# Patient Record
Sex: Male | Born: 1935
Health system: Southern US, Community
[De-identification: ages and names within clinical notes are randomized; demographics above are authoritative.]

## PROBLEM LIST (undated history)

## (undated) DIAGNOSIS — E785 Hyperlipidemia, unspecified: Secondary | ICD-10-CM

## (undated) DIAGNOSIS — I2510 Atherosclerotic heart disease of native coronary artery without angina pectoris: Secondary | ICD-10-CM

## (undated) DIAGNOSIS — I255 Ischemic cardiomyopathy: Secondary | ICD-10-CM

## (undated) DIAGNOSIS — I350 Nonrheumatic aortic (valve) stenosis: Secondary | ICD-10-CM

## (undated) DIAGNOSIS — K219 Gastro-esophageal reflux disease without esophagitis: Secondary | ICD-10-CM

## (undated) DIAGNOSIS — I2699 Other pulmonary embolism without acute cor pulmonale: Secondary | ICD-10-CM

## (undated) DIAGNOSIS — R05 Cough: Secondary | ICD-10-CM

## (undated) DIAGNOSIS — K222 Esophageal obstruction: Secondary | ICD-10-CM

## (undated) DIAGNOSIS — M1990 Unspecified osteoarthritis, unspecified site: Secondary | ICD-10-CM

## (undated) DIAGNOSIS — K5792 Diverticulitis of intestine, part unspecified, without perforation or abscess without bleeding: Secondary | ICD-10-CM

## (undated) DIAGNOSIS — I219 Acute myocardial infarction, unspecified: Secondary | ICD-10-CM

## (undated) DIAGNOSIS — I4891 Unspecified atrial fibrillation: Secondary | ICD-10-CM

## (undated) DIAGNOSIS — C61 Malignant neoplasm of prostate: Secondary | ICD-10-CM

## (undated) DIAGNOSIS — I472 Ventricular tachycardia, unspecified: Secondary | ICD-10-CM

## (undated) DIAGNOSIS — R011 Cardiac murmur, unspecified: Secondary | ICD-10-CM

## (undated) DIAGNOSIS — I4821 Permanent atrial fibrillation: Secondary | ICD-10-CM

## (undated) DIAGNOSIS — G959 Disease of spinal cord, unspecified: Secondary | ICD-10-CM

## (undated) DIAGNOSIS — I509 Heart failure, unspecified: Secondary | ICD-10-CM

## (undated) DIAGNOSIS — R54 Age-related physical debility: Secondary | ICD-10-CM

## (undated) DIAGNOSIS — R059 Cough, unspecified: Secondary | ICD-10-CM

## (undated) DIAGNOSIS — M810 Age-related osteoporosis without current pathological fracture: Secondary | ICD-10-CM

## (undated) DIAGNOSIS — I639 Cerebral infarction, unspecified: Secondary | ICD-10-CM

## (undated) DIAGNOSIS — Z95810 Presence of automatic (implantable) cardiac defibrillator: Secondary | ICD-10-CM

## (undated) DIAGNOSIS — Z7901 Long term (current) use of anticoagulants: Secondary | ICD-10-CM

## (undated) DIAGNOSIS — I5022 Chronic systolic (congestive) heart failure: Secondary | ICD-10-CM

## (undated) DIAGNOSIS — T17908A Unspecified foreign body in respiratory tract, part unspecified causing other injury, initial encounter: Secondary | ICD-10-CM

## (undated) DIAGNOSIS — E871 Hypo-osmolality and hyponatremia: Secondary | ICD-10-CM

## (undated) HISTORY — PX: BACK SURGERY: SHX140

## (undated) HISTORY — DX: Unspecified osteoarthritis, unspecified site: M19.90

## (undated) HISTORY — DX: Nonrheumatic aortic (valve) stenosis: I35.0

## (undated) HISTORY — PX: OTHER SURGICAL HISTORY: SHX169

## (undated) HISTORY — DX: Other pulmonary embolism without acute cor pulmonale: I26.99

## (undated) HISTORY — DX: Gastro-esophageal reflux disease without esophagitis: K21.9

## (undated) HISTORY — DX: Ischemic cardiomyopathy: I25.5

## (undated) HISTORY — PX: AORTIC VALVE REPLACEMENT: SHX41

## (undated) HISTORY — DX: Acute myocardial infarction, unspecified: I21.9

## (undated) HISTORY — DX: Long term (current) use of anticoagulants: Z79.01

## (undated) HISTORY — PX: CHOLECYSTECTOMY: SHX55

## (undated) HISTORY — DX: Hyperlipidemia, unspecified: E78.5

## (undated) HISTORY — DX: Disease of spinal cord, unspecified: G95.9

## (undated) HISTORY — DX: Esophageal obstruction: K22.2

## (undated) HISTORY — DX: Unspecified atrial fibrillation: I48.91

## (undated) HISTORY — DX: Malignant neoplasm of prostate: C61

## (undated) HISTORY — DX: Ventricular tachycardia: I47.2

## (undated) HISTORY — DX: Diverticulitis of intestine, part unspecified, without perforation or abscess without bleeding: K57.92

## (undated) HISTORY — PX: SHOULDER SURGERY: SHX246

---
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
## 1977-03-13 HISTORY — PX: CORONARY ARTERY BYPASS GRAFT: SHX141

## 1989-03-13 HISTORY — PX: CORONARY ARTERY BYPASS GRAFT: SHX141

## 1997-09-29 ENCOUNTER — Other Ambulatory Visit: Admission: RE | Admit: 1997-09-29 | Discharge: 1997-09-29 | Payer: Self-pay | Admitting: Oncology

## 1997-11-05 ENCOUNTER — Ambulatory Visit (HOSPITAL_BASED_OUTPATIENT_CLINIC_OR_DEPARTMENT_OTHER): Admission: RE | Admit: 1997-11-05 | Discharge: 1997-11-05 | Payer: Self-pay | Admitting: Orthopedic Surgery

## 1998-03-25 ENCOUNTER — Ambulatory Visit (HOSPITAL_BASED_OUTPATIENT_CLINIC_OR_DEPARTMENT_OTHER): Admission: RE | Admit: 1998-03-25 | Discharge: 1998-03-25 | Payer: Self-pay | Admitting: Orthopedic Surgery

## 2001-04-12 ENCOUNTER — Ambulatory Visit (HOSPITAL_COMMUNITY): Admission: RE | Admit: 2001-04-12 | Discharge: 2001-04-12 | Payer: Self-pay | Admitting: Cardiology

## 2001-05-02 ENCOUNTER — Ambulatory Visit (HOSPITAL_COMMUNITY): Admission: RE | Admit: 2001-05-02 | Discharge: 2001-05-03 | Payer: Self-pay | Admitting: Internal Medicine

## 2001-05-03 ENCOUNTER — Encounter: Payer: Self-pay | Admitting: Internal Medicine

## 2003-07-06 ENCOUNTER — Inpatient Hospital Stay (HOSPITAL_COMMUNITY): Admission: RE | Admit: 2003-07-06 | Discharge: 2003-07-06 | Payer: Self-pay | Admitting: Internal Medicine

## 2004-02-19 ENCOUNTER — Encounter: Admission: RE | Admit: 2004-02-19 | Discharge: 2004-02-19 | Payer: Self-pay | Admitting: Sports Medicine

## 2004-03-17 ENCOUNTER — Ambulatory Visit: Payer: Self-pay | Admitting: Internal Medicine

## 2004-08-24 ENCOUNTER — Ambulatory Visit: Payer: Self-pay

## 2004-11-22 ENCOUNTER — Ambulatory Visit: Payer: Self-pay | Admitting: Internal Medicine

## 2004-11-29 ENCOUNTER — Ambulatory Visit (HOSPITAL_COMMUNITY): Admission: RE | Admit: 2004-11-29 | Discharge: 2004-11-29 | Payer: Self-pay | Admitting: Orthopedic Surgery

## 2005-02-07 ENCOUNTER — Ambulatory Visit: Payer: Self-pay | Admitting: Internal Medicine

## 2005-05-16 ENCOUNTER — Ambulatory Visit: Payer: Self-pay | Admitting: Internal Medicine

## 2005-08-17 ENCOUNTER — Ambulatory Visit: Payer: Self-pay

## 2005-11-22 ENCOUNTER — Ambulatory Visit: Payer: Self-pay | Admitting: Internal Medicine

## 2006-01-31 ENCOUNTER — Ambulatory Visit: Payer: Self-pay | Admitting: Internal Medicine

## 2006-02-16 ENCOUNTER — Encounter: Admission: RE | Admit: 2006-02-16 | Discharge: 2006-02-16 | Payer: Self-pay | Admitting: Otolaryngology

## 2006-07-10 ENCOUNTER — Encounter: Admission: RE | Admit: 2006-07-10 | Discharge: 2006-07-10 | Payer: Self-pay | Admitting: Cardiology

## 2006-09-07 ENCOUNTER — Ambulatory Visit: Payer: Self-pay

## 2006-09-10 ENCOUNTER — Ambulatory Visit (HOSPITAL_COMMUNITY): Admission: RE | Admit: 2006-09-10 | Discharge: 2006-09-10 | Payer: Self-pay | Admitting: Urology

## 2006-10-02 ENCOUNTER — Ambulatory Visit: Admission: RE | Admit: 2006-10-02 | Discharge: 2006-12-28 | Payer: Self-pay | Admitting: Radiation Oncology

## 2006-11-16 LAB — URINALYSIS, MICROSCOPIC - CHCC
Bilirubin (Urine): NEGATIVE
Ketones: NEGATIVE mg/dL

## 2006-11-17 LAB — URINE CULTURE

## 2006-12-28 ENCOUNTER — Ambulatory Visit: Admission: RE | Admit: 2006-12-28 | Discharge: 2007-02-10 | Payer: Self-pay | Admitting: Radiation Oncology

## 2007-01-30 ENCOUNTER — Ambulatory Visit: Payer: Self-pay | Admitting: Internal Medicine

## 2007-04-30 ENCOUNTER — Ambulatory Visit: Payer: Self-pay | Admitting: Internal Medicine

## 2007-07-30 ENCOUNTER — Ambulatory Visit: Payer: Self-pay | Admitting: Internal Medicine

## 2007-09-02 ENCOUNTER — Inpatient Hospital Stay (HOSPITAL_COMMUNITY): Admission: AD | Admit: 2007-09-02 | Discharge: 2007-09-05 | Payer: Self-pay | Admitting: Cardiology

## 2007-10-29 ENCOUNTER — Ambulatory Visit: Payer: Self-pay | Admitting: Internal Medicine

## 2007-11-03 ENCOUNTER — Inpatient Hospital Stay (HOSPITAL_COMMUNITY): Admission: EM | Admit: 2007-11-03 | Discharge: 2007-11-05 | Payer: Self-pay | Admitting: Emergency Medicine

## 2007-12-30 ENCOUNTER — Inpatient Hospital Stay (HOSPITAL_COMMUNITY): Admission: EM | Admit: 2007-12-30 | Discharge: 2008-01-02 | Payer: Self-pay | Admitting: Emergency Medicine

## 2007-12-30 ENCOUNTER — Ambulatory Visit: Payer: Self-pay | Admitting: *Deleted

## 2008-01-14 ENCOUNTER — Ambulatory Visit: Payer: Self-pay | Admitting: Internal Medicine

## 2008-03-13 HISTORY — PX: CARDIAC CATHETERIZATION: SHX172

## 2008-04-14 ENCOUNTER — Ambulatory Visit: Payer: Self-pay | Admitting: Internal Medicine

## 2008-05-22 ENCOUNTER — Encounter: Payer: Self-pay | Admitting: Internal Medicine

## 2008-07-14 ENCOUNTER — Ambulatory Visit: Payer: Self-pay | Admitting: Internal Medicine

## 2008-07-30 ENCOUNTER — Ambulatory Visit: Payer: Self-pay | Admitting: Oncology

## 2008-07-31 ENCOUNTER — Encounter: Payer: Self-pay | Admitting: Internal Medicine

## 2008-08-12 LAB — COMPREHENSIVE METABOLIC PANEL
BUN: 22 mg/dL (ref 6–23)
CO2: 25 mEq/L (ref 19–32)
Calcium: 8.8 mg/dL (ref 8.4–10.5)
Chloride: 100 mEq/L (ref 96–112)
Creatinine, Ser: 1.2 mg/dL (ref 0.40–1.50)
Glucose, Bld: 100 mg/dL — ABNORMAL HIGH (ref 70–99)

## 2008-08-12 LAB — CBC WITH DIFFERENTIAL/PLATELET
Basophils Absolute: 0 10*3/uL (ref 0.0–0.1)
HCT: 43.4 % (ref 38.4–49.9)
HGB: 14.7 g/dL (ref 13.0–17.1)
MCH: 32 pg (ref 27.2–33.4)
MONO#: 0.4 10*3/uL (ref 0.1–0.9)
NEUT%: 76.7 % — ABNORMAL HIGH (ref 39.0–75.0)
WBC: 6.4 10*3/uL (ref 4.0–10.3)
lymph#: 0.9 10*3/uL (ref 0.9–3.3)

## 2008-08-12 LAB — PSA: PSA: 0.62 ng/mL (ref 0.10–4.00)

## 2008-08-13 ENCOUNTER — Ambulatory Visit: Payer: Self-pay | Admitting: Surgery

## 2008-09-18 ENCOUNTER — Emergency Department (HOSPITAL_COMMUNITY): Admission: EM | Admit: 2008-09-18 | Discharge: 2008-09-18 | Payer: Self-pay | Admitting: Emergency Medicine

## 2008-10-13 ENCOUNTER — Ambulatory Visit: Payer: Self-pay | Admitting: Internal Medicine

## 2008-10-20 ENCOUNTER — Encounter: Payer: Self-pay | Admitting: Internal Medicine

## 2008-11-25 ENCOUNTER — Ambulatory Visit (HOSPITAL_COMMUNITY): Admission: RE | Admit: 2008-11-25 | Discharge: 2008-11-25 | Payer: Self-pay | Admitting: Cardiology

## 2008-11-30 ENCOUNTER — Ambulatory Visit (HOSPITAL_COMMUNITY): Admission: RE | Admit: 2008-11-30 | Discharge: 2008-11-30 | Payer: Self-pay | Admitting: Cardiology

## 2008-12-21 ENCOUNTER — Encounter: Admission: RE | Admit: 2008-12-21 | Discharge: 2008-12-21 | Payer: Self-pay | Admitting: Cardiology

## 2009-01-15 ENCOUNTER — Ambulatory Visit: Payer: Self-pay | Admitting: Internal Medicine

## 2009-01-15 DIAGNOSIS — I4891 Unspecified atrial fibrillation: Secondary | ICD-10-CM | POA: Insufficient documentation

## 2009-01-15 DIAGNOSIS — I472 Ventricular tachycardia, unspecified: Secondary | ICD-10-CM | POA: Insufficient documentation

## 2009-03-19 ENCOUNTER — Encounter: Admission: RE | Admit: 2009-03-19 | Discharge: 2009-03-19 | Payer: Self-pay | Admitting: Gastroenterology

## 2009-04-20 ENCOUNTER — Ambulatory Visit: Payer: Self-pay | Admitting: Internal Medicine

## 2009-05-07 ENCOUNTER — Encounter: Payer: Self-pay | Admitting: Internal Medicine

## 2009-06-21 ENCOUNTER — Encounter: Admission: RE | Admit: 2009-06-21 | Discharge: 2009-06-21 | Payer: Self-pay | Admitting: Cardiology

## 2009-07-20 ENCOUNTER — Ambulatory Visit: Payer: Self-pay | Admitting: Internal Medicine

## 2009-07-27 ENCOUNTER — Encounter: Payer: Self-pay | Admitting: Internal Medicine

## 2009-10-21 ENCOUNTER — Ambulatory Visit: Payer: Self-pay | Admitting: Internal Medicine

## 2009-11-01 ENCOUNTER — Encounter: Payer: Self-pay | Admitting: Internal Medicine

## 2009-11-03 ENCOUNTER — Ambulatory Visit: Payer: Self-pay | Admitting: Cardiology

## 2009-12-02 ENCOUNTER — Ambulatory Visit: Payer: Self-pay | Admitting: Cardiology

## 2009-12-17 ENCOUNTER — Ambulatory Visit: Payer: Self-pay | Admitting: Cardiology

## 2009-12-27 ENCOUNTER — Ambulatory Visit: Payer: Self-pay | Admitting: Cardiology

## 2009-12-27 ENCOUNTER — Encounter: Payer: Self-pay | Admitting: Cardiology

## 2009-12-27 ENCOUNTER — Ambulatory Visit: Payer: Self-pay

## 2009-12-27 ENCOUNTER — Encounter: Payer: Self-pay | Admitting: Cardiovascular Disease

## 2009-12-27 ENCOUNTER — Ambulatory Visit (HOSPITAL_COMMUNITY): Admission: RE | Admit: 2009-12-27 | Discharge: 2009-12-27 | Payer: Self-pay | Admitting: Cardiology

## 2010-01-11 ENCOUNTER — Ambulatory Visit: Payer: Self-pay | Admitting: Cardiothoracic Surgery

## 2010-01-14 ENCOUNTER — Ambulatory Visit: Payer: Self-pay | Admitting: Oncology

## 2010-01-14 ENCOUNTER — Encounter: Admission: RE | Admit: 2010-01-14 | Discharge: 2010-01-14 | Payer: Self-pay | Admitting: Gastroenterology

## 2010-01-17 LAB — CBC WITH DIFFERENTIAL/PLATELET
BASO%: 0.5 % (ref 0.0–2.0)
EOS%: 2.9 % (ref 0.0–7.0)
Eosinophils Absolute: 0.2 10*3/uL (ref 0.0–0.5)
HCT: 41.7 % (ref 38.4–49.9)
LYMPH%: 18.1 % (ref 14.0–49.0)
MCH: 31.2 pg (ref 27.2–33.4)
NEUT%: 70 % (ref 39.0–75.0)
RBC: 4.57 10*6/uL (ref 4.20–5.82)
lymph#: 1 10*3/uL (ref 0.9–3.3)

## 2010-01-17 LAB — MORPHOLOGY: PLT EST: ADEQUATE

## 2010-01-17 LAB — PROTHROMBIN TIME: INR: 2.39 — ABNORMAL HIGH (ref <1.50)

## 2010-01-18 LAB — BETA-2 GLYCOPROTEIN ANTIBODIES
Beta-2 Glyco I IgG: 13 G Units (ref <20)
Beta-2-Glycoprotein I IgA: 12 A Units (ref <20)

## 2010-01-18 LAB — LUPUS ANTICOAGULANT PANEL
DRVVT 1:1 Mix: 43.2 secs (ref 36.2–44.3)
DRVVT: 57.2 secs — ABNORMAL HIGH (ref 36.2–44.3)
PTT Lupus Anticoagulant: 50.8 secs — ABNORMAL HIGH (ref 30.0–45.6)

## 2010-01-18 LAB — CARDIOLIPIN ANTIBODIES, IGG, IGM, IGA: Anticardiolipin IgA: 7 APL U/mL (ref <22)

## 2010-01-19 LAB — CHROMOGENIC FACTOR X: CHROM XA: 22.2 % — ABNORMAL LOW (ref 86–146)

## 2010-02-01 ENCOUNTER — Ambulatory Visit: Payer: Self-pay | Admitting: Internal Medicine

## 2010-02-07 ENCOUNTER — Ambulatory Visit: Payer: Self-pay

## 2010-02-07 ENCOUNTER — Encounter: Payer: Self-pay | Admitting: Cardiology

## 2010-02-07 DIAGNOSIS — R0989 Other specified symptoms and signs involving the circulatory and respiratory systems: Secondary | ICD-10-CM

## 2010-02-08 ENCOUNTER — Telehealth (INDEPENDENT_AMBULATORY_CARE_PROVIDER_SITE_OTHER): Payer: Self-pay | Admitting: Radiology

## 2010-02-09 ENCOUNTER — Encounter: Payer: Self-pay | Admitting: Cardiology

## 2010-02-09 ENCOUNTER — Encounter (HOSPITAL_COMMUNITY)
Admission: RE | Admit: 2010-02-09 | Discharge: 2010-04-12 | Payer: Self-pay | Source: Home / Self Care | Attending: Cardiology | Admitting: Cardiology

## 2010-02-09 ENCOUNTER — Ambulatory Visit: Payer: Self-pay | Admitting: Cardiology

## 2010-02-09 ENCOUNTER — Ambulatory Visit: Payer: Self-pay

## 2010-02-09 LAB — HEPARIN INDUCED THROMBOCYTOPENIA PNL: UFH Low Dose,0.5: 0 % Release

## 2010-02-09 LAB — PROTHROMBIN GENE MUTATION

## 2010-02-10 ENCOUNTER — Ambulatory Visit (HOSPITAL_COMMUNITY)
Admission: RE | Admit: 2010-02-10 | Discharge: 2010-02-10 | Payer: Self-pay | Source: Home / Self Care | Admitting: Cardiology

## 2010-02-10 LAB — PULMONARY FUNCTION TEST

## 2010-02-14 ENCOUNTER — Ambulatory Visit: Payer: Self-pay | Admitting: Oncology

## 2010-02-17 ENCOUNTER — Inpatient Hospital Stay (HOSPITAL_COMMUNITY)
Admission: EM | Admit: 2010-02-17 | Discharge: 2010-02-19 | Payer: Self-pay | Source: Home / Self Care | Attending: Cardiovascular Disease | Admitting: Cardiovascular Disease

## 2010-02-21 ENCOUNTER — Ambulatory Visit: Payer: Self-pay | Admitting: Cardiology

## 2010-02-28 ENCOUNTER — Ambulatory Visit: Payer: Self-pay | Admitting: Cardiology

## 2010-03-10 ENCOUNTER — Ambulatory Visit: Payer: Self-pay | Admitting: Cardiology

## 2010-03-31 ENCOUNTER — Ambulatory Visit: Payer: Self-pay | Admitting: Cardiology

## 2010-04-03 ENCOUNTER — Encounter: Payer: Self-pay | Admitting: Gastroenterology

## 2010-04-03 ENCOUNTER — Encounter: Payer: Self-pay | Admitting: Cardiology

## 2010-04-12 NOTE — Letter (Signed)
Summary: Remote Device Check  [employer], Main Office  [address]   [place], [place] [address]   Phone: [phone]  Fax: [phone]     May 07, 2009 MRN: [id number]   [facility] [address] [place], [place]  [address]   Dear Mr. Mcquaid,   Your remote transmission was recieved and reviewed by your physician.  All diagnostics were within normal limits for you.  __X___Your next transmission is scheduled for:  Jul 20, 2009.  Please transmit at any time this day.  If you have a wireless device your transmission will be sent automatically.      Sincerely,  Proofreader

## 2010-04-12 NOTE — Letter (Signed)
Summary: Remote Device Check  [employer], Main Office  [address]   [place], [place] [address]   Phone: [phone]  Fax: [phone]     Jul 27, 2009 MRN: [id number]   [facility] [address] [place], [place]  [address]   Dear Mr. Mader,   Your remote transmission was recieved and reviewed by your physician.  All diagnostics were within normal limits for you.  __X___Your next transmission is scheduled for:   10-21-2009.  Please transmit at any time this day.  If you have a wireless device your transmission will be sent automatically.   Sincerely,  Vella Kohler

## 2010-04-12 NOTE — Progress Notes (Signed)
Summary: nuc pre-procedure  Phone Note Outgoing Call   Call placed by: Harlow Asa CNMT Call placed to: Patient Reason for Call: Confirm/change Appt Summary of Call: Reviewed information on Myoview Information Sheet (see scanned document for further details).  Spoke with patient.      Nuclear Med Background Indications for Stress Test: Evaluation for Ischemia, Graft Patency   History: CABG, Cardioversion, Defibrillator, Echo, Heart Catheterization, Myocardial Infarction  History Comments: ' ; '79CABG ; '03 defibrillator; '08 Echo severe LV & Aortis Dysf. EF=15%; '09 cardioversion; '10 Cath   Symptoms: DOE, Fatigue    Nuclear Pre-Procedure Cardiac Risk Factors: Family History - CAD, History of Smoking, Hypertension, Lipids Height (in): 76  Nuclear Med Study Referring MD:  Deborah Chalk

## 2010-04-12 NOTE — Cardiovascular Report (Signed)
Summary: Office Visit Remote   Office Visit Remote   Imported By: Roderic Ovens 05/07/2009 13:53:00  _____________________________________________________________________  External Attachment:    Type:   Image     Comment:   External Document

## 2010-04-12 NOTE — Miscellaneous (Signed)
Summary: Orders Update  Clinical Lists Changes  Problems: Added new problem of OTHER SYMPTOMS INVOLVING CARDIOVASCULAR SYSTEM (ICD-785.9) Orders: Added new Test order of Carotid Duplex (Carotid Duplex) - Signed 

## 2010-04-12 NOTE — Cardiovascular Report (Signed)
Summary: Office Visit Remote   Office Visit Remote   Imported By: Roderic Ovens 11/02/2009 15:48:11  _____________________________________________________________________  External Attachment:    Type:   Image     Comment:   External Document

## 2010-04-12 NOTE — Assessment & Plan Note (Signed)
Summary: Cardiology Nuclear Testing  Nuclear Med Background Indications for Stress Test: Evaluation for Ischemia, Graft Patency   History: CABG, Cardioversion, Defibrillator, Echo, Heart Catheterization, Myocardial Infarction  History Comments: ' ; '79CABG ; '03 defibrillator; '08 Echo severe LV & Aortis Dysf. EF=15%; '09 cardioversion; '10 Cath   Symptoms: DOE, Fatigue, Palpitations, SOB    Nuclear Pre-Procedure Cardiac Risk Factors: Family History - CAD, History of Smoking, Hypertension, Lipids Caffeine/Decaff Intake: None NPO After: 7:00 AM Lungs: clear IV 0.9% NS with Angio Cath: 22g     IV Site: R Hand IV Started by: Bonnita Levan, RN Chest Size (in) 44     Height (in): 76 Weight (lb): 209 BMI: 25.53  Nuclear Med Study 1 or 2 day study:  1 day     Stress Test Type:  Eugenie Birks Reading MD:  Marca Ancona, MD     Referring MD:  TENNANT Resting Radionuclide:  Technetium 16m Tetrofosmin     Resting Radionuclide Dose:  11 mCi  Stress Radionuclide:  Technetium 75m Tetrofosmin     Stress Radionuclide Dose:  33 mCi   Stress Protocol   Lexiscan: 0.4 mg   Stress Test Technologist:  Frederick Peers, EMT-P     Nuclear Technologist:  Doyne Keel, CNMT  Rest Procedure  Myocardial perfusion imaging was performed at rest 45 minutes following the intravenous administration of Technetium 58m Tetrofosmin.  Stress Procedure  The patient received IV Lexiscan 0.4 mg over 15-seconds.  Technetium 23m Tetrofosmin injected at 30-seconds.  There were no significant changes with infusion./occ PVCs.  Quantitative spect images were obtained after a 45 minute delay.  QPS Raw Data Images:  Normal; no motion artifact; normal heart/lung ratio. Stress Images:  Severe mid to apical anterior perfusion defect.  Large severe inferior perfusion defect.  Rest Images:  Severe mid to apical anterior perfusion defect.  Large severe inferior perfusion defect.  Subtraction (SDS):  Fixed severe mid to apical  anterior perfusion defect and fixed severe inferior perfusion defect.  Transient Ischemic Dilatation:  1.04  (Normal <1.22)  Lung/Heart Ratio:  0.39  (Normal <0.45)  Quantitative Gated Spect Images QGS cine images:  Nongated study   Overall Impression  Exercise Capacity: Lexiscan with no exercise. BP Response: Normal blood pressure response. Clinical Symptoms: Short of breath.  ECG Impression: Atrial fibrillation without significant change with infusion.  Overall Impression: Large, severe fixed inferior perfusion defect.  Severe mid to apical anterior perfusion defect.  Dilated LV.  Overall Impression Comments: Infarction with minimal evidence for ischemia.   Appended Document: Cardiology Nuclear Testing copy sent to dr.tennant

## 2010-04-12 NOTE — Assessment & Plan Note (Signed)
Summary: medtronic/saf   Visit Type:  ICD Medtronic  CC:  shortness of breath.  History of Present Illness: [name] is seen in followup for ischemic cardiomyopathy for which he underwent ICD implantation for primary prevention. Hehas  had appropriate therapy for ventricular tachycardia.  He is status post bypass surgery.  He also has a history of atrial fibrillation for which amiodarone was initiated. He was intolerant of this and this has been discontinued. He is doing better. 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 Take As Directed 2)  Synthroid 150 Mcg Tabs (Levothyroxine Sodium) .... Once Daily 3)  Coreg 3.125 Mg Tabs (Carvedilol) .... Two Times A Day 4)  Lipitor 10 Mg Tabs (Atorvastatin Calcium) .... Once Daily 5)  Prilosec 20 Mg Cpdr (Omeprazole) .... Once Daily 6)  Uroxatral 10 Mg Xr24h-Tab (Alfuzosin Hcl) .... Once Daily 7)  Centrum Silver  Tabs (Multiple Vitamins-Minerals) .... Once Daily 8)  Reglan 5 Mg Tabs (Metoclopramide Hcl) .Marland Kitchen.. 1 Tablet Three Times A Day 9)  Metamucil 0.52 Gm Caps (Psyllium) .... As Needed  Allergies (verified): 1)  ! * Heparin Antibodies 2)  ! Pcn  Past History:  Past Medical History: Last updated: 01/14/2009  Ischemic cardiomyopathy-previous coronary artery       bypass History of ICD implantation performed in 2005-Medtronic Marquis 7232 Chronic Coumadin anticoagulation Hyperlipidemia History of myopathy Past history of pulmonary emboli History of prostate cancer status post  radiation in 2008 GERD Hypothyroidism History of hypertension Previous history of a stroke Cholecystectomy Bilateral shoulder surgery History of esophageal dilatation Coagulopathy.  He is negative for factor V Leiden but has a history       of an antiphospholipid antibody Chronic constipation  Atrial fibrillation Severe aortic stenosis  Past Surgical History: Last updated: 01/14/2009 Pacemaker implant- Medtronic Marquis 7232  Social History: Last updated: 01/14/2009 Retired  Married  Tobacco Use - Former.  Alcohol Use - yes-occasional  Risk Factors: Smoking Status: quit (01/14/2009)  Vital Signs:  Patient profile:   77 year old male Height:      76 inches Weight:      219.25 pounds BMI:     26.78 Pulse rate:   83 / minute BP sitting:   110 / 68  (left arm) Cuff size:   regular  Vitals Entered By: Caralee Ates CMA (February 01, 2010 9:37 AM)  Physical Exam  General:  The patient was alert and oriented in no acute distress. HEENT Normal.  Neck veins were flat, carotids were brisk.  Lungs were clear.  Heart sounds were regular with2/6 murmurs or gallops.  Abdomen was soft with active bowel sounds. There is no clubbing cyanosis or edema. Skin Warm and dry     ICD Specifications Following MD:  [name], MD     ICD Vendor:  Medtronic     ICD Model Number:  7232     ICD Serial Number:  [id number] ICD DOI:  07/06/2003     ICD Implanting MD:  [name], MD  Lead 1:    Location: RV  DOI: 07/06/2003     Model #: 7035     Serial #: [id number]     Status: active  Indications::  VT   ICD Follow Up Battery Voltage:  2.99 V     Charge Time:  7.46 seconds     Underlying rhythm:  SR   ICD Device Measurements Right Ventricle:  Amplitude: 16.8 mV, Impedance: 664 ohms, Threshold: 1.0 V at 0.3 msec Shock Impedance: 50/62 ohms   Episodes MS Episodes:  0     Coumadin:  Yes Shock:  0     ATP:  2     Nonsustained:  290     Atrial Therapies:  0 Ventricular  Pacing:  0.5%  Brady Parameters Mode VVI     Lower Rate Limit:  40      Tachy Zones VF:  200     VT:  240     VT1:  150     Next Remote Date:  05/05/2010     Next Cardiology Appt Due:  01/12/2011 Tech Comments:  2 VT EPISODES THAT WERE SUCCESSFULLY PACE TERMINATED.  NORMAL DEVICE FUNCTION.  NO CHANGES MADE.  DEMONSTRATED TONES FOR PT BUT UNABLE TO HEAR THEM DUE TO HEARING PROBLEM.  CARELINK TRANSMISSION 05-05-10 AND ROV IN 12 MTHS W/SK. Vella Kohler  February 01, 2010 9:46 AM  Impression & Recommendations:  Problem # 1:  AORTIC VALVE STENOSIS (ICD-424.1) Assessment New as above may go to [facility] for further eval His updated medication list for this problem includes:    Coreg 3.125 Mg Tabs (Carvedilol) .Marland Kitchen..Marland Kitchen Two times a day  Problem # 2:  CARDIOMYOPATHY, ISCHEMIC S/P CABG 1991 PATENT EF 25% (ICD-414.8) with chf  Is he candidate for digoxin or aldactone His updated medication list for this problem includes:    Coumadin 5 Mg Tabs (Warfarin sodium) .Marland Kitchen... Take as directed    Coreg 3.125 Mg Tabs (Carvedilol) .Marland Kitchen..Marland Kitchen Two times a day  Problem # 3:  ATRIAL FIBRILLATION (ICD-427.31) now persistent.  Consider catheter ablation or MAZE if surgery is opton for valve His updated medication list for this problem includes:    Coumadin 5 Mg Tabs (Warfarin sodium) .Marland Kitchen... Take as directed    Coreg 3.125 Mg Tabs (Carvedilol) .Marland Kitchen..Marland Kitchen Two times a day  Problem # 4:  AUTOMATIC IMPLANTABLE CARDIAC DEFIBRILLATOR SITU (ICD-V45.02) Device parameters and data were reviewed and no changes were made

## 2010-04-12 NOTE — Cardiovascular Report (Signed)
Summary: Office Visit Remote   Office Visit Remote   Imported By: Roderic Ovens 07/28/2009 10:58:04  _____________________________________________________________________  External Attachment:    Type:   Image     Comment:   External Document

## 2010-04-12 NOTE — Letter (Signed)
Summary: Remote Device Check  [employer], Main Office  [address]   [place], [place] [address]   Phone: [phone]  Fax: [phone]     November 01, 2009 MRN: [id number]   [facility] [address] [place], [place]  [address]   Dear Mr. Daywalt,   Your remote transmission was recieved and reviewed by your physician.  All diagnostics were within normal limits for you.   __X____Your next office visit is scheduled for: November with Dr Graciela Husbands. Please call our office to schedule an appointment.    Sincerely,  Vella Kohler

## 2010-05-03 ENCOUNTER — Ambulatory Visit (INDEPENDENT_AMBULATORY_CARE_PROVIDER_SITE_OTHER): Payer: Medicare Other | Admitting: Cardiology

## 2010-05-03 DIAGNOSIS — I2510 Atherosclerotic heart disease of native coronary artery without angina pectoris: Secondary | ICD-10-CM

## 2010-05-03 DIAGNOSIS — I359 Nonrheumatic aortic valve disorder, unspecified: Secondary | ICD-10-CM

## 2010-05-05 ENCOUNTER — Encounter (INDEPENDENT_AMBULATORY_CARE_PROVIDER_SITE_OTHER): Payer: Medicare Other

## 2010-05-05 DIAGNOSIS — I428 Other cardiomyopathies: Secondary | ICD-10-CM

## 2010-05-06 ENCOUNTER — Encounter: Payer: Self-pay | Admitting: Internal Medicine

## 2010-05-12 DIAGNOSIS — I255 Ischemic cardiomyopathy: Secondary | ICD-10-CM

## 2010-05-12 HISTORY — DX: Ischemic cardiomyopathy: I25.5

## 2010-05-23 LAB — BASIC METABOLIC PANEL
BUN: 15 mg/dL (ref 6–23)
Calcium: 8.6 mg/dL (ref 8.4–10.5)
Calcium: 8.7 mg/dL (ref 8.4–10.5)
Creatinine, Ser: 1.15 mg/dL (ref 0.4–1.5)
GFR calc Af Amer: >60 mL/min (ref >60)
GFR calc non Af Amer: >60 mL/min (ref >60)
GFR calc non Af Amer: >60 mL/min (ref >60)
Glucose, Bld: 96 mg/dL (ref 70–99)
Potassium: 3.9 mEq/L (ref 3.5–5.1)
Sodium: 138 mEq/L (ref 135–145)

## 2010-05-23 LAB — URINALYSIS, ROUTINE W REFLEX MICROSCOPIC
Glucose, UA: NEGATIVE mg/dL
Ketones, ur: NEGATIVE mg/dL
Nitrite: NEGATIVE
Protein, ur: NEGATIVE mg/dL
Specific Gravity, Urine: 1.009 (ref 1.005–1.030)
Urobilinogen, UA: 0.2 mg/dL (ref 0.0–1.0)
pH: 6.5 (ref 5.0–8.0)

## 2010-05-23 LAB — CBC
MCH: 30.2 pg (ref 26.0–34.0)
MCV: 90.7 fL (ref 78.0–100.0)
Platelets: 173 10*3/uL (ref 150–400)
RDW: 13.5 % (ref 11.5–15.5)
WBC: 6.4 10*3/uL (ref 4.0–10.5)

## 2010-05-23 LAB — POCT CARDIAC MARKERS
CKMB, poc: 3.6 ng/mL (ref 1.0–8.0)
Myoglobin, poc: 159 ng/mL (ref 12–200)

## 2010-05-23 LAB — URINE CULTURE
Colony Count: NO GROWTH
Culture  Setup Time: 201112081848

## 2010-05-23 LAB — DIFFERENTIAL
Lymphocytes Relative: 15 % (ref 12–46)
Monocytes Absolute: 0.6 10*3/uL (ref 0.1–1.0)
Monocytes Relative: 9 % (ref 3–12)
Neutro Abs: 4.6 10*3/uL (ref 1.7–7.7)

## 2010-05-23 LAB — COMPREHENSIVE METABOLIC PANEL
Albumin: 3.8 g/dL (ref 3.5–5.2)
BUN: 10 mg/dL (ref 6–23)
Creatinine, Ser: 1.14 mg/dL (ref 0.4–1.5)
Potassium: 4.2 mEq/L (ref 3.5–5.1)
Total Protein: 6.4 g/dL (ref 6.0–8.3)

## 2010-05-23 LAB — CK TOTAL AND CKMB (NOT AT ARMC)
CK, MB: 2.3 ng/mL (ref 0.3–4.0)
CK, MB: 3.5 ng/mL (ref 0.3–4.0)
Total CK: 70 U/L (ref 7–232)
Total CK: 99 U/L (ref 7–232)

## 2010-05-23 LAB — BRAIN NATRIURETIC PEPTIDE: Pro B Natriuretic peptide (BNP): 379 pg/mL — ABNORMAL HIGH (ref 0.0–100.0)

## 2010-05-23 LAB — APTT: aPTT: 39 seconds — ABNORMAL HIGH (ref 24–37)

## 2010-05-23 LAB — PROTIME-INR
INR: 2.3 — ABNORMAL HIGH (ref 0.00–1.49)
Prothrombin Time: 24.2 seconds — ABNORMAL HIGH (ref 11.6–15.2)

## 2010-05-23 LAB — TROPONIN I
Troponin I: 0.01 ng/mL (ref 0.00–0.06)
Troponin I: 0.02 ng/mL (ref 0.00–0.06)

## 2010-05-30 ENCOUNTER — Encounter: Payer: Self-pay | Admitting: *Deleted

## 2010-05-30 ENCOUNTER — Ambulatory Visit (INDEPENDENT_AMBULATORY_CARE_PROVIDER_SITE_OTHER): Payer: Medicare Other | Admitting: Cardiology

## 2010-05-30 DIAGNOSIS — I2589 Other forms of chronic ischemic heart disease: Secondary | ICD-10-CM

## 2010-05-30 DIAGNOSIS — I4891 Unspecified atrial fibrillation: Secondary | ICD-10-CM

## 2010-05-31 ENCOUNTER — Telehealth: Payer: Self-pay | Admitting: *Deleted

## 2010-05-31 NOTE — Telephone Encounter (Signed)
RN received voicemail from patient that INR is 1.3.  Dr. Deborah Chalk notified and instructed RN to have pt continue same medications and recheck INR 06/01/10 and call with result.  Patient notified.

## 2010-06-02 ENCOUNTER — Ambulatory Visit (INDEPENDENT_AMBULATORY_CARE_PROVIDER_SITE_OTHER): Payer: Medicare Other | Admitting: *Deleted

## 2010-06-02 ENCOUNTER — Telehealth: Payer: Self-pay | Admitting: *Deleted

## 2010-06-02 ENCOUNTER — Other Ambulatory Visit: Payer: Self-pay | Admitting: Cardiology

## 2010-06-02 DIAGNOSIS — Z7901 Long term (current) use of anticoagulants: Secondary | ICD-10-CM

## 2010-06-02 LAB — PROTIME-INR
INR: 1.37 (ref <1.50)
Prothrombin Time: 16.7 seconds — ABNORMAL HIGH (ref 11.6–15.2)

## 2010-06-02 LAB — CBC WITH DIFFERENTIAL/PLATELET
MCH: 32.3 pg (ref 26.0–34.0)
MCHC: 35.1 g/dL (ref 30.0–36.0)
MCV: 92.1 fL (ref 78.0–100.0)
Platelets: 178 10*3/uL (ref 150–400)
RDW: 14 % (ref 11.5–15.5)
WBC: 5.6 10*3/uL (ref 4.0–10.5)

## 2010-06-02 NOTE — Telephone Encounter (Signed)
Pt called with INR result of 1.4 per [facility] Monitoring.  Pt is on Coumadin 5 mg normally, but took 7.5 mg yesterday per Dr. Ronnald Nian instructions.  Pt has been back on Coumadin since 05/27/10.  Also, pt is on Plavix 75 mg daily, Aspirin 81 mg daily, and Lovenox 100 mg SQ Q12 H following percutaneous aortic valve replacement.  Dr. Deborah Chalk notified and instructed RN to have pt take Coumadin 7.5 mg tonight and continue Lovenox.  Pt will call tomorrow with INR result.

## 2010-06-03 ENCOUNTER — Telehealth: Payer: Self-pay | Admitting: *Deleted

## 2010-06-03 NOTE — Telephone Encounter (Signed)
Per Dr Ronnald Nian phone call with Pt continue current dose, check inr daily. Coumadin 7.5 mg sat/sun stop lovenox with inr 2.0.

## 2010-06-06 ENCOUNTER — Telehealth: Payer: Self-pay | Admitting: *Deleted

## 2010-06-06 NOTE — Telephone Encounter (Signed)
Pt called, INR 2.1 per [facility] Monitoring.  Dr. Deborah Chalk notified and instructed RN to have pt take 5 mg of Coumadin tonight and stop Lovenox.  Pt notified of Dr. Ronnald Nian instructions and will call tomorrow with INR result.

## 2010-06-06 NOTE — Telephone Encounter (Signed)
Pt c/o right groin pain following recent percutaneous valve replacement surgery.  Pt feels very sore in right groin, especially when sitting. The soreness is better with walking.  No swelling or redness at right groin site.  Dr. Deborah Chalk notified and instructed RN to have pt apply ice to right goin.  Pt notified and will call back tomorrow with INR results and how groin is doing.

## 2010-06-07 ENCOUNTER — Other Ambulatory Visit: Payer: Self-pay | Admitting: Cardiology

## 2010-06-07 ENCOUNTER — Telehealth: Payer: Self-pay | Admitting: *Deleted

## 2010-06-07 ENCOUNTER — Encounter (INDEPENDENT_AMBULATORY_CARE_PROVIDER_SITE_OTHER): Payer: Medicare Other | Admitting: *Deleted

## 2010-06-07 DIAGNOSIS — M79609 Pain in unspecified limb: Secondary | ICD-10-CM

## 2010-06-07 DIAGNOSIS — R1030 Lower abdominal pain, unspecified: Secondary | ICD-10-CM

## 2010-06-07 NOTE — Telephone Encounter (Signed)
Pt called with INR of 2.0 per [facility] and is still c/o right groin pain.  No swelling or redness of right groin.  Norma Fredrickson NP notified and instructed RN to have pt stay on 5 mg of Coumadin daily and call back with INR result on Thursday.  Also, Lori instructed RN to set pt up for arterial doppler of right groin.  Pt notified of Lori's instructions and will have doppler today at 230pm.

## 2010-06-07 NOTE — Telephone Encounter (Signed)
Received a call from Harriett at [facility].   Right arterial doppler of groin is negative.  Pt notified by Harriett.

## 2010-06-09 NOTE — Letter (Signed)
Summary: Remote Device Check  [employer], Main Office  [address]   [place], [place] [address]   Phone: [phone]  Fax: [phone]     May 30, 2010 MRN: [id number]   [facility] [address] [place], [place]  [address]   Dear Mr. Crate,   Your remote transmission was recieved and reviewed by your physician.  All diagnostics were within normal limits for you.  __X___Your next transmission is scheduled for: 08-04-2010.  Please transmit at any time this day.  If you have a wireless device your transmission will be sent automatically.   Sincerely,  Vella Kohler

## 2010-06-09 NOTE — Cardiovascular Report (Signed)
Summary: Office Visit   Office Visit   Imported By: Roderic Ovens 06/01/2010 09:38:33  _____________________________________________________________________  External Attachment:    Type:   Image     Comment:   External Document

## 2010-06-16 ENCOUNTER — Encounter: Payer: Self-pay | Admitting: Cardiology

## 2010-06-16 ENCOUNTER — Ambulatory Visit (INDEPENDENT_AMBULATORY_CARE_PROVIDER_SITE_OTHER): Payer: Medicare Other | Admitting: Cardiology

## 2010-06-16 VITALS — BP 128/82 | HR 85 | Wt 219.0 lb

## 2010-06-16 DIAGNOSIS — Z952 Presence of prosthetic heart valve: Secondary | ICD-10-CM

## 2010-06-16 DIAGNOSIS — E039 Hypothyroidism, unspecified: Secondary | ICD-10-CM

## 2010-06-16 DIAGNOSIS — Z954 Presence of other heart-valve replacement: Secondary | ICD-10-CM

## 2010-06-16 LAB — CBC WITH DIFFERENTIAL/PLATELET
Basophils Relative: 0.5 % (ref 0.0–3.0)
Eosinophils Absolute: 0.3 10*3/uL (ref 0.0–0.7)
HCT: 37.3 % — ABNORMAL LOW (ref 39.0–52.0)
Lymphs Abs: 0.6 10*3/uL — ABNORMAL LOW (ref 0.7–4.0)
MCHC: 33.7 g/dL (ref 30.0–36.0)
MCV: 92.9 fl (ref 78.0–100.0)
Monocytes Absolute: 0.5 10*3/uL (ref 0.1–1.0)
Neutrophils Relative %: 80.5 % — ABNORMAL HIGH (ref 43.0–77.0)
Platelets: 205 10*3/uL (ref 150.0–400.0)

## 2010-06-16 LAB — COMPREHENSIVE METABOLIC PANEL
AST: 14 U/L (ref 0–37)
Albumin: 3.5 g/dL (ref 3.5–5.2)
Alkaline Phosphatase: 78 U/L (ref 39–117)
Potassium: 4.2 mEq/L (ref 3.5–5.1)
Sodium: 139 mEq/L (ref 135–145)
Total Bilirubin: 1 mg/dL (ref 0.3–1.2)
Total Protein: 6 g/dL (ref 6.0–8.3)

## 2010-06-16 LAB — TSH: TSH: 0.61 u[IU]/mL (ref 0.35–5.50)

## 2010-06-16 LAB — T4, FREE: Free T4: 1.23 ng/dL (ref 0.60–1.60)

## 2010-06-16 NOTE — Progress Notes (Signed)
Subjective:   [name] is seen today for a followup visit. He had a transcatheter aortic valve replacement with a 26 mm Edwards sapien valveon May 25, 2010 at [facility] clinic by Dr. Tula Nakayama. He actually had 2 valves placed for positioning and has done well since that time. He's been discharged initially on Lovenox Coumadin aspirin Plavix and now only on Coumadin, Plavix and aspirin. His INR on April 3 was 2.4. He comes in today because the symptoms persisted with mild bleeding of his left groin. He has been active and is feeling better since his percutaneous valve replacement.  He has less shortness of breath and overall is feeling better. He's not had any recurrent angina.  He has multiple problems including ischemic cardiomyopathy, atrial fibrillation, ICD, previous coronary artery bypass grafting on 2 occasions, as well as chronic warfarin anticoagulation. There has been some question as to his ability to take heparin but he tolerated Lovenox well during this hospitalization. Other problems have included diverticulitis, history prostate cancer, and osteoarthritis.  No current outpatient prescriptions on file.    Allergies  Allergen Reactions  . Penicillins     Patient Active Problem List  Diagnoses  . VENTRICULAR TACHYCARDIA  . ATRIAL FIBRILLATION  . OTHER SYMPTOMS INVOLVING CARDIOVASCULAR SYSTEM  . AUTOMATIC IMPLANTABLE CARDIAC DEFIBRILLATOR SITU    History  Smoking status  . Former Smoker  . Quit date: 06/16/1975  Smokeless tobacco  . Not on file    History  Alcohol Use: Not on file    No family history on file.  Review of Systems:   The patient complains of cold intolerance.  No weight gain or weight loss.  The patient denies headaches or blurry vision.  There is no cough or sputum production.  The patient denies dizziness.  There is no hematuria or hematochezia.  The patient denies any muscle aches or arthritis.  The patient denies any rash.  The patient denies frequent  falling or instability.  There is no history of depression or anxiety.  All other systems were reviewed and are negative.   Physical Exam:   The head is normocephalic and atraumatic.  Pupils are equally round and reactive to light.  Sclerae nonicteric.  Conjunctiva is clear.  Oropharynx is unremarkable.  There's adequate oral airway.  Neck is supple there are no masses.  Thyroid is not enlarged.  There is no lymphadenopathy.  Lungs are clear.  Chest is symmetric.  Heart shows a regular rate and rhythm.  S1 and S2 are normal.  There is no murmur click or gallop.  Abdomen is soft normal bowel sounds.  There is no organomegaly.  Genital and rectal deferred.  Extremities are without edema.  Peripheral pulses are adequate.  Neurologically intact.  Full range of motion.  The patient is not depressed.  Skin is warm and dry.there is no false aneurysm in the left groin. They're superficial bleeding on the Band-Aid.  Assessment / Plan:

## 2010-06-16 NOTE — Assessment & Plan Note (Signed)
His aortic valve seemingly is functioning normally he has minimal outflow murmur. I think he needs to remain on the current anticoagulation dosage as it he had to percutaneous valve placed with spacing and placement concerns regarding the first valve placed. He is able to monitor his INRs at home and has been 2.4. I'll have him minimize activity to use some ice on his groin. He scheduled his return to [facility] clinic on April 28. We'll get a 2-D echocardiogram then I like to review that. I'll see him back in followup after that visit.

## 2010-06-17 LAB — PROTIME-INR
INR: 1.7 — ABNORMAL HIGH (ref 0.00–1.49)
Prothrombin Time: 19.8 seconds — ABNORMAL HIGH (ref 11.6–15.2)

## 2010-06-17 LAB — POCT I-STAT 3, ART BLOOD GAS (G3+)
pCO2 arterial: 39 mmHg (ref 35.0–45.0)
pH, Arterial: 7.408 (ref 7.350–7.450)
pO2, Arterial: 120 mmHg — ABNORMAL HIGH (ref 80.0–100.0)

## 2010-06-17 LAB — CBC
Hemoglobin: 14.4 g/dL (ref 13.0–17.0)
MCV: 96.7 fL (ref 78.0–100.0)
RBC: 4.39 MIL/uL (ref 4.22–5.81)
WBC: 7 10*3/uL (ref 4.0–10.5)

## 2010-06-17 LAB — POCT I-STAT 3, VENOUS BLOOD GAS (G3P V)
TCO2: 26 mmol/L (ref 0–100)
pCO2, Ven: 44.9 mmHg — ABNORMAL LOW (ref 45.0–50.0)
pH, Ven: 7.352 — ABNORMAL HIGH (ref 7.250–7.300)
pO2, Ven: 40 mmHg (ref 30.0–45.0)

## 2010-06-17 LAB — BASIC METABOLIC PANEL
CO2: 27 mEq/L (ref 19–32)
Chloride: 101 mEq/L (ref 96–112)
Creatinine, Ser: 1.15 mg/dL (ref 0.4–1.5)
GFR calc Af Amer: >60 mL/min (ref >60)
Sodium: 134 mEq/L — ABNORMAL LOW (ref 135–145)

## 2010-06-19 LAB — COMPREHENSIVE METABOLIC PANEL
Albumin: 3.7 g/dL (ref 3.5–5.2)
Alkaline Phosphatase: 57 U/L (ref 39–117)
BUN: 14 mg/dL (ref 6–23)
CO2: 28 mEq/L (ref 19–32)
Chloride: 99 mEq/L (ref 96–112)
GFR calc non Af Amer: 55 mL/min — ABNORMAL LOW (ref >60)
Glucose, Bld: 89 mg/dL (ref 70–99)
Potassium: 4.1 mEq/L (ref 3.5–5.1)
Total Bilirubin: 0.7 mg/dL (ref 0.3–1.2)

## 2010-06-19 LAB — CBC
HCT: 42.9 % (ref 39.0–52.0)
Hemoglobin: 14.5 g/dL (ref 13.0–17.0)
RBC: 4.49 MIL/uL (ref 4.22–5.81)
WBC: 6.9 10*3/uL (ref 4.0–10.5)

## 2010-06-19 LAB — POCT CARDIAC MARKERS: Troponin i, poc: <0.05 ng/mL (ref 0.00–0.09)

## 2010-06-19 LAB — DIFFERENTIAL
Basophils Absolute: 0.1 10*3/uL (ref 0.0–0.1)
Basophils Relative: 1 % (ref 0–1)
Monocytes Absolute: 0.7 10*3/uL (ref 0.1–1.0)
Neutro Abs: 4.9 10*3/uL (ref 1.7–7.7)
Neutrophils Relative %: 71 % (ref 43–77)

## 2010-06-20 ENCOUNTER — Telehealth: Payer: Self-pay | Admitting: Cardiology

## 2010-06-20 ENCOUNTER — Other Ambulatory Visit: Payer: Self-pay | Admitting: *Deleted

## 2010-06-20 NOTE — Telephone Encounter (Signed)
Pt notified of lab results and to continue same medications.   

## 2010-06-20 NOTE — Telephone Encounter (Signed)
Pt wife called has question regarding medication that was recently called in.  Please call.

## 2010-06-20 NOTE — Telephone Encounter (Signed)
Pt called c/o cold for the last week with low grade fever, sore throat, and congestion.

## 2010-06-20 NOTE — Telephone Encounter (Signed)
Message copied by Barnetta Hammersmith on Mon Jun 20, 2010  1:57 PM ------      Message from: Roger Shelter      Created: Thu Jun 16, 2010  4:15 PM       OK

## 2010-06-20 NOTE — Telephone Encounter (Signed)
Pt's wife called, requesting an alternative to Ceclor due to pt's history of penicillin allergy.  Dr. Deborah Chalk notified and instructed RN to call in Z pack for pt.   RN called in Z pack times one to [facility].  Pt's wife notified.

## 2010-06-24 ENCOUNTER — Telehealth: Payer: Self-pay | Admitting: Cardiology

## 2010-06-24 NOTE — Telephone Encounter (Signed)
Left message for pt to apply light pressure to groin on and off over the weekend to see if groin will stop oozing.  Pt told to call on Monday with update.

## 2010-06-24 NOTE — Telephone Encounter (Signed)
Pt called, c/o continued bleeding from right groin from his procedure a month ago.  Pt INR today is 2.2 and pt finished his Z-Pack today.  Pt states that every time he changes the bandaid on groin the site starts bleeding again.  Pt has not walk in the last several days due to his cold.  Pt would like to know what to do? Pt is still on Aspirin 81 mg, Plavix 75 mg, and Coumadin 5 mg daily.

## 2010-06-24 NOTE — Telephone Encounter (Signed)
PATIENT HAS INR RESULT 2.2.  PLEASE CALL.  PATIENT IS STILL BLEEDING FROM GROIN AREA.

## 2010-06-24 NOTE — Telephone Encounter (Signed)
Would apply light pressure periodically over the course of this weekend. Would stay on the aspirin, plavix and coumadin. He needs to call with an update next week.

## 2010-06-27 ENCOUNTER — Telehealth: Payer: Self-pay | Admitting: Cardiology

## 2010-06-28 ENCOUNTER — Encounter: Payer: Self-pay | Admitting: *Deleted

## 2010-06-29 ENCOUNTER — Ambulatory Visit (INDEPENDENT_AMBULATORY_CARE_PROVIDER_SITE_OTHER): Payer: Medicare Other | Admitting: Cardiology

## 2010-06-29 ENCOUNTER — Encounter: Payer: Self-pay | Admitting: Cardiology

## 2010-06-29 VITALS — BP 130/80 | HR 70 | Ht 75.5 in | Wt 219.5 lb

## 2010-06-29 DIAGNOSIS — Z952 Presence of prosthetic heart valve: Secondary | ICD-10-CM

## 2010-06-29 DIAGNOSIS — K219 Gastro-esophageal reflux disease without esophagitis: Secondary | ICD-10-CM

## 2010-06-29 DIAGNOSIS — Z954 Presence of other heart-valve replacement: Secondary | ICD-10-CM

## 2010-06-29 DIAGNOSIS — R0602 Shortness of breath: Secondary | ICD-10-CM

## 2010-06-29 DIAGNOSIS — I4891 Unspecified atrial fibrillation: Secondary | ICD-10-CM

## 2010-06-29 DIAGNOSIS — K30 Functional dyspepsia: Secondary | ICD-10-CM

## 2010-06-29 DIAGNOSIS — K3189 Other diseases of stomach and duodenum: Secondary | ICD-10-CM

## 2010-06-29 MED ORDER — RANITIDINE HCL 300 MG PO TABS: 300.0000 mg | ORAL_TABLET | Freq: Every day | ORAL | Status: DC

## 2010-06-29 NOTE — Assessment & Plan Note (Signed)
Overall, his aortic valve appears to be functioning well. Valve closure is satisfactory with no significant new murmurs. His anticoagulation routine has been satisfactory. He will be seen in [place] next week will have followup ultrasound and evaluation

## 2010-06-29 NOTE — Assessment & Plan Note (Signed)
We'll add Zantac 300 mg per day. I'll have him use yogurt active culture.

## 2010-06-29 NOTE — Progress Notes (Signed)
Subjective:   Is seen today because of followup symptoms of shortness of breath as well as gaseous indigestion as well as flatus. He has stopped taking Prilosec because of interactions with Plavix. He's not having any bleeding from his groin site. However he is more short of breath. We do note that on exam his heart rate is somewhat faster with persistence of atrial fibrillation  Current Outpatient Prescriptions  Medication Sig Dispense Refill  . ranitidine (ZANTAC) 300 MG tablet Take 1 tablet (300 mg total) by mouth daily.  30 tablet  11    Allergies  Allergen Reactions  . Penicillins     Patient Active Problem List  Diagnoses  . VENTRICULAR TACHYCARDIA  . ATRIAL FIBRILLATION  . OTHER SYMPTOMS INVOLVING CARDIOVASCULAR SYSTEM  . AUTOMATIC IMPLANTABLE CARDIAC DEFIBRILLATOR SITU  . S/P aortic valve replacement    History  Smoking status  . Former Smoker  . Types: Cigarettes  . Quit date: 06/16/1975  Smokeless tobacco  . Never Used    History  Alcohol Use No    No family history on file.  Review of Systems:   The patient denies any heat or cold intolerance.  No weight gain or weight loss.  The patient denies headaches or blurry vision.  There is no cough or sputum production.  The patient denies dizziness.  There is no hematuria or hematochezia.  The patient denies any muscle aches or arthritis.  The patient denies any rash.  The patient denies frequent falling or instability.  There is no history of depression or anxiety.  All other systems were reviewed and are negative.   Physical Exam:   Vital signs reviewed.The head is normocephalic and atraumatic.  Pupils are equally round and reactive to light.  Sclerae nonicteric.  Conjunctiva is clear.  Oropharynx is unremarkable.  There's adequate oral airway.  Neck is supple there are no masses.  Thyroid is not enlarged.  There is no lymphadenopathy.  Lungs are clear.  Chest is symmetric.  Heart shows a irregular rate and rhythm.  S1  and S2 are normal.  There is a soft systolic outflow murmur.  Abdomen is soft normal bowel sounds.  There is no organomegaly.  Genital and rectal deferred.  Extremities are without edema.  Peripheral pulses are adequate.  Neurologically intact.  Full range of motion.  The patient is not depressed.  Skin is warm and dry. Assessment / Plan:

## 2010-06-29 NOTE — Assessment & Plan Note (Signed)
His rate is faster today. I'll check a Holter monitor, B. Met, CBC, and BNP. I like to try to increase his Coreg more. I will increase it to 3.125 mg t.i.d. If his BNP is satisfactory. I'll further increase Coreg as tolerated.

## 2010-06-30 ENCOUNTER — Encounter: Payer: Self-pay | Admitting: Cardiology

## 2010-06-30 LAB — BASIC METABOLIC PANEL
CO2: 30 mEq/L (ref 19–32)
Calcium: 8.9 mg/dL (ref 8.4–10.5)
GFR: 71.37 mL/min (ref >60.00)
Sodium: 139 mEq/L (ref 135–145)

## 2010-06-30 LAB — CBC WITH DIFFERENTIAL/PLATELET
Basophils Relative: 0.4 % (ref 0.0–3.0)
Eosinophils Relative: 4.1 % (ref 0.0–5.0)
Lymphocytes Relative: 19.5 % (ref 12.0–46.0)
MCV: 91.7 fl (ref 78.0–100.0)
Neutrophils Relative %: 68.9 % (ref 43.0–77.0)
RBC: 4.23 Mil/uL (ref 4.22–5.81)
WBC: 5.7 10*3/uL (ref 4.5–10.5)

## 2010-07-01 ENCOUNTER — Encounter: Payer: Self-pay | Admitting: Cardiology

## 2010-07-01 DIAGNOSIS — M1990 Unspecified osteoarthritis, unspecified site: Secondary | ICD-10-CM | POA: Insufficient documentation

## 2010-07-01 DIAGNOSIS — K222 Esophageal obstruction: Secondary | ICD-10-CM | POA: Insufficient documentation

## 2010-07-01 DIAGNOSIS — K219 Gastro-esophageal reflux disease without esophagitis: Secondary | ICD-10-CM | POA: Insufficient documentation

## 2010-07-01 DIAGNOSIS — I2699 Other pulmonary embolism without acute cor pulmonale: Secondary | ICD-10-CM | POA: Insufficient documentation

## 2010-07-01 DIAGNOSIS — K5792 Diverticulitis of intestine, part unspecified, without perforation or abscess without bleeding: Secondary | ICD-10-CM | POA: Insufficient documentation

## 2010-07-01 DIAGNOSIS — I255 Ischemic cardiomyopathy: Secondary | ICD-10-CM | POA: Insufficient documentation

## 2010-07-01 DIAGNOSIS — E785 Hyperlipidemia, unspecified: Secondary | ICD-10-CM | POA: Insufficient documentation

## 2010-07-01 DIAGNOSIS — C61 Malignant neoplasm of prostate: Secondary | ICD-10-CM | POA: Insufficient documentation

## 2010-07-01 DIAGNOSIS — Z7901 Long term (current) use of anticoagulants: Secondary | ICD-10-CM | POA: Insufficient documentation

## 2010-07-01 DIAGNOSIS — Z95810 Presence of automatic (implantable) cardiac defibrillator: Secondary | ICD-10-CM | POA: Insufficient documentation

## 2010-07-01 DIAGNOSIS — I350 Nonrheumatic aortic (valve) stenosis: Secondary | ICD-10-CM | POA: Insufficient documentation

## 2010-07-01 DIAGNOSIS — G959 Disease of spinal cord, unspecified: Secondary | ICD-10-CM | POA: Insufficient documentation

## 2010-07-04 ENCOUNTER — Telehealth: Payer: Self-pay | Admitting: *Deleted

## 2010-07-04 ENCOUNTER — Ambulatory Visit: Payer: Self-pay | Admitting: *Deleted

## 2010-07-04 NOTE — Telephone Encounter (Signed)
Pt notified of lab results and to continue same medications.   

## 2010-07-04 NOTE — Telephone Encounter (Signed)
Message copied by Barnetta Hammersmith on Mon Jul 04, 2010  8:30 AM ------      Message from: Roger Shelter      Created: Fri Jul 01, 2010  3:58 PM       Ok to report

## 2010-07-05 ENCOUNTER — Ambulatory Visit: Payer: Medicare Other | Admitting: Cardiology

## 2010-07-06 ENCOUNTER — Ambulatory Visit (INDEPENDENT_AMBULATORY_CARE_PROVIDER_SITE_OTHER): Payer: Medicare Other | Admitting: Cardiology

## 2010-07-06 ENCOUNTER — Encounter: Payer: Self-pay | Admitting: Cardiology

## 2010-07-06 DIAGNOSIS — Z954 Presence of other heart-valve replacement: Secondary | ICD-10-CM

## 2010-07-06 DIAGNOSIS — Z952 Presence of prosthetic heart valve: Secondary | ICD-10-CM

## 2010-07-06 NOTE — Assessment & Plan Note (Signed)
[name] has multiple issues including an ischemic cardiomyopathy and chronic atrial fibrillation. I'll increase his Coreg to 6.25 mg twice a day and check him again in approximately one to 2 weeks. I think it is reasonable for him to be on Lasix 20 mg every other day or every third day. When we see him back in followup, we will check a BNP and perhaps increase his Coreg even more. I don't want to slow his heart rate so much that he begins to pace from his ICD on a regular basis. We'll continue to monitor his INR weekly. I will see him back in the next few weeks and then will refer to Dr. Riley Kill for followup

## 2010-07-06 NOTE — Progress Notes (Signed)
Addended by: Barnetta Hammersmith on: 07/06/2010 01:56 PM   Modules accepted: Orders

## 2010-07-06 NOTE — Progress Notes (Signed)
Subjective:   [name] comes in today for a followup visit. He had percutaneous aortic valve replacement in [place] first week of February 2012. In general, he is improved and has done well since that time. He is continued on warfarin, Plavix, and aspirin. He shortness of breath is better but he is had more feeling of dyspnea over the last couple of weeks. I increased his carvedilol to 3.125 mg t.i.d. And got a Holter monitor which did confirm a known heart rate of 41 beats minute with some chronic elevation of his atrial fibrillation ventricular response but overall mean heart rate of 74 beats per minute. On a 6 minute walk test in [place], his heart rate increased approximately 115.2-D echocardiogram in [place] estimated his ejection fraction at 20%. He had mild perivalvular aortic regurgitation and at the time of his echocardiogram his aortic regurgitation was graded at 1+ although it is definite audible today. His BNP has fallen to 1587 from 2260.  Current Outpatient Prescriptions  Medication Sig Dispense Refill  . alfuzosin (UROXATRAL) 10 MG 24 hr tablet Take 10 mg by mouth daily.        Marland Kitchen aspirin 81 MG chewable tablet Chew 81 mg by mouth daily.        Marland Kitchen atorvastatin (LIPITOR) 10 MG tablet Take 10 mg by mouth daily.        . carvedilol (COREG) 3.125 MG tablet Take 3.125 mg by mouth 3 (three) times daily.        . clopidogrel (PLAVIX) 75 MG tablet Take 75 mg by mouth daily.        Marland Kitchen DOCUSATE SODIUM PO Take by mouth daily.        Marland Kitchen levothyroxine (SYNTHROID, LEVOTHROID) 150 MCG tablet Take 150 mcg by mouth daily.        . Multiple Vitamin (MULTIVITAMIN) tablet Take 1 tablet by mouth daily.        . psyllium (METAMUCIL) 58.6 % powder Take 1 packet by mouth as needed.        . ranitidine (ZANTAC) 300 MG tablet Take 1 tablet (300 mg total) by mouth daily.  30 tablet  11  . warfarin (COUMADIN) 5 MG tablet Take 5 mg by mouth daily. AS DIRECTED        . ciprofloxacin (CIPRO) 500 MG tablet Take 500 mg  by mouth daily.        Marland Kitchen enoxaparin (LOVENOX) 100 MG/ML SOLN Inject into the skin every 12 (twelve) hours.        Marland Kitchen omeprazole (PRILOSEC OTC) 20 MG tablet Take 20 mg by mouth as needed.          Allergies  Allergen Reactions  . Antihistamines, Diphenhydramine-Type   . Penicillins     Patient Active Problem List  Diagnoses  . VENTRICULAR TACHYCARDIA  . ATRIAL FIBRILLATION  . OTHER SYMPTOMS INVOLVING CARDIOVASCULAR SYSTEM  . AUTOMATIC IMPLANTABLE CARDIAC DEFIBRILLATOR SITU  . S/P aortic valve replacement  . Indigestion  . Ischemic cardiomyopathy  . Diverticulitis  . AF (atrial fibrillation)  . Chronic anticoagulation  . ICD (implantable cardiac defibrillator) in place  . Prostate cancer  . Osteoarthritis  . Aortic stenosis, severe  . MI (myocardial infarction)  . Cervical myelopathy  . Pulmonary embolism  . Hyperlipidemia  . GERD (gastroesophageal reflux disease)  . Esophageal stricture    History  Smoking status  . Former Smoker  . Types: Cigarettes  . Quit date: 06/16/1975  Smokeless tobacco  . Never Used    History  Alcohol Use No    Family History  Problem Relation Age of Onset  . Heart disease Mother 73  . Diabetes Mother 2  . Heart disease Father 4    Review of Systems:   The patient denies any heat or cold intolerance.  No weight gain or weight loss.  The patient denies headaches.  There is no cough or sputum production.  The patient denies dizziness.  There is no hematuria or hematochezia.  The patient denies any muscle aches or arthritis.  The patient denies any rash.  The patient denies frequent falling or instability.  There is no history of depression or anxiety.  All other systems were reviewed and are negative.   Physical Exam:   Weight is 217. Blood pressure 114/68, heart rate 65.The head is normocephalic and atraumatic.  Pupils are equally round and reactive to light.  Sclerae nonicteric.  Conjunctiva is clear.  Oropharynx is unremarkable.   There's adequate oral airway.  Neck is supple there are no masses.  Thyroid is not enlarged.  There is no lymphadenopathy.  Lungs are clear.  Chest is symmetric.  Heart shows a regular rate and rhythm.  S1 and S2 are normal.  There is grade 2 diastolic murmur over the sternum consistent with aortic insufficiency.  Abdomen is soft normal bowel sounds.  There is no organomegaly.  Genital and rectal deferred.  Extremities are without edema.  Peripheral pulses are adequate.  Neurologically intact.  Full range of motion.  The patient is not depressed.  Skin is warm and dry.  Assessment / Plan:

## 2010-07-11 ENCOUNTER — Encounter: Payer: Self-pay | Admitting: Cardiology

## 2010-07-13 ENCOUNTER — Other Ambulatory Visit (INDEPENDENT_AMBULATORY_CARE_PROVIDER_SITE_OTHER): Payer: Medicare Other | Admitting: *Deleted

## 2010-07-13 DIAGNOSIS — R0602 Shortness of breath: Secondary | ICD-10-CM

## 2010-07-13 LAB — BASIC METABOLIC PANEL
CO2: 29 mEq/L (ref 19–32)
Chloride: 102 mEq/L (ref 96–112)
Glucose, Bld: 92 mg/dL (ref 70–99)
Potassium: 4.3 mEq/L (ref 3.5–5.1)
Sodium: 137 mEq/L (ref 135–145)

## 2010-07-14 ENCOUNTER — Ambulatory Visit (INDEPENDENT_AMBULATORY_CARE_PROVIDER_SITE_OTHER): Payer: Medicare Other | Admitting: Cardiology

## 2010-07-14 ENCOUNTER — Encounter: Payer: Self-pay | Admitting: Cardiology

## 2010-07-14 ENCOUNTER — Telehealth: Payer: Self-pay | Admitting: *Deleted

## 2010-07-14 DIAGNOSIS — Z954 Presence of other heart-valve replacement: Secondary | ICD-10-CM

## 2010-07-14 DIAGNOSIS — Z952 Presence of prosthetic heart valve: Secondary | ICD-10-CM

## 2010-07-14 NOTE — Progress Notes (Signed)
Subjective:   [name] is seen today for a followup after his percutaneous aortic valve replacement. In general, he's been doing better. He is not short of breath and overall is tolerating the gradual increase in his carvedilol. I'm concerned about increasing carvedilol to the point that we would become pacer dependent and possibly get some worsening LV dysfunction because a paced rhythm and possible LV dyssynchrony.  Overall, he is continued to do well and improve.  Current Outpatient Prescriptions  Medication Sig Dispense Refill  . alfuzosin (UROXATRAL) 10 MG 24 hr tablet Take 10 mg by mouth daily.        Marland Kitchen aspirin 81 MG chewable tablet Chew 81 mg by mouth daily.        Marland Kitchen atorvastatin (LIPITOR) 10 MG tablet Take 10 mg by mouth daily.        . carvedilol (COREG) 3.125 MG tablet Take 6.25 mg by mouth 2 (two) times daily with a meal.       . ciprofloxacin (CIPRO) 500 MG tablet Take 500 mg by mouth daily.        . clopidogrel (PLAVIX) 75 MG tablet Take 75 mg by mouth daily.        Marland Kitchen DOCUSATE SODIUM PO Take by mouth daily.        Marland Kitchen enoxaparin (LOVENOX) 100 MG/ML SOLN Inject into the skin every 12 (twelve) hours.        . FUROSEMIDE PO Take 1 tablet by mouth. Take 1 tablet every three days       . levothyroxine (SYNTHROID, LEVOTHROID) 150 MCG tablet Take 150 mcg by mouth daily.        . Multiple Vitamin (MULTIVITAMIN) tablet Take 1 tablet by mouth daily.        Marland Kitchen omeprazole (PRILOSEC OTC) 20 MG tablet Take 20 mg by mouth as needed.        . psyllium (METAMUCIL) 58.6 % powder Take 1 packet by mouth as needed.        . ranitidine (ZANTAC) 300 MG tablet Take 1 tablet (300 mg total) by mouth daily.  30 tablet  11  . warfarin (COUMADIN) 5 MG tablet Take 5 mg by mouth daily. AS DIRECTED          Allergies  Allergen Reactions  . Antihistamines, Diphenhydramine-Type   . Penicillins     Patient Active Problem List  Diagnoses  . VENTRICULAR TACHYCARDIA  . ATRIAL FIBRILLATION  . OTHER SYMPTOMS  INVOLVING CARDIOVASCULAR SYSTEM  . AUTOMATIC IMPLANTABLE CARDIAC DEFIBRILLATOR SITU  . S/P aortic valve replacement  . Indigestion  . Ischemic cardiomyopathy  . Diverticulitis  . AF (atrial fibrillation)  . Chronic anticoagulation  . ICD (implantable cardiac defibrillator) in place  . Prostate cancer  . Osteoarthritis  . Aortic stenosis, severe  . MI (myocardial infarction)  . Cervical myelopathy  . Pulmonary embolism  . Hyperlipidemia  . GERD (gastroesophageal reflux disease)  . Esophageal stricture    History  Smoking status  . Former Smoker  . Types: Cigarettes  . Quit date: 06/15/1969  Smokeless tobacco  . Never Used    History  Alcohol Use No    Family History  Problem Relation Age of Onset  . Heart disease Mother 47  . Diabetes Mother 61  . Heart disease Father 60    Review of Systems:   The patient denies any heat or cold intolerance.  No weight gain or weight loss.  The patient denies headaches or blurry vision.  There is  no cough or sputum production.  The patient denies dizziness.  There is no hematuria or hematochezia.  The patient denies any muscle aches or arthritis.  The patient denies any rash.  The patient denies frequent falling or instability.  There is no history of depression or anxiety.  All other systems were reviewed and are negative.   Physical Exam:   Weight is 216. Blood pressure is 102/60. Heart rate is 76. He is a grade 2/4 murmur of aortic insufficiency.lungs are clear. There is no lower extremity edema.The head is normocephalic and atraumatic.  Pupils are equally round and reactive to light.  Sclerae nonicteric.  Conjunctiva is clear.  Oropharynx is unremarkable.  There's adequate oral airway.  Neck is supple there are no masses.  Thyroid is not enlarged.  There is no lymphadenopathy.  Lungs are clear.  Chest is symmetric.  Heart shows a regular rate and rhythm.  S1 and S2 are normal.  There is no murmur click or gallop.  Abdomen is soft  normal bowel sounds.  There is no organomegaly.  Genital and rectal deferred.  Extremities are without edema.  Peripheral pulses are adequate.  Neurologically intact.  Full range of motion.  The patient is not depressed.  Skin is warm and dry.  Assessment / Plan:

## 2010-07-14 NOTE — Telephone Encounter (Signed)
Lab work reported 

## 2010-07-15 NOTE — Assessment & Plan Note (Signed)
General, he continues to improve. I'll increase his Coreg to 12.5 mg b.i.d. I'll see him again in 3 weeks for a B. Met, BNP, and CBC. Thereafter I'll refer to Dr. Riley Kill for followup.

## 2010-07-18 ENCOUNTER — Ambulatory Visit: Payer: Self-pay | Admitting: *Deleted

## 2010-07-18 ENCOUNTER — Telehealth: Payer: Self-pay | Admitting: Cardiology

## 2010-07-18 ENCOUNTER — Encounter: Payer: Self-pay | Admitting: Cardiology

## 2010-07-18 NOTE — Telephone Encounter (Addendum)
Pt told to decrease Coumadin to 2.5 mg x one day, 5 mg the other six days a week,  and recheck INR in one week.  Pt instructed per last office visit to increase Coreg to 12.5 mg BID.  Pt told to take 4 Coreg 3.125 mg in AM and 4 in PM until he uses up his supply then get new script for the 12.5 mg tablets.  Pt verbalized to RN understanding of RN instructions.

## 2010-07-18 NOTE — Telephone Encounter (Signed)
[name] reports that INR today is 2.6, weight is 207#, BP 109/63 and HR 82.  Patient needs to know about COREG and doubling prescription and also wife [name]'s test?  Please call.

## 2010-07-18 NOTE — Patient Instructions (Signed)
Pt verbalized to RN understanding of Coumadin instructions.   

## 2010-07-25 ENCOUNTER — Encounter (HOSPITAL_COMMUNITY): Payer: Medicare Other

## 2010-07-25 ENCOUNTER — Ambulatory Visit (HOSPITAL_COMMUNITY): Payer: Medicare Other

## 2010-07-25 ENCOUNTER — Encounter: Payer: Self-pay | Admitting: Cardiology

## 2010-07-25 ENCOUNTER — Telehealth: Payer: Self-pay | Admitting: Cardiology

## 2010-07-25 ENCOUNTER — Ambulatory Visit: Payer: Self-pay | Admitting: *Deleted

## 2010-07-25 NOTE — Patient Instructions (Signed)
Pt verbalized to RN understanding of Coumadin instructions.   

## 2010-07-25 NOTE — Telephone Encounter (Signed)
Pt notified of Coumadin instructions.  See Anti-Coag Encounter.

## 2010-07-25 NOTE — Telephone Encounter (Signed)
HIS INR IS  2.7

## 2010-07-26 ENCOUNTER — Telehealth: Payer: Self-pay | Admitting: Cardiology

## 2010-07-26 NOTE — Assessment & Plan Note (Signed)
OFFICE VISIT   IRFAN, VEAL  DOB:  1932-11-26                                        January 11, 2010  CHART #:  [id number]   REASON FOR CONSULTATION:  Aortic stenosis, LV dysfunction.   HISTORY OF PRESENT ILLNESS:  The patient is a 77 year old male with a  complicated cardiac history but presents now with 4-5 years of  progressive aortic stenosis.  His most recent echocardiogram on December 27, 2009, shows moderately calcified aortic valve trileaflet with  calculated valve area of 0.84, mean gradient estimated at 28 mm, peak  gradient at 47 with a peak velocity of 342 cm a second across the aortic  valve.  His cardiac history begins 35 years ago when at age 52 he had  coronary artery bypass grafting x3 in [place], 2 weeks postop had a  myocardial infarction.  In January 1991 had coronary artery bypass  grafting x3 here in [facility] by Dr. Andrey Campanile.  At that time he had a  right internal mammary placed to the LAD, left internal mammary placed  to obtuse marginal and a vein graft to the posterior descending coronary  artery.  He was noted to have a murmur first noticed 4-5 years ago and  since has had been followed by Dr. Deborah Chalk with known decreased ejection  fraction and aortic stenosis by echocardiogram.  His last cardiac  catheterization was 1 year ago showing patent mammary vessels.   He denies syncope.  Denies lightheadedness.  Notes that he is able to  exercise on his exercise bike at about 2-1/2 miles an hour without  difficulty, but if he goes much faster he will get some vague right  chest discomfort and shortness of breath.  He has had nocturnal dyspnea.  Occasionally, he will have mild lower extremity edema.  He has had no  frank syncope.   PAST MEDICAL HISTORY:  Also includes history of atrial fibrillation, ICD  placed in February 2003 with no discharges, history of hypertension,  hypercholesterolemia, history of cholecystectomy, history  of  gastroesophageal reflux with esophageal stricture and need for  dilatation.  He notes that tomorrow he is to see the gastroenterologist  as these symptoms have somewhat returned.  He has had strokes x2 both  after cardiac catheterizations many years ago.  He has had 2 episodes of  pulmonary emboli, currently on long-term Coumadin.  He has a history of  HIT in the past, but I do not have the details of this diagnosis.   FAMILY AND SOCIAL HISTORY:  The patient is married one time, owner of  [employer], lives with his wife.  His mother had diabetes,  has a sister with atrial fibrillation.  He had been a smoker but quit in  1971.   CURRENT MEDICATIONS:  Prilosec, Coumadin, Coreg, Synthroid, Lipitor,  Centrum.   ALLERGIES:  Penicillin, antihistamines.  Also has a history of HIT.   REVIEW OF SYSTEMS:  CARDIAC:  Positive for exertional shortness of  breath, mild lower extremity edema.  GENERAL:  He denies fever, chills or night sweats or other  constitutional symptoms.  Does note that now he has increasing fatigue.  Denies blood in his stool.  Denies blood in his urine.  Has had  difficulty swallowing recently.  He has no abdominal complaints  currently.  Other review of systems are negative.   PHYSICAL EXAMINATION:  His blood pressure is 110/70, pulse is 73,  respiratory rate is 18, O2 saturations 98%.  His weight is 223 pounds.  The patient's neurologically intact.  Pupils equal, round, reactive to  light.  Neck is without carotid bruits.  Lungs are clear bilaterally.  Cardiac exam reveals irregular rate with a harsh murmur consistent with  aortic stenosis.  Abdominal exam is benign.  On the lower extremities,  he has had vein harvested from the left ankle to mid left thigh and from  the right ankle to just below the right knee.   Cardiac catheterization films from September 2010 are reviewed and the  patient's recent echo report.   IMPRESSION:  The patient with  complex cardiac history with depressed  left ventricular function very significant probably multifactorial both  from known aortic stenosis and also history of myocardial infarctions in  the past.  The patient is obviously at a high risk for redo surgery with  his history of heparin-induced thrombocytopenia, decreased left  ventricular function and redo status.  He appears to becoming more  symptomatic from his aortic stenosis.  At this point I have discussed  with him consideration of a percutaneous aortic valve replacement if  this is technically feasible and suitable.  I have recommended that we  make a referral to appropriate site for this discussion in the process  of reviewing all options.  I will discuss this with Dr. Deborah Chalk and then  we will contact the patient to make referral arrangements.   Sheliah Plane, MD  Electronically Signed   EG/MEDQ  D:  01/11/2010  T:  01/12/2010  Job:  [id number]   cc:   Colleen Can. Deborah Chalk, M.D.

## 2010-07-26 NOTE — Consult Note (Signed)
NAMEBOWYN, [name] NO.:  [id number]   MEDICAL RECORD NO.:  [id number]          PATIENT TYPE:  EMS   LOCATION:  [facility]                         FACILITY:  [facility]   PHYSICIAN:  Colleen Can. Deborah Chalk, M.D.DATE OF BIRTH:  03-14-1932   DATE OF CONSULTATION:  09/18/2008  DATE OF DISCHARGE:  09/18/2008                                 CONSULTATION   We were asked by the Emergency room physician to see [name] in the  emergency room and he presented with chest discomfort.  Over the last 1-  2 weeks, he has had progressive constipation and severe lower abdominal  pain.  He was started on Amitiza for significant constipation 3 days  ago.  He had a watery stool that resolved.  However, he had some vague  chest discomfort on the second day and then last night, he had tightness  that persisted.  He became very gaseous.  He had no relief with  nitroglycerin at home.  He had had ongoing belching and burping.   PAST MEDICAL HISTORY:  1. In brief, he has extensive coronary artery disease with left      ventricular dysfunction, and now severe aortic stenosis.  His last      redo surgery was in 1991.  2. He has had paroxysmal atrial fibrillation, on amiodarone, Coumadin      anticoagulation.  3. Prostate cancer.  4. Hypothyroidism.  5. Hypercholesterolemia.   ALLERGIES:  PENICILLIN.   MEDICATIONS:  Reviewed.  He is recently started on Amitiza.   FAMILY HISTORY:  Father died at 81.  Mother died at age 66.  Brother has  had heart disease.   SOCIAL HISTORY:  He is married.  No smoking x30 years.  He is a retired  [employer] fund[employer].   REVIEW OF SYSTEMS:  All other review of systems currently are negative.   PHYSICAL EXAMINATION:  VITAL SIGNS:  Blood pressure is 127/58 and heart  rate 40-48 (chronic).  SKIN:  Warm and dry.  Color is normal.  HEENT:  Negative. Normocephalic/atraumatic. PERRLA. Neck supple with no  adenopathy. No JVD.  LUNGS:  Clear.  HEART:  Regular rate and  rhythm.  ABDOMEN:  Increased bowel sounds.  Nontender.  EXTREMITIES:  Without edema.  MUSCULOSKELETAL:  Good range of motion. Strength is equal.  NEUROLOGIC:  He is intact with no focal deficits.   Hematocrit is 42 and white count is 6000.  Chemistries are all normal.  Cardiac markers are negative x1 set.  Chest x-ray is negative.   OVERALL IMPRESSION:  1. Chest pain, probably secondary to initiation of gastrointestinal      motility drug.  2. Severe aortic stenosis.  3. Known advanced ischemic heart disease.   PLAN:  We will stop Amitiza for now.  We used MiraLax and Dulcolax on  p.r.n. basis.  He has nitroglycerin p.r.n.  We will have him on clear  liquids initially and then gradually advancing his diet in hopes of  giving his GI tract a rest.  Currently, he is comfortable.  I think it  is reasonable to send him  home.  He does have significant aortic  stenosis and were certainly concerned about any chest pain that he may  have that may be a symptom of recurrence of angina and the fact that his  aortic stenosis is no longer asymptomatic.  Overall, I do not think that  is the current situation.      Colleen Can. Deborah Chalk, M.D.  Electronically Signed     SNT/MEDQ  D:  09/18/2008  T:  09/18/2008  Job:  [id number]

## 2010-07-26 NOTE — Assessment & Plan Note (Signed)
[facility]                         ELECTROPHYSIOLOGY OFFICE NOTE   NAME:[name], [name]                        MRN:          [id number]  DATE:01/14/2008                            DOB:          August 27, 1932    [name] is seen in followup for ventricular tachycardia with his  previously implanted [employer]tr[employer] ICD.  He is also having some atrial  fibrillation and this has prompted intercurrently the initiation of  amiodarone, which he is not tolerating well because of sleep disturbance  and constipation.  He has also recently been hospitalized for H1N1  pneumonia.   His medications are notable for the intercurrent addition of the  amiodarone at 200 mg a day.  He also takes Coreg 3.125 b.i.d., Coumadin,  and Synthroid.   On examination, his blood pressure was 134/70 with a pulse of 53.  The  weight was 228, which is stable.  His neck veins were 7.  His lungs were  clear.  His heart sounds were regular without murmurs.  The abdomen was  soft.  Extremities had no edema.   Interrogation of his Ronnald Nian ICD demonstrates an R-wave of 15.6 with  impedance of 640, threshold of 1 volt at 0.4.  Battery voltage was 3.07.   IMPRESSION:  1. Ischemic heart disease with prior myocardial infarction and      depressed left ventricular function as well as bypass surgery.  2. Previously implanted implantable cardioverter-defibrillator for      primary prevention with intercurrent therapy for ventricular      tachycardia.  3. Atrial fibrillation, now on amiodarone and Coumadin.  4. Significant side effects related to the amiodarone.   He will be discussing with Dr. Deborah Chalk the alternatives to amiodarone  given the symptoms that he is having with it.   We will see him again in 1 year's time and follow him remotely in the  interim.     Duke Salvia, MD, [facility]  Electronically Signed    SCK/MedQ  DD: 01/14/2008  DT: 01/15/2008  Job #: [id number]   cc:   Colleen Can.  Deborah Chalk, M.D.

## 2010-07-26 NOTE — Discharge Summary (Signed)
NAMERUDY, LUHMANN NO.:  [id number]   MEDICAL RECORD NO.:  [id number]          PATIENT TYPE:  INP   LOCATION:  2013                         FACILITY:  [facility]   PHYSICIAN:  Colleen Can. Deborah Chalk, M.D.DATE OF BIRTH:  [birth year]   DATE OF ADMISSION:  12/30/2007  DATE OF DISCHARGE:  01/02/2008                               DISCHARGE SUMMARY   DISCHARGE DIAGNOSES:  1. Flu viral-like syndrome with subsequent improvement.  2. Nonischemic heart disease.  3. Ischemic cardiomyopathy.  4. Functioning implantable cardioverter-defibrillator.  5. Atrial fibrillation holding sinus rhythm on low-dose amiodarone.  6. Aortic stenosis.  7. Hyperlipidemia.  8. Hypertension.  9. Hypothyroidism.  10.Benign prostatic hypertrophy.   HISTORY OF PRESENT ILLNESS:  The patient is a 77 year old white male who  has multiple medical problems.  He presented with fever, chills, cough,  and orthopnea in the setting of asthma and wheezing.  His wife has  recently been sick with a flu and then he became ill since the previous  Saturday.  He had had a Z-Pak and Tussionex called in for him earlier in  the day with minimal improvement.  He began to get more short of breath.  Breathing treatments were sent to his home as well, but before they  could be initiated, he continued to have worsening shortness of breath  and presented to the emergency room for further evaluation.   Please see the history and physical for further patient presentation and  profile.   LABORATORY DATA:  His INR was 1.9.  CBC showed a white count 9000,  hematocrit 38, platelets 140.  Chemistries were normal.  His chest x-ray  showed stable chest.   CK and troponin were negative.   HOSPITAL COURSE:  The patient was admitted.  He was continued on his  antibiotics as well as nebulizer therapy.  He had slow improvement over  the next several days, did become afebrile.  By the time of discharge,  he was up and ambulatory in  the room.  He has been maintained on droplet  isolation throughout this admission.  He has had no cardiac issues arise  and is felt to be a satisfactory candidate for discharge today on  January 02, 2008.   DISCHARGE CONDITION:  Stable.   DISCHARGE DIET:  Low-salt, heart-healthy.   His activity is to be increased slowly.   We will plan on seeing him back in approximately 2 weeks.  He is asked  to call to schedule that appointment.   DISCHARGE MEDICINES:  1. Amiodarone 200 mg a day.  2. Coreg 3.125 b.i.d.  3. Synthroid 150 mcg a day.  4. Lipitor 10 mg a day.  5. Prilosec if needed.  6. Multivitamin daily.  7. Coumadin, will continue 5 mg 4 days a week, 2.5 mg for 3 days.  8. Uroxatral 10 mg a day.   He will finish his Zithromax that he has at home.  He can use his  nebulizers that are at home with albuterol 3 times a day as needed.  Tylenol and Robitussin will be p.r.n.  He is to call our office if any problems arise in the interim.      Sharlee Blew, N.P.      Colleen Can. Deborah Chalk, M.D.  Electronically Signed    LC/MEDQ  D:  01/02/2008  T:  01/02/2008  Job:  [id number]

## 2010-07-26 NOTE — Telephone Encounter (Signed)
Patient wanted you to know that Dr.Gammon did not prescribe an antibiotic for him and that this affects his Coumadin level.  He wants to know what you want him to do

## 2010-07-26 NOTE — H&P (Signed)
NAMEMELODY, SAVIDGE NO.:  [id number]   MEDICAL RECORD NO.:  [id number]          PATIENT TYPE:  INP   LOCATION:  2013                         FACILITY:  [facility]   PHYSICIAN:  Unice Cobble, MD     DATE OF BIRTH:  [birth year]   DATE OF ADMISSION:  12/30/2007  DATE OF DISCHARGE:                              HISTORY & PHYSICAL   CARDIOLOGIST:  Dr. Deborah Chalk for [facility].   CHIEF COMPLAINT:  Cough.   HISTORY OF PRESENT ILLNESS:  This is a 77 year old white male with a  history of coronary artery disease status post CABG and ischemic  cardiomyopathy status post ICD who presents with fever and chills,  cough, and orthopneic asthma/wheezing.  His wife recently got over the  flu.  He has had symptoms since Saturday.  He has a nonproductive cough  and fevers and chills without myalgias.  He went to the doctor today who  prescribed him a Z-Pak, Robitussin-DM, and Tylenol with minimal  improvement.  He has no complaints of edema, PND, or orthopnea.  He just  has a wheezing when lying flat.  His symptoms improved with a nebulizer  treatment.   PAST MEDICAL HISTORY:  1. Coronary disease status post MI in 27 and 1978 with CABG in 1979      and 1991.  His grafts are LIMA to OM, RIMA to LAD, saphenous venous      graft to RCA.  2. Atrial fibrillation.  3. Ischemic cardiomyopathy with ejection fraction of 20%-25%.  4. Status post ICD.  5. Aortic stenosis.  6. Hyperlipidemia.  7. History of PE  8. History of prostate cancer status post radiation treatment in 2008.  9. GERD status post esophageal dilatation.  10.Hypothyroidism.  11.Hypertension.  12.History of CVA.  13.Status post cholecystectomy.  14.Status post shoulder surgery.   ALLERGIES:  PENICILLIN.   MEDICATIONS:  1. Uroxatral 10 mg daily.  2. Amiodarone 200 mg daily.  3. Coreg 3.125 mg b.i.d.  4. Coumadin 5 mg 4 days a week and 2.5 mg 3 days a week.  5. Lipitor 10 mg daily.  6. Prilosec  p.r.n.  7. Synthroid 150 mcg daily.  8. Multivitamin daily.  9. Z-Pak  10.Robitussin-DM  11.Tylenol.   SOCIAL HISTORY:  Retired.  Married.  Five children.  Quit tobacco 30  years ago.  Occasional alcohol.   FAMILY HISTORY:  Noncontributory.   REVIEW OF SYSTEMS:  A complete review of systems reviewed and found to  be otherwise negative except as stated in the HPI.   PHYSICAL EXAM:  Temperature is 99.4 with a blood pressure of 135/71.  Pulse is 73.  Respiratory rate 32.  O2 sats 94%-100% on room air.  GENERAL:  He is in no acute distress.  HEENT:  Shows MMM.  PERRLA, oropharynx is clear.  NECK:  Shows no jugular venous distention, bruits or thyromegaly.  No  lymphadenopathy present.  LUNGS:  Clear to auscultation bilaterally.  HEART:  Regular rate and rhythm with a 2 out of 6 systolic murmur best  heard in the left upper sternal  border radiating to his carotids.  His  S2 is clearly heard.  ABDOMEN:  Soft and nontender with nondistended.  Good bowel sounds.  No  evidence of hepatosplenomegaly.  EXTREMITIES:  Warm and well-perfused  without cyanosis, clubbing or edema.   LABORATORY EVALUATION:  His white count is 8.9 with a hemoglobin of  14.4.  Troponin is less than 0.05 with a CK-MB of 1.5 and his urinalysis  is negative.  His EKG shows normal sinus rhythm at 74 beats per minute.  He has a prior anterior infarct age indeterminate.  Otherwise  nonspecific ST and T-wave changes.   ASSESSMENT/PLAN:  This is a 77 year old white male with a history of  ischemic cardiomyopathy and coronary artery bypass grafting who presents  with flu symptoms.  There is no evidence of congestive heart failure  with the exception of orthopneic wheezing, which may be more related to  bronchiolar inflammation than fluid overload.  1. Flu:  Continue Robitussin-DM for cough, Tylenol for fever, and      complete Z-Pak (4 more days).  Supportive care.  No role for      Tamiflu in this is situation.  2.  Questionable congestive heart failure:  His symptoms are atypical      for congestive heart failure exacerbation and he has never had this      before even with his low ejection fraction.  I will check a BNP and      if it is elevated we will consider echocardiography.  3. Ischemic cardiomyopathy:  Continue home meds.  4. Gastrointestinal prophylaxis.      Unice Cobble, MD  Electronically Signed     ACJ/MEDQ  D:  12/30/2007  T:  12/31/2007  Job:  [id number]

## 2010-07-26 NOTE — Assessment & Plan Note (Signed)
[facility]                         ELECTROPHYSIOLOGY OFFICE NOTE   NAME:[name], Jeffrey ELVIN                        MRN:          [id number]  DATE:09/07/2006                            DOB:          [birth year]    [name] was seen today in the device clinic for followup of his  [employer] Marquee ICD implanted on July 06, 2003 for ventricular  tachycardia. Interrogation of his defibrillator demonstrates R waves of  14.9 millivolts with an RV impedance of 656 ohms and a threshold of 1  volt at 0.2 msec. The shock impedances were 45 and 55 ohms. His battery  voltage was 3.13 volts with a charge time of 6.91 seconds and he was in  normal sinus rhythm today. He did have one episode of ATP and 809  nonsustained episodes the majority of which occurred on May 10 of this  year. One of the nonsustained episodes appears to be ventricular in  origin, the rest appear to be supraventricular. Question atrial  fibrillation with rapid ventricular response. [name] is on Coumadin  therapy and does have a history of PAF. He is programmed for a VT1 zone  of 150 beats per minute, fast VT zone of 240 beats per minute and a VF  zone of 200 beats per minute. He has backup brady pacing VVI 40 and has  V pacing 0.2% of the time. He will return to the clinic in 1 year with  Dr. Graciela Husbands.      Gypsy Balsam, RN,BSN  Electronically Signed      Duke Salvia, MD, [facility]  Electronically Signed   AS/MedQ  DD: 09/07/2006  DT: 09/07/2006  Job #: [id number]

## 2010-07-26 NOTE — Discharge Summary (Signed)
[name], [name] NO.:  [id number]   MEDICAL RECORD NO.:  [id number]          PATIENT TYPE:  INP   LOCATION:  3702                         FACILITY:  [facility]   PHYSICIAN:  Colleen Can. Deborah Chalk, M.D.DATE OF BIRTH:  [birth year]   DATE OF ADMISSION:  11/03/2007  DATE OF DISCHARGE:  11/05/2007                               DISCHARGE SUMMARY   DISCHARGE DIAGNOSES:  1. Atypical chest pain.  2. Known history of ischemic heart disease with previous coronary      artery bypass grafting as well as redo bypass grafting.  He had a      remote myocardial infarction in 1974 as well as in 1978.  His      original surgery in 1979 was with Dr. __________ and then      subsequent redo surgery by Dr. Particia Lather in 1991.  Last      catheterization occurred in 2003 at that time he had severe left      ventricular dysfunction, totally occluded native circulation with      patent bypass graft.  3. Left ventricular dysfunction.  4. Functioning implantable cardioverter-defibrillator.  5. History of atrial fibrillation maintained in sinus rhythm on      amiodarone.  6. Chronic Coumadin therapy.  7. Hyperlipidemia.  8. History of myopathy.  9. History of pulmonary emboli.  10.Prostate cancer with previous radiation in 2008.  11.Gastroesophageal reflux disease.  12.Hypothyroidism.  13.Hypertension.  14.History of stroke.   HISTORY OF PRESENT ILLNESS:  [name] is a very pleasant 77 year old white  male who has multiple medical problems who presented to the emergency  room for evaluation of chest pain.  He noted that Saturday evening while  he was watching TV in bed he had the onset of discomfort.  It basically  resolved; however, the next day he awoke and the discomfort occurred  again, this time, he took nitroglycerin.  Unfortunately, his symptoms  continued to wax and wane and he subsequently sought evaluation in the  emergency room.  He describes this as being central in location  and  feels like it is down to the bone.  It seems different from his  previous chest pain syndrome.  Prior to this, he had been walking 2  miles a day without problems.  He denies being short of breath.  He has  had no radiation of the discomfort, nausea, vomiting, or diaphoresis.  He was subsequently seen by Dr. Peter Swaziland and was admitted for  further evaluation.   Please see the dictated history and physical for further patient  presentation and profile.   LABORATORY DATA:  His EKG showed sinus bradycardia with old inferior  myocardial infarction.  There was ST and T-wave depression consistent  with lateral ischemia.  His chest x-ray showed no active disease.  His  white count was 6500, hemoglobin was 15, hematocrit 45, and platelets  220.  Sodium 136, potassium 4.4, chloride 102, CO2 of 27, BUN 13,  creatinine was 1.2, glucose was 109.  All of his cardiac enzymes were  negative.  His INR was 2.3  and D-dimer was 0.22.   HOSPITAL COURSE:  The patient was admitted.  He ruled out negative for  myocardial infarction.  It was initially our plan to proceed on with  cardiac catheterization in light of the symptomatology.  His Coumadin  was placed on hold.  His medical record was reviewed.  He does have  known aortic stenosis by exam as well as by 2-D echocardiogram, this was  felt to be moderate in nature.  He had a mean gradient of 25 with an  aortic valve area of 1.3 just recently this month.  By the time he was  seen on rounds on Monday morning, he had had recurrence of the  discomfort, but it occurred with turning over in the bed, he clearly had  palpable chest wall tenderness and at this point in time this discomfort  is felt to be more atypical for his heart pain, and he will be monitored  closely as an outpatient, and for now we will hold off on plans for  cardiac catheterization.  The patient's Coumadin was subsequently  resumed, his activity was increased, and tolerated, and on  the afternoon  of 11/05/2007, he was felt to be a satisfactory candidate for discharge.   DISCHARGE CONDITION:  Stable.   DISCHARGE DIET:  Heart-healthy.   DISCHARGE MEDICINES:  1. Amiodarone 200 mg a day.  2. Prilosec p.r.n.  3. Coumadin x4, 2.5 x3.  4. Coreg 3.125 b.i.d.  5. Synthroid 150 mcg a day.  6. Lipitor 10 a day.  7. Multivitamin daily.   We will plan on seeing him back in the office in 1 week.  We will  arrange for a repeat outpatient stress Cardiolite study in the interim  as well, and he is certainly to call if any problems arise in the  interim.      Sharlee Blew, N.P.      Colleen Can. Deborah Chalk, M.D.  Electronically Signed    LC/MEDQ  D:  11/05/2007  T:  11/06/2007  Job:  [id number]

## 2010-07-26 NOTE — H&P (Signed)
NAMEEDU, ON NO.:  [id number]   MEDICAL RECORD NO.:  [id number]          PATIENT TYPE:   LOCATION:                                 FACILITY:   PHYSICIAN:  Colleen Can. Deborah Chalk, M.D.    DATE OF BIRTH:   DATE OF ADMISSION:  09/02/2007  DATE OF DISCHARGE:                              HISTORY & PHYSICAL   CHIEF COMPLAINT:  Fatigue in the setting of recurrent atrial  fibrillation.   HISTORY OF PRESENT ILLNESS:  Mr. Schinke is a 77 year old white male who  has multiple medical problems.  He now presents for admission for  loading with amiodarone due to recurrent atrial fibrillation.  He was  seen in the office for his routine visit on August 29, 2007.  At that  time, he had some complaints of fatigue and not really doing very well.  He really had no awareness that he had had recurrent atrial  fibrillation.  The duration is unknown.  He is now referred for attempts  at restoring sinus rhythm with amiodarone and possible cardioversion.   PAST MEDICAL HISTORY:  1. Known ischemic heart disease with previous coronary artery bypass      grafting as well as redo bypass grafting.  He had remote myocardial      infarction in 1974 and well as in 1978.  He had his original      surgery in 1979 with Dr. Nathanial Rancher and then had subsequent redo      surgery in 1991.  His last catheterization occurred in 2003.  At      that time, he had severe LV dysfunction, totally occluded right      coronary, LAD, and left circumflex.  The saphenous vein graft to      the right coronary was patent, left internal mammary to the OM was      patent, and the right internal mammary to the LAD was patent.  2. History of ICD implantation, last performed in April 2005.  3. Chronic Coumadin therapy.  4. Hyperlipidemia.  5. History of myopathy.  6. History of pulmonary emboli.  7. History of prostate cancer.  He underwent radiation in 2008.  8. GERD.  9. Hypothyroidism.  10.Hypertension.  11.Previous history of stroke.  12.Cholecystectomy.  13.Bilateral shoulder surgery.  14.History of esophageal dilatation.   ALLERGIES:  Penicillin.   CURRENT MEDICATIONS:  Multivitamin daily, Lipitor 10 a day, Synthroid  150 mcg a day, Coreg 3.125 b.i.d., Coumadin 5 mg every day except 7.5 x1  day a week, and Prilosec p.r.n.   FAMILY HISTORY:  Noncontributory.   SOCIAL HISTORY:  He is retired.  He is married.  He has 2 sons and 3  daughters.  He does have occasional alcohol use.  He stopped smoking  over 30 years ago.   REVIEW OF SYSTEMS:  As noted above and is otherwise unremarkable.   PHYSICAL EXAMINATION:  GENERAL:  He is pleasant.  He is in no acute  distress.  VITAL SIGNS:  His weight is 224 pounds, blood pressure is 120/84  sitting, 110/88 standing, heart  rate is 64 irregular, and respirations  80.  He is afebrile.  SKIN:  Warm and dry.  Color is unremarkable.  LUNGS:  Clear.  HEART:  Irregular rhythm.  He does have a grade 3/6 systolic outflow  murmur.  ABDOMEN:  Soft.  Positive bowel sounds.  EXTREMITIES:  Without edema.  NEUROLOGIC:  No gross focal deficits.   OVERALL IMPRESSION:  1. Atrial fibrillation of uncertain duration.  2. Ischemic cardiomyopathy.  3. Functioning implantable cardioverter-defibrillator.  4. History of moderate aortic stenosis per 2-D echocardiogram in March      2009.  5. Hyperlipidemia.  6. Chronic Coumadin therapy.  7. History of prostate cancer.   PLAN:  He will be admitted to telemetry.  We will start him on  amiodarone.  We will make plans for cardioversion on Wednesday.  The  further treatment plan to follow per Dr. Ronnald Nian discretion.      Sharlee Blew, N.P.      Colleen Can. Deborah Chalk, M.D.  Electronically Signed    LC/MEDQ  D:  08/30/2007  T:  08/31/2007  Job:  [id number]

## 2010-07-26 NOTE — Cardiovascular Report (Signed)
NAMENOEL, RODIER NO.:  [id number]   MEDICAL RECORD NO.:  [id number]          PATIENT TYPE:  INP   LOCATION:  2029                         FACILITY:  [facility]   PHYSICIAN:  Colleen Can. Deborah Chalk, M.D.DATE OF BIRTH:  18-Feb-1933   DATE OF PROCEDURE:  09/04/2007  DATE OF DISCHARGE:                            CARDIAC CATHETERIZATION   PROCEDURE:  Cardioversion.   ANESTHESIA:  Judie Petit, MD   125 mg pentothal IV.   Cardioversion using anterior and posterior pads, 120 watt seconds of  biphasic energy was delivered with conversion to the normal sinus  rhythm.  The patient tolerated the procedure well.      Colleen Can. Deborah Chalk, M.D.  Electronically Signed     SNT/MEDQ  D:  09/04/2007  T:  09/05/2007  Job:  [id number]

## 2010-07-26 NOTE — H&P (Signed)
NAME:  [name], [name] NO.:  [id number]   MEDICAL RECORD NO.:  [id number]          PATIENT TYPE:  INP   LOCATION:  3703                         FACILITY:  [facility]   PHYSICIAN:  Peter M. Swaziland, M.D.  DATE OF BIRTH:  07/08/1932   DATE OF ADMISSION:  11/03/2007  DATE OF DISCHARGE:                              HISTORY & PHYSICAL   HISTORY OF PRESENT ILLNESS:  Mr. Rodkey is a pleasant 77 year old white  male with history of coronary artery disease and atrial fibrillation who  presents for evaluation of chest pain.  The patient states at 7 p.m.  last night, he had chest pain and was relieved with sublingual  nitroglycerin.  This morning, he awoke with chest pain again and again  got relief with sublingual nitroglycerin but that his pain recurred.  He  took 2 additional nitroglycerin and each time got relief but then his  pain would come back again.  He has continued to have low-grade chest  pain at this time.  He describes the pain is central almost in the bone.  He feels a pressure sensation.  There is no radiation, shortness of  breath, nausea, vomiting, or diaphoresis.  It is similar to anginal  symptoms he has had in the past.  He denies any shortness of breath or  edema.  The patient does have a history of coronary artery disease with  prior myocardial infarction in the past.  He underwent original bypass  surgery in 1979 and had a redo bypass surgery in 1991.  Last cardiac  catheterization in 2003 showed his grafts were patent.  He does have a  history of severe left ventricular dysfunction.  He has had a prior ICD  implant.  He is also status post cardioversion in June of this year for  atrial fibrillation and is now on amiodarone.   PAST MEDICAL HISTORY:  1. Coronary artery disease status post myocardial infarction in 1974      and 1978.  He is status post coronary bypass surgery in 1979 and      1991.  This surgery included LIMA graft to the OM, RIMA graft to     the LAD, and a saphenous vein graft to the right coronary artery.  2. Ischemic cardiomyopathy with ejection fraction of 20-25%.  3. Status post ICD implant.  4. Aortic stenosis.  5. Hyperlipidemia.  6. History of pulmonary emboli.  7. History of prostate cancer status post radiation therapy in 2008.  8. Gastroesophageal reflux disease status post esophageal dilatation.  9. Hypertension.  10.Hypothyroidism.  11.History of CVA.  12.Status post cholecystectomy.  13.Status post shoulder surgery.   ALLERGIES:  He is allergic to PENICILLIN.   CURRENT MEDICATIONS:  1. Amiodarone 200 mg per day.  2. Prilosec p.r.n.  3. Coumadin 5 mg 4 days a week, 2-1/2 mg 3 days a week.  4. Coreg 3.125 mg b.i.d.  5. Synthroid 150 mcg per day.  6. Lipitor 10 mg per day.  7. Multivitamin daily   SOCIAL HISTORY:  The patient is retired.  He is married.  He  has 5  children.  He quit smoking 30 years ago.  He drinks occasional alcohol.   FAMILY HISTORY:  Noncontributory.   REVIEW OF SYSTEMS:  Otherwise unremarkable in detail.   PHYSICAL EXAMINATION:  GENERAL:  The patient is pleasant white male in  no apparent distress.  VITAL SIGNS:  Blood pressure is 110/80, pulse is 49, sats were 98% on  room air.  He is afebrile.  HEENT:  He is normocephalic and atraumatic.  Pupils equal, round, and  reactive to light and accommodation.  Sclerae clear.  Oropharynx is  clear.  NECK:  Without JVD, adenopathy, thyromegaly, or bruits.  LUNGS:  Clear.  CARDIAC:  Irregular rate and rhythm with a grade 3/6 systolic murmur  heard best in the aortic area radiating to the apex.  ABDOMEN:  Soft, nontender without masses or bruits.  EXTREMITIES:  Without edema or phlebitis.  His pedal pulses are  positive.  NEUROLOGIC:  Intact.   LABORATORY DATA:  ECG shows sinus bradycardia with an old inferior  myocardial infarction.  His ST-T wave depression is consistent with  lateral ischemia.  Chest x-ray shows no active  disease.  White count  6500, hemoglobin 15.2, hematocrit 45.7, and platelets 220,000.  Sodium  is 136, potassium 4.4, chloride 102, CO2 27, BUN 13, creatinine 1.23,  and glucose of 109.  CK is 113 with 3.3 MB, troponin 1.01.  Protime is  26.9 with an INR of 2.3.  D-dimer is 0.22.   IMPRESSION:  1. Acute chest pain consistent with unstable angina pectoris.  2. Aortic stenosis by exam.  3. Coronary artery disease status post redo coronary artery bypass      surgery.  4. Atrial fibrillation status post cardioversion now on amiodarone.  5. Status post implantable cardioverter-defibrillator implant.  6. Hyperlipidemia.  7. History of prostate cancer.   PLAN:  The patient will be admitted to telemetry.  We will treat with IV  nitroglycerin, we will obtain serial cardiac enzymes, we will  tentatively hold his Coumadin in anticipation of possible cardiac  catheterization.  The patient did have an echocardiogram 2-3 weeks ago  and we will review those results.           ______________________________  Peter M. Swaziland, M.D.     PMJ/MEDQ  D:  11/03/2007  T:  11/04/2007  Job:  [id number]   cc:   Colleen Can. Deborah Chalk, M.D.

## 2010-07-26 NOTE — Assessment & Plan Note (Signed)
[facility]                         ELECTROPHYSIOLOGY OFFICE NOTE   NAME:Corpuz, [name]                        MRN:          [id number]  DATE:01/30/2007                            DOB:          07/07/1932    [name] has had no intercurrent therapies.  He has had multiple  episodes detected by his ICD as nonsustained ventricular tachycardia but  there are no electrograms for confirmation.  These have occurred in two  clusters, one of which was the week-and-a-half or so surrounding the  beginning of June, and the other was the third week in September.  According to his counters, there are more than 10 episodes a day for  about a week or two.   On both of these days also we see that there are very rapid average  heart rates during the day and night and I suspect that this represents  some degree of false positives.   He has also intercurrently been undergoing therapy for prostate cancer  and it is thought that those time frames correlate with the  aforementioned diagnosis and treatment strategies.   On examination, his blood pressure is 118/59 with a pulse of 75.  His  lungs were clear, his heart sounds were regular, the extremities were  warm without edema.   Interrogation of his Medtronic ICD demonstrates an R wave of 14.4 with  impedance of 632, a threshold of 1 volt at 0.2, battery voltage is 3.10.  There are no intercurrent therapies as noted.   IMPRESSION:  1. Ischemic heart disease.  2. Status post implantable cardioverter-defibrillator for primary      prevention with intercurrent appropriate therapies.  3. Episodes of multiple arrhythmias likely related to sinus      tachycardia.   [name] is doing tolerably as he goes through his radiation therapy  for his prostate cancer.   We will see him again in 1 year's time and he will be followed remotely  in the interim.     Duke Salvia, MD, [facility]  Electronically Signed    SCK/MedQ  DD: 01/30/2007  DT: 01/31/2007  Job #: [id number]   cc:   Colleen Can. Deborah Chalk, M.D.

## 2010-07-26 NOTE — Procedures (Signed)
CAROTID DUPLEX EXAM   INDICATION:  Preop for aortic valve replacement and carotid bruit.   HISTORY:  Diabetes:  No.  Cardiac:  Yes.  Hypertension:  Yes.  Smoking:  Quit.  Previous Surgery:  No.  CV History:  No.  Amaurosis Fugax No, Paresthesias No, Hemiparesis No                                       RIGHT             LEFT  Brachial systolic pressure:         141               130  Brachial Doppler waveforms:         Biphasic          Biphasic  Vertebral direction of flow:        Antegrade         Antegrade  DUPLEX VELOCITIES (cm/sec)  CCA peak systolic                   72                62  ECA peak systolic                   54                56  ICA peak systolic                   70                54  ICA end diastolic                   20                10  PLAQUE MORPHOLOGY:                  Heterogeneous     Heterogeneous  PLAQUE AMOUNT:                      Mild              Mild  PLAQUE LOCATION:                    ICA               ICA    IMPRESSION:  1. 1-39% stenosis noted in bilateral ICAs.  2. Antegrade bilateral vertebral arteries.       ___________________________________________  V. Charlena Cross, MD   MG/MEDQ  D:  08/13/2008  T:  08/13/2008  Job:  [id number]

## 2010-07-26 NOTE — Telephone Encounter (Signed)
Wanted to let us know that Dr. Sherre Poot put him on Align not an antibiotic. Is having a lot of GI problems. Will recheck INR on 5/17 and let us know.

## 2010-07-26 NOTE — Discharge Summary (Signed)
NAMEIHAN, PAT NO.:  [id number]   MEDICAL RECORD NO.:  [id number]          PATIENT TYPE:  INP   LOCATION:  2029                         FACILITY:  [facility]   PHYSICIAN:  Colleen Can. Deborah Chalk, M.D.DATE OF BIRTH:  12/17/1932   DATE OF ADMISSION:  09/02/2007  DATE OF DISCHARGE:  09/05/2007                               DISCHARGE SUMMARY   DISCHARGE DIAGNOSES:  1. Fatigue in the setting of recurrent atrial fibrillation, now loaded      with amiodarone and status post successful cardioversion back to      sinus rhythm.  2. Known ischemic heart disease with previous bypass surgery as well      as redo bypass surgery.  His last catheterization occurred in 2003      which demonstrated severe left ventricular dysfunction, totally      occluded right coronary left anterior descending and left      circumflex with patent saphenous vein graft to the right coronary,      patent left internal mammary to the obtuse marginal and patent      right internal mammary to the left anterior descending.  3. Functioning implantable cardioverter-defibrillator.  4. Chronic Coumadin.  5. Hyperlipidemia.  6. History of pulmonary emboli.  7. History of prostate cancer.  8. Gastroesophageal reflux disease.  9. Hypothyroidism.  10.Hypertension.  11.Prior stroke.   HISTORY OF PRESENT ILLNESS:  Leonel is a very pleasant 77 year old white  male who has multiple medical problems.  He presented to the office for  his routine visit on August 29, 2007, and at that time was complaining of  significant fatigue and lethargy.  He was noted to be in atrial  fibrillation of uncertain duration.  He was subsequently admitted for a  loading with amiodarone as well as to undergo cardioversion.   Please see the history and physical for further patient presentation and  profile.   LABORATORY DATA:  PFTs showed mild obstructive airway disease.  He had a  slight reduction in diffusion capacity which  indicated a minimal loss of  functional alveolar capillary surface.  His FEV-1 was normal.   BNP was 159.  CBC was normal.  Chemistries were normal except for a  glucose of 139.  INRs have been satisfactory at 2.2-2.1.   Chest X-Ray showed cardiac enlargement.  There was no overt edema.  He  did have small right pleural effusion.   HOSPITAL COURSE:  The patient was admitted electively.  He was started  on amiodarone which he tolerated very well.  He did not convert on his  own, so he underwent cardioversion on September 04, 2007.  The patient  received 125 mg of Sodium Pentothal for Anesthesia.  A 121-second  biphasic shock was performed which resulted in conversion to sinus  rhythm.  Postprocedure, he has done very well.  He already feels  considerably stronger.  He has been stable in sinus rhythm and today on  September 05, 2007, he was felt to be a satisfactory candidate for discharge.   DISCHARGE CONDITION:  Stable.   WOUND CARE:  He is to use hydrocortisone cream to the burn areas as  needed.   DISCHARGE MEDICINES:  1. Coumadin 5 mg a day except 7.5 on Tuesdays.  2. Coreg 3.125 b.i.d.  3. Synthroid 150 mcg a day.  4. Lipitor 10 mg a day.  5. Multivitamin daily.  6. Prilosec if needed.  7. Amiodarone 200 mg b.i.d.   We will have him follow up in our office in 1-2 weeks.  We will also be  checking a pro-time this coming Monday in our office as well.  He is to  call for any problems in the interim.      Sharlee Blew, N.P.      Colleen Can. Deborah Chalk, M.D.  Electronically Signed    LC/MEDQ  D:  09/05/2007  T:  09/05/2007  Job:  [id number]

## 2010-07-27 ENCOUNTER — Ambulatory Visit (HOSPITAL_COMMUNITY): Payer: Medicare Other

## 2010-07-27 ENCOUNTER — Encounter (HOSPITAL_COMMUNITY): Payer: Medicare Other

## 2010-07-29 ENCOUNTER — Ambulatory Visit (HOSPITAL_COMMUNITY): Payer: Medicare Other

## 2010-07-29 ENCOUNTER — Encounter (HOSPITAL_COMMUNITY): Payer: Medicare Other

## 2010-07-29 NOTE — Cardiovascular Report (Signed)
[place]  Patient:    CAMP, GOPAL Visit Number: [id number] MRN: [id number]          Service Type: CAT Location: [facility] 2870 01 Attending Physician:  Eleanora Neighbor Dictated by:   Colleen Can. Deborah Chalk, M.D. Proc. Date: 04/12/01 Admit Date:  04/12/2001 Discharge Date: 04/12/2001                          Cardiac Catheterization  HISTORY: The patient had previous coronary artery bypass grafting and re-do surgery. He presents with recurrent angina. He has known left ventricular dysfunction.  PROCEDURE: Left heart catheterization with selective coronary angiography, left ventricular angiography, saphenous vein graft angiography, angiography of the right internal mammary graft, and angiography of the left internal mammary graft.  TYPE AND SITE OF ENTRY: Percutaneous right femoral artery with Perclose.  CATHETERS: A 6 French 4 curved Judkins right and left coronary catheters, 6 French pigtail ventriculographic catheter, left internal mammary graft catheter, attempts to selectively cannulate the right internal mammary artery including the right 4, right 5, right bypass graft catheter, left internal mammary graft catheter and a No Torque right catheter.  CONTRAST MATERIAL: Omnipaque.  COMMENTS: The patient tolerated the procedure well. Vancomycin was given because of the Perclose.  HEMODYNAMIC DATA: The aortic pressure was 124/74-94. LV was 152/14-25. The aortic pressure on pullback showed a 4 to 10 mm peak-to-peak gradient on pullback.  ANGIOGRAPHIC DATA: 1. Left main coronary artery is normal. 2. The left circumflex continues as two large obtuse marginals.  The second    obtuse marginal is totally occluded (it is bypassed distally).  There is a    proximal 70-80% narrowing in a small branch. The first obtuse marginal    is smaller in size. There is one branch of this vessel, which has severe    stenosis. However, the main portion of  the first obtuse marginal was    free of significant disease. 3. Left anterior descending is totally occluded after the first diagonal    vessel. The first diagonal vessel is free of significant disease. After    occlusion of the left anterior descending there are scantly collaterals    to originally large septal perforating branch. 4. The right coronary artery is totally occluded proximally. 5. Saphenous vein graft: The saphenous vein graft to the right coronary    artery is a large graft with a smooth body and nice insertion. In the    posterior descending distal to the insertion, there is a 90% stenosis and    there is distal disease present. In the right coronary artery, there is    retrograde fill beyond the acute margin and then there is a right    ventricular branch that fills in a retrograde fashion and does have    somewhat diffuse disease. The continuation branch of the right coronary    artery to the left ventricular wall is free of significant disease. 6. Right internal mammary graft to the LAD is widely patent. It has nice    insertion and fills the left anterior descending well. This is best    demonstrated in a flush injection. One cannot tell if there is distal    disease in the native left anterior descending from these flush injections,    but multiple attempts were made to selectively cannulate the right internal    mammary artery graft without success. 7. Left internal mammary graft to the obtuse marginal  is widely patent with    a nice insertion and good distal runoff. There is moderate distal disease    but no significant focal stenosis.  LEFT VENTRICULOGRAPHY: Left ventricular angiogram was performed in the RAO position.  Overall cardiac size and silhouette are somewhat enlarged. There was global hypokinesia with a global ejection fraction estimated to be approximately 20-25%. There is inferior basilar akinesis predominantly, hypokinesis of the distal inferior wall  apex and anterior wall. The anterobasilar portion of the heart similarly contracted well.  OVERALL IMPRESSION: 1. Severe left ventricular dysfunction with increased left ventricular    filling pressure but with no mitral regurgitation. 2. Totally occluded native right coronary artery, left anterior descending,    and left circumflex with patent saphenous vein graft to the right coronary    artery, patent left internal mammary artery to the obtuse marginal, patent    right internal mammary artery to the left anterior descending.  DISCUSSION: In light of these findings, it is felt that we could manage him medically. He will be encouraged to have an ICD placed. Dictated by:   Colleen Can Deborah Chalk, M.D. Attending Physician:  Eleanora Neighbor DD:  04/12/01 TD:  04/13/01 Job: [id number]

## 2010-07-29 NOTE — Op Note (Signed)
[place]  Patient:    OJAS, COONE Visit Number: [id number] MRN: [id number]          Service Type: CAT Location: [facility] 2870 01 Attending Physician:  Eleanora Neighbor Dictated by:   Nathen May, M.D., [facility] Admit Date:  04/12/2001 Discharge Date: 04/12/2001   CC:         Colleen Can. Deborah Chalk, M.D.  [facility]  EP Laboratory  Mr. Clarion Mooneyhan, [address],  [place], [place] [address]   Operative Report  PREOPERATIVE DIAGNOSIS:  Ventricular tachycardia, ischemic heart disease.  PREOPERATIVE DIAGNOSIS:  Ventricular tachycardia, ischemic heart disease.  PROCEDURE:  Single-chamber defibrillator implantation with intraoperative defibrillation threshold testing.  DESCRIPTION OF PROCEDURE:  Following obtaining informed consent, the patient was brought to the electrophysiology laboratory and placed on the fluoroscopic table in the supine position.  After routine prep and drape of the left upper chest, lidocaine was infiltrated in the prepectoral supraclavicular region. An incision was made and carried down to the layer of the prepectoral fascia using electrocautery.  A pocket was formed using electrocautery.  Hemostasis was obtained.  Thereafter, attention was turned to gaining access to the external thoracic left subclavian vein which was accomplished without difficulty and without _______.  There was no aspiration of air.  There was puncture of the artery.  A 0 silk suture was placed in a figure-of-eight fashion around the retained guide wire and allowed to hang loosely.  Subsequently, a 9-French tear-away introducer sheath was placed through which was passed a Medtronic Quattro passive fixation ventricular dual-coil defibrillator lead, Model [id number], Serial [id number].  Under fluoroscopic guidance, it was manipulated to the left ventricular apex where the bipolar R wave was 13.2 mV with a pacing  impedance of 1400 ohms with 18 threshold at 0.5 seconds and 0.6 volts, and a current of threshold of 0.5 mA.  There was no diaphragmatic pacing at 10 volts.  With these acceptable parameters recorded, the lead was secured to the prepectoral fascia and then attached to a Medtronic [facility], Serial #[id number].  Through this device, the bipolar R wave was 9 mechanical ventilation and impedance of 1360 ohms with pacing high-voltage impedance of 59 ohms in both coils, and the pacing threshold of 0.5 volts at 0.2 msec.  The pocket was then copiously irrigated with antibiotic and saline-containing solution.  Hemostasis was assured, and the lead and the pulse generator then placed in the pocket.  Defibrillation threshold testing was then undertaken.  Ventricular fibrillation was induced using a T wave shock.  After a total duration of 6 seconds, a 12 joule shock was delivered through a measured resistance of 44 ohms, terminating ventricular fibrillation and restoring sinus rhythm.  After a wait of 5-6 minutes, ventricular fibrillation was reintroduced using a T wave shock.  After a total duration of 5 seconds, a 12 joule shock was delivered through a measured resistance of 42 ohms, terminating ventricular fibrillation and restoring sinus rhythm.  With these acceptable parameters recorded, the system was implanted. Hemostasis was again assured.  The leads and pulse generator were then secured to the prepectoral fascia.  The wound was closed in three layers in the normal fashion.  The wound was washed, dried, and benzoin and Steri-Strip dressing was then applied.  The needle counts, sponge counts, and instrument counts were correct at the end of the procedure according to the staff.  The patient tolerated the procedure well  without apparent complication. Dictated by:   Nathen May, M.D., [facility] Attending Physician:  Eleanora Neighbor DD:  05/02/01 TD:  05/02/01 Job:  [id number]

## 2010-07-29 NOTE — Letter (Signed)
January 31, 2006    Colleen Can. Deborah Chalk, M.D.  [address]  [place]  [place] [address]   RE:  [name], [name]  MRN:  [id number]  /  DOB:  Mar 21, 1932   Dear Weyman Croon,   [name] came in today for followup of his ICD.  He is doing well without  any intercurrent therapies.  He has no complaints of chest pain or shortness  of breath.   On examination, his blood pressure is mildly elevated at 142/82.  His pulse  was 58.  Heart rate was regular.   Interrogation of his Medtronic [phone] ICD demonstrates an R wave of 15.5 with  impedence of 712, threshold with a 1 volt of 0.2 with a high voltage  impedence of 47 ohm.  There are no intercurrent therapies, as noted.  Battery voltage is 3.14.   IMPRESSION:  1. Ischemic cardiomyopathy.  2. Status post implantable cardioverter/defibrillator for primary      prevention with appropriate subsequent ventricular tachycardia therapy.  3. Relatively elevated blood pressure.  4. Previous intolerance to higher doses of Coreg.   [name], [name], is stable from an arrhythmia point of view.  With his  blood pressure going up, I wonder whether he might be a candidate for ACE or  ARB therapy.  He was not sure if he had  been on it before.  I suggested that he speak with you about it when he sees  you again in December.   Hope you have a good Thanksgiving.    Sincerely,      Duke Salvia, MD, [facility]  Electronically Signed    SCK/MedQ  DD: 01/31/2006  DT: 01/31/2006  Job #: [id number]

## 2010-07-29 NOTE — H&P (Signed)
[place]  Patient:    [name], [name] Visit Number: [id number] MRN: [id number]          Service Type: CAT Location: [facility] 2870 01 Attending Physician:  Eleanora Neighbor Dictated by:   Jennet Maduro Earl Gala, R.N., A.N.P. Admit Date:  04/12/2001 Discharge Date: 04/12/2001   CC:         Dr. Chinita Greenland, [address], [place], [place] [address]                         History and Physical  CHIEF COMPLAINT:  Recurrent chest pain.  HISTORY OF PRESENT ILLNESS:  [name] is a 77 year old white male who has multiple medical problems.  He has recently returned from [place] due to the onset of chest discomfort with exertion.  He has been seen and evaluated in our office on January 27 for evaluation.  Over the past two weeks, he has had a substernal chest discomfort which has improved.  It was exertional in nature.  He does have known coronary artery disease.  A 12 lead electrocardiogram was performed which shows evidence of previous inferior myocardial infarction with nonspecific ST and T-wave changes.  He is now referred for elective cardiac catheterization.  PAST MEDICAL HISTORY:  1. Atherosclerotic cardiovascular disease.  He has had previous coronary     artery bypass grafting in [place] in 1978 with a graft to the LAD,     sequential graft to the acute marginal and right coronary artery.  The     right graft occluded early on causing a diaphragmatic myocardial     infarction.  He had repeat catheterization and subsequent redo coronary     artery bypass grafting x 3 with right internal mammary to the LAD, left     internal mammary to the obtuse marginal and saphenous vein graft to the     PDA in January of 1991.  2. Previous atrial fibrillation.  3. Chronic Coumadin therapy.  4. Hypercoagulable syndrome of uncertain etiology.  5. Severe left ventricular dysfunction with ejection fraction of 26%.  6. History of aortic insufficiency.  7.  Recurrent arterial embolism with previous hospitalization.  8. Prior cholecystectomy associated with blurred vision with Coumadin     discontinuation.  9. Hypothyroidism. 10. Hypercholesterolemia. 11. Gastroesophageal reflux disease. 12. Previous bilateral shoulder surgery. 13. Osteoarthritis.  ALLERGIES:  PENICILLIN.  INTOLERANCES:  Amiodarone.  CURRENT MEDICATIONS: 1. Coumadin 7.5 mg x 3, 5 mg x 4. 2. Synthroid 150 mcg q.d. 3. Carvedilol 3.125 b.i.d. 4. Multivitamin daily. 5. Prilosec 20 mg p.r.n. 6. Lipitor 10 mg q.d. 7. Isosorbide dosage strength unknown daily.  FAMILY HISTORY:  Positive for coronary artery disease.  SOCIAL HISTORY:  Married.  There is no current alcohol or tobacco abuse.  REVIEW OF SYSTEMS:  Basically as noted above and otherwise, unremarkable.  PHYSICAL EXAMINATION:  GENERAL:  This is a pleasant, elderly white male in no acute distress.  VITAL SIGNS:  Blood pressure 130/70 sitting and 120/70 standing.  Heart rate is 60.  Respirations are 18 and he is afebrile.  SKIN:  The skin is warm and dry.  Color unremarkable.  NECK:  Supple.  LUNGS:  Basically clear.  CARDIAC:  Examination shows a regular rhythm, no murmur could be appreciated.  ABDOMEN:  Soft, positive bowel sounds and nontender.  EXTREMITIES:  Without edema.  NEUROLOGIC:  Intact, no gross focal deficits.  LABORATORY DATA:  Pending.  OVERALL IMPRESSION:  1. Recurrent chest pain in setting of extensive coronary artery disease    with previous history of bypass surgery as well as subsequent redo    coronary artery bypass graft in 1991. 2. Coagulopathy of uncertain etiology. 3. Chronic Coumadin therapy. 4. Previous atrial fibrillation. 5. Severe LV dysfunction.  PLAN:  Will proceed on with elective cardiac catheterization.  The patients Coumadin will be held two to three days prior to the procedure.  Further treatment plan to follow-up per Dr. Angelina Pih discretion. Dictated by:    Jennet Maduro Earl Gala, R.N., A.N.P. Attending Physician:  Eleanora Neighbor DD:  04/10/01 TD:  04/10/01 Job: [id number]

## 2010-07-29 NOTE — Discharge Summary (Signed)
[facility]  Patient:    [name], [name] Visit Number: [id number] MRN: [id number]          Service Type: CAT Location: [id number] Attending Physician:  Nathen May Dictated by:   Chinita Pester, C.R.N.P. Admit Date:  05/02/2001 Discharge Date: 05/03/2001   CC:         Colleen Can. Deborah Chalk, M.D.  Nathen May, M.D., [facility]  [facility], [place]   Discharge Summary  PRIMARY DIAGNOSIS:  Ischemic cardiomyopathy.  SECONDARY DIAGNOSES: 1. Hypothyroidism. 2. Atrial fibrillation.  HISTORY OF PRESENT ILLNESS:  This is a 77 year old gentleman, patient of Dr. Angelina Pih, with a history of ischemic heart disease, prior bypass with a re-do in 1991, at that point receiving a RIMA to his LAD and a LIMA to his OM and a vein graft to the PD.  He has had moderate aortic insufficiency with an ejection fraction of 25%.  He had had recent increase in exertional chest pain and underwent catheterization demonstrating patent grafts.  Also has a history of atrial fibrillation, being treated with clinical ______ and amiodarone. He is not on any of these medications at the present time and has not had any recurrences.  His past medical history is significant for blurry vision following discontinuation of Coumadin around the time of his cholecystectomy. Recently, however, with his catheterization, he was taken off Coumadin without difficulty.  States ALLERGIES to HEPARIN, PENICILLIN.  The patient was admitted for placement of an ICD device.  He was admitted on May 02, 2001.  He tolerated the procedure well and had no immediate postop complications.  Chest x-ray showed the leads to be in place, and lead interrogation R wave was 11.0 mV, 1016 ohms.  Ventricular threshold was 1 volt at .2 msec.  DISCHARGE MEDICATIONS: 1. Coated aspirin 5 mg q.d. except Tuesday, Thursday, Saturday 7.5 mg. 2. Coreg 3.125 mg b.i.d. 3. Synthroid 130 mcg q.d. 4.  Imdur 30 mg q.d. 5. Lipitor 10 mg q.d. 6. Multivitamin q.d. 7. Prilosec 20 mg p.r.n. q.d.  DISCHARGE INSTRUCTIONS: 1. He was instructed if his defibrillator fired one time and he was okay to    call the office.  One time and he was not okay, twice in a row, or three    times in one day to call 911. 2. He was instructed not to lift his left arm above his head for one week and    gradually raise as depicted on the discharge summary. 3. No heavy lifting or strenuous activity with his left arm for 4-6 weeks. 4. Low fat, low cholesterol, low salt diet. 5. He was not to get his wound wet for one week. 6. A follow-up appointment was scheduled with Dr. Graciela Husbands on May 14, 2001, at    11:30 a.m., and he was to have an ICD check within three months. Dictated by:   Chinita Pester, C.R.N.P. Attending Physician:  Nathen May DD:  05/03/01 TD:  05/04/01 Job: [id number]

## 2010-07-29 NOTE — Op Note (Signed)
[name], [name] NO.:  [id number]   MEDICAL RECORD NO.:  [id number]                   PATIENT TYPE:  OIB   LOCATION:  2899                                 FACILITY:  [facility]   PHYSICIAN:  Duke Salvia, M.D.               DATE OF BIRTH:  1932-03-31   DATE OF PROCEDURE:  07/06/2003  DATE OF DISCHARGE:                                 OPERATIVE REPORT   PREOPERATIVE DIAGNOSIS:  Previously implanted defibrillator with ventricular  tachycardia therapy, device on advisory recall.   POSTOPERATIVE DIAGNOSIS:  Previously implanted defibrillator with  ventricular tachycardia therapy, device on advisory recall.   PROCEDURE PERFORMED:  Explantation of previously implanted device with  implantation of a new device and intraoperative defibrillation threshold  testing.   DESCRIPTION OF PROCEDURE:  Following the obtaining of informed consent, the  patient was brought to the electrophysiology laboratory and placed on the  fluoroscopic table in supine position.  After routine prep and drape of the  left upper chest, lidocaine was infiltrated along the line of previous  incision and carried down to the layer of the defibrillator pocket using  sharp dissection and electrocautery.  The pocket was opened.  The device was  freed up and the device was explanted.   Evaluation of the previously implanted defibrillator lead, a Sprint Quattro  [id number], serial number [id number] demonstrated an R wave of  29.9 mV with a  pacing impedance of 831 ohms, a pacing threshold 0.7 V at 0.5 msec with  current at threshold 0.9 mA.  With these acceptable parameters recorded, the  lead was attached to a Medtronic Maxima  [id number] implantable cardioverter-  defibrillator serial number [id number].  Through the device a bipolar R-wave  of 17 mV with a pacing impedance of 744 ohms, pacing threshold of 1V at 0.2  msec with a high voltage impedance of 66 ohms, a proximal coil impedance of  53  ohms.   With these acceptable parameters recorded, defibrillation threshold testing  was undertaken.  Ventricular fibrillation was induced via the T-wave shock.  After a total duration of seven seconds, a 12.3 joule shock was delivered  through a measured resistance of 46 ohms terminating ventricular  fibrillation restoring sinus rhythm.  With these acceptable parameters  recorded, the system was implanted.  The pocket was copiously irrigated with  antibiotic containing saline solution, hemostasis was assured.  The leads  and the pulse generator were then placed in the pocket, secured to the  prepectoral fascia and the wound was then closed in three layers in normal  fashion.  The wound was washed, dried, and a benzoin and Steri-Strip  dressing was applied.  Sponge, needle and instrument counts were correct at  the end of the procedure according to the staff.  The patient tolerated the  procedure without apparent complication.  Duke Salvia, M.D.   SCK/MEDQ  D:  07/06/2003  T:  07/06/2003  Job:  [id number]   cc:   Colleen Can. Deborah Chalk, M.D.  Fax: [phone]

## 2010-08-01 ENCOUNTER — Encounter (HOSPITAL_COMMUNITY): Payer: Medicare Other | Attending: Cardiology

## 2010-08-01 ENCOUNTER — Ambulatory Visit (HOSPITAL_COMMUNITY): Payer: Medicare Other

## 2010-08-01 ENCOUNTER — Other Ambulatory Visit: Payer: Self-pay | Admitting: *Deleted

## 2010-08-01 ENCOUNTER — Ambulatory Visit (INDEPENDENT_AMBULATORY_CARE_PROVIDER_SITE_OTHER): Payer: Medicare Other | Admitting: Cardiology

## 2010-08-01 ENCOUNTER — Encounter: Payer: Self-pay | Admitting: Cardiology

## 2010-08-01 DIAGNOSIS — I4891 Unspecified atrial fibrillation: Secondary | ICD-10-CM

## 2010-08-01 DIAGNOSIS — Z95810 Presence of automatic (implantable) cardiac defibrillator: Secondary | ICD-10-CM | POA: Insufficient documentation

## 2010-08-01 DIAGNOSIS — I359 Nonrheumatic aortic valve disorder, unspecified: Secondary | ICD-10-CM | POA: Insufficient documentation

## 2010-08-01 DIAGNOSIS — I2589 Other forms of chronic ischemic heart disease: Secondary | ICD-10-CM | POA: Insufficient documentation

## 2010-08-01 DIAGNOSIS — R0602 Shortness of breath: Secondary | ICD-10-CM

## 2010-08-01 DIAGNOSIS — Z951 Presence of aortocoronary bypass graft: Secondary | ICD-10-CM | POA: Insufficient documentation

## 2010-08-01 DIAGNOSIS — I472 Ventricular tachycardia, unspecified: Secondary | ICD-10-CM | POA: Insufficient documentation

## 2010-08-01 DIAGNOSIS — Z5189 Encounter for other specified aftercare: Secondary | ICD-10-CM | POA: Insufficient documentation

## 2010-08-01 DIAGNOSIS — I4729 Other ventricular tachycardia: Secondary | ICD-10-CM | POA: Insufficient documentation

## 2010-08-02 ENCOUNTER — Telehealth: Payer: Self-pay | Admitting: Cardiology

## 2010-08-02 ENCOUNTER — Encounter: Payer: Self-pay | Admitting: Cardiology

## 2010-08-02 ENCOUNTER — Other Ambulatory Visit (INDEPENDENT_AMBULATORY_CARE_PROVIDER_SITE_OTHER): Payer: Medicare Other | Admitting: *Deleted

## 2010-08-02 ENCOUNTER — Ambulatory Visit: Payer: Medicare Other | Admitting: Cardiology

## 2010-08-02 ENCOUNTER — Ambulatory Visit (INDEPENDENT_AMBULATORY_CARE_PROVIDER_SITE_OTHER): Payer: Medicare Other | Admitting: Cardiology

## 2010-08-02 VITALS — BP 110/60 | HR 72 | Ht 75.5 in | Wt 219.1 lb

## 2010-08-02 DIAGNOSIS — R0602 Shortness of breath: Secondary | ICD-10-CM

## 2010-08-02 DIAGNOSIS — I359 Nonrheumatic aortic valve disorder, unspecified: Secondary | ICD-10-CM

## 2010-08-02 DIAGNOSIS — Z952 Presence of prosthetic heart valve: Secondary | ICD-10-CM

## 2010-08-02 DIAGNOSIS — Z79899 Other long term (current) drug therapy: Secondary | ICD-10-CM

## 2010-08-02 LAB — BASIC METABOLIC PANEL
BUN: 15 mg/dL (ref 6–23)
Chloride: 104 mEq/L (ref 96–112)
Creatinine, Ser: 1.2 mg/dL (ref 0.4–1.5)
Glucose, Bld: 78 mg/dL (ref 70–99)
Potassium: 4.3 mEq/L (ref 3.5–5.1)

## 2010-08-02 LAB — CBC WITH DIFFERENTIAL/PLATELET
Basophils Absolute: 0 10*3/uL (ref 0.0–0.1)
Eosinophils Absolute: 0.2 10*3/uL (ref 0.0–0.7)
HCT: 37.3 % — ABNORMAL LOW (ref 39.0–52.0)
Hemoglobin: 12.7 g/dL — ABNORMAL LOW (ref 13.0–17.0)
Lymphs Abs: 1 10*3/uL (ref 0.7–4.0)
MCHC: 34.1 g/dL (ref 30.0–36.0)
MCV: 90.6 fl (ref 78.0–100.0)
Monocytes Absolute: 0.6 10*3/uL (ref 0.1–1.0)
Monocytes Relative: 9.7 % (ref 3.0–12.0)
Neutro Abs: 3.9 10*3/uL (ref 1.4–7.7)
Platelets: 156 10*3/uL (ref 150.0–400.0)
RDW: 14.6 % (ref 11.5–14.6)

## 2010-08-02 NOTE — Telephone Encounter (Signed)
DR. Rosalyn Charters OFFICE REQUESTING RECORDS FAXED TO [phone] FORMER PATIENT OF DR. Deborah Chalk

## 2010-08-03 ENCOUNTER — Encounter: Payer: Self-pay | Admitting: Cardiology

## 2010-08-03 ENCOUNTER — Encounter (HOSPITAL_COMMUNITY): Payer: Medicare Other

## 2010-08-03 ENCOUNTER — Ambulatory Visit (HOSPITAL_COMMUNITY): Payer: Medicare Other

## 2010-08-03 NOTE — Assessment & Plan Note (Signed)
Continues on his anticoagulation regimen and we have increased carvedilol. For now, I'll allow him to continue to participate in cardiac rehabilitation. I like to get a 2-D echocardiogram here in [place] in the first week of June. I will have him see Dr. Riley Kill in approximately 3 weeks to begin followup with Dr. Riley Kill.

## 2010-08-03 NOTE — Progress Notes (Signed)
Subjective:   [name] is seen back in office today for followup after his percutaneous aortic valve placed in the [facility] clinic on May 25, 2010. Due to the high placement of the first valve, a second valve was placed. He has a resultant 1-2+ aortic insufficiency but is had dramatic symptomatic improvement. He's currently in cardiac rehabilitation.  We have been gradually trying to increase his carvedilol and overall, he's tolerating that well with some slowing of his ventricular response to his atrial fibrillation but not enough to create a paced rhythm from his ICD. In general, he has had nice slow steady gradual progression. He is on aspirin, Plavix, and warfarin and we've been able to maintain his INR very close to the 2.1 range which is our goal. He does do home warfarin management.  He is in chronic atrial fibrillation. He was on amiodarone but that was stopped in September 2010.  Current Outpatient Prescriptions  Medication Sig Dispense Refill  . alfuzosin (UROXATRAL) 10 MG 24 hr tablet Take 10 mg by mouth daily.        Marland Kitchen aspirin 81 MG chewable tablet Chew 81 mg by mouth daily.        Marland Kitchen atorvastatin (LIPITOR) 10 MG tablet Take 10 mg by mouth daily.        . carvedilol (COREG) 3.125 MG tablet Take 12.5 mg by mouth 2 (two) times daily with a meal.       . ciprofloxacin (CIPRO) 500 MG tablet Take 500 mg by mouth daily.        . clopidogrel (PLAVIX) 75 MG tablet Take 75 mg by mouth daily.        Marland Kitchen DOCUSATE SODIUM PO Take by mouth daily.        Marland Kitchen enoxaparin (LOVENOX) 100 MG/ML SOLN Inject into the skin every 12 (twelve) hours.        . FUROSEMIDE PO Take 1 tablet by mouth. Take 1 tablet every three days       . levothyroxine (SYNTHROID, LEVOTHROID) 150 MCG tablet Take 150 mcg by mouth daily.        . Multiple Vitamin (MULTIVITAMIN) tablet Take 1 tablet by mouth daily.        Marland Kitchen omeprazole (PRILOSEC OTC) 20 MG tablet Take 20 mg by mouth as needed.        . psyllium (METAMUCIL) 58.6 % powder  Take 1 packet by mouth as needed.        . ranitidine (ZANTAC) 300 MG tablet Take 1 tablet (300 mg total) by mouth daily.  30 tablet  11  . warfarin (COUMADIN) 5 MG tablet Take 5 mg by mouth daily. AS DIRECTED          Allergies  Allergen Reactions  . Antihistamines, Diphenhydramine-Type   . Penicillins     Patient Active Problem List  Diagnoses  . VENTRICULAR TACHYCARDIA  . ATRIAL FIBRILLATION  . OTHER SYMPTOMS INVOLVING CARDIOVASCULAR SYSTEM  . AUTOMATIC IMPLANTABLE CARDIAC DEFIBRILLATOR SITU  . S/P aortic valve replacement  . Indigestion  . Ischemic cardiomyopathy  . Diverticulitis  . AF (atrial fibrillation)  . Chronic anticoagulation  . ICD (implantable cardiac defibrillator) in place  . Prostate cancer  . Osteoarthritis  . Aortic stenosis, severe  . MI (myocardial infarction)  . Cervical myelopathy  . Pulmonary embolism  . Hyperlipidemia  . GERD (gastroesophageal reflux disease)  . Esophageal stricture    History  Smoking status  . Former Smoker  . Types: Cigarettes  . Quit date:  06/15/1969  Smokeless tobacco  . Never Used    History  Alcohol Use No    Family History  Problem Relation Age of Onset  . Heart disease Mother 58  . Diabetes Mother 83  . Heart disease Father 56    Review of Systems:   The patient denies any heat or cold intolerance.  No weight gain or weight loss.  The patient denies headaches or blurry vision.  There is no cough or sputum production.  The patient denies dizziness.  There is no hematuria or hematochezia.  The patient denies any muscle aches or arthritis.  The patient denies any rash.  The patient denies frequent falling or instability.  There is no history of depression or anxiety.  All other systems were reviewed and are negative.   Physical Exam:   Vital signs are reviewed.blood pressure is 110/70. Ventricular response to his atrial fibrillation 72 per minute. He has no S3. He does have a grade 2/6 murmur of aortic  insufficiency. He is a regular rate and rhythm. Lungs are clear. There is no lower extremity edema Assessment / Plan:

## 2010-08-04 ENCOUNTER — Other Ambulatory Visit: Payer: Self-pay

## 2010-08-04 ENCOUNTER — Ambulatory Visit (INDEPENDENT_AMBULATORY_CARE_PROVIDER_SITE_OTHER): Payer: Medicare Other | Admitting: *Deleted

## 2010-08-04 DIAGNOSIS — I428 Other cardiomyopathies: Secondary | ICD-10-CM

## 2010-08-05 ENCOUNTER — Ambulatory Visit (HOSPITAL_COMMUNITY): Payer: Medicare Other

## 2010-08-05 ENCOUNTER — Encounter (HOSPITAL_COMMUNITY): Payer: Medicare Other

## 2010-08-08 ENCOUNTER — Encounter: Payer: Self-pay | Admitting: Cardiology

## 2010-08-08 ENCOUNTER — Ambulatory Visit (HOSPITAL_COMMUNITY): Payer: Medicare Other

## 2010-08-08 ENCOUNTER — Encounter (HOSPITAL_COMMUNITY): Payer: Medicare Other

## 2010-08-09 ENCOUNTER — Other Ambulatory Visit (HOSPITAL_COMMUNITY): Payer: Medicare Other | Admitting: Radiology

## 2010-08-10 ENCOUNTER — Encounter (HOSPITAL_COMMUNITY): Payer: Medicare Other

## 2010-08-10 ENCOUNTER — Telehealth: Payer: Self-pay | Admitting: Cardiology

## 2010-08-10 ENCOUNTER — Telehealth: Payer: Self-pay | Admitting: *Deleted

## 2010-08-10 ENCOUNTER — Ambulatory Visit (HOSPITAL_COMMUNITY): Payer: Medicare Other

## 2010-08-10 NOTE — Telephone Encounter (Signed)
PT SAID HE WAS SPEAKING WITH AMY AND SHE WAS GOING TO MAKE AN APPT WITH DR Riley Kill. PT SAID HE THOUGHT HE ALREADY HAD ONE. PLEASE CALL BACK.

## 2010-08-10 NOTE — Telephone Encounter (Signed)
Labs reported 

## 2010-08-10 NOTE — Telephone Encounter (Signed)
L/M to call back regarding lab work 

## 2010-08-12 ENCOUNTER — Ambulatory Visit (HOSPITAL_COMMUNITY): Payer: Medicare Other

## 2010-08-12 ENCOUNTER — Encounter (HOSPITAL_COMMUNITY): Payer: Medicare Other | Attending: Cardiology

## 2010-08-12 ENCOUNTER — Other Ambulatory Visit (HOSPITAL_COMMUNITY): Payer: Self-pay | Admitting: Cardiology

## 2010-08-12 DIAGNOSIS — I359 Nonrheumatic aortic valve disorder, unspecified: Secondary | ICD-10-CM | POA: Insufficient documentation

## 2010-08-12 DIAGNOSIS — I472 Ventricular tachycardia, unspecified: Secondary | ICD-10-CM | POA: Insufficient documentation

## 2010-08-12 DIAGNOSIS — Z952 Presence of prosthetic heart valve: Secondary | ICD-10-CM

## 2010-08-12 DIAGNOSIS — Z951 Presence of aortocoronary bypass graft: Secondary | ICD-10-CM | POA: Insufficient documentation

## 2010-08-12 DIAGNOSIS — Z95810 Presence of automatic (implantable) cardiac defibrillator: Secondary | ICD-10-CM | POA: Insufficient documentation

## 2010-08-12 DIAGNOSIS — I4729 Other ventricular tachycardia: Secondary | ICD-10-CM | POA: Insufficient documentation

## 2010-08-12 DIAGNOSIS — Z5189 Encounter for other specified aftercare: Secondary | ICD-10-CM | POA: Insufficient documentation

## 2010-08-12 DIAGNOSIS — I2589 Other forms of chronic ischemic heart disease: Secondary | ICD-10-CM | POA: Insufficient documentation

## 2010-08-12 DIAGNOSIS — I4891 Unspecified atrial fibrillation: Secondary | ICD-10-CM | POA: Insufficient documentation

## 2010-08-15 ENCOUNTER — Encounter: Payer: Self-pay | Admitting: Cardiology

## 2010-08-15 ENCOUNTER — Ambulatory Visit (INDEPENDENT_AMBULATORY_CARE_PROVIDER_SITE_OTHER): Payer: Medicare Other | Admitting: *Deleted

## 2010-08-15 ENCOUNTER — Encounter (HOSPITAL_COMMUNITY): Payer: Medicare Other

## 2010-08-15 ENCOUNTER — Ambulatory Visit (HOSPITAL_COMMUNITY): Payer: Medicare Other | Attending: Cardiology

## 2010-08-15 ENCOUNTER — Ambulatory Visit (HOSPITAL_COMMUNITY): Payer: Medicare Other

## 2010-08-15 DIAGNOSIS — Z954 Presence of other heart-valve replacement: Secondary | ICD-10-CM | POA: Insufficient documentation

## 2010-08-15 DIAGNOSIS — I080 Rheumatic disorders of both mitral and aortic valves: Secondary | ICD-10-CM | POA: Insufficient documentation

## 2010-08-15 DIAGNOSIS — I4891 Unspecified atrial fibrillation: Secondary | ICD-10-CM | POA: Insufficient documentation

## 2010-08-15 DIAGNOSIS — I359 Nonrheumatic aortic valve disorder, unspecified: Secondary | ICD-10-CM

## 2010-08-15 DIAGNOSIS — I079 Rheumatic tricuspid valve disease, unspecified: Secondary | ICD-10-CM | POA: Insufficient documentation

## 2010-08-15 DIAGNOSIS — Z952 Presence of prosthetic heart valve: Secondary | ICD-10-CM

## 2010-08-15 DIAGNOSIS — I259 Chronic ischemic heart disease, unspecified: Secondary | ICD-10-CM

## 2010-08-15 LAB — POCT INR: INR: 2.1

## 2010-08-17 ENCOUNTER — Ambulatory Visit (HOSPITAL_COMMUNITY): Payer: Medicare Other

## 2010-08-17 ENCOUNTER — Encounter (HOSPITAL_COMMUNITY): Payer: Medicare Other

## 2010-08-17 NOTE — Progress Notes (Signed)
Patient called with lab results. Pt verbalized understanding. Jodette Juley Giovanetti RN  

## 2010-08-18 ENCOUNTER — Encounter: Payer: Self-pay | Admitting: *Deleted

## 2010-08-19 ENCOUNTER — Encounter (HOSPITAL_COMMUNITY): Payer: Medicare Other

## 2010-08-19 ENCOUNTER — Ambulatory Visit (HOSPITAL_COMMUNITY): Payer: Medicare Other

## 2010-08-22 ENCOUNTER — Telehealth: Payer: Self-pay | Admitting: Cardiology

## 2010-08-22 ENCOUNTER — Encounter (HOSPITAL_COMMUNITY): Payer: Medicare Other

## 2010-08-22 ENCOUNTER — Ambulatory Visit: Payer: Self-pay | Admitting: Cardiology

## 2010-08-22 ENCOUNTER — Ambulatory Visit (HOSPITAL_COMMUNITY): Payer: Medicare Other

## 2010-08-22 LAB — POCT INR: INR: 1.9

## 2010-08-22 NOTE — Telephone Encounter (Signed)
INR was 1.9 taken today

## 2010-08-22 NOTE — Telephone Encounter (Signed)
See anticoagulation encounter

## 2010-08-22 NOTE — Patient Instructions (Signed)
Pt instructed to decrease Coumadin dose accordingly. Pt verbalized to RN understanding of instructions.  Pt will check INR at home in one week.

## 2010-08-24 ENCOUNTER — Ambulatory Visit (HOSPITAL_COMMUNITY): Payer: Medicare Other

## 2010-08-24 ENCOUNTER — Encounter (HOSPITAL_COMMUNITY): Payer: Medicare Other

## 2010-08-26 ENCOUNTER — Encounter (HOSPITAL_COMMUNITY): Payer: Medicare Other

## 2010-08-26 ENCOUNTER — Ambulatory Visit (HOSPITAL_COMMUNITY): Payer: Medicare Other

## 2010-08-29 ENCOUNTER — Encounter (HOSPITAL_COMMUNITY): Payer: Medicare Other

## 2010-08-29 ENCOUNTER — Ambulatory Visit (HOSPITAL_COMMUNITY): Payer: Medicare Other

## 2010-08-29 ENCOUNTER — Ambulatory Visit: Payer: Self-pay | Admitting: Nurse Practitioner

## 2010-08-29 LAB — POCT INR: INR: 2.4

## 2010-08-29 NOTE — Patient Instructions (Signed)
Pt verbalized to RN understanding of Coumadin instructions.   

## 2010-08-31 ENCOUNTER — Encounter (HOSPITAL_COMMUNITY): Payer: Medicare Other

## 2010-08-31 ENCOUNTER — Ambulatory Visit (HOSPITAL_COMMUNITY): Payer: Medicare Other

## 2010-08-31 ENCOUNTER — Telehealth: Payer: Self-pay | Admitting: *Deleted

## 2010-08-31 NOTE — Telephone Encounter (Signed)
Discussed with Tresa Endo.  I told her at that time we did not start new patients on home monitoring.  I did not realize patient had been monitoring at home for several years.  I spoke with the patient.  He is aware he can continue self checking.  I will contact [employer] and have them send the INR results to our office.  Pt checks every Monday.  Will let Tresa Endo with Dr. Deborah Chalk know.

## 2010-08-31 NOTE — Telephone Encounter (Signed)
Form received and discussed with Weston Brass, PharmD in coumadin clinic.  Per Kennon Rounds we do not do home monitoring in our office. I called and spoke with Tresa Endo at [facility] and gave her this information and transferred her to Coumadin Clinic to discuss.

## 2010-08-31 NOTE — Telephone Encounter (Signed)
Received call from Tresa Endo, RN at [facility]. Pt is former pt of Dr Ronnald Nian and is scheduled to see Dr. Riley Kill tomorrow.  Pt checks INR at home every Monday via [employer] home monitoring. Tresa Endo is calling to let us know she will be faxing paperwork to Dr. Riley Kill to complete as he is now pt's attending cardiologist.  Will look for fax and also notify Coumadin clinic.

## 2010-09-01 ENCOUNTER — Encounter: Payer: Self-pay | Admitting: Cardiology

## 2010-09-01 ENCOUNTER — Ambulatory Visit (INDEPENDENT_AMBULATORY_CARE_PROVIDER_SITE_OTHER): Payer: Medicare Other | Admitting: Cardiology

## 2010-09-01 VITALS — BP 109/71 | HR 83 | Resp 18 | Ht 75.0 in | Wt 220.0 lb

## 2010-09-01 DIAGNOSIS — I359 Nonrheumatic aortic valve disorder, unspecified: Secondary | ICD-10-CM

## 2010-09-01 DIAGNOSIS — I2589 Other forms of chronic ischemic heart disease: Secondary | ICD-10-CM

## 2010-09-01 DIAGNOSIS — I472 Ventricular tachycardia: Secondary | ICD-10-CM

## 2010-09-01 DIAGNOSIS — I255 Ischemic cardiomyopathy: Secondary | ICD-10-CM

## 2010-09-01 DIAGNOSIS — Z952 Presence of prosthetic heart valve: Secondary | ICD-10-CM

## 2010-09-01 DIAGNOSIS — I2699 Other pulmonary embolism without acute cor pulmonale: Secondary | ICD-10-CM

## 2010-09-01 DIAGNOSIS — I4891 Unspecified atrial fibrillation: Secondary | ICD-10-CM

## 2010-09-01 NOTE — Patient Instructions (Signed)
Your physician recommends that you schedule a follow-up appointment in: 6 weeks with Dr. Stuckey. 

## 2010-09-02 ENCOUNTER — Encounter (HOSPITAL_COMMUNITY): Payer: Medicare Other

## 2010-09-02 ENCOUNTER — Ambulatory Visit (HOSPITAL_COMMUNITY): Payer: Medicare Other

## 2010-09-05 ENCOUNTER — Telehealth: Payer: Self-pay | Admitting: Cardiology

## 2010-09-05 ENCOUNTER — Encounter (HOSPITAL_COMMUNITY): Payer: Medicare Other

## 2010-09-05 ENCOUNTER — Ambulatory Visit (INDEPENDENT_AMBULATORY_CARE_PROVIDER_SITE_OTHER): Payer: Self-pay | Admitting: Cardiology

## 2010-09-05 ENCOUNTER — Ambulatory Visit (HOSPITAL_COMMUNITY): Payer: Medicare Other

## 2010-09-05 DIAGNOSIS — R0989 Other specified symptoms and signs involving the circulatory and respiratory systems: Secondary | ICD-10-CM

## 2010-09-05 LAB — POCT INR: INR: 2.2

## 2010-09-05 NOTE — Telephone Encounter (Signed)
This patient has been reassigned to Dr.Stuckey and said that his PT results are supposed to be called in to him.  He said that the results cannot be called in to [facility] because they only have [place]'s name as the ordering provider.  Patient would like to talk to you about this.

## 2010-09-05 NOTE — Telephone Encounter (Signed)
Needed to know if Dr Riley Kill is monitoring PT/INR's now.  Pt is scheduled to see Dr Riley Kill as Dr Deborah Chalk has retired.  Monitoring company is faxing a new order that needs to be signed and faxed back to them.  They will fax it to Kennon Rounds in the Coumadin Clinic

## 2010-09-05 NOTE — Telephone Encounter (Signed)
[name] with [employer] would like a call re this pt

## 2010-09-05 NOTE — Telephone Encounter (Signed)
Spoke with [employer] [facility] and they will be sending all future INR readings to Dr. Riley Kill.  Pt aware that Dr. Riley Kill will be managing all future INR readings.

## 2010-09-06 ENCOUNTER — Encounter: Payer: Self-pay | Admitting: Cardiology

## 2010-09-07 ENCOUNTER — Encounter: Payer: Self-pay | Admitting: Cardiology

## 2010-09-07 ENCOUNTER — Encounter (HOSPITAL_COMMUNITY): Payer: Medicare Other

## 2010-09-07 ENCOUNTER — Ambulatory Visit (HOSPITAL_COMMUNITY): Payer: Medicare Other

## 2010-09-09 ENCOUNTER — Ambulatory Visit (HOSPITAL_COMMUNITY): Payer: Medicare Other

## 2010-09-09 ENCOUNTER — Encounter (HOSPITAL_COMMUNITY): Payer: Medicare Other

## 2010-09-12 ENCOUNTER — Encounter (HOSPITAL_COMMUNITY): Payer: Medicare Other | Attending: Cardiology

## 2010-09-12 ENCOUNTER — Ambulatory Visit (HOSPITAL_COMMUNITY): Payer: Medicare Other

## 2010-09-12 DIAGNOSIS — I2589 Other forms of chronic ischemic heart disease: Secondary | ICD-10-CM | POA: Insufficient documentation

## 2010-09-12 DIAGNOSIS — I359 Nonrheumatic aortic valve disorder, unspecified: Secondary | ICD-10-CM | POA: Insufficient documentation

## 2010-09-12 DIAGNOSIS — Z95810 Presence of automatic (implantable) cardiac defibrillator: Secondary | ICD-10-CM | POA: Insufficient documentation

## 2010-09-12 DIAGNOSIS — Z951 Presence of aortocoronary bypass graft: Secondary | ICD-10-CM | POA: Insufficient documentation

## 2010-09-12 DIAGNOSIS — I4891 Unspecified atrial fibrillation: Secondary | ICD-10-CM | POA: Insufficient documentation

## 2010-09-12 DIAGNOSIS — I4729 Other ventricular tachycardia: Secondary | ICD-10-CM | POA: Insufficient documentation

## 2010-09-12 DIAGNOSIS — I472 Ventricular tachycardia, unspecified: Secondary | ICD-10-CM | POA: Insufficient documentation

## 2010-09-12 DIAGNOSIS — Z5189 Encounter for other specified aftercare: Secondary | ICD-10-CM | POA: Insufficient documentation

## 2010-09-12 LAB — POCT INR: INR: 2.1

## 2010-09-12 NOTE — Assessment & Plan Note (Signed)
See Deborah Chalk assessment note.  His records have been reviewed in detail.  We will try to make contact with Dr. Jettie Booze before he returns so there is clarity about his anticoagulation.  His echo EF has risen substantially, and he has essentially gone from Class IV to class II-III.  Had two Sapien Edwards placed due to positioning, with improvedment as noted.  I would continue his current regimen, and we will sit down at his next visit and again review with him all of his records for confirmation.  Total note time for review and documentation greater than 1 hour.

## 2010-09-12 NOTE — Progress Notes (Signed)
HPI:  [name] is seen in continuing follow up from Dr. Deborah Chalk.  He underwent TAVR at the [facility] and has had a pretty impressive response.  The wait time to get the procedure done was rather extensive.  Weyman Croon performed an echocardiogram to assess response in the past few weeks prior to his retirement, and listed is the result.  He has been under the care of Dr. Jettie Booze.  His exam in early May listed a 2/6 early diastolic decrescendo murmur, which is confirmed today.  He has persistent atrial fib and been on anticoagulation in addition to antiplatelet therapy.  We discussed trying to determine the length of these treatments in combination, but he is scheduled to go back up and see Dr. Jettie Booze on follow up at the Clinic.  Notably, he does home monitoring of his anticoagulation, and has been in contact with Dr. Abner Greenspan in our coumadin clinic so that may continue. During his procedure, he had two valves placed.  Mr. Nam has also had prior pulmonary emboli as well as CVA times two.  AICD have been placed by Dr. Graciela Husbands.    Study Conclusions  08/15/2010  - Left ventricle: Wall thickness was increased in a pattern of moderate LVH. Systolic function was moderately to severely reduced. The estimated ejection fraction was in the range of 30% to 35%. Akinesis of the mid-distal anteroseptal myocardium; consistent with infarction. - Aortic valve: Mildly to moderately calcified annulus. Mild regurgitation. - Pulmonary arteries: PA peak pressure: 34mm Hg (S).  12/27/2009  Study Conclusions  - Left ventricle: The cavity size was moderately dilated. Wall thickness was normal. The estimated ejection fraction was 15%. Diffuse hypokinesis. There is akinesis of the apical myocardium; consistent with infarction. Doppler parameters are consistent with abnormal left ventricular relaxation (grade 1 diastolic dysfunction). - Aortic valve: Moderately calcified annulus. Probably trileaflet. There was moderate  stenosis. Mild regurgitation. Valve area: 0.94cm^2(VTI). Valve area: 0.84cm^2 (Vmax). - Mitral valve: Mild regurgitation. - Left atrium: The atrium was mildly dilated.  Last cath September 2010 by Dr. Deborah Chalk:   HEMODYNAMIC DATA:  The right atrial pressure A-wave of 6, V-wave of 5,   mean of 4.  RV was 24/4.  Pulmonary artery pressure was 20/9, mean of   13.  Pulmonary capillary wedge pressure A-wave of 7, V-wave of 6, mean   of 4.  Cardiac output by thermodilution was 4.17 with a cardiac index of   1.87.  Fick cardiac output was 5.6 with a cardiac index of 2.5.  The   aortic pressure was 109/52, LV pressure was 134/6-10.  There was a 26-mm   peak-to-peak gradient across the aortic valve with a mean gradient of 20   mmHg.  The aortic saturation was 99%.  The pulmonary artery saturation   was 72%.      ANGIOGRAPHIC DATA:  The left ventricular angiogram was performed in the   RAO position.  The overall cardiac size was enlarged.  There was a   global hypokinesis with an ejection fraction of 15-20%.  There was no   significant mitral regurgitation.      CORONARY ARTERIES:   1. The left main coronary artery is essentially normal.   2. Left circumflex:  Left circumflex has 40-50% narrowing in the       midportion.  There is the first obtuse marginal vessel that is       small with mild disease.  The second obtuse marginal is totally  occluded.   3. Left anterior descending is totally occluded after the small first       diagonal vessel.  First diagonal vessel is relatively small with       mild-to-moderate disease and no significant focal obstruction.   4. Right coronary artery is totally occluded proximally.   5. The left internal mammary artery to the obtuse marginal is widely       patent with an excellent insertion and good distal runoff.   6. The saphenous vein graft to the posterior descending artery is       widely patent with a nice insertion and good distal runoff.       The right internal mammary artery was difficult to visualize.  We were   not able to subselect the right internal mammary artery in spite of   using multiple catheters.  The right internal mammary artery is   definitely patent and has a nice insertion into the distal left anterior   descending with satisfactory flow in a somewhat narrowed small vessel   distally.      OVERALL IMPRESSION:   1. Moderately severe aortic stenosis with a calculated aortic valve       area of 1.3 centimeter square based upon thermodilution cardiac       output of 4.17 with a mean gradient of 20 mmHg.   2. Severe left ventricular dysfunction with normal left ventricular       filling pressures.   3. Proximally occluded right coronary artery with satisfactory       saphenous vein graft distally; totally occluded left anterior       descending in the midportion with satisfactory right internal       mammary graft to the distal left anterior descending; distal       occlusion of the second obtuse marginal of the left circumflex with       satisfactory left internal mammary graft to the distal obtuse       Marginal.  Last perfusion data:  02/09/2010:   Overall Impression   Exercise Capacity: Lexiscan with no exercise. BP Response: Normal blood pressure response. Clinical Symptoms: Short of breath.  ECG Impression: Atrial fibrillation without significant change with infusion.  Overall Impression: Large, severe fixed inferior perfusion defect.  Severe mid to apical anterior perfusion defect.  Dilated LV.  Overall Impression Comments: Infarction with minimal evidence for ischemia.   Carotid dopplers were obtained in 2011 and did not demonstrate high grade carotid disease.  They cannot be transferred. Device is a single lead ICD Medtronic defib and is followed by Dr. Berton Mount in our EP clinic.            Current Outpatient Prescriptions  Medication Sig Dispense Refill  . alfuzosin (UROXATRAL) 10 MG  24 hr tablet Take 10 mg by mouth daily.        Marland Kitchen aspirin 81 MG chewable tablet Chew 81 mg by mouth daily.       Marland Kitchen atorvastatin (LIPITOR) 10 MG tablet Take 10 mg by mouth daily.        . carvedilol (COREG) 12.5 MG tablet Take 12.5 mg by mouth 2 (two) times daily with a meal.        . clopidogrel (PLAVIX) 75 MG tablet Take 75 mg by mouth daily.        Marland Kitchen DOCUSATE SODIUM PO Take by mouth daily.        . furosemide (LASIX) 20 MG tablet 20 mg.  1 tab every three days       . levothyroxine (SYNTHROID, LEVOTHROID) 150 MCG tablet Take 150 mcg by mouth daily.        . Multiple Vitamin (MULTIVITAMIN) tablet Take 1 tablet by mouth daily.        . Probiotic Product (ALIGN PO) Take by mouth. 1 tab daily       . psyllium (METAMUCIL) 58.6 % powder Take 1 packet by mouth as needed.        . ranitidine (ZANTAC) 300 MG tablet Take 1 tablet (300 mg total) by mouth daily.  30 tablet  11  . warfarin (COUMADIN) 5 MG tablet Take 5 mg by mouth daily. AS DIRECTED        . ciprofloxacin (CIPRO) 500 MG tablet Take 500 mg by mouth daily.        Marland Kitchen enoxaparin (LOVENOX) 100 MG/ML SOLN Inject into the skin every 12 (twelve) hours.        . FUROSEMIDE PO Take 1 tablet by mouth. Take 1 tablet every three days       . omeprazole (PRILOSEC OTC) 20 MG tablet Take 20 mg by mouth as needed.          Allergies  Allergen Reactions  . Antihistamines, Diphenhydramine-Type   . Penicillins     Past Medical History  Diagnosis Date  . Ischemic cardiomyopathy 05/2010    WITH PERCUTANEOUS AORTIC VALVE   . Diverticulitis     CURRENTLY CONTROLLED WITH NO EVIDENCE OF RECURRENT INFECTION  . AF (atrial fibrillation)   . Chronic anticoagulation   . ICD (implantable cardiac defibrillator) in place   . Prostate cancer   . Osteoarthritis     RIGHT KNEE  . Aortic stenosis, severe   . MI (myocardial infarction) 1974, 27  . Cervical myelopathy   . Pulmonary embolism   . Hyperlipidemia   . GERD (gastroesophageal reflux disease)   .  Esophageal stricture     WITH DILATATION    Past Surgical History  Procedure Date  . Insert / replace / remove pacemaker   . Icd   . Coronary artery bypass graft 1979  . Coronary artery bypass graft 1991    REDO SURGERY  . Cardiac catheterization 2010    SEVERE LV DYSFUNCTION WITH ESTIMATED EJECTION FRACTION OF 25%  . Cholecystectomy     Family History  Problem Relation Age of Onset  . Heart disease Mother 74  . Diabetes Mother 77  . Heart disease Father 70    History   Social History  . Marital Status: Married    Spouse Name: N/A    Number of Children: N/A  . Years of Education: N/A   Occupational History  . Not on file.   Social History Main Topics  . Smoking status: Former Smoker    Types: Cigarettes    Quit date: 06/15/1969  . Smokeless tobacco: Never Used  . Alcohol Use: No  . Drug Use: No  . Sexually Active: Not on file   Other Topics Concern  . Not on file   Social History Narrative  . No narrative on file    ROS: Please see the HPI.  All other systems reviewed and negative.  PHYSICAL EXAM:  BP 109/71  Pulse 83  Resp 18  Ht 6\' 3"  (1.905 m)  Wt 220 lb (99.791 kg)  BMI 27.50 kg/m2  General: Well developed, well nourished, in no acute distress. Head:  Normocephalic and atraumatic. Neck: no JVD Lungs:  Clear to auscultation and percussion. Heart: Normal S1 and S2.  No murmur, rubs or gallops.  Abdomen:  Normal bowel sounds; soft; non tender; no organomegaly Pulses: Pulses normal in all 4 extremities. Extremities: No clubbing or cyanosis. No edema. Neurologic: Alert and oriented x 3.  EKG:  ASSESSMENT AND PLAN:

## 2010-09-12 NOTE — Assessment & Plan Note (Signed)
Will review history.  Remains on anticoagulation life long for atrial fibrillation.  Will need to remain in the discussion should issues of anticoagulation rise in the future as he ages.

## 2010-09-12 NOTE — Assessment & Plan Note (Signed)
Rate is controlled.  On chronic warfarin with home monitoring.  He is knowledgeable about this.

## 2010-09-12 NOTE — Assessment & Plan Note (Signed)
He seems to be holding his own.  He was pretty dramatically improved after his implant and continues to make progress in rehab.  Would encourage continuing in this arena.

## 2010-09-12 NOTE — Assessment & Plan Note (Signed)
Medtronic single lead ICD per Dr. Graciela Husbands.

## 2010-09-13 ENCOUNTER — Ambulatory Visit (INDEPENDENT_AMBULATORY_CARE_PROVIDER_SITE_OTHER): Payer: Self-pay | Admitting: Internal Medicine

## 2010-09-13 ENCOUNTER — Ambulatory Visit: Payer: Self-pay | Admitting: *Deleted

## 2010-09-13 DIAGNOSIS — Z7901 Long term (current) use of anticoagulants: Secondary | ICD-10-CM | POA: Insufficient documentation

## 2010-09-13 DIAGNOSIS — R0989 Other specified symptoms and signs involving the circulatory and respiratory systems: Secondary | ICD-10-CM

## 2010-09-13 NOTE — Progress Notes (Signed)
Pt seeing Dr. Jearld Pies for all further INR monitoring

## 2010-09-14 ENCOUNTER — Ambulatory Visit (HOSPITAL_COMMUNITY): Payer: Medicare Other

## 2010-09-14 ENCOUNTER — Encounter (HOSPITAL_COMMUNITY): Payer: Medicare Other

## 2010-09-15 NOTE — Progress Notes (Signed)
ICD remote 

## 2010-09-16 ENCOUNTER — Ambulatory Visit (HOSPITAL_COMMUNITY): Payer: Medicare Other

## 2010-09-16 ENCOUNTER — Encounter (HOSPITAL_COMMUNITY): Payer: Medicare Other

## 2010-09-19 ENCOUNTER — Encounter (HOSPITAL_COMMUNITY): Payer: Medicare Other

## 2010-09-19 ENCOUNTER — Ambulatory Visit (HOSPITAL_COMMUNITY): Payer: Medicare Other

## 2010-09-21 ENCOUNTER — Other Ambulatory Visit: Payer: Self-pay | Admitting: *Deleted

## 2010-09-21 ENCOUNTER — Ambulatory Visit (HOSPITAL_COMMUNITY): Payer: Medicare Other

## 2010-09-21 ENCOUNTER — Encounter (HOSPITAL_COMMUNITY): Payer: Medicare Other

## 2010-09-21 MED ORDER — ATORVASTATIN CALCIUM 10 MG PO TABS: 10.0000 mg | ORAL_TABLET | Freq: Every day | ORAL | Status: DC

## 2010-09-23 ENCOUNTER — Encounter (HOSPITAL_COMMUNITY): Payer: Medicare Other

## 2010-09-23 ENCOUNTER — Ambulatory Visit (HOSPITAL_COMMUNITY): Payer: Medicare Other

## 2010-09-26 ENCOUNTER — Ambulatory Visit (HOSPITAL_COMMUNITY): Payer: Medicare Other

## 2010-09-26 ENCOUNTER — Encounter (HOSPITAL_COMMUNITY): Payer: Medicare Other

## 2010-09-27 ENCOUNTER — Ambulatory Visit (INDEPENDENT_AMBULATORY_CARE_PROVIDER_SITE_OTHER): Payer: Self-pay | Admitting: Cardiovascular Disease

## 2010-09-27 DIAGNOSIS — R0989 Other specified symptoms and signs involving the circulatory and respiratory systems: Secondary | ICD-10-CM

## 2010-09-28 ENCOUNTER — Encounter (HOSPITAL_COMMUNITY): Payer: Medicare Other

## 2010-09-28 ENCOUNTER — Ambulatory Visit (HOSPITAL_COMMUNITY): Payer: Medicare Other

## 2010-09-30 ENCOUNTER — Ambulatory Visit (HOSPITAL_COMMUNITY): Payer: Medicare Other

## 2010-09-30 ENCOUNTER — Encounter (HOSPITAL_COMMUNITY): Payer: Medicare Other

## 2010-10-03 ENCOUNTER — Encounter (HOSPITAL_COMMUNITY): Payer: Medicare Other

## 2010-10-03 ENCOUNTER — Ambulatory Visit (HOSPITAL_COMMUNITY): Payer: Medicare Other

## 2010-10-03 LAB — PROTIME-INR

## 2010-10-04 ENCOUNTER — Ambulatory Visit: Payer: Medicare Other | Admitting: Cardiology

## 2010-10-04 ENCOUNTER — Ambulatory Visit (INDEPENDENT_AMBULATORY_CARE_PROVIDER_SITE_OTHER): Payer: Self-pay | Admitting: Cardiology

## 2010-10-04 DIAGNOSIS — R0989 Other specified symptoms and signs involving the circulatory and respiratory systems: Secondary | ICD-10-CM

## 2010-10-05 ENCOUNTER — Ambulatory Visit (HOSPITAL_COMMUNITY): Payer: Medicare Other

## 2010-10-05 ENCOUNTER — Encounter (HOSPITAL_COMMUNITY): Payer: Medicare Other

## 2010-10-07 ENCOUNTER — Ambulatory Visit (HOSPITAL_COMMUNITY): Payer: Medicare Other

## 2010-10-07 ENCOUNTER — Encounter (HOSPITAL_COMMUNITY): Payer: Medicare Other

## 2010-10-10 ENCOUNTER — Encounter: Payer: Self-pay | Admitting: Cardiology

## 2010-10-10 ENCOUNTER — Encounter (HOSPITAL_COMMUNITY): Payer: Medicare Other

## 2010-10-10 ENCOUNTER — Ambulatory Visit (HOSPITAL_COMMUNITY): Payer: Medicare Other

## 2010-10-10 ENCOUNTER — Ambulatory Visit (INDEPENDENT_AMBULATORY_CARE_PROVIDER_SITE_OTHER): Payer: Medicare Other | Admitting: Cardiology

## 2010-10-10 DIAGNOSIS — I472 Ventricular tachycardia, unspecified: Secondary | ICD-10-CM

## 2010-10-10 DIAGNOSIS — I2699 Other pulmonary embolism without acute cor pulmonale: Secondary | ICD-10-CM

## 2010-10-10 DIAGNOSIS — I2589 Other forms of chronic ischemic heart disease: Secondary | ICD-10-CM

## 2010-10-10 DIAGNOSIS — I4891 Unspecified atrial fibrillation: Secondary | ICD-10-CM

## 2010-10-10 DIAGNOSIS — R0602 Shortness of breath: Secondary | ICD-10-CM

## 2010-10-10 DIAGNOSIS — I219 Acute myocardial infarction, unspecified: Secondary | ICD-10-CM

## 2010-10-10 DIAGNOSIS — I255 Ischemic cardiomyopathy: Secondary | ICD-10-CM

## 2010-10-10 DIAGNOSIS — Z952 Presence of prosthetic heart valve: Secondary | ICD-10-CM

## 2010-10-10 DIAGNOSIS — E785 Hyperlipidemia, unspecified: Secondary | ICD-10-CM

## 2010-10-10 LAB — BASIC METABOLIC PANEL
BUN: 15 mg/dL (ref 6–23)
Calcium: 9.2 mg/dL (ref 8.4–10.5)
Creatinine, Ser: 1.1 mg/dL (ref 0.4–1.5)
GFR: 69.08 mL/min (ref >60.00)

## 2010-10-10 LAB — BRAIN NATRIURETIC PEPTIDE: Pro B Natriuretic peptide (BNP): 294 pg/mL — ABNORMAL HIGH (ref 0.0–100.0)

## 2010-10-11 ENCOUNTER — Ambulatory Visit (INDEPENDENT_AMBULATORY_CARE_PROVIDER_SITE_OTHER): Payer: Self-pay | Admitting: Internal Medicine

## 2010-10-11 DIAGNOSIS — R0989 Other specified symptoms and signs involving the circulatory and respiratory systems: Secondary | ICD-10-CM

## 2010-10-11 NOTE — Telephone Encounter (Signed)
No note necessary for this encounter. °

## 2010-10-12 ENCOUNTER — Ambulatory Visit (HOSPITAL_COMMUNITY): Payer: Medicare Other

## 2010-10-12 ENCOUNTER — Encounter (HOSPITAL_COMMUNITY): Payer: Medicare Other | Attending: Cardiology

## 2010-10-12 DIAGNOSIS — I4891 Unspecified atrial fibrillation: Secondary | ICD-10-CM | POA: Insufficient documentation

## 2010-10-12 DIAGNOSIS — I472 Ventricular tachycardia, unspecified: Secondary | ICD-10-CM | POA: Insufficient documentation

## 2010-10-12 DIAGNOSIS — I4729 Other ventricular tachycardia: Secondary | ICD-10-CM | POA: Insufficient documentation

## 2010-10-12 DIAGNOSIS — Z5189 Encounter for other specified aftercare: Secondary | ICD-10-CM | POA: Insufficient documentation

## 2010-10-12 DIAGNOSIS — Z95810 Presence of automatic (implantable) cardiac defibrillator: Secondary | ICD-10-CM | POA: Insufficient documentation

## 2010-10-12 DIAGNOSIS — Z951 Presence of aortocoronary bypass graft: Secondary | ICD-10-CM | POA: Insufficient documentation

## 2010-10-12 DIAGNOSIS — I359 Nonrheumatic aortic valve disorder, unspecified: Secondary | ICD-10-CM | POA: Insufficient documentation

## 2010-10-12 DIAGNOSIS — I2589 Other forms of chronic ischemic heart disease: Secondary | ICD-10-CM | POA: Insufficient documentation

## 2010-10-14 ENCOUNTER — Ambulatory Visit (HOSPITAL_COMMUNITY): Payer: Medicare Other

## 2010-10-14 ENCOUNTER — Encounter (HOSPITAL_COMMUNITY): Payer: Medicare Other

## 2010-10-17 ENCOUNTER — Ambulatory Visit (INDEPENDENT_AMBULATORY_CARE_PROVIDER_SITE_OTHER): Payer: Self-pay | Admitting: Cardiology

## 2010-10-17 ENCOUNTER — Ambulatory Visit (HOSPITAL_COMMUNITY): Payer: Medicare Other

## 2010-10-17 ENCOUNTER — Encounter (HOSPITAL_COMMUNITY): Payer: Medicare Other

## 2010-10-17 DIAGNOSIS — R0989 Other specified symptoms and signs involving the circulatory and respiratory systems: Secondary | ICD-10-CM

## 2010-10-19 ENCOUNTER — Ambulatory Visit (HOSPITAL_COMMUNITY): Payer: Medicare Other

## 2010-10-19 ENCOUNTER — Encounter (HOSPITAL_COMMUNITY): Payer: Medicare Other

## 2010-10-21 ENCOUNTER — Encounter (HOSPITAL_COMMUNITY): Payer: Medicare Other

## 2010-10-21 ENCOUNTER — Ambulatory Visit (HOSPITAL_COMMUNITY): Payer: Medicare Other

## 2010-10-22 DIAGNOSIS — Z952 Presence of prosthetic heart valve: Secondary | ICD-10-CM | POA: Insufficient documentation

## 2010-10-22 NOTE — Assessment & Plan Note (Signed)
Rate is well controlled at this point in time.  Tolerates pretty well, much better since AVR.

## 2010-10-22 NOTE — Assessment & Plan Note (Signed)
Will get this next time he comes to the office.

## 2010-10-22 NOTE — Assessment & Plan Note (Addendum)
He is doing well.  Will have another echo done in [place].  Quite pleased with progress. We will pull back on plavix in September.  Until then, he knows not to take.

## 2010-10-22 NOTE — Assessment & Plan Note (Signed)
Home coumadin checks.  Monitors himself carefully.

## 2010-10-22 NOTE — Progress Notes (Signed)
HPI:  [name] is in for follow up.  He notices a rather dramatic improvement from the time of his percutaneous valve implant.  I spoke with the cardiologist in [place], at the [facility], and he reviewed his reports and said that he would need DAPT for a total of six months, recognizing that there is not a solid recommendation at this time.  I passed that on to [name], and he will be seen in [place] in a fairly short period of time.  His breath is better.  He is going to get into the maintenance program, and thinks all of this has been a life saver from the standpoint of quality of life.    Current Outpatient Prescriptions  Medication Sig Dispense Refill  . alfuzosin (UROXATRAL) 10 MG 24 hr tablet Take 10 mg by mouth daily.        Marland Kitchen aspirin 81 MG chewable tablet Chew 81 mg by mouth daily.       Marland Kitchen atorvastatin (LIPITOR) 10 MG tablet Take 1 tablet (10 mg total) by mouth daily.  30 tablet  5  . carvedilol (COREG) 12.5 MG tablet Take 12.5 mg by mouth 2 (two) times daily with a meal.        . clopidogrel (PLAVIX) 75 MG tablet Take 75 mg by mouth daily.        Marland Kitchen DOCUSATE SODIUM PO Take by mouth daily.        . FUROSEMIDE PO Take 1 tablet by mouth. Take 1 tablet every three days       . levothyroxine (SYNTHROID, LEVOTHROID) 150 MCG tablet Take 150 mcg by mouth daily.        . Multiple Vitamin (MULTIVITAMIN) tablet Take 1 tablet by mouth daily.        . psyllium (METAMUCIL) 58.6 % powder Take 1 packet by mouth as needed.        . ranitidine (ZANTAC) 300 MG tablet Take 1 tablet (300 mg total) by mouth daily.  30 tablet  11  . warfarin (COUMADIN) 5 MG tablet Take 5 mg by mouth daily. AS DIRECTED          Allergies  Allergen Reactions  . Antihistamines, Diphenhydramine-Type   . Penicillins     Past Medical History  Diagnosis Date  . Ischemic cardiomyopathy 05/2010    WITH PERCUTANEOUS AORTIC VALVE   . Diverticulitis     CURRENTLY CONTROLLED WITH NO EVIDENCE OF RECURRENT INFECTION    . AF (atrial fibrillation)   . Chronic anticoagulation   . ICD (implantable cardiac defibrillator) in place   . Prostate cancer   . Osteoarthritis     RIGHT KNEE  . Aortic stenosis, severe   . MI (myocardial infarction) 1974, 4  . Cervical myelopathy   . Pulmonary embolism   . Hyperlipidemia   . GERD (gastroesophageal reflux disease)   . Esophageal stricture     WITH DILATATION    Past Surgical History  Procedure Date  . Insert / replace / remove pacemaker   . Icd   . Coronary artery bypass graft 1979  . Coronary artery bypass graft 1991    REDO SURGERY  . Cardiac catheterization 2010    SEVERE LV DYSFUNCTION WITH ESTIMATED EJECTION FRACTION OF 25%  . Cholecystectomy     Family History  Problem Relation Age of Onset  . Heart disease Mother 30  . Diabetes Mother 71  . Heart disease Father 59    History   Social History  .  Marital Status: Married    Spouse Name: N/A    Number of Children: N/A  . Years of Education: N/A   Occupational History  . Not on file.   Social History Main Topics  . Smoking status: Former Smoker    Types: Cigarettes    Quit date: 06/15/1969  . Smokeless tobacco: Never Used  . Alcohol Use: No  . Drug Use: No  . Sexually Active: Not on file   Other Topics Concern  . Not on file   Social History Narrative  . No narrative on file    ROS: Please see the HPI.  All other systems reviewed and negative.  PHYSICAL EXAM:  BP 104/64  Pulse 75  Resp 18  Ht 6\' 3"  (1.905 m)  Wt 214 lb (97.07 kg)  BMI 26.75 kg/m2  General: Well developed, well nourished, in no acute distress. Head:  Normocephalic and atraumatic. Neck: no JVD Lungs: Clear to auscultation and percussion. Heart: Normal S1 and S2.  SEM, 1-2/6 with early diastolic blow of 2/6.  No change from last exam.   Abdomen:  Normal bowel sounds; soft; non tender; no organomegaly Pulses: Pulses normal in all 4 extremities. Extremities: No clubbing or cyanosis. No  edema. Neurologic: Alert and oriented x 3.  EKG:  Irregularly irregular rhythm.  FIndings consistent with atrial fibrillation.  Slight delay in R wave consistent with old anterior MI.  Minimal inferior Qs.   ASSESSMENT AND PLAN:

## 2010-10-24 ENCOUNTER — Ambulatory Visit (INDEPENDENT_AMBULATORY_CARE_PROVIDER_SITE_OTHER): Payer: Self-pay | Admitting: Internal Medicine

## 2010-10-24 ENCOUNTER — Ambulatory Visit (HOSPITAL_COMMUNITY): Payer: Medicare Other

## 2010-10-24 ENCOUNTER — Encounter (HOSPITAL_COMMUNITY): Payer: Medicare Other

## 2010-10-24 DIAGNOSIS — R0989 Other specified symptoms and signs involving the circulatory and respiratory systems: Secondary | ICD-10-CM

## 2010-10-26 ENCOUNTER — Ambulatory Visit (HOSPITAL_COMMUNITY): Payer: Medicare Other

## 2010-10-26 ENCOUNTER — Encounter (HOSPITAL_COMMUNITY): Payer: Medicare Other

## 2010-10-26 ENCOUNTER — Encounter: Payer: Self-pay | Admitting: Cardiology

## 2010-10-28 ENCOUNTER — Encounter (HOSPITAL_COMMUNITY): Payer: Medicare Other

## 2010-10-28 ENCOUNTER — Ambulatory Visit (HOSPITAL_COMMUNITY): Payer: Medicare Other

## 2010-10-31 ENCOUNTER — Ambulatory Visit (INDEPENDENT_AMBULATORY_CARE_PROVIDER_SITE_OTHER): Payer: Medicare Other | Admitting: Cardiovascular Disease

## 2010-10-31 ENCOUNTER — Encounter (HOSPITAL_COMMUNITY): Payer: Medicare Other

## 2010-11-02 ENCOUNTER — Encounter (HOSPITAL_COMMUNITY): Payer: Medicare Other

## 2010-11-03 ENCOUNTER — Ambulatory Visit (INDEPENDENT_AMBULATORY_CARE_PROVIDER_SITE_OTHER): Payer: Medicare Other | Admitting: *Deleted

## 2010-11-03 DIAGNOSIS — I428 Other cardiomyopathies: Secondary | ICD-10-CM

## 2010-11-04 ENCOUNTER — Encounter (HOSPITAL_COMMUNITY): Payer: Medicare Other

## 2010-11-07 ENCOUNTER — Ambulatory Visit (INDEPENDENT_AMBULATORY_CARE_PROVIDER_SITE_OTHER): Payer: Self-pay | Admitting: Cardiology

## 2010-11-07 ENCOUNTER — Other Ambulatory Visit: Payer: Self-pay | Admitting: Internal Medicine

## 2010-11-07 DIAGNOSIS — R0989 Other specified symptoms and signs involving the circulatory and respiratory systems: Secondary | ICD-10-CM

## 2010-11-09 ENCOUNTER — Encounter: Payer: Self-pay | Admitting: *Deleted

## 2010-11-15 ENCOUNTER — Encounter (HOSPITAL_COMMUNITY): Payer: Self-pay | Attending: Cardiology

## 2010-11-16 ENCOUNTER — Encounter (HOSPITAL_COMMUNITY): Payer: Self-pay

## 2010-11-17 ENCOUNTER — Encounter (HOSPITAL_COMMUNITY): Payer: Self-pay

## 2010-11-21 ENCOUNTER — Telehealth: Payer: Self-pay | Admitting: Cardiology

## 2010-11-21 ENCOUNTER — Ambulatory Visit (INDEPENDENT_AMBULATORY_CARE_PROVIDER_SITE_OTHER): Payer: Self-pay | Admitting: Cardiology

## 2010-11-21 DIAGNOSIS — R0989 Other specified symptoms and signs involving the circulatory and respiratory systems: Secondary | ICD-10-CM

## 2010-11-21 LAB — POCT INR: INR: 2.2

## 2010-11-21 NOTE — Telephone Encounter (Signed)
[name]  was seen in the [facility] referred by Dr Rudean Haskell.  The cardiologist there wants to increase the Coreg from 12.5 mg to  25 mg  twice a day. Pt. Would like to have Dr. Rosalyn Charters approval. [name] is aware that Dr. Riley Kill will be in the office  later this week.

## 2010-11-21 NOTE — Telephone Encounter (Signed)
Patient went to [facility],  MD there wants to increase the coreg to 25 mg. pls advise

## 2010-11-21 NOTE — Progress Notes (Signed)
ICD checked remotely.

## 2010-11-22 ENCOUNTER — Telehealth: Payer: Self-pay | Admitting: Nurse Practitioner

## 2010-11-22 ENCOUNTER — Encounter (HOSPITAL_COMMUNITY): Payer: Self-pay

## 2010-11-22 MED ORDER — GUAIFENESIN-CODEINE 100-10 MG/5ML PO SYRP: 5.0000 mL | ORAL_SOLUTION | Freq: Two times a day (BID) | ORAL | Status: DC | PRN

## 2010-11-22 MED ORDER — AZITHROMYCIN 250 MG PO TABS: ORAL_TABLET | ORAL | Status: AC

## 2010-11-22 NOTE — Telephone Encounter (Signed)
Pt has a cough and fever since last night.  Taking cough meds and Tylenol.  He would like some medicine called in for this.  He does not have a PCP.  Call his cell [phone].

## 2010-11-22 NOTE — Telephone Encounter (Signed)
Called stating he has had a cough non-productive all night and a slight fever. Has had recent heart surgery. Per Lawson Fiscal will send in Zpak; and Tussionex. Will send to [place].

## 2010-11-22 NOTE — Telephone Encounter (Signed)
Make sure he can take Zpak and if so it is ok to give a zpak as directed. Will need to get a protime on Thursday.

## 2010-11-23 ENCOUNTER — Encounter (HOSPITAL_COMMUNITY): Payer: Self-pay

## 2010-11-23 ENCOUNTER — Other Ambulatory Visit: Payer: Self-pay | Admitting: *Deleted

## 2010-11-23 MED ORDER — CARVEDILOL 12.5 MG PO TABS: 12.5000 mg | ORAL_TABLET | Freq: Two times a day (BID) | ORAL | Status: DC

## 2010-11-24 ENCOUNTER — Encounter (HOSPITAL_COMMUNITY): Payer: Self-pay

## 2010-11-25 ENCOUNTER — Ambulatory Visit (INDEPENDENT_AMBULATORY_CARE_PROVIDER_SITE_OTHER): Payer: Self-pay | Admitting: Cardiovascular Disease

## 2010-11-25 DIAGNOSIS — R0989 Other specified symptoms and signs involving the circulatory and respiratory systems: Secondary | ICD-10-CM

## 2010-11-25 LAB — POCT INR: INR: 2.2

## 2010-11-28 ENCOUNTER — Ambulatory Visit (INDEPENDENT_AMBULATORY_CARE_PROVIDER_SITE_OTHER): Payer: Medicare Other | Admitting: Cardiology

## 2010-11-28 ENCOUNTER — Encounter: Payer: Self-pay | Admitting: Cardiology

## 2010-11-28 ENCOUNTER — Ambulatory Visit (INDEPENDENT_AMBULATORY_CARE_PROVIDER_SITE_OTHER)
Admission: RE | Admit: 2010-11-28 | Discharge: 2010-11-28 | Disposition: A | Discharge status: Home / Self Care | Payer: Medicare Other | Source: Ambulatory Visit | Attending: Cardiology | Admitting: Cardiology

## 2010-11-28 VITALS — BP 124/70 | HR 80 | Ht 75.0 in | Wt 216.0 lb

## 2010-11-28 DIAGNOSIS — I359 Nonrheumatic aortic valve disorder, unspecified: Secondary | ICD-10-CM

## 2010-11-28 DIAGNOSIS — I2510 Atherosclerotic heart disease of native coronary artery without angina pectoris: Secondary | ICD-10-CM

## 2010-11-28 DIAGNOSIS — I4891 Unspecified atrial fibrillation: Secondary | ICD-10-CM

## 2010-11-28 DIAGNOSIS — M20009 Unspecified deformity of unspecified finger(s): Secondary | ICD-10-CM

## 2010-11-28 DIAGNOSIS — Z952 Presence of prosthetic heart valve: Secondary | ICD-10-CM

## 2010-11-28 DIAGNOSIS — R059 Cough, unspecified: Secondary | ICD-10-CM

## 2010-11-28 DIAGNOSIS — R05 Cough: Secondary | ICD-10-CM

## 2010-11-28 DIAGNOSIS — R053 Chronic cough: Secondary | ICD-10-CM

## 2010-11-28 MED ORDER — GUAIFENESIN-CODEINE 100-10 MG/5ML PO SYRP: 5.0000 mL | ORAL_SOLUTION | Freq: Two times a day (BID) | ORAL | Status: AC | PRN

## 2010-11-28 NOTE — Patient Instructions (Signed)
Chest x-ray ordered [facility] office across from [facility]--in the basement)  Your physician recommends that you schedule a follow-up appointment in: 2 MONTHS  Your physician recommends that you continue on your current medications as directed. Please refer to the Current Medication list given to you today.

## 2010-11-28 NOTE — Progress Notes (Addendum)
HPI:  [name] comes in for follow up.  Since I saw him, he has been to the [name], and told that he can stop his plavix.  He also had an echo which shows about the same EF. They increased his carvedilol.  He has had some chronic trouble with cough recently, and is producing sputum that is greenish.  He was started on azithromycin by Jacki Cones, Dr. Ronnald Nian former nurse practitioner, with whom she has a long relationship.  It has been a bit persistent.  He also has noted a growth on his fourth finger.    Current Outpatient Prescriptions  Medication Sig Dispense Refill  . alfuzosin (UROXATRAL) 10 MG 24 hr tablet Take 10 mg by mouth daily.        Marland Kitchen aspirin 81 MG chewable tablet Chew 81 mg by mouth daily.       Marland Kitchen atorvastatin (LIPITOR) 10 MG tablet Take 1 tablet (10 mg total) by mouth daily.  30 tablet  5  . carvedilol (COREG) 12.5 MG tablet Take 1 tablet (12.5 mg total) by mouth 2 (two) times daily.  60 tablet  6  . DOCUSATE SODIUM PO Take by mouth daily.        . FUROSEMIDE PO Take 20 mg by mouth. Take 1 tablet every three days      . guaiFENesin-codeine (ROBITUSSIN AC) 100-10 MG/5ML syrup Take 5 mLs by mouth 2 (two) times daily as needed for cough.  60 mL  0  . levothyroxine (SYNTHROID, LEVOTHROID) 150 MCG tablet Take 150 mcg by mouth daily.        . Multiple Vitamin (MULTIVITAMIN) tablet Take 1 tablet by mouth daily.        Marland Kitchen omeprazole (PRILOSEC OTC) 20 MG tablet Take 20 mg by mouth daily.        . psyllium (METAMUCIL) 58.6 % powder Take 1 packet by mouth as needed.        . warfarin (COUMADIN) 5 MG tablet Take 5 mg by mouth daily. AS DIRECTED        . azithromycin (ZITHROMAX) 250 MG tablet Take 2 tablets (500 mg) on  Day 1,  followed by 1 tablet (250 mg) once daily on Days 2 through 5.  6 each  0  . DISCONTD: carvedilol (COREG) 12.5 MG tablet Take 12.5 mg by mouth 2 (two) times daily with a meal.          Allergies  Allergen Reactions  . Antihistamines, Diphenhydramine-Type   .  Penicillins     Past Medical History  Diagnosis Date  . Ischemic cardiomyopathy 05/2010    WITH PERCUTANEOUS AORTIC VALVE   . Diverticulitis     CURRENTLY CONTROLLED WITH NO EVIDENCE OF RECURRENT INFECTION  . AF (atrial fibrillation)   . Chronic anticoagulation   . ICD (implantable cardiac defibrillator) in place   . Prostate cancer   . Osteoarthritis     RIGHT KNEE  . Aortic stenosis, severe   . MI (myocardial infarction) 1974, 60  . Cervical myelopathy   . Pulmonary embolism   . Hyperlipidemia   . GERD (gastroesophageal reflux disease)   . Esophageal stricture     WITH DILATATION    Past Surgical History  Procedure Date  . Insert / replace / remove pacemaker   . Icd   . Coronary artery bypass graft 1979  . Coronary artery bypass graft 1991    REDO SURGERY  . Cardiac catheterization 2010    SEVERE LV DYSFUNCTION WITH  ESTIMATED EJECTION FRACTION OF 25%  . Cholecystectomy     Family History  Problem Relation Age of Onset  . Heart disease Mother 26  . Diabetes Mother 95  . Heart disease Father 24    History   Social History  . Marital Status: Married    Spouse Name: N/A    Number of Children: N/A  . Years of Education: N/A   Occupational History  . Not on file.   Social History Main Topics  . Smoking status: Former Smoker    Types: Cigarettes    Quit date: 06/15/1969  . Smokeless tobacco: Never Used  . Alcohol Use: No  . Drug Use: No  . Sexually Active: Not on file   Other Topics Concern  . Not on file   Social History Narrative  . No narrative on file    ROS: Please see the HPI.  All other systems reviewed and negative.  PHYSICAL EXAM:  BP 124/70  Pulse 80  Ht 6\' 3"  (1.905 m)  Wt 216 lb (97.977 kg)  BMI 27.00 kg/m2  General: Well developed, well nourished, in no acute distress. Head:  Normocephalic and atraumatic. Neck: no JVD Lungs: Clear to auscultation and percussion. Heart: Normal S1 and S2.SEM  1.-2/6, early DSM, 1-2/6.     Abdomen:  Normal bowel sounds; soft; non tender; no organomegaly Pulses: Pulses normal in all 4 extremities. Extremities: No clubbing or cyanosis. No edema.  Small knot at base of fourth metacarpal, palmar side---not erythematous.  Less than 1 cm  Neurologic: Alert and oriented x 3.  EKG:  Atrial fibrillation.  Controlled ventricular response.  Nonspecific lateral T wave changes.    ASSESSMENT AND PLAN:

## 2010-11-29 ENCOUNTER — Encounter (HOSPITAL_COMMUNITY): Payer: Self-pay

## 2010-11-29 NOTE — Telephone Encounter (Signed)
Dr Riley Kill spoke with pt about echo results and would like to have the pt remain on Coreg 12.5mg  twice a day.

## 2010-11-30 ENCOUNTER — Encounter (HOSPITAL_COMMUNITY): Payer: Self-pay

## 2010-12-01 ENCOUNTER — Encounter (HOSPITAL_COMMUNITY): Payer: Self-pay

## 2010-12-02 ENCOUNTER — Telehealth: Payer: Self-pay | Admitting: Cardiology

## 2010-12-02 ENCOUNTER — Telehealth: Payer: Self-pay

## 2010-12-02 DIAGNOSIS — R05 Cough: Secondary | ICD-10-CM

## 2010-12-02 NOTE — Telephone Encounter (Signed)
Pt returning call from Lauren from yesterday. Please call back.

## 2010-12-02 NOTE — Telephone Encounter (Signed)
I called over to pulmonary earlier and requested and appt for this pt. At this time they do not have any appts for next week.  A message was taken for the triage nurse to contact me about appt.

## 2010-12-02 NOTE — Telephone Encounter (Signed)
Per Dr Riley Kill he would like the pt to see pulmonary for evaluation of productive cough (green sputum).  The pt was recently given a Zpak and has been using cough syrup as needed.

## 2010-12-02 NOTE — Telephone Encounter (Signed)
I spoke with the pt and he said that Dr Riley Kill left a message on his machine last night but his machine got messed up and he could not understand the message.  I will speak with Dr Riley Kill about this patient.

## 2010-12-05 ENCOUNTER — Encounter: Payer: Self-pay | Admitting: Pulmonary Disease

## 2010-12-05 ENCOUNTER — Ambulatory Visit (INDEPENDENT_AMBULATORY_CARE_PROVIDER_SITE_OTHER): Payer: Self-pay | Admitting: Cardiology

## 2010-12-05 ENCOUNTER — Ambulatory Visit (INDEPENDENT_AMBULATORY_CARE_PROVIDER_SITE_OTHER): Payer: Medicare Other | Admitting: Pulmonary Disease

## 2010-12-05 VITALS — BP 110/70 | HR 64 | Temp 97.4°F | Ht 75.0 in | Wt 218.6 lb

## 2010-12-05 DIAGNOSIS — R0989 Other specified symptoms and signs involving the circulatory and respiratory systems: Secondary | ICD-10-CM

## 2010-12-05 DIAGNOSIS — R05 Cough: Secondary | ICD-10-CM | POA: Insufficient documentation

## 2010-12-05 LAB — POCT INR: INR: 2.9

## 2010-12-05 NOTE — Telephone Encounter (Signed)
Spoke with [name] and she states Dr Riley Kill wants this pt seen asap for prod cough not improving with abx. Consult with KC at 11:15 today, advised her to have him arrive 15 min early.

## 2010-12-05 NOTE — Assessment & Plan Note (Signed)
The patient has a chronic cough that sounds more upper airway in origin than lower.  He did have what sounds like a URI which "muddies the water", but now is back to his dry hacking cough that he has had in the past.  He feels that it worsened with an increased dose of Coreg, and he does have mild obstructive disease on his PFTs last year manifested primarily as air trapping.  I could see this worsening shortness of breath, but not necessarily causing a cough.  His history is suggestive of postnasal drip and also the possibility of laryngopharyngeal reflux.  I would like to treat him for these 2 entities first, and I have also reviewed the behavioral therapies which can help with cyclical coughing.  If he continues to have issues, we will consider other options

## 2010-12-05 NOTE — Telephone Encounter (Signed)
Verlon Au from [facility] called and Dr Shelle Iron can see the pt today at 11:15.  I instructed the pt to arrive at 11:00.  The pt agreed with appointment.

## 2010-12-05 NOTE — Progress Notes (Signed)
  Subjective:    Patient ID: [name], male    DOB: 1932/12/09, 77 y.o.   MRN: [id number]  HPI The patient is a 77 year old male who I've been asked to see for chronic cough.  The patient states that he developed this approximately 6 months ago after a valve replacement at the [facility] clinic.  It is dry in nature, and rarely produced mucus.  He feels the cough has gotten worse since his coreg does has been increased, but is unsure if there is a connection.  He did develop a superimposed upper respiratory infection that went into his chest in September of this year, but improved with a Z-Pak.  He brought up white foamy mucus for a period of time, but has now gone back to his dry hacking cough as before.  He does feel a tickle in his throat that can result in his cough.  The patient states that he continues to have postnasal drip as well as rhinorrhea.  He also has reflux symptoms, and is not taking any proton pump inhibitor regularly.  He has had a recent chest x-ray which showed minimal scarring at the bases, and a slight elevation of his right hemidiaphragm.  Otherwise is unremarkable.  He has also had pulmonary function studies last year which we are trying to obtain.  He has an extensive history of tobacco abuse, but has not smoked since 1971.   Review of Systems  Constitutional: Negative for fever and unexpected weight change.  HENT: Positive for rhinorrhea and trouble swallowing. Negative for ear pain, nosebleeds, congestion, sore throat, sneezing, dental problem, postnasal drip and sinus pressure.   Eyes: Negative for redness and itching.  Respiratory: Positive for cough and shortness of breath. Negative for chest tightness and wheezing.   Cardiovascular: Positive for palpitations. Negative for leg swelling.  Gastrointestinal: Negative for nausea and vomiting.  Genitourinary: Negative for dysuria.  Musculoskeletal: Negative for joint swelling.  Skin: Negative for rash.  Neurological:  Negative for headaches.  Hematological: Does not bruise/bleed easily.  Psychiatric/Behavioral: Negative for dysphoric mood. The patient is not nervous/anxious.        Objective:   Physical Exam Constitutional:  Well developed, no acute distress  HENT:  Nares patent without discharge  Oropharynx without exudate, palate and uvula are normal  Eyes:  Perrla, eomi, no scleral icterus  Neck:  No JVD, no TMG  Cardiovascular:  Irregular rhythm, no rubs or gallops.  2/6 murmur        Intact distal pulses  Pulmonary :  Normal breath sounds, no stridor or respiratory distress   No rales, rhonchi, or wheezing  Abdominal:  Soft, nondistended, bowel sounds present.  No tenderness noted.   Musculoskeletal:  No lower extremity edema noted.  Lymph Nodes:  No cervical lymphadenopathy noted  Skin:  No cyanosis noted  Neurologic:  Alert, appropriate, moves all 4 extremities without obvious deficit.         Assessment & Plan:

## 2010-12-05 NOTE — Patient Instructions (Signed)
Will try chlorpheniramine 4mg  over the counter each night at bedtime for next 3 weeks. dexilant 60mg  one each am for next 3 weeks (for acid reflux) Hard candy in mouth during day to help prevent cough and tickle. No menthol or mint Avoid throat clearing during day, do not overuse voice Please call me in 3 weeks with your response to treatment.

## 2010-12-06 ENCOUNTER — Encounter (HOSPITAL_COMMUNITY): Payer: Self-pay

## 2010-12-06 NOTE — Telephone Encounter (Signed)
Pt scheduled to see Dr Timothy Lasso 03/28/11 at 3:00, arrive at 2:30.  They will mail paperwork to the pt and this must be completed prior to coming into the appointment or appt will be cancelled. I am still awaiting an appt for the pt's wife.

## 2010-12-07 ENCOUNTER — Encounter (HOSPITAL_COMMUNITY): Payer: Self-pay

## 2010-12-08 ENCOUNTER — Encounter (HOSPITAL_COMMUNITY): Payer: Self-pay

## 2010-12-08 LAB — CBC
HCT: 45.4
Platelets: 187
RDW: 13.9
WBC: 7

## 2010-12-08 LAB — PROTIME-INR
INR: 2.1 — ABNORMAL HIGH
INR: 2.2 — ABNORMAL HIGH
INR: 2.2 — ABNORMAL HIGH
INR: 2.2 — ABNORMAL HIGH
Prothrombin Time: 24.2 — ABNORMAL HIGH
Prothrombin Time: 25 — ABNORMAL HIGH
Prothrombin Time: 25 — ABNORMAL HIGH

## 2010-12-08 LAB — BASIC METABOLIC PANEL
BUN: 16
Chloride: 100
Creatinine, Ser: 1.24
GFR calc Af Amer: >60
GFR calc non Af Amer: 57 — ABNORMAL LOW
Potassium: 4.3

## 2010-12-08 LAB — COMPREHENSIVE METABOLIC PANEL
ALT: 13
AST: 20
Albumin: 3.8
Alkaline Phosphatase: 67
Chloride: 102
GFR calc Af Amer: >60
Potassium: 4.2
Sodium: 137
Total Bilirubin: 0.9
Total Protein: 6.2

## 2010-12-08 NOTE — Telephone Encounter (Signed)
I made the pt aware of his appt with Dr Timothy Lasso.  The pt's wife has previously seen Dr Evlyn Kanner and she cannot change physicians per office policy.  Dr Rinaldo Cloud office will call the pt's wife to explain policy and schedule an appointment if needed.

## 2010-12-09 ENCOUNTER — Encounter: Payer: Self-pay | Admitting: Cardiology

## 2010-12-12 LAB — BASIC METABOLIC PANEL
Calcium: 8 — ABNORMAL LOW
Chloride: 97
Creatinine, Ser: 1.29
GFR calc Af Amer: >60
Sodium: 127 — ABNORMAL LOW

## 2010-12-12 LAB — URINALYSIS, ROUTINE W REFLEX MICROSCOPIC
Glucose, UA: NEGATIVE
Ketones, ur: NEGATIVE
Nitrite: NEGATIVE
Protein, ur: NEGATIVE
Urobilinogen, UA: 0.2

## 2010-12-12 LAB — CBC
Hemoglobin: 12.3 — ABNORMAL LOW
Hemoglobin: 14.4
MCV: 93.4
RBC: 3.84 — ABNORMAL LOW
RBC: 4.16 — ABNORMAL LOW
RBC: 4.55
WBC: 8.9
WBC: 9.2

## 2010-12-12 LAB — COMPREHENSIVE METABOLIC PANEL
ALT: 23
AST: 26
Alkaline Phosphatase: 60
CO2: 25
Chloride: 102
GFR calc Af Amer: >60
GFR calc non Af Amer: 56 — ABNORMAL LOW
Glucose, Bld: 98
Sodium: 134 — ABNORMAL LOW
Total Bilirubin: 0.8

## 2010-12-12 LAB — B-NATRIURETIC PEPTIDE (CONVERTED LAB): Pro B Natriuretic peptide (BNP): 137 — ABNORMAL HIGH

## 2010-12-12 LAB — DIFFERENTIAL
Basophils Absolute: 0
Basophils Relative: 0
Eosinophils Absolute: 0.1
Eosinophils Relative: 2
Neutrophils Relative %: 88 — ABNORMAL HIGH

## 2010-12-12 LAB — PROTIME-INR
INR: 1.7 — ABNORMAL HIGH
INR: 1.9 — ABNORMAL HIGH
INR: 1.9 — ABNORMAL HIGH
Prothrombin Time: 23.3 — ABNORMAL HIGH

## 2010-12-12 LAB — POCT CARDIAC MARKERS: Troponin i, poc: <0.05

## 2010-12-13 ENCOUNTER — Encounter (HOSPITAL_COMMUNITY): Payer: Medicare Other | Attending: Cardiology

## 2010-12-13 DIAGNOSIS — Z5189 Encounter for other specified aftercare: Secondary | ICD-10-CM | POA: Insufficient documentation

## 2010-12-13 DIAGNOSIS — I4891 Unspecified atrial fibrillation: Secondary | ICD-10-CM | POA: Insufficient documentation

## 2010-12-13 DIAGNOSIS — I472 Ventricular tachycardia, unspecified: Secondary | ICD-10-CM | POA: Insufficient documentation

## 2010-12-13 DIAGNOSIS — I4729 Other ventricular tachycardia: Secondary | ICD-10-CM | POA: Insufficient documentation

## 2010-12-13 DIAGNOSIS — I2589 Other forms of chronic ischemic heart disease: Secondary | ICD-10-CM | POA: Insufficient documentation

## 2010-12-13 DIAGNOSIS — Z95810 Presence of automatic (implantable) cardiac defibrillator: Secondary | ICD-10-CM | POA: Insufficient documentation

## 2010-12-13 DIAGNOSIS — R0989 Other specified symptoms and signs involving the circulatory and respiratory systems: Secondary | ICD-10-CM | POA: Insufficient documentation

## 2010-12-13 DIAGNOSIS — I359 Nonrheumatic aortic valve disorder, unspecified: Secondary | ICD-10-CM | POA: Insufficient documentation

## 2010-12-13 DIAGNOSIS — Z951 Presence of aortocoronary bypass graft: Secondary | ICD-10-CM | POA: Insufficient documentation

## 2010-12-14 ENCOUNTER — Encounter (HOSPITAL_COMMUNITY): Payer: Medicare Other

## 2010-12-15 ENCOUNTER — Encounter (HOSPITAL_COMMUNITY): Payer: Medicare Other

## 2010-12-19 ENCOUNTER — Ambulatory Visit (INDEPENDENT_AMBULATORY_CARE_PROVIDER_SITE_OTHER): Payer: Self-pay | Admitting: Internal Medicine

## 2010-12-19 DIAGNOSIS — M20009 Unspecified deformity of unspecified finger(s): Secondary | ICD-10-CM | POA: Insufficient documentation

## 2010-12-19 DIAGNOSIS — R0989 Other specified symptoms and signs involving the circulatory and respiratory systems: Secondary | ICD-10-CM

## 2010-12-19 LAB — POCT INR: INR: 2.7

## 2010-12-19 NOTE — Assessment & Plan Note (Signed)
Recently seen in [place].  Is in cardiac rehab, and functional status certainly has improved since all of this was done.  Will continue to monitor.  Echo and recommendations from [facility] reviewed.

## 2010-12-19 NOTE — Assessment & Plan Note (Signed)
Rate is well controlled at present.  Continue current meds.  Remains on warfarin.  Now off of clopidogrel.

## 2010-12-19 NOTE — Assessment & Plan Note (Signed)
Not sure the basis of this.  ? Neuroma.  Has seen Dr. Teressa Senter in past.  Will arrange visit.

## 2010-12-19 NOTE — Assessment & Plan Note (Signed)
Slightly worse at present.  Productive of some sputum.  Will refer to pulmonary and get chest xray.  Not sure changing antibiotics as he is just a couple of days after completing azithromycin.  Could in part be related to his increase in carvedilol.  That is desirable, but it is not selective, and could be complicating matters.  Will reassess after pulmonary visit, and get CXR to help with decision making.

## 2010-12-20 ENCOUNTER — Encounter (HOSPITAL_COMMUNITY): Payer: Medicare Other

## 2010-12-21 ENCOUNTER — Ambulatory Visit: Payer: Medicare Other | Admitting: Cardiology

## 2010-12-21 ENCOUNTER — Encounter (HOSPITAL_COMMUNITY): Payer: Medicare Other

## 2010-12-22 ENCOUNTER — Encounter (HOSPITAL_COMMUNITY): Payer: Medicare Other

## 2010-12-27 ENCOUNTER — Encounter (HOSPITAL_COMMUNITY): Payer: Medicare Other

## 2010-12-28 ENCOUNTER — Encounter (HOSPITAL_COMMUNITY): Payer: Medicare Other

## 2010-12-29 ENCOUNTER — Encounter (HOSPITAL_COMMUNITY): Payer: Medicare Other

## 2011-01-02 ENCOUNTER — Ambulatory Visit (INDEPENDENT_AMBULATORY_CARE_PROVIDER_SITE_OTHER): Payer: Self-pay | Admitting: Cardiology

## 2011-01-02 DIAGNOSIS — Z7901 Long term (current) use of anticoagulants: Secondary | ICD-10-CM

## 2011-01-02 DIAGNOSIS — I4891 Unspecified atrial fibrillation: Secondary | ICD-10-CM

## 2011-01-02 DIAGNOSIS — R0989 Other specified symptoms and signs involving the circulatory and respiratory systems: Secondary | ICD-10-CM

## 2011-01-03 ENCOUNTER — Encounter (HOSPITAL_COMMUNITY): Payer: Medicare Other

## 2011-01-04 ENCOUNTER — Encounter (HOSPITAL_COMMUNITY): Payer: Medicare Other

## 2011-01-05 ENCOUNTER — Encounter (HOSPITAL_COMMUNITY): Payer: Medicare Other

## 2011-01-09 ENCOUNTER — Other Ambulatory Visit: Payer: Self-pay | Admitting: Cardiology

## 2011-01-10 ENCOUNTER — Other Ambulatory Visit: Payer: Self-pay | Admitting: Cardiology

## 2011-01-10 ENCOUNTER — Encounter (HOSPITAL_COMMUNITY): Payer: Medicare Other

## 2011-01-11 ENCOUNTER — Encounter (HOSPITAL_COMMUNITY): Payer: Medicare Other

## 2011-01-12 ENCOUNTER — Encounter (HOSPITAL_COMMUNITY): Payer: Self-pay | Attending: Cardiology

## 2011-01-12 DIAGNOSIS — I472 Ventricular tachycardia, unspecified: Secondary | ICD-10-CM | POA: Insufficient documentation

## 2011-01-12 DIAGNOSIS — I2589 Other forms of chronic ischemic heart disease: Secondary | ICD-10-CM | POA: Insufficient documentation

## 2011-01-12 DIAGNOSIS — Z95810 Presence of automatic (implantable) cardiac defibrillator: Secondary | ICD-10-CM | POA: Insufficient documentation

## 2011-01-12 DIAGNOSIS — I4891 Unspecified atrial fibrillation: Secondary | ICD-10-CM | POA: Insufficient documentation

## 2011-01-12 DIAGNOSIS — Z951 Presence of aortocoronary bypass graft: Secondary | ICD-10-CM | POA: Insufficient documentation

## 2011-01-12 DIAGNOSIS — Z5189 Encounter for other specified aftercare: Secondary | ICD-10-CM | POA: Insufficient documentation

## 2011-01-12 DIAGNOSIS — I359 Nonrheumatic aortic valve disorder, unspecified: Secondary | ICD-10-CM | POA: Insufficient documentation

## 2011-01-12 DIAGNOSIS — R0989 Other specified symptoms and signs involving the circulatory and respiratory systems: Secondary | ICD-10-CM | POA: Insufficient documentation

## 2011-01-12 DIAGNOSIS — I4729 Other ventricular tachycardia: Secondary | ICD-10-CM | POA: Insufficient documentation

## 2011-01-16 ENCOUNTER — Ambulatory Visit (INDEPENDENT_AMBULATORY_CARE_PROVIDER_SITE_OTHER): Payer: Self-pay | Admitting: Cardiology

## 2011-01-16 DIAGNOSIS — Z7901 Long term (current) use of anticoagulants: Secondary | ICD-10-CM

## 2011-01-16 DIAGNOSIS — I4891 Unspecified atrial fibrillation: Secondary | ICD-10-CM

## 2011-01-16 DIAGNOSIS — R0989 Other specified symptoms and signs involving the circulatory and respiratory systems: Secondary | ICD-10-CM

## 2011-01-16 LAB — POCT INR: INR: 3.9

## 2011-01-17 ENCOUNTER — Encounter (HOSPITAL_COMMUNITY): Payer: Self-pay

## 2011-01-17 NOTE — Progress Notes (Signed)
[name] reported at [phone] check in for Cardiac rehab exercise that his INR was 3.9 yesterday.  He did report this to the coumadin clinic yesterday. Exercise not permitted today due to INR levels. [name] will check tonight and report to [place] in the morning to determine whether he may attend exercise tomorrow.  Will continue to follow and allow exercise when in therapeutic range.

## 2011-01-18 ENCOUNTER — Encounter (HOSPITAL_COMMUNITY): Payer: Self-pay

## 2011-01-19 ENCOUNTER — Encounter (HOSPITAL_COMMUNITY): Payer: Self-pay

## 2011-01-20 ENCOUNTER — Ambulatory Visit (INDEPENDENT_AMBULATORY_CARE_PROVIDER_SITE_OTHER): Payer: Medicare Other | Admitting: *Deleted

## 2011-01-20 ENCOUNTER — Encounter: Payer: Self-pay | Admitting: Internal Medicine

## 2011-01-20 ENCOUNTER — Ambulatory Visit (INDEPENDENT_AMBULATORY_CARE_PROVIDER_SITE_OTHER): Payer: Medicare Other | Admitting: Internal Medicine

## 2011-01-20 DIAGNOSIS — I255 Ischemic cardiomyopathy: Secondary | ICD-10-CM

## 2011-01-20 DIAGNOSIS — Z95810 Presence of automatic (implantable) cardiac defibrillator: Secondary | ICD-10-CM

## 2011-01-20 DIAGNOSIS — I472 Ventricular tachycardia: Secondary | ICD-10-CM

## 2011-01-20 DIAGNOSIS — I428 Other cardiomyopathies: Secondary | ICD-10-CM

## 2011-01-20 DIAGNOSIS — I4891 Unspecified atrial fibrillation: Secondary | ICD-10-CM

## 2011-01-20 DIAGNOSIS — Z7901 Long term (current) use of anticoagulants: Secondary | ICD-10-CM

## 2011-01-20 DIAGNOSIS — I2589 Other forms of chronic ischemic heart disease: Secondary | ICD-10-CM

## 2011-01-20 LAB — POCT INR: INR: 1.5

## 2011-01-20 NOTE — Patient Instructions (Signed)
Remote monitoring is used to monitor your Pacemaker of ICD from home. This monitoring reduces the number of office visits required to check your device to one time per year. It allows [place] to keep an eye on the functioning of your device to ensure it is working properly. You are scheduled for a device check from home on 04/20/11. You may send your transmission at any time that day. If you have a wireless device, the transmission will be sent automatically. After your physician reviews your transmission, you will receive a postcard with your next transmission date.  Your physician wants you to follow-up in: 1 year with Dr. Graciela Husbands. You will receive a reminder letter in the mail two months in advance. If you don't receive a letter, please call our office to schedule the follow-up appointment.  Your physician recommends that you continue on your current medications as directed. Please refer to the Current Medication list given to you today.

## 2011-01-20 NOTE — Assessment & Plan Note (Signed)
The patient's device was interrogated.  The information was reviewed. No changes were made in the programming.    

## 2011-01-20 NOTE — Assessment & Plan Note (Signed)
As above.

## 2011-01-20 NOTE — Progress Notes (Signed)
  HPI  [name] is a 77 y.o. male seen in followup for ischemic cardiomyopathy for which he underwent ICD implantation for primary prevention. Hehas had appropriate therapy for ventricular tachycardia.  He is status post bypass surgery.   He recently underwent percutaneous aortic valve replacement. And has done marvelously  He also has a history of atrial fibrillation for which amiodarone was initiated. He was intolerant of this and this has been discontinued.      Past Medical History  Diagnosis Date  . Ischemic cardiomyopathy 05/2010    WITH PERCUTANEOUS AORTIC VALVE   . Diverticulitis     CURRENTLY CONTROLLED WITH NO EVIDENCE OF RECURRENT INFECTION  . AF (atrial fibrillation)   . Chronic anticoagulation   . ICD (implantable cardiac defibrillator) in place   . Prostate cancer   . Osteoarthritis     RIGHT KNEE  . Aortic stenosis, severe   . MI (myocardial infarction) 1974, 7  . Cervical myelopathy   . Pulmonary embolism   . Hyperlipidemia   . GERD (gastroesophageal reflux disease)   . Esophageal stricture     WITH DILATATION    Past Surgical History  Procedure Date  . Insert / replace / remove pacemaker   . Icd   . Coronary artery bypass graft 1979  . Coronary artery bypass graft 1991    REDO SURGERY  . Cardiac catheterization 2010    SEVERE LV DYSFUNCTION WITH ESTIMATED EJECTION FRACTION OF 25%  . Cholecystectomy     Current Outpatient Prescriptions  Medication Sig Dispense Refill  . alfuzosin (UROXATRAL) 10 MG 24 hr tablet Take 10 mg by mouth daily.        Marland Kitchen aspirin 81 MG chewable tablet Chew 81 mg by mouth daily.       Marland Kitchen atorvastatin (LIPITOR) 10 MG tablet Take 1 tablet (10 mg total) by mouth daily.  30 tablet  5  . carvedilol (COREG) 12.5 MG tablet Take 1 tablet (12.5 mg total) by mouth 2 (two) times daily.  60 tablet  6  . FUROSEMIDE PO Take 20 mg by mouth. Take 1 tablet every three days      . levothyroxine (SYNTHROID, LEVOTHROID) 150 MCG tablet Take  150 mcg by mouth daily.        . Multiple Vitamin (MULTIVITAMIN) tablet Take 1 tablet by mouth daily.        Marland Kitchen omeprazole (PRILOSEC OTC) 20 MG tablet Take 20 mg by mouth daily.        . psyllium (METAMUCIL) 58.6 % powder Take 1 packet by mouth as needed.        . warfarin (COUMADIN) 5 MG tablet Take 5 mg by mouth daily. AS DIRECTED          Allergies  Allergen Reactions  . Antihistamines, Diphenhydramine-Type   . Penicillins     Review of Systems negative except from HPI and PMH  Physical Exam Well developed and well nourished in no acute distress HENT normal E scleral and icterus clear Neck Supple JVP flat; carotids brisk and full Clear to ausculation Regular rate and rhythm, There is an early systolic murmur as well as a diastolic rumble Soft with active bowel sounds No clubbing cyanosis and edema Alert and oriented, grossly normal motor and sensory function Skin Warm and Dry     Assessment and  Plan

## 2011-01-20 NOTE — Assessment & Plan Note (Signed)
No recurrent atrial fibrillation; this however begs the issue of anticoagulation. In the absence of his TVAR I would recommend discontinuation of his aspirin. However, this will need to be a decision made at the [facility] clinic.

## 2011-01-20 NOTE — Assessment & Plan Note (Signed)
No recurrent ventricular tachycardia 

## 2011-01-20 NOTE — Assessment & Plan Note (Signed)
Stable on current medications 

## 2011-01-23 ENCOUNTER — Ambulatory Visit (INDEPENDENT_AMBULATORY_CARE_PROVIDER_SITE_OTHER): Payer: Self-pay | Admitting: Cardiology

## 2011-01-23 DIAGNOSIS — R0989 Other specified symptoms and signs involving the circulatory and respiratory systems: Secondary | ICD-10-CM

## 2011-01-23 DIAGNOSIS — I4891 Unspecified atrial fibrillation: Secondary | ICD-10-CM

## 2011-01-23 DIAGNOSIS — Z7901 Long term (current) use of anticoagulants: Secondary | ICD-10-CM

## 2011-01-24 ENCOUNTER — Encounter (HOSPITAL_COMMUNITY): Payer: Self-pay

## 2011-01-25 ENCOUNTER — Encounter (HOSPITAL_COMMUNITY): Payer: Self-pay

## 2011-01-26 ENCOUNTER — Encounter (HOSPITAL_COMMUNITY): Payer: Self-pay

## 2011-01-30 ENCOUNTER — Ambulatory Visit (INDEPENDENT_AMBULATORY_CARE_PROVIDER_SITE_OTHER): Payer: Self-pay | Admitting: Cardiology

## 2011-01-30 DIAGNOSIS — R0989 Other specified symptoms and signs involving the circulatory and respiratory systems: Secondary | ICD-10-CM

## 2011-01-30 DIAGNOSIS — I4891 Unspecified atrial fibrillation: Secondary | ICD-10-CM

## 2011-01-30 DIAGNOSIS — Z7901 Long term (current) use of anticoagulants: Secondary | ICD-10-CM

## 2011-01-31 ENCOUNTER — Encounter: Payer: Self-pay | Admitting: Nurse Practitioner

## 2011-01-31 ENCOUNTER — Telehealth: Payer: Self-pay | Admitting: Cardiology

## 2011-01-31 ENCOUNTER — Encounter (HOSPITAL_COMMUNITY): Payer: Self-pay | Admitting: Pharmacy Technician

## 2011-01-31 ENCOUNTER — Encounter (HOSPITAL_COMMUNITY): Payer: Self-pay

## 2011-01-31 ENCOUNTER — Ambulatory Visit (INDEPENDENT_AMBULATORY_CARE_PROVIDER_SITE_OTHER): Payer: Medicare Other | Admitting: Nurse Practitioner

## 2011-01-31 VITALS — BP 118/70 | HR 70 | Ht 75.0 in | Wt 216.4 lb

## 2011-01-31 DIAGNOSIS — R053 Chronic cough: Secondary | ICD-10-CM

## 2011-01-31 DIAGNOSIS — I4891 Unspecified atrial fibrillation: Secondary | ICD-10-CM

## 2011-01-31 DIAGNOSIS — I2589 Other forms of chronic ischemic heart disease: Secondary | ICD-10-CM

## 2011-01-31 DIAGNOSIS — Z7901 Long term (current) use of anticoagulants: Secondary | ICD-10-CM

## 2011-01-31 DIAGNOSIS — R05 Cough: Secondary | ICD-10-CM

## 2011-01-31 DIAGNOSIS — I255 Ischemic cardiomyopathy: Secondary | ICD-10-CM

## 2011-01-31 DIAGNOSIS — R059 Cough, unspecified: Secondary | ICD-10-CM

## 2011-01-31 MED ORDER — DIGOXIN 125 MCG PO TABS: 125.0000 ug | ORAL_TABLET | Freq: Every day | ORAL | Status: DC

## 2011-01-31 NOTE — Assessment & Plan Note (Signed)
He is back in atrial fib. Rate is controlled. He is subsequently seen with Dr. Graciela Husbands and Dr. Riley Kill. We will add Lanoxin 0.125 mg to his regimen. We have a tentative spot for cardioversion next week. I will see him back on Monday to reassess. He has been on his coumadin right along. He may ultimately require Tikosyn if continues to have recurrence. Patient is agreeable to this plan and will call if any problems develop in the interim.

## 2011-01-31 NOTE — Progress Notes (Signed)
[name] Date of Birth: 1932-06-01 Medical Record #[id number]  History of Present Illness: [name] is seen today for a work in visit. He is seen for Dr. Riley Kill and Dr. Graciela Husbands. He has not been feeling well for the last several days. He thinks he is back in atrial fib. He noted some heart racing earlier this am. He felt shaky and weak. He has had these symptoms for about a month. His device was checked earlier this month and was ok. No shocks. His bronchitis has resolved. He has seen pulmonary and started on some antihistamine. He has had issues with voiding problems in the past with antihistamines. No chest pain.   Current Outpatient Prescriptions on File Prior to Visit  Medication Sig Dispense Refill  . alfuzosin (UROXATRAL) 10 MG 24 hr tablet Take 10 mg by mouth daily.        Marland Kitchen aspirin 81 MG chewable tablet Chew 81 mg by mouth daily.       Marland Kitchen atorvastatin (LIPITOR) 10 MG tablet Take 1 tablet (10 mg total) by mouth daily.  30 tablet  5  . carvedilol (COREG) 12.5 MG tablet Take 1 tablet (12.5 mg total) by mouth 2 (two) times daily.  60 tablet  6  . FUROSEMIDE PO Take 20 mg by mouth. Take 1 tablet every three days      . levothyroxine (SYNTHROID, LEVOTHROID) 150 MCG tablet Take 150 mcg by mouth daily.        . Multiple Vitamin (MULTIVITAMIN) tablet Take 1 tablet by mouth daily.        Marland Kitchen omeprazole (PRILOSEC OTC) 20 MG tablet Take 20 mg by mouth as needed.       . psyllium (METAMUCIL) 58.6 % powder Take 1 packet by mouth as needed.        . warfarin (COUMADIN) 5 MG tablet Take 5 mg by mouth daily. AS DIRECTED          Allergies  Allergen Reactions  . Antihistamines, Diphenhydramine-Type   . Penicillins     Past Medical History  Diagnosis Date  . Ischemic cardiomyopathy 05/2010    Has EF of 25%  . Diverticulitis     CURRENTLY CONTROLLED WITH NO EVIDENCE OF RECURRENT INFECTION  . AF (atrial fibrillation)     Has not tolerated amiodarone in the past. Amiodarone was stopped in September  of 2010 due to side effects  . Chronic anticoagulation     on coumadin  . ICD (implantable cardiac defibrillator) in place   . Prostate cancer   . Osteoarthritis     RIGHT KNEE  . Aortic stenosis, severe     WITH PERCUTANEOUS AORTIC VALVE IN March 2012  . MI (myocardial infarction) 1974, 25  . Cervical myelopathy   . Pulmonary embolism   . Hyperlipidemia   . GERD (gastroesophageal reflux disease)   . Esophageal stricture     WITH DILATATION  . NSVT (nonsustained ventricular tachycardia)     Past Surgical History  Procedure Date  . Icd Feb 2003  . Coronary artery bypass graft 1979  . Coronary artery bypass graft 1991    REDO SURGERY  . Cardiac catheterization 2010    SEVERE LV DYSFUNCTION WITH ESTIMATED EJECTION FRACTION OF 25%  . Cholecystectomy   . Aortic valve replacement     Percutaneous [facility]R in March 2012 at the [facility]    History  Smoking status  . Former Smoker -- 4.0 packs/day for 15 years  . Types: Cigarettes  .  Quit date: 06/15/1969  Smokeless tobacco  . Never Used    History  Alcohol Use No    Family History  Problem Relation Age of Onset  . Heart disease Mother 15  . Diabetes Mother 7  . Heart disease Father 60    Review of Systems: The review of systems is per the HPI.  All other systems were reviewed and are negative.  Physical Exam: BP 118/70  Pulse 70  Ht 6\' 3"  (1.905 m)  Wt 216 lb 6.4 oz (98.158 kg)  BMI 27.05 kg/m2 Patient is very pleasant and in no acute distress. Skin is warm and dry. Color is normal.  HEENT is unremarkable. Normocephalic/atraumatic. PERRL. Sclera are nonicteric. Neck is supple. No masses. No JVD. Lungs are clear. Cardiac exam shows an irregular rhythm. He has a soft outflow murmur noted. Abdomen is soft. Extremities are without edema. Gait and ROM are intact. No gross neurologic deficits noted.  LABORATORY DATA: EKG with atrial fibrillation. Diffuse T wave changes. Tracing has been reviewed by both Dr.  Graciela Husbands and Dr. Riley Kill.   Assessment / Plan:

## 2011-01-31 NOTE — Assessment & Plan Note (Signed)
This has gotten better.

## 2011-01-31 NOTE — Assessment & Plan Note (Signed)
He has been on his coumadin and therapeutic. We will recheck on Monday.

## 2011-01-31 NOTE — Telephone Encounter (Signed)
New Msg: Pt calling c/o of real weak feeling and wanting to discuss it with nurse. Please return pt call to discuss further.

## 2011-01-31 NOTE — Telephone Encounter (Signed)
I spoke with the [name] and he woke up this morning around 4 AM and felt "very weak inside".  The [name] states that this is how he felt when he went into Atrial Fibrillation.  The [name]'s BP was 126/75 and pulse 75.  The [name] denies CP and SOB.  The [name] has had a few episodes of dizziness over the last week.  I have scheduled the [name] to see Norma Fredrickson NP today for further evaluation.  The [name]'s wife will bring him into the office. The [name] does have a scheduled follow-up appt with Dr Riley Kill on 02/06/11.

## 2011-01-31 NOTE — Patient Instructions (Addendum)
We will arrange for a possible cardioversion next week.  Stay on your current medicines. We are going to add Lanoxin 0.125 mg each day. Start this today.   We will need to check your labs and your coumadin level on Monday here at the office. I will see you on Monday as well to recheck your rhythm.   You are scheduled for a cardioversion on November 30th, Friday at 2:00pm with Dr. Myrtis Ser or associates. Please go to [facility] 2nd Floor Short Stay at 12 noon.  Do not have any food or drink after midnight on next Thursday.  You may take your medicines with a sip of water on the day of your procedure.  You will need someone to drive you home following your procedure.

## 2011-01-31 NOTE — Assessment & Plan Note (Signed)
Apparently his EF is unchanged. He does not appear decompensated today with his atrial fib. He is being titrated on Coreg. I have left him on his current dose for now.

## 2011-02-01 ENCOUNTER — Encounter (HOSPITAL_COMMUNITY): Payer: Self-pay | Admitting: Pharmacy Technician

## 2011-02-01 ENCOUNTER — Encounter (HOSPITAL_COMMUNITY): Admission: RE | Admit: 2011-02-01 | Payer: Self-pay | Source: Ambulatory Visit

## 2011-02-02 ENCOUNTER — Encounter (HOSPITAL_COMMUNITY): Payer: Self-pay

## 2011-02-03 ENCOUNTER — Encounter (HOSPITAL_COMMUNITY): Payer: Self-pay

## 2011-02-06 ENCOUNTER — Encounter: Payer: Self-pay | Admitting: Nurse Practitioner

## 2011-02-06 ENCOUNTER — Ambulatory Visit: Payer: Medicare Other | Admitting: Cardiology

## 2011-02-06 ENCOUNTER — Ambulatory Visit (INDEPENDENT_AMBULATORY_CARE_PROVIDER_SITE_OTHER): Payer: Medicare Other | Admitting: Nurse Practitioner

## 2011-02-06 ENCOUNTER — Ambulatory Visit (INDEPENDENT_AMBULATORY_CARE_PROVIDER_SITE_OTHER): Payer: Medicare Other | Admitting: *Deleted

## 2011-02-06 VITALS — BP 132/78 | HR 50 | Ht 75.0 in | Wt 221.0 lb

## 2011-02-06 DIAGNOSIS — Z7901 Long term (current) use of anticoagulants: Secondary | ICD-10-CM

## 2011-02-06 DIAGNOSIS — I4891 Unspecified atrial fibrillation: Secondary | ICD-10-CM

## 2011-02-06 DIAGNOSIS — I2589 Other forms of chronic ischemic heart disease: Secondary | ICD-10-CM

## 2011-02-06 DIAGNOSIS — I255 Ischemic cardiomyopathy: Secondary | ICD-10-CM

## 2011-02-06 LAB — CBC WITH DIFFERENTIAL/PLATELET
Basophils Absolute: 0 10*3/uL (ref 0.0–0.1)
Basophils Relative: 0.5 % (ref 0.0–3.0)
Eosinophils Absolute: 0.3 10*3/uL (ref 0.0–0.7)
Eosinophils Relative: 4.3 % (ref 0.0–5.0)
HCT: 42 % (ref 39.0–52.0)
Hemoglobin: 13.8 g/dL (ref 13.0–17.0)
Lymphocytes Relative: 16.1 % (ref 12.0–46.0)
Lymphs Abs: 1.1 10*3/uL (ref 0.7–4.0)
MCHC: 32.7 g/dL (ref 30.0–36.0)
MCV: 94.4 fl (ref 78.0–100.0)
Monocytes Absolute: 0.6 10*3/uL (ref 0.1–1.0)
Monocytes Relative: 9.1 % (ref 3.0–12.0)
Neutro Abs: 4.8 10*3/uL (ref 1.4–7.7)
Neutrophils Relative %: 70 % (ref 43.0–77.0)
Platelets: 155 10*3/uL (ref 150.0–400.0)
RBC: 4.45 Mil/uL (ref 4.22–5.81)
RDW: 15 % — ABNORMAL HIGH (ref 11.5–14.6)
WBC: 6.8 10*3/uL (ref 4.5–10.5)

## 2011-02-06 LAB — POCT INR: INR: 2.5

## 2011-02-06 LAB — BASIC METABOLIC PANEL
BUN: 13 mg/dL (ref 6–23)
CO2: 29 mEq/L (ref 19–32)
Calcium: 8.9 mg/dL (ref 8.4–10.5)
Chloride: 104 mEq/L (ref 96–112)
Creatinine, Ser: 1.2 mg/dL (ref 0.4–1.5)
GFR: 65.57 mL/min (ref >60.00)
Glucose, Bld: 89 mg/dL (ref 70–99)
Potassium: 5 mEq/L (ref 3.5–5.1)
Sodium: 140 mEq/L (ref 135–145)

## 2011-02-06 LAB — APTT: aPTT: 37.2 s — ABNORMAL HIGH (ref 21.7–28.8)

## 2011-02-06 NOTE — Assessment & Plan Note (Signed)
He has been on his coumadin right along. He is therapeutic today. Review of his coumadin flowsheet shows he had an INR of 1.5 on the 9th of November. I will discuss with Dr. Graciela Husbands on Tuesday if he wants to proceed on with cardioversion Friday.

## 2011-02-06 NOTE — Assessment & Plan Note (Addendum)
He remains in atrial fib. He does feel better clinically since Digoxin was added. The option of Tikosyn is available per prior discussion with Dr. Graciela Husbands. We have him tentatively scheduled for cardioversion on Friday. That procedure is reviewed with him and he is willing to proceed. Patient is agreeable to this plan and will call if any problems develop in the interim.   02/07/11 Dr. Graciela Husbands is out of the office due to an emergency. I have spoken with Dr. Riley Kill yesterday evening. He would like to postpone the cardioversion for another week. Jeffrey will check his coumadin at home next Monday, December the 3rd. His cardioversion is scheduled for Thursday December the 6th at 2:00 pm. Instructions are reviewed with [name] over the phone. He is agreeable to this plan.

## 2011-02-06 NOTE — Progress Notes (Signed)
Alba Destine. Date of Birth: 01-24-1933 Medical Record #[id number]  History of Present Illness: Elgin is seen back today for a follow up visit. He is seen for Dr. Riley Kill. He has multiple medical issues which include an ischemic cardiomyopathy, recent TAVI in [place] and recurrent atrial fib. He did not tolerate amiodarone in the past.  EF remains low at 25%. He has been on long term coumadin anticoagulation.  I saw him last week with complaints of feeling a "quivering" sensation with a fast heart beat. EKG showed recurrent atrial fib. Dr. Graciela Husbands saw and evaluated him as well. We started Digoxin at a low dose. He comes back in today for follow up. He has not converted back to sinus rhythm. But he is feeling better. No further "quivering" sensation. No chest pain. No ICD shocks. He is currently not attending cardiac rehab until we get this issue addressed.   Current Outpatient Prescriptions on File Prior to Visit  Medication Sig Dispense Refill  . alfuzosin (UROXATRAL) 10 MG 24 hr tablet Take 10 mg by mouth daily.        Marland Kitchen aspirin 81 MG chewable tablet Chew 81 mg by mouth daily.       Marland Kitchen atorvastatin (LIPITOR) 10 MG tablet Take 1 tablet (10 mg total) by mouth daily.  30 tablet  5  . carvedilol (COREG) 12.5 MG tablet Take 1 tablet (12.5 mg total) by mouth 2 (two) times daily.  60 tablet  6  . digoxin (LANOXIN) 0.125 MG tablet Take 1 tablet (125 mcg total) by mouth daily.  30 tablet  11  . furosemide (LASIX) 20 MG tablet Take 20 mg by mouth every 3 (three) days.        Marland Kitchen levothyroxine (SYNTHROID, LEVOTHROID) 150 MCG tablet Take 150 mcg by mouth daily.        . Multiple Vitamin (MULTIVITAMIN) tablet Take 1 tablet by mouth daily.        Marland Kitchen omeprazole (PRILOSEC OTC) 20 MG tablet Take 20 mg by mouth as needed. For acid reflux      . psyllium (METAMUCIL) 58.6 % powder Take 1 packet by mouth as needed. For constipation      . warfarin (COUMADIN) 5 MG tablet Take 2.5-5 mg by mouth daily. Monday only-  0.5 half tab (2.5mg ); All other days 1 tab (5mg )         Allergies  Allergen Reactions  . Antihistamines, Diphenhydramine-Type Other (See Comments)    unknown  . Penicillins Other (See Comments)    Couldn't breathe.    Past Medical History  Diagnosis Date  . Ischemic cardiomyopathy 05/2010    Has EF of 25%  . Diverticulitis     CURRENTLY CONTROLLED WITH NO EVIDENCE OF RECURRENT INFECTION  . AF (atrial fibrillation)     Has not tolerated amiodarone in the past. Amiodarone was stopped in September of 2010 due to side effects  . Chronic anticoagulation     on coumadin  . ICD (implantable cardiac defibrillator) in place   . Prostate cancer   . Osteoarthritis     RIGHT KNEE  . Aortic stenosis, severe     WITH PERCUTANEOUS AORTIC VALVE (TAVI) IN March 2012 at the [facility]  . MI (myocardial infarction) 1974, 44  . Cervical myelopathy   . Pulmonary embolism   . Hyperlipidemia   . GERD (gastroesophageal reflux disease)   . Esophageal stricture     WITH DILATATION  . NSVT (nonsustained ventricular tachycardia)  Past Surgical History  Procedure Date  . Icd Feb 2003  . Coronary artery bypass graft 1979  . Coronary artery bypass graft 1991    REDO SURGERY  . Cardiac catheterization 2010    SEVERE LV DYSFUNCTION WITH ESTIMATED EJECTION FRACTION OF 25%  . Cholecystectomy   . Aortic valve replacement     Percutaneous AVR in March 2012 at the [facility]    History  Smoking status  . Former Smoker -- 4.0 packs/day for 15 years  . Types: Cigarettes  . Quit date: 06/15/1969  Smokeless tobacco  . Never Used    History  Alcohol Use No    Family History  Problem Relation Age of Onset  . Heart disease Mother 23  . Diabetes Mother 38  . Heart disease Father 28    Review of Systems: The review of systems is positive for some upper arm soreness intermittently. No cardiac complaints. Weights have been stable at home. No swelling. No PND or orthopnea.  All  other systems were reviewed and are negative.  Physical Exam: BP 132/78  Pulse 50  Ht 6\' 3"  (1.905 m)  Wt 221 lb (100.245 kg)  BMI 27.62 kg/m2 Patient is very pleasant and in no acute distress. Skin is warm and dry. Color is normal.  HEENT is unremarkable. Normocephalic/atraumatic. PERRL. Sclera are nonicteric. Neck is supple. No masses. No JVD. Lungs are clear. Cardiac exam shows an irregular rhythm. Rate is controlled. He has an outflow murmur. Abdomen is soft. Extremities are without edema. Gait and ROM are intact. No gross neurologic deficits noted.  LABORATORY DATA: EKG today continues to show atrial fib. Rate is slower at 51.   Assessment / Plan:

## 2011-02-06 NOTE — Patient Instructions (Signed)
You are still out of rhythm. We are going to proceed on with the cardioversion this Friday.  You are scheduled for a cardioversion on Friday, November 30th at 2:00 pm with Dr. Myrtis Ser or associates. Please go to [facility] 2nd Floor Short Stay at 12 noon.  Do not have any food or drink after midnight on Thursday.  You may take your medicines with a sip of water on the day of your procedure.  You will need someone to drive you home following your procedure.

## 2011-02-06 NOTE — Assessment & Plan Note (Signed)
EF has remained low. He looks compensated today with his atrial fib. He will continue to weigh at home. I have left him on the 12.5 mg of Coreg. Heart rate is now down in the 50's. I am not sure if he has an atrial lead. I have left him on his current dose of Coreg for now. This can be readdressed in the future.

## 2011-02-07 ENCOUNTER — Other Ambulatory Visit: Payer: Self-pay | Admitting: Nurse Practitioner

## 2011-02-07 ENCOUNTER — Encounter (HOSPITAL_COMMUNITY): Payer: Self-pay

## 2011-02-07 DIAGNOSIS — I4891 Unspecified atrial fibrillation: Secondary | ICD-10-CM

## 2011-02-08 ENCOUNTER — Encounter (HOSPITAL_COMMUNITY): Payer: Self-pay

## 2011-02-09 ENCOUNTER — Encounter (HOSPITAL_COMMUNITY): Payer: Self-pay

## 2011-02-10 ENCOUNTER — Telehealth: Payer: Self-pay | Admitting: Nurse Practitioner

## 2011-02-10 NOTE — Telephone Encounter (Signed)
New problem Pt is having swelling when taking digoxin. Please call

## 2011-02-10 NOTE — Telephone Encounter (Signed)
I spoke with the pt and he has developed some swelling in his ankles over the last two days.  The pt normally does not have any problems with swelling.  The pt does weigh himself and he has gained 2 pounds over the last two days also. The pt does not have SOB.  Today the pt took his Furosemide 20mg  and he has been using the restroom frequently.  I made the pt aware that he should continue to watch his salt intake and elevate his feet when able.  The pt has an EF of 30-35% per June Echo. The pt was scheduled for a DCCV today but this was rescheduled to 02/16/11 due to low INR. I will make Dr Riley Kill aware of this information.

## 2011-02-10 NOTE — Telephone Encounter (Signed)
I spoke with Dr Riley Kill and he did not recommend any other changes at this time.

## 2011-02-13 ENCOUNTER — Ambulatory Visit (INDEPENDENT_AMBULATORY_CARE_PROVIDER_SITE_OTHER): Payer: Self-pay | Admitting: Internal Medicine

## 2011-02-13 DIAGNOSIS — I4891 Unspecified atrial fibrillation: Secondary | ICD-10-CM

## 2011-02-13 DIAGNOSIS — R0989 Other specified symptoms and signs involving the circulatory and respiratory systems: Secondary | ICD-10-CM

## 2011-02-13 DIAGNOSIS — Z7901 Long term (current) use of anticoagulants: Secondary | ICD-10-CM

## 2011-02-13 LAB — POCT INR: INR: 2.2

## 2011-02-14 ENCOUNTER — Encounter: Payer: Medicare Other | Admitting: *Deleted

## 2011-02-14 ENCOUNTER — Encounter (HOSPITAL_COMMUNITY): Payer: Medicare Other

## 2011-02-14 DIAGNOSIS — R0989 Other specified symptoms and signs involving the circulatory and respiratory systems: Secondary | ICD-10-CM | POA: Insufficient documentation

## 2011-02-14 DIAGNOSIS — I4891 Unspecified atrial fibrillation: Secondary | ICD-10-CM | POA: Insufficient documentation

## 2011-02-14 DIAGNOSIS — Z95810 Presence of automatic (implantable) cardiac defibrillator: Secondary | ICD-10-CM | POA: Insufficient documentation

## 2011-02-14 DIAGNOSIS — Z951 Presence of aortocoronary bypass graft: Secondary | ICD-10-CM | POA: Insufficient documentation

## 2011-02-14 DIAGNOSIS — Z5189 Encounter for other specified aftercare: Secondary | ICD-10-CM | POA: Insufficient documentation

## 2011-02-14 DIAGNOSIS — I4729 Other ventricular tachycardia: Secondary | ICD-10-CM | POA: Insufficient documentation

## 2011-02-14 DIAGNOSIS — I472 Ventricular tachycardia, unspecified: Secondary | ICD-10-CM | POA: Insufficient documentation

## 2011-02-14 DIAGNOSIS — I359 Nonrheumatic aortic valve disorder, unspecified: Secondary | ICD-10-CM | POA: Insufficient documentation

## 2011-02-14 DIAGNOSIS — I2589 Other forms of chronic ischemic heart disease: Secondary | ICD-10-CM | POA: Insufficient documentation

## 2011-02-15 ENCOUNTER — Encounter (HOSPITAL_COMMUNITY): Payer: Medicare Other

## 2011-02-16 ENCOUNTER — Ambulatory Visit (INDEPENDENT_AMBULATORY_CARE_PROVIDER_SITE_OTHER): Payer: Self-pay | Admitting: Cardiology

## 2011-02-16 ENCOUNTER — Encounter (HOSPITAL_COMMUNITY): Payer: Self-pay | Admitting: Anesthesiology

## 2011-02-16 ENCOUNTER — Ambulatory Visit (HOSPITAL_COMMUNITY)
Admission: RE | Admit: 2011-02-16 | Discharge: 2011-02-16 | Disposition: A | Discharge status: Home / Self Care | Payer: Medicare Other | Source: Ambulatory Visit | Attending: Cardiology | Admitting: Cardiology

## 2011-02-16 ENCOUNTER — Encounter (HOSPITAL_COMMUNITY): Payer: Medicare Other

## 2011-02-16 ENCOUNTER — Encounter (HOSPITAL_COMMUNITY)
Admission: RE | Disposition: A | Discharge status: Home / Self Care | Payer: Self-pay | Source: Ambulatory Visit | Attending: Cardiology

## 2011-02-16 ENCOUNTER — Other Ambulatory Visit: Payer: Self-pay

## 2011-02-16 DIAGNOSIS — Z5309 Procedure and treatment not carried out because of other contraindication: Secondary | ICD-10-CM | POA: Insufficient documentation

## 2011-02-16 DIAGNOSIS — I4891 Unspecified atrial fibrillation: Secondary | ICD-10-CM

## 2011-02-16 DIAGNOSIS — Z95810 Presence of automatic (implantable) cardiac defibrillator: Secondary | ICD-10-CM | POA: Insufficient documentation

## 2011-02-16 DIAGNOSIS — I252 Old myocardial infarction: Secondary | ICD-10-CM | POA: Insufficient documentation

## 2011-02-16 DIAGNOSIS — Z7901 Long term (current) use of anticoagulants: Secondary | ICD-10-CM

## 2011-02-16 DIAGNOSIS — R791 Abnormal coagulation profile: Secondary | ICD-10-CM | POA: Insufficient documentation

## 2011-02-16 DIAGNOSIS — I509 Heart failure, unspecified: Secondary | ICD-10-CM | POA: Insufficient documentation

## 2011-02-16 DIAGNOSIS — Z8673 Personal history of transient ischemic attack (TIA), and cerebral infarction without residual deficits: Secondary | ICD-10-CM | POA: Insufficient documentation

## 2011-02-16 DIAGNOSIS — R0989 Other specified symptoms and signs involving the circulatory and respiratory systems: Secondary | ICD-10-CM

## 2011-02-16 HISTORY — PX: CARDIOVERSION: SHX1299

## 2011-02-16 LAB — PROTIME-INR
INR: 1.88 — ABNORMAL HIGH (ref 0.00–1.49)
Prothrombin Time: 21.9 seconds — ABNORMAL HIGH (ref 11.6–15.2)

## 2011-02-16 SURGERY — CARDIOVERSION
Anesthesia: General | Wound class: Clean

## 2011-02-16 MED ORDER — SODIUM CHLORIDE 0.9 % IJ SOLN: 3.0000 mL | Freq: Two times a day (BID) | INTRAMUSCULAR | Status: DC

## 2011-02-16 MED ORDER — HYDROCORTISONE 1 % EX CREA
1.0000 "application " | TOPICAL_CREAM | Freq: Three times a day (TID) | CUTANEOUS | Status: DC | PRN
  Filled 2011-02-16: qty 28

## 2011-02-16 MED ORDER — SODIUM CHLORIDE 0.9 % IV SOLN: 250.0000 mL | INTRAVENOUS | Status: DC | PRN

## 2011-02-16 MED ORDER — HYDROCORTISONE 1 % EX CREA: 1.0000 "application " | TOPICAL_CREAM | Freq: Three times a day (TID) | CUTANEOUS | Status: DC | PRN

## 2011-02-16 MED ORDER — SODIUM CHLORIDE 0.9 % IV SOLN: INTRAVENOUS | Status: DC

## 2011-02-16 MED ORDER — SODIUM CHLORIDE 0.9 % IJ SOLN: 3.0000 mL | INTRAMUSCULAR | Status: DC | PRN

## 2011-02-16 NOTE — Anesthesia Preprocedure Evaluation (Deleted)
Anesthesia Evaluation  Patient identified by MRN, date of birth, ID band Patient awake    Reviewed: Allergy & Precautions, H&P , NPO status , Patient's Chart, lab work & pertinent test results, reviewed documented beta blocker date and time   Airway Mallampati: II      Dental  (+) Teeth Intact   Pulmonary          Cardiovascular + Past MI and +CHF + pacemaker + Cardiac Defibrillator     Neuro/Psych CVA    GI/Hepatic GERD-  ,  Endo/Other    Renal/GU      Musculoskeletal   Abdominal   Peds  Hematology   Anesthesia Other Findings   Reproductive/Obstetrics                           Anesthesia Physical Anesthesia Plan  ASA: III  Anesthesia Plan: General   Post-op Pain Management:    Induction: Intravenous  Airway Management Planned: Mask  Additional Equipment:   Intra-op Plan:   Post-operative Plan:   Informed Consent: I have reviewed the patients History and Physical, chart, labs and discussed the procedure including the risks, benefits and alternatives for the proposed anesthesia with the patient or authorized representative who has indicated his/her understanding and acceptance.   Dental advisory given  Plan Discussed with:   Anesthesia Plan Comments:         Anesthesia Quick Evaluation

## 2011-02-16 NOTE — Preoperative (Signed)
Beta Blockers   Reason not to administer Beta Blockers:Not Applicable 

## 2011-02-17 ENCOUNTER — Encounter (HOSPITAL_COMMUNITY): Payer: Self-pay | Admitting: Cardiology

## 2011-02-20 ENCOUNTER — Ambulatory Visit (INDEPENDENT_AMBULATORY_CARE_PROVIDER_SITE_OTHER): Payer: Self-pay | Admitting: Cardiovascular Disease

## 2011-02-20 ENCOUNTER — Ambulatory Visit (INDEPENDENT_AMBULATORY_CARE_PROVIDER_SITE_OTHER): Payer: Medicare Other | Admitting: Nurse Practitioner

## 2011-02-20 ENCOUNTER — Encounter: Payer: Self-pay | Admitting: Nurse Practitioner

## 2011-02-20 ENCOUNTER — Telehealth: Payer: Self-pay | Admitting: Cardiology

## 2011-02-20 VITALS — BP 126/60 | HR 61 | Ht 75.0 in | Wt 218.0 lb

## 2011-02-20 DIAGNOSIS — I4891 Unspecified atrial fibrillation: Secondary | ICD-10-CM

## 2011-02-20 DIAGNOSIS — R0989 Other specified symptoms and signs involving the circulatory and respiratory systems: Secondary | ICD-10-CM

## 2011-02-20 DIAGNOSIS — Z7901 Long term (current) use of anticoagulants: Secondary | ICD-10-CM

## 2011-02-20 DIAGNOSIS — I2589 Other forms of chronic ischemic heart disease: Secondary | ICD-10-CM

## 2011-02-20 DIAGNOSIS — I255 Ischemic cardiomyopathy: Secondary | ICD-10-CM

## 2011-02-20 LAB — PROTIME-INR
INR: 2.2 ratio — ABNORMAL HIGH (ref 0.8–1.0)
Prothrombin Time: 24.2 s — ABNORMAL HIGH (ref 10.2–12.4)

## 2011-02-20 LAB — BASIC METABOLIC PANEL
BUN: 17 mg/dL (ref 6–23)
CO2: 29 mEq/L (ref 19–32)
Calcium: 8.8 mg/dL (ref 8.4–10.5)
Chloride: 100 mEq/L (ref 96–112)
Creatinine, Ser: 1.1 mg/dL (ref 0.4–1.5)
GFR: 71.25 mL/min (ref >60.00)
Glucose, Bld: 90 mg/dL (ref 70–99)
Potassium: 4.4 mEq/L (ref 3.5–5.1)
Sodium: 136 mEq/L (ref 135–145)

## 2011-02-20 MED ORDER — DIGOXIN 125 MCG PO TABS: ORAL_TABLET | ORAL | Status: DC

## 2011-02-20 NOTE — Assessment & Plan Note (Addendum)
He remains in atrial fib. I do not think his current symptoms are related except I do worry about worsening bradycardia. I do not believe that he has an atrial lead in place. He knows how to check his pulse. I will have him check his heart rate during one of his dizzy spells/blurred vision. We are checking labs today and I have already cut the dose of Digoxin back to a half a tablet. I will see again in 3 weeks. His plans for cardioversion have been placed on hold until his INR is therapeutic for the next several weeks. Needs to have weekly INR's. Patient is agreeable to this plan and will call if any problems develop in the interim.   Per the device clinic - no atrial lead. Set at VVI at 40 for back up.

## 2011-02-20 NOTE — Assessment & Plan Note (Signed)
He looks compensated. I have left him on his current dose of Coreg at 12. 5 mg. Digoxin is cut in half today due to possible side effects. I will message the device clinic to see what his pacing rate is set at and if he has an atrial lead in place.

## 2011-02-20 NOTE — Assessment & Plan Note (Signed)
Appears like we have some discrepancy with the INR's - venipuncture v/s finger prick. We are checking today by venipuncture and he will be checking at home.

## 2011-02-20 NOTE — Patient Instructions (Addendum)
Cut the digoxin in half.   I want you to check your pulse when you get blurred vision or dizzy  We will check your labs today.  I will see you in 3 weeks.

## 2011-02-20 NOTE — Progress Notes (Signed)
[name] Date of Birth: [birth year] Medical Record #[id number]  History of Present Illness: [name] is seen back today for a one week check. It is a post hospital visit. He is seen for Dr. Riley Kill. He has had recurrent atrial fib. Digoxin started for rate control. We had arranged for a cardioversion on December the 6th. The procedure was cancelled. His INR was 1.88. However at home he had gotten a reading of 2.2 on the day before and the day of the procedure. Nevertheless, it was cancelled. INR goal is now about 3. He usually has to take 7.5 mg x 2 and 5 mg x 5 to reach this range.   His only complaint is of transient dizziness and visual disturbances. He notes these "flashes" in his eyes. His vision gets blurred when he tries to read. He will have some dizziness as well. No syncope. No shocks. He has no more "quivering". He has been a little nauseous. No vomiting. He is anxious to return to cardiac rehab.   Current Outpatient Prescriptions on File Prior to Visit  Medication Sig Dispense Refill  . alfuzosin (UROXATRAL) 10 MG 24 hr tablet Take 10 mg by mouth daily.        Marland Kitchen aspirin 81 MG chewable tablet Chew 81 mg by mouth daily.       Marland Kitchen atorvastatin (LIPITOR) 10 MG tablet Take 1 tablet (10 mg total) by mouth daily.  30 tablet  5  . carvedilol (COREG) 12.5 MG tablet Take 1 tablet (12.5 mg total) by mouth 2 (two) times daily.  60 tablet  6  . furosemide (LASIX) 20 MG tablet Take 20 mg by mouth every 3 (three) days.        Marland Kitchen levothyroxine (SYNTHROID, LEVOTHROID) 150 MCG tablet Take 150 mcg by mouth daily.        . Multiple Vitamin (MULTIVITAMIN) tablet Take 1 tablet by mouth daily.        Marland Kitchen omeprazole (PRILOSEC OTC) 20 MG tablet Take 20 mg by mouth as needed. For acid reflux      . psyllium (METAMUCIL) 58.6 % powder Take 1 packet by mouth as needed. For constipation      . warfarin (COUMADIN) 5 MG tablet Take 2.5-5 mg by mouth daily. Monday only- 0.5 half tab (2.5mg ); All other days 1 tab (5mg )       . DISCONTD: digoxin (LANOXIN) 0.125 MG tablet Take 1 tablet (125 mcg total) by mouth daily.  30 tablet  11    Allergies  Allergen Reactions  . Antihistamines, Diphenhydramine-Type Other (See Comments)    unknown  . Penicillins Other (See Comments)    Couldn't breathe.    Past Medical History  Diagnosis Date  . Ischemic cardiomyopathy 05/2010    Has EF of 25%  . Diverticulitis     CURRENTLY CONTROLLED WITH NO EVIDENCE OF RECURRENT INFECTION  . AF (atrial fibrillation)     Has not tolerated amiodarone in the past. Amiodarone was stopped in September of 2010 due to side effects  . Chronic anticoagulation     on coumadin  . ICD (implantable cardiac defibrillator) in place   . Prostate cancer   . Osteoarthritis     RIGHT KNEE  . Aortic stenosis, severe     WITH PERCUTANEOUS AORTIC VALVE (TAVI) IN March 2012 at the [facility]  . MI (myocardial infarction) 1974, 17  . Cervical myelopathy   . Pulmonary embolism   . Hyperlipidemia   . GERD (gastroesophageal reflux  disease)   . Esophageal stricture     WITH DILATATION  . NSVT (nonsustained ventricular tachycardia)     Past Surgical History  Procedure Date  . Icd Feb 2003  . Coronary artery bypass graft 1979  . Coronary artery bypass graft 1991    REDO SURGERY  . Cardiac catheterization 2010    SEVERE LV DYSFUNCTION WITH ESTIMATED EJECTION FRACTION OF 25%  . Cholecystectomy   . Aortic valve replacement     Percutaneous AVR in March 2012 at the [facility]  . Cardioversion 02/16/2011    Procedure: CARDIOVERSION;  Surgeon: Luis Abed, MD;  Location: [facility] OR;  Service: Cardiovascular;  Laterality: N/A;    History  Smoking status  . Former Smoker -- 4.0 packs/day for 15 years  . Types: Cigarettes  . Quit date: 06/15/1969  Smokeless tobacco  . Never Used    History  Alcohol Use No    Family History  Problem Relation Age of Onset  . Heart disease Mother 36  . Diabetes Mother 67  . Heart disease  Father 22    Review of Systems: The review of systems is per the HPI. No chest pain. No further palpitations/quivering since Digoxin was added.  All other systems were reviewed and are negative.  Physical Exam: BP 126/60  Pulse 61  Ht 6\' 3"  (1.905 m)  Wt 218 lb (98.884 kg)  BMI 27.25 kg/m2 Patient is very pleasant and in no acute distress. Skin is warm and dry. Color is normal.  HEENT is unremarkable. Normocephalic/atraumatic. PERRL. Sclera are nonicteric. Neck is supple. No masses. No JVD. Lungs are clear. Cardiac exam shows a fairly regular rate and rhythm. Abdomen is soft. Extremities are without edema. Gait and ROM are intact. No gross neurologic deficits noted.   LABORATORY DATA: PENDING. His last dose of Digoxin was about 4 hours prior to his visit. EKG today shows atrial fib. Rate is controlled at 61.    Assessment / Plan:

## 2011-02-20 NOTE — Telephone Encounter (Signed)
New message:  Seen [name] this am and was told to ck coumadin at home and call back the results for comparison.  2.2 was his home reading.  Please make sure [name] gets this info.  Pt call back Number [phone].

## 2011-02-21 ENCOUNTER — Encounter (HOSPITAL_COMMUNITY)
Admission: RE | Admit: 2011-02-21 | Discharge: 2011-02-21 | Disposition: A | Discharge status: Home / Self Care | Payer: Medicare Other | Source: Ambulatory Visit | Attending: Cardiology | Admitting: Cardiology

## 2011-02-21 LAB — DIGOXIN LEVEL: Digoxin Level: 0.9 ng/mL (ref 0.8–2.0)

## 2011-02-22 ENCOUNTER — Encounter (HOSPITAL_COMMUNITY)
Admission: RE | Admit: 2011-02-22 | Discharge: 2011-02-22 | Disposition: A | Discharge status: Home / Self Care | Payer: Medicare Other | Source: Ambulatory Visit | Attending: Cardiology | Admitting: Cardiology

## 2011-02-23 ENCOUNTER — Encounter (HOSPITAL_COMMUNITY)
Admission: RE | Admit: 2011-02-23 | Discharge: 2011-02-23 | Disposition: A | Discharge status: Home / Self Care | Payer: Medicare Other | Source: Ambulatory Visit | Attending: Cardiology | Admitting: Cardiology

## 2011-02-27 ENCOUNTER — Ambulatory Visit (INDEPENDENT_AMBULATORY_CARE_PROVIDER_SITE_OTHER): Payer: Self-pay | Admitting: Cardiology

## 2011-02-27 DIAGNOSIS — R0989 Other specified symptoms and signs involving the circulatory and respiratory systems: Secondary | ICD-10-CM

## 2011-02-27 DIAGNOSIS — I4891 Unspecified atrial fibrillation: Secondary | ICD-10-CM

## 2011-02-27 DIAGNOSIS — Z7901 Long term (current) use of anticoagulants: Secondary | ICD-10-CM

## 2011-02-27 LAB — POCT INR: INR: 2.8

## 2011-02-28 ENCOUNTER — Encounter (HOSPITAL_COMMUNITY)
Admission: RE | Admit: 2011-02-28 | Discharge: 2011-02-28 | Disposition: A | Discharge status: Home / Self Care | Payer: Medicare Other | Source: Ambulatory Visit | Attending: Cardiology | Admitting: Cardiology

## 2011-03-01 ENCOUNTER — Encounter (HOSPITAL_COMMUNITY): Payer: Medicare Other

## 2011-03-02 ENCOUNTER — Encounter (HOSPITAL_COMMUNITY): Payer: Medicare Other

## 2011-03-06 ENCOUNTER — Ambulatory Visit (INDEPENDENT_AMBULATORY_CARE_PROVIDER_SITE_OTHER): Payer: Self-pay | Admitting: Cardiovascular Disease

## 2011-03-06 DIAGNOSIS — I4891 Unspecified atrial fibrillation: Secondary | ICD-10-CM

## 2011-03-06 DIAGNOSIS — R0989 Other specified symptoms and signs involving the circulatory and respiratory systems: Secondary | ICD-10-CM

## 2011-03-06 DIAGNOSIS — Z7901 Long term (current) use of anticoagulants: Secondary | ICD-10-CM

## 2011-03-06 LAB — POCT INR: INR: 2.9

## 2011-03-07 ENCOUNTER — Encounter (HOSPITAL_COMMUNITY): Payer: Medicare Other

## 2011-03-08 ENCOUNTER — Encounter (HOSPITAL_COMMUNITY): Payer: Medicare Other

## 2011-03-09 ENCOUNTER — Encounter (HOSPITAL_COMMUNITY): Payer: Medicare Other

## 2011-03-10 ENCOUNTER — Telehealth: Payer: Self-pay | Admitting: Cardiology

## 2011-03-10 MED ORDER — AZITHROMYCIN 250 MG PO TABS: ORAL_TABLET | ORAL | Status: AC

## 2011-03-10 NOTE — Telephone Encounter (Signed)
Pt is very sick coughing up sputum and chest conjestion and needs a antibiotic. He has an appt set up to see his new pcp but it is next week

## 2011-03-10 NOTE — Telephone Encounter (Signed)
Dr Riley Kill called and spoke with this patient about symptoms.  Dr Riley Kill will give the pt a Zpak at this time and recommends Robitussin DM OTC for cough. The pt does have an INR machine at home and Dr Riley Kill has instructed him to monitor his INR while on antibiotic. I spoke with the pt and made him aware that Rx for Zpak has been sent to the pharmacy.

## 2011-03-13 ENCOUNTER — Telehealth: Payer: Self-pay | Admitting: Nurse Practitioner

## 2011-03-13 ENCOUNTER — Ambulatory Visit: Payer: Medicare Other | Admitting: Nurse Practitioner

## 2011-03-13 ENCOUNTER — Ambulatory Visit (INDEPENDENT_AMBULATORY_CARE_PROVIDER_SITE_OTHER): Payer: Self-pay | Admitting: Internal Medicine

## 2011-03-13 DIAGNOSIS — R0989 Other specified symptoms and signs involving the circulatory and respiratory systems: Secondary | ICD-10-CM

## 2011-03-13 DIAGNOSIS — I4891 Unspecified atrial fibrillation: Secondary | ICD-10-CM

## 2011-03-13 DIAGNOSIS — Z7901 Long term (current) use of anticoagulants: Secondary | ICD-10-CM

## 2011-03-13 NOTE — Telephone Encounter (Signed)
New msg  Pt called and canceled today's appointment and wanted to talk to you also

## 2011-03-13 NOTE — Telephone Encounter (Signed)
Spoke with patient about his appointment that was scheduled today. He wasn't feeling to well, so he will call back Thursday and schedule a follow up with lori

## 2011-03-14 ENCOUNTER — Encounter (HOSPITAL_COMMUNITY): Payer: Self-pay

## 2011-03-15 ENCOUNTER — Encounter (HOSPITAL_COMMUNITY): Payer: Self-pay

## 2011-03-16 ENCOUNTER — Telehealth: Payer: Self-pay | Admitting: Nurse Practitioner

## 2011-03-16 ENCOUNTER — Encounter (HOSPITAL_COMMUNITY): Payer: Self-pay

## 2011-03-16 DIAGNOSIS — Z951 Presence of aortocoronary bypass graft: Secondary | ICD-10-CM | POA: Insufficient documentation

## 2011-03-16 DIAGNOSIS — R0989 Other specified symptoms and signs involving the circulatory and respiratory systems: Secondary | ICD-10-CM | POA: Insufficient documentation

## 2011-03-16 DIAGNOSIS — I472 Ventricular tachycardia, unspecified: Secondary | ICD-10-CM | POA: Insufficient documentation

## 2011-03-16 DIAGNOSIS — I2589 Other forms of chronic ischemic heart disease: Secondary | ICD-10-CM | POA: Insufficient documentation

## 2011-03-16 DIAGNOSIS — I4891 Unspecified atrial fibrillation: Secondary | ICD-10-CM | POA: Insufficient documentation

## 2011-03-16 DIAGNOSIS — Z5189 Encounter for other specified aftercare: Secondary | ICD-10-CM | POA: Insufficient documentation

## 2011-03-16 DIAGNOSIS — I359 Nonrheumatic aortic valve disorder, unspecified: Secondary | ICD-10-CM | POA: Insufficient documentation

## 2011-03-16 DIAGNOSIS — Z95810 Presence of automatic (implantable) cardiac defibrillator: Secondary | ICD-10-CM | POA: Insufficient documentation

## 2011-03-16 DIAGNOSIS — I4729 Other ventricular tachycardia: Secondary | ICD-10-CM | POA: Insufficient documentation

## 2011-03-16 NOTE — Telephone Encounter (Signed)
Follow up from previous call:  Returning call back to nurse. [name]

## 2011-03-16 NOTE — Telephone Encounter (Signed)
Please see 03/16/11 phone note for further documentation.

## 2011-03-16 NOTE — Telephone Encounter (Signed)
Left message to call back.  This pt was just given a Z-pak on 03/10/11. The pt does not need another antibiotic at this time.  The pt has been scheduled to establish with a PCP (Dr Timothy Lasso).

## 2011-03-16 NOTE — Telephone Encounter (Signed)
I spoke with the pt and he still has a productive cough (light Banita Lehn in color) and low-grade temp (99).  The pt states that his symptoms are improving since being on Z-pak.  I made the pt aware that at this time the Z-pak is still working in his body.  The pt is scheduled to see Dr Timothy Lasso on 03/28/11.  I instructed the pt to contact our office if he has worsening of his symptoms.  The pt agreed with plan.

## 2011-03-16 NOTE — Telephone Encounter (Signed)
New Problem:    Patient would like to know if he needed another Z-pak. He is still coughing up stuff and feeling under the weather.

## 2011-03-20 ENCOUNTER — Ambulatory Visit (INDEPENDENT_AMBULATORY_CARE_PROVIDER_SITE_OTHER): Payer: Self-pay | Admitting: Cardiovascular Disease

## 2011-03-20 ENCOUNTER — Other Ambulatory Visit: Payer: Self-pay | Admitting: Cardiology

## 2011-03-20 DIAGNOSIS — Z7901 Long term (current) use of anticoagulants: Secondary | ICD-10-CM

## 2011-03-20 DIAGNOSIS — I4891 Unspecified atrial fibrillation: Secondary | ICD-10-CM

## 2011-03-20 DIAGNOSIS — R0989 Other specified symptoms and signs involving the circulatory and respiratory systems: Secondary | ICD-10-CM

## 2011-03-21 ENCOUNTER — Encounter: Payer: Self-pay | Admitting: Nurse Practitioner

## 2011-03-21 ENCOUNTER — Encounter (HOSPITAL_COMMUNITY): Payer: Self-pay

## 2011-03-21 ENCOUNTER — Ambulatory Visit (INDEPENDENT_AMBULATORY_CARE_PROVIDER_SITE_OTHER): Payer: Medicare Other | Admitting: Nurse Practitioner

## 2011-03-21 VITALS — BP 108/70 | HR 64 | Ht 75.5 in | Wt 213.0 lb

## 2011-03-21 DIAGNOSIS — Z7901 Long term (current) use of anticoagulants: Secondary | ICD-10-CM | POA: Diagnosis not present

## 2011-03-21 DIAGNOSIS — I4891 Unspecified atrial fibrillation: Secondary | ICD-10-CM | POA: Diagnosis not present

## 2011-03-21 DIAGNOSIS — I359 Nonrheumatic aortic valve disorder, unspecified: Secondary | ICD-10-CM

## 2011-03-21 DIAGNOSIS — I255 Ischemic cardiomyopathy: Secondary | ICD-10-CM

## 2011-03-21 DIAGNOSIS — Z952 Presence of prosthetic heart valve: Secondary | ICD-10-CM

## 2011-03-21 DIAGNOSIS — I2589 Other forms of chronic ischemic heart disease: Secondary | ICD-10-CM

## 2011-03-21 NOTE — Telephone Encounter (Signed)
I spoke with the pt to see how he was feeling and he still has a cough but feels much better.  The pt said that he was seen today by Norma Fredrickson NP.

## 2011-03-21 NOTE — Progress Notes (Signed)
[name] Date of Birth: [birth year] Medical Record #[id number]  History of Present Illness: [name] is seen back today for a follow up visit. He is seen for Dr. Riley Kill. He has not proceeded on with plans for cardioversion. He has gotten sick with a pretty bad URI. Has had a round of antibiotics. Now doing better but still with a little cough that is productive. No more fevers or chills. We cut his digoxin back at the last visit. His visual issues have basically resolved. He remains in atrial fib but is not very symptomatic at this time. Has plans to go back to [facility] for his valve follow later this month. His coumadin levels have been good over the last month. He continues to check weekly. He wants to hold off until after that appointment to proceed on with cardioversion.  Current Outpatient Prescriptions on File Prior to Visit  Medication Sig Dispense Refill  . alfuzosin (UROXATRAL) 10 MG 24 hr tablet Take 10 mg by mouth daily.        Marland Kitchen aspirin 81 MG chewable tablet Chew 81 mg by mouth daily.       . carvedilol (COREG) 12.5 MG tablet Take 1 tablet (12.5 mg total) by mouth 2 (two) times daily.  60 tablet  6  . digoxin (LANOXIN) 0.125 MG tablet Take only a half of a tablet daily  30 tablet  11  . furosemide (LASIX) 20 MG tablet Take 20 mg by mouth every 3 (three) days.        Marland Kitchen levothyroxine (SYNTHROID, LEVOTHROID) 150 MCG tablet Take 150 mcg by mouth daily.        Marland Kitchen LIPITOR 10 MG tablet TAKE 1 TABLET ONCE DAILY.  30 each  6  . Multiple Vitamin (MULTIVITAMIN) tablet Take 1 tablet by mouth daily.        Marland Kitchen omeprazole (PRILOSEC OTC) 20 MG tablet Take 20 mg by mouth as needed. For acid reflux      . psyllium (METAMUCIL) 58.6 % powder Take 1 packet by mouth as needed. For constipation      . warfarin (COUMADIN) 5 MG tablet Take 2.5-5 mg by mouth daily. Monday only- 0.5 half tab (2.5mg ); All other days 1 tab (5mg )         Allergies  Allergen Reactions  . Antihistamines, Diphenhydramine-Type  Other (See Comments)    unknown  . Penicillins Other (See Comments)    Couldn't breathe.    Past Medical History  Diagnosis Date  . Ischemic cardiomyopathy 05/2010    Has EF of 25%  . Diverticulitis     CURRENTLY CONTROLLED WITH NO EVIDENCE OF RECURRENT INFECTION  . AF (atrial fibrillation)     Has not tolerated amiodarone in the past. Amiodarone was stopped in September of 2010 due to side effects  . Chronic anticoagulation     on coumadin  . ICD (implantable cardiac defibrillator) in place   . Prostate cancer   . Osteoarthritis     RIGHT KNEE  . Aortic stenosis, severe     WITH PERCUTANEOUS AORTIC VALVE (TAVI) IN March 2012 at the [name]  . MI (myocardial infarction) 1974, 40  . Cervical myelopathy   . Pulmonary embolism   . Hyperlipidemia   . GERD (gastroesophageal reflux disease)   . Esophageal stricture     WITH DILATATION  . NSVT (nonsustained ventricular tachycardia)     Past Surgical History  Procedure Date  . Icd Feb 2003  . Coronary artery bypass  graft 1979  . Coronary artery bypass graft 1991    REDO SURGERY  . Cardiac catheterization 2010    SEVERE LV DYSFUNCTION WITH ESTIMATED EJECTION FRACTION OF 25%  . Cholecystectomy   . Aortic valve replacement     Percutaneous AVR in March 2012 at the [facility]  . Cardioversion 02/16/2011    Procedure: CARDIOVERSION;  Surgeon: Luis Abed, MD;  Location: [facility] OR;  Service: Cardiovascular;  Laterality: N/A;    History  Smoking status  . Former Smoker -- 4.0 packs/day for 15 years  . Types: Cigarettes  . Quit date: 06/15/1969  Smokeless tobacco  . Never Used    History  Alcohol Use No    Family History  Problem Relation Age of Onset  . Heart disease Mother 6  . Diabetes Mother 48  . Heart disease Father 45    Review of Systems: The review of systems is positive for recent URI that is resolving.  All other systems were reviewed and are negative.  Physical Exam: BP 108/70  Pulse  64  Ht 6' 3.5" (1.918 m)  Wt 213 lb (96.616 kg)  BMI 26.27 kg/m2 Patient is very pleasant and in no acute distress. Skin is warm and dry. Color is normal.  HEENT is unremarkable. Normocephalic/atraumatic. PERRL. Sclera are nonicteric. Neck is supple. No masses. No JVD. Lungs are clear. Cardiac exam shows an irregular rhythm. Rate is controlled. Abdomen is soft. Extremities are without edema. Gait and ROM are intact. No gross neurologic deficits noted.  LABORATORY DATA: INR flowsheet reviewed.  Assessment / Plan:

## 2011-03-21 NOTE — Assessment & Plan Note (Signed)
His INR flowsheet has been reviewed. Will continue with weekly INR's until cardioversion is performed.

## 2011-03-21 NOTE — Patient Instructions (Signed)
I will see you in 3 weeks. Stay on your current medicines. We need to keep checking your coumadin levels weekly.  We will plan on setting up cardioversion at that time if you are feeling better.  Call the [facility] office at [phone] if you have any questions, problems or concerns.

## 2011-03-21 NOTE — Assessment & Plan Note (Signed)
He remains in atrial fib by physical exam. Has backup VVI pacer set at 40. No atrial lead in place. He seems to be feeling better. He wants to hold off on cardioversion until his cold is resolved and after his visit to [facility]. I will see him back in 3 weeks. Patient is agreeable to this plan and will call if any problems develop in the interim.

## 2011-03-21 NOTE — Assessment & Plan Note (Signed)
Has follow up visit for his TAVI in [place] later this month.

## 2011-03-21 NOTE — Assessment & Plan Note (Signed)
He looks compensated. I have left him on his current regimen. I am not convinced that he can tolerate more Coreg at this point. We will give consideration in the future. Patient is agreeable to this plan and will call if any problems develop in the interim.

## 2011-03-22 ENCOUNTER — Encounter (HOSPITAL_COMMUNITY): Payer: Self-pay

## 2011-03-23 ENCOUNTER — Encounter (HOSPITAL_COMMUNITY): Payer: Self-pay

## 2011-03-27 ENCOUNTER — Ambulatory Visit (INDEPENDENT_AMBULATORY_CARE_PROVIDER_SITE_OTHER): Payer: Self-pay | Admitting: Cardiology

## 2011-03-27 DIAGNOSIS — R0989 Other specified symptoms and signs involving the circulatory and respiratory systems: Secondary | ICD-10-CM

## 2011-03-27 DIAGNOSIS — Z7901 Long term (current) use of anticoagulants: Secondary | ICD-10-CM

## 2011-03-27 DIAGNOSIS — I4891 Unspecified atrial fibrillation: Secondary | ICD-10-CM

## 2011-03-27 LAB — POCT INR: INR: 2.8

## 2011-03-28 ENCOUNTER — Encounter (HOSPITAL_COMMUNITY): Payer: Self-pay

## 2011-03-28 DIAGNOSIS — E039 Hypothyroidism, unspecified: Secondary | ICD-10-CM | POA: Diagnosis not present

## 2011-03-28 DIAGNOSIS — I428 Other cardiomyopathies: Secondary | ICD-10-CM | POA: Diagnosis not present

## 2011-03-28 DIAGNOSIS — I2510 Atherosclerotic heart disease of native coronary artery without angina pectoris: Secondary | ICD-10-CM | POA: Diagnosis not present

## 2011-03-28 DIAGNOSIS — I359 Nonrheumatic aortic valve disorder, unspecified: Secondary | ICD-10-CM | POA: Diagnosis not present

## 2011-03-29 ENCOUNTER — Encounter (HOSPITAL_COMMUNITY)
Admission: RE | Admit: 2011-03-29 | Discharge: 2011-03-29 | Disposition: A | Discharge status: Home / Self Care | Payer: Self-pay | Source: Ambulatory Visit | Attending: Cardiology | Admitting: Cardiology

## 2011-03-30 ENCOUNTER — Encounter (HOSPITAL_COMMUNITY)
Admission: RE | Admit: 2011-03-30 | Discharge: 2011-03-30 | Disposition: A | Discharge status: Home / Self Care | Payer: Self-pay | Source: Ambulatory Visit | Attending: Cardiology | Admitting: Cardiology

## 2011-04-03 ENCOUNTER — Ambulatory Visit (INDEPENDENT_AMBULATORY_CARE_PROVIDER_SITE_OTHER): Payer: Self-pay | Admitting: Cardiovascular Disease

## 2011-04-03 DIAGNOSIS — Z7901 Long term (current) use of anticoagulants: Secondary | ICD-10-CM

## 2011-04-03 DIAGNOSIS — I4891 Unspecified atrial fibrillation: Secondary | ICD-10-CM

## 2011-04-03 DIAGNOSIS — R0989 Other specified symptoms and signs involving the circulatory and respiratory systems: Secondary | ICD-10-CM

## 2011-04-04 ENCOUNTER — Encounter (HOSPITAL_COMMUNITY)
Admission: RE | Admit: 2011-04-04 | Discharge: 2011-04-04 | Disposition: A | Discharge status: Home / Self Care | Payer: Self-pay | Source: Ambulatory Visit | Attending: Cardiology | Admitting: Cardiology

## 2011-04-04 NOTE — Progress Notes (Signed)
[name] reported he walked about 5 minutes on the Treadmill, first station of exercise today, when he experienced a painful "catch" below his Left knee. He stopped the treadmill and sat down. He said he had never experienced any prior problems with his left knee or the area below the knee but has a history of Right knee issues. I instructed him to sit for five minutes.  Within that time the pain resolved and he was able to proceed with exercise the remaining time in Cardiac rehab without any return of the pain or "catch". He was instructed to only do the recumbent bike and air dyne bike. [name] was also instructed to follow up with his Orthopaedic physician. He said he would call his Physician today.

## 2011-04-05 ENCOUNTER — Encounter (HOSPITAL_COMMUNITY)
Admission: RE | Admit: 2011-04-05 | Discharge: 2011-04-05 | Disposition: A | Discharge status: Home / Self Care | Payer: Self-pay | Source: Ambulatory Visit | Attending: Cardiology | Admitting: Cardiology

## 2011-04-06 ENCOUNTER — Encounter (HOSPITAL_COMMUNITY)
Admission: RE | Admit: 2011-04-06 | Discharge: 2011-04-06 | Disposition: A | Discharge status: Home / Self Care | Payer: Self-pay | Source: Ambulatory Visit | Attending: Cardiology | Admitting: Cardiology

## 2011-04-07 DIAGNOSIS — R9431 Abnormal electrocardiogram [ECG] [EKG]: Secondary | ICD-10-CM | POA: Diagnosis not present

## 2011-04-07 DIAGNOSIS — Z95810 Presence of automatic (implantable) cardiac defibrillator: Secondary | ICD-10-CM | POA: Diagnosis not present

## 2011-04-07 DIAGNOSIS — I359 Nonrheumatic aortic valve disorder, unspecified: Secondary | ICD-10-CM | POA: Diagnosis not present

## 2011-04-07 DIAGNOSIS — I4891 Unspecified atrial fibrillation: Secondary | ICD-10-CM | POA: Diagnosis not present

## 2011-04-10 ENCOUNTER — Encounter: Payer: Self-pay | Admitting: Internal Medicine

## 2011-04-10 ENCOUNTER — Ambulatory Visit (INDEPENDENT_AMBULATORY_CARE_PROVIDER_SITE_OTHER): Payer: Self-pay | Admitting: Internal Medicine

## 2011-04-10 DIAGNOSIS — Z7901 Long term (current) use of anticoagulants: Secondary | ICD-10-CM | POA: Diagnosis not present

## 2011-04-10 DIAGNOSIS — C61 Malignant neoplasm of prostate: Secondary | ICD-10-CM | POA: Diagnosis not present

## 2011-04-10 DIAGNOSIS — R0989 Other specified symptoms and signs involving the circulatory and respiratory systems: Secondary | ICD-10-CM

## 2011-04-10 DIAGNOSIS — I4891 Unspecified atrial fibrillation: Secondary | ICD-10-CM

## 2011-04-11 ENCOUNTER — Encounter (HOSPITAL_COMMUNITY)
Admission: RE | Admit: 2011-04-11 | Discharge: 2011-04-11 | Disposition: A | Discharge status: Home / Self Care | Payer: Self-pay | Source: Ambulatory Visit | Attending: Cardiology | Admitting: Cardiology

## 2011-04-11 ENCOUNTER — Ambulatory Visit (INDEPENDENT_AMBULATORY_CARE_PROVIDER_SITE_OTHER): Payer: Medicare Other | Admitting: Nurse Practitioner

## 2011-04-11 ENCOUNTER — Encounter: Payer: Self-pay | Admitting: Nurse Practitioner

## 2011-04-11 VITALS — BP 118/58 | HR 68 | Ht 75.5 in | Wt 213.0 lb

## 2011-04-11 DIAGNOSIS — I2589 Other forms of chronic ischemic heart disease: Secondary | ICD-10-CM

## 2011-04-11 DIAGNOSIS — R053 Chronic cough: Secondary | ICD-10-CM

## 2011-04-11 DIAGNOSIS — I4891 Unspecified atrial fibrillation: Secondary | ICD-10-CM

## 2011-04-11 DIAGNOSIS — Z7901 Long term (current) use of anticoagulants: Secondary | ICD-10-CM

## 2011-04-11 DIAGNOSIS — R05 Cough: Secondary | ICD-10-CM

## 2011-04-11 DIAGNOSIS — Z954 Presence of other heart-valve replacement: Secondary | ICD-10-CM | POA: Diagnosis not present

## 2011-04-11 DIAGNOSIS — Z952 Presence of prosthetic heart valve: Secondary | ICD-10-CM

## 2011-04-11 DIAGNOSIS — I255 Ischemic cardiomyopathy: Secondary | ICD-10-CM

## 2011-04-11 DIAGNOSIS — R059 Cough, unspecified: Secondary | ICD-10-CM

## 2011-04-11 MED ORDER — OMEPRAZOLE MAGNESIUM 20 MG PO TBEC: 20.0000 mg | DELAYED_RELEASE_TABLET | Freq: Every day | ORAL | Status: DC

## 2011-04-11 NOTE — Assessment & Plan Note (Signed)
He is going to try and wean himself off of his Uroxatral to see if this resolves his cough. He will increase his Prilosec to daily. May need to go back and see pulmonary if no improvement noted.

## 2011-04-11 NOTE — Progress Notes (Signed)
[name] Date of Birth: [birth year] Medical Record #[id number]  History of Present Illness: [name] is seen back today for a 3 week check. He is seen for Dr. Riley Kill. His history is quite complex and includes CAD, prior CABG, severe LV dysfunction with ICD in place, PAF, coumadin anticoagulation and recent TAVI in March 2012. He has recurrent atrial fib and is now on low dose Digoxin. He had some visual issues with the higher dose. He has been treated for a URI that was pretty persistent and he wanted to wait on cardioversion until that had cleared. His coumadin levels have been therapeutic over the last 4 to 6 weeks. He has been back to the [facility] for his TAVI this past Friday.  He comes in today. He is here with his wife.  He is doing well. He had a good visit in [place]. He had an extensive echo performed. EF is 31% per Cade's report. It was the feeling in [place] to leave him in atrial fib on coumadin. There is some slight chance that there could be a clot on the valve even though it was not seen on echo. [name] continues to have a dry cough and reported this to his physicians there in [place]. There is some possibility that this could be from his Uroxatral. He is going to try and taper off. If he does not have improvement, he will need to go back and see pulmonary. He does take PPI therapy but only every other day. From a heart standpoint, [name] is doing well. Energy level is good. No shortness of breath. He is working towards exercising every day.   He has also been to see Dr. Timothy Lasso. He had a good visit and is glad that he has established primary care there.   Current Outpatient Prescriptions on File Prior to Visit  Medication Sig Dispense Refill  . alfuzosin (UROXATRAL) 10 MG 24 hr tablet Take 10 mg by mouth daily.        Marland Kitchen aspirin 81 MG chewable tablet Chew 81 mg by mouth daily.       . carvedilol (COREG) 12.5 MG tablet Take 1 tablet (12.5 mg total) by mouth 2 (two) times  daily.  60 tablet  6  . digoxin (LANOXIN) 0.125 MG tablet Take only a half of a tablet daily  30 tablet  11  . furosemide (LASIX) 20 MG tablet Take 20 mg by mouth every 3 (three) days.        Marland Kitchen levothyroxine (SYNTHROID, LEVOTHROID) 150 MCG tablet Take 150 mcg by mouth daily.        Marland Kitchen LIPITOR 10 MG tablet TAKE 1 TABLET ONCE DAILY.  30 each  6  . Multiple Vitamin (MULTIVITAMIN) tablet Take 1 tablet by mouth daily.        Marland Kitchen omeprazole (PRILOSEC OTC) 20 MG tablet Take 20 mg by mouth as needed. For acid reflux      . psyllium (METAMUCIL) 58.6 % powder Take 1 packet by mouth as needed. For constipation      . warfarin (COUMADIN) 5 MG tablet Take 2.5-5 mg by mouth daily. Monday only- 0.5 half tab (2.5mg ); All other days 1 tab (5mg )         Allergies  Allergen Reactions  . Antihistamines, Diphenhydramine-Type Other (See Comments)    unknown  . Penicillins Other (See Comments)    Couldn't breathe.    Past Medical History  Diagnosis Date  . Ischemic cardiomyopathy 05/2010  Has EF of 25%  . Diverticulitis     CURRENTLY CONTROLLED WITH NO EVIDENCE OF RECURRENT INFECTION  . AF (atrial fibrillation)     Has not tolerated amiodarone in the past. Amiodarone was stopped in September of 2010 due to side effects  . Chronic anticoagulation     on coumadin  . ICD (implantable cardiac defibrillator) in place   . Prostate cancer   . Osteoarthritis     RIGHT KNEE  . Aortic stenosis, severe     WITH PERCUTANEOUS AORTIC VALVE (TAVI) IN March 2012 at the [facility]  . MI (myocardial infarction) 1974, 10  . Cervical myelopathy   . Pulmonary embolism   . Hyperlipidemia   . GERD (gastroesophageal reflux disease)   . Esophageal stricture     WITH DILATATION  . NSVT (nonsustained ventricular tachycardia)     Past Surgical History  Procedure Date  . Icd Feb 2003  . Coronary artery bypass graft 1979  . Coronary artery bypass graft 1991    REDO SURGERY  . Cardiac catheterization 2010     SEVERE LV DYSFUNCTION WITH ESTIMATED EJECTION FRACTION OF 25%  . Cholecystectomy   . Aortic valve replacement     Percutaneous AVR in March 2012 at the [facility]  . Cardioversion 02/16/2011    Procedure: CARDIOVERSION;  Surgeon: Luis Abed, MD;  Location: [facility] OR;  Service: Cardiovascular;  Laterality: N/A;    History  Smoking status  . Former Smoker -- 4.0 packs/day for 15 years  . Types: Cigarettes  . Quit date: 06/15/1969  Smokeless tobacco  . Never Used    History  Alcohol Use No    Family History  Problem Relation Age of Onset  . Heart disease Mother 49  . Diabetes Mother 25  . Heart disease Father 37    Review of Systems: The review of systems is per the HPI.  All other systems were reviewed and are negative.  Physical Exam: Ht 6' 3.5" (1.918 m)  Wt 213 lb (96.616 kg)  BMI 26.27 kg/m2 Patient is very pleasant and in no acute distress. Skin is warm and dry. Color is normal.  HEENT is unremarkable. Normocephalic/atraumatic. PERRL. Sclera are nonicteric. Neck is supple. No masses. No JVD. Lungs are clear. Cardiac exam shows an irregular rhythm. Rate is controlled. He has an outflow murmur. He is in atrial fib by exam with a controlled rate. Abdomen is soft. Extremities are without edema. Gait and ROM are intact. No gross neurologic deficits noted.   LABORATORY DATA:   Assessment / Plan:

## 2011-04-11 NOTE — Assessment & Plan Note (Signed)
He remains in atrial fib. He remains committed to Coumadin. He is not interested in pursuing cardioversion. He does not want to take the risk of stroke if there is a clot on his prosthetic valve, despite it not being seen on echo. He is feeling very well clinically and doing all activities that he wants to. I do not think we can get him feeling any better. He is not interested in antiarrhythmic therapy. He wants to "live his life" as suggested by his doctors in [place]. I also agree. We will see him back in 3 months. Patient is agreeable to this plan and will call if any problems develop in the interim.

## 2011-04-11 NOTE — Assessment & Plan Note (Signed)
We are not going to proceed with cardioversion. He can go to monthly checks.

## 2011-04-11 NOTE — Assessment & Plan Note (Signed)
Has had recent follow up in [place]. He got a good report. EF is 31%. They will be seeing him back in 6 months. We will see [name] back here in 3 months.

## 2011-04-11 NOTE — Patient Instructions (Signed)
Ok to wean yourself off of your Uroxatral.  Take your Prilosec every day.  Call me if your cough persists.  I will see you in  3 months.   Call the [facility] office at [phone] if you have any questions, problems or concerns.

## 2011-04-11 NOTE — Assessment & Plan Note (Signed)
His EF is 31%. He looks compensated and is doing well clinically. I have left him on his current regimen. Could consider increasing his Coreg in the future. He does not have an atrial lead and has back up pacing set at 40.

## 2011-04-12 ENCOUNTER — Encounter (HOSPITAL_COMMUNITY)
Admission: RE | Admit: 2011-04-12 | Discharge: 2011-04-12 | Disposition: A | Discharge status: Home / Self Care | Payer: Self-pay | Source: Ambulatory Visit | Attending: Cardiology | Admitting: Cardiology

## 2011-04-13 ENCOUNTER — Encounter (HOSPITAL_COMMUNITY)
Admission: RE | Admit: 2011-04-13 | Discharge: 2011-04-13 | Disposition: A | Discharge status: Home / Self Care | Payer: Self-pay | Source: Ambulatory Visit | Attending: Cardiology | Admitting: Cardiology

## 2011-04-14 ENCOUNTER — Other Ambulatory Visit: Payer: Self-pay | Admitting: Cardiology

## 2011-04-17 ENCOUNTER — Ambulatory Visit (INDEPENDENT_AMBULATORY_CARE_PROVIDER_SITE_OTHER): Payer: Self-pay | Admitting: Cardiology

## 2011-04-17 DIAGNOSIS — R0989 Other specified symptoms and signs involving the circulatory and respiratory systems: Secondary | ICD-10-CM

## 2011-04-17 DIAGNOSIS — Z7901 Long term (current) use of anticoagulants: Secondary | ICD-10-CM

## 2011-04-17 DIAGNOSIS — I4891 Unspecified atrial fibrillation: Secondary | ICD-10-CM

## 2011-04-17 LAB — POCT INR: INR: 3

## 2011-04-18 ENCOUNTER — Encounter (HOSPITAL_COMMUNITY)
Admission: RE | Admit: 2011-04-18 | Discharge: 2011-04-18 | Disposition: A | Discharge status: Home / Self Care | Payer: Self-pay | Source: Ambulatory Visit | Attending: Cardiology | Admitting: Cardiology

## 2011-04-18 DIAGNOSIS — I4891 Unspecified atrial fibrillation: Secondary | ICD-10-CM | POA: Insufficient documentation

## 2011-04-18 DIAGNOSIS — I359 Nonrheumatic aortic valve disorder, unspecified: Secondary | ICD-10-CM | POA: Insufficient documentation

## 2011-04-18 DIAGNOSIS — R0989 Other specified symptoms and signs involving the circulatory and respiratory systems: Secondary | ICD-10-CM | POA: Insufficient documentation

## 2011-04-18 DIAGNOSIS — Z5189 Encounter for other specified aftercare: Secondary | ICD-10-CM | POA: Insufficient documentation

## 2011-04-18 DIAGNOSIS — I4729 Other ventricular tachycardia: Secondary | ICD-10-CM | POA: Insufficient documentation

## 2011-04-18 DIAGNOSIS — I2589 Other forms of chronic ischemic heart disease: Secondary | ICD-10-CM | POA: Insufficient documentation

## 2011-04-18 DIAGNOSIS — I472 Ventricular tachycardia, unspecified: Secondary | ICD-10-CM | POA: Insufficient documentation

## 2011-04-18 DIAGNOSIS — Z95810 Presence of automatic (implantable) cardiac defibrillator: Secondary | ICD-10-CM | POA: Insufficient documentation

## 2011-04-18 DIAGNOSIS — Z951 Presence of aortocoronary bypass graft: Secondary | ICD-10-CM | POA: Insufficient documentation

## 2011-04-19 ENCOUNTER — Encounter (HOSPITAL_COMMUNITY)
Admission: RE | Admit: 2011-04-19 | Discharge: 2011-04-19 | Disposition: A | Discharge status: Home / Self Care | Payer: Self-pay | Source: Ambulatory Visit | Attending: Cardiology | Admitting: Cardiology

## 2011-04-20 ENCOUNTER — Encounter: Payer: Medicare Other | Admitting: *Deleted

## 2011-04-20 ENCOUNTER — Encounter (HOSPITAL_COMMUNITY): Payer: Self-pay

## 2011-04-21 ENCOUNTER — Encounter (HOSPITAL_COMMUNITY)
Admission: RE | Admit: 2011-04-21 | Discharge: 2011-04-21 | Disposition: A | Discharge status: Home / Self Care | Payer: Self-pay | Source: Ambulatory Visit | Attending: Cardiology | Admitting: Cardiology

## 2011-04-24 ENCOUNTER — Encounter: Payer: Self-pay | Admitting: *Deleted

## 2011-04-24 DIAGNOSIS — H16229 Keratoconjunctivitis sicca, not specified as Sjogren's, unspecified eye: Secondary | ICD-10-CM | POA: Diagnosis not present

## 2011-04-25 ENCOUNTER — Encounter (HOSPITAL_COMMUNITY)
Admission: RE | Admit: 2011-04-25 | Discharge: 2011-04-25 | Disposition: A | Discharge status: Home / Self Care | Payer: Self-pay | Source: Ambulatory Visit | Attending: Cardiology | Admitting: Cardiology

## 2011-04-26 ENCOUNTER — Encounter: Payer: Self-pay | Admitting: Internal Medicine

## 2011-04-26 ENCOUNTER — Ambulatory Visit (INDEPENDENT_AMBULATORY_CARE_PROVIDER_SITE_OTHER): Payer: Medicare Other | Admitting: *Deleted

## 2011-04-26 ENCOUNTER — Encounter (HOSPITAL_COMMUNITY)
Admission: RE | Admit: 2011-04-26 | Discharge: 2011-04-26 | Disposition: A | Discharge status: Home / Self Care | Payer: Self-pay | Source: Ambulatory Visit | Attending: Cardiology | Admitting: Cardiology

## 2011-04-26 DIAGNOSIS — I428 Other cardiomyopathies: Secondary | ICD-10-CM

## 2011-04-27 ENCOUNTER — Encounter (HOSPITAL_COMMUNITY)
Admission: RE | Admit: 2011-04-27 | Discharge: 2011-04-27 | Disposition: A | Discharge status: Home / Self Care | Payer: Self-pay | Source: Ambulatory Visit | Attending: Cardiology | Admitting: Cardiology

## 2011-04-28 LAB — REMOTE ICD DEVICE
BATTERY VOLTAGE: 2.82 V
CHARGE TIME: 8.05 s
RV LEAD AMPLITUDE: 15.1 mv
TZAT-0001SLOWVT: 1
TZAT-0001SLOWVT: 2
TZAT-0004FASTVT: 8
TZAT-0005SLOWVT: 84 pct
TZAT-0005SLOWVT: 91 pct
TZAT-0018SLOWVT: NEGATIVE
TZAT-0018SLOWVT: NEGATIVE
TZAT-0019FASTVT: 8 V
TZAT-0019SLOWVT: 8 V
TZAT-0019SLOWVT: 8 V
TZAT-0020FASTVT: 1.6 ms
TZON-0005SLOWVT: 12
TZON-0008FASTVT: 0 ms
TZST-0001FASTVT: 2
TZST-0001FASTVT: 4
TZST-0001FASTVT: 6
TZST-0001SLOWVT: 3
TZST-0003FASTVT: 22 J
TZST-0003FASTVT: 35 J
TZST-0003FASTVT: 35 J
TZST-0003FASTVT: 35 J
TZST-0003SLOWVT: 35 J

## 2011-05-01 ENCOUNTER — Ambulatory Visit (INDEPENDENT_AMBULATORY_CARE_PROVIDER_SITE_OTHER): Payer: Self-pay | Admitting: Cardiology

## 2011-05-01 DIAGNOSIS — I4891 Unspecified atrial fibrillation: Secondary | ICD-10-CM

## 2011-05-01 DIAGNOSIS — Z7901 Long term (current) use of anticoagulants: Secondary | ICD-10-CM

## 2011-05-01 DIAGNOSIS — R0989 Other specified symptoms and signs involving the circulatory and respiratory systems: Secondary | ICD-10-CM

## 2011-05-02 ENCOUNTER — Encounter (HOSPITAL_COMMUNITY)
Admission: RE | Admit: 2011-05-02 | Discharge: 2011-05-02 | Disposition: A | Discharge status: Home / Self Care | Payer: Self-pay | Source: Ambulatory Visit | Attending: Cardiology | Admitting: Cardiology

## 2011-05-03 ENCOUNTER — Encounter (HOSPITAL_COMMUNITY)
Admission: RE | Admit: 2011-05-03 | Discharge: 2011-05-03 | Disposition: A | Discharge status: Home / Self Care | Payer: Self-pay | Source: Ambulatory Visit | Attending: Cardiology | Admitting: Cardiology

## 2011-05-04 ENCOUNTER — Encounter (HOSPITAL_COMMUNITY)
Admission: RE | Admit: 2011-05-04 | Discharge: 2011-05-04 | Disposition: A | Discharge status: Home / Self Care | Payer: Self-pay | Source: Ambulatory Visit | Attending: Cardiology | Admitting: Cardiology

## 2011-05-05 NOTE — Progress Notes (Signed)
PPM remote 

## 2011-05-09 ENCOUNTER — Encounter (HOSPITAL_COMMUNITY)
Admission: RE | Admit: 2011-05-09 | Discharge: 2011-05-09 | Disposition: A | Discharge status: Home / Self Care | Payer: Self-pay | Source: Ambulatory Visit | Attending: Cardiology | Admitting: Cardiology

## 2011-05-10 ENCOUNTER — Encounter: Payer: Self-pay | Admitting: *Deleted

## 2011-05-10 ENCOUNTER — Encounter (HOSPITAL_COMMUNITY)
Admission: RE | Admit: 2011-05-10 | Discharge: 2011-05-10 | Disposition: A | Discharge status: Home / Self Care | Payer: Self-pay | Source: Ambulatory Visit | Attending: Cardiology | Admitting: Cardiology

## 2011-05-11 ENCOUNTER — Encounter (HOSPITAL_COMMUNITY)
Admission: RE | Admit: 2011-05-11 | Discharge: 2011-05-11 | Disposition: A | Discharge status: Home / Self Care | Payer: Self-pay | Source: Ambulatory Visit | Attending: Cardiology | Admitting: Cardiology

## 2011-05-12 ENCOUNTER — Telehealth: Payer: Self-pay | Admitting: Nurse Practitioner

## 2011-05-12 MED ORDER — SILDENAFIL CITRATE 50 MG PO TABS: 50.0000 mg | ORAL_TABLET | Freq: Every day | ORAL | Status: DC | PRN

## 2011-05-12 NOTE — Telephone Encounter (Signed)
New refill [employer] is calling about rx for viagra 50 mg. Pt is going out of town and is requesting this med. He if a former [place] pt

## 2011-05-15 ENCOUNTER — Ambulatory Visit: Payer: Self-pay | Admitting: Cardiology

## 2011-05-15 DIAGNOSIS — I4891 Unspecified atrial fibrillation: Secondary | ICD-10-CM

## 2011-05-15 DIAGNOSIS — Z7901 Long term (current) use of anticoagulants: Secondary | ICD-10-CM

## 2011-05-16 ENCOUNTER — Encounter (HOSPITAL_COMMUNITY)
Admission: RE | Admit: 2011-05-16 | Discharge: 2011-05-16 | Disposition: A | Discharge status: Home / Self Care | Payer: Self-pay | Source: Ambulatory Visit | Attending: Cardiology | Admitting: Cardiology

## 2011-05-16 ENCOUNTER — Other Ambulatory Visit: Payer: Self-pay | Admitting: Cardiology

## 2011-05-16 DIAGNOSIS — I359 Nonrheumatic aortic valve disorder, unspecified: Secondary | ICD-10-CM | POA: Insufficient documentation

## 2011-05-16 DIAGNOSIS — Z951 Presence of aortocoronary bypass graft: Secondary | ICD-10-CM | POA: Insufficient documentation

## 2011-05-16 DIAGNOSIS — I4729 Other ventricular tachycardia: Secondary | ICD-10-CM | POA: Insufficient documentation

## 2011-05-16 DIAGNOSIS — I2589 Other forms of chronic ischemic heart disease: Secondary | ICD-10-CM | POA: Insufficient documentation

## 2011-05-16 DIAGNOSIS — I4891 Unspecified atrial fibrillation: Secondary | ICD-10-CM | POA: Insufficient documentation

## 2011-05-16 DIAGNOSIS — R0989 Other specified symptoms and signs involving the circulatory and respiratory systems: Secondary | ICD-10-CM | POA: Insufficient documentation

## 2011-05-16 DIAGNOSIS — Z95810 Presence of automatic (implantable) cardiac defibrillator: Secondary | ICD-10-CM | POA: Insufficient documentation

## 2011-05-16 DIAGNOSIS — I472 Ventricular tachycardia, unspecified: Secondary | ICD-10-CM | POA: Insufficient documentation

## 2011-05-16 DIAGNOSIS — Z5189 Encounter for other specified aftercare: Secondary | ICD-10-CM | POA: Insufficient documentation

## 2011-05-17 ENCOUNTER — Encounter (HOSPITAL_COMMUNITY)
Admission: RE | Admit: 2011-05-17 | Discharge: 2011-05-17 | Disposition: A | Discharge status: Home / Self Care | Payer: Self-pay | Source: Ambulatory Visit | Attending: Cardiology | Admitting: Cardiology

## 2011-05-17 ENCOUNTER — Other Ambulatory Visit: Payer: Self-pay | Admitting: *Deleted

## 2011-05-17 ENCOUNTER — Other Ambulatory Visit: Payer: Self-pay

## 2011-05-17 MED ORDER — NITROGLYCERIN 0.4 MG SL SUBL: 0.4000 mg | SUBLINGUAL_TABLET | SUBLINGUAL | Status: DC | PRN

## 2011-05-18 ENCOUNTER — Encounter (HOSPITAL_COMMUNITY)
Admission: RE | Admit: 2011-05-18 | Discharge: 2011-05-18 | Disposition: A | Discharge status: Home / Self Care | Payer: Self-pay | Source: Ambulatory Visit | Attending: Cardiology | Admitting: Cardiology

## 2011-05-19 DIAGNOSIS — K5900 Constipation, unspecified: Secondary | ICD-10-CM | POA: Diagnosis not present

## 2011-05-23 ENCOUNTER — Encounter (HOSPITAL_COMMUNITY): Payer: Self-pay

## 2011-05-24 ENCOUNTER — Encounter (HOSPITAL_COMMUNITY): Payer: Self-pay

## 2011-05-25 ENCOUNTER — Encounter (HOSPITAL_COMMUNITY): Payer: Self-pay

## 2011-05-29 ENCOUNTER — Ambulatory Visit: Payer: Self-pay | Admitting: Internal Medicine

## 2011-05-29 DIAGNOSIS — Z7901 Long term (current) use of anticoagulants: Secondary | ICD-10-CM

## 2011-05-29 DIAGNOSIS — I4891 Unspecified atrial fibrillation: Secondary | ICD-10-CM | POA: Diagnosis not present

## 2011-05-30 ENCOUNTER — Encounter (HOSPITAL_COMMUNITY): Payer: Self-pay

## 2011-05-31 ENCOUNTER — Encounter (HOSPITAL_COMMUNITY): Payer: Self-pay

## 2011-06-01 ENCOUNTER — Encounter (HOSPITAL_COMMUNITY): Payer: Self-pay

## 2011-06-06 ENCOUNTER — Encounter (HOSPITAL_COMMUNITY): Payer: Self-pay

## 2011-06-07 ENCOUNTER — Encounter (HOSPITAL_COMMUNITY): Payer: Self-pay

## 2011-06-08 ENCOUNTER — Encounter (HOSPITAL_COMMUNITY): Payer: Self-pay

## 2011-06-08 ENCOUNTER — Encounter: Payer: Self-pay | Admitting: Internal Medicine

## 2011-06-12 ENCOUNTER — Ambulatory Visit (INDEPENDENT_AMBULATORY_CARE_PROVIDER_SITE_OTHER): Payer: Medicare Other | Admitting: Cardiology

## 2011-06-12 DIAGNOSIS — E039 Hypothyroidism, unspecified: Secondary | ICD-10-CM | POA: Diagnosis not present

## 2011-06-12 DIAGNOSIS — Z79899 Other long term (current) drug therapy: Secondary | ICD-10-CM | POA: Diagnosis not present

## 2011-06-12 DIAGNOSIS — E785 Hyperlipidemia, unspecified: Secondary | ICD-10-CM | POA: Diagnosis not present

## 2011-06-12 DIAGNOSIS — R300 Dysuria: Secondary | ICD-10-CM | POA: Diagnosis not present

## 2011-06-12 DIAGNOSIS — I4891 Unspecified atrial fibrillation: Secondary | ICD-10-CM | POA: Diagnosis not present

## 2011-06-12 DIAGNOSIS — Z880 Allergy status to penicillin: Secondary | ICD-10-CM | POA: Diagnosis not present

## 2011-06-12 DIAGNOSIS — Z7901 Long term (current) use of anticoagulants: Secondary | ICD-10-CM | POA: Diagnosis not present

## 2011-06-12 DIAGNOSIS — Z8673 Personal history of transient ischemic attack (TIA), and cerebral infarction without residual deficits: Secondary | ICD-10-CM | POA: Diagnosis not present

## 2011-06-12 DIAGNOSIS — Z8546 Personal history of malignant neoplasm of prostate: Secondary | ICD-10-CM | POA: Diagnosis not present

## 2011-06-12 DIAGNOSIS — Z923 Personal history of irradiation: Secondary | ICD-10-CM | POA: Diagnosis not present

## 2011-06-12 DIAGNOSIS — I2510 Atherosclerotic heart disease of native coronary artery without angina pectoris: Secondary | ICD-10-CM | POA: Diagnosis not present

## 2011-06-12 DIAGNOSIS — Z888 Allergy status to other drugs, medicaments and biological substances status: Secondary | ICD-10-CM | POA: Diagnosis not present

## 2011-06-12 DIAGNOSIS — I252 Old myocardial infarction: Secondary | ICD-10-CM | POA: Diagnosis not present

## 2011-06-13 ENCOUNTER — Encounter (HOSPITAL_COMMUNITY): Payer: Self-pay

## 2011-06-13 DIAGNOSIS — I472 Ventricular tachycardia, unspecified: Secondary | ICD-10-CM | POA: Insufficient documentation

## 2011-06-13 DIAGNOSIS — R0989 Other specified symptoms and signs involving the circulatory and respiratory systems: Secondary | ICD-10-CM | POA: Insufficient documentation

## 2011-06-13 DIAGNOSIS — I4891 Unspecified atrial fibrillation: Secondary | ICD-10-CM | POA: Insufficient documentation

## 2011-06-13 DIAGNOSIS — Z5189 Encounter for other specified aftercare: Secondary | ICD-10-CM | POA: Insufficient documentation

## 2011-06-13 DIAGNOSIS — Z95810 Presence of automatic (implantable) cardiac defibrillator: Secondary | ICD-10-CM | POA: Insufficient documentation

## 2011-06-13 DIAGNOSIS — I359 Nonrheumatic aortic valve disorder, unspecified: Secondary | ICD-10-CM | POA: Insufficient documentation

## 2011-06-13 DIAGNOSIS — I2589 Other forms of chronic ischemic heart disease: Secondary | ICD-10-CM | POA: Insufficient documentation

## 2011-06-13 DIAGNOSIS — Z951 Presence of aortocoronary bypass graft: Secondary | ICD-10-CM | POA: Insufficient documentation

## 2011-06-13 DIAGNOSIS — I4729 Other ventricular tachycardia: Secondary | ICD-10-CM | POA: Insufficient documentation

## 2011-06-14 ENCOUNTER — Encounter (HOSPITAL_COMMUNITY): Payer: Self-pay

## 2011-06-15 ENCOUNTER — Encounter (HOSPITAL_COMMUNITY): Payer: Self-pay

## 2011-06-16 ENCOUNTER — Ambulatory Visit: Payer: Self-pay | Admitting: Cardiology

## 2011-06-16 DIAGNOSIS — I4891 Unspecified atrial fibrillation: Secondary | ICD-10-CM

## 2011-06-16 DIAGNOSIS — Z7901 Long term (current) use of anticoagulants: Secondary | ICD-10-CM

## 2011-06-20 ENCOUNTER — Encounter (HOSPITAL_COMMUNITY): Payer: Self-pay

## 2011-06-21 ENCOUNTER — Encounter (HOSPITAL_COMMUNITY): Payer: Self-pay

## 2011-06-22 ENCOUNTER — Encounter (HOSPITAL_COMMUNITY): Payer: Self-pay

## 2011-06-26 ENCOUNTER — Ambulatory Visit: Payer: Self-pay | Admitting: Cardiology

## 2011-06-26 DIAGNOSIS — I4891 Unspecified atrial fibrillation: Secondary | ICD-10-CM

## 2011-06-26 DIAGNOSIS — Z7901 Long term (current) use of anticoagulants: Secondary | ICD-10-CM

## 2011-06-26 LAB — POCT INR: INR: 2.2

## 2011-06-27 ENCOUNTER — Encounter (HOSPITAL_COMMUNITY): Payer: Self-pay

## 2011-06-28 ENCOUNTER — Encounter (HOSPITAL_COMMUNITY): Payer: Self-pay

## 2011-06-29 ENCOUNTER — Encounter (HOSPITAL_COMMUNITY): Payer: Self-pay

## 2011-07-04 ENCOUNTER — Encounter (HOSPITAL_COMMUNITY): Payer: Self-pay

## 2011-07-05 ENCOUNTER — Encounter (HOSPITAL_COMMUNITY): Payer: Self-pay

## 2011-07-06 ENCOUNTER — Encounter (HOSPITAL_COMMUNITY): Payer: Self-pay

## 2011-07-10 ENCOUNTER — Encounter: Payer: Self-pay | Admitting: Nurse Practitioner

## 2011-07-10 ENCOUNTER — Ambulatory Visit: Payer: Self-pay | Admitting: Cardiology

## 2011-07-10 ENCOUNTER — Ambulatory Visit (INDEPENDENT_AMBULATORY_CARE_PROVIDER_SITE_OTHER): Payer: Medicare Other | Admitting: Nurse Practitioner

## 2011-07-10 VITALS — BP 128/68 | HR 72 | Ht 75.0 in | Wt 213.0 lb

## 2011-07-10 DIAGNOSIS — E039 Hypothyroidism, unspecified: Secondary | ICD-10-CM

## 2011-07-10 DIAGNOSIS — I4891 Unspecified atrial fibrillation: Secondary | ICD-10-CM | POA: Diagnosis not present

## 2011-07-10 DIAGNOSIS — Z7901 Long term (current) use of anticoagulants: Secondary | ICD-10-CM | POA: Diagnosis not present

## 2011-07-10 DIAGNOSIS — Z954 Presence of other heart-valve replacement: Secondary | ICD-10-CM

## 2011-07-10 DIAGNOSIS — I255 Ischemic cardiomyopathy: Secondary | ICD-10-CM

## 2011-07-10 DIAGNOSIS — E785 Hyperlipidemia, unspecified: Secondary | ICD-10-CM

## 2011-07-10 DIAGNOSIS — I2589 Other forms of chronic ischemic heart disease: Secondary | ICD-10-CM

## 2011-07-10 DIAGNOSIS — Z952 Presence of prosthetic heart valve: Secondary | ICD-10-CM

## 2011-07-10 NOTE — Assessment & Plan Note (Signed)
EF is 31%. He is on a good medical regimen that he tolerates. Will see him back in about 4 months. We will arrange for fasting labs later this week.

## 2011-07-10 NOTE — Progress Notes (Signed)
[name] Date of Birth: 1932-10-05 Medical Record #[id number]  History of Present Illness: [name] is seen today for a 3 month check. He is seen for Dr. Riley Kill. He has a complex medical history which includes CAD, prior CABG, severe LV dysfunction with ICD in place, chronic atrial fib, coumadin anticoagulation and prior TAVI in March of 2012 at the [facility]. He has opted for rate control with his atrial fib.   He comes in today. He is doing quite well. Has just returned from spending 7 weeks in [place]. Had a good time. The only "bump in the road" was a prostate infection. He was given antibiotics and he is going to follow up with Dr. Earlene Plater. No chest pain. Not short of breath. Energy is good. Still has some balance issues and is going to discuss with the rehab folks tomorrow. Still with occasional "darts" in his peripheral vision. Says this has been going on since his valve procedure.  Says this improved about 75% with cutting back on his dose of Digoxin. His coumadin checks have been good. He checks it every 3 days. No other neurologic findings reported. Very little cough reported. Overall, he is pretty satisfied with how he is doing.  Current Outpatient Prescriptions on File Prior to Visit  Medication Sig Dispense Refill  . alfuzosin (UROXATRAL) 10 MG 24 hr tablet Take 10 mg by mouth daily.        Marland Kitchen aspirin 81 MG chewable tablet Chew 81 mg by mouth daily.       . carvedilol (COREG) 12.5 MG tablet Take 1 tablet (12.5 mg total) by mouth 2 (two) times daily.  60 tablet  6  . digoxin (LANOXIN) 0.125 MG tablet Take only a half of a tablet daily  30 tablet  11  . furosemide (LASIX) 20 MG tablet Take 20 mg by mouth every 3 (three) days.        Marland Kitchen LIPITOR 10 MG tablet TAKE 1 TABLET ONCE DAILY.  30 each  6  . Multiple Vitamin (MULTIVITAMIN) tablet Take 1 tablet by mouth daily.        . nitroGLYCERIN (NITROSTAT) 0.4 MG SL tablet Place 1 tablet (0.4 mg total) under the tongue every 5 (five)  minutes as needed for chest pain.  25 tablet  6  . omeprazole (PRILOSEC OTC) 20 MG tablet Take 1 tablet (20 mg total) by mouth daily. For acid reflux      . psyllium (METAMUCIL) 58.6 % powder Take 1 packet by mouth as needed. For constipation      . SYNTHROID 150 MCG tablet TAKE 1 TABLET ONCE DAILY.  60 each  4  . warfarin (COUMADIN) 5 MG tablet Take 2.5-5 mg by mouth daily. Monday only- 0.5 half tab (2.5mg ); All other days 1 tab (5mg )       . sildenafil (VIAGRA) 50 MG tablet Take 1 tablet (50 mg total) by mouth daily as needed for erectile dysfunction.  10 tablet  2    Allergies  Allergen Reactions  . Antihistamines, Diphenhydramine-Type Other (See Comments)    unknown  . Penicillins Other (See Comments)    Couldn't breathe.    Past Medical History  Diagnosis Date  . Ischemic cardiomyopathy 05/2010    Has EF of 25%  . Diverticulitis     CURRENTLY CONTROLLED WITH NO EVIDENCE OF RECURRENT INFECTION  . AF (atrial fibrillation)     Has not tolerated amiodarone in the past. Amiodarone was stopped in September of 2010  due to side effects  . Chronic anticoagulation     on coumadin  . ICD (implantable cardiac defibrillator) in place   . Prostate cancer   . Osteoarthritis     RIGHT KNEE  . Aortic stenosis, severe     WITH PERCUTANEOUS AORTIC VALVE (TAVI) IN March 2012 at the [facility]  . MI (myocardial infarction) 1974, 72  . Cervical myelopathy   . Pulmonary embolism   . Hyperlipidemia   . GERD (gastroesophageal reflux disease)   . Esophageal stricture     WITH DILATATION  . NSVT (nonsustained ventricular tachycardia)     Past Surgical History  Procedure Date  . Icd Feb 2003  . Coronary artery bypass graft 1979  . Coronary artery bypass graft 1991    REDO SURGERY  . Cardiac catheterization 2010    SEVERE LV DYSFUNCTION WITH ESTIMATED EJECTION FRACTION OF 25%  . Cholecystectomy   . Aortic valve replacement     Percutaneous AVR in March 2012 at the [facility]   . Cardioversion 02/16/2011    Procedure: CARDIOVERSION;  Surgeon: Luis Abed, MD;  Location: [facility] OR;  Service: Cardiovascular;  Laterality: N/A;    History  Smoking status  . Former Smoker -- 4.0 packs/day for 15 years  . Types: Cigarettes  . Quit date: 06/15/1969  Smokeless tobacco  . Never Used    History  Alcohol Use No    Family History  Problem Relation Age of Onset  . Heart disease Mother 46  . Diabetes Mother 64  . Heart disease Father 70    Review of Systems: The review of systems is per the HPI.  All other systems were reviewed and are negative.  Physical Exam: BP 128/68  Pulse 72  Ht 6\' 3"  (1.905 m)  Wt 213 lb (96.616 kg)  BMI 26.62 kg/m2  SpO2 98% Patient is very pleasant and in no acute distress. He looks good. Skin is warm and dry. Color is normal.  HEENT is unremarkable. Normocephalic/atraumatic. PERRL. Sclera are nonicteric. Neck is supple. No masses. No JVD. Lungs are clear. Cardiac exam shows an irregular rhythm. Rate is controlled. Systolic murmur noted. Abdomen is soft. Extremities are without edema. Gait and ROM are intact. No gross neurologic deficits noted.  LABORATORY DATA:   Assessment / Plan:

## 2011-07-10 NOTE — Assessment & Plan Note (Signed)
He remains in atrial fib. He is basically asymptomatic and happy with how he is doing/feeling. No change with his current regimen.

## 2011-07-10 NOTE — Patient Instructions (Signed)
We will check fasting labs later this week.  Stay active  We will see you back in 4 months.  Call the [facility] office at [phone] if you have any questions, problems or concerns.

## 2011-07-10 NOTE — Assessment & Plan Note (Signed)
He is now one year out from his TAVI. Doing quite well. Still with some visual issues which I wonder if it is related to that procedure. We will check a Digoxin level later this week with his fasting labs. I do not think he needs CT scanning at this time. Has no other symptoms. Had his carotids checked a year ago and was reportedly ok. Will ask for Dr. Rosalyn Charters opinion as well. Tentatively, we will see him back in 4 months. Patient is agreeable to this plan and will call if any problems develop in the interim.

## 2011-07-11 ENCOUNTER — Other Ambulatory Visit (INDEPENDENT_AMBULATORY_CARE_PROVIDER_SITE_OTHER): Payer: Medicare Other

## 2011-07-11 ENCOUNTER — Encounter (HOSPITAL_COMMUNITY)
Admission: RE | Admit: 2011-07-11 | Discharge: 2011-07-11 | Disposition: A | Discharge status: Home / Self Care | Payer: Self-pay | Source: Ambulatory Visit | Attending: Cardiology | Admitting: Cardiology

## 2011-07-11 DIAGNOSIS — Z79899 Other long term (current) drug therapy: Secondary | ICD-10-CM | POA: Diagnosis not present

## 2011-07-11 DIAGNOSIS — E039 Hypothyroidism, unspecified: Secondary | ICD-10-CM

## 2011-07-11 DIAGNOSIS — E785 Hyperlipidemia, unspecified: Secondary | ICD-10-CM | POA: Diagnosis not present

## 2011-07-11 LAB — HEPATIC FUNCTION PANEL
ALT: 16 U/L (ref 0–53)
AST: 19 U/L (ref 0–37)
Albumin: 3.9 g/dL (ref 3.5–5.2)
Alkaline Phosphatase: 79 U/L (ref 39–117)
Bilirubin, Direct: 0.1 mg/dL (ref 0.0–0.3)
Total Bilirubin: 1 mg/dL (ref 0.3–1.2)
Total Protein: 6.6 g/dL (ref 6.0–8.3)

## 2011-07-11 LAB — LIPID PANEL
Cholesterol: 119 mg/dL (ref 0–200)
HDL: 46.2 mg/dL (ref >39.00)
LDL Cholesterol: 59 mg/dL (ref 0–99)
Total CHOL/HDL Ratio: 3
Triglycerides: 69 mg/dL (ref 0.0–149.0)
VLDL: 13.8 mg/dL (ref 0.0–40.0)

## 2011-07-11 LAB — BASIC METABOLIC PANEL
BUN: 10 mg/dL (ref 6–23)
CO2: 29 mEq/L (ref 19–32)
Calcium: 8.7 mg/dL (ref 8.4–10.5)
Chloride: 102 mEq/L (ref 96–112)
Creatinine, Ser: 1 mg/dL (ref 0.4–1.5)
GFR: 75.22 mL/min (ref >60.00)
Glucose, Bld: 92 mg/dL (ref 70–99)
Potassium: 4.3 mEq/L (ref 3.5–5.1)
Sodium: 137 mEq/L (ref 135–145)

## 2011-07-11 LAB — CBC WITH DIFFERENTIAL/PLATELET
Basophils Absolute: 0 10*3/uL (ref 0.0–0.1)
Basophils Relative: 0.6 % (ref 0.0–3.0)
Eosinophils Absolute: 0.4 10*3/uL (ref 0.0–0.7)
Eosinophils Relative: 6 % — ABNORMAL HIGH (ref 0.0–5.0)
HCT: 41 % (ref 39.0–52.0)
Hemoglobin: 13.6 g/dL (ref 13.0–17.0)
Lymphocytes Relative: 14.7 % (ref 12.0–46.0)
Lymphs Abs: 0.9 10*3/uL (ref 0.7–4.0)
MCHC: 33.2 g/dL (ref 30.0–36.0)
MCV: 91.3 fl (ref 78.0–100.0)
Monocytes Absolute: 0.5 10*3/uL (ref 0.1–1.0)
Monocytes Relative: 8.3 % (ref 3.0–12.0)
Neutro Abs: 4.4 10*3/uL (ref 1.4–7.7)
Neutrophils Relative %: 70.4 % (ref 43.0–77.0)
Platelets: 165 10*3/uL (ref 150.0–400.0)
RBC: 4.49 Mil/uL (ref 4.22–5.81)
RDW: 14.4 % (ref 11.5–14.6)
WBC: 6.3 10*3/uL (ref 4.5–10.5)

## 2011-07-11 LAB — TSH: TSH: 0.57 u[IU]/mL (ref 0.35–5.50)

## 2011-07-12 ENCOUNTER — Encounter (HOSPITAL_COMMUNITY)
Admission: RE | Admit: 2011-07-12 | Discharge: 2011-07-12 | Disposition: A | Discharge status: Home / Self Care | Payer: Self-pay | Source: Ambulatory Visit | Attending: Cardiology | Admitting: Cardiology

## 2011-07-12 DIAGNOSIS — Z95810 Presence of automatic (implantable) cardiac defibrillator: Secondary | ICD-10-CM | POA: Insufficient documentation

## 2011-07-12 DIAGNOSIS — I472 Ventricular tachycardia, unspecified: Secondary | ICD-10-CM | POA: Insufficient documentation

## 2011-07-12 DIAGNOSIS — Z5189 Encounter for other specified aftercare: Secondary | ICD-10-CM | POA: Insufficient documentation

## 2011-07-12 DIAGNOSIS — R0989 Other specified symptoms and signs involving the circulatory and respiratory systems: Secondary | ICD-10-CM | POA: Insufficient documentation

## 2011-07-12 DIAGNOSIS — Z951 Presence of aortocoronary bypass graft: Secondary | ICD-10-CM | POA: Insufficient documentation

## 2011-07-12 DIAGNOSIS — I359 Nonrheumatic aortic valve disorder, unspecified: Secondary | ICD-10-CM | POA: Insufficient documentation

## 2011-07-12 DIAGNOSIS — I4729 Other ventricular tachycardia: Secondary | ICD-10-CM | POA: Insufficient documentation

## 2011-07-12 DIAGNOSIS — I4891 Unspecified atrial fibrillation: Secondary | ICD-10-CM | POA: Insufficient documentation

## 2011-07-12 DIAGNOSIS — I2589 Other forms of chronic ischemic heart disease: Secondary | ICD-10-CM | POA: Insufficient documentation

## 2011-07-12 LAB — DIGOXIN LEVEL: Digoxin Level: 0.3 ng/mL — ABNORMAL LOW (ref 0.8–2.0)

## 2011-07-13 ENCOUNTER — Encounter (HOSPITAL_COMMUNITY)
Admission: RE | Admit: 2011-07-13 | Discharge: 2011-07-13 | Disposition: A | Discharge status: Home / Self Care | Payer: Self-pay | Source: Ambulatory Visit | Attending: Cardiology | Admitting: Cardiology

## 2011-07-18 ENCOUNTER — Encounter (HOSPITAL_COMMUNITY): Payer: Self-pay

## 2011-07-19 ENCOUNTER — Encounter (HOSPITAL_COMMUNITY): Payer: Self-pay

## 2011-07-20 ENCOUNTER — Encounter (HOSPITAL_COMMUNITY): Payer: Self-pay

## 2011-07-24 ENCOUNTER — Ambulatory Visit: Payer: Self-pay | Admitting: Cardiology

## 2011-07-24 DIAGNOSIS — Z7901 Long term (current) use of anticoagulants: Secondary | ICD-10-CM

## 2011-07-24 DIAGNOSIS — I4891 Unspecified atrial fibrillation: Secondary | ICD-10-CM

## 2011-07-25 ENCOUNTER — Encounter (HOSPITAL_COMMUNITY): Payer: Self-pay

## 2011-07-26 ENCOUNTER — Encounter (HOSPITAL_COMMUNITY): Payer: Self-pay

## 2011-07-27 ENCOUNTER — Ambulatory Visit (INDEPENDENT_AMBULATORY_CARE_PROVIDER_SITE_OTHER): Payer: Medicare Other | Admitting: *Deleted

## 2011-07-27 ENCOUNTER — Encounter: Payer: Self-pay | Admitting: Internal Medicine

## 2011-07-27 ENCOUNTER — Encounter (HOSPITAL_COMMUNITY): Payer: Self-pay

## 2011-07-27 DIAGNOSIS — I2589 Other forms of chronic ischemic heart disease: Secondary | ICD-10-CM | POA: Diagnosis not present

## 2011-07-27 DIAGNOSIS — Z95810 Presence of automatic (implantable) cardiac defibrillator: Secondary | ICD-10-CM | POA: Diagnosis not present

## 2011-07-27 DIAGNOSIS — I255 Ischemic cardiomyopathy: Secondary | ICD-10-CM

## 2011-08-01 ENCOUNTER — Encounter (HOSPITAL_COMMUNITY)
Admission: RE | Admit: 2011-08-01 | Discharge: 2011-08-01 | Disposition: A | Discharge status: Home / Self Care | Payer: Self-pay | Source: Ambulatory Visit | Attending: Cardiology | Admitting: Cardiology

## 2011-08-01 DIAGNOSIS — K5900 Constipation, unspecified: Secondary | ICD-10-CM | POA: Diagnosis not present

## 2011-08-02 ENCOUNTER — Encounter (HOSPITAL_COMMUNITY)
Admission: RE | Admit: 2011-08-02 | Discharge: 2011-08-02 | Disposition: A | Discharge status: Home / Self Care | Payer: Self-pay | Source: Ambulatory Visit | Attending: Cardiology | Admitting: Cardiology

## 2011-08-03 ENCOUNTER — Encounter (HOSPITAL_COMMUNITY)
Admission: RE | Admit: 2011-08-03 | Discharge: 2011-08-03 | Disposition: A | Discharge status: Home / Self Care | Payer: Self-pay | Source: Ambulatory Visit | Attending: Cardiology | Admitting: Cardiology

## 2011-08-03 DIAGNOSIS — H905 Unspecified sensorineural hearing loss: Secondary | ICD-10-CM | POA: Diagnosis not present

## 2011-08-03 DIAGNOSIS — IMO0002 Reserved for concepts with insufficient information to code with codable children: Secondary | ICD-10-CM | POA: Diagnosis not present

## 2011-08-03 LAB — REMOTE ICD DEVICE
BRDY-0002RV: 40 {beats}/min
DEV-0020ICD: NEGATIVE
RV LEAD AMPLITUDE: 15.9 mv
RV LEAD IMPEDENCE ICD: 632 Ohm
TOT-0006: 20130213000000
TZAT-0001FASTVT: 1
TZAT-0001SLOWVT: 1
TZAT-0001SLOWVT: 2
TZAT-0004SLOWVT: 6
TZAT-0004SLOWVT: 8
TZAT-0013SLOWVT: 2
TZAT-0013SLOWVT: 2
TZAT-0018FASTVT: NEGATIVE
TZAT-0018SLOWVT: NEGATIVE
TZAT-0019FASTVT: 8 V
TZAT-0020SLOWVT: 1.6 ms
TZAT-0020SLOWVT: 1.6 ms
TZON-0004SLOWVT: 16
TZON-0008FASTVT: 0 ms
TZST-0001FASTVT: 2
TZST-0001FASTVT: 4
TZST-0001FASTVT: 6
TZST-0001SLOWVT: 3
TZST-0001SLOWVT: 5
TZST-0003FASTVT: 22 J
TZST-0003FASTVT: 35 J
TZST-0003FASTVT: 35 J
TZST-0003SLOWVT: 22 J

## 2011-08-04 ENCOUNTER — Other Ambulatory Visit: Payer: Self-pay | Admitting: *Deleted

## 2011-08-04 MED ORDER — WARFARIN SODIUM 5 MG PO TABS: ORAL_TABLET | ORAL | Status: DC

## 2011-08-08 ENCOUNTER — Encounter (HOSPITAL_COMMUNITY)
Admission: RE | Admit: 2011-08-08 | Discharge: 2011-08-08 | Disposition: A | Discharge status: Home / Self Care | Payer: Self-pay | Source: Ambulatory Visit | Attending: Cardiology | Admitting: Cardiology

## 2011-08-08 NOTE — Progress Notes (Signed)
Patient c/o feeling dizzy after getting his blood pressure checked after cool-down today. Patient's BP prior to becoming symptomatic was 104/64. Pt was assisted to a chair and recheck BP was 94/64. Pt had a cup of water and a cup of Gatorade and rested with legs elevated until dizziness subsided. Recheck BP was 120/64, heart rate 64 bpm. Pt did say he took his diuretic today which he takes every third day and noted a previous episode of dizziness he experienced was on a day he took his diuretic. Pt was asymptomatic at exit, and his wife, who also exercises in the program, was present throughout. Pt was encouraged to contact his physician if he becomes symptomatic again.

## 2011-08-09 ENCOUNTER — Ambulatory Visit: Payer: Self-pay | Admitting: Cardiology

## 2011-08-09 ENCOUNTER — Encounter (HOSPITAL_COMMUNITY)
Admission: RE | Admit: 2011-08-09 | Discharge: 2011-08-09 | Disposition: A | Discharge status: Home / Self Care | Payer: Self-pay | Source: Ambulatory Visit | Attending: Cardiology | Admitting: Cardiology

## 2011-08-09 DIAGNOSIS — Z7901 Long term (current) use of anticoagulants: Secondary | ICD-10-CM

## 2011-08-09 DIAGNOSIS — I4891 Unspecified atrial fibrillation: Secondary | ICD-10-CM

## 2011-08-10 ENCOUNTER — Encounter (HOSPITAL_COMMUNITY)
Admission: RE | Admit: 2011-08-10 | Discharge: 2011-08-10 | Disposition: A | Discharge status: Home / Self Care | Payer: Self-pay | Source: Ambulatory Visit | Attending: Cardiology | Admitting: Cardiology

## 2011-08-14 ENCOUNTER — Encounter: Payer: Self-pay | Admitting: *Deleted

## 2011-08-15 ENCOUNTER — Encounter (HOSPITAL_COMMUNITY)
Admission: RE | Admit: 2011-08-15 | Discharge: 2011-08-15 | Disposition: A | Discharge status: Home / Self Care | Payer: Self-pay | Source: Ambulatory Visit | Attending: Cardiology | Admitting: Cardiology

## 2011-08-15 DIAGNOSIS — R0989 Other specified symptoms and signs involving the circulatory and respiratory systems: Secondary | ICD-10-CM | POA: Insufficient documentation

## 2011-08-15 DIAGNOSIS — I472 Ventricular tachycardia, unspecified: Secondary | ICD-10-CM | POA: Insufficient documentation

## 2011-08-15 DIAGNOSIS — Z951 Presence of aortocoronary bypass graft: Secondary | ICD-10-CM | POA: Insufficient documentation

## 2011-08-15 DIAGNOSIS — I4729 Other ventricular tachycardia: Secondary | ICD-10-CM | POA: Insufficient documentation

## 2011-08-15 DIAGNOSIS — I4891 Unspecified atrial fibrillation: Secondary | ICD-10-CM | POA: Insufficient documentation

## 2011-08-15 DIAGNOSIS — Z95810 Presence of automatic (implantable) cardiac defibrillator: Secondary | ICD-10-CM | POA: Insufficient documentation

## 2011-08-15 DIAGNOSIS — Z5189 Encounter for other specified aftercare: Secondary | ICD-10-CM | POA: Insufficient documentation

## 2011-08-15 DIAGNOSIS — I359 Nonrheumatic aortic valve disorder, unspecified: Secondary | ICD-10-CM | POA: Insufficient documentation

## 2011-08-15 DIAGNOSIS — I2589 Other forms of chronic ischemic heart disease: Secondary | ICD-10-CM | POA: Insufficient documentation

## 2011-08-16 ENCOUNTER — Other Ambulatory Visit: Payer: Self-pay | Admitting: Cardiology

## 2011-08-16 ENCOUNTER — Telehealth: Payer: Self-pay

## 2011-08-16 ENCOUNTER — Encounter (HOSPITAL_COMMUNITY)
Admission: RE | Admit: 2011-08-16 | Discharge: 2011-08-16 | Disposition: A | Discharge status: Home / Self Care | Payer: Self-pay | Source: Ambulatory Visit | Attending: Cardiology | Admitting: Cardiology

## 2011-08-16 ENCOUNTER — Other Ambulatory Visit: Payer: Self-pay | Admitting: *Deleted

## 2011-08-16 NOTE — Progress Notes (Signed)
Pt c/o feeling dizzy pre-exercise, BP was 90/72. Gatorade given, recheck BP  seated was 112/62, standing 92/62, HR 58 bpm. Pt felt dizzy going from seated to standing. Spoke with Leotis Shames, Dr Rosalyn Charters nurse regarding pt's symptoms. Pt is to contact the office if symptoms recur. Exit BP was 94/62, HR 64 bpm. Pt was asymptomatic at exit.

## 2011-08-16 NOTE — Telephone Encounter (Signed)
rx request. Does Dr Riley Kill refill her clindamycin?   Kellie Chisolm CMA

## 2011-08-16 NOTE — Telephone Encounter (Signed)
Olinty called from Cardiac Rehab and said that the pt complained of dizziness today at rehab.  His BP was 90/72, pulse 54-58.  They did have the pt drink fluids and his BP went up to 112/62.  Olinty then had the pt stand up and his BP dropped to 92/62.  The pt takes Furosemide 20mg  every 3rd day and he took this dose yesterday.  The pt does notice that he has these symptoms the day after his furosemide dose.  At this time the pt will push fluids and monitor his BP at home.  If his BP is still running low he will hold his furosemide dose on Friday and contact the office.  If the pt takes his Furosemide on Friday and develops symptoms again he will contact the office for further instructions.

## 2011-08-16 NOTE — Telephone Encounter (Signed)
Yes this is for [facility]BE.

## 2011-08-17 ENCOUNTER — Encounter (HOSPITAL_COMMUNITY): Payer: Self-pay

## 2011-08-21 ENCOUNTER — Other Ambulatory Visit: Payer: Self-pay | Admitting: Cardiology

## 2011-08-21 ENCOUNTER — Ambulatory Visit: Payer: Self-pay | Admitting: Cardiology

## 2011-08-21 DIAGNOSIS — I4891 Unspecified atrial fibrillation: Secondary | ICD-10-CM

## 2011-08-21 DIAGNOSIS — Z7901 Long term (current) use of anticoagulants: Secondary | ICD-10-CM | POA: Diagnosis not present

## 2011-08-22 ENCOUNTER — Encounter (HOSPITAL_COMMUNITY)
Admission: RE | Admit: 2011-08-22 | Discharge: 2011-08-22 | Disposition: A | Discharge status: Home / Self Care | Payer: Self-pay | Source: Ambulatory Visit | Attending: Cardiology | Admitting: Cardiology

## 2011-08-23 ENCOUNTER — Encounter (HOSPITAL_COMMUNITY)
Admission: RE | Admit: 2011-08-23 | Discharge: 2011-08-23 | Disposition: A | Discharge status: Home / Self Care | Payer: Self-pay | Source: Ambulatory Visit | Attending: Cardiology | Admitting: Cardiology

## 2011-08-24 ENCOUNTER — Encounter (HOSPITAL_COMMUNITY)
Admission: RE | Admit: 2011-08-24 | Discharge: 2011-08-24 | Disposition: A | Discharge status: Home / Self Care | Payer: Self-pay | Source: Ambulatory Visit | Attending: Cardiology | Admitting: Cardiology

## 2011-08-29 ENCOUNTER — Encounter (HOSPITAL_COMMUNITY)
Admission: RE | Admit: 2011-08-29 | Discharge: 2011-08-29 | Disposition: A | Discharge status: Home / Self Care | Payer: Self-pay | Source: Ambulatory Visit | Attending: Cardiology | Admitting: Cardiology

## 2011-08-30 ENCOUNTER — Encounter (HOSPITAL_COMMUNITY): Payer: Self-pay

## 2011-08-31 ENCOUNTER — Encounter (HOSPITAL_COMMUNITY)
Admission: RE | Admit: 2011-08-31 | Discharge: 2011-08-31 | Disposition: A | Discharge status: Home / Self Care | Payer: Self-pay | Source: Ambulatory Visit | Attending: Cardiology | Admitting: Cardiology

## 2011-09-04 ENCOUNTER — Ambulatory Visit (INDEPENDENT_AMBULATORY_CARE_PROVIDER_SITE_OTHER): Payer: Medicare Other | Admitting: Cardiovascular Disease

## 2011-09-04 DIAGNOSIS — I4891 Unspecified atrial fibrillation: Secondary | ICD-10-CM

## 2011-09-04 DIAGNOSIS — Z7901 Long term (current) use of anticoagulants: Secondary | ICD-10-CM

## 2011-09-04 LAB — POCT INR: INR: 2.4

## 2011-09-05 ENCOUNTER — Encounter (HOSPITAL_COMMUNITY)
Admission: RE | Admit: 2011-09-05 | Discharge: 2011-09-05 | Disposition: A | Discharge status: Home / Self Care | Payer: Self-pay | Source: Ambulatory Visit | Attending: Cardiology | Admitting: Cardiology

## 2011-09-06 ENCOUNTER — Encounter (HOSPITAL_COMMUNITY)
Admission: RE | Admit: 2011-09-06 | Discharge: 2011-09-06 | Disposition: A | Discharge status: Home / Self Care | Payer: Self-pay | Source: Ambulatory Visit | Attending: Cardiology | Admitting: Cardiology

## 2011-09-07 ENCOUNTER — Encounter (HOSPITAL_COMMUNITY)
Admission: RE | Admit: 2011-09-07 | Discharge: 2011-09-07 | Disposition: A | Discharge status: Home / Self Care | Payer: Self-pay | Source: Ambulatory Visit | Attending: Cardiology | Admitting: Cardiology

## 2011-09-12 ENCOUNTER — Encounter (HOSPITAL_COMMUNITY)
Admission: RE | Admit: 2011-09-12 | Discharge: 2011-09-12 | Disposition: A | Discharge status: Home / Self Care | Payer: Self-pay | Source: Ambulatory Visit | Attending: Cardiology | Admitting: Cardiology

## 2011-09-12 DIAGNOSIS — Z951 Presence of aortocoronary bypass graft: Secondary | ICD-10-CM | POA: Insufficient documentation

## 2011-09-12 DIAGNOSIS — I4891 Unspecified atrial fibrillation: Secondary | ICD-10-CM | POA: Insufficient documentation

## 2011-09-12 DIAGNOSIS — I472 Ventricular tachycardia, unspecified: Secondary | ICD-10-CM | POA: Insufficient documentation

## 2011-09-12 DIAGNOSIS — R0989 Other specified symptoms and signs involving the circulatory and respiratory systems: Secondary | ICD-10-CM | POA: Insufficient documentation

## 2011-09-12 DIAGNOSIS — I359 Nonrheumatic aortic valve disorder, unspecified: Secondary | ICD-10-CM | POA: Insufficient documentation

## 2011-09-12 DIAGNOSIS — Z95810 Presence of automatic (implantable) cardiac defibrillator: Secondary | ICD-10-CM | POA: Insufficient documentation

## 2011-09-12 DIAGNOSIS — I2589 Other forms of chronic ischemic heart disease: Secondary | ICD-10-CM | POA: Insufficient documentation

## 2011-09-12 DIAGNOSIS — I4729 Other ventricular tachycardia: Secondary | ICD-10-CM | POA: Insufficient documentation

## 2011-09-12 DIAGNOSIS — Z5189 Encounter for other specified aftercare: Secondary | ICD-10-CM | POA: Insufficient documentation

## 2011-09-13 ENCOUNTER — Encounter (HOSPITAL_COMMUNITY): Payer: Self-pay

## 2011-09-14 ENCOUNTER — Encounter (HOSPITAL_COMMUNITY): Payer: Self-pay

## 2011-09-18 ENCOUNTER — Ambulatory Visit: Payer: Self-pay | Admitting: Cardiology

## 2011-09-18 DIAGNOSIS — Z7901 Long term (current) use of anticoagulants: Secondary | ICD-10-CM

## 2011-09-18 DIAGNOSIS — I4891 Unspecified atrial fibrillation: Secondary | ICD-10-CM

## 2011-09-19 ENCOUNTER — Encounter (HOSPITAL_COMMUNITY)
Admission: RE | Admit: 2011-09-19 | Discharge: 2011-09-19 | Disposition: A | Discharge status: Home / Self Care | Payer: Self-pay | Source: Ambulatory Visit | Attending: Cardiology | Admitting: Cardiology

## 2011-09-20 ENCOUNTER — Encounter (HOSPITAL_COMMUNITY): Payer: Self-pay

## 2011-09-21 ENCOUNTER — Encounter (HOSPITAL_COMMUNITY): Payer: Self-pay

## 2011-09-22 DIAGNOSIS — M1710 Unilateral primary osteoarthritis, unspecified knee: Secondary | ICD-10-CM | POA: Diagnosis not present

## 2011-09-22 DIAGNOSIS — G540 Brachial plexus disorders: Secondary | ICD-10-CM | POA: Diagnosis not present

## 2011-09-25 ENCOUNTER — Ambulatory Visit (INDEPENDENT_AMBULATORY_CARE_PROVIDER_SITE_OTHER): Payer: Medicare Other | Admitting: Internal Medicine

## 2011-09-25 DIAGNOSIS — E039 Hypothyroidism, unspecified: Secondary | ICD-10-CM | POA: Diagnosis not present

## 2011-09-25 DIAGNOSIS — E785 Hyperlipidemia, unspecified: Secondary | ICD-10-CM | POA: Diagnosis not present

## 2011-09-25 DIAGNOSIS — I428 Other cardiomyopathies: Secondary | ICD-10-CM | POA: Diagnosis not present

## 2011-09-25 DIAGNOSIS — Z7901 Long term (current) use of anticoagulants: Secondary | ICD-10-CM

## 2011-09-25 DIAGNOSIS — I4891 Unspecified atrial fibrillation: Secondary | ICD-10-CM

## 2011-09-25 DIAGNOSIS — I2510 Atherosclerotic heart disease of native coronary artery without angina pectoris: Secondary | ICD-10-CM | POA: Diagnosis not present

## 2011-09-26 ENCOUNTER — Encounter (HOSPITAL_COMMUNITY): Payer: Self-pay

## 2011-09-27 ENCOUNTER — Encounter (HOSPITAL_COMMUNITY): Payer: Self-pay

## 2011-09-27 ENCOUNTER — Telehealth: Payer: Self-pay | Admitting: Internal Medicine

## 2011-09-28 ENCOUNTER — Encounter (HOSPITAL_COMMUNITY): Payer: Self-pay

## 2011-10-02 ENCOUNTER — Ambulatory Visit: Payer: Self-pay | Admitting: Cardiovascular Disease

## 2011-10-02 DIAGNOSIS — Z7901 Long term (current) use of anticoagulants: Secondary | ICD-10-CM

## 2011-10-02 DIAGNOSIS — I4891 Unspecified atrial fibrillation: Secondary | ICD-10-CM | POA: Diagnosis not present

## 2011-10-02 LAB — POCT INR: INR: 2.9

## 2011-10-03 ENCOUNTER — Encounter (HOSPITAL_COMMUNITY)
Admission: RE | Admit: 2011-10-03 | Discharge: 2011-10-03 | Disposition: A | Discharge status: Home / Self Care | Payer: Self-pay | Source: Ambulatory Visit | Attending: Cardiology | Admitting: Cardiology

## 2011-10-04 ENCOUNTER — Encounter (HOSPITAL_COMMUNITY)
Admission: RE | Admit: 2011-10-04 | Discharge: 2011-10-04 | Disposition: A | Discharge status: Home / Self Care | Payer: Self-pay | Source: Ambulatory Visit | Attending: Cardiology | Admitting: Cardiology

## 2011-10-05 ENCOUNTER — Encounter (HOSPITAL_COMMUNITY)
Admission: RE | Admit: 2011-10-05 | Discharge: 2011-10-05 | Disposition: A | Discharge status: Home / Self Care | Payer: Self-pay | Source: Ambulatory Visit | Attending: Cardiology | Admitting: Cardiology

## 2011-10-09 ENCOUNTER — Ambulatory Visit: Payer: Self-pay | Admitting: Cardiovascular Disease

## 2011-10-09 DIAGNOSIS — I4891 Unspecified atrial fibrillation: Secondary | ICD-10-CM

## 2011-10-09 DIAGNOSIS — Z7901 Long term (current) use of anticoagulants: Secondary | ICD-10-CM

## 2011-10-09 LAB — POCT INR: INR: 2.6

## 2011-10-10 ENCOUNTER — Encounter (HOSPITAL_COMMUNITY)
Admission: RE | Admit: 2011-10-10 | Discharge: 2011-10-10 | Disposition: A | Discharge status: Home / Self Care | Payer: Self-pay | Source: Ambulatory Visit | Attending: Cardiology | Admitting: Cardiology

## 2011-10-11 ENCOUNTER — Encounter (HOSPITAL_COMMUNITY)
Admission: RE | Admit: 2011-10-11 | Discharge: 2011-10-11 | Disposition: A | Discharge status: Home / Self Care | Payer: Self-pay | Source: Ambulatory Visit | Attending: Cardiology | Admitting: Cardiology

## 2011-10-12 ENCOUNTER — Other Ambulatory Visit: Payer: Self-pay | Admitting: Cardiology

## 2011-10-12 ENCOUNTER — Encounter (HOSPITAL_COMMUNITY): Payer: Self-pay

## 2011-10-12 DIAGNOSIS — Z95810 Presence of automatic (implantable) cardiac defibrillator: Secondary | ICD-10-CM | POA: Insufficient documentation

## 2011-10-12 DIAGNOSIS — I4729 Other ventricular tachycardia: Secondary | ICD-10-CM | POA: Insufficient documentation

## 2011-10-12 DIAGNOSIS — R0989 Other specified symptoms and signs involving the circulatory and respiratory systems: Secondary | ICD-10-CM | POA: Insufficient documentation

## 2011-10-12 DIAGNOSIS — I359 Nonrheumatic aortic valve disorder, unspecified: Secondary | ICD-10-CM | POA: Insufficient documentation

## 2011-10-12 DIAGNOSIS — Z5189 Encounter for other specified aftercare: Secondary | ICD-10-CM | POA: Insufficient documentation

## 2011-10-12 DIAGNOSIS — Z951 Presence of aortocoronary bypass graft: Secondary | ICD-10-CM | POA: Insufficient documentation

## 2011-10-12 DIAGNOSIS — I472 Ventricular tachycardia, unspecified: Secondary | ICD-10-CM | POA: Insufficient documentation

## 2011-10-12 DIAGNOSIS — I2589 Other forms of chronic ischemic heart disease: Secondary | ICD-10-CM | POA: Insufficient documentation

## 2011-10-12 DIAGNOSIS — I4891 Unspecified atrial fibrillation: Secondary | ICD-10-CM | POA: Insufficient documentation

## 2011-10-16 DIAGNOSIS — C61 Malignant neoplasm of prostate: Secondary | ICD-10-CM | POA: Diagnosis not present

## 2011-10-16 DIAGNOSIS — N320 Bladder-neck obstruction: Secondary | ICD-10-CM | POA: Diagnosis not present

## 2011-10-17 ENCOUNTER — Encounter (HOSPITAL_COMMUNITY)
Admission: RE | Admit: 2011-10-17 | Discharge: 2011-10-17 | Disposition: A | Discharge status: Home / Self Care | Payer: Self-pay | Source: Ambulatory Visit | Attending: Cardiology | Admitting: Cardiology

## 2011-10-18 ENCOUNTER — Encounter (HOSPITAL_COMMUNITY): Payer: Self-pay

## 2011-10-19 ENCOUNTER — Other Ambulatory Visit: Payer: Self-pay | Admitting: *Deleted

## 2011-10-19 ENCOUNTER — Encounter (HOSPITAL_COMMUNITY)
Admission: RE | Admit: 2011-10-19 | Discharge: 2011-10-19 | Disposition: A | Discharge status: Home / Self Care | Payer: Self-pay | Source: Ambulatory Visit | Attending: Cardiology | Admitting: Cardiology

## 2011-10-19 MED ORDER — FUROSEMIDE 20 MG PO TABS: 20.0000 mg | ORAL_TABLET | ORAL | Status: DC

## 2011-10-24 ENCOUNTER — Encounter (HOSPITAL_COMMUNITY): Payer: Self-pay

## 2011-10-24 ENCOUNTER — Ambulatory Visit: Payer: Self-pay | Admitting: Cardiovascular Disease

## 2011-10-24 DIAGNOSIS — Z7901 Long term (current) use of anticoagulants: Secondary | ICD-10-CM

## 2011-10-24 DIAGNOSIS — I4891 Unspecified atrial fibrillation: Secondary | ICD-10-CM

## 2011-10-24 LAB — POCT INR: INR: 2.7

## 2011-10-25 ENCOUNTER — Encounter (HOSPITAL_COMMUNITY)
Admission: RE | Admit: 2011-10-25 | Discharge: 2011-10-25 | Disposition: A | Discharge status: Home / Self Care | Payer: Self-pay | Source: Ambulatory Visit | Attending: Cardiology | Admitting: Cardiology

## 2011-10-26 ENCOUNTER — Encounter (HOSPITAL_COMMUNITY)
Admission: RE | Admit: 2011-10-26 | Discharge: 2011-10-26 | Disposition: A | Discharge status: Home / Self Care | Payer: Self-pay | Source: Ambulatory Visit | Attending: Cardiology | Admitting: Cardiology

## 2011-10-31 ENCOUNTER — Encounter (HOSPITAL_COMMUNITY)
Admission: RE | Admit: 2011-10-31 | Discharge: 2011-10-31 | Disposition: A | Discharge status: Home / Self Care | Payer: Self-pay | Source: Ambulatory Visit | Attending: Cardiology | Admitting: Cardiology

## 2011-10-31 DIAGNOSIS — H16229 Keratoconjunctivitis sicca, not specified as Sjogren's, unspecified eye: Secondary | ICD-10-CM | POA: Diagnosis not present

## 2011-11-01 ENCOUNTER — Encounter (HOSPITAL_COMMUNITY)
Admission: RE | Admit: 2011-11-01 | Discharge: 2011-11-01 | Disposition: A | Discharge status: Home / Self Care | Payer: Self-pay | Source: Ambulatory Visit | Attending: Cardiology | Admitting: Cardiology

## 2011-11-02 ENCOUNTER — Encounter (HOSPITAL_COMMUNITY): Payer: Self-pay

## 2011-11-06 ENCOUNTER — Ambulatory Visit (INDEPENDENT_AMBULATORY_CARE_PROVIDER_SITE_OTHER): Payer: Medicare Other | Admitting: Internal Medicine

## 2011-11-06 DIAGNOSIS — Z7901 Long term (current) use of anticoagulants: Secondary | ICD-10-CM

## 2011-11-06 DIAGNOSIS — I4891 Unspecified atrial fibrillation: Secondary | ICD-10-CM

## 2011-11-06 LAB — POCT INR: INR: 3.7

## 2011-11-07 ENCOUNTER — Encounter (HOSPITAL_COMMUNITY): Payer: Medicare Other

## 2011-11-08 ENCOUNTER — Encounter (HOSPITAL_COMMUNITY)
Admission: RE | Admit: 2011-11-08 | Discharge: 2011-11-08 | Disposition: A | Discharge status: Home / Self Care | Payer: Self-pay | Source: Ambulatory Visit | Attending: Cardiology | Admitting: Cardiology

## 2011-11-09 ENCOUNTER — Encounter (HOSPITAL_COMMUNITY): Payer: Medicare Other

## 2011-11-09 ENCOUNTER — Ambulatory Visit (INDEPENDENT_AMBULATORY_CARE_PROVIDER_SITE_OTHER): Payer: Medicare Other | Admitting: *Deleted

## 2011-11-09 ENCOUNTER — Encounter: Payer: Self-pay | Admitting: Internal Medicine

## 2011-11-09 DIAGNOSIS — Z95810 Presence of automatic (implantable) cardiac defibrillator: Secondary | ICD-10-CM

## 2011-11-09 DIAGNOSIS — I2589 Other forms of chronic ischemic heart disease: Secondary | ICD-10-CM

## 2011-11-09 DIAGNOSIS — I255 Ischemic cardiomyopathy: Secondary | ICD-10-CM

## 2011-11-10 LAB — REMOTE ICD DEVICE
BATTERY VOLTAGE: 2.69 V
PACEART VT: 1
RV LEAD IMPEDENCE ICD: 584 Ohm
TOT-0006: 20130516000000
TZAT-0004FASTVT: 8
TZAT-0004SLOWVT: 6
TZAT-0004SLOWVT: 8
TZAT-0005FASTVT: 88 pct
TZAT-0005SLOWVT: 84 pct
TZAT-0005SLOWVT: 91 pct
TZAT-0011FASTVT: 10 ms
TZAT-0011SLOWVT: 10 ms
TZAT-0011SLOWVT: 10 ms
TZAT-0012SLOWVT: 200 ms
TZAT-0012SLOWVT: 200 ms
TZAT-0013FASTVT: 1
TZAT-0013SLOWVT: 2
TZAT-0013SLOWVT: 2
TZAT-0018FASTVT: NEGATIVE
TZAT-0020SLOWVT: 1.6 ms
TZAT-0020SLOWVT: 1.6 ms
TZON-0003SLOWVT: 400 ms
TZON-0004SLOWVT: 16
TZON-0011AFLUTTER: 70
TZST-0001FASTVT: 2
TZST-0001FASTVT: 3
TZST-0001FASTVT: 5
TZST-0001SLOWVT: 5
TZST-0001SLOWVT: 6
TZST-0003FASTVT: 35 J
TZST-0003FASTVT: 35 J
TZST-0003SLOWVT: 35 J
TZST-0003SLOWVT: 5 J
VENTRICULAR PACING ICD: 9 pct

## 2011-11-14 ENCOUNTER — Encounter: Payer: Self-pay | Admitting: Cardiology

## 2011-11-14 ENCOUNTER — Encounter (HOSPITAL_COMMUNITY): Payer: Self-pay

## 2011-11-14 DIAGNOSIS — Z951 Presence of aortocoronary bypass graft: Secondary | ICD-10-CM | POA: Insufficient documentation

## 2011-11-14 DIAGNOSIS — R0989 Other specified symptoms and signs involving the circulatory and respiratory systems: Secondary | ICD-10-CM | POA: Insufficient documentation

## 2011-11-14 DIAGNOSIS — I359 Nonrheumatic aortic valve disorder, unspecified: Secondary | ICD-10-CM | POA: Insufficient documentation

## 2011-11-14 DIAGNOSIS — K219 Gastro-esophageal reflux disease without esophagitis: Secondary | ICD-10-CM | POA: Diagnosis not present

## 2011-11-14 DIAGNOSIS — Z95810 Presence of automatic (implantable) cardiac defibrillator: Secondary | ICD-10-CM | POA: Insufficient documentation

## 2011-11-14 DIAGNOSIS — I4891 Unspecified atrial fibrillation: Secondary | ICD-10-CM | POA: Insufficient documentation

## 2011-11-14 DIAGNOSIS — I472 Ventricular tachycardia, unspecified: Secondary | ICD-10-CM | POA: Insufficient documentation

## 2011-11-14 DIAGNOSIS — I4729 Other ventricular tachycardia: Secondary | ICD-10-CM | POA: Insufficient documentation

## 2011-11-14 DIAGNOSIS — I2510 Atherosclerotic heart disease of native coronary artery without angina pectoris: Secondary | ICD-10-CM | POA: Diagnosis not present

## 2011-11-14 DIAGNOSIS — I509 Heart failure, unspecified: Secondary | ICD-10-CM | POA: Diagnosis not present

## 2011-11-14 DIAGNOSIS — R9431 Abnormal electrocardiogram [ECG] [EKG]: Secondary | ICD-10-CM | POA: Diagnosis not present

## 2011-11-14 DIAGNOSIS — Z5189 Encounter for other specified aftercare: Secondary | ICD-10-CM | POA: Insufficient documentation

## 2011-11-14 DIAGNOSIS — I2589 Other forms of chronic ischemic heart disease: Secondary | ICD-10-CM | POA: Insufficient documentation

## 2011-11-15 ENCOUNTER — Ambulatory Visit: Payer: Self-pay | Admitting: Cardiovascular Disease

## 2011-11-15 ENCOUNTER — Ambulatory Visit (INDEPENDENT_AMBULATORY_CARE_PROVIDER_SITE_OTHER): Payer: Medicare Other | Admitting: Cardiology

## 2011-11-15 ENCOUNTER — Encounter: Payer: Self-pay | Admitting: Cardiology

## 2011-11-15 ENCOUNTER — Encounter (HOSPITAL_COMMUNITY): Payer: Self-pay

## 2011-11-15 VITALS — Ht 75.0 in | Wt 209.8 lb

## 2011-11-15 DIAGNOSIS — I255 Ischemic cardiomyopathy: Secondary | ICD-10-CM

## 2011-11-15 DIAGNOSIS — R0602 Shortness of breath: Secondary | ICD-10-CM | POA: Diagnosis not present

## 2011-11-15 DIAGNOSIS — I4891 Unspecified atrial fibrillation: Secondary | ICD-10-CM

## 2011-11-15 DIAGNOSIS — I359 Nonrheumatic aortic valve disorder, unspecified: Secondary | ICD-10-CM | POA: Diagnosis not present

## 2011-11-15 DIAGNOSIS — Z952 Presence of prosthetic heart valve: Secondary | ICD-10-CM

## 2011-11-15 DIAGNOSIS — E785 Hyperlipidemia, unspecified: Secondary | ICD-10-CM | POA: Diagnosis not present

## 2011-11-15 DIAGNOSIS — Z7901 Long term (current) use of anticoagulants: Secondary | ICD-10-CM

## 2011-11-15 DIAGNOSIS — I350 Nonrheumatic aortic (valve) stenosis: Secondary | ICD-10-CM

## 2011-11-15 DIAGNOSIS — Z954 Presence of other heart-valve replacement: Secondary | ICD-10-CM

## 2011-11-15 DIAGNOSIS — I2589 Other forms of chronic ischemic heart disease: Secondary | ICD-10-CM

## 2011-11-15 LAB — BASIC METABOLIC PANEL
BUN: 17 mg/dL (ref 6–23)
Creatinine, Ser: 1.1 mg/dL (ref 0.4–1.5)
GFR: 68.16 mL/min (ref >60.00)
Glucose, Bld: 74 mg/dL (ref 70–99)

## 2011-11-15 LAB — BRAIN NATRIURETIC PEPTIDE: Pro B Natriuretic peptide (BNP): 261 pg/mL — ABNORMAL HIGH (ref 0.0–100.0)

## 2011-11-15 MED ORDER — ROSUVASTATIN CALCIUM 5 MG PO TABS: 5.0000 mg | ORAL_TABLET | ORAL | Status: DC

## 2011-11-15 NOTE — Patient Instructions (Addendum)
Your physician has recommended you make the following change in your medication: STOP Atorvastatin, START Crestor 5mg  take one by mouth every other day  Your physician recommends that you return for a FASTING LIPID and LIVER in 1 MONTH--nothing to eat or drink after midnight, lab opens at 8:30   Your physician recommends that you have lab work today: BMP and BNP  Your physician wants you to follow-up in: 4 MONTHS with Dr Riley Kill.  You will receive a reminder letter in the mail two months in advance. If you don't receive a letter, please call our office to schedule the follow-up appointment.

## 2011-11-16 ENCOUNTER — Encounter (HOSPITAL_COMMUNITY): Payer: Self-pay

## 2011-11-16 ENCOUNTER — Telehealth: Payer: Self-pay | Admitting: Cardiology

## 2011-11-16 NOTE — Telephone Encounter (Signed)
New Problem:    Patient called in wanting to get Dr. Rosalyn Charters approval to take lisinopril that his [place] doctor Dr. Charlyn Minerva sent him.  Please call back.

## 2011-11-16 NOTE — Telephone Encounter (Signed)
Per Dr Riley Kill the pt should hold off on taking Lisinopril at this time.  Dr Riley Kill would like to get the pt's records before making a determination. I left a message on the pt's voicemail with this information.

## 2011-11-17 ENCOUNTER — Encounter: Payer: Self-pay | Admitting: Cardiology

## 2011-11-17 NOTE — Telephone Encounter (Signed)
I spoke with the pt and his MRN is [id number] at the [facility].  The pt said we can try to obtain his records through [email or web address] or [email or web address], we can also call [phone] (Dr Elmer Picker).

## 2011-11-17 NOTE — Telephone Encounter (Signed)
Left message for pt to call back with contact information for the [facility].

## 2011-11-17 NOTE — Telephone Encounter (Signed)
This encounter was created in error - please disregard.

## 2011-11-17 NOTE — Telephone Encounter (Signed)
NEW MESSAGE:  PT CALLED AND HAS SOME INFO REGARDING GETTING HIS RECORDS FROM THE [facility].  CALL HIS CELL NUMBER

## 2011-11-18 NOTE — Progress Notes (Addendum)
HPI:  The patient returns today in followup, and had a nice conversation. We do not have available yet the information from the [facility] clinic which he visited yesterday. However, they were pleased with this echocardiographic results, and the patient's functional capacity has continued to improve. He now does not get dyspneic with activity. Some recommendations were made, including an every other day low-dose statin, and the potential for the addition of an ACE inhibitor to his regimen.  Current Outpatient Prescriptions  Medication Sig Dispense Refill  . alfuzosin (UROXATRAL) 10 MG 24 hr tablet Take 10 mg by mouth daily.        Marland Kitchen aspirin 81 MG chewable tablet Chew 81 mg by mouth daily.       . carvedilol (COREG) 12.5 MG tablet Take 1 tablet (12.5 mg total) by mouth 2 (two) times daily.  60 tablet  6  . clindamycin (CLEOCIN) 150 MG capsule TAKE 4 CAPSULES 60 MIN BEFORE DENTAL PROCEDURE.  4 capsule  6  . digoxin (LANOXIN) 0.125 MG tablet Take only a half of a tablet daily  30 tablet  11  . furosemide (LASIX) 20 MG tablet Take 1 tablet (20 mg total) by mouth every 3 (three) days.  30 tablet  1  . Multiple Vitamin (MULTIVITAMIN) tablet Take 1 tablet by mouth daily.        . nitroGLYCERIN (NITROSTAT) 0.4 MG SL tablet Place 1 tablet (0.4 mg total) under the tongue every 5 (five) minutes as needed for chest pain.  25 tablet  6  . omeprazole (PRILOSEC OTC) 20 MG tablet Take 1 tablet (20 mg total) by mouth daily. For acid reflux      . psyllium (METAMUCIL) 58.6 % powder Take 1 packet by mouth as needed. For constipation      . SYNTHROID 150 MCG tablet TAKE 1 TABLET ONCE DAILY.  60 each  4  . warfarin (COUMADIN) 5 MG tablet Take as directed by coumadin clinic  40 tablet  3  . rosuvastatin (CRESTOR) 5 MG tablet Take 1 tablet (5 mg total) by mouth every other day.  30 tablet  6  . sildenafil (VIAGRA) 50 MG tablet Take 1 tablet (50 mg total) by mouth daily as needed for erectile dysfunction.  10 tablet  2      Allergies  Allergen Reactions  . Antihistamines, Diphenhydramine-Type Other (See Comments)    unknown  . Penicillins Other (See Comments)    Couldn't breathe.    Past Medical History  Diagnosis Date  . Ischemic cardiomyopathy 05/2010    Has EF of 25%  . Diverticulitis     CURRENTLY CONTROLLED WITH NO EVIDENCE OF RECURRENT INFECTION  . AF (atrial fibrillation)     Has not tolerated amiodarone in the past. Amiodarone was stopped in September of 2010 due to side effects  . Chronic anticoagulation     on coumadin  . ICD (implantable cardiac defibrillator) in place   . Prostate cancer   . Osteoarthritis     RIGHT KNEE  . Aortic stenosis, severe     WITH PERCUTANEOUS AORTIC VALVE (TAVI) IN March 2012 at the [facility]  . MI (myocardial infarction) 1974, 31  . Cervical myelopathy   . Pulmonary embolism   . Hyperlipidemia   . GERD (gastroesophageal reflux disease)   . Esophageal stricture     WITH DILATATION  . NSVT (nonsustained ventricular tachycardia)     Past Surgical History  Procedure Date  . Icd Feb 2003  .  Coronary artery bypass graft 1979  . Coronary artery bypass graft 1991    REDO SURGERY  . Cardiac catheterization 2010    SEVERE LV DYSFUNCTION WITH ESTIMATED EJECTION FRACTION OF 25%  . Cholecystectomy   . Aortic valve replacement     Percutaneous AVR in March 2012 at the [facility]  . Cardioversion 02/16/2011    Procedure: CARDIOVERSION;  Surgeon: Luis Abed, MD;  Location: [facility] OR;  Service: Cardiovascular;  Laterality: N/A;    Family History  Problem Relation Age of Onset  . Heart disease Mother 13  . Diabetes Mother 30  . Heart disease Father 87    History   Social History  . Marital Status: Married    Spouse Name: N/A    Number of Children: Y  . Years of Education: N/A   Occupational History  . retired. developer.     Social History Main Topics  . Smoking status: Former Smoker -- 4.0 packs/day for 15 years    Types:  Cigarettes    Quit date: 06/15/1969  . Smokeless tobacco: Never Used  . Alcohol Use: No  . Drug Use: No  . Sexually Active: Yes   Other Topics Concern  . Not on file   Social History Narrative  . No narrative on file    ROS: Please see the HPI.  All other systems reviewed and negative.  PHYSICAL EXAM:  118/ 62  P 52    Ht 6\' 3"  (1.905 m)  Wt 209 lb 12.8 oz (95.165 kg)  BMI 26.22 kg/m2  General: Well developed, well nourished, in no acute distress. Head:  Normocephalic and atraumatic. Neck: no JVD Lungs: Clear to auscultation and percussion. Heart: Normal S1 and S2.  No murmur, rubs or gallops.  Abdomen:  Normal bowel sounds; soft; non tender; no organomegaly Pulses: Pulses normal in all 4 extremities. Extremities: No clubbing or cyanosis. No edema. Neurologic: Alert and oriented x 3.  EKG:  Atrial fib with slow ventricular response.  Rate 52.  Small inferior Q waves.  ST and T changes nonspecific.    ASSESSMENT AND PLAN:

## 2011-11-20 NOTE — Telephone Encounter (Signed)
Dr Riley Kill reviewed documents and spoke with the pt at length.  I will forward this message to Dr Riley Kill for documentation.

## 2011-11-21 ENCOUNTER — Encounter (HOSPITAL_COMMUNITY)
Admission: RE | Admit: 2011-11-21 | Discharge: 2011-11-21 | Disposition: A | Discharge status: Home / Self Care | Payer: Self-pay | Source: Ambulatory Visit | Attending: Cardiology | Admitting: Cardiology

## 2011-11-21 ENCOUNTER — Encounter: Payer: Self-pay | Admitting: Cardiology

## 2011-11-22 ENCOUNTER — Encounter (HOSPITAL_COMMUNITY)
Admission: RE | Admit: 2011-11-22 | Discharge: 2011-11-22 | Disposition: A | Discharge status: Home / Self Care | Payer: Self-pay | Source: Ambulatory Visit | Attending: Cardiology | Admitting: Cardiology

## 2011-11-23 ENCOUNTER — Encounter (HOSPITAL_COMMUNITY)
Admission: RE | Admit: 2011-11-23 | Discharge: 2011-11-23 | Disposition: A | Discharge status: Home / Self Care | Payer: Self-pay | Source: Ambulatory Visit | Attending: Cardiology | Admitting: Cardiology

## 2011-11-23 ENCOUNTER — Encounter: Payer: Self-pay | Admitting: *Deleted

## 2011-11-24 ENCOUNTER — Ambulatory Visit: Payer: Self-pay | Admitting: Cardiology

## 2011-11-24 DIAGNOSIS — Z7901 Long term (current) use of anticoagulants: Secondary | ICD-10-CM | POA: Diagnosis not present

## 2011-11-24 DIAGNOSIS — I4891 Unspecified atrial fibrillation: Secondary | ICD-10-CM | POA: Diagnosis not present

## 2011-11-24 DIAGNOSIS — D485 Neoplasm of uncertain behavior of skin: Secondary | ICD-10-CM | POA: Diagnosis not present

## 2011-11-24 DIAGNOSIS — L570 Actinic keratosis: Secondary | ICD-10-CM | POA: Diagnosis not present

## 2011-11-24 DIAGNOSIS — L578 Other skin changes due to chronic exposure to nonionizing radiation: Secondary | ICD-10-CM | POA: Diagnosis not present

## 2011-11-27 ENCOUNTER — Ambulatory Visit: Payer: Self-pay | Admitting: Cardiology

## 2011-11-27 DIAGNOSIS — Z7901 Long term (current) use of anticoagulants: Secondary | ICD-10-CM

## 2011-11-27 DIAGNOSIS — I4891 Unspecified atrial fibrillation: Secondary | ICD-10-CM

## 2011-11-28 ENCOUNTER — Encounter (HOSPITAL_COMMUNITY)
Admission: RE | Admit: 2011-11-28 | Discharge: 2011-11-28 | Disposition: A | Discharge status: Home / Self Care | Payer: Self-pay | Source: Ambulatory Visit | Attending: Cardiology | Admitting: Cardiology

## 2011-11-29 ENCOUNTER — Encounter (HOSPITAL_COMMUNITY): Payer: Self-pay

## 2011-11-29 NOTE — Telephone Encounter (Signed)
As noted, I received records from the [facility] and reviewed them in detail, then spoke with the patient shortly thereafter.  Echo shows improvement.  Afib rate controlled. Lipid strategy suggested by cardiologist at the Clinic which  I endorsed as an attempt.  We will continue to follow the patient closely.

## 2011-11-29 NOTE — Assessment & Plan Note (Signed)
Patient had TAVR at the [facility], and follow up yesterday.  They thought he was doing well.  His EF apparently is stable, and he notes that he is able to do things on a regular basis that he could not prior to the procedure.  He discussed taking on a big sailing project, and I encouraged him not to overdo despite how well he feels, as getting over the edge might compromise the progress he has made.  He was in concordance with that discussion.

## 2011-11-29 NOTE — Assessment & Plan Note (Addendum)
Records from the [facility] reviewed in detail.  LV dilated.  RV normal with estimated RVS of 34.  LA dilated.  MAC noted with trivial MR.  Mild 1-2 AR.  Edwards Sapien 26mm AVR----Mean aortiv velocity now 140 cm/sec.  EF calculated at 36 plus/minus 5.  Good result from TAVR.  Bun/Cr and K all acceptable.  ECG showed atrial fib with controlled VR.  Some discussion was given to the addition of an ACE inhibitor to his regimen.  We will need to review.

## 2011-11-29 NOTE — Assessment & Plan Note (Signed)
Rate seems to be well controlled.

## 2011-11-29 NOTE — Assessment & Plan Note (Signed)
Was placed on every other day lipid lowering agent which I endorsed as a strategy for this patient.

## 2011-11-30 ENCOUNTER — Encounter: Payer: Self-pay | Admitting: Nurse Practitioner

## 2011-11-30 ENCOUNTER — Encounter (HOSPITAL_COMMUNITY): Payer: Self-pay

## 2011-12-01 NOTE — Progress Notes (Signed)
This encounter was created in error - please disregard.

## 2011-12-02 ENCOUNTER — Encounter (HOSPITAL_COMMUNITY): Payer: Self-pay | Admitting: Physical Medicine and Rehabilitation

## 2011-12-02 ENCOUNTER — Emergency Department (HOSPITAL_COMMUNITY): Payer: Medicare Other

## 2011-12-02 ENCOUNTER — Other Ambulatory Visit: Payer: Self-pay

## 2011-12-02 ENCOUNTER — Observation Stay (HOSPITAL_COMMUNITY)
Admission: EM | Admit: 2011-12-02 | Discharge: 2011-12-04 | Disposition: A | Discharge status: Home / Self Care | Payer: Medicare Other | Attending: Internal Medicine | Admitting: Internal Medicine

## 2011-12-02 DIAGNOSIS — M1710 Unilateral primary osteoarthritis, unspecified knee: Secondary | ICD-10-CM | POA: Insufficient documentation

## 2011-12-02 DIAGNOSIS — Z954 Presence of other heart-valve replacement: Secondary | ICD-10-CM | POA: Diagnosis not present

## 2011-12-02 DIAGNOSIS — I639 Cerebral infarction, unspecified: Secondary | ICD-10-CM

## 2011-12-02 DIAGNOSIS — R55 Syncope and collapse: Secondary | ICD-10-CM | POA: Diagnosis not present

## 2011-12-02 DIAGNOSIS — R110 Nausea: Secondary | ICD-10-CM

## 2011-12-02 DIAGNOSIS — R112 Nausea with vomiting, unspecified: Secondary | ICD-10-CM | POA: Diagnosis not present

## 2011-12-02 DIAGNOSIS — I2589 Other forms of chronic ischemic heart disease: Secondary | ICD-10-CM | POA: Diagnosis not present

## 2011-12-02 DIAGNOSIS — Z7901 Long term (current) use of anticoagulants: Secondary | ICD-10-CM | POA: Diagnosis not present

## 2011-12-02 DIAGNOSIS — H5500 Unspecified nystagmus: Secondary | ICD-10-CM

## 2011-12-02 DIAGNOSIS — R0989 Other specified symptoms and signs involving the circulatory and respiratory systems: Secondary | ICD-10-CM

## 2011-12-02 DIAGNOSIS — R42 Dizziness and giddiness: Secondary | ICD-10-CM

## 2011-12-02 DIAGNOSIS — I4891 Unspecified atrial fibrillation: Secondary | ICD-10-CM | POA: Diagnosis not present

## 2011-12-02 DIAGNOSIS — R2681 Unsteadiness on feet: Secondary | ICD-10-CM | POA: Diagnosis present

## 2011-12-02 DIAGNOSIS — R269 Unspecified abnormalities of gait and mobility: Principal | ICD-10-CM | POA: Insufficient documentation

## 2011-12-02 DIAGNOSIS — I255 Ischemic cardiomyopathy: Secondary | ICD-10-CM | POA: Diagnosis present

## 2011-12-02 DIAGNOSIS — I482 Chronic atrial fibrillation, unspecified: Secondary | ICD-10-CM | POA: Diagnosis present

## 2011-12-02 DIAGNOSIS — R279 Unspecified lack of coordination: Secondary | ICD-10-CM | POA: Diagnosis not present

## 2011-12-02 DIAGNOSIS — Z95810 Presence of automatic (implantable) cardiac defibrillator: Secondary | ICD-10-CM | POA: Insufficient documentation

## 2011-12-02 DIAGNOSIS — I350 Nonrheumatic aortic (valve) stenosis: Secondary | ICD-10-CM

## 2011-12-02 DIAGNOSIS — I6350 Cerebral infarction due to unspecified occlusion or stenosis of unspecified cerebral artery: Secondary | ICD-10-CM | POA: Diagnosis not present

## 2011-12-02 DIAGNOSIS — Z952 Presence of prosthetic heart valve: Secondary | ICD-10-CM

## 2011-12-02 HISTORY — DX: Cardiac murmur, unspecified: R01.1

## 2011-12-02 HISTORY — DX: Cerebral infarction, unspecified: I63.9

## 2011-12-02 HISTORY — DX: Atherosclerotic heart disease of native coronary artery without angina pectoris: I25.10

## 2011-12-02 LAB — COMPREHENSIVE METABOLIC PANEL
ALT: 17 U/L (ref 0–53)
CO2: 28 mEq/L (ref 19–32)
Calcium: 9.1 mg/dL (ref 8.4–10.5)
Chloride: 98 mEq/L (ref 96–112)
Creatinine, Ser: 1.01 mg/dL (ref 0.50–1.35)
GFR calc Af Amer: 81 mL/min — ABNORMAL LOW (ref >90)
GFR calc non Af Amer: 70 mL/min — ABNORMAL LOW (ref >90)
Glucose, Bld: 122 mg/dL — ABNORMAL HIGH (ref 70–99)
Total Bilirubin: 0.5 mg/dL (ref 0.3–1.2)

## 2011-12-02 LAB — CBC WITH DIFFERENTIAL/PLATELET
Eosinophils Relative: 4 % (ref 0–5)
HCT: 40.5 % (ref 39.0–52.0)
Hemoglobin: 13.7 g/dL (ref 13.0–17.0)
Lymphocytes Relative: 12 % (ref 12–46)
Lymphs Abs: 1 10*3/uL (ref 0.7–4.0)
MCV: 91 fL (ref 78.0–100.0)
Monocytes Absolute: 0.5 10*3/uL (ref 0.1–1.0)
Monocytes Relative: 7 % (ref 3–12)
Neutro Abs: 5.9 10*3/uL (ref 1.7–7.7)
RBC: 4.45 MIL/uL (ref 4.22–5.81)
WBC: 7.7 10*3/uL (ref 4.0–10.5)

## 2011-12-02 LAB — PROTIME-INR
INR: 2.63 — ABNORMAL HIGH (ref 0.00–1.49)
Prothrombin Time: 26.8 seconds — ABNORMAL HIGH (ref 11.6–15.2)

## 2011-12-02 MED ORDER — ONDANSETRON HCL 4 MG/2ML IJ SOLN
4.0000 mg | Freq: Once | INTRAMUSCULAR | Status: AC
  Administered 2011-12-02: 4 mg via INTRAVENOUS
  Filled 2011-12-02: qty 2

## 2011-12-02 MED ORDER — SODIUM CHLORIDE 0.9 % IV SOLN
INTRAVENOUS | Status: DC
  Administered 2011-12-02: 1000 mL via INTRAVENOUS

## 2011-12-02 NOTE — ED Provider Notes (Signed)
History     CSN: [id number]  Arrival date & time 12/02/11  [name]   First MD Initiated Contact with Patient 12/02/11 1920      Chief Complaint  Patient presents with  . Near Syncope    (Consider location/radiation/quality/duration/timing/severity/associated sxs/prior treatment) The history is provided by the patient.   the patient is a 78 year old, male, with a history of atrial fibrillation.  Prosthetic heart valve and AICD, who presents emergency department complaining of sudden onset of vision changes, nausea, vomiting, and dizziness.  He was driving, when his symptoms began around 2:00.  This afternoon.  He denies pain anywhere.  He has not had a headache.  The changes in his vision.  Have resolved.   He does not have weakness, or paresthesias.  He denies recent illness.  He states that recently.  He had an INR greater than 5.  Level V caveat applies for possible stroke  Past Medical History  Diagnosis Date  . Ischemic cardiomyopathy 05/2010    Has EF of 25%  . Diverticulitis     CURRENTLY CONTROLLED WITH NO EVIDENCE OF RECURRENT INFECTION  . AF (atrial fibrillation)     Has not tolerated amiodarone in the past. Amiodarone was stopped in September of 2010 due to side effects  . Chronic anticoagulation     on coumadin  . ICD (implantable cardiac defibrillator) in place   . Prostate cancer   . Osteoarthritis     RIGHT KNEE  . Aortic stenosis, severe     WITH PERCUTANEOUS AORTIC VALVE (TAVI) IN March 2012 at the [facility]  . MI (myocardial infarction) 1974, 24  . Cervical myelopathy   . Pulmonary embolism   . Hyperlipidemia   . GERD (gastroesophageal reflux disease)   . Esophageal stricture     WITH DILATATION  . NSVT (nonsustained ventricular tachycardia)     Past Surgical History  Procedure Date  . Icd Feb 2003  . Coronary artery bypass graft 1979  . Coronary artery bypass graft 1991    REDO SURGERY  . Cardiac catheterization 2010    SEVERE LV DYSFUNCTION  WITH ESTIMATED EJECTION FRACTION OF 25%  . Cholecystectomy   . Aortic valve replacement     Percutaneous AVR in March 2012 at the [facility]  . Cardioversion 02/16/2011    Procedure: CARDIOVERSION;  Surgeon: Luis Abed, MD;  Location: [facility] OR;  Service: Cardiovascular;  Laterality: N/A;    Family History  Problem Relation Age of Onset  . Heart disease Mother 81  . Diabetes Mother 55  . Heart disease Father 67    History  Substance Use Topics  . Smoking status: Former Smoker -- 4.0 packs/day for 15 years    Types: Cigarettes    Quit date: 06/15/1969  . Smokeless tobacco: Never Used  . Alcohol Use: No      Review of Systems  Constitutional: Negative for chills and diaphoresis.  HENT: Negative for neck pain.   Eyes:       Flashing lights, with acute onset, which now have resolved  Respiratory: Negative for cough and shortness of breath.   Cardiovascular: Negative for chest pain and palpitations.  Gastrointestinal: Positive for nausea and vomiting. Negative for abdominal pain and diarrhea.  Skin: Negative for rash.  Neurological: Positive for dizziness. Negative for weakness, numbness and headaches.  Psychiatric/Behavioral: Negative for confusion.    Allergies  Penicillins; Ace inhibitors; and Antihistamines, diphenhydramine-type  Home Medications   Current Outpatient Rx  Name Route Sig  Dispense Refill  . ALFUZOSIN HCL ER 10 MG PO TB24 Oral Take 10 mg by mouth at bedtime.     . ASPIRIN 81 MG PO CHEW Oral Chew 81 mg by mouth daily.     Marland Kitchen CARVEDILOL 12.5 MG PO TABS Oral Take 12.5 mg by mouth 2 (two) times daily with a meal.    . DIGOXIN 0.125 MG PO TABS Oral Take 0.0625 mg by mouth daily.    . FUROSEMIDE 20 MG PO TABS Oral Take 20 mg by mouth every 3 (three) days.    Marland Kitchen LEVOTHYROXINE SODIUM 150 MCG PO TABS Oral Take 150 mcg by mouth daily.    Marland Kitchen ONE-DAILY MULTI VITAMINS PO TABS Oral Take 1 tablet by mouth daily.      Marland Kitchen NITROGLYCERIN 0.4 MG SL SUBL Sublingual Place  0.4 mg under the tongue every 5 (five) minutes as needed. For chest pain    . OMEPRAZOLE 20 MG PO CPDR Oral Take 20 mg by mouth 2 (two) times daily.    Marland Kitchen POLYETHYLENE GLYCOL 3350 PO POWD Oral Take 17 g by mouth daily as needed. For constipation    . ROSUVASTATIN CALCIUM 5 MG PO TABS Oral Take 5 mg by mouth every other day.    . WARFARIN SODIUM 5 MG PO TABS Oral Take 5 mg by mouth every evening.    Marland Kitchen SILDENAFIL CITRATE 50 MG PO TABS Oral Take 1 tablet (50 mg total) by mouth daily as needed for erectile dysfunction. 10 tablet 2    BP 149/87  Pulse 56  Temp 97.9 F (36.6 C) (Oral)  Resp 19  SpO2 98%  Physical Exam  Constitutional: He is oriented to person, place, and time. He appears well-developed and well-nourished.  HENT:  Head: Normocephalic and atraumatic.  Eyes: Conjunctivae normal and EOM are normal. Pupils are equal, round, and reactive to light.  Neck: Normal range of motion. Neck supple. No JVD present.       Bilateral carotid bruits  Cardiovascular: Intact distal pulses.   Murmur heard.      Irregular heartbeat with murmur, no mechanical click  Pulmonary/Chest: Effort normal and breath sounds normal. No respiratory distress.  Abdominal: Soft. Bowel sounds are normal. There is no rebound and no guarding.  Musculoskeletal: Normal range of motion. He exhibits no edema.  Neurological: He is alert and oriented to person, place, and time. He has normal strength. He displays no tremor. No cranial nerve deficit. He exhibits normal muscle tone. He displays a negative Romberg sign. Coordination normal.       + horizontal nystagmus to left which does not fatigue  Skin: Skin is warm and dry.  Psychiatric: He has a normal mood and affect. Thought content normal.    ED Course  Procedures (including critical care time) History of atrial fibrillation, with both carotid bruits, and cardiac murmur, presents with sudden onset of dizziness.  Nausea.  His neurological examination, is generally  normal except for lateral nystagmus.  Given his symptoms with acute onset.  I'm concerned that he has a cerebellar stroke.  Will consult neurology, for evaluation and perform laboratory testing, and a CAT scan Labs Reviewed  COMPREHENSIVE METABOLIC PANEL - Abnormal; Notable for the following:    Sodium 134 (*)     Glucose, Bld 122 (*)     GFR calc non Af Amer 70 (*)     GFR calc Af Amer 81 (*)     All other components within normal limits  APTT -  Abnormal; Notable for the following:    aPTT 49 (*)     All other components within normal limits  PROTIME-INR - Abnormal; Notable for the following:    Prothrombin Time 26.8 (*)     INR 2.63 (*)     All other components within normal limits  CBC WITH DIFFERENTIAL  TROPONIN I   Dg Chest 2 View  12/02/2011  *RADIOLOGY REPORT*  Clinical Data: Near syncope.  CHEST - 2 VIEW  Comparison: 11/28/2010  Findings: AICD in good position.  Aortic valve replacement unchanged.  Negative for heart failure.  Negative for infiltrate or effusion.  Lungs remain clear.  IMPRESSION: No acute cardiopulmonary disease.   Original Report Authenticated By: Camelia Phenes, M.D.    Ct Head Wo Contrast  12/02/2011  *RADIOLOGY REPORT*  Clinical Data: Near syncope.  Dizziness with nausea.  Unable to have MR secondary to defibrillator.  CT HEAD WITHOUT CONTRAST  Technique:  Contiguous axial images were obtained from the base of the skull through the vertex without contrast.  Comparison: None.  Findings: No intracranial hemorrhage.  Left lenticular nucleus infarct and right frontal lobe infarct of indeterminate age.  No CT evidence of large acute infarct.  Global atrophy without hydrocephalus  Small vessel disease type changes.  No intracranial mass lesion detected on this unenhanced exam.  Vascular calcifications.  Complete opacification left sphenoid sinus.  IMPRESSION:  No intracranial hemorrhage.  Left lenticular nucleus infarct and right frontal lobe infarct of indeterminate age.   No CT evidence of large acute infarct.  Global atrophy without hydrocephalus  Small vessel disease type changes.  Vascular calcifications.  Complete opacification left sphenoid sinus.   Original Report Authenticated By: Fuller Canada, M.D.      No diagnosis found.  Discussed with the neurologist.  He evaluated the patient and agrees with my concern with cerebellar stroke.  The patient is not a candidate for TPA so we will proceed with the evaluation, and have him admitted to the hospital to the internal medicine service.  Discussed the case with the primary care physician, Dr. Jarold Motto.  Willcome admit the patient for the further evaluation of possible stroke   MDM  Atrial fibrillation Dizziness Nystagmus        Cheri Guppy, MD 12/02/11 2144

## 2011-12-02 NOTE — H&P (Signed)
[name] is an 78 y.o. male.   Chief Complaint: dizziness, nausea, and blurred vision HPI:   The patient is a 78 year old Caucasian man with multiple medical problems who was in his usual state of fairly good health until earlier today when he was sitting in the car and developed a sudden onset of blurred vision with nausea. His symptoms lasted about 10 minutes and then he was able to drive home without difficulty. Again, when getting out of his car he had some blurred vision with severe dizziness (vertigo), but no muscle weakness. He also developed nausea without vomiting. He denied having symptoms of headache, neck pain, chest pain or palpitations, or shortness of breath. He had persistent moderate nausea, so he called me and I recommended emergency room evaluation for possible stroke versus TIA. His symptoms have subsequently improved a great deal, although he did have some mild vertigo earlier this evening when standing to have a chest x-ray done. He had one similar episode after having percutaneous aortic valve replacement done at the [name] in 2012. An ophthalmologist has evaluated him for this in the past and did not note any ophthalmologic problems, and felt that his symptoms could be from ophthalmologic migraine or other CNS etiology.  The patient has a complicated medical history significant for chronic atrial fibrillation, coronary artery disease with ischemic cardiomyopathy and left ventricular ejection fraction of 25-30%, cardiac pacemaker and AICD placement because of ventricular tachycardia, hyperlipidemia, 2012 percutaneous aortic valve replacement, and 1979 coronary artery bypass graft surgery with 1991 redo coronary artery bypass graft surgery.  Past Medical History  Diagnosis Date  . Ischemic cardiomyopathy 05/2010    Has EF of 25%  . Diverticulitis     CURRENTLY CONTROLLED WITH NO EVIDENCE OF RECURRENT INFECTION  . AF (atrial fibrillation)     Has not tolerated  amiodarone in the past. Amiodarone was stopped in September of 2010 due to side effects  . Chronic anticoagulation     on coumadin  . ICD (implantable cardiac defibrillator) in place   . Prostate cancer   . Osteoarthritis     RIGHT KNEE  . Aortic stenosis, severe     WITH PERCUTANEOUS AORTIC VALVE (TAVI) IN March 2012 at the [facility]  . MI (myocardial infarction) 1974, 57  . Cervical myelopathy   . Pulmonary embolism   . Hyperlipidemia   . GERD (gastroesophageal reflux disease)   . Esophageal stricture     WITH DILATATION  . NSVT (nonsustained ventricular tachycardia)   . Coronary artery disease   . Stroke   . Heart murmur   . Pacemaker      (Not in a hospital admission)  ADDITIONAL HOME MEDICATIONS: Omeprazole 20 mg twice daily, multivitamin once daily, Lipitor 10 mg daily, Synthroid 150 mcg daily, Lasix 20 mg by mouth every 3 days, Lanoxin 0.125 mg take one half tablet daily, carvedilol 12.5 mg twice daily, aspirin 81 mg daily, Uroxatral 10 mg by mouth daily, Flonase one to 2 sprays each nostril daily, warfarin 5 mg take one half tablet on Mondays and one full tablet on other days of the week  PHYSICIANS INVOLVED IN CARE: Creola Corn (primary care), Lajean Manes [facility] GI), Carman Ching (GI), Dr. Leanora Cover (ophthalmology), Shawnie Pons (cardiology), [name] (electrophysiology), Gaynelle Arabian (urology)  Past Surgical History  Procedure Date  . Icd Feb 2003  . Coronary artery bypass graft 1979  . Coronary artery bypass graft 1991    REDO SURGERY  . Cardiac  catheterization 2010    SEVERE LV DYSFUNCTION WITH ESTIMATED EJECTION FRACTION OF 25%  . Cholecystectomy   . Aortic valve replacement     Percutaneous AVR in March 2012 at the [facility]  . Cardioversion 02/16/2011    Procedure: CARDIOVERSION;  Surgeon: Luis Abed, MD;  Location: [facility] OR;  Service: Cardiovascular;  Laterality: N/A;  . Shoulder surgery   . Pacemaker placement     Family  History  Problem Relation Age of Onset  . Heart disease Mother 80  . Diabetes Mother 50  . Heart disease Father 59     Social History:  reports that he quit smoking about 42 years ago. His smoking use included Cigarettes. He has a 60 pack-year smoking history. He has never used smokeless tobacco. He reports that he does not drink alcohol or use illicit drugs. he is married and retired from [employer]. He quit smoking in 1977 and has a 60-pack-year history of smoking. No history of alcohol abuse.  Allergies:  Allergies  Allergen Reactions  . Penicillins Shortness Of Breath and Rash  . Ace Inhibitors     Has not tolerated in the past due to hyperkalemia  . Antihistamines, Diphenhydramine-Type Other (See Comments)    Inhibits urination  . Amiodarone      ROS: cancer/tumor, change in vision, heart attack, Heart disease, heart murmur, thyroid disease and Gastroesophageal reflux disease, chronic atrial fibrillation, pacemaker dependence  PHYSICAL EXAM: Blood pressure 137/59, pulse 62, temperature 97.6 F (36.4 C), temperature source Oral, resp. rate 18, SpO2 99.00%. In general, he is a well-nourished well-developed white man who was in no apparent distress. HEENT exam was within normal limits and he did not have a facial droop, and he has normal extraocular eye movements. Neck was supple without jugular venous distention or carotid bruit, chest was clear to auscultation, heart had a slightly irregular rhythm with a grade 2/6 aortic insufficiency murmur, abdomen had normal bowel sounds and no hepatosplenomegaly or tenderness, extremities were without cyanosis, clubbing, or edema and the bilateral pedal pulses were normal. Neurologic exam: He is alert and well oriented with normal affect, cranial nerves II through XII were normal, he had intact light touch sensation throughout, motor strength was 5 out of 5 throughout, he had bilateral normal finger to nose testing and he had  normal diadochokinesis, gait was not evaluated in the emergency room.  Results for orders placed during the hospital encounter of 12/02/11 (from the past 48 hour(s))  CBC WITH DIFFERENTIAL     Status: Normal   Collection Time   12/02/11  7:02 PM      Component Value Range Comment   WBC 7.7  4.0 - 10.5 K/uL    RBC 4.45  4.22 - 5.81 MIL/uL    Hemoglobin 13.7  13.0 - 17.0 g/dL    HCT 81.1  91.4 - 78.2 %    MCV 91.0  78.0 - 100.0 fL    MCH 30.8  26.0 - 34.0 pg    MCHC 33.8  30.0 - 36.0 g/dL    RDW 95.6  21.3 - 08.6 %    Platelets 176  150 - 400 K/uL    Neutrophils Relative 77  43 - 77 %    Neutro Abs 5.9  1.7 - 7.7 K/uL    Lymphocytes Relative 12  12 - 46 %    Lymphs Abs 1.0  0.7 - 4.0 K/uL    Monocytes Relative 7  3 - 12 %  Monocytes Absolute 0.5  0.1 - 1.0 K/uL    Eosinophils Relative 4  0 - 5 %    Eosinophils Absolute 0.3  0.0 - 0.7 K/uL    Basophils Relative 0  0 - 1 %    Basophils Absolute 0.0  0.0 - 0.1 K/uL   COMPREHENSIVE METABOLIC PANEL     Status: Abnormal   Collection Time   12/02/11  7:02 PM      Component Value Range Comment   Sodium 134 (*) 135 - 145 mEq/L    Potassium 4.3  3.5 - 5.1 mEq/L    Chloride 98  96 - 112 mEq/L    CO2 28  19 - 32 mEq/L    Glucose, Bld 122 (*) 70 - 99 mg/dL    BUN 12  6 - 23 mg/dL    Creatinine, Ser 1.61  0.50 - 1.35 mg/dL    Calcium 9.1  8.4 - 09.6 mg/dL    Total Protein 6.7  6.0 - 8.3 g/dL    Albumin 3.8  3.5 - 5.2 g/dL    AST 21  0 - 37 U/L    ALT 17  0 - 53 U/L    Alkaline Phosphatase 66  39 - 117 U/L    Total Bilirubin 0.5  0.3 - 1.2 mg/dL    GFR calc non Af Amer 70 (*) >90 mL/min    GFR calc Af Amer 81 (*) >90 mL/min   APTT     Status: Abnormal   Collection Time   12/02/11  7:02 PM      Component Value Range Comment   aPTT 49 (*) 24 - 37 seconds   PROTIME-INR     Status: Abnormal   Collection Time   12/02/11  7:02 PM      Component Value Range Comment   Prothrombin Time 26.8 (*) 11.6 - 15.2 seconds    INR 2.63 (*) 0.00 -  1.49   TROPONIN I     Status: Normal   Collection Time   12/02/11  7:02 PM      Component Value Range Comment   Troponin I <0.30  <0.30 ng/mL    Dg Chest 2 View  12/02/2011  *RADIOLOGY REPORT*  Clinical Data: Near syncope.  CHEST - 2 VIEW  Comparison: 11/28/2010  Findings: AICD in good position.  Aortic valve replacement unchanged.  Negative for heart failure.  Negative for infiltrate or effusion.  Lungs remain clear.  IMPRESSION: No acute cardiopulmonary disease.   Original Report Authenticated By: Camelia Phenes, M.D.    Ct Head Wo Contrast  12/02/2011  *RADIOLOGY REPORT*  Clinical Data: Near syncope.  Dizziness with nausea.  Unable to have MR secondary to defibrillator.  CT HEAD WITHOUT CONTRAST  Technique:  Contiguous axial images were obtained from the base of the skull through the vertex without contrast.  Comparison: None.  Findings: No intracranial hemorrhage.  Left lenticular nucleus infarct and right frontal lobe infarct of indeterminate age.  No CT evidence of large acute infarct.  Global atrophy without hydrocephalus  Small vessel disease type changes.  No intracranial mass lesion detected on this unenhanced exam.  Vascular calcifications.  Complete opacification left sphenoid sinus.  IMPRESSION:  No intracranial hemorrhage.  Left lenticular nucleus infarct and right frontal lobe infarct of indeterminate age.  No CT evidence of large acute infarct.  Global atrophy without hydrocephalus  Small vessel disease type changes.  Vascular calcifications.  Complete opacification left sphenoid sinus.   Original  Report Authenticated By: Fuller Canada, M.D.      Assessment/Plan #1 gait instability and blurred vision: This could be from vertebrobasilar insufficiency with small stroke versus transient ischemic attack versus ophthalmologic migraine versus transient positional vertigo. His symptoms appear to have resolved entirely, however I have not fully evaluated his gait due to his telemetry wires  and IV lines. He has a pacemaker and AICD in place which precludes doing an MRI exam of the brain, so we will do a CT angiogram of the neck and brain for further evaluation. We will also have a swallow evaluation, and physical therapy and occupational therapy evaluation. Lastly, we will check a carotid ultrasound exam and echocardiogram. He will remain on aspirin and Coumadin for now. #2 coronary artery disease: Stable on current medications. #3 atrial fibrillation: He has a controlled ventricular rate with stroke protection from warfarin. #4 status post aortic valve replacement: Clinically stable and his exam suggested he has a degree of aortic insufficiency.   Zayvien Canning G 12/02/2011, 11:22 PM

## 2011-12-02 NOTE — Consult Note (Signed)
Reason for Consult: Vision changes, gait changes Referring Physician: Cheri Guppy  CC: Vision changes, nausea  History is obtained from: Patient, wife  HPI: [name] is an 78 y.o. male who a couple of hours ago was sitting in his car when he noticed vision changes which he describes as bright lights around the periphery of his vision. He subsequently has noticed nausea and vomiting. Over the past 2-3 days, he has noticed worsening of his gait.   He states that this is happened a single time before, around the time he had his percutaneous mechanical valve placed. He has since that time not had a repeat, until today. He does get headaches about once or twice a week.  ROS: An 11 point ROS was performed and is negative except as noted in the HPI.  Past Medical History  Diagnosis Date  . Ischemic cardiomyopathy 05/2010    Has EF of 25%  . Diverticulitis     CURRENTLY CONTROLLED WITH NO EVIDENCE OF RECURRENT INFECTION  . AF (atrial fibrillation)     Has not tolerated amiodarone in the past. Amiodarone was stopped in September of 2010 due to side effects  . Chronic anticoagulation     on coumadin  . ICD (implantable cardiac defibrillator) in place   . Prostate cancer   . Osteoarthritis     RIGHT KNEE  . Aortic stenosis, severe     WITH PERCUTANEOUS AORTIC VALVE (TAVI) IN March 2012 at the [facility]  . MI (myocardial infarction) 1974, 79  . Cervical myelopathy   . Pulmonary embolism   . Hyperlipidemia   . GERD (gastroesophageal reflux disease)   . Esophageal stricture     WITH DILATATION  . NSVT (nonsustained ventricular tachycardia)     Family History: Mother-heart disease  Social History: Tob: Former smoker  Exam: Current vital signs: BP 149/87  Pulse 56  Temp 97.9 F (36.6 C) (Oral)  Resp 19  SpO2 98% Vital signs in last 24 hours: Temp:  [97.9 F (36.6 C)] 97.9 F (36.6 C) (09/21 1901) Pulse Rate:  [56-66] 56  (09/21 2118) Resp:  [16-19] 19   (09/21 2118) BP: (149-159)/(64-88) 149/87 mmHg (09/21 2118) SpO2:  [98 %-99 %] 98 % (09/21 2118)  General: In bed, no apparent distress CV: Regular rate and rhythm Mental Status: Patient is awake, alert, oriented to person, place, month, year, and situation. Immediate and remote memory are intact. Patient is able to give a clear and coherent history. Cranial Nerves: II: Visual Fields are full. Pupils are equal, round, and reactive to light.  Discs are difficult visualize III,IV, VI: EOMI without ptosis or diploplia.  V,VII: Facial sensation and movement are symmetric.  VIII: hearing is intact to voice X: Uvula elevates symmetrically XI: Shoulder shrug is symmetric. XII: tongue is midline without atrophy or fasciculations.  Motor: Tone is normal. Bulk is normal. 5/5 strength was present in all four extremities.  Sensory: Sensation is symmetric to light touch and temperature in the arms and legs. He has diminished vibratory sensation distally, as well as diminished temperature sensation below the mid calf Deep Tendon Reflexes: 2+ and symmetric in the biceps and patellae.  Plantars: Toes are downgoing bilaterally.  Cerebellar: FNF and HKS are intact on right, he does have mild horizontal tremor with pass pointing in his left arm and seems to have some difficulty with heel-knee-shin in his left leg as well these are very mild. Gait: Did not assess due to patient's worsening of symptoms  with arising  I have reviewed labs in epic and the results pertinent to this consultation are: INR 2.6   I have reviewed the images obtained: CT head-no hemorrhage  Impression: 78 year old male with acute onset gait disturbance, nausea, visual changes. This is concerning for posterior circulation infarct, however his INR 2.6 includes intervention. He also has a history of migraine, and this certainly could represent competent migraine as well given that the visual symptoms were positive. Do to his  worsening of symptoms including visual symptoms, with standing up I do feel that his posterior circulation needs to be evaluated with a CT angiogram of his head and neck.  Recommendations: 1. HgbA1c, fasting lipid panel 2. CTA head and neck 3. PT consult, OT consult, Speech consult 4. Echocardiogram 5. Carotid dopplers 6. Prophylactic therapy-Anticoagulation: Coumadin- dose 5mg  qhs 7. Risk factor modification 8. Telemetry monitoring 9. Frequent neuro checks     Ritta Slot, MD [facility] [phone]

## 2011-12-02 NOTE — ED Notes (Signed)
Pt presents to department for evaluation of near syncope. States he was sitting in car when he suddenly became very dizzy and nauseated. Also states recent generalized weakness. Denies pain at the time. Denies SOB. He is conscious alert and oriented x4. Skin warm and dry.

## 2011-12-03 ENCOUNTER — Observation Stay (HOSPITAL_COMMUNITY): Payer: Medicare Other

## 2011-12-03 DIAGNOSIS — G458 Other transient cerebral ischemic attacks and related syndromes: Secondary | ICD-10-CM | POA: Diagnosis not present

## 2011-12-03 DIAGNOSIS — Z5181 Encounter for therapeutic drug level monitoring: Secondary | ICD-10-CM | POA: Diagnosis not present

## 2011-12-03 DIAGNOSIS — R279 Unspecified lack of coordination: Secondary | ICD-10-CM | POA: Diagnosis not present

## 2011-12-03 DIAGNOSIS — I658 Occlusion and stenosis of other precerebral arteries: Secondary | ICD-10-CM | POA: Diagnosis not present

## 2011-12-03 DIAGNOSIS — I6350 Cerebral infarction due to unspecified occlusion or stenosis of unspecified cerebral artery: Secondary | ICD-10-CM

## 2011-12-03 DIAGNOSIS — I4891 Unspecified atrial fibrillation: Secondary | ICD-10-CM

## 2011-12-03 DIAGNOSIS — R42 Dizziness and giddiness: Secondary | ICD-10-CM | POA: Diagnosis not present

## 2011-12-03 DIAGNOSIS — I6509 Occlusion and stenosis of unspecified vertebral artery: Secondary | ICD-10-CM | POA: Diagnosis not present

## 2011-12-03 DIAGNOSIS — R112 Nausea with vomiting, unspecified: Secondary | ICD-10-CM | POA: Diagnosis not present

## 2011-12-03 DIAGNOSIS — Z7901 Long term (current) use of anticoagulants: Secondary | ICD-10-CM | POA: Diagnosis not present

## 2011-12-03 DIAGNOSIS — H538 Other visual disturbances: Secondary | ICD-10-CM | POA: Diagnosis not present

## 2011-12-03 LAB — COMPREHENSIVE METABOLIC PANEL WITH GFR
ALT: 14 U/L (ref 0–53)
AST: 18 U/L (ref 0–37)
Albumin: 3.4 g/dL — ABNORMAL LOW (ref 3.5–5.2)
Alkaline Phosphatase: 59 U/L (ref 39–117)
BUN: 9 mg/dL (ref 6–23)
CO2: 27 meq/L (ref 19–32)
Calcium: 8.8 mg/dL (ref 8.4–10.5)
Chloride: 101 meq/L (ref 96–112)
Creatinine, Ser: 0.98 mg/dL (ref 0.50–1.35)
GFR calc Af Amer: 90 mL/min — ABNORMAL LOW
GFR calc non Af Amer: 77 mL/min — ABNORMAL LOW
Glucose, Bld: 95 mg/dL (ref 70–99)
Potassium: 4.2 meq/L (ref 3.5–5.1)
Sodium: 136 meq/L (ref 135–145)
Total Bilirubin: 0.6 mg/dL (ref 0.3–1.2)
Total Protein: 5.8 g/dL — ABNORMAL LOW (ref 6.0–8.3)

## 2011-12-03 LAB — CBC
HCT: 37.5 % — ABNORMAL LOW (ref 39.0–52.0)
Hemoglobin: 12.7 g/dL — ABNORMAL LOW (ref 13.0–17.0)
MCH: 30.8 pg (ref 26.0–34.0)
MCHC: 33.9 g/dL (ref 30.0–36.0)
MCV: 91 fL (ref 78.0–100.0)
Platelets: 156 K/uL (ref 150–400)
RBC: 4.12 MIL/uL — ABNORMAL LOW (ref 4.22–5.81)
RDW: 13.7 % (ref 11.5–15.5)
WBC: 7.8 K/uL (ref 4.0–10.5)

## 2011-12-03 LAB — PROTIME-INR
INR: 2.68 — ABNORMAL HIGH (ref 0.00–1.49)
Prothrombin Time: 27.2 seconds — ABNORMAL HIGH (ref 11.6–15.2)

## 2011-12-03 LAB — HEMOGLOBIN A1C: Hgb A1c MFr Bld: 5.9 % — ABNORMAL HIGH (ref <5.7)

## 2011-12-03 LAB — GLUCOSE, CAPILLARY
Glucose-Capillary: 112 mg/dL — ABNORMAL HIGH (ref 70–99)
Glucose-Capillary: 123 mg/dL — ABNORMAL HIGH (ref 70–99)
Glucose-Capillary: 71 mg/dL (ref 70–99)
Glucose-Capillary: 97 mg/dL (ref 70–99)

## 2011-12-03 LAB — RAPID URINE DRUG SCREEN, HOSP PERFORMED
Amphetamines: NOT DETECTED
Barbiturates: NOT DETECTED
Benzodiazepines: NOT DETECTED
Cocaine: NOT DETECTED
Opiates: NOT DETECTED
Tetrahydrocannabinol: NOT DETECTED

## 2011-12-03 LAB — LIPID PANEL
Cholesterol: 129 mg/dL (ref 0–200)
VLDL: 13 mg/dL (ref 0–40)

## 2011-12-03 MED ORDER — ASPIRIN 81 MG PO CHEW: 81.0000 mg | CHEWABLE_TABLET | Freq: Every day | ORAL | Status: DC

## 2011-12-03 MED ORDER — PANTOPRAZOLE SODIUM 40 MG PO TBEC
40.0000 mg | DELAYED_RELEASE_TABLET | Freq: Every day | ORAL | Status: DC
  Administered 2011-12-03 – 2011-12-04 (×2): 40 mg via ORAL
  Filled 2011-12-03: qty 1

## 2011-12-03 MED ORDER — HYDROCODONE-ACETAMINOPHEN 5-325 MG PO TABS: 1.0000 | ORAL_TABLET | ORAL | Status: DC | PRN

## 2011-12-03 MED ORDER — ONE-DAILY MULTI VITAMINS PO TABS: 1.0000 | ORAL_TABLET | Freq: Every day | ORAL | Status: DC

## 2011-12-03 MED ORDER — FUROSEMIDE 20 MG PO TABS: 20.0000 mg | ORAL_TABLET | ORAL | Status: DC

## 2011-12-03 MED ORDER — ZOLPIDEM TARTRATE 5 MG PO TABS: 5.0000 mg | ORAL_TABLET | Freq: Every evening | ORAL | Status: DC | PRN

## 2011-12-03 MED ORDER — SODIUM CHLORIDE 0.9 % IV SOLN: INTRAVENOUS | Status: DC

## 2011-12-03 MED ORDER — DIGOXIN 0.0625 MG HALF TABLET
0.0625 mg | ORAL_TABLET | Freq: Every day | ORAL | Status: DC
  Administered 2011-12-03 – 2011-12-04 (×2): 0.0625 mg via ORAL
  Filled 2011-12-03 (×2): qty 1

## 2011-12-03 MED ORDER — LEVOTHYROXINE SODIUM 150 MCG PO TABS
150.0000 ug | ORAL_TABLET | Freq: Every day | ORAL | Status: DC
  Administered 2011-12-03 – 2011-12-04 (×2): 150 ug via ORAL
  Filled 2011-12-03 (×3): qty 1

## 2011-12-03 MED ORDER — WARFARIN - PHYSICIAN DOSING INPATIENT
Freq: Every day | Status: DC
  Administered 2011-12-03: 18:00:00

## 2011-12-03 MED ORDER — FUROSEMIDE 20 MG PO TABS
20.0000 mg | ORAL_TABLET | ORAL | Status: DC
  Administered 2011-12-03: 20 mg via ORAL
  Filled 2011-12-03: qty 1

## 2011-12-03 MED ORDER — IOHEXOL 350 MG/ML SOLN
50.0000 mL | Freq: Once | INTRAVENOUS | Status: AC | PRN
  Administered 2011-12-03: 50 mL via INTRAVENOUS

## 2011-12-03 MED ORDER — NITROGLYCERIN 0.4 MG SL SUBL: 0.4000 mg | SUBLINGUAL_TABLET | SUBLINGUAL | Status: DC | PRN

## 2011-12-03 MED ORDER — ACETAMINOPHEN 325 MG PO TABS: 650.0000 mg | ORAL_TABLET | Freq: Four times a day (QID) | ORAL | Status: DC | PRN

## 2011-12-03 MED ORDER — CARVEDILOL 12.5 MG PO TABS
12.5000 mg | ORAL_TABLET | Freq: Two times a day (BID) | ORAL | Status: DC
  Administered 2011-12-03 – 2011-12-04 (×2): 12.5 mg via ORAL
  Filled 2011-12-03 (×5): qty 1

## 2011-12-03 MED ORDER — ASPIRIN EC 81 MG PO TBEC
81.0000 mg | DELAYED_RELEASE_TABLET | Freq: Every day | ORAL | Status: DC
  Administered 2011-12-03 – 2011-12-04 (×2): 81 mg via ORAL
  Filled 2011-12-03 (×2): qty 1

## 2011-12-03 MED ORDER — ADULT MULTIVITAMIN W/MINERALS CH
1.0000 | ORAL_TABLET | Freq: Every day | ORAL | Status: DC
  Administered 2011-12-03 – 2011-12-04 (×2): 1 via ORAL
  Filled 2011-12-03 (×2): qty 1

## 2011-12-03 MED ORDER — ACETAMINOPHEN 650 MG RE SUPP: 650.0000 mg | Freq: Four times a day (QID) | RECTAL | Status: DC | PRN

## 2011-12-03 MED ORDER — POLYETHYLENE GLYCOL 3350 17 GM/SCOOP PO POWD: 17.0000 g | Freq: Every day | ORAL | Status: DC | PRN

## 2011-12-03 MED ORDER — WARFARIN SODIUM 5 MG PO TABS
5.0000 mg | ORAL_TABLET | Freq: Every evening | ORAL | Status: DC
  Administered 2011-12-03: 5 mg via ORAL
  Filled 2011-12-03 (×2): qty 1

## 2011-12-03 MED ORDER — SENNOSIDES-DOCUSATE SODIUM 8.6-50 MG PO TABS
1.0000 | ORAL_TABLET | Freq: Every evening | ORAL | Status: DC | PRN
  Filled 2011-12-03: qty 1

## 2011-12-03 MED ORDER — ATORVASTATIN CALCIUM 10 MG PO TABS
10.0000 mg | ORAL_TABLET | Freq: Every day | ORAL | Status: DC
  Administered 2011-12-03: 10 mg via ORAL
  Filled 2011-12-03 (×2): qty 1

## 2011-12-03 MED ORDER — ALFUZOSIN HCL ER 10 MG PO TB24
10.0000 mg | ORAL_TABLET | Freq: Every day | ORAL | Status: DC
  Administered 2011-12-03: 10 mg via ORAL
  Filled 2011-12-03 (×2): qty 1

## 2011-12-03 MED ORDER — PROMETHAZINE HCL 12.5 MG PO TABS: 12.5000 mg | ORAL_TABLET | Freq: Four times a day (QID) | ORAL | Status: DC | PRN

## 2011-12-03 MED ORDER — POLYETHYLENE GLYCOL 3350 17 G PO PACK
17.0000 g | PACK | Freq: Every day | ORAL | Status: DC | PRN
  Administered 2011-12-03: 17 g via ORAL
  Filled 2011-12-03: qty 1

## 2011-12-03 NOTE — Progress Notes (Signed)
Subjective: He feels fine, had some unsteadiness with PT going up and down stairs. He wants to take po meds and eat.  Objective: Vital signs in last 24 hours: Temp:  [97.4 F (36.3 C)-97.9 F (36.6 C)] 97.4 F (36.3 C) (09/22 0500) Pulse Rate:  [39-69] 69  (09/22 0506) Resp:  [14-21] 18  (09/22 0500) BP: (118-159)/(59-92) 125/67 mmHg (09/22 0506) SpO2:  [97 %-99 %] 97 % (09/22 0500) Weight:  [96.2 kg (212 lb 1.3 oz)] 96.2 kg (212 lb 1.3 oz) (09/22 0035) Weight change:    Intake/Output from previous day:     General appearance: alert, cooperative and no distress Neck: supple, symmetrical, trachea midline Resp: clear to auscultation bilaterally Cardio: irregularly irregular rhythm and with a 2/6 diastolic murmur consistent with AI Neurologic: Alert and oriented X 3, normal strength and tone. Normal symmetric reflexes. Normal coordination and gait Gait: he has very mild instability with direction changes, otherwise normal gait I did a bedside swallow test and he was able to consume water from a straw with no difficulty, no coughing, and no change in his clear lung exam.   Lab Results:  Basename 12/03/11 0640 12/02/11 1902  WBC 7.8 7.7  HGB 12.7* 13.7  HCT 37.5* 40.5  PLT 156 176   BMET  Basename 12/03/11 0640 12/02/11 1902  NA 136 134*  K 4.2 4.3  CL 101 98  CO2 27 28  GLUCOSE 95 122*  BUN 9 12  CREATININE 0.98 1.01  CALCIUM 8.8 9.1   CMET CMP     Component Value Date/Time   NA 136 12/03/2011 0640   K 4.2 12/03/2011 0640   CL 101 12/03/2011 0640   CO2 27 12/03/2011 0640   GLUCOSE 95 12/03/2011 0640   BUN 9 12/03/2011 0640   CREATININE 0.98 12/03/2011 0640   CALCIUM 8.8 12/03/2011 0640   PROT 5.8* 12/03/2011 0640   ALBUMIN 3.4* 12/03/2011 0640   AST 18 12/03/2011 0640   ALT 14 12/03/2011 0640   ALKPHOS 59 12/03/2011 0640   BILITOT 0.6 12/03/2011 0640   GFRNONAA 77* 12/03/2011 0640   GFRAA 90* 12/03/2011 0640    CBG (last 3)   Basename 12/03/11 0745 12/03/11 0032    GLUCAP 97 123*    INR RESULTS:   Lab Results  Component Value Date   INR 2.68* 12/03/2011   INR 2.63* 12/02/2011   INR 2.0 11/27/2011     Studies/Results: Ct Angio Head W/cm &/or Wo Cm  12/03/2011  *RADIOLOGY REPORT*  Clinical Data:  Sudden onset of blurred vision and nausea.  Vertigo and dizziness.  Defibrillator and cannot have MR.  CT ANGIOGRAPHY HEAD AND NECK  Technique:  Multidetector CT imaging of the head and neck was performed using the standard protocol during bolus administration of intravenous contrast.  Multiplanar CT image reconstructions including MIPs were obtained to evaluate the vascular anatomy. Carotid stenosis measurements (when applicable) are obtained utilizing NASCET criteria, using the distal internal carotid diameter as the denominator.  Contrast: 50mL OMNIPAQUE IOHEXOL 350 MG/ML SOLN  Comparison:  12/02/2011 head CT.  CTA NECK  Findings:  Atherosclerotic type changes with calcification of ectatic aortic arch.  Ascending thoracic aorta measures up to 3.6 cm.  Focal plaque proximal descending thoracic aorta.  Bilateral carotid bifurcation plaque/calcification with narrowing less than 50%.  Mild narrowing proximal right subclavian artery.  Mild narrowing proximal right vertebral artery.  Mild plaquing narrowing left subclavian artery just proximal to the takeoff of the left vertebral artery.  Very mild narrowing proximal left vertebral artery.  Mild ectasia of the vertical segment of the vertebral arteries bilaterally without high-grade stenosis.  Cervical spondylotic changes with spinal stenosis and foraminal narrowing of various degrees.  This includes C3-4 mild to moderate spinal stenosis and cord flattening, C4-5 right paracentral disc protrusion with slight cord flattening and right C5-6 disc osteophyte with right-sided spinal stenosis.  No primary neck mass or neck adenopathy.  No worrisome lung apical lesion detected.   Review of the MIP images confirms the above findings.   IMPRESSION: Bilateral carotid bifurcation plaque/calcification with narrowing less than 50%.  Mild narrowing proximal right subclavian artery.  Mild narrowing proximal right vertebral artery.  Mild plaquing narrowing left subclavian artery just proximal to the takeoff of the left vertebral artery.  Very mild narrowing proximal left vertebral artery.  CTA HEAD  Findings:  No CT evidence of large acute infarct.  Specifically, no evidence of large posterior fossa infarct.  Small vessel disease type changes.  Hypodensity frontal lobes greater on the right and left lenticular nucleus may represent result of prior infarct/ischemia.  No intracranial hemorrhage detected on contrast enhanced examination.  No intracranial enhancing lesion.  Persistent complete opacification left sphenoid sinus.  Left vertebral artery is dominant.  Calcification of the intracranial aspect of the vertebral arteries with mild to moderate narrowing greater on the left.  Poor delineation of the right PICA.  Slightly irregular narrowed left PICA.  AICAs visualized.  Slight irregularity of the basilar artery with tortuosity without high-grade stenosis.  Mild posterior cerebral artery distal branch vessel irregularity. Fetal origin of the right posterior cerebral artery.  Calcification cavernous segment of the internal carotid artery bilaterally with mild to slightly moderate narrowing.  Otherwise anterior circulation without medium or large size vessel significant stenosis or occlusion.  No aneurysm or vascular malformation noted.   Review of the MIP images confirms the above findings.  IMPRESSION: No CT evidence of large acute infarct.  Specifically, no evidence of large posterior fossa infarct.  Left vertebral artery is dominant.  Calcification of the intracranial aspect of the vertebral arteries with mild to moderate narrowing greater on the left.  Poor delineation of the right PICA.  Slightly irregular narrowed left PICA.  Calcification  cavernous segment of the internal carotid artery bilaterally with mild to slightly moderate narrowing.   Original Report Authenticated By: Fuller Canada, M.D.    Dg Chest 2 View  12/02/2011  *RADIOLOGY REPORT*  Clinical Data: Near syncope.  CHEST - 2 VIEW  Comparison: 11/28/2010  Findings: AICD in good position.  Aortic valve replacement unchanged.  Negative for heart failure.  Negative for infiltrate or effusion.  Lungs remain clear.  IMPRESSION: No acute cardiopulmonary disease.   Original Report Authenticated By: Camelia Phenes, M.D.    Ct Head Wo Contrast  12/02/2011  *RADIOLOGY REPORT*  Clinical Data: Near syncope.  Dizziness with nausea.  Unable to have MR secondary to defibrillator.  CT HEAD WITHOUT CONTRAST  Technique:  Contiguous axial images were obtained from the base of the skull through the vertex without contrast.  Comparison: None.  Findings: No intracranial hemorrhage.  Left lenticular nucleus infarct and right frontal lobe infarct of indeterminate age.  No CT evidence of large acute infarct.  Global atrophy without hydrocephalus  Small vessel disease type changes.  No intracranial mass lesion detected on this unenhanced exam.  Vascular calcifications.  Complete opacification left sphenoid sinus.  IMPRESSION:  No intracranial hemorrhage.  Left lenticular nucleus infarct  and right frontal lobe infarct of indeterminate age.  No CT evidence of large acute infarct.  Global atrophy without hydrocephalus  Small vessel disease type changes.  Vascular calcifications.  Complete opacification left sphenoid sinus.   Original Report Authenticated By: Fuller Canada, M.D.    Ct Angio Neck W/cm &/or Wo/cm  12/03/2011  *RADIOLOGY REPORT*  Clinical Data:  Sudden onset of blurred vision and nausea.  Vertigo and dizziness.  Defibrillator and cannot have MR.  CT ANGIOGRAPHY HEAD AND NECK  Technique:  Multidetector CT imaging of the head and neck was performed using the standard protocol during bolus  administration of intravenous contrast.  Multiplanar CT image reconstructions including MIPs were obtained to evaluate the vascular anatomy. Carotid stenosis measurements (when applicable) are obtained utilizing NASCET criteria, using the distal internal carotid diameter as the denominator.  Contrast: 50mL OMNIPAQUE IOHEXOL 350 MG/ML SOLN  Comparison:  12/02/2011 head CT.  CTA NECK  Findings:  Atherosclerotic type changes with calcification of ectatic aortic arch.  Ascending thoracic aorta measures up to 3.6 cm.  Focal plaque proximal descending thoracic aorta.  Bilateral carotid bifurcation plaque/calcification with narrowing less than 50%.  Mild narrowing proximal right subclavian artery.  Mild narrowing proximal right vertebral artery.  Mild plaquing narrowing left subclavian artery just proximal to the takeoff of the left vertebral artery.  Very mild narrowing proximal left vertebral artery.  Mild ectasia of the vertical segment of the vertebral arteries bilaterally without high-grade stenosis.  Cervical spondylotic changes with spinal stenosis and foraminal narrowing of various degrees.  This includes C3-4 mild to moderate spinal stenosis and cord flattening, C4-5 right paracentral disc protrusion with slight cord flattening and right C5-6 disc osteophyte with right-sided spinal stenosis.  No primary neck mass or neck adenopathy.  No worrisome lung apical lesion detected.   Review of the MIP images confirms the above findings.  IMPRESSION: Bilateral carotid bifurcation plaque/calcification with narrowing less than 50%.  Mild narrowing proximal right subclavian artery.  Mild narrowing proximal right vertebral artery.  Mild plaquing narrowing left subclavian artery just proximal to the takeoff of the left vertebral artery.  Very mild narrowing proximal left vertebral artery.  CTA HEAD  Findings:  No CT evidence of large acute infarct.  Specifically, no evidence of large posterior fossa infarct.  Small vessel  disease type changes.  Hypodensity frontal lobes greater on the right and left lenticular nucleus may represent result of prior infarct/ischemia.  No intracranial hemorrhage detected on contrast enhanced examination.  No intracranial enhancing lesion.  Persistent complete opacification left sphenoid sinus.  Left vertebral artery is dominant.  Calcification of the intracranial aspect of the vertebral arteries with mild to moderate narrowing greater on the left.  Poor delineation of the right PICA.  Slightly irregular narrowed left PICA.  AICAs visualized.  Slight irregularity of the basilar artery with tortuosity without high-grade stenosis.  Mild posterior cerebral artery distal branch vessel irregularity. Fetal origin of the right posterior cerebral artery.  Calcification cavernous segment of the internal carotid artery bilaterally with mild to slightly moderate narrowing.  Otherwise anterior circulation without medium or large size vessel significant stenosis or occlusion.  No aneurysm or vascular malformation noted.   Review of the MIP images confirms the above findings.  IMPRESSION: No CT evidence of large acute infarct.  Specifically, no evidence of large posterior fossa infarct.  Left vertebral artery is dominant.  Calcification of the intracranial aspect of the vertebral arteries with mild to moderate narrowing greater on the  left.  Poor delineation of the right PICA.  Slightly irregular narrowed left PICA.  Calcification cavernous segment of the internal carotid artery bilaterally with mild to slightly moderate narrowing.   Original Report Authenticated By: Fuller Canada, M.D.     Medications: I have reviewed the patient's current medications.  Assessment/Plan: #1 Gait Instability: nearly normalized, likely that he had a TIA rather than a small stroke. We will check results of carotid US and echo, continue PT, and likely discharge him to home tomorrow. #2 Atrial Fibrillation: stable on current  medications.   LOS: 1 day   Delina Kruczek G 12/03/2011, 1:01 PM

## 2011-12-03 NOTE — Progress Notes (Signed)
Stroke Team Progress Note  HISTORY [name] is an 78 y.o. male who a couple of hours ago was sitting in his car when he noticed vision changes which he describes as bright lights around the periphery of his vision. He subsequently has noticed nausea and vomiting. Over the past 2-3 days, he has noticed worsening of his gait.  He states that this is happened a single time before, around the time he had his percutaneous mechanical valve placed. He has since that time not had a repeat, until today. He does get headaches about once or twice a week  SUBJECTIVE His family is at the bedside.  Overall he  he his  doing well today without nausea or blurred vision but has mild gait ataxia   OBJECTIVE Most recent Vital Signs: Filed Vitals:   12/03/11 0500 12/03/11 0501 12/03/11 0503 12/03/11 0506  BP: 130/61 125/65 125/65 125/67  Pulse: 58  67 69  Temp: 97.4 F (36.3 C)     TempSrc: Oral     Resp: 18     Height:      Weight:      SpO2: 97%      CBG (last 3)   Basename 12/03/11 1211 12/03/11 0745 12/03/11 0032  GLUCAP 97 97 123*   Intake/Output from previous day:    IV Fluid Intake:     . sodium chloride    . DISCONTD: sodium chloride 1,000 mL (12/02/11 1933)    MEDICATIONS    . alfuzosin  10 mg Oral QHS  . aspirin EC  81 mg Oral Daily  . atorvastatin  10 mg Oral q1800  . carvedilol  12.5 mg Oral BID WC  . digoxin  0.0625 mg Oral Daily  . furosemide  20 mg Oral Q3 days  . levothyroxine  150 mcg Oral QAC breakfast  . multivitamin with minerals  1 tablet Oral Daily  . ondansetron (ZOFRAN) IV  4 mg Intravenous Once  . pantoprazole  40 mg Oral Q1200  . warfarin  5 mg Oral QPM  . Warfarin - Physician Dosing Inpatient   Does not apply q1800  . DISCONTD: aspirin  81 mg Oral Daily  . DISCONTD: furosemide  20 mg Oral Q3 days  . DISCONTD: furosemide  20 mg Oral Q3 days  . DISCONTD: multivitamin  1 tablet Oral Daily   PRN:  acetaminophen, acetaminophen, HYDROcodone-acetaminophen,  iohexol, nitroGLYCERIN, polyethylene glycol, promethazine, senna-docusate, zolpidem, DISCONTD: nitroGLYCERIN, DISCONTD: polyethylene glycol powder  Diet:   Activity:   Up with assistance DVT Prophylaxis:  SCds  CLINICALLY SIGNIFICANT STUDIES Basic Metabolic Panel:  Lab 12/03/11 7846 12/02/11 1902  NA 136 134*  K 4.2 4.3  CL 101 98  CO2 27 28  GLUCOSE 95 122*  BUN 9 12  CREATININE 0.98 1.01  CALCIUM 8.8 9.1  MG -- --  PHOS -- --   Liver Function Tests:  Lab 12/03/11 0640 12/02/11 1902  AST 18 21  ALT 14 17  ALKPHOS 59 66  BILITOT 0.6 0.5  PROT 5.8* 6.7  ALBUMIN 3.4* 3.8   CBC:  Lab 12/03/11 0640 12/02/11 1902  WBC 7.8 7.7  NEUTROABS -- 5.9  HGB 12.7* 13.7  HCT 37.5* 40.5  MCV 91.0 91.0  PLT 156 176   Coagulation:  Lab 12/03/11 0640 12/02/11 1902 11/27/11  LABPROT 27.2* 26.8* --  INR 2.68* 2.63* 2.0   Cardiac Enzymes:  Lab 12/02/11 1902  CKTOTAL --  CKMB --  CKMBINDEX --  TROPONINI <0.30  Urinalysis: No results found for this basename: COLORURINE:2,APPERANCEUR:2,LABSPEC:2,PHURINE:2,GLUCOSEU:2,HGBUR:2,BILIRUBINUR:2,KETONESUR:2,PROTEINUR:2,UROBILINOGEN:2,NITRITE:2,LEUKOCYTESUR:2 in the last 168 hours Lipid Panel    Component Value Date/Time   CHOL 129 12/03/2011 0640   TRIG 63 12/03/2011 0640   HDL 42 12/03/2011 0640   CHOLHDL 3.1 12/03/2011 0640   VLDL 13 12/03/2011 0640   LDLCALC 74 12/03/2011 0640   HgbA1C  No results found for this basename: HGBA1C    Urine Drug Screen:     Component Value Date/Time   LABOPIA NONE DETECTED 12/03/2011 0120   COCAINSCRNUR NONE DETECTED 12/03/2011 0120   LABBENZ NONE DETECTED 12/03/2011 0120   AMPHETMU NONE DETECTED 12/03/2011 0120   THCU NONE DETECTED 12/03/2011 0120   LABBARB NONE DETECTED 12/03/2011 0120    Alcohol Level: No results found for this basename: ETH:2 in the last 168 hours  Ct Angio Head W/cm &/or Wo Cm  12/03/2011  *RADIOLOGY REPORT*  Clinical Data:  Sudden onset of blurred vision and nausea.  Vertigo and  dizziness.  Defibrillator and cannot have MR.  CT ANGIOGRAPHY HEAD AND NECK  Technique:  Multidetector CT imaging of the head and neck was performed using the standard protocol during bolus administration of intravenous contrast.  Multiplanar CT image reconstructions including MIPs were obtained to evaluate the vascular anatomy. Carotid stenosis measurements (when applicable) are obtained utilizing NASCET criteria, using the distal internal carotid diameter as the denominator.  Contrast: 50mL OMNIPAQUE IOHEXOL 350 MG/ML SOLN  Comparison:  12/02/2011 head CT.  CTA NECK  Findings:  Atherosclerotic type changes with calcification of ectatic aortic arch.  Ascending thoracic aorta measures up to 3.6 cm.  Focal plaque proximal descending thoracic aorta.  Bilateral carotid bifurcation plaque/calcification with narrowing less than 50%.  Mild narrowing proximal right subclavian artery.  Mild narrowing proximal right vertebral artery.  Mild plaquing narrowing left subclavian artery just proximal to the takeoff of the left vertebral artery.  Very mild narrowing proximal left vertebral artery.  Mild ectasia of the vertical segment of the vertebral arteries bilaterally without high-grade stenosis.  Cervical spondylotic changes with spinal stenosis and foraminal narrowing of various degrees.  This includes C3-4 mild to moderate spinal stenosis and cord flattening, C4-5 right paracentral disc protrusion with slight cord flattening and right C5-6 disc osteophyte with right-sided spinal stenosis.  No primary neck mass or neck adenopathy.  No worrisome lung apical lesion detected.   Review of the MIP images confirms the above findings.  IMPRESSION: Bilateral carotid bifurcation plaque/calcification with narrowing less than 50%.  Mild narrowing proximal right subclavian artery.  Mild narrowing proximal right vertebral artery.  Mild plaquing narrowing left subclavian artery just proximal to the takeoff of the left vertebral artery.   Very mild narrowing proximal left vertebral artery.  CTA HEAD  Findings:  No CT evidence of large acute infarct.  Specifically, no evidence of large posterior fossa infarct.  Small vessel disease type changes.  Hypodensity frontal lobes greater on the right and left lenticular nucleus may represent result of prior infarct/ischemia.  No intracranial hemorrhage detected on contrast enhanced examination.  No intracranial enhancing lesion.  Persistent complete opacification left sphenoid sinus.  Left vertebral artery is dominant.  Calcification of the intracranial aspect of the vertebral arteries with mild to moderate narrowing greater on the left.  Poor delineation of the right PICA.  Slightly irregular narrowed left PICA.  AICAs visualized.  Slight irregularity of the basilar artery with tortuosity without high-grade stenosis.  Mild posterior cerebral artery distal branch vessel irregularity. Fetal origin of the right  posterior cerebral artery.  Calcification cavernous segment of the internal carotid artery bilaterally with mild to slightly moderate narrowing.  Otherwise anterior circulation without medium or large size vessel significant stenosis or occlusion.  No aneurysm or vascular malformation noted.   Review of the MIP images confirms the above findings.  IMPRESSION: No CT evidence of large acute infarct.  Specifically, no evidence of large posterior fossa infarct.  Left vertebral artery is dominant.  Calcification of the intracranial aspect of the vertebral arteries with mild to moderate narrowing greater on the left.  Poor delineation of the right PICA.  Slightly irregular narrowed left PICA.  Calcification cavernous segment of the internal carotid artery bilaterally with mild to slightly moderate narrowing.   Original Report Authenticated By: Fuller Canada, M.D.    Dg Chest 2 View  12/02/2011  *RADIOLOGY REPORT*  Clinical Data: Near syncope.  CHEST - 2 VIEW  Comparison: 11/28/2010  Findings: AICD in good  position.  Aortic valve replacement unchanged.  Negative for heart failure.  Negative for infiltrate or effusion.  Lungs remain clear.  IMPRESSION: No acute cardiopulmonary disease.   Original Report Authenticated By: Camelia Phenes, M.D.    Ct Head Wo Contrast  12/02/2011  *RADIOLOGY REPORT*  Clinical Data: Near syncope.  Dizziness with nausea.  Unable to have MR secondary to defibrillator.  CT HEAD WITHOUT CONTRAST  Technique:  Contiguous axial images were obtained from the base of the skull through the vertex without contrast.  Comparison: None.  Findings: No intracranial hemorrhage.  Left lenticular nucleus infarct and right frontal lobe infarct of indeterminate age.  No CT evidence of large acute infarct.  Global atrophy without hydrocephalus  Small vessel disease type changes.  No intracranial mass lesion detected on this unenhanced exam.  Vascular calcifications.  Complete opacification left sphenoid sinus.  IMPRESSION:  No intracranial hemorrhage.  Left lenticular nucleus infarct and right frontal lobe infarct of indeterminate age.  No CT evidence of large acute infarct.  Global atrophy without hydrocephalus  Small vessel disease type changes.  Vascular calcifications.  Complete opacification left sphenoid sinus.   Original Report Authenticated By: Fuller Canada, M.D.    Ct Angio Neck W/cm &/or Wo/cm  12/03/2011  *RADIOLOGY REPORT*  Clinical Data:  Sudden onset of blurred vision and nausea.  Vertigo and dizziness.  Defibrillator and cannot have MR.  CT ANGIOGRAPHY HEAD AND NECK  Technique:  Multidetector CT imaging of the head and neck was performed using the standard protocol during bolus administration of intravenous contrast.  Multiplanar CT image reconstructions including MIPs were obtained to evaluate the vascular anatomy. Carotid stenosis measurements (when applicable) are obtained utilizing NASCET criteria, using the distal internal carotid diameter as the denominator.  Contrast: 50mL  OMNIPAQUE IOHEXOL 350 MG/ML SOLN  Comparison:  12/02/2011 head CT.  CTA NECK  Findings:  Atherosclerotic type changes with calcification of ectatic aortic arch.  Ascending thoracic aorta measures up to 3.6 cm.  Focal plaque proximal descending thoracic aorta.  Bilateral carotid bifurcation plaque/calcification with narrowing less than 50%.  Mild narrowing proximal right subclavian artery.  Mild narrowing proximal right vertebral artery.  Mild plaquing narrowing left subclavian artery just proximal to the takeoff of the left vertebral artery.  Very mild narrowing proximal left vertebral artery.  Mild ectasia of the vertical segment of the vertebral arteries bilaterally without high-grade stenosis.  Cervical spondylotic changes with spinal stenosis and foraminal narrowing of various degrees.  This includes C3-4 mild to moderate spinal stenosis and  cord flattening, C4-5 right paracentral disc protrusion with slight cord flattening and right C5-6 disc osteophyte with right-sided spinal stenosis.  No primary neck mass or neck adenopathy.  No worrisome lung apical lesion detected.   Review of the MIP images confirms the above findings.  IMPRESSION: Bilateral carotid bifurcation plaque/calcification with narrowing less than 50%.  Mild narrowing proximal right subclavian artery.  Mild narrowing proximal right vertebral artery.  Mild plaquing narrowing left subclavian artery just proximal to the takeoff of the left vertebral artery.  Very mild narrowing proximal left vertebral artery.  CTA HEAD  Findings:  No CT evidence of large acute infarct.  Specifically, no evidence of large posterior fossa infarct.  Small vessel disease type changes.  Hypodensity frontal lobes greater on the right and left lenticular nucleus may represent result of prior infarct/ischemia.  No intracranial hemorrhage detected on contrast enhanced examination.  No intracranial enhancing lesion.  Persistent complete opacification left sphenoid sinus.   Left vertebral artery is dominant.  Calcification of the intracranial aspect of the vertebral arteries with mild to moderate narrowing greater on the left.  Poor delineation of the right PICA.  Slightly irregular narrowed left PICA.  AICAs visualized.  Slight irregularity of the basilar artery with tortuosity without high-grade stenosis.  Mild posterior cerebral artery distal branch vessel irregularity. Fetal origin of the right posterior cerebral artery.  Calcification cavernous segment of the internal carotid artery bilaterally with mild to slightly moderate narrowing.  Otherwise anterior circulation without medium or large size vessel significant stenosis or occlusion.  No aneurysm or vascular malformation noted.   Review of the MIP images confirms the above findings.  IMPRESSION: No CT evidence of large acute infarct.  Specifically, no evidence of large posterior fossa infarct.  Left vertebral artery is dominant.  Calcification of the intracranial aspect of the vertebral arteries with mild to moderate narrowing greater on the left.  Poor delineation of the right PICA.  Slightly irregular narrowed left PICA.  Calcification cavernous segment of the internal carotid artery bilaterally with mild to slightly moderate narrowing.   Original Report Authenticated By: Fuller Canada, M.D.       MRI of the bn- no due to pacer  2D Echocardiogram pending  Carotid Doppler  No stenosis   Therapy Recommendations pending  Physical Exam  Pleasant elderly caucasian male in no distress.Awake alert. Afebrile. Head is nontraumatic. Neck is supple without bruit. Hearing is normal. Cardiac exam no murmur or gallop. Lungs are clear to auscultation. Distal pulses are well felt.  Neurological Exam : Awake  Alert oriented x 3. Normal speech and language.eye movements full without nystagmus.Fundi not visualized. Vision acuity is normal. Face symmetric. Tongue midline. Normal strength, tone, reflexes and coordination. Normal  sensation. Gait broad based slightly ataxic ASSESSMENT Mr. DAREY HERSHBERGER is a 78 y.o. male presenting with  blurred vision, nausea, vertigo and gait ataxia. Likely brainstem TIA/small infarct not seen on CT. Cannot do MRI due to defibrillator.etiology likely atrial fibrillation. On warfarin with optimal INR 2.6 on admission.Also has artificial heart valve.} prior to admission. }  Hospital day # 1  TREATMENT/PLAN  Continue warfarin for stroke prevention incase indicated for mechanical heart valve but incase he has bioprosthetic valve consider switching to pradaxa/eliquis or xarelto due to superiority over warfarin in atrial fibrillation D/w patient, family and Dr Rickard Patience, MD Medical Director [facility] Pager: [phone]  12/03/2011 3:31 PM

## 2011-12-03 NOTE — Evaluation (Signed)
Physical Therapy Evaluation Patient Details Name: [name] MRN: [id number] DOB: 10/23/1932 Today's Date: 12/03/2011 Time: 1324-4010 PT Time Calculation (min): 24 min  PT Assessment / Plan / Recommendation Clinical Impression  Patient is a 78 yo male admitted with gait instability, dizziness, nausea, and vision disturbances.  Currently all symptoms have resolved except gait instability.  Patient with good strength in bil. LE's, but gait that is uncoordinated with decreased balance.  Patient scored 19/24 on DGI balance assessment (score of 19 or lower indicates fall risk).  Patient will benefit from acute PT for gait/balance training and education.  Recommend f/u OP PT for balance therapy at discharge.    PT Assessment  Patient needs continued PT services    Follow Up Recommendations  Outpatient PT;Supervision for mobility/OOB    Barriers to Discharge None      Equipment Recommendations  Cane (possibly - will try at next session)    Recommendations for Other Services     Frequency Min 4X/week    Precautions / Restrictions Precautions Precautions: Fall Restrictions Weight Bearing Restrictions: No         Mobility  Bed Mobility Bed Mobility: Supine to Sit Supine to Sit: 7: Independent Details for Bed Mobility Assistance: No cues or assist needed Transfers Transfers: Sit to Stand;Stand to Sit Sit to Stand: 5: Supervision;With upper extremity assist;From bed Stand to Sit: 5: Supervision;With upper extremity assist;With armrests;To chair/3-in-1 Details for Transfer Assistance: Cues for hand placement.  Assist for balance/safety. Ambulation/Gait Ambulation/Gait Assistance: 4: Min guard Ambulation Distance (Feet): 230 Feet Assistive device: None Ambulation/Gait Assistance Details: Patient with staggering gait at times, requiring assist for balance.  Gait is uncoordinated. Gait Pattern: Step-through pattern;Ataxic (Staggering at times) Gait velocity: WFL Stairs:  Yes Stairs Assistance: 4: Min guard Stairs Assistance Details (indicate cue type and reason): Attempted stairs without use of rail - patient unable to do this safely.  Instructed patient to use rail on steps at home. Stair Management Technique: One rail Right;Alternating pattern;Forwards (Attempted without rail - unsuccessful) Number of Stairs: 10  Modified Rankin (Stroke Patients Only) Pre-Morbid Rankin Score: No symptoms Modified Rankin: Slight disability    Exercises Other Exercises Other Exercises: Shallow squats with feet parallel 5 reps Other Exercises: Shallow squats with right foot forward 5 reps Other Exercises: Shallow squats with left foot forward 5 reps   PT Diagnosis: Abnormality of gait  PT Problem List: Decreased balance;Decreased mobility PT Treatment Interventions: Gait training;Balance training;Patient/family education   PT Goals Acute Rehab PT Goals PT Goal Formulation: With patient Time For Goal Achievement: 12/10/11 Potential to Achieve Goals: Good Pt will go Sit to Stand: with modified independence PT Goal: Sit to Stand - Progress: Goal set today Pt will go Stand to Sit: with modified independence PT Goal: Stand to Sit - Progress: Goal set today Pt will Ambulate: >150 feet;with modified independence;with least restrictive assistive device (without loss of balance) PT Goal: Ambulate - Progress: Goal set today Pt will Go Up / Down Stairs: 6-9 stairs;with supervision;with rail(s) PT Goal: Up/Down Stairs - Progress: Goal set today Additional Goals Additional Goal #1: Patient will score at least 22/24 on DGI balance assessment. PT Goal: Additional Goal #1 - Progress: Goal set today  Visit Information  Last PT Received On: 12/03/11 Assistance Needed: +1    Subjective Data  Subjective: "My legs are just really shaky" Patient Stated Goal: To improve balance   Prior Functioning  Home Living Lives With: Spouse Available Help at Discharge: Family;Available 24  hours/day  Type of Home: House Home Access: Stairs to enter [employer] of Steps: 2 Entrance Stairs-Rails: None Home Layout: Two level;Able to live on main level with bedroom/bathroom Bathroom Shower/Tub: Engineer, manufacturing systems: Standard Home Adaptive Equipment: None Prior Function Level of Independence: Independent Able to Take Stairs?: Yes Driving: Yes Vocation: Retired Musician: No difficulties    Cognition  Overall Cognitive Status: Appears within functional limits for tasks assessed/performed Arousal/Alertness: Awake/alert Orientation Level: Oriented X4 / Intact Behavior During Session: WFL for tasks performed    Extremity/Trunk Assessment Right Upper Extremity Assessment RUE ROM/Strength/Tone: Within functional levels RUE Sensation: WFL - Light Touch Left Upper Extremity Assessment LUE ROM/Strength/Tone: Within functional levels LUE Sensation: WFL - Light Touch Right Lower Extremity Assessment RLE ROM/Strength/Tone: Within functional levels RLE Sensation: WFL - Light Touch RLE Coordination: Deficits RLE Coordination Deficits: Decreased heel-to-shin Left Lower Extremity Assessment LLE ROM/Strength/Tone: Within functional levels LLE Sensation: WFL - Light Touch LLE Coordination: Deficits LLE Coordination Deficits: Decreased heel-to-shin Trunk Assessment Trunk Assessment: Normal   Balance Balance Balance Assessed: Yes Standardized Balance Assessment Standardized Balance Assessment: Dynamic Gait Index Dynamic Gait Index Level Surface: Mild Impairment Change in Gait Speed: Mild Impairment Gait with Horizontal Head Turns: Normal Gait with Vertical Head Turns: Normal Gait and Pivot Turn: Mild Impairment Step Over Obstacle: Normal Step Around Obstacles: Mild Impairment Steps: Mild Impairment Total Score: 19   End of Session PT - End of Session Equipment Utilized During Treatment: Gait belt Activity Tolerance: Patient  tolerated treatment well Patient left: in chair;with call bell/phone within reach Nurse Communication: Mobility status (Encouraged patient to ambulate in hallway with assistance)  GP Functional Assessment Tool Used: DGI balance assessment; Clinical judgement Functional Limitation: Mobility: Walking and moving around Mobility: Walking and Moving Around Current Status ([id number]): At least 1 percent but less than 20 percent impaired, limited or restricted Mobility: Walking and Moving Around Goal Status [phone]): 0 percent impaired, limited or restricted   Vena Austria 12/03/2011, 3:04 PM Durenda Hurt. Renaldo Fiddler, [facility] Acute Rehab Services Pager [phone]

## 2011-12-03 NOTE — Progress Notes (Signed)
  Echocardiogram 2D Echocardiogram has been performed.  [name] FRANCES 12/03/2011, 11:28 AM

## 2011-12-03 NOTE — Progress Notes (Signed)
*  PRELIMINARY RESULTS* Vascular Ultrasound Carotid Duplex (Doppler) has been completed.   No obvious evidence of significant internal carotid artery stenosis. Vertebral arteries are patent with antegrade flow.  12/03/2011 2:07 PM Gertie Fey, RDMS, RDCS

## 2011-12-04 ENCOUNTER — Encounter: Payer: Self-pay | Admitting: Cardiology

## 2011-12-04 ENCOUNTER — Telehealth: Payer: Self-pay | Admitting: Cardiology

## 2011-12-04 DIAGNOSIS — Z7901 Long term (current) use of anticoagulants: Secondary | ICD-10-CM | POA: Diagnosis not present

## 2011-12-04 DIAGNOSIS — Z5181 Encounter for therapeutic drug level monitoring: Secondary | ICD-10-CM

## 2011-12-04 DIAGNOSIS — R112 Nausea with vomiting, unspecified: Secondary | ICD-10-CM | POA: Diagnosis not present

## 2011-12-04 DIAGNOSIS — R279 Unspecified lack of coordination: Secondary | ICD-10-CM | POA: Diagnosis not present

## 2011-12-04 DIAGNOSIS — I4891 Unspecified atrial fibrillation: Secondary | ICD-10-CM | POA: Diagnosis not present

## 2011-12-04 DIAGNOSIS — R42 Dizziness and giddiness: Secondary | ICD-10-CM | POA: Diagnosis not present

## 2011-12-04 DIAGNOSIS — I359 Nonrheumatic aortic valve disorder, unspecified: Secondary | ICD-10-CM | POA: Diagnosis not present

## 2011-12-04 DIAGNOSIS — I6350 Cerebral infarction due to unspecified occlusion or stenosis of unspecified cerebral artery: Secondary | ICD-10-CM | POA: Diagnosis not present

## 2011-12-04 LAB — PROTIME-INR: Prothrombin Time: 24.2 seconds — ABNORMAL HIGH (ref 11.6–15.2)

## 2011-12-04 LAB — GLUCOSE, CAPILLARY: Glucose-Capillary: 103 mg/dL — ABNORMAL HIGH (ref 70–99)

## 2011-12-04 MED ORDER — HYDROCODONE-ACETAMINOPHEN 5-325 MG PO TABS: 1.0000 | ORAL_TABLET | ORAL | Status: DC | PRN

## 2011-12-04 NOTE — Progress Notes (Signed)
Reviewed discharge instructions with patient and daughter, they stated their understanding.  Spoke with [facility], patient self monitors INR checks at home and reports to company.  Confirmed this with patient.  Patient discharged via wheelchair home with daughter.  [name]

## 2011-12-04 NOTE — Progress Notes (Signed)
Subjective: Admitted with Dizzy/Nausea/Vertigo.Marland Kitchen ?TIA.  Visual and gait Changes. Sxs resolved and would be ready for D/C if Cards does not want to pursue more testing. PT recommended balance training. No New Complaints  Objective: Vital signs in last 24 hours: Temp:  [97.8 F (36.6 C)-98.4 F (36.9 C)] 98.4 F (36.9 C) (09/23 0500) Pulse Rate:  [57-91] 91  (09/23 0506) Resp:  [18-20] 18  (09/23 0500) BP: (93-139)/(54-74) 102/57 mmHg (09/23 0506) SpO2:  [94 %-97 %] 95 % (09/23 0500) Weight change:  Last BM Date: 12/03/11  CBG (last 3)   Basename 12/04/11 0710 12/03/11 2101 12/03/11 1658  GLUCAP 103* 71 112*    Intake/Output from previous day: No intake or output data in the 24 hours ending 12/04/11 0759     Physical Exam  General appearance: Alert and oriented. Eyes: no scleral icterus Throat: oropharynx moist without erythema Resp: CTA  Cardio: Reg, sternotomy, AICD GI: soft, non-tender; bowel sounds normal; no masses,  no organomegaly Extremities: no clubbing, cyanosis or edema   Lab Results:  [facility] 12/03/11 0640 12/02/11 1902  NA 136 134*  K 4.2 4.3  CL 101 98  CO2 27 28  GLUCOSE 95 122*  BUN 9 12  CREATININE 0.98 1.01  CALCIUM 8.8 9.1  MG -- --  PHOS -- --     Basename 12/03/11 0640 12/02/11 1902  AST 18 21  ALT 14 17  ALKPHOS 59 66  BILITOT 0.6 0.5  PROT 5.8* 6.7  ALBUMIN 3.4* 3.8     Basename 12/03/11 0640 12/02/11 1902  WBC 7.8 7.7  NEUTROABS -- 5.9  HGB 12.7* 13.7  HCT 37.5* 40.5  MCV 91.0 91.0  PLT 156 176    Lab Results  Component Value Date   INR 2.29* 12/04/2011   INR 2.68* 12/03/2011   INR 2.63* 12/02/2011     Basename 12/02/11 1902  CKTOTAL --  CKMB --  CKMBINDEX --  TROPONINI <0.30    No results found for this basename: TSH,T4TOTAL,FREET3,T3FREE,THYROIDAB in the last 72 hours  No results found for this basename: VITAMINB12:2,FOLATE:2,FERRITIN:2,TIBC:2,IRON:2,RETICCTPCT:2 in the last 72 hours  Micro  Results: No results found for this or any previous visit (from the past 240 hour(s)).   Studies/Results: Ct Angio Head W/cm &/or Wo Cm  12/03/2011  *RADIOLOGY REPORT*  Clinical Data:  Sudden onset of blurred vision and nausea.  Vertigo and dizziness.  Defibrillator and cannot have MR.  CT ANGIOGRAPHY HEAD AND NECK  Technique:  Multidetector CT imaging of the head and neck was performed using the standard protocol during bolus administration of intravenous contrast.  Multiplanar CT image reconstructions including MIPs were obtained to evaluate the vascular anatomy. Carotid stenosis measurements (when applicable) are obtained utilizing NASCET criteria, using the distal internal carotid diameter as the denominator.  Contrast: 50mL OMNIPAQUE IOHEXOL 350 MG/ML SOLN  Comparison:  12/02/2011 head CT.  CTA NECK  Findings:  Atherosclerotic type changes with calcification of ectatic aortic arch.  Ascending thoracic aorta measures up to 3.6 cm.  Focal plaque proximal descending thoracic aorta.  Bilateral carotid bifurcation plaque/calcification with narrowing less than 50%.  Mild narrowing proximal right subclavian artery.  Mild narrowing proximal right vertebral artery.  Mild plaquing narrowing left subclavian artery just proximal to the takeoff of the left vertebral artery.  Very mild narrowing proximal left vertebral artery.  Mild ectasia of the vertical segment of the vertebral arteries bilaterally without high-grade stenosis.  Cervical spondylotic changes with spinal stenosis and foraminal narrowing of various  degrees.  This includes C3-4 mild to moderate spinal stenosis and cord flattening, C4-5 right paracentral disc protrusion with slight cord flattening and right C5-6 disc osteophyte with right-sided spinal stenosis.  No primary neck mass or neck adenopathy.  No worrisome lung apical lesion detected.   Review of the MIP images confirms the above findings.  IMPRESSION: Bilateral carotid bifurcation  plaque/calcification with narrowing less than 50%.  Mild narrowing proximal right subclavian artery.  Mild narrowing proximal right vertebral artery.  Mild plaquing narrowing left subclavian artery just proximal to the takeoff of the left vertebral artery.  Very mild narrowing proximal left vertebral artery.  CTA HEAD  Findings:  No CT evidence of large acute infarct.  Specifically, no evidence of large posterior fossa infarct.  Small vessel disease type changes.  Hypodensity frontal lobes greater on the right and left lenticular nucleus may represent result of prior infarct/ischemia.  No intracranial hemorrhage detected on contrast enhanced examination.  No intracranial enhancing lesion.  Persistent complete opacification left sphenoid sinus.  Left vertebral artery is dominant.  Calcification of the intracranial aspect of the vertebral arteries with mild to moderate narrowing greater on the left.  Poor delineation of the right PICA.  Slightly irregular narrowed left PICA.  AICAs visualized.  Slight irregularity of the basilar artery with tortuosity without high-grade stenosis.  Mild posterior cerebral artery distal branch vessel irregularity. Fetal origin of the right posterior cerebral artery.  Calcification cavernous segment of the internal carotid artery bilaterally with mild to slightly moderate narrowing.  Otherwise anterior circulation without medium or large size vessel significant stenosis or occlusion.  No aneurysm or vascular malformation noted.   Review of the MIP images confirms the above findings.  IMPRESSION: No CT evidence of large acute infarct.  Specifically, no evidence of large posterior fossa infarct.  Left vertebral artery is dominant.  Calcification of the intracranial aspect of the vertebral arteries with mild to moderate narrowing greater on the left.  Poor delineation of the right PICA.  Slightly irregular narrowed left PICA.  Calcification cavernous segment of the internal carotid artery  bilaterally with mild to slightly moderate narrowing.   Original Report Authenticated By: Fuller Canada, M.D.    Dg Chest 2 View  12/02/2011  *RADIOLOGY REPORT*  Clinical Data: Near syncope.  CHEST - 2 VIEW  Comparison: 11/28/2010  Findings: AICD in good position.  Aortic valve replacement unchanged.  Negative for heart failure.  Negative for infiltrate or effusion.  Lungs remain clear.  IMPRESSION: No acute cardiopulmonary disease.   Original Report Authenticated By: Camelia Phenes, M.D.    Ct Head Wo Contrast  12/02/2011  *RADIOLOGY REPORT*  Clinical Data: Near syncope.  Dizziness with nausea.  Unable to have MR secondary to defibrillator.  CT HEAD WITHOUT CONTRAST  Technique:  Contiguous axial images were obtained from the base of the skull through the vertex without contrast.  Comparison: None.  Findings: No intracranial hemorrhage.  Left lenticular nucleus infarct and right frontal lobe infarct of indeterminate age.  No CT evidence of large acute infarct.  Global atrophy without hydrocephalus  Small vessel disease type changes.  No intracranial mass lesion detected on this unenhanced exam.  Vascular calcifications.  Complete opacification left sphenoid sinus.  IMPRESSION:  No intracranial hemorrhage.  Left lenticular nucleus infarct and right frontal lobe infarct of indeterminate age.  No CT evidence of large acute infarct.  Global atrophy without hydrocephalus  Small vessel disease type changes.  Vascular calcifications.  Complete opacification left  sphenoid sinus.   Original Report Authenticated By: Fuller Canada, M.D.    Ct Angio Neck W/cm &/or Wo/cm  12/03/2011  *RADIOLOGY REPORT*  Clinical Data:  Sudden onset of blurred vision and nausea.  Vertigo and dizziness.  Defibrillator and cannot have MR.  CT ANGIOGRAPHY HEAD AND NECK  Technique:  Multidetector CT imaging of the head and neck was performed using the standard protocol during bolus administration of intravenous contrast.  Multiplanar CT  image reconstructions including MIPs were obtained to evaluate the vascular anatomy. Carotid stenosis measurements (when applicable) are obtained utilizing NASCET criteria, using the distal internal carotid diameter as the denominator.  Contrast: 50mL OMNIPAQUE IOHEXOL 350 MG/ML SOLN  Comparison:  12/02/2011 head CT.  CTA NECK  Findings:  Atherosclerotic type changes with calcification of ectatic aortic arch.  Ascending thoracic aorta measures up to 3.6 cm.  Focal plaque proximal descending thoracic aorta.  Bilateral carotid bifurcation plaque/calcification with narrowing less than 50%.  Mild narrowing proximal right subclavian artery.  Mild narrowing proximal right vertebral artery.  Mild plaquing narrowing left subclavian artery just proximal to the takeoff of the left vertebral artery.  Very mild narrowing proximal left vertebral artery.  Mild ectasia of the vertical segment of the vertebral arteries bilaterally without high-grade stenosis.  Cervical spondylotic changes with spinal stenosis and foraminal narrowing of various degrees.  This includes C3-4 mild to moderate spinal stenosis and cord flattening, C4-5 right paracentral disc protrusion with slight cord flattening and right C5-6 disc osteophyte with right-sided spinal stenosis.  No primary neck mass or neck adenopathy.  No worrisome lung apical lesion detected.   Review of the MIP images confirms the above findings.  IMPRESSION: Bilateral carotid bifurcation plaque/calcification with narrowing less than 50%.  Mild narrowing proximal right subclavian artery.  Mild narrowing proximal right vertebral artery.  Mild plaquing narrowing left subclavian artery just proximal to the takeoff of the left vertebral artery.  Very mild narrowing proximal left vertebral artery.  CTA HEAD  Findings:  No CT evidence of large acute infarct.  Specifically, no evidence of large posterior fossa infarct.  Small vessel disease type changes.  Hypodensity frontal lobes greater on  the right and left lenticular nucleus may represent result of prior infarct/ischemia.  No intracranial hemorrhage detected on contrast enhanced examination.  No intracranial enhancing lesion.  Persistent complete opacification left sphenoid sinus.  Left vertebral artery is dominant.  Calcification of the intracranial aspect of the vertebral arteries with mild to moderate narrowing greater on the left.  Poor delineation of the right PICA.  Slightly irregular narrowed left PICA.  AICAs visualized.  Slight irregularity of the basilar artery with tortuosity without high-grade stenosis.  Mild posterior cerebral artery distal branch vessel irregularity. Fetal origin of the right posterior cerebral artery.  Calcification cavernous segment of the internal carotid artery bilaterally with mild to slightly moderate narrowing.  Otherwise anterior circulation without medium or large size vessel significant stenosis or occlusion.  No aneurysm or vascular malformation noted.   Review of the MIP images confirms the above findings.  IMPRESSION: No CT evidence of large acute infarct.  Specifically, no evidence of large posterior fossa infarct.  Left vertebral artery is dominant.  Calcification of the intracranial aspect of the vertebral arteries with mild to moderate narrowing greater on the left.  Poor delineation of the right PICA.  Slightly irregular narrowed left PICA.  Calcification cavernous segment of the internal carotid artery bilaterally with mild to slightly moderate narrowing.   Original Report  Authenticated By: Fuller Canada, M.D.      Medications: Scheduled:   . alfuzosin  10 mg Oral QHS  . aspirin EC  81 mg Oral Daily  . atorvastatin  10 mg Oral q1800  . carvedilol  12.5 mg Oral BID WC  . digoxin  0.0625 mg Oral Daily  . furosemide  20 mg Oral Q3 days  . levothyroxine  150 mcg Oral QAC breakfast  . multivitamin with minerals  1 tablet Oral Daily  . pantoprazole  40 mg Oral Q1200  . warfarin  5 mg Oral  QPM  . Warfarin - Physician Dosing Inpatient   Does not apply q1800   Continuous:   . sodium chloride       Assessment/Plan: Principal Problem:  *Gait instability Active Problems:  Atrial fibrillation  S/P aortic valve replacement  Ischemic cardiomyopathy  Atrial fibrillation, chronic  Anticoagulated on Coumadin  Dizzy/Nausea/Vertigo/Visual and gait ataxia Changes c/w Likely brainstem TIA vrs small infarct not seen on CT.  Cannot do MRI due to defibrillator.  On ASA and warfarin with Therapeutic INR.  Possible etiology AFib vrs small Vessel Dz.    CCT/CTA: No CT evidence of large acute infarct.  Specifically, no evidence of large posterior fossa infarct.  Left vertebral artery is dominant.  Calcification of the intracranial aspect of the vertebral arteries with mild to moderate narrowing greater on the left.  Poor delineation of the right PICA.  Slightly irregular narrowed left PICA.  Calcification cavernous segment of the internal carotid artery bilaterally with mild to slightly moderate narrowing.   Carotids (-)  2D ECHO: - Left ventricle: The cavity size was normal. There was mild concentric hypertrophy. Systolic function was severely reduced. The estimated ejection fraction was in the range of 20% to 25%. Akinesis of the mid-distalanteroseptal, anterior, and apical myocardium. Akinesis of the inferior myocardium. - Aortic valve: A bioprosthetic aortic valve is in place. There may be mild rocking and some lucency is seen around the valve ring. Moderate regurgitation is seen and may be pervalvular rather than valvular. Suggest TEE to evaluate further Moderate regurgitation. - Mitral valve: Mild to moderate regurgitation. - Left atrium: The atrium was severely dilated. - Right ventricle: The cavity size was dilated. Wall thickness was normal. - Right atrium: The atrium was mildly to moderately dilated. - Pulmonary arteries: Systolic pressure was mildly to moderately increased. PA  peak pressure: 39mm Hg (S). Based on the Lucency and recommendation for TEE will consult Cards.  TAVR - known.  Discussed with Cards.  Ischemic CM.  EF 20-25%.  Euvolemic.  AFib - On Coumadin.  Rate Controlled.  INR goal 2-3 at moment.  May nee 2.5-3.5?  Will ask cards to comment.  B/C this is Valvular AFib Xarelto et al type meds are contraindicated.  RF Reduction  DVT Prophylaxis  I wrote hand RX for OutPt Rx for Balance Training/Therpy.  Will await Cards consult and if they are going to do tests and have recommendations we need to work on keep him till tomorrow. O/W d/c later today    LOS: 2 days   Shamina Etheridge,Josean M 12/04/2011, 7:59 AM

## 2011-12-04 NOTE — Telephone Encounter (Signed)
I called patient.  Admitted with ?TIA.  Admitting INR is 2.6.  Has had TAVR and chronic atrial fib.  ? New stroke.  Doing better.  See note of Dr. Eden Emms.  I had Lawson Fiscal review old chart from [place]---he apparently had hyperkalemia with ACE in past.  In [place], ACE was also stopped or not started due to low BP.  Of note, he also has some chronic cough, and does not want to add to this.  He is home now and we will follow him up in the office.  TS

## 2011-12-04 NOTE — Plan of Care (Signed)
Problem: Discharge Progression Outcomes Goal: INR monitor plan established Outcome: Completed/Met Date Met:  12/04/11 Patient self monitors PT/INR at home and sends to company to dose coumadin

## 2011-12-04 NOTE — Consult Note (Addendum)
CARDIOLOGY CONSULT NOTE    Patient ID: [name] MRN: [id number] DOB/AGE: 1933/02/05 78 y.o.  Admit date: 12/02/2011 Referring Physician:  Timothy Lasso Primary Physician: No primary provider on file. Primary Cardiologist:  Riley Kill Reason for Consultation:  TIA recent TAVR  Principal Problem:  *Gait instability Active Problems:  Atrial fibrillation  S/P aortic valve replacement  Ischemic cardiomyopathy  Atrial fibrillation, chronic  Anticoagulated on Coumadin   HPI:  The patient is a 78 year old Caucasian man with multiple medical problems who was in his usual state of fairly good health until earlier today when he was sitting in the car and developed a sudden onset of blurred vision with nausea. His symptoms lasted about 10 minutes and then he was able to drive home without difficulty. Again, when getting out of his car he had some blurred vision with severe dizziness (vertigo), but no muscle weakness. He also developed nausea without vomiting. He denied having symptoms of headache, neck pain, chest pain or palpitations, or shortness of breath. He had persistent moderate nausea, so he called me and I recommended emergency room evaluation for possible stroke versus TIA. His symptoms have subsequently improved a great deal, although he did have some mild vertigo earlier this evening when standing to have a chest x-ray done.  MRI not done due to AICD  CT with left lenticular and right frontal lobe infarct ? Not acute.  CTA with no carotid disease or intracranial large vessel disease .  Symptoms have resolved.  Has had chronic gait instability.    Has chronic afib.  INR Rx on admission.  Recent TAVR procedure at [facility] clinic.  Residual AR from annular calcification.  Reviewed echo from 9/22 and no source of embolus  Moderate perivalvular AR residual from TAVR  EF down and ICD wire in RV   Study Conclusions  - Left ventricle: The cavity size was normal. There was mild concentric  hypertrophy. Systolic function was severely reduced. The estimated ejection fraction was in the range of 20% to 25%. Akinesis of the mid-distalanteroseptal, anterior, and apical myocardium. Akinesis of the inferior myocardium. - Aortic valve: A bioprosthetic aortic valve is in place. There may be mild rocking and some lucency is seen around the valve ring. Moderate regurgitation is seen and may be pervalvular rather than valvular. Suggest TEE to evaluate further Moderate regurgitation. - Mitral valve: Mild to moderate regurgitation. - Left atrium: The atrium was severely dilated. - Right ventricle: The cavity size was dilated. Wall thickness was normal. - Right atrium: The atrium was mildly to moderately dilated. - Pulmonary arteries: Systolic pressure was mildly to moderately increased. PA peak pressure: 39mm Hg (S).     @ROS @ All other systems reviewed and negative except as noted above  Past Medical History  Diagnosis Date  . Ischemic cardiomyopathy 05/2010    Has EF of 25%  . Diverticulitis     CURRENTLY CONTROLLED WITH NO EVIDENCE OF RECURRENT INFECTION  . AF (atrial fibrillation)     Has not tolerated amiodarone in the past. Amiodarone was stopped in September of 2010 due to side effects  . Chronic anticoagulation     on coumadin  . ICD (implantable cardiac defibrillator) in place   . Prostate cancer   . Osteoarthritis     RIGHT KNEE  . Aortic stenosis, severe     WITH PERCUTANEOUS AORTIC VALVE (TAVI) IN March 2012 at the [facility]  . MI (myocardial infarction) 1974, 55  . Cervical myelopathy   . Pulmonary embolism   .  Hyperlipidemia   . GERD (gastroesophageal reflux disease)   . Esophageal stricture     WITH DILATATION  . NSVT (nonsustained ventricular tachycardia)   . Coronary artery disease   . Stroke   . Heart murmur   . Pacemaker     Family History  Problem Relation Age of Onset  . Heart disease Mother 71  . Diabetes Mother 20  . Heart  disease Father 9    History   Social History  . Marital Status: Married    Spouse Name: N/A    Number of Children: Y  . Years of Education: N/A   Occupational History  . retired. developer.     Social History Main Topics  . Smoking status: Former Smoker -- 4.0 packs/day for 15 years    Types: Cigarettes    Quit date: 06/15/1969  . Smokeless tobacco: Never Used  . Alcohol Use: No  . Drug Use: No  . Sexually Active: Yes   Other Topics Concern  . Not on file   Social History Narrative  . No narrative on file    Past Surgical History  Procedure Date  . Icd Feb 2003  . Coronary artery bypass graft 1979  . Coronary artery bypass graft 1991    REDO SURGERY  . Cardiac catheterization 2010    SEVERE LV DYSFUNCTION WITH ESTIMATED EJECTION FRACTION OF 25%  . Cholecystectomy   . Aortic valve replacement     Percutaneous [facility] in March 2012 at [facility]  . Cardioversion 02/16/2011    Procedure: CARDIOVERSION;  Surgeon: Luis Abed, MD;  Location: [facility] OR;  Service: Cardiovascular;  Laterality: N/A;  . Shoulder surgery   . Pacemaker placement         . alfuzosin  10 mg Oral QHS  . aspirin EC  81 mg Oral Daily  . atorvastatin  10 mg Oral q1800  . carvedilol  12.5 mg Oral BID WC  . digoxin  0.0625 mg Oral Daily  . furosemide  20 mg Oral Q3 days  . levothyroxine  150 mcg Oral QAC breakfast  . multivitamin with minerals  1 tablet Oral Daily  . pantoprazole  40 mg Oral Q1200  . warfarin  5 mg Oral QPM  . Warfarin - Physician Dosing Inpatient   Does not apply q1800      . sodium chloride      Physical Exam: Blood pressure 104/69, pulse 72, temperature 98.6 F (37 C), temperature source Oral, resp. rate 20, height 6\' 3"  (1.905 m), weight 212 lb 1.3 oz (96.2 kg), SpO2 93.00%.   Affect appropriate Healthy:  appears stated age HEENT: normal Neck supple with no adenopathy JVP normal no bruits no thyromegaly Lungs clear with no wheezing and good diaphragmatic  motion Heart:  S1/S2 AR  murmur, no rub, gallop or click PMI normal Abdomen: benighn, BS positve, no tenderness, no AAA no bruit.  No HSM or HJR Distal pulses intact with no bruits No edema Neuro non-focal Skin warm and dry No muscular weakness   Labs:   Lab Results  Component Value Date   WBC 7.8 12/03/2011   HGB 12.7* 12/03/2011   HCT 37.5* 12/03/2011   MCV 91.0 12/03/2011   PLT 156 12/03/2011    Lab 12/03/11 0640  NA 136  K 4.2  CL 101  CO2 27  BUN 9  CREATININE 0.98  CALCIUM 8.8  PROT 5.8*  BILITOT 0.6  ALKPHOS 59  ALT 14  AST 18  GLUCOSE 95   Lab Results  Component Value Date   CKTOTAL 70 02/18/2010   CKMB 2.3 02/18/2010   TROPONINI <0.30 12/02/2011    Lab Results  Component Value Date   CHOL 129 12/03/2011   CHOL 119 07/11/2011   Lab Results  Component Value Date   HDL 42 12/03/2011   HDL 46.20 07/11/2011   Lab Results  Component Value Date   LDLCALC 74 12/03/2011   LDLCALC 59 07/11/2011   Lab Results  Component Value Date   TRIG 63 12/03/2011   TRIG 69.0 07/11/2011   Lab Results  Component Value Date   CHOLHDL 3.1 12/03/2011   CHOLHDL 3 07/11/2011   No results found for this basename: LDLDIRECT      Radiology: Ct Angio Head W/cm &/or Wo Cm  12/03/2011  *RADIOLOGY REPORT*  Clinical Data:  Sudden onset of blurred vision and nausea.  Vertigo and dizziness.  Defibrillator and cannot have MR.  CT ANGIOGRAPHY HEAD AND NECK  Technique:  Multidetector CT imaging of the head and neck was performed using the standard protocol during bolus administration of intravenous contrast.  Multiplanar CT image reconstructions including MIPs were obtained to evaluate the vascular anatomy. Carotid stenosis measurements (when applicable) are obtained utilizing NASCET criteria, using the distal internal carotid diameter as the denominator.  Contrast: 50mL OMNIPAQUE IOHEXOL 350 MG/ML SOLN  Comparison:  12/02/2011 head CT.  CTA NECK  Findings:  Atherosclerotic type changes with  calcification of ectatic aortic arch.  Ascending thoracic aorta measures up to 3.6 cm.  Focal plaque proximal descending thoracic aorta.  Bilateral carotid bifurcation plaque/calcification with narrowing less than 50%.  Mild narrowing proximal right subclavian artery.  Mild narrowing proximal right vertebral artery.  Mild plaquing narrowing left subclavian artery just proximal to the takeoff of the left vertebral artery.  Very mild narrowing proximal left vertebral artery.  Mild ectasia of the vertical segment of the vertebral arteries bilaterally without high-grade stenosis.  Cervical spondylotic changes with spinal stenosis and foraminal narrowing of various degrees.  This includes C3-4 mild to moderate spinal stenosis and cord flattening, C4-5 right paracentral disc protrusion with slight cord flattening and right C5-6 disc osteophyte with right-sided spinal stenosis.  No primary neck mass or neck adenopathy.  No worrisome lung apical lesion detected.   Review of the MIP images confirms the above findings.  IMPRESSION: Bilateral carotid bifurcation plaque/calcification with narrowing less than 50%.  Mild narrowing proximal right subclavian artery.  Mild narrowing proximal right vertebral artery.  Mild plaquing narrowing left subclavian artery just proximal to the takeoff of the left vertebral artery.  Very mild narrowing proximal left vertebral artery.  CTA HEAD  Findings:  No CT evidence of large acute infarct.  Specifically, no evidence of large posterior fossa infarct.  Small vessel disease type changes.  Hypodensity frontal lobes greater on the right and left lenticular nucleus may represent result of prior infarct/ischemia.  No intracranial hemorrhage detected on contrast enhanced examination.  No intracranial enhancing lesion.  Persistent complete opacification left sphenoid sinus.  Left vertebral artery is dominant.  Calcification of the intracranial aspect of the vertebral arteries with mild to moderate  narrowing greater on the left.  Poor delineation of the right PICA.  Slightly irregular narrowed left PICA.  AICAs visualized.  Slight irregularity of the basilar artery with tortuosity without high-grade stenosis.  Mild posterior cerebral artery distal branch vessel irregularity. Fetal origin of the right posterior cerebral artery.  Calcification cavernous segment of the internal  carotid artery bilaterally with mild to slightly moderate narrowing.  Otherwise anterior circulation without medium or large size vessel significant stenosis or occlusion.  No aneurysm or vascular malformation noted.   Review of the MIP images confirms the above findings.  IMPRESSION: No CT evidence of large acute infarct.  Specifically, no evidence of large posterior fossa infarct.  Left vertebral artery is dominant.  Calcification of the intracranial aspect of the vertebral arteries with mild to moderate narrowing greater on the left.  Poor delineation of the right PICA.  Slightly irregular narrowed left PICA.  Calcification cavernous segment of the internal carotid artery bilaterally with mild to slightly moderate narrowing.   Original Report Authenticated By: Fuller Canada, M.D.    Dg Chest 2 View  12/02/2011  *RADIOLOGY REPORT*  Clinical Data: Near syncope.  CHEST - 2 VIEW  Comparison: 11/28/2010  Findings: AICD in good position.  Aortic valve replacement unchanged.  Negative for heart failure.  Negative for infiltrate or effusion.  Lungs remain clear.  IMPRESSION: No acute cardiopulmonary disease.   Original Report Authenticated By: Camelia Phenes, M.D.    Ct Head Wo Contrast  12/02/2011  *RADIOLOGY REPORT*  Clinical Data: Near syncope.  Dizziness with nausea.  Unable to have MR secondary to defibrillator.  CT HEAD WITHOUT CONTRAST  Technique:  Contiguous axial images were obtained from the base of the skull through the vertex without contrast.  Comparison: None.  Findings: No intracranial hemorrhage.  Left lenticular nucleus  infarct and right frontal lobe infarct of indeterminate age.  No CT evidence of large acute infarct.  Global atrophy without hydrocephalus  Small vessel disease type changes.  No intracranial mass lesion detected on this unenhanced exam.  Vascular calcifications.  Complete opacification left sphenoid sinus.  IMPRESSION:  No intracranial hemorrhage.  Left lenticular nucleus infarct and right frontal lobe infarct of indeterminate age.  No CT evidence of large acute infarct.  Global atrophy without hydrocephalus  Small vessel disease type changes.  Vascular calcifications.  Complete opacification left sphenoid sinus.   Original Report Authenticated By: Fuller Canada, M.D.    Ct Angio Neck W/cm &/or Wo/cm  12/03/2011  *RADIOLOGY REPORT*  Clinical Data:  Sudden onset of blurred vision and nausea.  Vertigo and dizziness.  Defibrillator and cannot have MR.  CT ANGIOGRAPHY HEAD AND NECK  Technique:  Multidetector CT imaging of the head and neck was performed using the standard protocol during bolus administration of intravenous contrast.  Multiplanar CT image reconstructions including MIPs were obtained to evaluate the vascular anatomy. Carotid stenosis measurements (when applicable) are obtained utilizing NASCET criteria, using the distal internal carotid diameter as the denominator.  Contrast: 50mL OMNIPAQUE IOHEXOL 350 MG/ML SOLN  Comparison:  12/02/2011 head CT.  CTA NECK  Findings:  Atherosclerotic type changes with calcification of ectatic aortic arch.  Ascending thoracic aorta measures up to 3.6 cm.  Focal plaque proximal descending thoracic aorta.  Bilateral carotid bifurcation plaque/calcification with narrowing less than 50%.  Mild narrowing proximal right subclavian artery.  Mild narrowing proximal right vertebral artery.  Mild plaquing narrowing left subclavian artery just proximal to the takeoff of the left vertebral artery.  Very mild narrowing proximal left vertebral artery.  Mild ectasia of the vertical  segment of the vertebral arteries bilaterally without high-grade stenosis.  Cervical spondylotic changes with spinal stenosis and foraminal narrowing of various degrees.  This includes C3-4 mild to moderate spinal stenosis and cord flattening, C4-5 right paracentral disc protrusion with slight cord  flattening and right C5-6 disc osteophyte with right-sided spinal stenosis.  No primary neck mass or neck adenopathy.  No worrisome lung apical lesion detected.   Review of the MIP images confirms the above findings.  IMPRESSION: Bilateral carotid bifurcation plaque/calcification with narrowing less than 50%.  Mild narrowing proximal right subclavian artery.  Mild narrowing proximal right vertebral artery.  Mild plaquing narrowing left subclavian artery just proximal to the takeoff of the left vertebral artery.  Very mild narrowing proximal left vertebral artery.  CTA HEAD  Findings:  No CT evidence of large acute infarct.  Specifically, no evidence of large posterior fossa infarct.  Small vessel disease type changes.  Hypodensity frontal lobes greater on the right and left lenticular nucleus may represent result of prior infarct/ischemia.  No intracranial hemorrhage detected on contrast enhanced examination.  No intracranial enhancing lesion.  Persistent complete opacification left sphenoid sinus.  Left vertebral artery is dominant.  Calcification of the intracranial aspect of the vertebral arteries with mild to moderate narrowing greater on the left.  Poor delineation of the right PICA.  Slightly irregular narrowed left PICA.  AICAs visualized.  Slight irregularity of the basilar artery with tortuosity without high-grade stenosis.  Mild posterior cerebral artery distal branch vessel irregularity. Fetal origin of the right posterior cerebral artery.  Calcification cavernous segment of the internal carotid artery bilaterally with mild to slightly moderate narrowing.  Otherwise anterior circulation without medium or large  size vessel significant stenosis or occlusion.  No aneurysm or vascular malformation noted.   Review of the MIP images confirms the above findings.  IMPRESSION: No CT evidence of large acute infarct.  Specifically, no evidence of large posterior fossa infarct.  Left vertebral artery is dominant.  Calcification of the intracranial aspect of the vertebral arteries with mild to moderate narrowing greater on the left.  Poor delineation of the right PICA.  Slightly irregular narrowed left PICA.  Calcification cavernous segment of the internal carotid artery bilaterally with mild to slightly moderate narrowing.   Original Report Authenticated By: Fuller Canada, M.D.     EKG: Afib no acute ischemic changes   ASSESSMENT AND PLAN:  TIA:  ? Biggest risk factor is not TAVR  But afib .  Defects on CT may have been procedural and dont correlate with vertibrobasilar symptoms  INR Rx and symptoms resolved.  Don't think TEE is needed   Continue INR 2.5-3.0  Any further TIA like symptoms can consider Eliquis TAVR:  FU TS  Moderate perivalvular leak should be well tolerated.  Only needs short term anticoagulation for this as the leaflets are non mechanical CHF:  DCM euvolemic continue current meds.   AICD:  Normal function  Discussed case with Dr Timothy Lasso.  SignedCharlton Haws 12/04/2011, 9:40 AM

## 2011-12-04 NOTE — Discharge Summary (Signed)
Physician Discharge Summary  DISCHARGE SUMMARY   Patient ID: [name] MR#: [id number] DOB/AGE: 1932/11/25 78 y.o.   Attending Physician:Nathalia Wismer,Damian M  Patient's PCP:No primary provider on file.  Consults:Treatment Team:  Md Stroke, MD Wendall Stade, MD**  Admit date: 12/02/2011 Discharge date: 12/04/2011  Discharge Diagnoses:  Principal Problem:  *Gait instability Active Problems:  Atrial fibrillation  S/P aortic valve replacement  Ischemic cardiomyopathy  Atrial fibrillation, chronic  Anticoagulated on Coumadin   Patient Active Problem List  Diagnosis  . VENTRICULAR TACHYCARDIA  . Atrial fibrillation  . OTHER SYMPTOMS INVOLVING CARDIOVASCULAR SYSTEM  . S/P aortic valve replacement  . Indigestion  . Ischemic cardiomyopathy  . Diverticulitis  . Chronic anticoagulation  . ICD (implantable cardiac defibrillator) in place  . Prostate cancer  . Osteoarthritis  . Aortic stenosis, severe  . Cervical myelopathy  . Pulmonary embolism  . Hyperlipidemia  . GERD (gastroesophageal reflux disease)  . Esophageal stricture  . Encounter for long-term (current) use of anticoagulants  . S/P aortic valve replacement  . Chronic cough  . Deformity, finger acquired  . Gait instability  . Atrial fibrillation, chronic  . Anticoagulated on Coumadin   Past Medical History  Diagnosis Date  . Ischemic cardiomyopathy 05/2010    Has EF of 25%  . Diverticulitis     CURRENTLY CONTROLLED WITH NO EVIDENCE OF RECURRENT INFECTION  . AF (atrial fibrillation)     Has not tolerated amiodarone in the past. Amiodarone was stopped in September of 2010 due to side effects  . Chronic anticoagulation     on coumadin  . ICD (implantable cardiac defibrillator) in place   . Prostate cancer   . Osteoarthritis     RIGHT KNEE  . Aortic stenosis, severe     WITH PERCUTANEOUS AORTIC VALVE (TAVI) IN March 2012 at the [facility]  . MI (myocardial infarction) 1974, 53  . Cervical  myelopathy   . Pulmonary embolism   . Hyperlipidemia   . GERD (gastroesophageal reflux disease)   . Esophageal stricture     WITH DILATATION  . NSVT (nonsustained ventricular tachycardia)   . Coronary artery disease   . Stroke   . Heart murmur   . Pacemaker     Discharged Condition: good   Discharge Medications:   Medication List     As of 12/04/2011  9:51 AM    TAKE these medications         alfuzosin 10 MG 24 hr tablet   Commonly known as: UROXATRAL   Take 10 mg by mouth at bedtime.      aspirin 81 MG chewable tablet   Chew 81 mg by mouth daily.      carvedilol 12.5 MG tablet   Commonly known as: COREG   Take 12.5 mg by mouth 2 (two) times daily with a meal.      digoxin 0.125 MG tablet   Commonly known as: LANOXIN   Take 0.0625 mg by mouth daily.      furosemide 20 MG tablet   Commonly known as: LASIX   Take 20 mg by mouth every 3 (three) days.      HYDROcodone-acetaminophen 5-325 MG per tablet   Commonly known as: NORCO/VICODIN   Take 1-2 tablets by mouth every 4 (four) hours as needed for pain.      levothyroxine 150 MCG tablet   Commonly known as: SYNTHROID, LEVOTHROID   Take 150 mcg by mouth daily.      multivitamin tablet  Take 1 tablet by mouth daily.      nitroGLYCERIN 0.4 MG SL tablet   Commonly known as: NITROSTAT   Place 0.4 mg under the tongue every 5 (five) minutes as needed. For chest pain      omeprazole 20 MG capsule   Commonly known as: PRILOSEC   Take 20 mg by mouth 2 (two) times daily.      polyethylene glycol powder powder   Commonly known as: GLYCOLAX/MIRALAX   Take 17 g by mouth daily as needed. For constipation      rosuvastatin 5 MG tablet   Commonly known as: CRESTOR   Take 5 mg by mouth every other day.      sildenafil 50 MG tablet   Commonly known as: VIAGRA   Take 1 tablet (50 mg total) by mouth daily as needed for erectile dysfunction.      warfarin 5 MG tablet   Commonly known as: COUMADIN   Take 5 mg by mouth  every evening.         Hospital Procedures: Ct Angio Head W/cm &/or Wo Cm  12/03/2011  *RADIOLOGY REPORT*  Clinical Data:  Sudden onset of blurred vision and nausea.  Vertigo and dizziness.  Defibrillator and cannot have MR.  CT ANGIOGRAPHY HEAD AND NECK  Technique:  Multidetector CT imaging of the head and neck was performed using the standard protocol during bolus administration of intravenous contrast.  Multiplanar CT image reconstructions including MIPs were obtained to evaluate the vascular anatomy. Carotid stenosis measurements (when applicable) are obtained utilizing NASCET criteria, using the distal internal carotid diameter as the denominator.  Contrast: 50mL OMNIPAQUE IOHEXOL 350 MG/ML SOLN  Comparison:  12/02/2011 head CT.  CTA NECK  Findings:  Atherosclerotic type changes with calcification of ectatic aortic arch.  Ascending thoracic aorta measures up to 3.6 cm.  Focal plaque proximal descending thoracic aorta.  Bilateral carotid bifurcation plaque/calcification with narrowing less than 50%.  Mild narrowing proximal right subclavian artery.  Mild narrowing proximal right vertebral artery.  Mild plaquing narrowing left subclavian artery just proximal to the takeoff of the left vertebral artery.  Very mild narrowing proximal left vertebral artery.  Mild ectasia of the vertical segment of the vertebral arteries bilaterally without high-grade stenosis.  Cervical spondylotic changes with spinal stenosis and foraminal narrowing of various degrees.  This includes C3-4 mild to moderate spinal stenosis and cord flattening, C4-5 right paracentral disc protrusion with slight cord flattening and right C5-6 disc osteophyte with right-sided spinal stenosis.  No primary neck mass or neck adenopathy.  No worrisome lung apical lesion detected.   Review of the MIP images confirms the above findings.  IMPRESSION: Bilateral carotid bifurcation plaque/calcification with narrowing less than 50%.  Mild narrowing  proximal right subclavian artery.  Mild narrowing proximal right vertebral artery.  Mild plaquing narrowing left subclavian artery just proximal to the takeoff of the left vertebral artery.  Very mild narrowing proximal left vertebral artery.  CTA HEAD  Findings:  No CT evidence of large acute infarct.  Specifically, no evidence of large posterior fossa infarct.  Small vessel disease type changes.  Hypodensity frontal lobes greater on the right and left lenticular nucleus may represent result of prior infarct/ischemia.  No intracranial hemorrhage detected on contrast enhanced examination.  No intracranial enhancing lesion.  Persistent complete opacification left sphenoid sinus.  Left vertebral artery is dominant.  Calcification of the intracranial aspect of the vertebral arteries with mild to moderate narrowing greater on the left.  Poor delineation of the right PICA.  Slightly irregular narrowed left PICA.  AICAs visualized.  Slight irregularity of the basilar artery with tortuosity without high-grade stenosis.  Mild posterior cerebral artery distal branch vessel irregularity. Fetal origin of the right posterior cerebral artery.  Calcification cavernous segment of the internal carotid artery bilaterally with mild to slightly moderate narrowing.  Otherwise anterior circulation without medium or large size vessel significant stenosis or occlusion.  No aneurysm or vascular malformation noted.   Review of the MIP images confirms the above findings.  IMPRESSION: No CT evidence of large acute infarct.  Specifically, no evidence of large posterior fossa infarct.  Left vertebral artery is dominant.  Calcification of the intracranial aspect of the vertebral arteries with mild to moderate narrowing greater on the left.  Poor delineation of the right PICA.  Slightly irregular narrowed left PICA.  Calcification cavernous segment of the internal carotid artery bilaterally with mild to slightly moderate narrowing.   Original  Report Authenticated By: Fuller Canada, M.D.    Dg Chest 2 View  12/02/2011  *RADIOLOGY REPORT*  Clinical Data: Near syncope.  CHEST - 2 VIEW  Comparison: 11/28/2010  Findings: AICD in good position.  Aortic valve replacement unchanged.  Negative for heart failure.  Negative for infiltrate or effusion.  Lungs remain clear.  IMPRESSION: No acute cardiopulmonary disease.   Original Report Authenticated By: Camelia Phenes, M.D.    Ct Head Wo Contrast  12/02/2011  *RADIOLOGY REPORT*  Clinical Data: Near syncope.  Dizziness with nausea.  Unable to have MR secondary to defibrillator.  CT HEAD WITHOUT CONTRAST  Technique:  Contiguous axial images were obtained from the base of the skull through the vertex without contrast.  Comparison: None.  Findings: No intracranial hemorrhage.  Left lenticular nucleus infarct and right frontal lobe infarct of indeterminate age.  No CT evidence of large acute infarct.  Global atrophy without hydrocephalus  Small vessel disease type changes.  No intracranial mass lesion detected on this unenhanced exam.  Vascular calcifications.  Complete opacification left sphenoid sinus.  IMPRESSION:  No intracranial hemorrhage.  Left lenticular nucleus infarct and right frontal lobe infarct of indeterminate age.  No CT evidence of large acute infarct.  Global atrophy without hydrocephalus  Small vessel disease type changes.  Vascular calcifications.  Complete opacification left sphenoid sinus.   Original Report Authenticated By: Fuller Canada, M.D.    Ct Angio Neck W/cm &/or Wo/cm  12/03/2011  *RADIOLOGY REPORT*  Clinical Data:  Sudden onset of blurred vision and nausea.  Vertigo and dizziness.  Defibrillator and cannot have MR.  CT ANGIOGRAPHY HEAD AND NECK  Technique:  Multidetector CT imaging of the head and neck was performed using the standard protocol during bolus administration of intravenous contrast.  Multiplanar CT image reconstructions including MIPs were obtained to evaluate  the vascular anatomy. Carotid stenosis measurements (when applicable) are obtained utilizing NASCET criteria, using the distal internal carotid diameter as the denominator.  Contrast: 50mL OMNIPAQUE IOHEXOL 350 MG/ML SOLN  Comparison:  12/02/2011 head CT.  CTA NECK  Findings:  Atherosclerotic type changes with calcification of ectatic aortic arch.  Ascending thoracic aorta measures up to 3.6 cm.  Focal plaque proximal descending thoracic aorta.  Bilateral carotid bifurcation plaque/calcification with narrowing less than 50%.  Mild narrowing proximal right subclavian artery.  Mild narrowing proximal right vertebral artery.  Mild plaquing narrowing left subclavian artery just proximal to the takeoff of the left vertebral artery.  Very mild narrowing proximal  left vertebral artery.  Mild ectasia of the vertical segment of the vertebral arteries bilaterally without high-grade stenosis.  Cervical spondylotic changes with spinal stenosis and foraminal narrowing of various degrees.  This includes C3-4 mild to moderate spinal stenosis and cord flattening, C4-5 right paracentral disc protrusion with slight cord flattening and right C5-6 disc osteophyte with right-sided spinal stenosis.  No primary neck mass or neck adenopathy.  No worrisome lung apical lesion detected.   Review of the MIP images confirms the above findings.  IMPRESSION: Bilateral carotid bifurcation plaque/calcification with narrowing less than 50%.  Mild narrowing proximal right subclavian artery.  Mild narrowing proximal right vertebral artery.  Mild plaquing narrowing left subclavian artery just proximal to the takeoff of the left vertebral artery.  Very mild narrowing proximal left vertebral artery.  CTA HEAD  Findings:  No CT evidence of large acute infarct.  Specifically, no evidence of large posterior fossa infarct.  Small vessel disease type changes.  Hypodensity frontal lobes greater on the right and left lenticular nucleus may represent result of  prior infarct/ischemia.  No intracranial hemorrhage detected on contrast enhanced examination.  No intracranial enhancing lesion.  Persistent complete opacification left sphenoid sinus.  Left vertebral artery is dominant.  Calcification of the intracranial aspect of the vertebral arteries with mild to moderate narrowing greater on the left.  Poor delineation of the right PICA.  Slightly irregular narrowed left PICA.  AICAs visualized.  Slight irregularity of the basilar artery with tortuosity without high-grade stenosis.  Mild posterior cerebral artery distal branch vessel irregularity. Fetal origin of the right posterior cerebral artery.  Calcification cavernous segment of the internal carotid artery bilaterally with mild to slightly moderate narrowing.  Otherwise anterior circulation without medium or large size vessel significant stenosis or occlusion.  No aneurysm or vascular malformation noted.   Review of the MIP images confirms the above findings.  IMPRESSION: No CT evidence of large acute infarct.  Specifically, no evidence of large posterior fossa infarct.  Left vertebral artery is dominant.  Calcification of the intracranial aspect of the vertebral arteries with mild to moderate narrowing greater on the left.  Poor delineation of the right PICA.  Slightly irregular narrowed left PICA.  Calcification cavernous segment of the internal carotid artery bilaterally with mild to slightly moderate narrowing.   Original Report Authenticated By: Fuller Canada, M.D.     History of Present Illness: Admitted with Dizzy/Nausea/Vertigo/Visual and gait ataxia for possible TIA/CVA   Hospital Course: Admitted 12/02/11 with Sxs c/w Likely brainstem TIA vrs small infarct.  In ED CCt (-). He was labbed up and Seen by Neuro.  He cannot do MRI due to his defibrillator.  His Sxs were while he was on ASA and warfarin with a therapeutic INR. Possible etiology AFib vrs small Vessel Dz. Work Up Included:  CCT/CTA: No CT  evidence of large acute infarct. Specifically, no evidence of large posterior fossa infarct. Left vertebral artery is dominant. Calcification of the intracranial aspect of the vertebral arteries with mild to moderate narrowing greater on the left. Poor delineation of the right PICA. Slightly irregular narrowed left PICA. Calcification cavernous segment of the internal carotid artery bilaterally with mild to slightly moderate narrowing.  Carotids (-)  2D ECHO: - Left ventricle: The cavity size was normal. There was mild concentric hypertrophy. Systolic function was severely reduced. The estimated ejection fraction was in the range of 20% to 25%. Akinesis of the mid-distalanteroseptal, anterior, and apical myocardium. Akinesis of the inferior myocardium. -  Aortic valve: A bioprosthetic aortic valve is in place. There may be mild rocking and some lucency is seen around the valve ring. Moderate regurgitation is seen and may be pervalvular rather than valvular. Suggest TEE to evaluate further Moderate regurgitation. - Mitral valve: Mild to moderate regurgitation. - Left atrium: The atrium was severely dilated. - Right ventricle: The cavity size was dilated. Wall thickness was normal. - Right atrium: The atrium was mildly to moderately dilated. - Pulmonary arteries: Systolic pressure was mildly to moderately increased. PA peak pressure: 39mm Hg (S).   Cards was consulted and he was seen by Dr Eden Emms and per his Note: TIA: ? Biggest risk factor is not TAVR But afib . Defects on CT may have been procedural and dont correlate with vertibrobasilar symptoms INR Rx and symptoms resolved. Don't think TEE is needed Continue INR 2.5-3.0 Any further TIA like symptoms can consider Eliquis  TAVR: FU TS Moderate perivalvular leak should be well tolerated. Only needs short term anticoagulation for this as the leaflets are non mechanical  CHF: DCM euvolemic continue current meds.  AICD: Normal function Dr Riley Kill called  him after his D/C and his note is on the computer.  TAVR - stable  Ischemic CM. EF 20-25%. Euvolemic.  AFib - On Coumadin. Rate Controlled. INR goal 2-3 on admission and cards will adjust to 2.5-3.0.  He will follow up [place] coumadin as directed. Continue RF Reduction as HR controlled, he is on correct meds, Dr Riley Kill addressed ACEi question, BP is perfect, A1C is 5.9 DVT Prophylaxis was provided. He was seen by PT and they recommended and I wrote hand RX for OutPt Rx for Balance Training/Therpy. He will call the Neuro Rehab with his order.  After cards saw the pt today and no further interventions warranted he was D/Ced home in stable condition and all Sxs had resolved..  Day of Discharge Exam BP 104/69  Pulse 72  Temp 98.6 F (37 C) (Oral)  Resp 20  Ht 6\' 3"  (1.905 m)  Wt 96.2 kg (212 lb 1.3 oz)  BMI 26.51 kg/m2  SpO2 93%  Physical Exam: See todays note.   Discharge Labs:  [facility] 12/03/11 0640 12/02/11 1902  NA 136 134*  K 4.2 4.3  CL 101 98  CO2 27 28  GLUCOSE 95 122*  BUN 9 12  CREATININE 0.98 1.01  CALCIUM 8.8 9.1  MG -- --  PHOS -- --    Basename 12/03/11 0640 12/02/11 1902  AST 18 21  ALT 14 17  ALKPHOS 59 66  BILITOT 0.6 0.5  PROT 5.8* 6.7  ALBUMIN 3.4* 3.8    Basename 12/03/11 0640 12/02/11 1902  WBC 7.8 7.7  NEUTROABS -- 5.9  HGB 12.7* 13.7  HCT 37.5* 40.5  MCV 91.0 91.0  PLT 156 176    Basename 12/02/11 1902  CKTOTAL --  CKMB --  CKMBINDEX --  TROPONINI <0.30   No results found for this basename: TSH,T4TOTAL,FREET3,T3FREE,THYROIDAB in the last 72 hours No results found for this basename: VITAMINB12:2,FOLATE:2,FERRITIN:2,TIBC:2,IRON:2,RETICCTPCT:2 in the last 72 hours Lab Results  Component Value Date   INR 2.29* 12/04/2011   INR 2.68* 12/03/2011   INR 2.63* 12/02/2011       Discharge instructions: Discharge Orders    Future Appointments: Provider: Department: Dept Phone: Center:   12/05/2011 8:15 AM [facility] [phone] None   12/15/2011 8:30 AM [facility] [employer] [facility] [phone] [facility]   01/30/2012 11:15 AM Duke Salvia, MD  [facility] [phone] [facility]    **  01-Home or Self Care Follow-up Information    Follow up with Gwen Pounds, MD. In 2 weeks.   Contact information:   [address] [facility] [place] [address] [phone]       Follow up with Shawnie Pons, MD. (Get INR checked next week and follow up With Dr Riley Kill as directed. as needed)    Contact information:   [address] [place] [address] [phone]       Follow up with Gates Rigg, MD. (Follow up with Neurology as may have been discussed with them over the weekend as needed)    Contact information:   [address] [facility] [place] [address] [phone]           Disposition: home Follow-up Appts: Follow-up with Dr. Timothy Lasso at [facility] in 2 weeks.  Call for appointment.  Condition on Discharge: stable  Tests Needing Follow-up: INR with cards  Time spent in discharge (includes decision making & examination of pt): 30 min  Signed: Zain Lankford,Teddie M 12/04/2011, 9:51 AM

## 2011-12-04 NOTE — Progress Notes (Signed)
Stroke Team Progress Note  SUBJECTIVE Overall he feels his condition is much improved. He is hoping for discharge today.   OBJECTIVE Most recent Vital Signs: Filed Vitals:   12/04/11 0503 12/04/11 0506 12/04/11 0817 12/04/11 1058  BP: 106/57 102/57 104/69 104/60  Pulse: 82 91 72 73  Temp:   98.6 F (37 C)   TempSrc:   Oral   Resp:   20   Height:      Weight:      SpO2:   93%    CBG (last 3)   Basename 12/04/11 0710 12/03/11 2101 12/03/11 1658  GLUCAP 103* 71 112*   CLINICALLY SIGNIFICANT STUDIES Basic Metabolic Panel:  Lab 12/03/11 4098 12/02/11 1902  NA 136 134*  K 4.2 4.3  CL 101 98  CO2 27 28  GLUCOSE 95 122*  BUN 9 12  CREATININE 0.98 1.01  CALCIUM 8.8 9.1  MG -- --  PHOS -- --   Liver Function Tests:  Lab 12/03/11 0640 12/02/11 1902  AST 18 21  ALT 14 17  ALKPHOS 59 66  BILITOT 0.6 0.5  PROT 5.8* 6.7  ALBUMIN 3.4* 3.8   CBC:  Lab 12/03/11 0640 12/02/11 1902  WBC 7.8 7.7  NEUTROABS -- 5.9  HGB 12.7* 13.7  HCT 37.5* 40.5  MCV 91.0 91.0  PLT 156 176   Coagulation:  Lab 12/04/11 0540 12/03/11 0640 12/02/11 1902  LABPROT 24.2* 27.2* 26.8*  INR 2.29* 2.68* 2.63*   Cardiac Enzymes:  Lab 12/02/11 1902  CKTOTAL --  CKMB --  CKMBINDEX --  TROPONINI <0.30   Lipid Panel    Component Value Date/Time   CHOL 129 12/03/2011 0640   TRIG 63 12/03/2011 0640   HDL 42 12/03/2011 0640   CHOLHDL 3.1 12/03/2011 0640   VLDL 13 12/03/2011 0640   LDLCALC 74 12/03/2011 0640   HgbA1C  Lab Results  Component Value Date   HGBA1C 5.9* 12/03/2011    Urine Drug Screen:     Component Value Date/Time   LABOPIA NONE DETECTED 12/03/2011 0120   COCAINSCRNUR NONE DETECTED 12/03/2011 0120   LABBENZ NONE DETECTED 12/03/2011 0120   AMPHETMU NONE DETECTED 12/03/2011 0120   THCU NONE DETECTED 12/03/2011 0120   LABBARB NONE DETECTED 12/03/2011 0120    Alcohol Level: No results found for this basename: ETH:2 in the last 168 hours  Ct Angio Head W/cm &/or Wo Cm  12/03/2011   *RADIOLOGY REPORT*  Clinical Data:  Sudden onset of blurred vision and nausea.  Vertigo and dizziness.  Defibrillator and cannot have MR.  CT ANGIOGRAPHY HEAD AND NECK  Technique:  Multidetector CT imaging of the head and neck was performed using the standard protocol during bolus administration of intravenous contrast.  Multiplanar CT image reconstructions including MIPs were obtained to evaluate the vascular anatomy. Carotid stenosis measurements (when applicable) are obtained utilizing NASCET criteria, using the distal internal carotid diameter as the denominator.  Contrast: 50mL OMNIPAQUE IOHEXOL 350 MG/ML SOLN  Comparison:  12/02/2011 head CT.  CTA NECK  Findings:  Atherosclerotic type changes with calcification of ectatic aortic arch.  Ascending thoracic aorta measures up to 3.6 cm.  Focal plaque proximal descending thoracic aorta.  Bilateral carotid bifurcation plaque/calcification with narrowing less than 50%.  Mild narrowing proximal right subclavian artery.  Mild narrowing proximal right vertebral artery.  Mild plaquing narrowing left subclavian artery just proximal to the takeoff of the left vertebral artery.  Very mild narrowing proximal left vertebral artery.  Mild ectasia of  the vertical segment of the vertebral arteries bilaterally without high-grade stenosis.  Cervical spondylotic changes with spinal stenosis and foraminal narrowing of various degrees.  This includes C3-4 mild to moderate spinal stenosis and cord flattening, C4-5 right paracentral disc protrusion with slight cord flattening and right C5-6 disc osteophyte with right-sided spinal stenosis.  No primary neck mass or neck adenopathy.  No worrisome lung apical lesion detected.   Review of the MIP images confirms the above findings.  IMPRESSION: Bilateral carotid bifurcation plaque/calcification with narrowing less than 50%.  Mild narrowing proximal right subclavian artery.  Mild narrowing proximal right vertebral artery.  Mild plaquing  narrowing left subclavian artery just proximal to the takeoff of the left vertebral artery.  Very mild narrowing proximal left vertebral artery.  CTA HEAD  Findings:  No CT evidence of large acute infarct.  Specifically, no evidence of large posterior fossa infarct.  Small vessel disease type changes.  Hypodensity frontal lobes greater on the right and left lenticular nucleus may represent result of prior infarct/ischemia.  No intracranial hemorrhage detected on contrast enhanced examination.  No intracranial enhancing lesion.  Persistent complete opacification left sphenoid sinus.  Left vertebral artery is dominant.  Calcification of the intracranial aspect of the vertebral arteries with mild to moderate narrowing greater on the left.  Poor delineation of the right PICA.  Slightly irregular narrowed left PICA.  AICAs visualized.  Slight irregularity of the basilar artery with tortuosity without high-grade stenosis.  Mild posterior cerebral artery distal branch vessel irregularity. Fetal origin of the right posterior cerebral artery.  Calcification cavernous segment of the internal carotid artery bilaterally with mild to slightly moderate narrowing.  Otherwise anterior circulation without medium or large size vessel significant stenosis or occlusion.  No aneurysm or vascular malformation noted.   Review of the MIP images confirms the above findings.  IMPRESSION: No CT evidence of large acute infarct.  Specifically, no evidence of large posterior fossa infarct.  Left vertebral artery is dominant.  Calcification of the intracranial aspect of the vertebral arteries with mild to moderate narrowing greater on the left.  Poor delineation of the right PICA.  Slightly irregular narrowed left PICA.  Calcification cavernous segment of the internal carotid artery bilaterally with mild to slightly moderate narrowing.   Original Report Authenticated By: Fuller Canada, M.D.    Dg Chest 2 View  12/02/2011  *RADIOLOGY REPORT*   Clinical Data: Near syncope.  CHEST - 2 VIEW  Comparison: 11/28/2010  Findings: AICD in good position.  Aortic valve replacement unchanged.  Negative for heart failure.  Negative for infiltrate or effusion.  Lungs remain clear.  IMPRESSION: No acute cardiopulmonary disease.   Original Report Authenticated By: Camelia Phenes, M.D.    Ct Head Wo Contrast  12/02/2011  *RADIOLOGY REPORT*  Clinical Data: Near syncope.  Dizziness with nausea.  Unable to have MR secondary to defibrillator.  CT HEAD WITHOUT CONTRAST  Technique:  Contiguous axial images were obtained from the base of the skull through the vertex without contrast.  Comparison: None.  Findings: No intracranial hemorrhage.  Left lenticular nucleus infarct and right frontal lobe infarct of indeterminate age.  No CT evidence of large acute infarct.  Global atrophy without hydrocephalus  Small vessel disease type changes.  No intracranial mass lesion detected on this unenhanced exam.  Vascular calcifications.  Complete opacification left sphenoid sinus.  IMPRESSION:  No intracranial hemorrhage.  Left lenticular nucleus infarct and right frontal lobe infarct of indeterminate age.  No CT  evidence of large acute infarct.  Global atrophy without hydrocephalus  Small vessel disease type changes.  Vascular calcifications.  Complete opacification left sphenoid sinus.   Original Report Authenticated By: Fuller Canada, M.D.    Ct Angio Neck W/cm &/or Wo/cm  12/03/2011  *RADIOLOGY REPORT*  Clinical Data:  Sudden onset of blurred vision and nausea.  Vertigo and dizziness.  Defibrillator and cannot have MR.  CT ANGIOGRAPHY HEAD AND NECK  Technique:  Multidetector CT imaging of the head and neck was performed using the standard protocol during bolus administration of intravenous contrast.  Multiplanar CT image reconstructions including MIPs were obtained to evaluate the vascular anatomy. Carotid stenosis measurements (when applicable) are obtained utilizing NASCET  criteria, using the distal internal carotid diameter as the denominator.  Contrast: 50mL OMNIPAQUE IOHEXOL 350 MG/ML SOLN  Comparison:  12/02/2011 head CT.  CTA NECK  Findings:  Atherosclerotic type changes with calcification of ectatic aortic arch.  Ascending thoracic aorta measures up to 3.6 cm.  Focal plaque proximal descending thoracic aorta.  Bilateral carotid bifurcation plaque/calcification with narrowing less than 50%.  Mild narrowing proximal right subclavian artery.  Mild narrowing proximal right vertebral artery.  Mild plaquing narrowing left subclavian artery just proximal to the takeoff of the left vertebral artery.  Very mild narrowing proximal left vertebral artery.  Mild ectasia of the vertical segment of the vertebral arteries bilaterally without high-grade stenosis.  Cervical spondylotic changes with spinal stenosis and foraminal narrowing of various degrees.  This includes C3-4 mild to moderate spinal stenosis and cord flattening, C4-5 right paracentral disc protrusion with slight cord flattening and right C5-6 disc osteophyte with right-sided spinal stenosis.  No primary neck mass or neck adenopathy.  No worrisome lung apical lesion detected.   Review of the MIP images confirms the above findings.  IMPRESSION: Bilateral carotid bifurcation plaque/calcification with narrowing less than 50%.  Mild narrowing proximal right subclavian artery.  Mild narrowing proximal right vertebral artery.  Mild plaquing narrowing left subclavian artery just proximal to the takeoff of the left vertebral artery.  Very mild narrowing proximal left vertebral artery.  CTA HEAD  Findings:  No CT evidence of large acute infarct.  Specifically, no evidence of large posterior fossa infarct.  Small vessel disease type changes.  Hypodensity frontal lobes greater on the right and left lenticular nucleus may represent result of prior infarct/ischemia.  No intracranial hemorrhage detected on contrast enhanced examination.  No  intracranial enhancing lesion.  Persistent complete opacification left sphenoid sinus.  Left vertebral artery is dominant.  Calcification of the intracranial aspect of the vertebral arteries with mild to moderate narrowing greater on the left.  Poor delineation of the right PICA.  Slightly irregular narrowed left PICA.  AICAs visualized.  Slight irregularity of the basilar artery with tortuosity without high-grade stenosis.  Mild posterior cerebral artery distal branch vessel irregularity. Fetal origin of the right posterior cerebral artery.  Calcification cavernous segment of the internal carotid artery bilaterally with mild to slightly moderate narrowing.  Otherwise anterior circulation without medium or large size vessel significant stenosis or occlusion.  No aneurysm or vascular malformation noted.   Review of the MIP images confirms the above findings.  IMPRESSION: No CT evidence of large acute infarct.  Specifically, no evidence of large posterior fossa infarct.  Left vertebral artery is dominant.  Calcification of the intracranial aspect of the vertebral arteries with mild to moderate narrowing greater on the left.  Poor delineation of the right PICA.  Slightly irregular  narrowed left PICA.  Calcification cavernous segment of the internal carotid artery bilaterally with mild to slightly moderate narrowing.   Original Report Authenticated By: Fuller Canada, M.D.    Therapy Recommendations PT - OP; OT - none  Physical Exam Pleasant elderly caucasian male in no distress.Awake alert. Afebrile. Head is nontraumatic. Neck is supple without bruit. Hearing is normal. Cardiac exam no murmur or gallop. Lungs are clear to auscultation. Distal pulses are well felt.  Neurological Exam : Awake Alert oriented x 3. Normal speech and language.eye movements full without nystagmus.Fundi not visualized. Vision acuity is normal. Face symmetric. Tongue midline. Normal strength, tone, reflexes and coordination. Normal  sensation. Gait broad based slightly ataxic  ASSESSMENT [name] is a 78 y.o. male presenting with blurred vision, nausea, vertigo and gait ataxia. Likely brainstem TIA/small infarct not seen on CT. Cannot do MRI due to defibrillator. Brainstem lesion likely embolic etiology secondary to atrial fibrillation. On warfarin with optimal INR 2.6 and aspirin on admission. Also has artificial heart valve.  Hospital day # 2  TREATMENT/PLAN  Continue aspirin 81 mg orally every day and warfarin for secondary stroke prevention given cardiac history.   Ok for discharge from stroke standpoint Stroke Service will sign off. Follow up with Dr. Pearlean Brownie, Stroke Clinic  Annie Main, MSN, RN, ANVP-BC, ANP-BC, [facility] Pager: [phone] 12/04/2011 2:51 PM  Scribe for Dr. Delia Heady, Stroke Center Medical Director, who has personally reviewed chart, pertinent data, examined the patient and developed the plan of care. Pager:  [phone]

## 2011-12-04 NOTE — Progress Notes (Signed)
Occupational Therapy Evaluation Patient Details Name: [name] MRN: [id number] DOB: 10/24/1932 Today's Date: 12/04/2011 Time: 5366-4403 OT Time Calculation (min): 11 min  OT Assessment / Plan / Recommendation Clinical Impression  Pt admitted with blurred vision and gait instability.  At this time, symptoms have resolved.  Pt able to perform BADLs and functional mobility at I/mod I level.  PT addressing high level balance deficits (if present).  No further acute OT needs. Will sign off.    OT Assessment  Patient does not need any further OT services    Follow Up Recommendations  Supervision - Intermittent;No OT follow up    Barriers to Discharge      Equipment Recommendations  None recommended by OT    Recommendations for Other Services    Frequency       Precautions / Restrictions Precautions Precautions: Fall (fall risk slight) Restrictions Weight Bearing Restrictions: No   Pertinent Vitals/Pain See vitals    ADL  Eating/Feeding: Performed;Independent Where Assessed - Eating/Feeding:  (standing) Grooming: Performed;Brushing hair;Independent Where Assessed - Grooming: Unsupported standing Upper Body Dressing: Performed;Independent Where Assessed - Upper Body Dressing: Unsupported standing Toilet Transfer: Simulated;Modified independent Toilet Transfer Method: Sit to Barista: Regular height toilet Transfers/Ambulation Related to ADLs: Mod I during ambulation in hallway for slight increased time (pt reports feeling slightly off balance) ADL Comments: Pt ambulating around room performing ADL tasks upon OT arrival.  Pt is able to perform BADL tasks at I/mod I level within room.  Left pt ambulating with PT in hallway to perform high level balance tests.     OT Diagnosis:    OT Problem List:   OT Treatment Interventions:     OT Goals    Visit Information  Last OT Received On: 12/04/11 Assistance Needed: +1    Subjective Data      Prior  Functioning  Vision/Perception  Home Living Lives With: Spouse Available Help at Discharge: Family;Available 24 hours/day Type of Home: House Home Access: Stairs to enter [employer] of Steps: 2 Entrance Stairs-Rails: None Home Layout: Two level;Able to live on main level with bedroom/bathroom Bathroom Shower/Tub: Engineer, manufacturing systems: Standard Home Adaptive Equipment: None Prior Function Level of Independence: Independent Able to Take Stairs?: Yes Driving: Yes Vocation: Retired Musician: No difficulties   Vision - Naval architect: Within Media planner  Overall Cognitive Status: Appears within functional limits for tasks assessed/performed Arousal/Alertness: Awake/alert Orientation Level: Oriented X4 / Intact Behavior During Session: [name] for tasks performed    Extremity/Trunk Assessment Right Upper Extremity Assessment RUE ROM/Strength/Tone: [facility] for tasks assessed Left Upper Extremity Assessment LUE ROM/Strength/Tone: WFL for tasks assessed   Mobility  Shoulder Instructions  Bed Mobility Bed Mobility: Not assessed Transfers Transfers: Sit to Stand Sit to Stand: 6: Modified independent (Device/Increase time) Stand to Sit: 7: Independent Details for Transfer Assistance: Pt left ambulating unit with PT       Exercise     Balance     End of Session OT - End of Session Activity Tolerance: Patient tolerated treatment well Patient left:  (in  hallway with PT)  GO    12/04/2011 Cipriano Mile OTR/L Pager [phone] Office [phone]  Jeffrey, [name] 12/04/2011, 10:16 AM

## 2011-12-04 NOTE — Care Management Note (Signed)
    Page 1 of 1   12/04/2011     12:15:04 PM   CARE MANAGEMENT NOTE 12/04/2011  Patient:  [name], [name]   Account Number:  [id number]  Date Initiated:  12/04/2011  Documentation initiated by:  GRAVES-BIGELOW,Camrin Gearheart  Subjective/Objective Assessment:   Pt admitted with dizziness. Pt is from home with wife and plans for d/c tonight.     Action/Plan:   PT recommends Outpatient PT f/u. Pt had Rx. Pt was d/c before CM was able to see the pt and per RN pt will call the neuro rehab facility.   Anticipated DC Date:  12/04/2011   Anticipated DC Plan:  HOME/SELF CARE      DC Planning Services  CM consult      Choice offered to / List presented to:             Status of service:  Completed, signed off Medicare Important Message given?   (If response is "NO", the following Medicare IM given date fields will be blank) Date Medicare IM given:   Date Additional Medicare IM given:    Discharge Disposition:  HOME/SELF CARE  Per UR Regulation:  Reviewed for med. necessity/level of care/duration of stay  If discussed at Long Length of Stay Meetings, dates discussed:    Comments:

## 2011-12-04 NOTE — Progress Notes (Signed)
Physical Therapy Treatment Patient Details Name: [name] MRN: [id number] DOB: 04/23/1932 Today's Date: 12/04/2011 Time: 0850-0905 PT Time Calculation (min): 15 min  PT Assessment / Plan / Recommendation Comments on Treatment Session  Admitted with blurred vision and gait dyscoordination; Much improved today, with gait approaching (if not at) WNL;  Discussed Outpt PT follow-up, and option that [facility] has for an Outpt Balance Screen (also informed pt that he can chose to go to any Outpt PT clinic he wishes), and provided him with the phone number to set up an appt [phone]); Noted that MD provided a script for Outpt PT (thanks!);   OK for dc home from PT standpoint; Majority of goals met, will sign off    Follow Up Recommendations  Outpatient PT;Supervision - Intermittent    Barriers to Discharge        Equipment Recommendations  None recommended by PT (Pt can obtain cane if he wants, though not absolutely needed)    Recommendations for Other Services    Frequency Min 4X/week   Plan Discharge plan remains appropriate    Precautions / Restrictions Precautions Precautions: Fall (fall risk slight) Restrictions Weight Bearing Restrictions: No   Pertinent Vitals/Pain no apparent distress     Mobility  Transfers Transfers: Sit to Stand;Stand to Sit Sit to Stand: 7: Independent Stand to Sit: 7: Independent Details for Transfer Assistance: Smooth transitions with no apparent balance defecits Ambulation/Gait Ambulation/Gait Assistance: 5: Supervision;6: Modified independent (Device/Increase time) Ambulation Distance (Feet): 250 Feet Assistive device: None;Straight cane Ambulation/Gait Assistance Details: Ambulating well with no Loss of balance noted, including backwards walking, braiding, novel weight shifting (learning to "moon walk"); Gait coordinated today; pt's subjective report is that gait feels much better today; Correctly and appropriately used  cane Gait Pattern: Step-through pattern;Within Functional Limits Gait velocity: WFL Stairs Assistance: 5: Supervision Stairs Assistance Details (indicate cue type and reason): Able to manage steps with or without rail with reciprocal stepping safey Stair Management Technique: No rails;One rail Right;With cane Number of Stairs: 12  Modified Rankin (Stroke Patients Only) Pre-Morbid Rankin Score: No symptoms Modified Rankin: No significant disability    Exercises     PT Diagnosis:    PT Problem List:   PT Treatment Interventions:     PT Goals Acute Rehab PT Goals Time For Goal Achievement: 12/10/11 Potential to Achieve Goals: Good Pt will go Sit to Stand: with modified independence PT Goal: Sit to Stand - Progress: Met Pt will go Stand to Sit: with modified independence PT Goal: Stand to Sit - Progress: Met Pt will Ambulate: >150 feet;with modified independence;with least restrictive assistive device PT Goal: Ambulate - Progress: Met Pt will Go Up / Down Stairs: 6-9 stairs;with supervision;with rail(s) PT Goal: Up/Down Stairs - Progress: Met  Visit Information  Last PT Received On: 12/04/11 Assistance Needed: +1    Subjective Data  Subjective: Feeling much better   Cognition  Overall Cognitive Status: Appears within functional limits for tasks assessed/performed Arousal/Alertness: Awake/alert Orientation Level: Oriented X4 / Intact Behavior During Session: [facility] for tasks performed    Balance     End of Session PT - End of Session Activity Tolerance: Patient tolerated treatment well Patient left: with call bell/phone within reach;with family/visitor present (standing in room safely without a problem) Nurse Communication: Mobility status   GP Functional Assessment Tool Used: Clinical Judgement Functional Limitation: Mobility: Walking and moving around Mobility: Walking and Moving Around Current Status ([id number]): 0 percent impaired, limited or restricted Mobility: Walking  and Moving Around Goal Status [phone]): 0 percent impaired, limited or restricted Mobility: Walking and Moving Around Discharge Status [phone]): 0 percent impaired, limited or restricted   Van Clines [facility] [place], [place] [phone]  12/04/2011, 10:09 AM

## 2011-12-05 ENCOUNTER — Encounter (HOSPITAL_COMMUNITY): Payer: Self-pay

## 2011-12-11 ENCOUNTER — Ambulatory Visit: Payer: Self-pay | Admitting: Cardiology

## 2011-12-11 DIAGNOSIS — I4891 Unspecified atrial fibrillation: Secondary | ICD-10-CM

## 2011-12-11 DIAGNOSIS — Z7901 Long term (current) use of anticoagulants: Secondary | ICD-10-CM

## 2011-12-11 LAB — POCT INR: INR: 2.4

## 2011-12-12 ENCOUNTER — Encounter (HOSPITAL_COMMUNITY)
Admission: RE | Admit: 2011-12-12 | Discharge: 2011-12-12 | Disposition: A | Discharge status: Home / Self Care | Payer: Self-pay | Source: Ambulatory Visit | Attending: Cardiology | Admitting: Cardiology

## 2011-12-12 DIAGNOSIS — Z95810 Presence of automatic (implantable) cardiac defibrillator: Secondary | ICD-10-CM | POA: Insufficient documentation

## 2011-12-12 DIAGNOSIS — Z23 Encounter for immunization: Secondary | ICD-10-CM | POA: Diagnosis not present

## 2011-12-12 DIAGNOSIS — I472 Ventricular tachycardia, unspecified: Secondary | ICD-10-CM | POA: Insufficient documentation

## 2011-12-12 DIAGNOSIS — D485 Neoplasm of uncertain behavior of skin: Secondary | ICD-10-CM | POA: Diagnosis not present

## 2011-12-12 DIAGNOSIS — R0989 Other specified symptoms and signs involving the circulatory and respiratory systems: Secondary | ICD-10-CM | POA: Insufficient documentation

## 2011-12-12 DIAGNOSIS — C44519 Basal cell carcinoma of skin of other part of trunk: Secondary | ICD-10-CM | POA: Diagnosis not present

## 2011-12-12 DIAGNOSIS — I2589 Other forms of chronic ischemic heart disease: Secondary | ICD-10-CM | POA: Insufficient documentation

## 2011-12-12 DIAGNOSIS — I359 Nonrheumatic aortic valve disorder, unspecified: Secondary | ICD-10-CM | POA: Insufficient documentation

## 2011-12-12 DIAGNOSIS — L578 Other skin changes due to chronic exposure to nonionizing radiation: Secondary | ICD-10-CM | POA: Diagnosis not present

## 2011-12-12 DIAGNOSIS — I4891 Unspecified atrial fibrillation: Secondary | ICD-10-CM | POA: Insufficient documentation

## 2011-12-12 DIAGNOSIS — Z5189 Encounter for other specified aftercare: Secondary | ICD-10-CM | POA: Insufficient documentation

## 2011-12-12 DIAGNOSIS — I4729 Other ventricular tachycardia: Secondary | ICD-10-CM | POA: Insufficient documentation

## 2011-12-12 DIAGNOSIS — D1801 Hemangioma of skin and subcutaneous tissue: Secondary | ICD-10-CM | POA: Diagnosis not present

## 2011-12-12 DIAGNOSIS — Z951 Presence of aortocoronary bypass graft: Secondary | ICD-10-CM | POA: Insufficient documentation

## 2011-12-13 ENCOUNTER — Encounter (HOSPITAL_COMMUNITY)
Admission: RE | Admit: 2011-12-13 | Discharge: 2011-12-13 | Disposition: A | Discharge status: Home / Self Care | Payer: Self-pay | Source: Ambulatory Visit | Attending: Cardiology | Admitting: Cardiology

## 2011-12-14 ENCOUNTER — Encounter (HOSPITAL_COMMUNITY): Payer: Self-pay

## 2011-12-15 ENCOUNTER — Other Ambulatory Visit (INDEPENDENT_AMBULATORY_CARE_PROVIDER_SITE_OTHER): Payer: Medicare Other

## 2011-12-15 DIAGNOSIS — E785 Hyperlipidemia, unspecified: Secondary | ICD-10-CM

## 2011-12-15 DIAGNOSIS — I359 Nonrheumatic aortic valve disorder, unspecified: Secondary | ICD-10-CM | POA: Diagnosis not present

## 2011-12-15 DIAGNOSIS — I350 Nonrheumatic aortic (valve) stenosis: Secondary | ICD-10-CM

## 2011-12-15 DIAGNOSIS — I4891 Unspecified atrial fibrillation: Secondary | ICD-10-CM

## 2011-12-15 LAB — HEPATIC FUNCTION PANEL
ALT: 17 U/L (ref 0–53)
Alkaline Phosphatase: 58 U/L (ref 39–117)
Bilirubin, Direct: 0.2 mg/dL (ref 0.0–0.3)
Total Bilirubin: 0.9 mg/dL (ref 0.3–1.2)

## 2011-12-15 LAB — LIPID PANEL
LDL Cholesterol: 85 mg/dL (ref 0–99)
VLDL: 16.6 mg/dL (ref 0.0–40.0)

## 2011-12-18 ENCOUNTER — Ambulatory Visit: Payer: Self-pay

## 2011-12-18 DIAGNOSIS — I4891 Unspecified atrial fibrillation: Secondary | ICD-10-CM

## 2011-12-18 DIAGNOSIS — Z7901 Long term (current) use of anticoagulants: Secondary | ICD-10-CM

## 2011-12-19 ENCOUNTER — Encounter (HOSPITAL_COMMUNITY): Payer: Self-pay

## 2011-12-19 ENCOUNTER — Ambulatory Visit (INDEPENDENT_AMBULATORY_CARE_PROVIDER_SITE_OTHER): Payer: Medicare Other | Admitting: Cardiology

## 2011-12-19 ENCOUNTER — Encounter: Payer: Self-pay | Admitting: Cardiology

## 2011-12-19 VITALS — BP 110/60 | HR 58 | Ht 75.0 in | Wt 210.0 lb

## 2011-12-19 DIAGNOSIS — E785 Hyperlipidemia, unspecified: Secondary | ICD-10-CM

## 2011-12-19 DIAGNOSIS — Z952 Presence of prosthetic heart valve: Secondary | ICD-10-CM

## 2011-12-19 DIAGNOSIS — I4891 Unspecified atrial fibrillation: Secondary | ICD-10-CM | POA: Diagnosis not present

## 2011-12-19 DIAGNOSIS — Z954 Presence of other heart-valve replacement: Secondary | ICD-10-CM | POA: Diagnosis not present

## 2011-12-19 NOTE — Patient Instructions (Addendum)
Your physician recommends that you schedule a follow-up appointment in: 3 MONTHS  Your physician recommends that you continue on your current medications as directed. Please refer to the Current Medication list given to you today.   

## 2011-12-19 NOTE — Progress Notes (Signed)
HPI:  Patient is in for followup. He saw his primary care doctor yesterday, and they made some appropriate changes in medication. His carvedilol was cut in half because of some dizziness and hypotension. His his diuretic was changed to a when necessary basis. Dr. Timothy Lasso talked to him about taking Crestor on a daily basis. We reviewed all these in detail. The patient counts as to how much better he feels since he had his percutaneous valve placed, and he is able to do quite a few things that he was unable to achieve previously. He denies any chest pain or progressive shortness of breath.    Current Outpatient Prescriptions  Medication Sig Dispense Refill  . alfuzosin (UROXATRAL) 10 MG 24 hr tablet Take 10 mg by mouth at bedtime.       Marland Kitchen aspirin 81 MG chewable tablet Chew 81 mg by mouth daily.       . carvedilol (COREG) 12.5 MG tablet Take 6.25 mg by mouth 2 (two) times daily with a meal.       . digoxin (LANOXIN) 0.125 MG tablet Take 0.0625 mg by mouth daily.      . furosemide (LASIX) 20 MG tablet Take 20 mg by mouth as needed.       Marland Kitchen levothyroxine (SYNTHROID, LEVOTHROID) 150 MCG tablet Take 150 mcg by mouth daily.      . Multiple Vitamin (MULTIVITAMIN) tablet Take 1 tablet by mouth daily.        . nitroGLYCERIN (NITROSTAT) 0.4 MG SL tablet Place 0.4 mg under the tongue every 5 (five) minutes as needed. For chest pain      . omeprazole (PRILOSEC) 20 MG capsule Take 20 mg by mouth 2 (two) times daily.      . polyethylene glycol powder (GLYCOLAX/MIRALAX) powder Take 17 g by mouth daily as needed. For constipation      . rosuvastatin (CRESTOR) 5 MG tablet Take 5 mg by mouth daily.       Marland Kitchen warfarin (COUMADIN) 5 MG tablet Take 5 mg by mouth every evening.      . sildenafil (VIAGRA) 50 MG tablet Take 1 tablet (50 mg total) by mouth daily as needed for erectile dysfunction.  10 tablet  2    Allergies  Allergen Reactions  . Penicillins Shortness Of Breath and Rash  . Ace Inhibitors     Has not  tolerated in the past due to hyperkalemia  . Antihistamines, Diphenhydramine-Type Other (See Comments)    Inhibits urination  . Amiodarone     Past Medical History  Diagnosis Date  . Ischemic cardiomyopathy 05/2010    Has EF of 25%  . Diverticulitis     CURRENTLY CONTROLLED WITH NO EVIDENCE OF RECURRENT INFECTION  . AF (atrial fibrillation)     Has not tolerated amiodarone in the past. Amiodarone was stopped in September of 2010 due to side effects  . Chronic anticoagulation     on coumadin  . ICD (implantable cardiac defibrillator) in place   . Prostate cancer   . Osteoarthritis     RIGHT KNEE  . Aortic stenosis, severe     WITH PERCUTANEOUS AORTIC VALVE (TAVI) IN March 2012 at the [facility]  . MI (myocardial infarction) 1974, 89  . Cervical myelopathy   . Pulmonary embolism   . Hyperlipidemia   . GERD (gastroesophageal reflux disease)   . Esophageal stricture     WITH DILATATION  . NSVT (nonsustained ventricular tachycardia)   . Coronary artery disease   .  Stroke   . Heart murmur   . Pacemaker     Past Surgical History  Procedure Date  . Icd Feb 2003  . Coronary artery bypass graft 1979  . Coronary artery bypass graft 1991    REDO SURGERY  . Cardiac catheterization 2010    SEVERE LV DYSFUNCTION WITH ESTIMATED EJECTION FRACTION OF 25%  . Cholecystectomy   . Aortic valve replacement     Percutaneous AVR in March 2012 at the [facility]  . Cardioversion 02/16/2011    Procedure: CARDIOVERSION;  Surgeon: Luis Abed, MD;  Location: [facility] OR;  Service: Cardiovascular;  Laterality: N/A;  . Shoulder surgery   . Pacemaker placement     Family History  Problem Relation Age of Onset  . Heart disease Mother 68  . Diabetes Mother 69  . Heart disease Father 28    History   Social History  . Marital Status: Married    Spouse Name: N/A    Number of Children: Y  . Years of Education: N/A   Occupational History  . retired. developer.     Social  History Main Topics  . Smoking status: Former Smoker -- 4.0 packs/day for 15 years    Types: Cigarettes    Quit date: 06/15/1969  . Smokeless tobacco: Never Used  . Alcohol Use: No  . Drug Use: No  . Sexually Active: Yes   Other Topics Concern  . Not on file   Social History Narrative  . No narrative on file    ROS: Please see the HPI.  All other systems reviewed and negative.  PHYSICAL EXAM:  BP 110/60  Pulse 58  Ht 6\' 3"  (1.905 m)  Wt 210 lb (95.255 kg)  BMI 26.25 kg/m2  General: Well developed, well nourished, in no acute distress. Head:  Normocephalic and atraumatic. Neck: no JVD Lungs: Clear to auscultation and percussion. Heart:  Irregular rhythm with SEM 1/6, with early diastolic blow murmur.   Abdomen:  Normal bowel sounds; soft; non tender; no organomegaly Pulses: Pulses normal in all 4 extremities. Extremities: No clubbing or cyanosis. No edema. Neurologic: Alert and oriented x 3.  EKG:  Atrial fib with controlled ventricular response, possible ventricular ectopy vs aberration, Diffuse nonspecific ST and T changes.  Cannot exclude age indeterminate inferior and anteroseptal MI.    ASSESSMENT AND PLAN:

## 2011-12-20 ENCOUNTER — Encounter (HOSPITAL_COMMUNITY): Payer: Self-pay

## 2011-12-21 ENCOUNTER — Encounter (HOSPITAL_COMMUNITY): Payer: Self-pay

## 2011-12-26 ENCOUNTER — Encounter (HOSPITAL_COMMUNITY)
Admission: RE | Admit: 2011-12-26 | Discharge: 2011-12-26 | Disposition: A | Discharge status: Home / Self Care | Payer: Self-pay | Source: Ambulatory Visit | Attending: Cardiology | Admitting: Cardiology

## 2011-12-27 ENCOUNTER — Encounter (HOSPITAL_COMMUNITY): Payer: Self-pay

## 2011-12-28 ENCOUNTER — Encounter (HOSPITAL_COMMUNITY): Payer: Self-pay

## 2012-01-01 ENCOUNTER — Ambulatory Visit: Payer: Self-pay | Admitting: Cardiology

## 2012-01-01 DIAGNOSIS — C44519 Basal cell carcinoma of skin of other part of trunk: Secondary | ICD-10-CM | POA: Diagnosis not present

## 2012-01-01 DIAGNOSIS — I4891 Unspecified atrial fibrillation: Secondary | ICD-10-CM

## 2012-01-01 DIAGNOSIS — Z7901 Long term (current) use of anticoagulants: Secondary | ICD-10-CM

## 2012-01-01 LAB — POCT INR: INR: 3.1

## 2012-01-02 ENCOUNTER — Encounter (HOSPITAL_COMMUNITY): Payer: Self-pay

## 2012-01-02 DIAGNOSIS — I2510 Atherosclerotic heart disease of native coronary artery without angina pectoris: Secondary | ICD-10-CM | POA: Diagnosis not present

## 2012-01-02 DIAGNOSIS — I4891 Unspecified atrial fibrillation: Secondary | ICD-10-CM | POA: Diagnosis not present

## 2012-01-02 DIAGNOSIS — G459 Transient cerebral ischemic attack, unspecified: Secondary | ICD-10-CM | POA: Diagnosis not present

## 2012-01-02 DIAGNOSIS — I359 Nonrheumatic aortic valve disorder, unspecified: Secondary | ICD-10-CM | POA: Diagnosis not present

## 2012-01-03 ENCOUNTER — Encounter (HOSPITAL_COMMUNITY): Payer: Self-pay

## 2012-01-04 ENCOUNTER — Encounter (HOSPITAL_COMMUNITY): Payer: Self-pay

## 2012-01-06 NOTE — Assessment & Plan Note (Signed)
He seems to be tolerating very low-dose statin, and his LDL is not too far off target

## 2012-01-06 NOTE — Assessment & Plan Note (Signed)
He is currently rate controlled on current medical regimen

## 2012-01-06 NOTE — Assessment & Plan Note (Signed)
The patient is obviously had dramatic improvement since placement of a percutaneous aortic valve. He does have mild perivalvular leak, but not clinically significant at the present time. We will continue to follow him on a regular basis, and make appropriate followup with Dr. Excell Seltzer when I retire. I agree with the changes made by Dr. Timothy Lasso

## 2012-01-09 ENCOUNTER — Encounter (HOSPITAL_COMMUNITY): Payer: Self-pay

## 2012-01-10 ENCOUNTER — Encounter (HOSPITAL_COMMUNITY): Payer: Self-pay

## 2012-01-11 ENCOUNTER — Encounter (HOSPITAL_COMMUNITY): Payer: Self-pay

## 2012-01-15 ENCOUNTER — Ambulatory Visit: Payer: Self-pay | Admitting: Cardiovascular Disease

## 2012-01-15 DIAGNOSIS — I359 Nonrheumatic aortic valve disorder, unspecified: Secondary | ICD-10-CM | POA: Insufficient documentation

## 2012-01-15 DIAGNOSIS — Z7901 Long term (current) use of anticoagulants: Secondary | ICD-10-CM

## 2012-01-15 DIAGNOSIS — I4891 Unspecified atrial fibrillation: Secondary | ICD-10-CM | POA: Insufficient documentation

## 2012-01-15 DIAGNOSIS — Z95810 Presence of automatic (implantable) cardiac defibrillator: Secondary | ICD-10-CM | POA: Insufficient documentation

## 2012-01-15 DIAGNOSIS — I472 Ventricular tachycardia, unspecified: Secondary | ICD-10-CM | POA: Insufficient documentation

## 2012-01-15 DIAGNOSIS — R0989 Other specified symptoms and signs involving the circulatory and respiratory systems: Secondary | ICD-10-CM | POA: Insufficient documentation

## 2012-01-15 DIAGNOSIS — Z5189 Encounter for other specified aftercare: Secondary | ICD-10-CM | POA: Insufficient documentation

## 2012-01-15 DIAGNOSIS — I2589 Other forms of chronic ischemic heart disease: Secondary | ICD-10-CM | POA: Insufficient documentation

## 2012-01-15 DIAGNOSIS — I4729 Other ventricular tachycardia: Secondary | ICD-10-CM | POA: Insufficient documentation

## 2012-01-15 DIAGNOSIS — Z951 Presence of aortocoronary bypass graft: Secondary | ICD-10-CM | POA: Insufficient documentation

## 2012-01-15 LAB — POCT INR: INR: 3.4

## 2012-01-16 ENCOUNTER — Encounter (HOSPITAL_COMMUNITY)
Admission: RE | Admit: 2012-01-16 | Discharge: 2012-01-16 | Disposition: A | Discharge status: Home / Self Care | Payer: Self-pay | Source: Ambulatory Visit | Attending: Cardiology | Admitting: Cardiology

## 2012-01-17 ENCOUNTER — Encounter (HOSPITAL_COMMUNITY)
Admission: RE | Admit: 2012-01-17 | Discharge: 2012-01-17 | Disposition: A | Discharge status: Home / Self Care | Payer: Self-pay | Source: Ambulatory Visit | Attending: Cardiology | Admitting: Cardiology

## 2012-01-18 ENCOUNTER — Encounter (HOSPITAL_COMMUNITY)
Admission: RE | Admit: 2012-01-18 | Discharge: 2012-01-18 | Disposition: A | Discharge status: Home / Self Care | Payer: Self-pay | Source: Ambulatory Visit | Attending: Cardiology | Admitting: Cardiology

## 2012-01-22 ENCOUNTER — Ambulatory Visit: Payer: Self-pay | Admitting: Internal Medicine

## 2012-01-22 DIAGNOSIS — I4891 Unspecified atrial fibrillation: Secondary | ICD-10-CM | POA: Diagnosis not present

## 2012-01-22 DIAGNOSIS — Z7901 Long term (current) use of anticoagulants: Secondary | ICD-10-CM | POA: Diagnosis not present

## 2012-01-23 ENCOUNTER — Telehealth: Payer: Self-pay | Admitting: Cardiology

## 2012-01-23 ENCOUNTER — Encounter: Payer: Self-pay | Admitting: *Deleted

## 2012-01-23 ENCOUNTER — Encounter (HOSPITAL_COMMUNITY): Payer: Self-pay

## 2012-01-23 NOTE — Telephone Encounter (Signed)
error 

## 2012-01-24 ENCOUNTER — Encounter (HOSPITAL_COMMUNITY): Payer: Self-pay

## 2012-01-25 ENCOUNTER — Encounter (HOSPITAL_COMMUNITY): Payer: Self-pay

## 2012-01-26 DIAGNOSIS — M25569 Pain in unspecified knee: Secondary | ICD-10-CM | POA: Diagnosis not present

## 2012-01-29 ENCOUNTER — Ambulatory Visit: Payer: Self-pay | Admitting: Cardiology

## 2012-01-29 DIAGNOSIS — Z7901 Long term (current) use of anticoagulants: Secondary | ICD-10-CM

## 2012-01-29 DIAGNOSIS — I4891 Unspecified atrial fibrillation: Secondary | ICD-10-CM

## 2012-01-29 LAB — POCT INR: INR: 2.6

## 2012-01-30 ENCOUNTER — Encounter: Payer: Self-pay | Admitting: Internal Medicine

## 2012-01-30 ENCOUNTER — Encounter (HOSPITAL_COMMUNITY): Payer: Self-pay

## 2012-01-30 ENCOUNTER — Ambulatory Visit (INDEPENDENT_AMBULATORY_CARE_PROVIDER_SITE_OTHER): Payer: Medicare Other | Admitting: Internal Medicine

## 2012-01-30 VITALS — BP 133/77 | HR 63 | Resp 18 | Ht 75.0 in | Wt 212.2 lb

## 2012-01-30 DIAGNOSIS — I472 Ventricular tachycardia, unspecified: Secondary | ICD-10-CM

## 2012-01-30 DIAGNOSIS — R05 Cough: Secondary | ICD-10-CM

## 2012-01-30 DIAGNOSIS — Z95810 Presence of automatic (implantable) cardiac defibrillator: Secondary | ICD-10-CM | POA: Diagnosis not present

## 2012-01-30 DIAGNOSIS — R053 Chronic cough: Secondary | ICD-10-CM

## 2012-01-30 DIAGNOSIS — I4891 Unspecified atrial fibrillation: Secondary | ICD-10-CM

## 2012-01-30 DIAGNOSIS — H538 Other visual disturbances: Secondary | ICD-10-CM

## 2012-01-30 DIAGNOSIS — I482 Chronic atrial fibrillation, unspecified: Secondary | ICD-10-CM

## 2012-01-30 LAB — ICD DEVICE OBSERVATION
BATTERY VOLTAGE: 2.66 V
FVT: 0
PACEART VT: 2
TZAT-0004FASTVT: 8
TZAT-0004SLOWVT: 6
TZAT-0004SLOWVT: 8
TZAT-0005FASTVT: 88 pct
TZAT-0005SLOWVT: 84 pct
TZAT-0005SLOWVT: 91 pct
TZAT-0011FASTVT: 10 ms
TZAT-0011SLOWVT: 10 ms
TZAT-0011SLOWVT: 10 ms
TZAT-0012FASTVT: 200 ms
TZAT-0020FASTVT: 1.6 ms
TZON-0003FASTVT: 250 ms
TZON-0005SLOWVT: 12
TZST-0001FASTVT: 4
TZST-0001SLOWVT: 4
TZST-0001SLOWVT: 6
TZST-0003FASTVT: 22 J
TZST-0003FASTVT: 35 J
TZST-0003FASTVT: 35 J
TZST-0003SLOWVT: 35 J
TZST-0003SLOWVT: 5 J

## 2012-01-30 NOTE — Patient Instructions (Signed)
Stop Digoxin  Follow up with Dr Graciela Husbands in 8 weeks.

## 2012-01-30 NOTE — Assessment & Plan Note (Signed)
The patient has a TIA in the setting of adequate anticoagulation. We will to review and agreed on a target INR. I would recommend that this be 2.5-3.5 history of 2.5-3.0. I also don't know that the aspirin is necessary. I will need to review this with Dr. Excell Seltzer as it could be potentially contributing to risk without necessarily significant benefit in the setting of his TAVR

## 2012-01-30 NOTE — Progress Notes (Signed)
Patient Care Team: Gwen Pounds, MD as PCP - General (Internal Medicine)   HPI  [name] is a 78 y.o. male seen in followup for ischemic cardiomyopathy for which he underwent ICD implantation for primary prevention. He has had appropriate therapy for ventricular tachycardia.  He is status post bypass surgery.  And underwent percutaneous aortic valve replacement. He  has done [employer].  He also has a history of atrial fibrillation for which amiodarone was initiated. He was intolerant of this and this has been discontinued.   There have been a couple of issues. The first is in recurrent problems with coughing. When his complaint was brought to the attention to [facility] clinic he underwent esophagoscopy demonstrating fungal infection. This was treated. The patient thought was related to his beta blocker although he does note that his postprandial.  He has also had problems with his eyes which improved with the decrease in the dose of his digoxin. He describes this as being momentary difficulties focusing.    Past Medical History  Diagnosis Date  . Ischemic cardiomyopathy 05/2010    Has EF of 25%  . Diverticulitis     CURRENTLY CONTROLLED WITH NO EVIDENCE OF RECURRENT INFECTION  . AF (atrial fibrillation)     Has not tolerated amiodarone in the past. Amiodarone was stopped in September of 2010 due to side effects  . Chronic anticoagulation     on coumadin  . ICD (implantable cardiac defibrillator) in place   . Prostate cancer   . Osteoarthritis     RIGHT KNEE  . Aortic stenosis, severe     WITH PERCUTANEOUS AORTIC VALVE (TAVI) IN March 2012 at the [facility]  . MI (myocardial infarction) 1974, 82  . Cervical myelopathy   . Pulmonary embolism   . Hyperlipidemia   . GERD (gastroesophageal reflux disease)   . Esophageal stricture     WITH DILATATION  . NSVT (nonsustained ventricular tachycardia)   . Coronary artery disease   . Stroke   . Heart murmur   .  Pacemaker     Past Surgical History  Procedure Date  . Icd Feb 2003  . Coronary artery bypass graft 1979  . Coronary artery bypass graft 1991    REDO SURGERY  . Cardiac catheterization 2010    SEVERE LV DYSFUNCTION WITH ESTIMATED EJECTION FRACTION OF 25%  . Cholecystectomy   . Aortic valve replacement     Percutaneous AVR in March 2012 at the [facility]  . Cardioversion 02/16/2011    Procedure: CARDIOVERSION;  Surgeon: Luis Abed, MD;  [facility];  Service: Cardiovascular;  Laterality: N/A;  . Shoulder surgery   . Pacemaker placement     Current Outpatient Prescriptions  Medication Sig Dispense Refill  . alfuzosin (UROXATRAL) 10 MG 24 hr tablet Take 10 mg by mouth at bedtime.       Marland Kitchen aspirin 81 MG chewable tablet Chew 81 mg by mouth daily.       . carvedilol (COREG) 12.5 MG tablet Take 6.25 mg by mouth 2 (two) times daily with a meal.       . digoxin (LANOXIN) 0.125 MG tablet Take 0.0625 mg by mouth daily.      . furosemide (LASIX) 20 MG tablet Take 20 mg by mouth as needed.       Marland Kitchen levothyroxine (SYNTHROID, LEVOTHROID) 150 MCG tablet Take 150 mcg by mouth daily.      . Multiple Vitamin (MULTIVITAMIN) tablet Take 1 tablet by mouth  daily.        . nitroGLYCERIN (NITROSTAT) 0.4 MG SL tablet Place 0.4 mg under the tongue every 5 (five) minutes as needed. For chest pain      . omeprazole (PRILOSEC) 20 MG capsule Take 20 mg by mouth 2 (two) times daily.      . polyethylene glycol powder (GLYCOLAX/MIRALAX) powder Take 17 g by mouth daily as needed. For constipation      . rosuvastatin (CRESTOR) 5 MG tablet Take 5 mg by mouth daily.       . sildenafil (VIAGRA) 50 MG tablet Take 50 mg by mouth daily as needed.      . warfarin (COUMADIN) 5 MG tablet Take 5 mg by mouth every evening.        Allergies  Allergen Reactions  . Penicillins Shortness Of Breath and Rash  . Ace Inhibitors     Has not tolerated in the past due to hyperkalemia  . Antihistamines,  Diphenhydramine-Type Other (See Comments)    Inhibits urination  . Amiodarone     Review of Systems negative except from HPI and PMH  Physical Exam BP 133/77  Pulse 63  Resp 18  Ht 6\' 3"  (1.905 m)  Wt 212 lb 3.2 oz (96.253 kg)  BMI 26.52 kg/m2 Well developed and well nourished in no acute distress HENT normal E scleral and icterus clear Neck Supple JVP flat; carotids brisk and full Clear to ausculation Regular rate and rhythm, no murmurs gallops or rub Soft with active bowel sounds No clubbing cyanosis none Edema Alert and oriented, grossly normal motor and sensory function Skin Warm and Dry    Assessment and  Plan

## 2012-01-30 NOTE — Assessment & Plan Note (Signed)
The patient has these unusual visual symptoms that improved with the down titration of his digoxin. Will have him hold it for 4 weeks and see if the symptoms resolve.

## 2012-01-30 NOTE — Assessment & Plan Note (Signed)
2 intercurrent episodes of pace terminated VT

## 2012-01-30 NOTE — Assessment & Plan Note (Signed)
The issue is raised as to whether this is related to his beta blocker or the previously identified fungal infection. I will discuss with Dr. Timothy Lasso the possibility of referral to GI for further evaluation of this. Once that evaluation is complete, I would undertake a withdrawal/exchange trial for his Coreg for different beta blocker his bisoprolol or nebivolol

## 2012-01-30 NOTE — Assessment & Plan Note (Signed)
The patient's device was interrogated.  The information was reviewed. No changes were made in the programming.    

## 2012-01-31 ENCOUNTER — Encounter (HOSPITAL_COMMUNITY): Payer: Self-pay

## 2012-02-01 ENCOUNTER — Encounter (HOSPITAL_COMMUNITY): Payer: Self-pay

## 2012-02-05 ENCOUNTER — Ambulatory Visit: Payer: Self-pay | Admitting: Cardiovascular Disease

## 2012-02-05 DIAGNOSIS — I4891 Unspecified atrial fibrillation: Secondary | ICD-10-CM | POA: Diagnosis not present

## 2012-02-05 DIAGNOSIS — Z7901 Long term (current) use of anticoagulants: Secondary | ICD-10-CM | POA: Diagnosis not present

## 2012-02-05 DIAGNOSIS — I482 Chronic atrial fibrillation, unspecified: Secondary | ICD-10-CM

## 2012-02-05 LAB — POCT INR: INR: 2.8

## 2012-02-06 ENCOUNTER — Encounter (HOSPITAL_COMMUNITY): Payer: Self-pay

## 2012-02-07 ENCOUNTER — Encounter (HOSPITAL_COMMUNITY): Payer: Self-pay

## 2012-02-09 ENCOUNTER — Other Ambulatory Visit: Payer: Self-pay

## 2012-02-09 MED ORDER — LEVOTHYROXINE SODIUM 150 MCG PO TABS: 150.0000 ug | ORAL_TABLET | Freq: Every day | ORAL | Status: DC

## 2012-02-12 ENCOUNTER — Ambulatory Visit (INDEPENDENT_AMBULATORY_CARE_PROVIDER_SITE_OTHER): Payer: Medicare Other | Admitting: Cardiovascular Disease

## 2012-02-12 DIAGNOSIS — Z7901 Long term (current) use of anticoagulants: Secondary | ICD-10-CM

## 2012-02-12 DIAGNOSIS — I4891 Unspecified atrial fibrillation: Secondary | ICD-10-CM | POA: Diagnosis not present

## 2012-02-12 DIAGNOSIS — I482 Chronic atrial fibrillation, unspecified: Secondary | ICD-10-CM

## 2012-02-12 DIAGNOSIS — Z5181 Encounter for therapeutic drug level monitoring: Secondary | ICD-10-CM

## 2012-02-12 LAB — POCT INR: INR: 2.7

## 2012-02-13 ENCOUNTER — Encounter (HOSPITAL_COMMUNITY): Payer: Medicare Other | Attending: Cardiology

## 2012-02-13 DIAGNOSIS — I2589 Other forms of chronic ischemic heart disease: Secondary | ICD-10-CM | POA: Insufficient documentation

## 2012-02-13 DIAGNOSIS — I472 Ventricular tachycardia, unspecified: Secondary | ICD-10-CM | POA: Insufficient documentation

## 2012-02-13 DIAGNOSIS — Z951 Presence of aortocoronary bypass graft: Secondary | ICD-10-CM | POA: Insufficient documentation

## 2012-02-13 DIAGNOSIS — R0989 Other specified symptoms and signs involving the circulatory and respiratory systems: Secondary | ICD-10-CM | POA: Insufficient documentation

## 2012-02-13 DIAGNOSIS — I4891 Unspecified atrial fibrillation: Secondary | ICD-10-CM | POA: Insufficient documentation

## 2012-02-13 DIAGNOSIS — I359 Nonrheumatic aortic valve disorder, unspecified: Secondary | ICD-10-CM | POA: Insufficient documentation

## 2012-02-13 DIAGNOSIS — Z95810 Presence of automatic (implantable) cardiac defibrillator: Secondary | ICD-10-CM | POA: Insufficient documentation

## 2012-02-13 DIAGNOSIS — I4729 Other ventricular tachycardia: Secondary | ICD-10-CM | POA: Insufficient documentation

## 2012-02-13 DIAGNOSIS — Z5189 Encounter for other specified aftercare: Secondary | ICD-10-CM | POA: Insufficient documentation

## 2012-02-14 ENCOUNTER — Encounter (HOSPITAL_COMMUNITY): Payer: Medicare Other

## 2012-02-15 ENCOUNTER — Encounter (HOSPITAL_COMMUNITY): Payer: Medicare Other

## 2012-02-17 ENCOUNTER — Encounter (HOSPITAL_COMMUNITY): Payer: Self-pay | Admitting: Emergency Medicine

## 2012-02-17 ENCOUNTER — Emergency Department (HOSPITAL_COMMUNITY)
Admission: EM | Admit: 2012-02-17 | Discharge: 2012-02-17 | Disposition: A | Discharge status: Home / Self Care | Payer: Medicare Other | Attending: Emergency Medicine | Admitting: Emergency Medicine

## 2012-02-17 ENCOUNTER — Emergency Department (HOSPITAL_COMMUNITY): Payer: Medicare Other

## 2012-02-17 DIAGNOSIS — I2510 Atherosclerotic heart disease of native coronary artery without angina pectoris: Secondary | ICD-10-CM | POA: Diagnosis not present

## 2012-02-17 DIAGNOSIS — Z87891 Personal history of nicotine dependence: Secondary | ICD-10-CM | POA: Diagnosis not present

## 2012-02-17 DIAGNOSIS — Z8673 Personal history of transient ischemic attack (TIA), and cerebral infarction without residual deficits: Secondary | ICD-10-CM | POA: Insufficient documentation

## 2012-02-17 DIAGNOSIS — Z8042 Family history of malignant neoplasm of prostate: Secondary | ICD-10-CM | POA: Diagnosis not present

## 2012-02-17 DIAGNOSIS — J8410 Pulmonary fibrosis, unspecified: Secondary | ICD-10-CM | POA: Diagnosis not present

## 2012-02-17 DIAGNOSIS — M5000 Cervical disc disorder with myelopathy, unspecified cervical region: Secondary | ICD-10-CM | POA: Diagnosis not present

## 2012-02-17 DIAGNOSIS — Z95818 Presence of other cardiac implants and grafts: Secondary | ICD-10-CM | POA: Insufficient documentation

## 2012-02-17 DIAGNOSIS — Z8719 Personal history of other diseases of the digestive system: Secondary | ICD-10-CM | POA: Diagnosis not present

## 2012-02-17 DIAGNOSIS — J9819 Other pulmonary collapse: Secondary | ICD-10-CM | POA: Diagnosis not present

## 2012-02-17 DIAGNOSIS — Z7982 Long term (current) use of aspirin: Secondary | ICD-10-CM | POA: Insufficient documentation

## 2012-02-17 DIAGNOSIS — Z862 Personal history of diseases of the blood and blood-forming organs and certain disorders involving the immune mechanism: Secondary | ICD-10-CM | POA: Insufficient documentation

## 2012-02-17 DIAGNOSIS — Z86711 Personal history of pulmonary embolism: Secondary | ICD-10-CM | POA: Insufficient documentation

## 2012-02-17 DIAGNOSIS — E785 Hyperlipidemia, unspecified: Secondary | ICD-10-CM | POA: Diagnosis not present

## 2012-02-17 DIAGNOSIS — I359 Nonrheumatic aortic valve disorder, unspecified: Secondary | ICD-10-CM | POA: Diagnosis not present

## 2012-02-17 DIAGNOSIS — E7089 Other disorders of aromatic amino-acid metabolism: Secondary | ICD-10-CM | POA: Insufficient documentation

## 2012-02-17 DIAGNOSIS — Z95810 Presence of automatic (implantable) cardiac defibrillator: Secondary | ICD-10-CM | POA: Diagnosis not present

## 2012-02-17 DIAGNOSIS — Z8679 Personal history of other diseases of the circulatory system: Secondary | ICD-10-CM | POA: Diagnosis not present

## 2012-02-17 DIAGNOSIS — R0789 Other chest pain: Secondary | ICD-10-CM | POA: Insufficient documentation

## 2012-02-17 DIAGNOSIS — Z79899 Other long term (current) drug therapy: Secondary | ICD-10-CM | POA: Insufficient documentation

## 2012-02-17 DIAGNOSIS — Z950 Presence of cardiac pacemaker: Secondary | ICD-10-CM | POA: Insufficient documentation

## 2012-02-17 DIAGNOSIS — Z7901 Long term (current) use of anticoagulants: Secondary | ICD-10-CM | POA: Insufficient documentation

## 2012-02-17 DIAGNOSIS — K219 Gastro-esophageal reflux disease without esophagitis: Secondary | ICD-10-CM | POA: Insufficient documentation

## 2012-02-17 DIAGNOSIS — I2589 Other forms of chronic ischemic heart disease: Secondary | ICD-10-CM | POA: Insufficient documentation

## 2012-02-17 DIAGNOSIS — R079 Chest pain, unspecified: Secondary | ICD-10-CM

## 2012-02-17 DIAGNOSIS — R071 Chest pain on breathing: Secondary | ICD-10-CM | POA: Diagnosis not present

## 2012-02-17 DIAGNOSIS — Z9889 Other specified postprocedural states: Secondary | ICD-10-CM | POA: Insufficient documentation

## 2012-02-17 LAB — CBC WITH DIFFERENTIAL/PLATELET
Basophils Absolute: 0 10*3/uL (ref 0.0–0.1)
Basophils Relative: 1 % (ref 0–1)
Eosinophils Absolute: 0.3 10*3/uL (ref 0.0–0.7)
Eosinophils Relative: 3 % (ref 0–5)
Lymphs Abs: 1.1 10*3/uL (ref 0.7–4.0)
MCH: 31.1 pg (ref 26.0–34.0)
MCHC: 33.9 g/dL (ref 30.0–36.0)
MCV: 91.7 fL (ref 78.0–100.0)
Platelets: 158 10*3/uL (ref 150–400)
RDW: 13.5 % (ref 11.5–15.5)

## 2012-02-17 LAB — POCT I-STAT, CHEM 8
BUN: 15 mg/dL (ref 6–23)
Creatinine, Ser: 1 mg/dL (ref 0.50–1.35)
Sodium: 134 mEq/L — ABNORMAL LOW (ref 135–145)
TCO2: 27 mmol/L (ref 0–100)

## 2012-02-17 LAB — URINALYSIS, ROUTINE W REFLEX MICROSCOPIC
Glucose, UA: NEGATIVE mg/dL
Ketones, ur: NEGATIVE mg/dL
Leukocytes, UA: NEGATIVE
Protein, ur: NEGATIVE mg/dL

## 2012-02-17 LAB — POCT I-STAT TROPONIN I

## 2012-02-17 MED ORDER — IOHEXOL 350 MG/ML SOLN
80.0000 mL | Freq: Once | INTRAVENOUS | Status: AC | PRN
  Administered 2012-02-17: 80 mL via INTRAVENOUS

## 2012-02-17 NOTE — ED Notes (Signed)
Pt. Stated, I've had pain like this before and it was an embolism.

## 2012-02-17 NOTE — ED Provider Notes (Signed)
History     CSN: [id number]  Arrival date & time 02/17/12  1001   First MD Initiated Contact with Patient 02/17/12 1013      Chief Complaint  Patient presents with  . Chest Pain    (Consider location/radiation/quality/duration/timing/severity/associated sxs/prior treatment) HPI Comments: Patient is a 78 year old man who has a complex medical history. He has had coronary artery disease and has had 2 coronary artery bypass grafting procedures as well as a percutaneous aortic valve inserted about a year ago. He's also had pulmonary emboli. Last evening he developed a pain in his right anterior chest it seemed to go into his right upper back. This started around 6 PM in the back pain started maybe 9 or 10 PM he took some Tylenol went to bed. He woke up at 5 this morning and the pain was quite sharp in his back. He therefore called his internist, Creola Corn M.D., and Dr. Ferd Hibbs coverage a device to coming to the hospital and having a CT angiogram to check for recurrent pulmonary embolism.  Patient is a 78 y.o. male presenting with chest pain. The history is provided by the patient. No language interpreter was used.  Chest Pain The chest pain began 12 - 24 hours ago. Duration of episode(s) is 30 minutes. Chest pain occurs intermittently. The chest pain is worsening. Associated with: Nothing. Pain severity now: Pain at five this  morning was fairly severe, but he is not currently in pain. The quality of the pain is described as sharp. The pain does not radiate. Exacerbated by: Nothing. Pertinent negatives for primary symptoms include no fever, no syncope, no shortness of breath, no cough, no palpitations, no nausea and no vomiting.     Past Medical History  Diagnosis Date  . Ischemic cardiomyopathy 05/2010    Has EF of 25%  . Diverticulitis     CURRENTLY CONTROLLED WITH NO EVIDENCE OF RECURRENT INFECTION  . AF (atrial fibrillation)     Has not tolerated amiodarone in the past. Amiodarone was  stopped in September of 2010 due to side effects  . Chronic anticoagulation     on coumadin  . ICD (implantable cardiac defibrillator) in place   . Prostate cancer   . Osteoarthritis     RIGHT KNEE  . Aortic stenosis, severe     WITH PERCUTANEOUS AORTIC VALVE (TAVI) IN March 2012 at the [facility]  . MI (myocardial infarction) 1974, 23  . Cervical myelopathy   . Pulmonary embolism   . Hyperlipidemia   . GERD (gastroesophageal reflux disease)   . Esophageal stricture     WITH DILATATION  . NSVT (nonsustained ventricular tachycardia)   . Coronary artery disease   . Stroke   . Heart murmur   . Pacemaker     Past Surgical History  Procedure Date  . Icd Feb 2003  . Coronary artery bypass graft 1979  . Coronary artery bypass graft 1991    REDO SURGERY  . Cardiac catheterization 2010    SEVERE LV DYSFUNCTION WITH ESTIMATED EJECTION FRACTION OF 25%  . Cholecystectomy   . Aortic valve replacement     Percutaneous AVR in March 2012 at the [facility]  . Cardioversion 02/16/2011    Procedure: CARDIOVERSION;  Surgeon: Luis Abed, MD;  Location: [facility] OR;  Service: Cardiovascular;  Laterality: N/A;  . Shoulder surgery   . Pacemaker placement     Family History  Problem Relation Age of Onset  . Heart disease Mother 14  .  Diabetes Mother 46  . Heart disease Father 61    History  Substance Use Topics  . Smoking status: Former Smoker -- 4.0 packs/day for 15 years    Types: Cigarettes    Quit date: 06/15/1969  . Smokeless tobacco: Never Used  . Alcohol Use: No      Review of Systems  Constitutional: Negative for fever and chills.  HENT: Negative.   Eyes: Negative.   Respiratory: Negative.  Negative for cough and shortness of breath.   Cardiovascular: Positive for chest pain. Negative for palpitations and syncope.  Gastrointestinal: Negative.  Negative for nausea and vomiting.  Genitourinary: Negative.   Musculoskeletal: Negative.   Skin: Negative.    Neurological: Negative.   Psychiatric/Behavioral: Negative.     Allergies  Penicillins; Ace inhibitors; Antihistamines, diphenhydramine-type; and Amiodarone  Home Medications   Current Outpatient Rx  Name  Route  Sig  Dispense  Refill  . ALFUZOSIN HCL ER 10 MG PO TB24   Oral   Take 10 mg by mouth at bedtime.          . ASPIRIN 81 MG PO CHEW   Oral   Chew 81 mg by mouth daily.          Marland Kitchen CARVEDILOL 12.5 MG PO TABS   Oral   Take 6.25 mg by mouth 2 (two) times daily with a meal.          . DIGOXIN 0.125 MG PO TABS               . FUROSEMIDE 20 MG PO TABS   Oral   Take 20 mg by mouth as needed.          Marland Kitchen LEVOTHYROXINE SODIUM 150 MCG PO TABS   Oral   Take 1 tablet (150 mcg total) by mouth daily.   90 tablet   1   . LISINOPRIL 2.5 MG PO TABS               . ONE-DAILY MULTI VITAMINS PO TABS   Oral   Take 1 tablet by mouth daily.           Marland Kitchen NITROGLYCERIN 0.4 MG SL SUBL   Sublingual   Place 0.4 mg under the tongue every 5 (five) minutes as needed. For chest pain         . OMEPRAZOLE 20 MG PO CPDR   Oral   Take 20 mg by mouth 2 (two) times daily.         Marland Kitchen POLYETHYLENE GLYCOL 3350 PO POWD   Oral   Take 17 g by mouth daily as needed. For constipation         . RESTASIS 0.05 % OP EMUL               . ROSUVASTATIN CALCIUM 5 MG PO TABS   Oral   Take 5 mg by mouth daily.          Marland Kitchen SILDENAFIL CITRATE 50 MG PO TABS   Oral   Take 50 mg by mouth daily as needed.         . WARFARIN SODIUM 5 MG PO TABS   Oral   Take 5 mg by mouth every evening.           BP 133/76  Pulse 69  Temp 97.5 F (36.4 C) (Oral)  Resp 16  SpO2 98%  Physical Exam  Nursing note and vitals reviewed. Constitutional: He is oriented to person, place, and time.       `  Pleasant elderly man in no distress.  HENT:  Head: Normocephalic and atraumatic.  Right Ear: External ear normal.  Left Ear: External ear normal.  Mouth/Throat: Oropharynx is clear  and moist.  Eyes: Conjunctivae normal and EOM are normal. Pupils are equal, round, and reactive to light.  Neck: Normal range of motion. Neck supple.  Cardiovascular: Normal rate.        Irregular heart rhythm.  Pulmonary/Chest: Effort normal and breath sounds normal.  Abdominal: Soft. Bowel sounds are normal.  Musculoskeletal: Normal range of motion. He exhibits no edema and no tenderness.  Neurological: He is alert and oriented to person, place, and time.       No sensory or motor deficit.  Skin: Skin is warm and dry.  Psychiatric: He has a normal mood and affect. His behavior is normal.    ED Course  Procedures (including critical care time)  10:14 AM  Date: 02/17/2012  Rate: 65  Rhythm: atrial fibrillation  QRS Axis: normal  Intervals: normal QRS:  Poor R wave progression in precordial leads suggests old anterior myocardial infarction.  ST/T Wave abnormalities:   Inverted T waves in lateral precordial leads.  Conduction Disutrbances:none  Narrative Interpretation: Abnormal EKG  Old EKG Reviewed: unchanged  10:49 AM Pt seen --> physical exam performed.  Lab workup and CT angio of chest ordered.  EKG shows old anterior MI's, no acute changes.   3:04 PM Results for orders placed during the hospital encounter of 02/17/12  CBC WITH DIFFERENTIAL      Component Value Range   WBC 8.5  4.0 - 10.5 K/uL   RBC 4.47  4.22 - 5.81 MIL/uL   Hemoglobin 13.9  13.0 - 17.0 g/dL   HCT 40.9  81.1 - 91.4 %   MCV 91.7  78.0 - 100.0 fL   MCH 31.1  26.0 - 34.0 pg   MCHC 33.9  30.0 - 36.0 g/dL   RDW 78.2  95.6 - 21.3 %   Platelets 158  150 - 400 K/uL   Neutrophils Relative 74  43 - 77 %   Neutro Abs 6.3  1.7 - 7.7 K/uL   Lymphocytes Relative 13  12 - 46 %   Lymphs Abs 1.1  0.7 - 4.0 K/uL   Monocytes Relative 10  3 - 12 %   Monocytes Absolute 0.8  0.1 - 1.0 K/uL   Eosinophils Relative 3  0 - 5 %   Eosinophils Absolute 0.3  0.0 - 0.7 K/uL   Basophils Relative 1  0 - 1 %   Basophils  Absolute 0.0  0.0 - 0.1 K/uL  URINALYSIS, ROUTINE W REFLEX MICROSCOPIC      Component Value Range   Color, Urine YELLOW  YELLOW   APPearance CLEAR  CLEAR   Specific Gravity, Urine 1.016  1.005 - 1.030   pH 6.0  5.0 - 8.0   Glucose, UA NEGATIVE  NEGATIVE mg/dL   Hgb urine dipstick NEGATIVE  NEGATIVE   Bilirubin Urine NEGATIVE  NEGATIVE   Ketones, ur NEGATIVE  NEGATIVE mg/dL   Protein, ur NEGATIVE  NEGATIVE mg/dL   Urobilinogen, UA 0.2  0.0 - 1.0 mg/dL   Nitrite NEGATIVE  NEGATIVE   Leukocytes, UA NEGATIVE  NEGATIVE  PROTIME-INR      Component Value Range   Prothrombin Time 28.6 (*) 11.6 - 15.2 seconds   INR 2.87 (*) 0.00 - 1.49  APTT      Component Value Range   aPTT 43 (*) 24 -  37 seconds  POCT I-STAT, CHEM 8      Component Value Range   Sodium 134 (*) 135 - 145 mEq/L   Potassium 4.7  3.5 - 5.1 mEq/L   Chloride 101  96 - 112 mEq/L   BUN 15  6 - 23 mg/dL   Creatinine, Ser 1.02  0.50 - 1.35 mg/dL   Glucose, Bld 95  70 - 99 mg/dL   Calcium, Ion 7.25  3.66 - 1.30 mmol/L   TCO2 27  0 - 100 mmol/L   Hemoglobin 14.6  13.0 - 17.0 g/dL   HCT 44.0  34.7 - 42.5 %  POCT I-STAT TROPONIN I      Component Value Range   Troponin i, poc 0.01  0.00 - 0.08 ng/mL   Comment 3            Dg Chest 2 View  02/17/2012  *RADIOLOGY REPORT*  Clinical Data: Chest pain.  CHEST - 2 VIEW  Comparison: 12/02/2011.  Findings: The cardiac silhouette, mediastinal and hilar contours are stable.  Permanent right ventricular pacer wire / AICD is unchanged.  The lungs demonstrate streaky bibasilar atelectasis without definite effusion.  A metallic stent is noted at the aortic root.  IMPRESSION: Streaky bibasilar atelectasis.   Original Report Authenticated By: Rudie Meyer, M.D.    Ct Angio Chest Pe W/cm &/or Wo Cm  02/17/2012  *RADIOLOGY REPORT*  Clinical Data: Right-sided chest pain.  CT ANGIOGRAPHY CHEST  Technique:  Multidetector CT imaging of the chest using the standard protocol during bolus administration  of intravenous contrast. Multiplanar reconstructed images including MIPs were obtained and reviewed to evaluate the vascular anatomy.  Contrast: 80mL OMNIPAQUE IOHEXOL 350 MG/ML SOLN  Comparison: None  Findings: The chest wall is unremarkable.  A left-sided permanent pacemaker is noted.  There are chest wall collateral vessels noted on the left which could be due to left subclavian stenosis.  No supraclavicular or axillary mass or adenopathy.  The bony thorax is intact.  No destructive bone lesions or spinal canal compromise. Moderate degenerative changes throughout the spine.  There are surgical changes from cardiac surgery with median sternotomy wires.  The heart is normal in size.  No pericardial effusion.  No mediastinal or hilar lymphadenopathy.  Small scattered nodes are noted.  The aorta is normal in caliber.  Surgical changes from an aortic valve replacement are noted.  There are markedly densely calcified coronary arteries.  The esophagus is grossly normal.  The pulmonary arterial tree is fairly well opacified.  No filling defects to suggest pulmonary emboli.  Examination of the lung parenchyma demonstrates patchy areas of subsegmental atelectasis and scarring.  No edema, infiltrates or effusions.  No worrisome pulmonary mass lesions.  The upper abdomen is unremarkable.  IMPRESSION:  1.  No CT findings for pulmonary embolism. 2.  Surgical changes related to bypass surgery and aortic valve replacement surgery.  The aorta is unremarkable. 3.  Dense coronary artery calcifications. 4.  Patchy areas of atelectasis, scarring change and fibrosis but no significant acute pulmonary findings.   Original Report Authenticated By: Rudie Meyer, M.D.     Lab tests and CT angio of chest were good, showing no MI and no PE.  Call to [facility] -->3:17 PM Case discussed with Guerry Bruin, M.D., covering for Dr. Timothy Lasso.  Where pt's tests are good, it is safe to go home and to followup in the office next  week.   1. Chest pain  Carleene Cooper III, MD 02/17/12 [phone]

## 2012-02-17 NOTE — ED Notes (Signed)
Pt. Stated, I started having a stabbing pain on the rt. Side of the chest and goes to the back, this started yesterday.  I did have have an episode of nausea.

## 2012-02-20 ENCOUNTER — Encounter (HOSPITAL_COMMUNITY): Payer: Medicare Other

## 2012-02-20 DIAGNOSIS — R079 Chest pain, unspecified: Secondary | ICD-10-CM | POA: Diagnosis not present

## 2012-02-20 DIAGNOSIS — R05 Cough: Secondary | ICD-10-CM | POA: Diagnosis not present

## 2012-02-20 DIAGNOSIS — I4891 Unspecified atrial fibrillation: Secondary | ICD-10-CM | POA: Diagnosis not present

## 2012-02-20 DIAGNOSIS — G459 Transient cerebral ischemic attack, unspecified: Secondary | ICD-10-CM | POA: Diagnosis not present

## 2012-02-20 DIAGNOSIS — R1310 Dysphagia, unspecified: Secondary | ICD-10-CM | POA: Diagnosis not present

## 2012-02-21 ENCOUNTER — Encounter (HOSPITAL_COMMUNITY): Payer: Medicare Other

## 2012-02-22 ENCOUNTER — Encounter (HOSPITAL_COMMUNITY): Payer: Medicare Other

## 2012-02-26 ENCOUNTER — Ambulatory Visit: Payer: Self-pay | Admitting: Internal Medicine

## 2012-02-26 DIAGNOSIS — Z7901 Long term (current) use of anticoagulants: Secondary | ICD-10-CM

## 2012-02-26 DIAGNOSIS — I482 Chronic atrial fibrillation, unspecified: Secondary | ICD-10-CM

## 2012-02-26 LAB — POCT INR: INR: 3.9

## 2012-02-27 ENCOUNTER — Encounter (HOSPITAL_COMMUNITY): Payer: Medicare Other

## 2012-02-28 ENCOUNTER — Encounter (HOSPITAL_COMMUNITY): Payer: Medicare Other

## 2012-02-29 ENCOUNTER — Encounter (HOSPITAL_COMMUNITY): Payer: Medicare Other

## 2012-03-04 ENCOUNTER — Ambulatory Visit: Payer: Self-pay | Admitting: Cardiology

## 2012-03-04 DIAGNOSIS — K228 Other specified diseases of esophagus: Secondary | ICD-10-CM | POA: Diagnosis not present

## 2012-03-04 DIAGNOSIS — Z7901 Long term (current) use of anticoagulants: Secondary | ICD-10-CM

## 2012-03-04 DIAGNOSIS — K449 Diaphragmatic hernia without obstruction or gangrene: Secondary | ICD-10-CM | POA: Diagnosis not present

## 2012-03-04 DIAGNOSIS — R1310 Dysphagia, unspecified: Secondary | ICD-10-CM | POA: Diagnosis not present

## 2012-03-04 DIAGNOSIS — I482 Chronic atrial fibrillation, unspecified: Secondary | ICD-10-CM

## 2012-03-05 ENCOUNTER — Encounter (HOSPITAL_COMMUNITY): Payer: Medicare Other

## 2012-03-07 ENCOUNTER — Encounter (HOSPITAL_COMMUNITY): Payer: Medicare Other

## 2012-03-11 ENCOUNTER — Ambulatory Visit: Payer: Self-pay | Admitting: Internal Medicine

## 2012-03-11 DIAGNOSIS — I4891 Unspecified atrial fibrillation: Secondary | ICD-10-CM | POA: Diagnosis not present

## 2012-03-11 DIAGNOSIS — I482 Chronic atrial fibrillation, unspecified: Secondary | ICD-10-CM

## 2012-03-11 DIAGNOSIS — Z7901 Long term (current) use of anticoagulants: Secondary | ICD-10-CM

## 2012-03-11 LAB — POCT INR: INR: 2.4

## 2012-03-12 ENCOUNTER — Encounter (HOSPITAL_COMMUNITY): Payer: Medicare Other

## 2012-03-12 ENCOUNTER — Telehealth: Payer: Self-pay | Admitting: Internal Medicine

## 2012-03-12 NOTE — Telephone Encounter (Signed)
New PRrblem:     Called in to follow-up on a order that was faxed in requesting a monitor decive.  Please call back.

## 2012-03-12 NOTE — Telephone Encounter (Signed)
Spoke with Dois Davenport from [employer]. They have sent prescription for Dr. Graciela Husbands to sign if he would like to enroll pt in [employer] monitoring program. Will forward to Dr. Odessa Fleming nurse for follow up on this.

## 2012-03-14 ENCOUNTER — Encounter (HOSPITAL_COMMUNITY): Payer: Self-pay

## 2012-03-14 DIAGNOSIS — I472 Ventricular tachycardia, unspecified: Secondary | ICD-10-CM | POA: Insufficient documentation

## 2012-03-14 DIAGNOSIS — Z95810 Presence of automatic (implantable) cardiac defibrillator: Secondary | ICD-10-CM | POA: Insufficient documentation

## 2012-03-14 DIAGNOSIS — R0989 Other specified symptoms and signs involving the circulatory and respiratory systems: Secondary | ICD-10-CM | POA: Insufficient documentation

## 2012-03-14 DIAGNOSIS — I4891 Unspecified atrial fibrillation: Secondary | ICD-10-CM | POA: Insufficient documentation

## 2012-03-14 DIAGNOSIS — Z5189 Encounter for other specified aftercare: Secondary | ICD-10-CM | POA: Insufficient documentation

## 2012-03-14 DIAGNOSIS — Z951 Presence of aortocoronary bypass graft: Secondary | ICD-10-CM | POA: Insufficient documentation

## 2012-03-14 DIAGNOSIS — I4729 Other ventricular tachycardia: Secondary | ICD-10-CM | POA: Insufficient documentation

## 2012-03-14 DIAGNOSIS — I359 Nonrheumatic aortic valve disorder, unspecified: Secondary | ICD-10-CM | POA: Insufficient documentation

## 2012-03-14 DIAGNOSIS — I2589 Other forms of chronic ischemic heart disease: Secondary | ICD-10-CM | POA: Insufficient documentation

## 2012-03-18 ENCOUNTER — Ambulatory Visit: Payer: Self-pay | Admitting: Cardiology

## 2012-03-18 DIAGNOSIS — Z7901 Long term (current) use of anticoagulants: Secondary | ICD-10-CM

## 2012-03-18 DIAGNOSIS — I4891 Unspecified atrial fibrillation: Secondary | ICD-10-CM | POA: Diagnosis not present

## 2012-03-18 DIAGNOSIS — I482 Chronic atrial fibrillation, unspecified: Secondary | ICD-10-CM

## 2012-03-18 LAB — POCT INR: INR: 2

## 2012-03-19 ENCOUNTER — Encounter (HOSPITAL_COMMUNITY): Payer: Self-pay

## 2012-03-20 ENCOUNTER — Ambulatory Visit (INDEPENDENT_AMBULATORY_CARE_PROVIDER_SITE_OTHER): Payer: Medicare Other | Admitting: Cardiology

## 2012-03-20 ENCOUNTER — Encounter (HOSPITAL_COMMUNITY): Payer: Self-pay

## 2012-03-20 ENCOUNTER — Encounter: Payer: Self-pay | Admitting: Cardiology

## 2012-03-20 VITALS — BP 120/60 | HR 67 | Ht 75.0 in | Wt 212.0 lb

## 2012-03-20 DIAGNOSIS — I2589 Other forms of chronic ischemic heart disease: Secondary | ICD-10-CM

## 2012-03-20 DIAGNOSIS — Z954 Presence of other heart-valve replacement: Secondary | ICD-10-CM

## 2012-03-20 DIAGNOSIS — I482 Chronic atrial fibrillation, unspecified: Secondary | ICD-10-CM

## 2012-03-20 DIAGNOSIS — I4891 Unspecified atrial fibrillation: Secondary | ICD-10-CM

## 2012-03-20 DIAGNOSIS — Z7901 Long term (current) use of anticoagulants: Secondary | ICD-10-CM | POA: Diagnosis not present

## 2012-03-20 DIAGNOSIS — I255 Ischemic cardiomyopathy: Secondary | ICD-10-CM

## 2012-03-20 DIAGNOSIS — Z952 Presence of prosthetic heart valve: Secondary | ICD-10-CM

## 2012-03-20 NOTE — Progress Notes (Signed)
HPI:  The patient returns in followup.  Overall he is doing well. He's feeling somewhat better. His balance is improved slightly. He needs to have a crown done on his rear tooth.   Generally, he denies any chest pain and overall his shortness of breath is better his vision is also improved since stopping Lanoxin. He's now rate controlled on beta blockade, and he has fairly good rate control noted today on his electrocardiogram.    Current Outpatient Prescriptions  Medication Sig Dispense Refill  . alfuzosin (UROXATRAL) 10 MG 24 hr tablet Take 10 mg by mouth at bedtime.       Marland Kitchen aspirin 81 MG chewable tablet Chew 81 mg by mouth daily.       . carvedilol (COREG) 12.5 MG tablet Take 6.25 mg by mouth 2 (two) times daily with a meal.       . furosemide (LASIX) 20 MG tablet Take 20 mg by mouth as needed.       Marland Kitchen levothyroxine (SYNTHROID, LEVOTHROID) 150 MCG tablet Take 1 tablet (150 mcg total) by mouth daily.  90 tablet  1  . Multiple Vitamin (MULTIVITAMIN) tablet Take 1 tablet by mouth daily.        . nitroGLYCERIN (NITROSTAT) 0.4 MG SL tablet Place 0.4 mg under the tongue every 5 (five) minutes as needed. For chest pain      . omeprazole (PRILOSEC) 20 MG capsule Take 20 mg by mouth daily as needed. For acid reflux      . polyethylene glycol powder (GLYCOLAX/MIRALAX) powder Take 17 g by mouth daily as needed. For constipation      . RESTASIS 0.05 % ophthalmic emulsion Place 1 drop into both eyes every 12 (twelve) hours.       . rosuvastatin (CRESTOR) 5 MG tablet Take 5 mg by mouth daily.       . sildenafil (VIAGRA) 50 MG tablet Take 50 mg by mouth daily as needed.      . warfarin (COUMADIN) 5 MG tablet Take 2.5-5 mg by mouth as directed. On Monday and Thursday take 2.5 mg (1/2 tablet) All other days take 5 mg (1 tablet)        Allergies  Allergen Reactions  . Penicillins Shortness Of Breath and Rash  . Ace Inhibitors     Has not tolerated in the past due to hyperkalemia  . Antihistamines,  Diphenhydramine-Type Other (See Comments)    Inhibits urination  . Amiodarone     Past Medical History  Diagnosis Date  . Ischemic cardiomyopathy 05/2010    Has EF of 25%  . Diverticulitis     CURRENTLY CONTROLLED WITH NO EVIDENCE OF RECURRENT INFECTION  . AF (atrial fibrillation)     Has not tolerated amiodarone in the past. Amiodarone was stopped in September of 2010 due to side effects  . Chronic anticoagulation     on coumadin  . ICD (implantable cardiac defibrillator) in place   . Prostate cancer   . Osteoarthritis     RIGHT KNEE  . Aortic stenosis, severe     WITH PERCUTANEOUS AORTIC VALVE (TAVI) IN March 2012 at the [facility]  . MI (myocardial infarction) 1974, 41  . Cervical myelopathy   . Pulmonary embolism   . Hyperlipidemia   . GERD (gastroesophageal reflux disease)   . Esophageal stricture     WITH DILATATION  . NSVT (nonsustained ventricular tachycardia)   . Coronary artery disease   . Stroke   . Heart murmur   .  Pacemaker     Past Surgical History  Procedure Date  . Icd Feb 2003  . Coronary artery bypass graft 1979  . Coronary artery bypass graft 1991    REDO SURGERY  . Cardiac catheterization 2010    SEVERE LV DYSFUNCTION WITH ESTIMATED EJECTION FRACTION OF 25%  . Cholecystectomy   . Aortic valve replacement     Percutaneous AVR in March 2012 at the [name]  . Cardioversion 02/16/2011    Procedure: CARDIOVERSION;  Surgeon: Luis Abed, MD;  Location: [facility] OR;  Service: Cardiovascular;  Laterality: N/A;  . Shoulder surgery   . Pacemaker placement     Family History  Problem Relation Age of Onset  . Heart disease Mother 86  . Diabetes Mother 42  . Heart disease Father 58    History   Social History  . Marital Status: Married    Spouse Name: N/A    Number of Children: Y  . Years of Education: N/A   Occupational History  . retired. developer.     Social History Main Topics  . Smoking status: Former Smoker -- 4.0  packs/day for 15 years    Types: Cigarettes    Quit date: 06/15/1969  . Smokeless tobacco: Never Used  . Alcohol Use: No  . Drug Use: No  . Sexually Active: Yes   Other Topics Concern  . Not on file   Social History Narrative  . No narrative on file    ROS: Please see the HPI.  All other systems reviewed and negative.  PHYSICAL EXAM:  BP 120/60  Pulse 67  Ht 6\' 3"  (1.905 m)  Wt 212 lb (96.163 kg)  BMI 26.50 kg/m2  SpO2 98%  General: Well developed, well nourished, in no acute distress. Head:  Normocephalic and atraumatic. Neck: no JVD Lungs: Clear to auscultation and percussion. Heart: Valve sounds present.  Irregularly irregular rhythm.  Soft AI murmur.  Abdomen:  Normal bowel sounds; soft; non tender; no organomegaly Pulses: Pulses normal in all 4 extremities. Extremities: No clubbing or cyanosis. No edema. Neurologic: Alert and oriented x 3.  EKG:  Atrial fib.  Controlled ventricular response.  LVH with repole changes.    ASSESSMENT AND PLAN:

## 2012-03-20 NOTE — Patient Instructions (Addendum)
Return to see Dr.Stuckey in 4 to 6 weeks.  Appointment with  Dr.Klein ASAP for AICD end of life change out device.

## 2012-03-21 ENCOUNTER — Encounter (HOSPITAL_COMMUNITY): Payer: Self-pay

## 2012-03-25 ENCOUNTER — Ambulatory Visit: Payer: Self-pay | Admitting: Cardiology

## 2012-03-25 DIAGNOSIS — Z7901 Long term (current) use of anticoagulants: Secondary | ICD-10-CM

## 2012-03-25 DIAGNOSIS — I482 Chronic atrial fibrillation, unspecified: Secondary | ICD-10-CM

## 2012-03-25 NOTE — Assessment & Plan Note (Addendum)
Has done very well with TAVR, with marked improvement in his exercise tolerance. Remains in rehab.

## 2012-03-25 NOTE — Assessment & Plan Note (Signed)
Stable at present.   

## 2012-03-25 NOTE — Assessment & Plan Note (Signed)
Rate is well controlled.

## 2012-03-25 NOTE — Assessment & Plan Note (Signed)
Remains on warfarin for stroke prophylaxis.

## 2012-03-26 ENCOUNTER — Encounter (HOSPITAL_COMMUNITY)
Admission: RE | Admit: 2012-03-26 | Discharge: 2012-03-26 | Disposition: A | Discharge status: Home / Self Care | Payer: Self-pay | Source: Ambulatory Visit | Attending: Cardiology | Admitting: Cardiology

## 2012-03-27 ENCOUNTER — Encounter (HOSPITAL_COMMUNITY)
Admission: RE | Admit: 2012-03-27 | Discharge: 2012-03-27 | Disposition: A | Discharge status: Home / Self Care | Payer: Self-pay | Source: Ambulatory Visit | Attending: Cardiology | Admitting: Cardiology

## 2012-03-28 ENCOUNTER — Encounter (HOSPITAL_COMMUNITY)
Admission: RE | Admit: 2012-03-28 | Discharge: 2012-03-28 | Disposition: A | Discharge status: Home / Self Care | Payer: Self-pay | Source: Ambulatory Visit | Attending: Cardiology | Admitting: Cardiology

## 2012-04-01 DIAGNOSIS — Z125 Encounter for screening for malignant neoplasm of prostate: Secondary | ICD-10-CM | POA: Diagnosis not present

## 2012-04-01 DIAGNOSIS — E039 Hypothyroidism, unspecified: Secondary | ICD-10-CM | POA: Diagnosis not present

## 2012-04-01 DIAGNOSIS — I2510 Atherosclerotic heart disease of native coronary artery without angina pectoris: Secondary | ICD-10-CM | POA: Diagnosis not present

## 2012-04-01 DIAGNOSIS — E785 Hyperlipidemia, unspecified: Secondary | ICD-10-CM | POA: Diagnosis not present

## 2012-04-01 DIAGNOSIS — Z85828 Personal history of other malignant neoplasm of skin: Secondary | ICD-10-CM | POA: Diagnosis not present

## 2012-04-01 DIAGNOSIS — Z79899 Other long term (current) drug therapy: Secondary | ICD-10-CM | POA: Diagnosis not present

## 2012-04-01 DIAGNOSIS — L910 Hypertrophic scar: Secondary | ICD-10-CM | POA: Diagnosis not present

## 2012-04-01 DIAGNOSIS — I4891 Unspecified atrial fibrillation: Secondary | ICD-10-CM | POA: Diagnosis not present

## 2012-04-02 ENCOUNTER — Encounter (HOSPITAL_COMMUNITY)
Admission: RE | Admit: 2012-04-02 | Discharge: 2012-04-02 | Disposition: A | Discharge status: Home / Self Care | Payer: Self-pay | Source: Ambulatory Visit | Attending: Cardiology | Admitting: Cardiology

## 2012-04-03 ENCOUNTER — Encounter (HOSPITAL_COMMUNITY): Payer: Self-pay

## 2012-04-04 ENCOUNTER — Encounter (HOSPITAL_COMMUNITY): Payer: Self-pay

## 2012-04-05 DIAGNOSIS — R7309 Other abnormal glucose: Secondary | ICD-10-CM | POA: Diagnosis not present

## 2012-04-05 DIAGNOSIS — E785 Hyperlipidemia, unspecified: Secondary | ICD-10-CM | POA: Diagnosis not present

## 2012-04-05 DIAGNOSIS — Z0000 Encounter for general adult medical examination without abnormal findings: Secondary | ICD-10-CM | POA: Diagnosis not present

## 2012-04-05 DIAGNOSIS — I4891 Unspecified atrial fibrillation: Secondary | ICD-10-CM | POA: Diagnosis not present

## 2012-04-08 ENCOUNTER — Ambulatory Visit: Payer: Self-pay | Admitting: Internal Medicine

## 2012-04-08 ENCOUNTER — Other Ambulatory Visit: Payer: Self-pay

## 2012-04-08 ENCOUNTER — Encounter: Payer: Self-pay | Admitting: Internal Medicine

## 2012-04-08 ENCOUNTER — Ambulatory Visit (INDEPENDENT_AMBULATORY_CARE_PROVIDER_SITE_OTHER): Payer: Medicare Other | Admitting: Internal Medicine

## 2012-04-08 VITALS — BP 112/68 | HR 77 | Ht 75.0 in | Wt 209.0 lb

## 2012-04-08 DIAGNOSIS — I2589 Other forms of chronic ischemic heart disease: Secondary | ICD-10-CM | POA: Diagnosis not present

## 2012-04-08 DIAGNOSIS — I472 Ventricular tachycardia: Secondary | ICD-10-CM

## 2012-04-08 DIAGNOSIS — Z1212 Encounter for screening for malignant neoplasm of rectum: Secondary | ICD-10-CM | POA: Diagnosis not present

## 2012-04-08 DIAGNOSIS — Z95810 Presence of automatic (implantable) cardiac defibrillator: Secondary | ICD-10-CM | POA: Diagnosis not present

## 2012-04-08 DIAGNOSIS — Z7901 Long term (current) use of anticoagulants: Secondary | ICD-10-CM

## 2012-04-08 DIAGNOSIS — I482 Chronic atrial fibrillation, unspecified: Secondary | ICD-10-CM

## 2012-04-08 DIAGNOSIS — I4891 Unspecified atrial fibrillation: Secondary | ICD-10-CM

## 2012-04-08 DIAGNOSIS — I255 Ischemic cardiomyopathy: Secondary | ICD-10-CM

## 2012-04-08 LAB — ICD DEVICE OBSERVATION
BATTERY VOLTAGE: 2.65 V
RV LEAD IMPEDENCE ICD: 584 Ohm
TZAT-0004FASTVT: 8
TZAT-0004SLOWVT: 6
TZAT-0004SLOWVT: 8
TZAT-0005FASTVT: 88 pct
TZAT-0005SLOWVT: 84 pct
TZAT-0005SLOWVT: 91 pct
TZAT-0011SLOWVT: 10 ms
TZAT-0011SLOWVT: 10 ms
TZAT-0012FASTVT: 200 ms
TZAT-0012SLOWVT: 200 ms
TZAT-0012SLOWVT: 200 ms
TZAT-0013FASTVT: 1
TZAT-0019SLOWVT: 8 V
TZON-0003FASTVT: 250 ms
TZON-0003SLOWVT: 400 ms
TZON-0008SLOWVT: 0 ms
TZON-0011AFLUTTER: 70
TZST-0001FASTVT: 2
TZST-0001FASTVT: 5
TZST-0001SLOWVT: 4
TZST-0003FASTVT: 22 J
TZST-0003FASTVT: 35 J
TZST-0003FASTVT: 35 J
TZST-0003SLOWVT: 35 J
TZST-0003SLOWVT: 35 J
TZST-0003SLOWVT: 5 J
VENTRICULAR PACING ICD: 3.8 pct

## 2012-04-08 MED ORDER — WARFARIN SODIUM 5 MG PO TABS: ORAL_TABLET | ORAL | Status: DC

## 2012-04-08 NOTE — Patient Instructions (Addendum)
Remote monitoring is used to monitor your Pacemaker of ICD from home. This monitoring reduces the number of office visits required to check your device to one time per year. It allows [place] to keep an eye on the functioning of your device to ensure it is working properly. You are scheduled for a device check from home on July 08, 2012. You may send your transmission at any time that day. If you have a wireless device, the transmission will be sent automatically. After your physician reviews your transmission, you will receive a postcard with your next transmission date.  Your physician wants you to follow-up in: 6 months with Dr Logan Bores will receive a reminder letter in the mail two months in advance. If you don't receive a letter, please call our office to schedule the follow-up appointment.

## 2012-04-08 NOTE — Assessment & Plan Note (Signed)
Continue current medications. 

## 2012-04-08 NOTE — Progress Notes (Signed)
kf Patient Care Team: Gwen Pounds, MD as PCP - General (Internal Medicine)   HPI  [name] is a 79 y.o. male seen in followup for ischemic cardiomyopathy for which he underwent ICD implantation for primary prevention. He has had appropriate therapy for ventricular tachycardia.  He is status post bypass surgery and underwent percutaneous aortic valve replacement at [facility] He has done [employer].    He also has a history of atrial fibrillation for which amiodarone was initiated. He was intolerant of this and this has been discontinued. He has felt much better off of the digoxin and in a lower dose of carvedilol.  .   Past Medical History  Diagnosis Date  . Ischemic cardiomyopathy 05/2010    Has EF of 25%  . Diverticulitis     CURRENTLY CONTROLLED WITH NO EVIDENCE OF RECURRENT INFECTION  . AF (atrial fibrillation)     Has not tolerated amiodarone in the past. Amiodarone was stopped in September of 2010 due to side effects  . Chronic anticoagulation     on coumadin  . ICD (implantable cardiac defibrillator) in place   . Prostate cancer   . Osteoarthritis     RIGHT KNEE  . Aortic stenosis, severe     WITH PERCUTANEOUS AORTIC VALVE (TAVI) IN March 2012 at the [facility]  . MI (myocardial infarction) 1974, 47  . Cervical myelopathy   . Pulmonary embolism   . Hyperlipidemia   . GERD (gastroesophageal reflux disease)   . Esophageal stricture     WITH DILATATION  . NSVT (nonsustained ventricular tachycardia)   . Coronary artery disease   . Stroke   . Heart murmur   . Pacemaker     Past Surgical History  Procedure Date  . Icd Feb 2003  . Coronary artery bypass graft 1979  . Coronary artery bypass graft 1991    REDO SURGERY  . Cardiac catheterization 2010    SEVERE LV DYSFUNCTION WITH ESTIMATED EJECTION FRACTION OF 25%  . Cholecystectomy   . Aortic valve replacement     Percutaneous AVR in March 2012 at the [facility]  . Cardioversion  02/16/2011    Procedure: CARDIOVERSION;  Surgeon: Luis Abed, MD;  Location: [facility] OR;  Service: Cardiovascular;  Laterality: N/A;  . Shoulder surgery   . Pacemaker placement     Current Outpatient Prescriptions  Medication Sig Dispense Refill  . alfuzosin (UROXATRAL) 10 MG 24 hr tablet Take 10 mg by mouth at bedtime.       Marland Kitchen aspirin 81 MG chewable tablet Chew 81 mg by mouth daily.       . carvedilol (COREG) 12.5 MG tablet Take 6.25 mg by mouth 2 (two) times daily with a meal.       . furosemide (LASIX) 20 MG tablet Take 20 mg by mouth as needed.       Marland Kitchen levothyroxine (SYNTHROID, LEVOTHROID) 150 MCG tablet Take 1 tablet (150 mcg total) by mouth daily.  90 tablet  1  . Multiple Vitamin (MULTIVITAMIN) tablet Take 1 tablet by mouth daily.        . nitroGLYCERIN (NITROSTAT) 0.4 MG SL tablet Place 0.4 mg under the tongue every 5 (five) minutes as needed. For chest pain      . omeprazole (PRILOSEC) 20 MG capsule Take 20 mg by mouth daily as needed. For acid reflux      . polyethylene glycol powder (GLYCOLAX/MIRALAX) powder Take 17 g by mouth daily as needed. For constipation      .  RESTASIS 0.05 % ophthalmic emulsion Place 1 drop into both eyes every 12 (twelve) hours.       . rosuvastatin (CRESTOR) 5 MG tablet Take 5 mg by mouth daily.       . sildenafil (VIAGRA) 100 MG tablet Take 100 mg by mouth daily as needed.      . warfarin (COUMADIN) 5 MG tablet Take as directed by anticoagulation clinic  40 tablet  3    Allergies  Allergen Reactions  . Penicillins Shortness Of Breath and Rash  . Ace Inhibitors     Has not tolerated in the past due to hyperkalemia  . Antihistamines, Diphenhydramine-Type Other (See Comments)    Inhibits urination  . Amiodarone     Review of Systems negative except from HPI and PMH  Physical Exam BP 112/68  Pulse 77  Ht 6\' 3"  (1.905 m)  Wt 209 lb (94.802 kg)  BMI 26.12 kg/m2  SpO2 98% Well developed and well nourished in no acute distress HENT normal E  scleral and icterus clear Neck Supple JVP flat; carotids brisk and full Clear to ausculation  Regular rate and rhythm, 2/6 murmur along left sternal border Soft with active bowel sounds No clubbing cyanosis none Edema Alert and oriented, grossly normal motor and sensory function Skin Warm and Dry    Assessment and  Plan

## 2012-04-08 NOTE — Assessment & Plan Note (Signed)
No intercurrent ventricular tachycardia 

## 2012-04-08 NOTE — Assessment & Plan Note (Signed)
Adequate rate control. On warfarin. Target INR 2.5-3.5

## 2012-04-08 NOTE — Assessment & Plan Note (Signed)
The patient's device was interrogated.  The information was reviewed. No changes were made in the programming.    

## 2012-04-09 ENCOUNTER — Other Ambulatory Visit: Payer: Self-pay | Admitting: *Deleted

## 2012-04-09 ENCOUNTER — Encounter (HOSPITAL_COMMUNITY): Payer: Self-pay

## 2012-04-09 MED ORDER — WARFARIN SODIUM 5 MG PO TABS: ORAL_TABLET | ORAL | Status: DC

## 2012-04-10 ENCOUNTER — Encounter (HOSPITAL_COMMUNITY): Payer: Self-pay

## 2012-04-11 ENCOUNTER — Encounter (HOSPITAL_COMMUNITY): Payer: Self-pay

## 2012-04-15 DIAGNOSIS — R05 Cough: Secondary | ICD-10-CM | POA: Diagnosis not present

## 2012-04-15 DIAGNOSIS — J0100 Acute maxillary sinusitis, unspecified: Secondary | ICD-10-CM | POA: Diagnosis not present

## 2012-04-15 DIAGNOSIS — J309 Allergic rhinitis, unspecified: Secondary | ICD-10-CM | POA: Diagnosis not present

## 2012-04-15 DIAGNOSIS — Z7901 Long term (current) use of anticoagulants: Secondary | ICD-10-CM | POA: Diagnosis not present

## 2012-04-16 ENCOUNTER — Encounter (HOSPITAL_COMMUNITY): Payer: Medicare Other | Attending: Cardiology

## 2012-04-16 ENCOUNTER — Other Ambulatory Visit: Payer: Self-pay | Admitting: *Deleted

## 2012-04-16 DIAGNOSIS — I4891 Unspecified atrial fibrillation: Secondary | ICD-10-CM | POA: Insufficient documentation

## 2012-04-16 DIAGNOSIS — Z5189 Encounter for other specified aftercare: Secondary | ICD-10-CM | POA: Insufficient documentation

## 2012-04-16 DIAGNOSIS — I4729 Other ventricular tachycardia: Secondary | ICD-10-CM | POA: Insufficient documentation

## 2012-04-16 DIAGNOSIS — I359 Nonrheumatic aortic valve disorder, unspecified: Secondary | ICD-10-CM | POA: Insufficient documentation

## 2012-04-16 DIAGNOSIS — I2589 Other forms of chronic ischemic heart disease: Secondary | ICD-10-CM | POA: Insufficient documentation

## 2012-04-16 DIAGNOSIS — I472 Ventricular tachycardia, unspecified: Secondary | ICD-10-CM | POA: Insufficient documentation

## 2012-04-16 DIAGNOSIS — R0989 Other specified symptoms and signs involving the circulatory and respiratory systems: Secondary | ICD-10-CM | POA: Insufficient documentation

## 2012-04-16 DIAGNOSIS — Z95810 Presence of automatic (implantable) cardiac defibrillator: Secondary | ICD-10-CM | POA: Insufficient documentation

## 2012-04-16 DIAGNOSIS — Z951 Presence of aortocoronary bypass graft: Secondary | ICD-10-CM | POA: Insufficient documentation

## 2012-04-16 MED ORDER — WARFARIN SODIUM 5 MG PO TABS: ORAL_TABLET | ORAL | Status: DC

## 2012-04-17 ENCOUNTER — Encounter (HOSPITAL_COMMUNITY): Payer: Medicare Other

## 2012-04-18 ENCOUNTER — Encounter (HOSPITAL_COMMUNITY): Payer: Medicare Other

## 2012-04-22 ENCOUNTER — Ambulatory Visit: Payer: Self-pay | Admitting: Cardiology

## 2012-04-22 DIAGNOSIS — R972 Elevated prostate specific antigen [PSA]: Secondary | ICD-10-CM | POA: Diagnosis not present

## 2012-04-22 DIAGNOSIS — N320 Bladder-neck obstruction: Secondary | ICD-10-CM | POA: Diagnosis not present

## 2012-04-22 DIAGNOSIS — I482 Chronic atrial fibrillation, unspecified: Secondary | ICD-10-CM

## 2012-04-22 DIAGNOSIS — C61 Malignant neoplasm of prostate: Secondary | ICD-10-CM | POA: Diagnosis not present

## 2012-04-22 DIAGNOSIS — Z7901 Long term (current) use of anticoagulants: Secondary | ICD-10-CM

## 2012-04-22 LAB — POCT INR: INR: 1.7

## 2012-04-23 ENCOUNTER — Encounter (HOSPITAL_COMMUNITY): Payer: Medicare Other

## 2012-04-24 ENCOUNTER — Encounter (HOSPITAL_COMMUNITY): Payer: Medicare Other

## 2012-04-24 ENCOUNTER — Telehealth: Payer: Self-pay | Admitting: *Deleted

## 2012-04-24 NOTE — Telephone Encounter (Signed)
Message copied by Jeannine Kitten on Wed Apr 24, 2012  7:54 AM ------      Message from: Iona Coach      Created: Tue Apr 23, 2012  6:34 PM      Regarding: INR GOAL       Per Dr Riley Kill this pt's INR goal should be 2.5-3.0            Lauren  ------

## 2012-04-24 NOTE — Telephone Encounter (Signed)
Noted and made part of chart

## 2012-04-25 ENCOUNTER — Encounter (HOSPITAL_COMMUNITY): Payer: Medicare Other

## 2012-04-29 ENCOUNTER — Ambulatory Visit (INDEPENDENT_AMBULATORY_CARE_PROVIDER_SITE_OTHER): Payer: Medicare Other | Admitting: Cardiology

## 2012-04-29 ENCOUNTER — Ambulatory Visit: Payer: Self-pay | Admitting: Cardiology

## 2012-04-29 ENCOUNTER — Encounter: Payer: Self-pay | Admitting: Cardiology

## 2012-04-29 VITALS — BP 100/60 | HR 65 | Ht 75.5 in | Wt 211.0 lb

## 2012-04-29 DIAGNOSIS — E785 Hyperlipidemia, unspecified: Secondary | ICD-10-CM

## 2012-04-29 DIAGNOSIS — I482 Chronic atrial fibrillation, unspecified: Secondary | ICD-10-CM

## 2012-04-29 DIAGNOSIS — Z954 Presence of other heart-valve replacement: Secondary | ICD-10-CM

## 2012-04-29 DIAGNOSIS — I255 Ischemic cardiomyopathy: Secondary | ICD-10-CM

## 2012-04-29 DIAGNOSIS — Z95810 Presence of automatic (implantable) cardiac defibrillator: Secondary | ICD-10-CM

## 2012-04-29 DIAGNOSIS — I4891 Unspecified atrial fibrillation: Secondary | ICD-10-CM | POA: Diagnosis not present

## 2012-04-29 DIAGNOSIS — Z7901 Long term (current) use of anticoagulants: Secondary | ICD-10-CM | POA: Diagnosis not present

## 2012-04-29 DIAGNOSIS — I2589 Other forms of chronic ischemic heart disease: Secondary | ICD-10-CM

## 2012-04-29 DIAGNOSIS — Z952 Presence of prosthetic heart valve: Secondary | ICD-10-CM

## 2012-04-29 LAB — BASIC METABOLIC PANEL
BUN: 17 mg/dL (ref 6–23)
CO2: 29 mEq/L (ref 19–32)
Calcium: 8.7 mg/dL (ref 8.4–10.5)
Chloride: 99 mEq/L (ref 96–112)
Creatinine, Ser: 1.1 mg/dL (ref 0.4–1.5)

## 2012-04-29 LAB — CBC WITH DIFFERENTIAL/PLATELET
Basophils Absolute: 0 10*3/uL (ref 0.0–0.1)
Eosinophils Absolute: 0.2 10*3/uL (ref 0.0–0.7)
Lymphocytes Relative: 14.3 % (ref 12.0–46.0)
MCHC: 32.8 g/dL (ref 30.0–36.0)
MCV: 92.7 fl (ref 78.0–100.0)
Monocytes Absolute: 0.6 10*3/uL (ref 0.1–1.0)
Neutrophils Relative %: 73.5 % (ref 43.0–77.0)
Platelets: 186 10*3/uL (ref 150.0–400.0)
RBC: 4.59 Mil/uL (ref 4.22–5.81)

## 2012-04-29 LAB — POCT INR: INR: 2.6

## 2012-04-29 NOTE — Patient Instructions (Signed)
Your physician recommends that you have lab work today: BMP and CBC  Your physician recommends that you schedule a follow-up appointment in: 3 MONTHS with Dr Excell Seltzer (previous pt of Dr Riley Kill)  Your physician recommends that you continue on your current medications as directed. Please refer to the Current Medication list given to you today.

## 2012-04-29 NOTE — Assessment & Plan Note (Signed)
Valve hemodynamics seem stable.  Followed in [place] at the [facility].  Has paravalvular leak, but stable.  Continue regimen.

## 2012-04-29 NOTE — Assessment & Plan Note (Signed)
Rate is controlled.  INR goal really between 2.5-3.0.  No current symptoms.  Continue same regimen.

## 2012-04-29 NOTE — Assessment & Plan Note (Signed)
Remains on very low dose Crestor.  Continue the same.

## 2012-04-29 NOTE — Progress Notes (Signed)
HPI:  This nice gentleman seen today in a followup visit. From a cardiac standpoint, he is been relatively stable. He continues with rehabilitation, and he is getting ready to go to [place] for a period of time. He remains on anticoagulation therapy with persistent atrial fibrillation. He's also had a largely resolving neurologic ischemic deficit, so continued medical therapy has been recommended. He denies any active chest pain. He's been taking furosemide on as needed basis.  Current Outpatient Prescriptions  Medication Sig Dispense Refill  . alfuzosin (UROXATRAL) 10 MG 24 hr tablet Take 10 mg by mouth at bedtime.       Marland Kitchen aspirin 81 MG chewable tablet Chew 81 mg by mouth daily.       . carvedilol (COREG) 12.5 MG tablet Take 6.25 mg by mouth 2 (two) times daily with a meal.       . furosemide (LASIX) 20 MG tablet Take 20 mg by mouth as needed.       Marland Kitchen levothyroxine (SYNTHROID, LEVOTHROID) 150 MCG tablet Take 1 tablet (150 mcg total) by mouth daily.  90 tablet  1  . Multiple Vitamin (MULTIVITAMIN) tablet Take 1 tablet by mouth daily.        . nitroGLYCERIN (NITROSTAT) 0.4 MG SL tablet Place 0.4 mg under the tongue every 5 (five) minutes as needed. For chest pain      . omeprazole (PRILOSEC) 20 MG capsule Take 20 mg by mouth daily as needed. For acid reflux      . polyethylene glycol powder (GLYCOLAX/MIRALAX) powder Take 17 g by mouth daily as needed. For constipation      . RESTASIS 0.05 % ophthalmic emulsion Place 1 drop into both eyes every 12 (twelve) hours.       . rosuvastatin (CRESTOR) 5 MG tablet Take 5 mg by mouth daily.       . sildenafil (VIAGRA) 100 MG tablet Take 100 mg by mouth daily as needed.      . warfarin (COUMADIN) 5 MG tablet Take as directed by anticoagulation clinic  40 tablet  3   No current facility-administered medications for this visit.    Allergies  Allergen Reactions  . Penicillins Shortness Of Breath and Rash  . Ace Inhibitors     Has not tolerated in the  past due to hyperkalemia  . Antihistamines, Diphenhydramine-Type Other (See Comments)    Inhibits urination  . Amiodarone     Past Medical History  Diagnosis Date  . Ischemic cardiomyopathy 05/2010    Has EF of 25%  . Diverticulitis     CURRENTLY CONTROLLED WITH NO EVIDENCE OF RECURRENT INFECTION  . AF (atrial fibrillation)     Has not tolerated amiodarone in the past. Amiodarone was stopped in September of 2010 due to side effects  . Chronic anticoagulation     on coumadin  . ICD (implantable cardiac defibrillator) in place   . Prostate cancer   . Osteoarthritis     RIGHT KNEE  . Aortic stenosis, severe     WITH PERCUTANEOUS AORTIC VALVE (TAVI) IN March 2012 at the [facility]  . MI (myocardial infarction) 1974, 3  . Cervical myelopathy   . Pulmonary embolism   . Hyperlipidemia   . GERD (gastroesophageal reflux disease)   . Esophageal stricture     WITH DILATATION  . NSVT (nonsustained ventricular tachycardia)   . Coronary artery disease   . Stroke   . Heart murmur   . Pacemaker     Past Surgical  History  Procedure Laterality Date  . Icd  Feb 2003  . Coronary artery bypass graft  1979  . Coronary artery bypass graft  1991    REDO SURGERY  . Cardiac catheterization  2010    SEVERE LV DYSFUNCTION WITH ESTIMATED EJECTION FRACTION OF 25%  . Cholecystectomy    . Aortic valve replacement      Percutaneous AVR in March 2012 at the [facility]  . Cardioversion  02/16/2011    Procedure: CARDIOVERSION;  Surgeon: Luis Abed, MD;  Location: [facility] OR;  Service: Cardiovascular;  Laterality: N/A;  . Shoulder surgery    . Pacemaker placement      Family History  Problem Relation Age of Onset  . Heart disease Mother 92  . Diabetes Mother 8  . Heart disease Father 27    History   Social History  . Marital Status: Married    Spouse Name: N/A    Number of Children: Y  . Years of Education: N/A   Occupational History  . retired. developer.     Social  History Main Topics  . Smoking status: Former Smoker -- 4.00 packs/day for 15 years    Types: Cigarettes    Quit date: 06/15/1969  . Smokeless tobacco: Never Used  . Alcohol Use: No  . Drug Use: No  . Sexually Active: Yes   Other Topics Concern  . Not on file   Social History Narrative  . No narrative on file    ROS: Please see the HPI.  All other systems reviewed and negative.  PHYSICAL EXAM:  BP 100/60  Pulse 65  Ht 6' 3.5" (1.918 m)  Wt 211 lb (95.709 kg)  BMI 26.02 kg/m2  SpO2 98%  General: Well developed, well nourished, in no acute distress. Head:  Normocephalic and atraumatic. Neck: no JVD Lungs: Clear to auscultation and percussion. Heart: irregularly irregular rhythm.  Soft SEM 1/6 with early diastolic blow  (Known paravalvular leak) Pulses: Pulses normal in all 4 extremities. Extremities: No clubbing or cyanosis. No edema. Neurologic: Alert and oriented x 3.  EKG:  Atrial fib, controlled ventricular response.  Anterior MI, old.  T inversion laterally.  No change from prior tracing.    ASSESSMENT AND PLAN:

## 2012-04-30 ENCOUNTER — Encounter (HOSPITAL_COMMUNITY): Payer: Medicare Other

## 2012-05-01 ENCOUNTER — Encounter (HOSPITAL_COMMUNITY): Payer: Medicare Other

## 2012-05-02 ENCOUNTER — Telehealth: Payer: Self-pay | Admitting: Cardiology

## 2012-05-02 ENCOUNTER — Encounter (HOSPITAL_COMMUNITY): Payer: Medicare Other

## 2012-05-02 NOTE — Telephone Encounter (Signed)
New problem    Patient calling back stating returning triage call - Debbie

## 2012-05-02 NOTE — Telephone Encounter (Signed)
Lab results given to pt.

## 2012-05-02 NOTE — Telephone Encounter (Signed)
Pt rtn your call/lg °

## 2012-05-06 ENCOUNTER — Ambulatory Visit: Payer: Self-pay | Admitting: Cardiovascular Disease

## 2012-05-06 ENCOUNTER — Telehealth: Payer: Self-pay | Admitting: Pharmacist

## 2012-05-06 DIAGNOSIS — Z880 Allergy status to penicillin: Secondary | ICD-10-CM | POA: Diagnosis not present

## 2012-05-06 DIAGNOSIS — Z8546 Personal history of malignant neoplasm of prostate: Secondary | ICD-10-CM | POA: Diagnosis not present

## 2012-05-06 DIAGNOSIS — Z7901 Long term (current) use of anticoagulants: Secondary | ICD-10-CM

## 2012-05-06 DIAGNOSIS — D689 Coagulation defect, unspecified: Secondary | ICD-10-CM | POA: Diagnosis not present

## 2012-05-06 DIAGNOSIS — Z8673 Personal history of transient ischemic attack (TIA), and cerebral infarction without residual deficits: Secondary | ICD-10-CM | POA: Diagnosis not present

## 2012-05-06 DIAGNOSIS — R197 Diarrhea, unspecified: Secondary | ICD-10-CM | POA: Diagnosis not present

## 2012-05-06 DIAGNOSIS — Z9889 Other specified postprocedural states: Secondary | ICD-10-CM | POA: Diagnosis not present

## 2012-05-06 DIAGNOSIS — I482 Chronic atrial fibrillation, unspecified: Secondary | ICD-10-CM

## 2012-05-06 DIAGNOSIS — E039 Hypothyroidism, unspecified: Secondary | ICD-10-CM | POA: Diagnosis not present

## 2012-05-06 DIAGNOSIS — I252 Old myocardial infarction: Secondary | ICD-10-CM | POA: Diagnosis not present

## 2012-05-06 DIAGNOSIS — Z79899 Other long term (current) drug therapy: Secondary | ICD-10-CM | POA: Diagnosis not present

## 2012-05-06 DIAGNOSIS — I4891 Unspecified atrial fibrillation: Secondary | ICD-10-CM | POA: Diagnosis not present

## 2012-05-06 DIAGNOSIS — K921 Melena: Secondary | ICD-10-CM | POA: Diagnosis not present

## 2012-05-06 DIAGNOSIS — K625 Hemorrhage of anus and rectum: Secondary | ICD-10-CM | POA: Diagnosis not present

## 2012-05-06 DIAGNOSIS — Z87891 Personal history of nicotine dependence: Secondary | ICD-10-CM | POA: Diagnosis not present

## 2012-05-06 DIAGNOSIS — R58 Hemorrhage, not elsewhere classified: Secondary | ICD-10-CM | POA: Diagnosis not present

## 2012-05-06 DIAGNOSIS — Z888 Allergy status to other drugs, medicaments and biological substances status: Secondary | ICD-10-CM | POA: Diagnosis not present

## 2012-05-06 LAB — POCT INR: INR: 3.4

## 2012-05-06 NOTE — Telephone Encounter (Signed)
Patient called to report INR (self-tester). See anticoag encounter note for further details/instructions.

## 2012-05-07 ENCOUNTER — Encounter (HOSPITAL_COMMUNITY): Payer: Medicare Other

## 2012-05-08 ENCOUNTER — Encounter (HOSPITAL_COMMUNITY): Payer: Medicare Other

## 2012-05-09 ENCOUNTER — Encounter (HOSPITAL_COMMUNITY): Payer: Medicare Other

## 2012-05-10 NOTE — Assessment & Plan Note (Signed)
Overall functional status has improved.

## 2012-05-10 NOTE — Assessment & Plan Note (Signed)
Followed by Dr. Klein 

## 2012-05-13 ENCOUNTER — Ambulatory Visit: Payer: Self-pay | Admitting: Cardiovascular Disease

## 2012-05-13 DIAGNOSIS — I482 Chronic atrial fibrillation, unspecified: Secondary | ICD-10-CM

## 2012-05-13 LAB — POCT INR: INR: 2.4

## 2012-05-14 ENCOUNTER — Encounter (HOSPITAL_COMMUNITY): Payer: Medicare Other | Attending: Cardiology

## 2012-05-14 DIAGNOSIS — Z5189 Encounter for other specified aftercare: Secondary | ICD-10-CM | POA: Insufficient documentation

## 2012-05-14 DIAGNOSIS — R0989 Other specified symptoms and signs involving the circulatory and respiratory systems: Secondary | ICD-10-CM | POA: Insufficient documentation

## 2012-05-14 DIAGNOSIS — Z951 Presence of aortocoronary bypass graft: Secondary | ICD-10-CM | POA: Insufficient documentation

## 2012-05-14 DIAGNOSIS — I2589 Other forms of chronic ischemic heart disease: Secondary | ICD-10-CM | POA: Insufficient documentation

## 2012-05-14 DIAGNOSIS — I4891 Unspecified atrial fibrillation: Secondary | ICD-10-CM | POA: Insufficient documentation

## 2012-05-14 DIAGNOSIS — I472 Ventricular tachycardia, unspecified: Secondary | ICD-10-CM | POA: Insufficient documentation

## 2012-05-14 DIAGNOSIS — I4729 Other ventricular tachycardia: Secondary | ICD-10-CM | POA: Insufficient documentation

## 2012-05-14 DIAGNOSIS — I359 Nonrheumatic aortic valve disorder, unspecified: Secondary | ICD-10-CM | POA: Insufficient documentation

## 2012-05-14 DIAGNOSIS — Z95810 Presence of automatic (implantable) cardiac defibrillator: Secondary | ICD-10-CM | POA: Insufficient documentation

## 2012-05-15 ENCOUNTER — Encounter (HOSPITAL_COMMUNITY): Payer: Medicare Other

## 2012-05-16 ENCOUNTER — Encounter (HOSPITAL_COMMUNITY): Payer: Medicare Other

## 2012-05-16 DIAGNOSIS — R9431 Abnormal electrocardiogram [ECG] [EKG]: Secondary | ICD-10-CM | POA: Diagnosis not present

## 2012-05-16 DIAGNOSIS — Z951 Presence of aortocoronary bypass graft: Secondary | ICD-10-CM | POA: Diagnosis not present

## 2012-05-16 DIAGNOSIS — I2510 Atherosclerotic heart disease of native coronary artery without angina pectoris: Secondary | ICD-10-CM | POA: Diagnosis not present

## 2012-05-16 DIAGNOSIS — M545 Low back pain, unspecified: Secondary | ICD-10-CM | POA: Diagnosis not present

## 2012-05-16 DIAGNOSIS — I4891 Unspecified atrial fibrillation: Secondary | ICD-10-CM | POA: Diagnosis not present

## 2012-05-16 DIAGNOSIS — M4800 Spinal stenosis, site unspecified: Secondary | ICD-10-CM | POA: Diagnosis not present

## 2012-05-16 DIAGNOSIS — I5022 Chronic systolic (congestive) heart failure: Secondary | ICD-10-CM | POA: Diagnosis not present

## 2012-05-16 DIAGNOSIS — S32009A Unspecified fracture of unspecified lumbar vertebra, initial encounter for closed fracture: Secondary | ICD-10-CM | POA: Diagnosis not present

## 2012-05-16 DIAGNOSIS — I472 Ventricular tachycardia, unspecified: Secondary | ICD-10-CM | POA: Diagnosis present

## 2012-05-16 DIAGNOSIS — E039 Hypothyroidism, unspecified: Secondary | ICD-10-CM | POA: Diagnosis not present

## 2012-05-16 DIAGNOSIS — M48061 Spinal stenosis, lumbar region without neurogenic claudication: Secondary | ICD-10-CM | POA: Diagnosis not present

## 2012-05-16 DIAGNOSIS — Z86711 Personal history of pulmonary embolism: Secondary | ICD-10-CM | POA: Diagnosis not present

## 2012-05-16 DIAGNOSIS — I4729 Other ventricular tachycardia: Secondary | ICD-10-CM | POA: Diagnosis not present

## 2012-05-16 DIAGNOSIS — D649 Anemia, unspecified: Secondary | ICD-10-CM | POA: Diagnosis not present

## 2012-05-16 DIAGNOSIS — M549 Dorsalgia, unspecified: Secondary | ICD-10-CM | POA: Diagnosis not present

## 2012-05-16 DIAGNOSIS — I252 Old myocardial infarction: Secondary | ICD-10-CM | POA: Diagnosis not present

## 2012-05-16 DIAGNOSIS — Z95810 Presence of automatic (implantable) cardiac defibrillator: Secondary | ICD-10-CM | POA: Diagnosis not present

## 2012-05-16 DIAGNOSIS — M25559 Pain in unspecified hip: Secondary | ICD-10-CM | POA: Diagnosis not present

## 2012-05-16 DIAGNOSIS — Z954 Presence of other heart-valve replacement: Secondary | ICD-10-CM | POA: Diagnosis not present

## 2012-05-16 DIAGNOSIS — Z923 Personal history of irradiation: Secondary | ICD-10-CM | POA: Diagnosis not present

## 2012-05-16 DIAGNOSIS — Z8546 Personal history of malignant neoplasm of prostate: Secondary | ICD-10-CM | POA: Diagnosis not present

## 2012-05-21 ENCOUNTER — Inpatient Hospital Stay (HOSPITAL_COMMUNITY)
Admission: AD | Admit: 2012-05-21 | Discharge: 2012-05-24 | DRG: 543 | Disposition: A | Discharge status: Home Health | Payer: Medicare Other | Source: Other Acute Inpatient Hospital | Attending: Internal Medicine | Admitting: Internal Medicine

## 2012-05-21 ENCOUNTER — Encounter (HOSPITAL_COMMUNITY): Payer: Medicare Other

## 2012-05-21 DIAGNOSIS — Z95810 Presence of automatic (implantable) cardiac defibrillator: Secondary | ICD-10-CM | POA: Diagnosis not present

## 2012-05-21 DIAGNOSIS — M8008XA Age-related osteoporosis with current pathological fracture, vertebra(e), initial encounter for fracture: Secondary | ICD-10-CM

## 2012-05-21 DIAGNOSIS — I4891 Unspecified atrial fibrillation: Secondary | ICD-10-CM | POA: Diagnosis present

## 2012-05-21 DIAGNOSIS — Z859 Personal history of malignant neoplasm, unspecified: Secondary | ICD-10-CM

## 2012-05-21 DIAGNOSIS — D649 Anemia, unspecified: Secondary | ICD-10-CM | POA: Diagnosis not present

## 2012-05-21 DIAGNOSIS — I2699 Other pulmonary embolism without acute cor pulmonale: Secondary | ICD-10-CM | POA: Diagnosis not present

## 2012-05-21 DIAGNOSIS — K5730 Diverticulosis of large intestine without perforation or abscess without bleeding: Secondary | ICD-10-CM | POA: Diagnosis present

## 2012-05-21 DIAGNOSIS — I472 Ventricular tachycardia, unspecified: Secondary | ICD-10-CM | POA: Diagnosis present

## 2012-05-21 DIAGNOSIS — Z86711 Personal history of pulmonary embolism: Secondary | ICD-10-CM | POA: Diagnosis not present

## 2012-05-21 DIAGNOSIS — I2510 Atherosclerotic heart disease of native coronary artery without angina pectoris: Secondary | ICD-10-CM | POA: Diagnosis present

## 2012-05-21 DIAGNOSIS — E785 Hyperlipidemia, unspecified: Secondary | ICD-10-CM | POA: Diagnosis present

## 2012-05-21 DIAGNOSIS — M8448XA Pathological fracture, other site, initial encounter for fracture: Secondary | ICD-10-CM | POA: Diagnosis present

## 2012-05-21 DIAGNOSIS — Z954 Presence of other heart-valve replacement: Secondary | ICD-10-CM | POA: Diagnosis not present

## 2012-05-21 DIAGNOSIS — I482 Chronic atrial fibrillation, unspecified: Secondary | ICD-10-CM

## 2012-05-21 DIAGNOSIS — I2589 Other forms of chronic ischemic heart disease: Secondary | ICD-10-CM | POA: Diagnosis present

## 2012-05-21 DIAGNOSIS — M1990 Unspecified osteoarthritis, unspecified site: Secondary | ICD-10-CM | POA: Diagnosis present

## 2012-05-21 DIAGNOSIS — W19XXXA Unspecified fall, initial encounter: Secondary | ICD-10-CM | POA: Diagnosis present

## 2012-05-21 DIAGNOSIS — I4729 Other ventricular tachycardia: Secondary | ICD-10-CM | POA: Diagnosis present

## 2012-05-21 DIAGNOSIS — I252 Old myocardial infarction: Secondary | ICD-10-CM | POA: Diagnosis not present

## 2012-05-21 DIAGNOSIS — Z7901 Long term (current) use of anticoagulants: Secondary | ICD-10-CM | POA: Diagnosis not present

## 2012-05-21 DIAGNOSIS — R609 Edema, unspecified: Secondary | ICD-10-CM | POA: Diagnosis not present

## 2012-05-21 DIAGNOSIS — K219 Gastro-esophageal reflux disease without esophagitis: Secondary | ICD-10-CM | POA: Diagnosis present

## 2012-05-21 DIAGNOSIS — E039 Hypothyroidism, unspecified: Secondary | ICD-10-CM | POA: Diagnosis present

## 2012-05-21 DIAGNOSIS — M25559 Pain in unspecified hip: Secondary | ICD-10-CM | POA: Diagnosis not present

## 2012-05-21 DIAGNOSIS — Z951 Presence of aortocoronary bypass graft: Secondary | ICD-10-CM

## 2012-05-21 DIAGNOSIS — M545 Low back pain, unspecified: Secondary | ICD-10-CM | POA: Diagnosis not present

## 2012-05-21 DIAGNOSIS — K5900 Constipation, unspecified: Secondary | ICD-10-CM | POA: Diagnosis present

## 2012-05-21 DIAGNOSIS — Z8673 Personal history of transient ischemic attack (TIA), and cerebral infarction without residual deficits: Secondary | ICD-10-CM

## 2012-05-21 DIAGNOSIS — M810 Age-related osteoporosis without current pathological fracture: Secondary | ICD-10-CM | POA: Diagnosis present

## 2012-05-21 DIAGNOSIS — M48061 Spinal stenosis, lumbar region without neurogenic claudication: Secondary | ICD-10-CM | POA: Diagnosis present

## 2012-05-21 DIAGNOSIS — S32009A Unspecified fracture of unspecified lumbar vertebra, initial encounter for closed fracture: Secondary | ICD-10-CM | POA: Diagnosis not present

## 2012-05-21 DIAGNOSIS — M4800 Spinal stenosis, site unspecified: Secondary | ICD-10-CM | POA: Diagnosis not present

## 2012-05-21 DIAGNOSIS — Y998 Other external cause status: Secondary | ICD-10-CM

## 2012-05-21 MED ORDER — HYDROMORPHONE HCL PF 1 MG/ML IJ SOLN: 1.0000 mg | INTRAMUSCULAR | Status: DC | PRN

## 2012-05-21 MED ORDER — ONDANSETRON HCL 4 MG PO TABS: 4.0000 mg | ORAL_TABLET | Freq: Four times a day (QID) | ORAL | Status: DC | PRN

## 2012-05-21 MED ORDER — CYCLOSPORINE 0.05 % OP EMUL
1.0000 [drp] | Freq: Two times a day (BID) | OPHTHALMIC | Status: DC
  Administered 2012-05-21 – 2012-05-24 (×6): 1 [drp] via OPHTHALMIC
  Filled 2012-05-21 (×9): qty 1

## 2012-05-21 MED ORDER — POLYETHYLENE GLYCOL 3350 17 GM/SCOOP PO POWD
17.0000 g | Freq: Every day | ORAL | Status: DC | PRN
  Filled 2012-05-21: qty 255

## 2012-05-21 MED ORDER — SODIUM CHLORIDE 0.9 % IJ SOLN
3.0000 mL | Freq: Two times a day (BID) | INTRAMUSCULAR | Status: DC
  Administered 2012-05-21 – 2012-05-23 (×3): 3 mL via INTRAVENOUS

## 2012-05-21 MED ORDER — DOCUSATE SODIUM 100 MG PO CAPS: 100.0000 mg | ORAL_CAPSULE | Freq: Two times a day (BID) | ORAL | Status: DC

## 2012-05-21 MED ORDER — HYDROCODONE-ACETAMINOPHEN 5-325 MG PO TABS
1.0000 | ORAL_TABLET | Freq: Four times a day (QID) | ORAL | Status: DC | PRN
  Administered 2012-05-21 – 2012-05-24 (×7): 2 via ORAL
  Filled 2012-05-21 (×8): qty 2

## 2012-05-21 MED ORDER — ALFUZOSIN HCL ER 10 MG PO TB24
10.0000 mg | ORAL_TABLET | Freq: Every day | ORAL | Status: DC
  Administered 2012-05-21 – 2012-05-23 (×3): 10 mg via ORAL
  Filled 2012-05-21 (×5): qty 1

## 2012-05-21 MED ORDER — FUROSEMIDE 20 MG PO TABS
20.0000 mg | ORAL_TABLET | Freq: Once | ORAL | Status: AC
  Administered 2012-05-21: 20 mg via ORAL
  Filled 2012-05-21 (×2): qty 1

## 2012-05-21 MED ORDER — NITROGLYCERIN 0.4 MG SL SUBL: 0.4000 mg | SUBLINGUAL_TABLET | SUBLINGUAL | Status: DC | PRN

## 2012-05-21 MED ORDER — ATORVASTATIN CALCIUM 10 MG PO TABS
10.0000 mg | ORAL_TABLET | Freq: Every day | ORAL | Status: DC
  Filled 2012-05-21: qty 1

## 2012-05-21 MED ORDER — LEVOTHYROXINE SODIUM 150 MCG PO TABS
150.0000 ug | ORAL_TABLET | Freq: Every day | ORAL | Status: DC
  Administered 2012-05-22 – 2012-05-24 (×3): 150 ug via ORAL
  Filled 2012-05-21 (×5): qty 1

## 2012-05-21 MED ORDER — ONDANSETRON HCL 4 MG/2ML IJ SOLN
4.0000 mg | Freq: Four times a day (QID) | INTRAMUSCULAR | Status: DC | PRN
  Administered 2012-05-21: 4 mg via INTRAVENOUS
  Filled 2012-05-21: qty 2

## 2012-05-21 MED ORDER — ACETAMINOPHEN 650 MG RE SUPP: 650.0000 mg | Freq: Four times a day (QID) | RECTAL | Status: DC | PRN

## 2012-05-21 MED ORDER — CARVEDILOL 6.25 MG PO TABS
6.2500 mg | ORAL_TABLET | Freq: Two times a day (BID) | ORAL | Status: DC
  Administered 2012-05-23 (×3): 6.25 mg via ORAL
  Filled 2012-05-21 (×7): qty 1

## 2012-05-21 MED ORDER — ACETAMINOPHEN 325 MG PO TABS: 650.0000 mg | ORAL_TABLET | Freq: Four times a day (QID) | ORAL | Status: DC | PRN

## 2012-05-21 MED ORDER — ADULT MULTIVITAMIN W/MINERALS CH
1.0000 | ORAL_TABLET | Freq: Every day | ORAL | Status: DC
  Administered 2012-05-21 – 2012-05-24 (×4): 1 via ORAL
  Filled 2012-05-21 (×5): qty 1

## 2012-05-21 MED ORDER — ALUM & MAG HYDROXIDE-SIMETH 200-200-20 MG/5ML PO SUSP: 30.0000 mL | Freq: Four times a day (QID) | ORAL | Status: DC | PRN

## 2012-05-21 MED ORDER — PANTOPRAZOLE SODIUM 40 MG PO TBEC
40.0000 mg | DELAYED_RELEASE_TABLET | Freq: Every day | ORAL | Status: DC
  Administered 2012-05-21 – 2012-05-24 (×4): 40 mg via ORAL
  Filled 2012-05-21 (×4): qty 1

## 2012-05-21 MED ORDER — ASPIRIN 81 MG PO CHEW
81.0000 mg | CHEWABLE_TABLET | Freq: Every day | ORAL | Status: DC
  Administered 2012-05-21 – 2012-05-24 (×4): 81 mg via ORAL
  Filled 2012-05-21 (×4): qty 1

## 2012-05-21 MED ORDER — POLYETHYLENE GLYCOL 3350 17 G PO PACK
17.0000 g | PACK | Freq: Every day | ORAL | Status: DC | PRN
  Filled 2012-05-21: qty 1

## 2012-05-21 NOTE — H&P (Signed)
[name] is an 79 y.o. male.   Chief Complaint: vertebral fractures HPI:  [name] is a pleasant gentleman with past history as below.  He was in [place].  He was in the bedroom and fell.  He fell on his bottom . The wind was knocked out of him.  He had immediate pain in back.  Initially the legs were numb and then the feeling came back.  He was admitted to the hospital in [place].  While there he had an echo showing EF of25-30%.  He had some runs of svt.   Cardiology saw him and no new recs were made.  He worked with PT and it is felt he would need further rehab.  He wanted transfer to [facility]. And today he came via charter plane. He is to be admitted.  He did have a ct scan (no mri given his icd).   Lying still only mild pain. Bruise of his right hip hurts as much as his low back now. He was able to get in a  Position on the plane with his knees bent up some in which the pain was relieved for a time. He has walked some with a walker.  His CT scan also showed some spinal stenosis at L3-4 and L4-5.  Yesterday he had some red blood from a hemorrhoid.  Some constipation while admitted.  Reportedly he has two compression fractures, one at L1 to L4.  Past Medical History  Diagnosis Date  . Ischemic cardiomyopathy 05/2010    Has EF of 25%  . Diverticulitis     CURRENTLY CONTROLLED WITH NO EVIDENCE OF RECURRENT INFECTION  . AF (atrial fibrillation)     Has not tolerated amiodarone in the past. Amiodarone was stopped in September of 2010 due to side effects  . Chronic anticoagulation     on coumadin  . ICD (implantable cardiac defibrillator) in place   . Prostate cancer treated with radiation treatments 2006   . Osteoarthritis     RIGHT KNEE  . Aortic stenosis, severe     WITH PERCUTANEOUS AORTIC VALVE (TAVI) IN March 2012 at the [facility]  . MI (myocardial infarction) 1974, 65  . Cervical myelopathy   . Pulmonary embolism   . Hyperlipidemia   . GERD (gastroesophageal reflux disease)    . Esophageal stricture     WITH DILATATION  . NSVT (nonsustained ventricular tachycardia)   . Coronary artery disease   . Stroke   . Heart murmur   . Pacemaker Hypothyroidism TIA 9/13 Erectile Disfunction     Past Surgical History  Procedure Laterality Date  . Icd  Feb 2003  . Coronary artery bypass graft  1979  . Coronary artery bypass graft  1991    REDO SURGERY  . Cardiac catheterization  2010    SEVERE LV DYSFUNCTION WITH ESTIMATED EJECTION FRACTION OF 25%  . Cholecystectomy    . Aortic valve replacement      Percutaneous AVR in March 2012 at the [facility]  . Cardioversion  02/16/2011    Procedure: CARDIOVERSION;  Surgeon: Luis Abed, MD;  Location: [facility] OR;  Service: Cardiovascular;  Laterality: N/A;  . Shoulder surgery    . Pacemaker placement      Family History  Problem Relation Age of Onset  . Heart disease Mother 27  . Diabetes Mother 59  . Heart disease Father 53   Social History:  reports that he quit smoking about 42 years ago. His smoking use  included Cigarettes. He has a 60 pack-year smoking history. He has never used smokeless tobacco. He reports that he does not drink alcohol or use illicit drugs.  Wife is [name], married 45 years ago or so. No children with [name]. He has one daughter [name].  GC 10 in all with him and [name].  6 GGC in all.  Allergies:  Allergies  Allergen Reactions  . Penicillins Shortness Of Breath and Rash  . Ace Inhibitors     Has not tolerated in the past due to hyperkalemia  . Antihistamines, Diphenhydramine-Type Other (See Comments)    Inhibits urination  . Amiodarone     Medications Prior to Admission  Medication Sig Dispense Refill  . alfuzosin (UROXATRAL) 10 MG 24 hr tablet Take 10 mg by mouth at bedtime.       Marland Kitchen aspirin 81 MG chewable tablet Chew 81 mg by mouth daily.       . carvedilol (COREG) 12.5 MG tablet Take 6.25 mg by mouth 2 (two) times daily with a meal.       . furosemide (LASIX) 20 MG tablet Take 20 mg by  mouth as needed.       Marland Kitchen levothyroxine (SYNTHROID, LEVOTHROID) 150 MCG tablet Take 1 tablet (150 mcg total) by mouth daily.  90 tablet  1  . Multiple Vitamin (MULTIVITAMIN) tablet Take 1 tablet by mouth daily.        . nitroGLYCERIN (NITROSTAT) 0.4 MG SL tablet Place 0.4 mg under the tongue every 5 (five) minutes as needed. For chest pain      . omeprazole (PRILOSEC) 20 MG capsule Take 20 mg by mouth daily as needed. For acid reflux      . polyethylene glycol powder (GLYCOLAX/MIRALAX) powder Take 17 g by mouth daily as needed. For constipation      . RESTASIS 0.05 % ophthalmic emulsion Place 1 drop into both eyes every 12 (twelve) hours.       . rosuvastatin (CRESTOR) 5 MG tablet Take 5 mg by mouth daily.       . sildenafil (VIAGRA) 100 MG tablet Take 100 mg by mouth daily as needed.      . warfarin (COUMADIN) 5 MG tablet Take as directed by anticoagulation clinic  40 tablet  3    No results found for this or any previous visit (from the past 48 hour(s)). No results found.  ROS:as per hpi.  See vitals in chart summary.  age appropriate man, lying in bed supine, no acute distress but slight increased work of breathing. no sig. jvd.  lungs are cta bilat. no w/r/r.  heart is irreg irreg with 2/6 sem rsb, no rub or gallop. abd is soft, not tender , nd, no mass or hsm.  trace bilat. LE edema. foley in place. alert and oriented times 4,  no acute distress, age appropriate. moe times 4 with grossly nL strength.  Assessment/Plan 79 year old man with extensive cardiac history.  He states he has had two compression fractures.  I do not see a full CT scan report in the notes sent but it appears he has just had one compression fracture at L1.  He also has spinal stenosis and this is likely contributing to his symptoms also.  His inr never came down and it was 2.0 today.  He has had some constipation and some brbpr and some anemia.  At this point we will admit him.  We will place him on telemetry.  We will  continue  his home medications.  I would also favor conservative treatment without vertebroplasty or kyphoplasty.  He will need treatment for underlying osteoporosis.  I will check spep.  He has had radiation therapy so teriparatide would not be suggested but he would be a good candidate for a bisphosphonate in the future.  He is a full code status.  I will order PT/OT/ and CSW as he will need some sort of rehab type of placement for a time.    Ezequiel Kayser, MD 05/21/2012, 8:32 PM

## 2012-05-22 ENCOUNTER — Encounter (HOSPITAL_COMMUNITY): Payer: Medicare Other

## 2012-05-22 ENCOUNTER — Encounter (HOSPITAL_COMMUNITY): Payer: Self-pay

## 2012-05-22 DIAGNOSIS — S32009A Unspecified fracture of unspecified lumbar vertebra, initial encounter for closed fracture: Secondary | ICD-10-CM | POA: Diagnosis not present

## 2012-05-22 DIAGNOSIS — M25559 Pain in unspecified hip: Secondary | ICD-10-CM | POA: Diagnosis not present

## 2012-05-22 DIAGNOSIS — D649 Anemia, unspecified: Secondary | ICD-10-CM | POA: Diagnosis not present

## 2012-05-22 LAB — COMPREHENSIVE METABOLIC PANEL WITH GFR
ALT: 13 U/L (ref 0–53)
AST: 19 U/L (ref 0–37)
Albumin: 2.8 g/dL — ABNORMAL LOW (ref 3.5–5.2)
Alkaline Phosphatase: 70 U/L (ref 39–117)
BUN: 12 mg/dL (ref 6–23)
CO2: 27 meq/L (ref 19–32)
Calcium: 8.6 mg/dL (ref 8.4–10.5)
Chloride: 99 meq/L (ref 96–112)
Creatinine, Ser: 0.92 mg/dL (ref 0.50–1.35)
GFR calc Af Amer: >90 mL/min
GFR calc non Af Amer: 79 mL/min — ABNORMAL LOW
Glucose, Bld: 103 mg/dL — ABNORMAL HIGH (ref 70–99)
Potassium: 4.1 meq/L (ref 3.5–5.1)
Sodium: 134 meq/L — ABNORMAL LOW (ref 135–145)
Total Bilirubin: 1.5 mg/dL — ABNORMAL HIGH (ref 0.3–1.2)
Total Protein: 5.5 g/dL — ABNORMAL LOW (ref 6.0–8.3)

## 2012-05-22 LAB — CBC WITH DIFFERENTIAL/PLATELET
Eosinophils Absolute: 0.6 10*3/uL (ref 0.0–0.7)
Eosinophils Relative: 8 % — ABNORMAL HIGH (ref 0–5)
Hemoglobin: 8.9 g/dL — ABNORMAL LOW (ref 13.0–17.0)
Lymphocytes Relative: 12 % (ref 12–46)
Lymphs Abs: 0.9 10*3/uL (ref 0.7–4.0)
MCH: 30.9 pg (ref 26.0–34.0)
MCV: 88.5 fL (ref 78.0–100.0)
Monocytes Relative: 11 % (ref 3–12)
Neutrophils Relative %: 69 % (ref 43–77)
RBC: 2.88 MIL/uL — ABNORMAL LOW (ref 4.22–5.81)
WBC: 7.3 10*3/uL (ref 4.0–10.5)

## 2012-05-22 LAB — PROTIME-INR
INR: 2.3 — ABNORMAL HIGH (ref 0.00–1.49)
Prothrombin Time: 24.3 s — ABNORMAL HIGH (ref 11.6–15.2)

## 2012-05-22 MED ORDER — WARFARIN SODIUM 5 MG PO TABS
5.0000 mg | ORAL_TABLET | Freq: Every day | ORAL | Status: DC
  Administered 2012-05-22: 5 mg via ORAL
  Filled 2012-05-22 (×2): qty 1

## 2012-05-22 MED ORDER — CALCITONIN (SALMON) 200 UNIT/ACT NA SOLN
1.0000 | Freq: Every day | NASAL | Status: DC
  Administered 2012-05-22 – 2012-05-24 (×3): 1 via NASAL
  Filled 2012-05-22: qty 3.7

## 2012-05-22 MED ORDER — HYDROMORPHONE HCL PF 1 MG/ML IJ SOLN: 1.0000 mg | Freq: Two times a day (BID) | INTRAMUSCULAR | Status: DC | PRN

## 2012-05-22 MED ORDER — POLYETHYLENE GLYCOL 3350 17 G PO PACK
17.0000 g | PACK | Freq: Every day | ORAL | Status: DC | PRN
  Filled 2012-05-22: qty 1

## 2012-05-22 MED ORDER — DOCUSATE SODIUM 100 MG PO CAPS
100.0000 mg | ORAL_CAPSULE | Freq: Two times a day (BID) | ORAL | Status: DC
  Administered 2012-05-22 – 2012-05-24 (×4): 100 mg via ORAL
  Filled 2012-05-22 (×5): qty 1

## 2012-05-22 MED ORDER — ROSUVASTATIN CALCIUM 5 MG PO TABS
5.0000 mg | ORAL_TABLET | ORAL | Status: DC
  Administered 2012-05-22 – 2012-05-24 (×2): 5 mg via ORAL
  Filled 2012-05-22 (×2): qty 1

## 2012-05-22 MED ORDER — FUROSEMIDE 20 MG PO TABS
20.0000 mg | ORAL_TABLET | ORAL | Status: DC
  Administered 2012-05-22 – 2012-05-24 (×2): 20 mg via ORAL
  Filled 2012-05-22 (×2): qty 1

## 2012-05-22 MED ORDER — WARFARIN - PHARMACIST DOSING INPATIENT
Freq: Every day | Status: DC
  Administered 2012-05-23: 19:00:00

## 2012-05-22 MED ORDER — NON FORMULARY: 5.0000 mg | Status: DC

## 2012-05-22 NOTE — Evaluation (Signed)
Occupational Therapy Evaluation Patient Details Name: [name] MRN: [id number] DOB: 04-29-1932 Today's Date: 05/22/2012 Time: 1352-1450 OT Time Calculation (min): 58 min  OT Assessment / Plan / Recommendation Clinical Impression  Pt admitted after falling while vacationing in [facility] resulting in vertebral fractures.  Pt was bedbound for 6 days prior.  He presents as debilitated with decreased balance, but only requiring one person assist with a RW.  Pt reports his pain is also much improved from when he first fell.  Will follow acutely.  Recommending home with wife and HHOT.  Will need a 3 in1 and RW.    OT Assessment  Patient needs continued OT Services    Follow Up Recommendations  Home health OT;Supervision/Assistance - 24 hour    Barriers to Discharge      Equipment Recommendations  3 in 1 bedside comode (RW)    Recommendations for Other Services    Frequency  Min 2X/week    Precautions / Restrictions Precautions Precautions: Fall;Back Precaution Booklet Issued: Yes (comment) Precaution Comments: Reviewed back safety handout. Required Braces or Orthoses: Spinal Brace Spinal Brace: Lumbar corset Restrictions Weight Bearing Restrictions: No   Pertinent Vitals/Pain No pain, premedicated.    ADL  Eating/Feeding: Independent Where Assessed - Eating/Feeding: Chair Grooming: Wash/dry hands;Min guard Where Assessed - Grooming: Unsupported standing Upper Body Bathing: Set up Where Assessed - Upper Body Bathing: Unsupported sitting Lower Body Bathing: Maximal assistance Where Assessed - Lower Body Bathing: Unsupported sitting;Supported sit to stand Upper Body Dressing: Moderate assistance (back brace) Where Assessed - Upper Body Dressing: Unsupported sitting Lower Body Dressing: Maximal assistance Where Assessed - Lower Body Dressing: Unsupported sitting;Supported sit to stand Toilet Transfer: Min guard (to recliner, stood at toilet to urinate) Statistician Method: Sit  to stand Toileting - Architect and Hygiene: Min guard Where Assessed - Glass blower/designer Manipulation and Hygiene: Standing Equipment Used: Back brace;Gait belt;Rolling walker Transfers/Ambulation Related to ADLs: min guard assist with RW ADL Comments: Pt is unable to cross his foot over his opposite knee for LB ADL.    OT Diagnosis: Generalized weakness;Acute pain  OT Problem List: Decreased strength;Decreased activity tolerance;Impaired balance (sitting and/or standing);Decreased knowledge of use of DME or AE;Decreased knowledge of precautions;Pain OT Treatment Interventions: Self-care/ADL training;DME and/or AE instruction;Balance training;Patient/family education;Therapeutic activities   OT Goals Acute Rehab OT Goals OT Goal Formulation: With patient Time For Goal Achievement: 05/29/12 Potential to Achieve Goals: Good ADL Goals Pt Will Perform Grooming: with supervision;Standing at sink (at least 2 activities) ADL Goal: Grooming - Progress: Goal set today Pt Will Perform Lower Body Bathing: with supervision;Sit to stand from bed;with adaptive equipment ADL Goal: Lower Body Bathing - Progress: Goal set today Pt Will Perform Lower Body Dressing: with supervision;Sit to stand from bed;with adaptive equipment ADL Goal: Lower Body Dressing - Progress: Goal set today Pt Will Transfer to Toilet: with supervision;Ambulation;with DME;3-in-1;Maintaining back safety precautions ADL Goal: Toilet Transfer - Progress: Goal set today Pt Will Perform Toileting - Clothing Manipulation: with supervision;Standing ADL Goal: Toileting - Clothing Manipulation - Progress: Goal set today Pt Will Perform Toileting - Hygiene: with modified independence;Sitting on 3-in-1 or toilet ADL Goal: Toileting - Hygiene - Progress: Goal set today Pt Will Perform Tub/Shower Transfer: Shower transfer;Ambulation;Shower seat with back;Maintaining back safety precautions (vs 3 in 1) ADL Goal: Tub/Shower  Transfer - Progress: Goal set today Miscellaneous OT Goals Miscellaneous OT Goal #1: pt will generalize back safety in ADL. OT Goal: Miscellaneous Goal #1 - Progress: Goal set today  Visit Information  Last OT Received On: 05/22/12 Assistance Needed: +1    Subjective Data  Subjective: "I am so much better today." Patient Stated Goal: Return to PLOF.   Prior Functioning     Home Living Lives With: Spouse Available Help at Discharge: Family;Available 24 hours/day Type of Home: House Home Access: Stairs to enter [employer] of Steps: 2 Entrance Stairs-Rails: Right;Left;Can reach both Home Layout: Two level;Able to live on main level with bedroom/bathroom Bathroom Shower/Tub: Walk-in shower;Door [employer] Toilet: Standard Home Adaptive Equipment: Built-in shower seat;Grab bars in shower;Hand-held shower hose Prior Function Level of Independence: Independent Able to Take Stairs?: Yes Driving: Yes Vocation: Retired Musician: No difficulties Dominant Hand: Right         Vision/Perception Vision - History Baseline Vision: Wears glasses all the time Patient Visual Report: No change from baseline   Cognition  Cognition Overall Cognitive Status: Appears within functional limits for tasks assessed/performed Arousal/Alertness: Awake/alert Orientation Level: Appears intact for tasks assessed Behavior During Session: [facility] for tasks performed    Extremity/Trunk Assessment Right Upper Extremity Assessment RUE ROM/Strength/Tone: [facility] for tasks assessed Left Upper Extremity Assessment LUE ROM/Strength/Tone: WFL for tasks assessed     Mobility Bed Mobility Bed Mobility: Rolling Left;Left Sidelying to Sit;Sitting - Scoot to Edge of Bed Rolling Left: 5: Supervision;With rail Left Sidelying to Sit: 5: Supervision;With rails Sitting - Scoot to Edge of Bed: 5: Supervision Details for Bed Mobility Assistance: instructed in log roll  technique Transfers Transfers: Sit to Stand;Stand to Sit Sit to Stand: 4: Min guard;From elevated surface;With upper extremity assist;From bed;From chair/3-in-1 Stand to Sit: 4: Min guard;With upper extremity assist;To elevated surface;To chair/3-in-1 Details for Transfer Assistance: verbal cues for hand placement     Exercise     Balance Balance Balance Assessed: Yes Static Sitting Balance Static Sitting - Balance Support: No upper extremity supported Static Sitting - Level of Assistance: 7: Independent Static Sitting - Comment/# of Minutes: 5   End of Session OT - End of Session Activity Tolerance: Patient tolerated treatment well Patient left: in chair;with call bell/phone within reach;with family/visitor present  GO     Evern Bio 05/22/2012, 3:01 PM [phone]

## 2012-05-22 NOTE — Evaluation (Signed)
Physical Therapy Evaluation Patient Details Name: [name] MRN: [id number] DOB: September 22, 1932 Today's Date: 05/22/2012 Time: 1000-1030 PT Time Calculation (min): 30 min  PT Assessment / Plan / Recommendation Clinical Impression  Pt reports that his status is greatly improved today compared to yesterday.  Pt performing all mobility with min@ using RW.  Pt reports that wife will be able to assist at discharge, believe pt can discharge home with HHPT initially, then OPPT.    PT Assessment  Patient needs continued PT services    Follow Up Recommendations  Home health PT    Does the patient have the potential to tolerate intense rehabilitation    yes  Barriers to Discharge None      Equipment Recommendations  Rolling walker with 5" wheels    Recommendations for Other Services   HHPT  Frequency Min 5X/week    Precautions / Restrictions Precautions Precautions: Back;Fall Precaution Booklet Issued: Yes (comment) Precaution Comments: Reviewed back safety handout. Required Braces or Orthoses: Spinal Brace Spinal Brace: Lumbar corset Restrictions Weight Bearing Restrictions: No   Pertinent Vitals/Pain No complaints      Mobility  Bed Mobility Bed Mobility: Rolling Left;Left Sidelying to Sit;Sitting - Scoot to Edge of Bed Rolling Left: 5: Supervision;With rail Left Sidelying to Sit: 5: Supervision;With rails Sitting - Scoot to Edge of Bed: 5: Supervision Details for Bed Mobility Assistance: instructed in log roll technique Transfers Sit to Stand: 4: Min guard;From elevated surface;With upper extremity assist;From bed;From chair/3-in-1 Stand to Sit: 4: Min guard;With upper extremity assist;To elevated surface;To chair/3-in-1 Details for Transfer Assistance: verbal cues for hand placement Ambulation/Gait Ambulation/Gait Assistance: 4: Min assist Ambulation Distance (Feet): 120 Feet Assistive device: Rolling walker Gait velocity: decreased speed Stairs: No    Exercises      PT Diagnosis: Difficulty walking;Acute pain  PT Problem List: Decreased activity tolerance;Decreased balance;Decreased mobility;Pain PT Treatment Interventions: DME instruction;Gait training;Stair training;Functional mobility training;Therapeutic activities;Therapeutic exercise;Balance training;Patient/family education   PT Goals Acute Rehab PT Goals PT Goal Formulation: With patient Pt will Roll Supine to Right Side: with modified independence PT Goal: Rolling Supine to Right Side - Progress: Goal set today Pt will Roll Supine to Left Side: with modified independence PT Goal: Rolling Supine to Left Side - Progress: Goal set today Pt will go Supine/Side to Sit: with modified independence PT Goal: Supine/Side to Sit - Progress: Goal set today Pt will Sit at Edge of Bed: with modified independence PT Goal: Sit at Edge Of Bed - Progress: Goal set today Pt will go Sit to Supine/Side: with modified independence PT Goal: Sit to Supine/Side - Progress: Goal set today Pt will go Sit to Stand: with modified independence PT Goal: Sit to Stand - Progress: Goal set today Pt will go Stand to Sit: with modified independence PT Goal: Stand to Sit - Progress: Goal set today PT Transfer Goal: Bed to Chair/Chair to Bed - Progress: Goal set today Pt will Stand: with modified independence PT Goal: Stand - Progress: Goal set today Pt will Ambulate: >150 feet;with modified independence PT Goal: Ambulate - Progress: Goal set today Pt will Go Up / Down Stairs: 3-5 stairs;with supervision;with rail(s) PT Goal: Up/Down Stairs - Progress: Goal set today  Visit Information  Assistance Needed: +1    Subjective Data  Subjective: Pt reporting that he does not have any pain and is doing much better than the day before. Patient Stated Goal: Regain prior function and return home.   Prior Functioning  Home Living Lives With:  Spouse Available Help at Discharge: Family;Available 24 hours/day Type of Home:  House Home Access: Stairs to enter [employer] of Steps: 2 Entrance Stairs-Rails: Right;Left;Can reach both Home Layout: Two level;Able to live on main level with bedroom/bathroom Bathroom Shower/Tub: Walk-in shower;Door [employer] Toilet: Standard Home Adaptive Equipment: Built-in shower seat;Grab bars in shower;Hand-held shower hose Prior Function Level of Independence: Independent Able to Take Stairs?: Yes Driving: Yes Vocation: Retired Musician: No difficulties Dominant Hand: Right    Cognition  Cognition Overall Cognitive Status: Appears within functional limits for tasks assessed/performed Arousal/Alertness: Awake/alert Orientation Level: Appears intact for tasks assessed Behavior During Session: [facility] for tasks performed    Extremity/Trunk Assessment Right Upper Extremity Assessment RUE ROM/Strength/Tone: [facility] for tasks assessed Left Upper Extremity Assessment LUE ROM/Strength/Tone: WFL for tasks assessed Right Lower Extremity Assessment RLE ROM/Strength/Tone: Within functional levels Left Lower Extremity Assessment LLE ROM/Strength/Tone: Within functional levels   Balance Balance Balance Assessed: Yes Static Sitting Balance Static Sitting - Balance Support: No upper extremity supported Static Sitting - Level of Assistance: 7: Independent Static Sitting - Comment/# of Minutes: 5 Static Standing Balance Static Standing - Balance Support: Bilateral upper extremity supported Static Standing - Level of Assistance: 4: Min assist  End of Session PT - End of Session Equipment Utilized During Treatment: Gait belt Activity Tolerance: Patient tolerated treatment well Patient left: in chair;with call bell/phone within reach Nurse Communication: Mobility status  GP     Georges Mouse 05/22/2012, 4:07 PM

## 2012-05-22 NOTE — Progress Notes (Signed)
ANTICOAGULATION CONSULT NOTE - Initial Consult  Pharmacy Consult for Coumadin Indication: atrial fibrillation  Allergies  Allergen Reactions  . Penicillins Shortness Of Breath and Rash  . Ace Inhibitors     Has not tolerated in the past due to hyperkalemia  . Antihistamines, Diphenhydramine-Type Other (See Comments)    Inhibits urination  . Amiodarone     Patient Measurements: Height: 6\' 3"  (190.5 cm) Weight: 223 lb 12.3 oz (101.5 kg) IBW/kg (Calculated) : 84.5  Vital Signs: Temp: 97.7 F (36.5 C) (03/12 0700) BP: 100/52 mmHg (03/12 0700) Pulse Rate: 57 (03/12 0700)  Labs:  Recent Labs  05/22/12 0750  HGB 8.9*  HCT 25.5*  PLT 195  LABPROT 24.3*  INR 2.30*  CREATININE 0.92    Estimated Creatinine Clearance: 85.5 ml/min (by C-G formula based on Cr of 0.92).   Medical History: Past Medical History  Diagnosis Date  . Ischemic cardiomyopathy 05/2010    Has EF of 25%  . Diverticulitis     CURRENTLY CONTROLLED WITH NO EVIDENCE OF RECURRENT INFECTION  . AF (atrial fibrillation)     Has not tolerated amiodarone in the past. Amiodarone was stopped in September of 2010 due to side effects  . Chronic anticoagulation     on coumadin  . ICD (implantable cardiac defibrillator) in place   . Prostate cancer   . Osteoarthritis     RIGHT KNEE  . Aortic stenosis, severe     WITH PERCUTANEOUS AORTIC VALVE (TAVI) IN March 2012 at the [facility]  . MI (myocardial infarction) 1974, 32  . Cervical myelopathy   . Pulmonary embolism   . Hyperlipidemia   . GERD (gastroesophageal reflux disease)   . Esophageal stricture     WITH DILATATION  . NSVT (nonsustained ventricular tachycardia)   . Coronary artery disease   . Stroke   . Heart murmur   . Pacemaker     Medications:  Prescriptions prior to admission  Medication Sig Dispense Refill  . alfuzosin (UROXATRAL) 10 MG 24 hr tablet Take 10 mg by mouth at bedtime.       Marland Kitchen aspirin 81 MG chewable tablet Chew 81 mg by  mouth daily.       . carvedilol (COREG) 12.5 MG tablet Take 6.25 mg by mouth 2 (two) times daily with a meal.       . furosemide (LASIX) 20 MG tablet Take 20 mg by mouth every 3 (three) days.      Marland Kitchen levothyroxine (SYNTHROID, LEVOTHROID) 150 MCG tablet Take 1 tablet (150 mcg total) by mouth daily.  90 tablet  1  . Multiple Vitamin (MULTIVITAMIN) tablet Take 1 tablet by mouth daily.        . nitroGLYCERIN (NITROSTAT) 0.4 MG SL tablet Place 0.4 mg under the tongue every 5 (five) minutes as needed for chest pain.      Marland Kitchen omeprazole (PRILOSEC) 20 MG capsule Take 20 mg by mouth daily as needed (acid reflux).      . polyethylene glycol (MIRALAX / GLYCOLAX) packet Take 17 g by mouth daily as needed (constipation).      . RESTASIS 0.05 % ophthalmic emulsion Place 1 drop into both eyes every 12 (twelve) hours.       . rosuvastatin (CRESTOR) 5 MG tablet Take 5 mg by mouth daily.       . sildenafil (VIAGRA) 100 MG tablet Take 100 mg by mouth daily as needed for erectile dysfunction.      Marland Kitchen warfarin (COUMADIN) 5 MG  tablet Take 5 mg by mouth every evening.        Assessment: 79 yo M on Coumadin PTA for hx afib.  Pt is s/p fall (while he was in [place]) that resulted in immediate back pain and LE numbness.  Admitted to hospital in [place] with a compression fracture.  While there he worked with PT but patient ultimately wanted transfer to [facility] for further mgmt/rehab.  At this time, no plans for surgical intervention.  Patient was on Coumadin 5 mg daily PTA.  INR today is therapeutic.  Goal of Therapy:  INR 2-3 Monitor platelets by anticoagulation protocol: Yes   Plan:  Continue Coumadin 5 mg daily (home dose) Daily INR for now.  Consider changing to 3 x week if patient remains stable.  [employer], Pharm.D., BCPS Clinical Pharmacist Pager [phone] 05/22/2012 11:43 AM

## 2012-05-22 NOTE — Clinical Social Work Note (Addendum)
Clinical Social Work   CSW received consult for SNF. CSW reviewed chart and discussed pt with RNCM. Awaiting PT/OT evals for discharge recommendations. CSW will assess for SNF, if appropriate. Please call with any urgent needs. CSW will continue to follow.   Dede Query, MSW, [facility]ors [phone]  Addendum: 05/22/2012 at 4:51 PM Upon chart review, PT/OT are recommending HHPT/OT at discharge. CSW will update RNCM. CSW is signing off as no further needs identified. Please reconsult if a need arises prior to discharge.  Dede Query, MSW, [facility]

## 2012-05-22 NOTE — Progress Notes (Addendum)
Subjective: Pt transferred to me from [facility] for Traumatic Lumbar compression Fractures and Back pain, FTT and deconditioning. He was reported to still need inpatient hospitalization due to need for IV pain meds, Hbg trnding down and other issues. Feeling better today.  Pain is decreasing. Flight was wonderful. Lasix and Urinated out plenty of fluid. INR 2.3. Hbg 8.9 Eating and drinking well. Not a normal BM yet. Still c foley cath in place. Numbness improving. More pain at L hip than Back. Has walked with walker in [place].  Objective: Vital signs in last 24 hours: Temp:  [97.7 F (36.5 C)-98.2 F (36.8 C)] 97.7 F (36.5 C) (03/12 0700) Pulse Rate:  [48-57] 57 (03/12 0700) Resp:  [18] 18 (03/12 0700) BP: (100-133)/(52-63) 100/52 mmHg (03/12 0700) SpO2:  [95 %-99 %] 99 % (03/12 0700) Weight:  [101.5 kg (223 lb 12.3 oz)] 101.5 kg (223 lb 12.3 oz) (03/12 0525) Weight change:  Last BM Date: 05/21/12  CBG (last 3)  No results found for this basename: GLUCAP,  in the last 72 hours  Intake/Output from previous day:  Intake/Output Summary (Last 24 hours) at 05/22/12 0905 Last data filed at 05/22/12 0500  Gross per 24 hour  Intake    240 ml  Output   1700 ml  Net  -1460 ml   03/11 0701 - 03/12 0700 In: 240 [P.O.:240] Out: 1700 [Urine:1700]   Physical Exam  General appearance: A and O Eyes: no scleral icterus Throat: oropharynx moist without erythema Resp: CTA B Cardio: Irreg Irreg. Sternotomy. Pacer GI: soft, non-tender; bowel sounds normal; no masses,  no organomegaly Extremities: no clubbing, cyanosis or edema R hip with massive Eccymoses and Bruise/Hematoma.   Lab Results: No results found for this basename: NA, K, CL, CO2, GLUCOSE, BUN, CREATININE, CALCIUM, MG, PHOS,  in the last 72 hours  No results found for this basename: AST, ALT, ALKPHOS, BILITOT, PROT, ALBUMIN,  in the last 72 hours   Recent Labs  05/22/12 0750  WBC 7.3  NEUTROABS 5.0  HGB  8.9*  HCT 25.5*  MCV 88.5  PLT 195    Lab Results  Component Value Date   INR 2.30* 05/22/2012   INR 2.4 05/13/2012   INR 3.4 05/06/2012    No results found for this basename: CKTOTAL, CKMB, CKMBINDEX, TROPONINI,  in the last 72 hours  No results found for this basename: TSH, T4TOTAL, FREET3, T3FREE, THYROIDAB,  in the last 72 hours  No results found for this basename: VITAMINB12, FOLATE, FERRITIN, TIBC, IRON, RETICCTPCT,  in the last 72 hours  Micro Results: No results found for this or any previous visit (from the past 240 hour(s)).   Studies/Results: No results found.   Medications: Scheduled: . alfuzosin  10 mg Oral QHS  . aspirin  81 mg Oral Daily  . atorvastatin  10 mg Oral q1800  . carvedilol  6.25 mg Oral BID WC  . cycloSPORINE  1 drop Both Eyes Q12H  . docusate sodium  100 mg Oral BID  . levothyroxine  150 mcg Oral QAC breakfast  . multivitamin with minerals  1 tablet Oral Daily  . pantoprazole  40 mg Oral Daily  . sodium chloride  3 mL Intravenous Q12H   Continuous:    Assessment/Plan: Principal Problem:   Vertebral fracture, osteoporotic  L1 and L4 Compression Fractue - Traumatic and presumed osteoporotic - VP and KP on hold.  Pain control and rehab planned.  Was seen and evaluated by [facility] in  [place] - Will not consult them here.  Continue Ab Binder for support.  I agree with Dr Waynard Edwards and would also favor conservative treatment without vertebroplasty or kyphoplasty. He will need treatment for underlying osteoporosis. Spep pending. He has had radiation therapy so teriparatide would not be suggested but he would be a good candidate for a bisphosphonate in the future. Ok for up with assistance and PT/OT/ and CSW as he will need some sort of rehab type of placement for a time.  Since being here I am OK with no IV pain control and only Norco prn.  He is improving and I am happy with tha.  Some of R leg numbness may have more to do with Blood/Hematoma compression of  Nerves.  Spinal Stenosis - Pain control.  Anemia - Severe flank and hip bruising and hematoma.  HD stable.  If some of his bleeding is GI - His last Colon was 2009.  NO GI W/up indicated at this time.  Monitor Hbg as it is better today.  Chronic Anticoagulation due to AFib and H/O PE.  INR was up to 3.7 in [place].  2.0 yesterday. 2.3 today.  He has not had coumadin in several days.  Will get Pharm to eval and start dosing tonight.  CAD/CABG twice/H/O MI - No current Angina.  Cardiac Rehab as outpatient.  Afib on chronic anticoagulation - INR Goal 2-3.  He is rate controlled.  VVI Pacer/ICD/H/O NSVT -  Dr Graciela Husbands  Ischemic CM c EF 25-30% - ECHO done in [place] was stable. Back on Lasix  Severe AS S/P TAVI 05/2010 at [facility] - doing well.  Prostate CA S/P Seeds/XRT and BPH - Uroxatrol.   Hypothyroidism on Rx  Constipation - Miralax fiber and Metamucil as outpatient.  He has not had formed BM yet however he has had Bowel prep and lots of liq stools.  Chronic OA  GERD with H/O Stricture S/P Dilitation - Omeprazole.  TIA 11/2011  Hyperlipidemia - Only able to tolerate Crestor 5 QOD with less than perfect control.  He did not tolerate Lipitor in past  He has had some constipation and some brbpr and some anemia - continue to eval and adjust plan prn.  D/C Foley  DVT Prophylaxis  Goal today is to continue support, pain control, increase activity, work with PT/OT/CW and follow labs through tomorrow.  If progressing towards Rehab - he may be ready by tomorrow so please fax out [id number].  If he continues to progress better than expected and may go home we could look into that as well.  Await recs per PT    LOS: 1 day   RUSSO,Bronson M 05/22/2012, 9:05 AM

## 2012-05-23 ENCOUNTER — Encounter (HOSPITAL_COMMUNITY): Payer: Medicare Other

## 2012-05-23 DIAGNOSIS — D649 Anemia, unspecified: Secondary | ICD-10-CM | POA: Diagnosis not present

## 2012-05-23 DIAGNOSIS — M25559 Pain in unspecified hip: Secondary | ICD-10-CM | POA: Diagnosis not present

## 2012-05-23 DIAGNOSIS — S32009A Unspecified fracture of unspecified lumbar vertebra, initial encounter for closed fracture: Secondary | ICD-10-CM | POA: Diagnosis not present

## 2012-05-23 DIAGNOSIS — M4800 Spinal stenosis, site unspecified: Secondary | ICD-10-CM | POA: Diagnosis not present

## 2012-05-23 LAB — CBC WITH DIFFERENTIAL/PLATELET
Basophils Absolute: 0 10*3/uL (ref 0.0–0.1)
Eosinophils Absolute: 0.6 10*3/uL (ref 0.0–0.7)
Hemoglobin: 9.2 g/dL — ABNORMAL LOW (ref 13.0–17.0)
Lymphocytes Relative: 11 % — ABNORMAL LOW (ref 12–46)
Lymphs Abs: 0.9 10*3/uL (ref 0.7–4.0)
MCH: 30.9 pg (ref 26.0–34.0)
Monocytes Relative: 10 % (ref 3–12)
Neutrophils Relative %: 71 % (ref 43–77)
RBC: 2.98 MIL/uL — ABNORMAL LOW (ref 4.22–5.81)
WBC: 8.2 10*3/uL (ref 4.0–10.5)

## 2012-05-23 LAB — COMPREHENSIVE METABOLIC PANEL
ALT: 12 U/L (ref 0–53)
AST: 18 U/L (ref 0–37)
Calcium: 8.4 mg/dL (ref 8.4–10.5)
GFR calc Af Amer: >90 mL/min (ref >90)
Glucose, Bld: 106 mg/dL — ABNORMAL HIGH (ref 70–99)
Sodium: 132 mEq/L — ABNORMAL LOW (ref 135–145)
Total Protein: 5.5 g/dL — ABNORMAL LOW (ref 6.0–8.3)

## 2012-05-23 LAB — PROTIME-INR: Prothrombin Time: 21.7 seconds — ABNORMAL HIGH (ref 11.6–15.2)

## 2012-05-23 MED ORDER — WARFARIN SODIUM 7.5 MG PO TABS
7.5000 mg | ORAL_TABLET | Freq: Once | ORAL | Status: AC
  Administered 2012-05-23: 7.5 mg via ORAL
  Filled 2012-05-23: qty 1

## 2012-05-23 NOTE — Progress Notes (Signed)
Occupational Therapy Treatment Patient Details Name: [name] MRN: [id number] DOB: 06/28/1932 Today's Date: 05/23/2012 Time: 4098-1191 OT Time Calculation (min): 31 min  OT Assessment / Plan / Recommendation Comments on Treatment Session Pt did very well with mobility and safety today.  Should be ready for d/c anytime and will need 3:1 commode.    Follow Up Recommendations  Home health OT;Supervision/Assistance - 24 hour    Barriers to Discharge       Equipment Recommendations  3 in 1 bedside comode    Recommendations for Other Services    Frequency Min 2X/week   Plan Discharge plan remains appropriate    Precautions / Restrictions Precautions Precautions: Back;Fall Precaution Comments: Reviewed back safety handout. Required Braces or Orthoses: Spinal Brace Spinal Brace: Lumbar corset Restrictions Weight Bearing Restrictions: No   Pertinent Vitals/Pain Pt with no real c/o pain.  He just feel sore.     ADL  Grooming: Performed;Wash/dry hands;Supervision/safety Where Assessed - Grooming: Unsupported standing Upper Body Dressing: Performed;Independent Where Assessed - Upper Body Dressing: Unsupported sitting Lower Body Dressing: Performed;Minimal assistance (has hard time crossing R leg due to swelling) Where Assessed - Lower Body Dressing: Unsupported sitting;Supported sit to stand Toilet Transfer: Performed;Supervision/safety Toilet Transfer Method: Other (comment) (ambulated to BR) Toilet Transfer Equipment: Raised toilet seat with arms (or 3-in-1 over toilet);Grab bars Toileting - Clothing Manipulation and Hygiene: Supervision/safety;Performed Where Assessed - Toileting Clothing Manipulation and Hygiene: Sit to stand from 3-in-1 or toilet Equipment Used: Back brace;Gait belt;Rolling walker Transfers/Ambulation Related to ADLs: S with rolling walker in room and in hallway/bathroom ADL Comments: Today pt was able to cross L leg but not R leg b/c of swelling.  Talked  to pt about AE, but he said wife would help him until this swelling went down and he could cross that leg.    OT Diagnosis:    OT Problem List:   OT Treatment Interventions:     OT Goals Acute Rehab OT Goals OT Goal Formulation: With patient Time For Goal Achievement: 05/29/12 Potential to Achieve Goals: Good ADL Goals Pt Will Perform Grooming: with supervision;Standing at sink ADL Goal: Grooming - Progress: Met Pt Will Perform Lower Body Bathing: with supervision;Sit to stand from bed;with adaptive equipment Pt Will Perform Lower Body Dressing: with supervision;Sit to stand from bed;with adaptive equipment ADL Goal: Lower Body Dressing - Progress: Progressing toward goals Pt Will Transfer to Toilet: with supervision;Ambulation;with DME;3-in-1;Maintaining back safety precautions ADL Goal: Toilet Transfer - Progress: Met Pt Will Perform Toileting - Clothing Manipulation: with supervision;Standing ADL Goal: Toileting - Clothing Manipulation - Progress: Met Pt Will Perform Toileting - Hygiene: with modified independence;Sitting on 3-in-1 or toilet ADL Goal: Toileting - Hygiene - Progress: Progressing toward goals Pt Will Perform Tub/Shower Transfer: Shower transfer;Ambulation;Shower seat with back;Maintaining back safety precautions Miscellaneous OT Goals Miscellaneous OT Goal #1: pt will generalize back safety in ADL. OT Goal: Miscellaneous Goal #1 - Progress: Met  Visit Information  Last OT Received On: 05/23/12 Assistance Needed: +1    Subjective Data      Prior Functioning       Cognition  Cognition Overall Cognitive Status: Appears within functional limits for tasks assessed/performed Arousal/Alertness: Awake/alert Orientation Level: Appears intact for tasks assessed Behavior During Session: [facility] for tasks performed Cognition - Other Comments: intact.    Mobility  Bed Mobility Bed Mobility: Supine to Sit;Sitting - Scoot to Edge of Bed Supine to Sit: 6: Modified  independent (Device/Increase time);HOB flat Sitting - Scoot to [place] of  Bed: 5: Supervision Transfers Transfers: Sit to Stand;Stand to Sit Sit to Stand: 5: Supervision;From bed Stand to Sit: 5: Supervision;To bed Details for Transfer Assistance: verbal cues for hand placement    Exercises      Balance Balance Balance Assessed: Yes Static Standing Balance Static Standing - Balance Support: Bilateral upper extremity supported Static Standing - Level of Assistance: 5: Stand by assistance Static Standing - Comment/# of Minutes: 3   End of Session OT - End of Session Equipment Utilized During Treatment: Back brace Activity Tolerance: Patient tolerated treatment well Patient left: in chair;with call bell/phone within reach;with family/visitor present Nurse Communication: Mobility status  GO     Hope Budds 05/23/2012, 11:05 AM [phone]

## 2012-05-23 NOTE — Progress Notes (Signed)
Pt had 5 beats of Vtach around 2330. Pt appears asymptomatic, denies feeling any symptoms and was sleeping when nurse arrived to room. Dr. Link Snuffer, on call for Dr. Timothy Lasso, was paged. No orders given. Will continue to monitor pt. Salvadore Oxford, RN 05/23/12 0010

## 2012-05-23 NOTE — Progress Notes (Signed)
Subjective: Pt transferred to me from [facility] for Traumatic Lumbar compression Fractures and Back pain, FTT and deconditioning. He was reported to still need inpatient hospitalization due to need for IV pain meds, Hbg trending down and other issues.  Feeling better today.  Pain is decreasing. Numbness is improving Eating and drinking well.  Did well with therapy. Stiff and sore. Now able to get up and move a little. Not a normal BM yet. Foley out and able to void.  Less back spasm   Objective: Vital signs in last 24 hours: Temp:  [97.8 F (36.6 C)-99 F (37.2 C)] 98.6 F (37 C) (03/13 0616) Pulse Rate:  [53-100] 53 (03/13 0616) Resp:  [16-19] 16 (03/13 0616) BP: (101-130)/(52-74) 118/74 mmHg (03/13 0616) SpO2:  [97 %-100 %] 99 % (03/13 0616) Weight:  [97.6 kg (215 lb 2.7 oz)] 97.6 kg (215 lb 2.7 oz) (03/13 0616) Weight change: -3.9 kg (-8 lb 9.6 oz) Last BM Date: 05/22/12  CBG (last 3)  No results found for this basename: GLUCAP,  in the last 72 hours  Intake/Output from previous day:  Intake/Output Summary (Last 24 hours) at 05/23/12 0816 Last data filed at 05/22/12 2130  Gross per 24 hour  Intake    560 ml  Output    575 ml  Net    -15 ml   03/12 0701 - 03/13 0700 In: 560 [P.O.:560] Out: 575 [Urine:575]   Physical Exam  General appearance: A and O Eyes: no scleral icterus Throat: oropharynx moist without erythema Resp: CTA B Cardio: Irreg Irreg. Sternotomy. Pacer GI: soft, non-tender; bowel sounds normal; no masses,  no organomegaly Extremities: no clubbing, cyanosis. 1+ E on R R hip with massive Eccymoses and Bruise/Hematoma.   Lab Results:  Recent Labs  05/22/12 0750 05/23/12 0710  NA 134* 132*  K 4.1 4.2  CL 99 98  CO2 27 28  GLUCOSE 103* 106*  BUN 12 12  CREATININE 0.92 0.92  CALCIUM 8.6 8.4  MG 2.1 1.9     Recent Labs  05/22/12 0750 05/23/12 0710  AST 19 18  ALT 13 12  ALKPHOS 70 74  BILITOT 1.5* 1.4*  PROT 5.5* 5.5*   ALBUMIN 2.8* 2.8*     Recent Labs  05/22/12 0750 05/23/12 0710  WBC 7.3 8.2  NEUTROABS 5.0 5.8  HGB 8.9* 9.2*  HCT 25.5* 26.5*  MCV 88.5 88.9  PLT 195 236    Lab Results  Component Value Date   INR 1.98* 05/23/2012   INR 2.30* 05/22/2012   INR 2.4 05/13/2012    No results found for this basename: CKTOTAL, CKMB, CKMBINDEX, TROPONINI,  in the last 72 hours  No results found for this basename: TSH, T4TOTAL, FREET3, T3FREE, THYROIDAB,  in the last 72 hours  No results found for this basename: VITAMINB12, FOLATE, FERRITIN, TIBC, IRON, RETICCTPCT,  in the last 72 hours  Micro Results: No results found for this or any previous visit (from the past 240 hour(s)).   Studies/Results: No results found.   Medications: Scheduled: . alfuzosin  10 mg Oral QHS  . aspirin  81 mg Oral Daily  . calcitonin (salmon)  1 spray Alternating Nares Daily  . carvedilol  6.25 mg Oral BID WC  . cycloSPORINE  1 drop Both Eyes Q12H  . docusate sodium  100 mg Oral BID  . furosemide  20 mg Oral 3 times weekly  . levothyroxine  150 mcg Oral QAC breakfast  . multivitamin with minerals  1 tablet Oral Daily  . pantoprazole  40 mg Oral Daily  . rosuvastatin  5 mg Oral QODAY  . sodium chloride  3 mL Intravenous Q12H  . warfarin  5 mg Oral q1800  . Warfarin - Pharmacist Dosing Inpatient   Does not apply q1800   Continuous:    Assessment/Plan: Principal Problem:   Vertebral fracture, osteoporotic  L1 and L4 Compression Fractue - Traumatic and presumed osteoporotic - VP and KP on hold and not needed.  Pain is controlled and rehab planned.  Continue Ab Binder for support.  Continue conservative treatment without vertebroplasty or kyphoplasty. He will need treatment for underlying osteoporosis. Spep pending.  Continue PT/OT/ and CSW and he is getting more functional and may actually go home tomorrow as both PT/OT recommended Home Health services.  Some of R leg numbness may have more to do with  Blood/Hematoma compression of Nerves.  Continue Miacalcin NS.  Spinal Stenosis - Pain control.  Anemia - Severe flank and hip bruising and hematoma.  HD stable.  If some of his bleeding is GI - His last Colon was 2009.  NO GI W/up indicated at this time.  Monitor Hbg  And now > 9.  Great.  Chronic Anticoagulation due to AFib and H/O PE.  INR was up to 3.7 in [place].  2.0  - 2.3 - 1.98. Back on coumadin CAD/CABG twice/H/O MI - No current Angina.  Cardiac Rehab as outpatient.  Afib on chronic anticoagulation - INR Goal 2-3.  He is rate controlled.  VVI Pacer/ICD/H/O NSVT -  Dr Graciela Husbands  Ischemic CM c EF 25-30% - ECHO done in [place] was stable. Back on Lasix.  Leg is swollen and no evidence Vol overload - probable more to do with Extravasation and not DVT.  Severe AS S/P TAVI 05/2010 at [facility] - doing well.  Prostate CA S/P Seeds/XRT and BPH - Uroxatrol.   Foley out and urinating well  Hypothyroidism on Rx  Constipation - Miralax fiber and Metamucil as outpatient.  He has not had formed BM yet however he has had Bowel prep and lots of liq stools.  Chronic OA  GERD with H/O Stricture S/P Dilitation - Omeprazole.  TIA 11/2011  Hyperlipidemia - Only able to tolerate Crestor 5 QOD with less than perfect control.  He did not tolerate Lipitor in past  He has had some constipation and some brbpr and some anemia - continue to eval and adjust plan prn.  D/C Foley  DVT Prophylaxis  Goal today is to continue support, pain control, increase activity, work with PT/OT/CW and follow labs.  If progressing towards Rehab - he may be ready for D/C tomorrow maybe even to home.     LOS: 2 days   [name],Jeffrey M 05/23/2012, 8:16 AM

## 2012-05-23 NOTE — Progress Notes (Signed)
ANTICOAGULATION CONSULT NOTE - Initial Consult  Pharmacy Consult for Coumadin Indication: atrial fibrillation  Allergies  Allergen Reactions  . Penicillins Shortness Of Breath and Rash  . Ace Inhibitors     Has not tolerated in the past due to hyperkalemia  . Antihistamines, Diphenhydramine-Type Other (See Comments)    Inhibits urination  . Amiodarone     Patient Measurements: Height: 6\' 3"  (190.5 cm) Weight: 215 lb 2.7 oz (97.6 kg) IBW/kg (Calculated) : 84.5  Vital Signs: Temp: 98.2 F (36.8 C) (03/13 1025) Temp src: Oral (03/13 1025) BP: 110/78 mmHg (03/13 1025) Pulse Rate: 78 (03/13 1025)  Labs:  Recent Labs  05/22/12 0750 05/23/12 0710  HGB 8.9* 9.2*  HCT 25.5* 26.5*  PLT 195 236  LABPROT 24.3* 21.7*  INR 2.30* 1.98*  CREATININE 0.92 0.92    Estimated Creatinine Clearance: 79.1 ml/min (by C-G formula based on Cr of 0.92).   Medical History: Past Medical History  Diagnosis Date  . Ischemic cardiomyopathy 05/2010    Has EF of 25%  . Diverticulitis     CURRENTLY CONTROLLED WITH NO EVIDENCE OF RECURRENT INFECTION  . AF (atrial fibrillation)     Has not tolerated amiodarone in the past. Amiodarone was stopped in September of 2010 due to side effects  . Chronic anticoagulation     on coumadin  . ICD (implantable cardiac defibrillator) in place   . Prostate cancer   . Osteoarthritis     RIGHT KNEE  . Aortic stenosis, severe     WITH PERCUTANEOUS AORTIC VALVE (TAVI) IN March 2012 at the [facility]  . MI (myocardial infarction) 1974, 68  . Cervical myelopathy   . Pulmonary embolism   . Hyperlipidemia   . GERD (gastroesophageal reflux disease)   . Esophageal stricture     WITH DILATATION  . NSVT (nonsustained ventricular tachycardia)   . Coronary artery disease   . Stroke   . Heart murmur   . Pacemaker     Medications:  Prescriptions prior to admission  Medication Sig Dispense Refill  . alfuzosin (UROXATRAL) 10 MG 24 hr tablet Take 10 mg  by mouth at bedtime.       Marland Kitchen aspirin 81 MG chewable tablet Chew 81 mg by mouth daily.       . carvedilol (COREG) 12.5 MG tablet Take 6.25 mg by mouth 2 (two) times daily with a meal.       . furosemide (LASIX) 20 MG tablet Take 20 mg by mouth every 3 (three) days.      Marland Kitchen levothyroxine (SYNTHROID, LEVOTHROID) 150 MCG tablet Take 1 tablet (150 mcg total) by mouth daily.  90 tablet  1  . Multiple Vitamin (MULTIVITAMIN) tablet Take 1 tablet by mouth daily.        . nitroGLYCERIN (NITROSTAT) 0.4 MG SL tablet Place 0.4 mg under the tongue every 5 (five) minutes as needed for chest pain.      Marland Kitchen omeprazole (PRILOSEC) 20 MG capsule Take 20 mg by mouth daily as needed (acid reflux).      . polyethylene glycol (MIRALAX / GLYCOLAX) packet Take 17 g by mouth daily as needed (constipation).      . RESTASIS 0.05 % ophthalmic emulsion Place 1 drop into both eyes every 12 (twelve) hours.       . rosuvastatin (CRESTOR) 5 MG tablet Take 5 mg by mouth daily.       . sildenafil (VIAGRA) 100 MG tablet Take 100 mg by mouth daily as needed  for erectile dysfunction.      Marland Kitchen warfarin (COUMADIN) 5 MG tablet Take 5 mg by mouth every evening.        Assessment: 79 yo M on Coumadin PTA for hx afib.  Pt is s/p fall (while he was in [place]) that resulted in immediate back pain and LE numbness.  Admitted to hospital in [place] with a compression fracture.  While there he worked with PT but patient ultimately wanted transfer to [facility] for further mgmt/rehab.  At this time, no plans for surgical intervention.  Patient was on Coumadin 5 mg daily PTA.  INR today is slightly less than goal.  Goal of Therapy:  INR 2-3 Monitor platelets by anticoagulation protocol: Yes   Plan:  Increase Coumadin to 7.5 mg PO x 1 tonight. Daily INR for now.    [employer], Pharm.D., BCPS Clinical Pharmacist Pager [phone] 05/23/2012 11:02 AM

## 2012-05-23 NOTE — Progress Notes (Signed)
Noted significant swelling in RLE as compared to LLE.  Pt says this is new since his fall.  No tenderness with calf reported. Tory Emerald, [place] [phone]

## 2012-05-23 NOTE — Progress Notes (Signed)
Physical Therapy Treatment Patient Details Name: [name] MRN: [id number] DOB: 1932/11/22 Today's Date: 05/23/2012 Time:  -     PT Assessment / Plan / Recommendation Comments on Treatment Session  Pt progressing with PT goals & mobility at this date.  Initiated stair training this session.  Pt reports having 2 rails on steps however once back into pt's room he states rails are too far apart to reach with bil UE's therefore pt will need to practice stairs again with 1 rail to mimic home set-up.      Follow Up Recommendations  Home health PT     Does the patient have the potential to tolerate intense rehabilitation     Barriers to Discharge        Equipment Recommendations  Rolling walker with 5" wheels    Recommendations for Other Services    Frequency Min 5X/week   Plan Discharge plan remains appropriate    Precautions / Restrictions Precautions Precautions: Back;Fall Precaution Comments: Pt unable to verbalize any of the 3 back precautions Required Braces or Orthoses: Spinal Brace Spinal Brace: Lumbar corset Restrictions Weight Bearing Restrictions: No       Mobility  Bed Mobility Bed Mobility: Rolling Left;Left Sidelying to Sit;Sitting - Scoot to Edge of Bed;Sit to Sidelying Left Rolling Left: 5: Supervision;With rail Left Sidelying to Sit: 5: Supervision;HOB flat;With rails Sitting - Scoot to Edge of Bed: 5: Supervision Sit to Sidelying Left: 4: Min assist Details for Bed Mobility Assistance: Cues for sequencing, technique, & reinforcement of back precautions.  (A) to maintain back precautions with sit>side>supine Transfers Transfers: Sit to Stand;Stand to Sit Sit to Stand: 5: Supervision;With upper extremity assist;From bed Stand to Sit: 5: Supervision;With upper extremity assist;To bed Details for Transfer Assistance: Cues for safest hand placement & technique.   Ambulation/Gait Ambulation/Gait Assistance: 4: Min guard Ambulation Distance (Feet): 200  Feet Assistive device: Rolling walker Ambulation/Gait Assistance Details: Cues for tall posture & body positioning inside RW.   Gait Pattern: Step-through pattern;Decreased stride length;Right flexed knee in stance;Left flexed knee in stance;Trunk flexed Stairs: Yes Stairs Assistance: 4: Min guard Stair Management Technique: Two rails;Step to pattern;Forwards Number of Stairs: 3 (2x's)      PT Goals Acute Rehab PT Goals PT Goal Formulation: With patient Pt will Roll Supine to Right Side: with modified independence Pt will Roll Supine to Left Side: with modified independence PT Goal: Rolling Supine to Left Side - Progress: Progressing toward goal Pt will go Supine/Side to Sit: with modified independence PT Goal: Supine/Side to Sit - Progress: Progressing toward goal Pt will Sit at [place] of Bed: with modified independence Pt will go Sit to Supine/Side: with modified independence PT Goal: Sit to Supine/Side - Progress: Progressing toward goal Pt will go Sit to Stand: with modified independence PT Goal: Sit to Stand - Progress: Progressing toward goal Pt will go Stand to Sit: with modified independence PT Goal: Stand to Sit - Progress: Progressing toward goal Pt will Stand: with modified independence PT Goal: Stand - Progress: Progressing toward goal Pt will Ambulate: >150 feet;with modified independence PT Goal: Ambulate - Progress: Progressing toward goal Pt will Go Up / Down Stairs: 3-5 stairs;with supervision;with rail(s) PT Goal: Up/Down Stairs - Progress: Progressing toward goal  Visit Information  Last PT Received On: 05/23/12 Assistance Needed: +1    Subjective Data      Cognition  Cognition Overall Cognitive Status: Appears within functional limits for tasks assessed/performed Arousal/Alertness: Awake/alert Orientation Level: Appears intact for tasks  assessed Behavior During Session: [facility] for tasks performed    Balance     End of Session PT - End of  Session Equipment Utilized During Treatment: Gait belt;Back brace Activity Tolerance: Patient tolerated treatment well Patient left: in bed;with call bell/phone within reach;with family/visitor present Nurse Communication: Mobility status     Verdell Face, [place] [phone] 05/23/2012

## 2012-05-24 DIAGNOSIS — S32009A Unspecified fracture of unspecified lumbar vertebra, initial encounter for closed fracture: Secondary | ICD-10-CM | POA: Diagnosis not present

## 2012-05-24 DIAGNOSIS — D649 Anemia, unspecified: Secondary | ICD-10-CM | POA: Diagnosis not present

## 2012-05-24 DIAGNOSIS — M25559 Pain in unspecified hip: Secondary | ICD-10-CM | POA: Diagnosis not present

## 2012-05-24 DIAGNOSIS — I2699 Other pulmonary embolism without acute cor pulmonale: Secondary | ICD-10-CM

## 2012-05-24 DIAGNOSIS — M4800 Spinal stenosis, site unspecified: Secondary | ICD-10-CM | POA: Diagnosis not present

## 2012-05-24 DIAGNOSIS — R609 Edema, unspecified: Secondary | ICD-10-CM | POA: Diagnosis not present

## 2012-05-24 LAB — COMPREHENSIVE METABOLIC PANEL
CO2: 29 mEq/L (ref 19–32)
Calcium: 8.3 mg/dL — ABNORMAL LOW (ref 8.4–10.5)
Creatinine, Ser: 0.85 mg/dL (ref 0.50–1.35)
GFR calc Af Amer: >90 mL/min (ref >90)
GFR calc non Af Amer: 81 mL/min — ABNORMAL LOW (ref >90)
Glucose, Bld: 98 mg/dL (ref 70–99)
Total Protein: 5.4 g/dL — ABNORMAL LOW (ref 6.0–8.3)

## 2012-05-24 LAB — CBC WITH DIFFERENTIAL/PLATELET
Eosinophils Absolute: 0.5 10*3/uL (ref 0.0–0.7)
Lymphocytes Relative: 11 % — ABNORMAL LOW (ref 12–46)
Lymphs Abs: 0.9 10*3/uL (ref 0.7–4.0)
MCH: 31.4 pg (ref 26.0–34.0)
Neutro Abs: 5.5 10*3/uL (ref 1.7–7.7)
Neutrophils Relative %: 70 % (ref 43–77)
Platelets: 259 10*3/uL (ref 150–400)
RBC: 2.9 MIL/uL — ABNORMAL LOW (ref 4.22–5.81)
WBC: 7.8 10*3/uL (ref 4.0–10.5)

## 2012-05-24 LAB — MAGNESIUM: Magnesium: 2 mg/dL (ref 1.5–2.5)

## 2012-05-24 LAB — PROTEIN ELECTROPHORESIS, SERUM
Albumin ELP: 57.1 % (ref 55.8–66.1)
Alpha-1-Globulin: 8.1 % — ABNORMAL HIGH (ref 2.9–4.9)
Alpha-2-Globulin: 11.9 % — ABNORMAL HIGH (ref 7.1–11.8)
Gamma Globulin: 11.2 % (ref 11.1–18.8)

## 2012-05-24 LAB — PROTIME-INR
INR: 2.03 — ABNORMAL HIGH (ref 0.00–1.49)
Prothrombin Time: 22.1 seconds — ABNORMAL HIGH (ref 11.6–15.2)

## 2012-05-24 MED ORDER — ACETAMINOPHEN 325 MG PO TABS: 650.0000 mg | ORAL_TABLET | Freq: Four times a day (QID) | ORAL | Status: DC | PRN

## 2012-05-24 MED ORDER — HYDROCODONE-ACETAMINOPHEN 5-325 MG PO TABS: 1.0000 | ORAL_TABLET | Freq: Four times a day (QID) | ORAL | Status: DC | PRN

## 2012-05-24 MED ORDER — NITROGLYCERIN 0.4 MG SL SUBL: 0.4000 mg | SUBLINGUAL_TABLET | SUBLINGUAL | Status: DC | PRN

## 2012-05-24 MED ORDER — CALCITONIN (SALMON) 200 UNIT/ACT NA SOLN: NASAL | Status: DC

## 2012-05-24 NOTE — Progress Notes (Signed)
Physical Therapy Treatment Patient Details Name: [name] MRN: [id number] DOB: Jul 29, 1932 Today's Date: 05/24/2012 Time: 0865-7846 PT Time Calculation (min): 24 min  PT Assessment / Plan / Recommendation Comments on Treatment Session  Instructed in up/down steps as he will do at home. Also instructed in stepping up onto threshold on door forward with RW. Wife present throughout. Instructed in car transfer. Confirmed pt/wife agreeable to HHPT.     Follow Up Recommendations  Home health PT;Supervision/Assistance - 24 hour     Does the patient have the potential to tolerate intense rehabilitation     Barriers to Discharge        Equipment Recommendations  Rolling walker with 5" wheels    Recommendations for Other Services    Frequency Min 5X/week   Plan Discharge plan remains appropriate;Frequency remains appropriate    Precautions / Restrictions Precautions Precautions: Back;Fall Precaution Booklet Issued: Yes (comment) Precaution Comments: able to recall 3/3 and adhered to these with mobility Required Braces or Orthoses: Spinal Brace Spinal Brace: Lumbar corset;Applied in sitting position   Pertinent Vitals/Pain no apparent distress     Mobility  Bed Mobility Bed Mobility: Rolling Left;Left Sidelying to Sit;Sitting - Scoot to [place] of Bed;Sit to Sidelying Left Rolling Left: 6: Modified independent (Device/Increase time) Left Sidelying to Sit: 5: Supervision;HOB flat Sitting - Scoot to Edge of Bed: 6: Modified independent (Device/Increase time) Sit to Sidelying Left: 6: Modified independent (Device/Increase time);HOB flat Details for Bed Mobility Assistance: supervision to come to sitting due to incr effort and near need for assist Transfers Transfers: Sit to Stand;Stand to Sit Sit to Stand: 5: Supervision;With upper extremity assist;From bed;Other (comment) Stand to Sit: 5: Supervision;With upper extremity assist;To elevated surface;To bed Details for Transfer  Assistance: elevated to simulate home bed; vc for safe use of RW Ambulation/Gait Ambulation/Gait Assistance: 5: Supervision Ambulation Distance (Feet): 200 Feet Assistive device: Rolling walker Ambulation/Gait Assistance Details: Cues for tall posture & body positioning inside RW.  Gait Pattern: Within Functional Limits Gait velocity: decreased speed Stairs Assistance: 4: Min guard Stair Management Technique: One rail Right;Sideways Number of Stairs: 2    Exercises     PT Diagnosis:    PT Problem List:   PT Treatment Interventions:     PT Goals Acute Rehab PT Goals Pt will Roll Supine to Right Side: with modified independence PT Goal: Rolling Supine to Right Side - Progress: Met Pt will Roll Supine to Left Side: with modified independence PT Goal: Rolling Supine to Left Side - Progress: Met Pt will go Supine/Side to Sit: with modified independence PT Goal: Supine/Side to Sit - Progress: Progressing toward goal Pt will Sit at Edge of Bed: with modified independence PT Goal: Sit at [place] Of Bed - Progress: Met Pt will go Sit to Supine/Side: with modified independence PT Goal: Sit to Supine/Side - Progress: Progressing toward goal Pt will go Sit to Stand: with modified independence PT Goal: Sit to Stand - Progress: Progressing toward goal Pt will go Stand to Sit: with modified independence PT Goal: Stand to Sit - Progress: Progressing toward goal Pt will Stand: with modified independence PT Goal: Stand - Progress: Met Pt will Ambulate: >150 feet;with modified independence PT Goal: Ambulate - Progress: Progressing toward goal Pt will Go Up / Down Stairs: 3-5 stairs;with supervision;with rail(s) PT Goal: Up/Down Stairs - Progress: Progressing toward goal  Visit Information  Last PT Received On: 05/24/12 Assistance Needed: +1    Subjective Data      Cognition  Cognition Overall Cognitive Status: Appears within functional limits for tasks  assessed/performed Arousal/Alertness: Awake/alert Orientation Level: Appears intact for tasks assessed Behavior During Session: [facility] for tasks performed    Balance     End of Session PT - End of Session Equipment Utilized During Treatment: Gait belt;Back brace Activity Tolerance: Patient tolerated treatment well Patient left: in bed;with call bell/phone within reach;with family/visitor present Nurse Communication: Other (comment) (OK for d/c from PT standpoint)   GP     SASSER,LYNN 05/24/2012, 3:05 PM Pager [phone]

## 2012-05-24 NOTE — Progress Notes (Signed)
Right:  No evidence of DVT, superficial thrombosis, or Baker's cyst.  Left:  Negative for DVT in the common femoral vein.  

## 2012-05-24 NOTE — Progress Notes (Signed)
Occupational Therapy Treatment Patient Details Name: [name] MRN: [id number] DOB: 04-21-1932 Today's Date: 05/24/2012 Time: 9147-8295 OT Time Calculation (min): 30 min  OT Assessment / Plan / Recommendation Comments on Treatment Session pt. able to use a/e for lower body dressing and plans to purchase kit in the gift shop.  able to safely demonstrate toileting hygiene while maintaining back precautions.    Follow Up Recommendations  Home health OT;Supervision/Assistance - 24 hour    Barriers to Discharge       Equipment Recommendations  3 in 1 bedside comode    Recommendations for Other Services    Frequency Min 2X/week   Plan Discharge plan remains appropriate    Precautions / Restrictions Precautions Precautions: Back;Fall Precaution Booklet Issued: Yes (comment) Precaution Comments: able to recall 3/3 with demonstration, later in session able to recall 3/3 without any cueing required Required Braces or Orthoses: Spinal Brace Restrictions Weight Bearing Restrictions: No   Pertinent Vitals/Pain 3/10, rn gave meds at beginning of tx. session    ADL  Lower Body Dressing: Performed;Set up (used a/e) Where Assessed - Lower Body Dressing: Unsupported sitting Toileting - Clothing Manipulation and Hygiene: Performed;Independent Where Assessed - Toileting Clothing Manipulation and Hygiene: Sit on 3-in-1 or toilet Tub/Shower Transfer: Performed;Supervision/safety Tub/Shower Transfer Method: Science writer: Shower seat with back Equipment Used: Back brace;Long-handled shoe horn;Long-handled sponge;Reacher;Rolling walker;Sock aid Transfers/Ambulation Related to ADLs: s with rw in room and to b.room ADL Comments: able to reach for peri-care while seated on the 3n1 over commode while maintaining back precautions    OT Diagnosis:    OT Problem List:   OT Treatment Interventions:     OT Goals ADL Goals ADL Goal: Lower Body Bathing - Progress:  Progressing toward goals ADL Goal: Toileting - Hygiene - Progress: Met ADL Goal: Tub/Shower Transfer - Progress: Progressing toward goals  Visit Information  Last OT Received On: 05/24/12 Assistance Needed: +1    Subjective Data  Subjective: "my right leg has gone down a lot today"   Prior Functioning       Cognition  Cognition Overall Cognitive Status: Appears within functional limits for tasks assessed/performed Arousal/Alertness: Awake/alert Orientation Level: Appears intact for tasks assessed Behavior During Session: [facility] for tasks performed    Mobility  Bed Mobility Bed Mobility: Rolling Left;Left Sidelying to Sit;Sitting - Scoot to [place] of Bed;Sit to Sidelying Left Rolling Left: 5: Supervision Left Sidelying to Sit: 5: Supervision;HOB flat Sitting - Scoot to Edge of Bed: 5: Supervision Sit to Sidelying Left: 5: Supervision Details for Bed Mobility Assistance: cues for log roll sequencing, and encouragement for hob flat to simulate home environment Transfers Transfers: Sit to Stand;Stand to Sit Sit to Stand: 5: Supervision;With upper extremity assist;From bed;Other (comment) (cues to push up from bed vs. ues on walker) Stand to Sit: 5: Supervision;With upper extremity assist;With armrests;To elevated surface;To bed;To chair/3-in-1    Exercises      Balance     End of Session OT - End of Session Equipment Utilized During Treatment: Back brace Activity Tolerance: Patient tolerated treatment well Patient left: in bed;with call bell/phone within reach  GO     Robet Leu 05/24/2012, 10:22 AM

## 2012-05-24 NOTE — Discharge Summary (Signed)
Physician Discharge Summary  DISCHARGE SUMMARY   Patient ID: NEWTON FRUTIGER MR#: [id number] DOB/AGE: 1932/12/02 79 y.o.   Attending Physician:RUSSO,Kyrell M  Patient's WUJ:WJXBJ,YNWG M, MD  Consults:  none  Admit date: 05/21/2012 Discharge date: 05/24/2012  Discharge Diagnoses:  Principal Problem:   Vertebral fracture, osteoporotic   Patient Active Problem List  Diagnosis  . VENTRICULAR TACHYCARDIA  . Indigestion  . Ischemic cardiomyopathy  . Diverticulitis  . ICD -Medtronic  . Prostate cancer  . Osteoarthritis  . Cervical myelopathy  . Pulmonary embolism  . Hyperlipidemia  . GERD (gastroesophageal reflux disease)  . Esophageal stricture  . S/P aortic valve replacement  . Chronic cough  . Deformity, finger acquired  . Gait instability  . Atrial fibrillation, chronic  . Anticoagulated on Coumadin  . Vertebral fracture, osteoporotic   Past Medical History  Diagnosis Date  . Ischemic cardiomyopathy 05/2010    Has EF of 25%  . Diverticulitis     CURRENTLY CONTROLLED WITH NO EVIDENCE OF RECURRENT INFECTION  . AF (atrial fibrillation)     Has not tolerated amiodarone in the past. Amiodarone was stopped in September of 2010 due to side effects  . Chronic anticoagulation     on coumadin  . ICD (implantable cardiac defibrillator) in place   . Prostate cancer   . Osteoarthritis     RIGHT KNEE  . Aortic stenosis, severe     WITH PERCUTANEOUS AORTIC VALVE (TAVI) IN March 2012 at the [facility]  . MI (myocardial infarction) 1974, 68  . Cervical myelopathy   . Pulmonary embolism   . Hyperlipidemia   . GERD (gastroesophageal reflux disease)   . Esophageal stricture     WITH DILATATION  . NSVT (nonsustained ventricular tachycardia)   . Coronary artery disease   . Stroke   . Heart murmur   . Pacemaker     Discharged Condition: Fair   Discharge Medications:   Medication List    STOP taking these medications       multivitamin tablet      TAKE  these medications       acetaminophen 325 MG tablet  Commonly known as:  TYLENOL  Take 2 tablets (650 mg total) by mouth every 6 (six) hours as needed for pain or fever.     alfuzosin 10 MG 24 hr tablet  Commonly known as:  UROXATRAL  Take 10 mg by mouth at bedtime.     aspirin 81 MG chewable tablet  Chew 81 mg by mouth daily.     calcitonin (salmon) 200 UNIT/ACT nasal spray  Commonly known as:  MIACALCIN/FORTICAL  Place 1 spray into the nose daily. Alternate Nostrils QOD     carvedilol 12.5 MG tablet  Commonly known as:  COREG  Take 6.25 mg by mouth 2 (two) times daily with a meal.     furosemide 20 MG tablet  Commonly known as:  LASIX  Take 20 mg by mouth every 3 (three) days.     HYDROcodone-acetaminophen 5-325 MG per tablet  Commonly known as:  NORCO/VICODIN  Take 1 tablet by mouth every 6 (six) hours as needed.     levothyroxine 150 MCG tablet  Commonly known as:  SYNTHROID, LEVOTHROID  Take 1 tablet (150 mcg total) by mouth daily.     nitroGLYCERIN 0.4 MG SL tablet  Commonly known as:  NITROSTAT  Place 1 tablet (0.4 mg total) under the tongue every 5 (five) minutes as needed. For chest pain  nitroGLYCERIN 0.4 MG SL tablet  Commonly known as:  NITROSTAT  Place 0.4 mg under the tongue every 5 (five) minutes as needed for chest pain.     omeprazole 20 MG capsule  Commonly known as:  PRILOSEC  Take 20 mg by mouth daily as needed (acid reflux).     polyethylene glycol packet  Commonly known as:  MIRALAX / GLYCOLAX  Take 17 g by mouth daily as needed (constipation).     RESTASIS 0.05 % ophthalmic emulsion  Generic drug:  cycloSPORINE  Place 1 drop into both eyes every 12 (twelve) hours.     rosuvastatin 5 MG tablet  Commonly known as:  CRESTOR  Take 5 mg by mouth daily.     sildenafil 100 MG tablet  Commonly known as:  VIAGRA  Take 100 mg by mouth daily as needed for erectile dysfunction.     warfarin 5 MG tablet  Commonly known as:  COUMADIN  Take 5  mg by mouth every evening.        Hospital Procedures: No results found.  History of Present Illness: Caio is a pleasant 79yo Male with extensive past history. He was in [place] at a hotel. He was in the bedroom and fell. He fell on his bottom . The wind was knocked out of him. He had immediate pain in back. Initially the legs were numb and then the feeling came back. He was admitted to the hospital in [place]. While there he had an echo showing EF of 25-30%. He had some runs of svt. Cardiology saw him and no new recs were made. He was seen by [facility] as well and conservative measures of pain control and rehab were pursued as VP and or KP were felt not to be needed.  He worked with PT and it is felt he would need further rehab. He wanted transfer to [facility]. and he came back to [facility] via charter plane.  He did have a ct scan (no mri given his icd).  Pt transferred to me from [facility] for Traumatic Lumbar compression Fractures with Back pain, R hip pain and massive Hematoma, FTT, and deconditioning.  He still needed inpatient hospitalization due to need for IV pain meds, Hbg trending down and other issues.  In [place] he had been able to walk some with a walker. His CT scan also showed some spinal stenosis at L3-4 and L4-5. Prior to arrival he had some red blood from a hemorrhoid. Some constipation was dealt with  Via aggressive bowel prep leading to diarrhea. Reportedly he has two compression fractures, one at L1 to L4 although most of documentation referrs only to the one at L1.   Hospital Course: Pt transferred to me from [facility] for Traumatic Lumbar compression Fractures with Back pain, R hip pain and massive Hematoma, FTT, and deconditioning. He arrived 05/21/12 in better shape than expected.  He has not needed much IV Pain releif.  His Hbg has trended up from a low of 8.4. He was placed back on his coumadin and lasix.  He promptly diuresed well. Each day he has been beeling better and pain is  decreasing. Numbness is improving. He has been eating and drinking well. We initially thought he would need SNF and rehab and because he has done so well with therapy - he will be able to go home. He remainstiff and sore but now able to get up and move a little.   His foley was removed and he  has been able to void.  He has had a normal BM yesterday. He remains in great spirits and looks great. His R hip hematoma has extravasated down to below his knee with increased RLE Edema Less back spasm meds have been reconciled  L1 and L4 Compression Fractue - Traumatic and presumed osteoporotic - VP and KP on hold and not needed. Pain is controlled and rehab planned via [facility] PT/OT. Continue Ab Binder for support. Continue conservative treatment without vertebroplasty or kyphoplasty. He will need treatment for underlying osteoporosis. Spep pending. Vit D =31. Continue PT/OT/ and CSW and he is getting more functional daily. Some of R leg numbness may have more to do with Blood/Hematoma compression of Nerves. Continue Miacalcin NS. He stayed yesterday to help navigate 2 stairs that he will need to traverse to get into his house.  Spinal Stenosis - Pain control.  Anemia - Severe flank and hip bruising with hematoma. HD stable. If some of his bleeding is GI - His last Colon was 2009. NO GI W/up indicated at this time. Monitor Hbg And now remaining > 9. Great. 9.1 on 3/14. Chronic Anticoagulation due to AFib and H/O PE. INR was up to 3.7 in [place]. 2.0 - 2.3 - 1.98 - 2.03. Back on coumadin.  Will need INR Next week post Discharge. CAD/CABG twice/H/O MI - No current Angina. Cardiac Rehab as outpatient.  Afib on chronic anticoagulation - INR Goal 2-3. He is rate controlled.  VVI Pacer/ICD/H/O NSVT - Dr Graciela Husbands.  Tele = Afib c PVCs and a few small 5 beat runs. Ischemic CM c EF 25-30% - ECHO done in [place] was stable. Back on Lasix. Leg is swollen and no evidence Vol overload - probable more to do with Extravasation and  not DVT.  Severe AS S/P TAVI 05/2010 at [facility] - doing well.  Prostate CA S/P Seeds/XRT and BPH - Uroxatrol. Foley out and urinating well  Hypothyroidism on Rx  Constipation - Miralax fiber and Metamucil as outpatient. He has not had formed BM yet however he has had Bowel prep and lots of liq stools.  Chronic OA  GERD with H/O Stricture S/P Dilitation - Omeprazole.  TIA 11/2011  Hyperlipidemia - Only able to tolerate Crestor 5 QOD with less than perfect control. He did not tolerate Lipitor in past  DVT Prophylaxis - Via his coumadin Goal today is to continue support, pain control, increase activity, work with PT/OT/CW and follow labs - D/C today to home  Home Health Face to Face orders signed.  3 in 1 Bedside comode /Walker c 5 inch wheels all written for.  I am checking RLE Doppler and if (-) he can go home.     Day of Discharge Exam BP 106/68  Pulse 75  Temp(Src) 98.1 F (36.7 C) (Oral)  Resp 18  Ht 6\' 3"  (1.905 m)  Wt 97.6 kg (215 lb 2.7 oz)  BMI 26.89 kg/m2  SpO2 80%  Physical Exam: General appearance: A and O  Eyes: no scleral icterus  Throat: oropharynx moist without erythema  Resp: CTA B  Cardio: Irreg Irreg. Sternotomy. Pacer  GI: soft, non-tender; bowel sounds normal; no masses, no organomegaly  Extremities: no clubbing, cyanosis. 1+ E on R  R hip with massive Eccymoses and Bruise/Hematoma   Discharge Labs:  Recent Labs  05/23/12 0710 05/24/12 0502  NA 132* 135  K 4.2 4.3  CL 98 100  CO2 28 29  GLUCOSE 106* 98  BUN 12 10  CREATININE  0.92 0.85  CALCIUM 8.4 8.3*  MG 1.9 2.0    Recent Labs  05/23/12 0710 05/24/12 0502  AST 18 17  ALT 12 10  ALKPHOS 74 76  BILITOT 1.4* 1.4*  PROT 5.5* 5.4*  ALBUMIN 2.8* 2.8*    Recent Labs  05/23/12 0710 05/24/12 0502  WBC 8.2 7.8  NEUTROABS 5.8 5.5  HGB 9.2* 9.1*  HCT 26.5* 26.1*  MCV 88.9 90.0  PLT 236 259   No results found for this basename: CKTOTAL, CKMB, CKMBINDEX, TROPONINI,  in the  last 72 hours No results found for this basename: TSH, T4TOTAL, FREET3, T3FREE, THYROIDAB,  in the last 72 hours No results found for this basename: VITAMINB12, FOLATE, FERRITIN, TIBC, IRON, RETICCTPCT,  in the last 72 hours Lab Results  Component Value Date   INR 2.03* 05/24/2012   INR 1.98* 05/23/2012   INR 2.30* 05/22/2012       Discharge instructions:      Future Appointments Elasha Tess Department Dept Phone   05/28/2012 8:15 AM Mc-Cardiac Rehab Maintenance MOSES [facility] CARDIAC [facility] [phone]   05/29/2012 8:15 AM Mc-Cardiac Rehab Maintenance MOSES [facility] CARDIAC [facility] [phone]   05/30/2012 8:15 AM Mc-Cardiac Rehab Maintenance MOSES [facility] CARDIAC [facility] [phone]   06/04/2012 8:15 AM Mc-Cardiac Rehab Maintenance MOSES [facility] CARDIAC [facility] [phone]   06/05/2012 8:15 AM Mc-Cardiac Rehab Maintenance MOSES [facility] CARDIAC [facility] [phone]   06/06/2012 8:15 AM Mc-Cardiac Rehab Maintenance MOSES [facility] CARDIAC [facility] [phone]   06/11/2012 8:15 AM Mc-Cardiac Rehab Maintenance MOSES [facility] CARDIAC [facility] [phone]   06/12/2012 8:15 AM Mc-Cardiac Rehab Maintenance MOSES [facility] CARDIAC [facility] [phone]   06/13/2012 8:15 AM Mc-Cardiac Rehab Maintenance MOSES [facility] CARDIAC [facility] [phone]   06/18/2012 8:15 AM Mc-Cardiac Rehab Maintenance MOSES [facility] CARDIAC [facility] [phone]   06/19/2012 8:15 AM Mc-Cardiac Rehab Maintenance MOSES [facility] CARDIAC [facility] [phone]   06/20/2012 8:15 AM Mc-Cardiac Rehab Maintenance MOSES [facility] CARDIAC [facility] [phone]   06/25/2012 8:15 AM Mc-Cardiac Rehab Maintenance MOSES [facility] CARDIAC [facility] [phone]   06/26/2012 8:15 AM Mc-Cardiac Rehab Maintenance MOSES [facility] CARDIAC [facility] [phone]   06/27/2012 8:15 AM Mc-Cardiac Rehab  Maintenance MOSES [facility] CARDIAC [facility] [phone]   07/02/2012 8:15 AM Mc-Cardiac Rehab Maintenance MOSES [facility] CARDIAC [facility] [phone]   07/03/2012 8:15 AM Mc-Cardiac Rehab Maintenance MOSES [facility] CARDIAC [facility] [phone]   07/04/2012 8:15 AM Mc-Cardiac Rehab Maintenance MOSES [facility] CARDIAC [facility] [phone]   07/08/2012 9:15 AM [employer] Main Office [place]) [phone]   07/09/2012 8:15 AM Mc-Cardiac Rehab Maintenance MOSES [facility] CARDIAC [facility] [phone]   07/10/2012 8:15 AM Mc-Cardiac Rehab Maintenance MOSES [facility] CARDIAC [facility] [phone]   07/11/2012 8:15 AM Mc-Cardiac Rehab Maintenance MOSES [facility] CARDIAC [facility] [phone]   07/16/2012 8:15 AM Mc-Cardiac Rehab Maintenance MOSES [facility] CARDIAC [facility] [phone]   07/17/2012 8:15 AM Mc-Cardiac Rehab Maintenance MOSES [facility] CARDIAC [facility] [phone]   07/18/2012 8:15 AM Mc-Cardiac Rehab Maintenance MOSES [facility] CARDIAC [facility] [phone]   07/23/2012 8:15 AM Mc-Cardiac Rehab Maintenance MOSES [facility] CARDIAC [facility] [phone]   07/24/2012 8:15 AM Mc-Cardiac Rehab Maintenance MOSES [facility] CARDIAC [facility] [phone]   07/25/2012 8:15 AM Mc-Cardiac Rehab Maintenance MOSES [facility] CARDIAC [facility] [phone]   07/30/2012 8:15 AM Mc-Cardiac Rehab Maintenance  MOSES [facility] CARDIAC [facility] [phone]   07/31/2012 8:15 AM Mc-Cardiac Rehab Maintenance MOSES [facility] CARDIAC [facility] [phone]   07/31/2012 11:00 AM Tonny Bollman, MD [facility] Main Office [facility]) [phone]   08/01/2012 8:15 AM Mc-Cardiac Rehab Maintenance MOSES [facility] CARDIAC [facility] [phone]   08/06/2012 8:15 AM Mc-Cardiac Rehab Maintenance MOSES [facility] CARDIAC [facility]  [phone]   08/07/2012 8:15 AM Mc-Cardiac Rehab Maintenance MOSES [facility] CARDIAC [facility] [phone]   08/08/2012 8:15 AM Mc-Cardiac Rehab Maintenance MOSES [facility] CARDIAC [facility] [phone]   08/13/2012 8:15 AM Mc-Cardiac Rehab Maintenance MOSES [facility] CARDIAC [facility] [phone]   08/14/2012 8:15 AM Mc-Cardiac Rehab Maintenance MOSES [facility] CARDIAC [facility] [phone]   08/15/2012 8:15 AM Mc-Cardiac Rehab Maintenance MOSES [facility] CARDIAC [facility] [phone]   08/20/2012 8:15 AM Mc-Cardiac Rehab Maintenance MOSES [facility] CARDIAC [facility] [phone]   08/21/2012 8:15 AM Mc-Cardiac Rehab Maintenance MOSES [facility] CARDIAC [facility] [phone]   08/22/2012 8:15 AM Mc-Cardiac Rehab Maintenance MOSES [facility] CARDIAC [facility] [phone]   08/27/2012 8:15 AM Mc-Cardiac Rehab Maintenance MOSES [facility] CARDIAC [facility] [phone]   08/28/2012 8:15 AM Mc-Cardiac Rehab Maintenance MOSES [facility] CARDIAC [facility] [phone]   08/29/2012 8:15 AM Mc-Cardiac Rehab Maintenance MOSES [facility] CARDIAC [facility] [phone]   09/03/2012 8:15 AM Mc-Cardiac Rehab Maintenance MOSES [facility] CARDIAC [facility] [phone]   09/04/2012 8:15 AM Mc-Cardiac Rehab Maintenance MOSES [facility] CARDIAC [facility] [phone]   09/05/2012 8:15 AM Mc-Cardiac Rehab Maintenance MOSES [facility] CARDIAC [facility] [phone]   09/10/2012 8:15 AM Mc-Cardiac Rehab Maintenance MOSES [facility] CARDIAC [facility] [phone]   09/11/2012 8:15 AM Mc-Cardiac Rehab Maintenance MOSES [facility] CARDIAC [facility] [phone]   09/12/2012 8:15 AM Mc-Cardiac Rehab Maintenance MOSES [facility] CARDIAC [facility] [phone]   09/17/2012 8:15 AM Mc-Cardiac Rehab Maintenance MOSES [facility] CARDIAC [facility] [phone]   09/18/2012 8:15 AM Mc-Cardiac  Rehab Maintenance MOSES [facility] CARDIAC [facility] [phone]   09/19/2012 8:15 AM Mc-Cardiac Rehab Maintenance MOSES [facility] CARDIAC [facility] [phone]   09/24/2012 8:15 AM Mc-Cardiac Rehab Maintenance MOSES [facility] CARDIAC [facility] [phone]   09/25/2012 8:15 AM Mc-Cardiac Rehab Maintenance MOSES [facility] CARDIAC [facility] [phone]   09/26/2012 8:15 AM Mc-Cardiac Rehab Maintenance MOSES [facility] CARDIAC [facility] [phone]   10/01/2012 8:15 AM Mc-Cardiac Rehab Maintenance MOSES [facility] CARDIAC [facility] [phone]   10/02/2012 8:15 AM Mc-Cardiac Rehab Maintenance MOSES [facility] CARDIAC [facility] [phone]   10/03/2012 8:15 AM Mc-Cardiac Rehab Maintenance MOSES [facility] CARDIAC [facility] [phone]   10/08/2012 8:15 AM Mc-Cardiac Rehab Maintenance MOSES [facility] CARDIAC [facility] [phone]   10/09/2012 8:15 AM Mc-Cardiac Rehab Maintenance MOSES [facility] CARDIAC [facility] [phone]   10/10/2012 8:15 AM Mc-Cardiac Rehab Maintenance MOSES [facility] CARDIAC [name] [phone]   10/15/2012 8:15 AM Mc-Cardiac Rehab Maintenance MOSES [facility] CARDIAC [facility] [phone]   10/16/2012 8:15 AM Mc-Cardiac Rehab Maintenance MOSES [facility] CARDIAC [facility] [phone]   10/17/2012 8:15 AM Mc-Cardiac Rehab Maintenance MOSES [facility] CARDIAC [facility] [phone]   10/22/2012 8:15 AM Mc-Cardiac Rehab Maintenance MOSES [facility] CARDIAC [facility] [phone]   10/23/2012 8:15 AM Mc-Cardiac Rehab Maintenance MOSES [facility] CARDIAC [facility] [phone]   10/24/2012 8:15 AM Mc-Cardiac Rehab Maintenance MOSES [facility] CARDIAC [facility] [phone]   10/29/2012 8:15 AM Mc-Cardiac Rehab  Maintenance MOSES [facility] CARDIAC [facility] [phone]   10/30/2012 8:15 AM Mc-Cardiac Rehab Maintenance MOSES [facility] CARDIAC [facility] [phone]   10/31/2012 8:15 AM Mc-Cardiac Rehab Maintenance MOSES [facility] CARDIAC [facility] [phone]   11/05/2012 8:15 AM Mc-Cardiac Rehab Maintenance MOSES [name] CARDIAC [facility] [phone]   11/06/2012 8:15 AM Mc-Cardiac Rehab Maintenance MOSES [facility] CARDIAC [facility] [phone]   11/07/2012 8:15 AM Mc-Cardiac Rehab Maintenance MOSES [facility] CARDIAC [facility] [phone]   11/12/2012 8:15 AM Mc-Cardiac Rehab Maintenance MOSES [facility] CARDIAC [facility] [phone]   11/13/2012 8:15 AM Mc-Cardiac Rehab Maintenance MOSES [facility] CARDIAC [facility] [phone]   11/14/2012 8:15 AM Mc-Cardiac Rehab Maintenance MOSES [facility] CARDIAC [facility] [phone]   11/19/2012 8:15 AM Mc-Cardiac Rehab Maintenance MOSES [facility] CARDIAC [facility] [phone]   11/20/2012 8:15 AM Mc-Cardiac Rehab Maintenance MOSES [facility] CARDIAC [facility] [phone]   11/21/2012 8:15 AM Mc-Cardiac Rehab Maintenance MOSES [facility] CARDIAC [facility] [phone]   11/26/2012 8:15 AM Mc-Cardiac Rehab Maintenance MOSES [facility] CARDIAC [facility] [phone]   11/27/2012 8:15 AM Mc-Cardiac Rehab Maintenance MOSES [facility] CARDIAC [facility] [phone]   11/28/2012 8:15 AM Mc-Cardiac Rehab Maintenance MOSES [facility] CARDIAC [facility] [phone]   12/03/2012 8:15 AM Mc-Cardiac Rehab Maintenance MOSES [facility] CARDIAC [facility] [phone]   12/04/2012 8:15 AM Mc-Cardiac Rehab Maintenance MOSES [facility] CARDIAC [facility] [phone]   12/05/2012 8:15 AM Mc-Cardiac Rehab Maintenance MOSES [facility] CARDIAC [facility] [phone]   12/10/2012 8:15 AM Mc-Cardiac Rehab Maintenance MOSES [facility] CARD[facility] [phone]     01-Home or Self Care Follow-up Information   Follow up with Gwen Pounds, MD In 2 weeks.   Contact  information:   [address] [facility] [place] [address] [phone]        Disposition: home  Follow-up Appts: Follow-[facility] with Dr[facility] Timothy Lasso [facility] in 2 weeks.  Call for appointment.  Condition on Discharge: fair  Tests Needing Follow-up: SPEP, DEXA, BMET/CBC, PT/INR - He checks at home and calls into [facility] every Monday.  Time spent in discharge (includes decision making & examination of pt): 40 min  Signed: RUSSO,Jonnathan M 05/24/2012, 6:22 AM

## 2012-05-26 DIAGNOSIS — N390 Urinary tract infection, site not specified: Secondary | ICD-10-CM | POA: Diagnosis not present

## 2012-05-26 DIAGNOSIS — I4891 Unspecified atrial fibrillation: Secondary | ICD-10-CM | POA: Diagnosis not present

## 2012-05-26 DIAGNOSIS — Z5189 Encounter for other specified aftercare: Secondary | ICD-10-CM | POA: Diagnosis not present

## 2012-05-26 DIAGNOSIS — M1710 Unilateral primary osteoarthritis, unspecified knee: Secondary | ICD-10-CM | POA: Diagnosis not present

## 2012-05-26 DIAGNOSIS — IMO0002 Reserved for concepts with insufficient information to code with codable children: Secondary | ICD-10-CM | POA: Diagnosis not present

## 2012-05-26 DIAGNOSIS — Z9181 History of falling: Secondary | ICD-10-CM | POA: Diagnosis not present

## 2012-05-27 ENCOUNTER — Ambulatory Visit: Payer: Self-pay | Admitting: Cardiology

## 2012-05-27 LAB — POCT INR: INR: 4.5

## 2012-05-28 ENCOUNTER — Encounter (HOSPITAL_COMMUNITY): Payer: Medicare Other

## 2012-05-29 ENCOUNTER — Encounter (HOSPITAL_COMMUNITY): Payer: Self-pay | Admitting: *Deleted

## 2012-05-29 ENCOUNTER — Encounter (HOSPITAL_COMMUNITY): Payer: Medicare Other

## 2012-05-29 ENCOUNTER — Observation Stay (HOSPITAL_COMMUNITY)
Admission: EM | Admit: 2012-05-29 | Discharge: 2012-06-02 | Disposition: A | Discharge status: Home / Self Care | Payer: Medicare Other | Attending: Internal Medicine | Admitting: Internal Medicine

## 2012-05-29 DIAGNOSIS — M8448XA Pathological fracture, other site, initial encounter for fracture: Secondary | ICD-10-CM | POA: Diagnosis not present

## 2012-05-29 DIAGNOSIS — Z95810 Presence of automatic (implantable) cardiac defibrillator: Secondary | ICD-10-CM | POA: Insufficient documentation

## 2012-05-29 DIAGNOSIS — M25559 Pain in unspecified hip: Secondary | ICD-10-CM | POA: Diagnosis not present

## 2012-05-29 DIAGNOSIS — Z9181 History of falling: Secondary | ICD-10-CM | POA: Insufficient documentation

## 2012-05-29 DIAGNOSIS — D649 Anemia, unspecified: Secondary | ICD-10-CM | POA: Diagnosis not present

## 2012-05-29 DIAGNOSIS — S7000XA Contusion of unspecified hip, initial encounter: Secondary | ICD-10-CM | POA: Diagnosis not present

## 2012-05-29 DIAGNOSIS — I4891 Unspecified atrial fibrillation: Secondary | ICD-10-CM | POA: Insufficient documentation

## 2012-05-29 DIAGNOSIS — Z7901 Long term (current) use of anticoagulants: Secondary | ICD-10-CM | POA: Insufficient documentation

## 2012-05-29 DIAGNOSIS — Z951 Presence of aortocoronary bypass graft: Secondary | ICD-10-CM | POA: Insufficient documentation

## 2012-05-29 DIAGNOSIS — K625 Hemorrhage of anus and rectum: Secondary | ICD-10-CM | POA: Diagnosis not present

## 2012-05-29 DIAGNOSIS — M4800 Spinal stenosis, site unspecified: Secondary | ICD-10-CM | POA: Diagnosis not present

## 2012-05-29 DIAGNOSIS — M545 Low back pain, unspecified: Principal | ICD-10-CM | POA: Diagnosis present

## 2012-05-29 DIAGNOSIS — I959 Hypotension, unspecified: Secondary | ICD-10-CM | POA: Diagnosis not present

## 2012-05-29 DIAGNOSIS — I2589 Other forms of chronic ischemic heart disease: Secondary | ICD-10-CM | POA: Insufficient documentation

## 2012-05-29 DIAGNOSIS — S32000S Wedge compression fracture of unspecified lumbar vertebra, sequela: Secondary | ICD-10-CM

## 2012-05-29 DIAGNOSIS — R2681 Unsteadiness on feet: Secondary | ICD-10-CM | POA: Diagnosis present

## 2012-05-29 DIAGNOSIS — K649 Unspecified hemorrhoids: Secondary | ICD-10-CM | POA: Insufficient documentation

## 2012-05-29 DIAGNOSIS — R6889 Other general symptoms and signs: Secondary | ICD-10-CM | POA: Diagnosis not present

## 2012-05-29 DIAGNOSIS — K5900 Constipation, unspecified: Secondary | ICD-10-CM | POA: Insufficient documentation

## 2012-05-29 DIAGNOSIS — M810 Age-related osteoporosis without current pathological fracture: Secondary | ICD-10-CM | POA: Diagnosis not present

## 2012-05-29 DIAGNOSIS — E785 Hyperlipidemia, unspecified: Secondary | ICD-10-CM | POA: Diagnosis not present

## 2012-05-29 DIAGNOSIS — R269 Unspecified abnormalities of gait and mobility: Secondary | ICD-10-CM | POA: Diagnosis not present

## 2012-05-29 DIAGNOSIS — R627 Adult failure to thrive: Secondary | ICD-10-CM | POA: Diagnosis not present

## 2012-05-29 DIAGNOSIS — Z950 Presence of cardiac pacemaker: Secondary | ICD-10-CM | POA: Diagnosis not present

## 2012-05-29 DIAGNOSIS — M8008XA Age-related osteoporosis with current pathological fracture, vertebra(e), initial encounter for fracture: Secondary | ICD-10-CM

## 2012-05-29 DIAGNOSIS — W19XXXA Unspecified fall, initial encounter: Secondary | ICD-10-CM | POA: Insufficient documentation

## 2012-05-29 MED ORDER — ONDANSETRON HCL 4 MG/2ML IJ SOLN
4.0000 mg | Freq: Once | INTRAMUSCULAR | Status: AC
  Administered 2012-05-29: 4 mg via INTRAVENOUS
  Filled 2012-05-29: qty 2

## 2012-05-29 MED ORDER — DIAZEPAM 5 MG/ML IJ SOLN
2.5000 mg | Freq: Once | INTRAMUSCULAR | Status: AC
  Administered 2012-05-29: 2.5 mg via INTRAVENOUS
  Filled 2012-05-29: qty 2

## 2012-05-29 MED ORDER — MORPHINE SULFATE 2 MG/ML IJ SOLN
2.0000 mg | Freq: Once | INTRAMUSCULAR | Status: AC
  Administered 2012-05-29: 2 mg via INTRAVENOUS
  Filled 2012-05-29: qty 1

## 2012-05-29 NOTE — ED Notes (Signed)
Per EMS: 5 weeks ago pt slipped and fell and broke back.  Multiple fractures.  Physical therapy yesterday.  Woke up this AM feel stiff and pain.  This evening woke up twice to use bathroom and experienced twinged in back.  Pain radiates down right hip.  Took pain medication at 1930 (vicodin) and has not helped with pain.  Pt was d/c from this facility 4 days ago.

## 2012-05-29 NOTE — ED Notes (Signed)
EMS vitals 90 HR, 18 R, 148/90,

## 2012-05-29 NOTE — ED Notes (Addendum)
This evening patient experienced pain "a twing" as he was getting off the toilet after having a bowel movement.  He states that there was blood in his stools.  He has bruising from his right hip down to his calf.  He believes this may be due to his hemroids.

## 2012-05-29 NOTE — ED Provider Notes (Signed)
History     CSN: [id number]  Arrival date & time 05/29/12  2200   First MD Initiated Contact with Patient 05/29/12 2201      Chief Complaint  Patient presents with  . Hip Pain    (Consider location/radiation/quality/duration/timing/severity/associated sxs/prior treatment) HPI Comments: Patient brought to the ER by ambulance for evaluation of low back pain. Patient was hospitalized for a prolonged period of time secondary to multiple compression fractures in his back. Patient reports that he fell while he was vacationing in [place]. He ultimately was transferred to [facility] for continued care and discharged 4 days ago. Patient says he was doing well at home but had a vigorous physical therapy session this morning. He had lots of aches and pains after the physical therapy session and then this evening had severe worsening of his pain after getting up from sitting on the toilet.  Patient reports a bowel movement and there was some bleeding associated with the bowel movement. Patient does report that this frequently happens secondary to his hemorrhoids. He does, however, report that he had more bleeding tonight that he has had in the past. INR was 2.5 earlier today. He is on Coumadin because of a history of atrial fibrillation.  Patient is a 79 y.o. male presenting with hip pain.  Hip Pain Pertinent negatives include no abdominal pain.    Past Medical History  Diagnosis Date  . Ischemic cardiomyopathy 05/2010    Has EF of 25%  . Diverticulitis     CURRENTLY CONTROLLED WITH NO EVIDENCE OF RECURRENT INFECTION  . AF (atrial fibrillation)     Has not tolerated amiodarone in the past. Amiodarone was stopped in September of 2010 due to side effects  . Chronic anticoagulation     on coumadin  . ICD (implantable cardiac defibrillator) in place   . Prostate cancer   . Osteoarthritis     RIGHT KNEE  . Aortic stenosis, severe     WITH PERCUTANEOUS AORTIC VALVE (TAVI) IN March 2012 at the  [facility]  . MI (myocardial infarction) 1974, 45  . Cervical myelopathy   . Pulmonary embolism   . Hyperlipidemia   . GERD (gastroesophageal reflux disease)   . Esophageal stricture     WITH DILATATION  . NSVT (nonsustained ventricular tachycardia)   . Coronary artery disease   . Stroke   . Heart murmur   . Pacemaker     Past Surgical History  Procedure Laterality Date  . Icd  Feb 2003  . Coronary artery bypass graft  1979  . Coronary artery bypass graft  1991    REDO SURGERY  . Cardiac catheterization  2010    SEVERE LV DYSFUNCTION WITH ESTIMATED EJECTION FRACTION OF 25%  . Cholecystectomy    . Aortic valve replacement      Percutaneous AVR in March 2012 at the [facility]  . Cardioversion  02/16/2011    Procedure: CARDIOVERSION;  Surgeon: Luis Abed, MD;  Location: [facility] OR;  Service: Cardiovascular;  Laterality: N/A;  . Shoulder surgery    . Pacemaker placement      Family History  Problem Relation Age of Onset  . Heart disease Mother 41  . Diabetes Mother 18  . Heart disease Father 11    History  Substance Use Topics  . Smoking status: Former Smoker -- 4.00 packs/day for 15 years    Types: Cigarettes    Quit date: 06/15/1969  . Smokeless tobacco: Never Used  . Alcohol Use:  No      Review of Systems  Constitutional: Negative for fever.  Gastrointestinal: Positive for anal bleeding. Negative for abdominal pain.  Musculoskeletal: Positive for back pain.  All other systems reviewed and are negative.    Allergies  Penicillins; Ace inhibitors; Antihistamines, diphenhydramine-type; and Amiodarone  Home Medications   Current Outpatient Rx  Name  Route  Sig  Dispense  Refill  . acetaminophen (TYLENOL) 325 MG tablet   Oral   Take 650 mg by mouth every 6 (six) hours as needed for pain or fever.         Marland Kitchen alfuzosin (UROXATRAL) 10 MG 24 hr tablet   Oral   Take 10 mg by mouth at bedtime.          Marland Kitchen aspirin 81 MG chewable tablet    Oral   Chew 81 mg by mouth daily.          . calcitonin, salmon, (MIACALCIN/FORTICAL) 200 UNIT/ACT nasal spray   Nasal   Place 1 spray into the nose daily. Alternate nostrils every other day.         . carvedilol (COREG) 12.5 MG tablet   Oral   Take 6.25 mg by mouth 2 (two) times daily with a meal.          . cephALEXin (KEFLEX) 500 MG capsule   Oral   Take 500 mg by mouth 3 (three) times daily.         . furosemide (LASIX) 20 MG tablet   Oral   Take 20 mg by mouth every 3 (three) days.         Marland Kitchen HYDROcodone-acetaminophen (NORCO/VICODIN) 5-325 MG per tablet   Oral   Take 1 tablet by mouth every 6 (six) hours as needed for pain.         Marland Kitchen levothyroxine (SYNTHROID, LEVOTHROID) 150 MCG tablet   Oral   Take 1 tablet (150 mcg total) by mouth daily.   90 tablet   1   . omeprazole (PRILOSEC) 20 MG capsule   Oral   Take 20 mg by mouth daily as needed (acid reflux).         . ondansetron (ZOFRAN) 4 MG tablet   Oral   Take 4 mg by mouth every 8 (eight) hours as needed for nausea.         . polyethylene glycol (MIRALAX / GLYCOLAX) packet   Oral   Take 17 g by mouth daily as needed (constipation).         . RESTASIS 0.05 % ophthalmic emulsion   Both Eyes   Place 1 drop into both eyes every 12 (twelve) hours.          . rosuvastatin (CRESTOR) 5 MG tablet   Oral   Take 5 mg by mouth daily.          Marland Kitchen warfarin (COUMADIN) 5 MG tablet   Oral   Take 5 mg by mouth every evening.         . nitroGLYCERIN (NITROSTAT) 0.4 MG SL tablet   Sublingual   Place 0.4 mg under the tongue every 5 (five) minutes x 3 doses as needed for chest pain. For chest pain         . sildenafil (VIAGRA) 100 MG tablet   Oral   Take 100 mg by mouth daily as needed for erectile dysfunction.           BP 85/47  Pulse 83  Temp(Src) 98 F (36.7 C) (  Oral)  Resp 13  SpO2 94%  Physical Exam  Constitutional: He is oriented to person, place, and time. He appears  well-developed and well-nourished. No distress.  HENT:  Head: Normocephalic and atraumatic.  Right Ear: Hearing normal.  Nose: Nose normal.  Mouth/Throat: Oropharynx is clear and moist and mucous membranes are normal.  Eyes: Conjunctivae and EOM are normal. Pupils are equal, round, and reactive to light.  Neck: Normal range of motion. Neck supple.  Cardiovascular: Normal rate, regular rhythm, S1 normal and S2 normal.  Exam reveals no gallop and no friction rub.   No murmur heard. Pulmonary/Chest: Effort normal and breath sounds normal. No respiratory distress. He exhibits no tenderness.  Abdominal: Soft. Normal appearance and bowel sounds are normal. There is no hepatosplenomegaly. There is no tenderness. There is no rebound, no guarding, no tenderness at McBurney's point and negative Murphy's sign. No hernia.  Musculoskeletal: Normal range of motion.  Neurological: He is alert and oriented to person, place, and time. He has normal strength. No cranial nerve deficit or sensory deficit. Coordination normal. GCS eye subscore is 4. GCS verbal subscore is 5. GCS motor subscore is 6.  Skin: Skin is warm, dry and intact. No rash noted. No cyanosis.     Psychiatric: He has a normal mood and affect. His speech is normal and behavior is normal. Thought content normal.    ED Course  Procedures (including critical care time)  Labs Reviewed - No data to display No results found.   Diagnosis: 1. Back pain secondary to multiple compression fractures 2. Rectal bleeding on anticoagulation    MDM  Patient comes to the ER for evaluation of low back pain. Patient has had a recent extensive hospitalization for multiple compression fractures from a fall. He went home 4 days ago and has been receiving physical therapy at home. Patient reports that he had significant increased pain after physical therapy today. He appear to be having severe spasms of the lower back. He has had some minor improvement with  morphine and Valium, but still having significant pain with movement.  Patient also reports rectal bleeding. She reports a previous history of hemorrhoids. Patient is on anticoagulation for atrial fibrillation. Vital signs are stable. He is afebrile with normal blood pressure 121/71.  Shoulder require repeat hospitalization for uncontrolled back pain secondary to multiple level compression fractures in his back.        Gilda Crease, MD 05/30/12 [phone]

## 2012-05-30 ENCOUNTER — Inpatient Hospital Stay (HOSPITAL_COMMUNITY): Payer: Medicare Other

## 2012-05-30 ENCOUNTER — Encounter (HOSPITAL_COMMUNITY): Payer: Medicare Other

## 2012-05-30 ENCOUNTER — Encounter (HOSPITAL_COMMUNITY): Payer: Self-pay | Admitting: Internal Medicine

## 2012-05-30 DIAGNOSIS — S32009A Unspecified fracture of unspecified lumbar vertebra, initial encounter for closed fracture: Secondary | ICD-10-CM | POA: Diagnosis not present

## 2012-05-30 DIAGNOSIS — M8448XA Pathological fracture, other site, initial encounter for fracture: Secondary | ICD-10-CM | POA: Diagnosis not present

## 2012-05-30 DIAGNOSIS — M545 Low back pain: Secondary | ICD-10-CM | POA: Diagnosis present

## 2012-05-30 DIAGNOSIS — R110 Nausea: Secondary | ICD-10-CM | POA: Diagnosis not present

## 2012-05-30 DIAGNOSIS — K5900 Constipation, unspecified: Secondary | ICD-10-CM | POA: Diagnosis not present

## 2012-05-30 DIAGNOSIS — I4891 Unspecified atrial fibrillation: Secondary | ICD-10-CM | POA: Diagnosis not present

## 2012-05-30 DIAGNOSIS — M25559 Pain in unspecified hip: Secondary | ICD-10-CM | POA: Diagnosis not present

## 2012-05-30 LAB — CBC WITH DIFFERENTIAL/PLATELET
Basophils Absolute: 0 10*3/uL (ref 0.0–0.1)
HCT: 30.4 % — ABNORMAL LOW (ref 39.0–52.0)
Lymphocytes Relative: 6 % — ABNORMAL LOW (ref 12–46)
Neutro Abs: 12.6 10*3/uL — ABNORMAL HIGH (ref 1.7–7.7)
Platelets: 313 10*3/uL (ref 150–400)
RDW: 15.5 % (ref 11.5–15.5)
WBC: 14.9 10*3/uL — ABNORMAL HIGH (ref 4.0–10.5)

## 2012-05-30 LAB — PROTIME-INR
INR: 2.48 — ABNORMAL HIGH (ref 0.00–1.49)
INR: 2.69 — ABNORMAL HIGH (ref 0.00–1.49)
Prothrombin Time: 27.3 seconds — ABNORMAL HIGH (ref 11.6–15.2)

## 2012-05-30 LAB — URINALYSIS, ROUTINE W REFLEX MICROSCOPIC
Glucose, UA: NEGATIVE mg/dL
Ketones, ur: NEGATIVE mg/dL
Leukocytes, UA: NEGATIVE
Nitrite: NEGATIVE
Protein, ur: NEGATIVE mg/dL

## 2012-05-30 LAB — CBC
MCH: 31.3 pg (ref 26.0–34.0)
MCHC: 34.4 g/dL (ref 30.0–36.0)
RDW: 15.7 % — ABNORMAL HIGH (ref 11.5–15.5)

## 2012-05-30 LAB — COMPREHENSIVE METABOLIC PANEL
ALT: 16 U/L (ref 0–53)
AST: 22 U/L (ref 0–37)
Alkaline Phosphatase: 131 U/L — ABNORMAL HIGH (ref 39–117)
CO2: 24 mEq/L (ref 19–32)
Chloride: 98 mEq/L (ref 96–112)
GFR calc Af Amer: >90 mL/min (ref >90)
GFR calc non Af Amer: 79 mL/min — ABNORMAL LOW (ref >90)
Glucose, Bld: 103 mg/dL — ABNORMAL HIGH (ref 70–99)
Potassium: 4.4 mEq/L (ref 3.5–5.1)
Sodium: 132 mEq/L — ABNORMAL LOW (ref 135–145)

## 2012-05-30 LAB — BASIC METABOLIC PANEL
CO2: 24 mEq/L (ref 19–32)
Chloride: 98 mEq/L (ref 96–112)
Potassium: 4.3 mEq/L (ref 3.5–5.1)
Sodium: 131 mEq/L — ABNORMAL LOW (ref 135–145)

## 2012-05-30 MED ORDER — ZOLPIDEM TARTRATE 5 MG PO TABS: 5.0000 mg | ORAL_TABLET | Freq: Every evening | ORAL | Status: DC | PRN

## 2012-05-30 MED ORDER — HYDROCODONE-ACETAMINOPHEN 5-325 MG PO TABS
1.0000 | ORAL_TABLET | ORAL | Status: DC | PRN
  Administered 2012-05-30 – 2012-06-02 (×8): 1 via ORAL
  Filled 2012-05-30 (×9): qty 1

## 2012-05-30 MED ORDER — CALCITONIN (SALMON) 200 UNIT/ACT NA SOLN
1.0000 | Freq: Every day | NASAL | Status: DC
  Administered 2012-05-30 – 2012-06-02 (×4): 1 via NASAL
  Filled 2012-05-30: qty 3.7

## 2012-05-30 MED ORDER — WARFARIN SODIUM 5 MG PO TABS: 5.0000 mg | ORAL_TABLET | Freq: Every evening | ORAL | Status: DC

## 2012-05-30 MED ORDER — POLYETHYLENE GLYCOL 3350 17 G PO PACK: 17.0000 g | PACK | Freq: Every day | ORAL | Status: DC | PRN

## 2012-05-30 MED ORDER — NITROGLYCERIN 0.4 MG SL SUBL: 0.4000 mg | SUBLINGUAL_TABLET | SUBLINGUAL | Status: DC | PRN

## 2012-05-30 MED ORDER — CYCLOSPORINE 0.05 % OP EMUL
1.0000 [drp] | Freq: Two times a day (BID) | OPHTHALMIC | Status: DC
  Administered 2012-05-30 – 2012-06-02 (×7): 1 [drp] via OPHTHALMIC
  Filled 2012-05-30 (×10): qty 1

## 2012-05-30 MED ORDER — PANTOPRAZOLE SODIUM 40 MG PO TBEC
40.0000 mg | DELAYED_RELEASE_TABLET | Freq: Every day | ORAL | Status: DC
  Administered 2012-05-30 – 2012-06-02 (×4): 40 mg via ORAL
  Filled 2012-05-30 (×4): qty 1

## 2012-05-30 MED ORDER — ALUM & MAG HYDROXIDE-SIMETH 200-200-20 MG/5ML PO SUSP: 30.0000 mL | Freq: Four times a day (QID) | ORAL | Status: DC | PRN

## 2012-05-30 MED ORDER — CYCLOBENZAPRINE HCL 10 MG PO TABS
5.0000 mg | ORAL_TABLET | Freq: Three times a day (TID) | ORAL | Status: DC | PRN
  Administered 2012-05-31 – 2012-06-01 (×3): 5 mg via ORAL
  Filled 2012-05-30 (×3): qty 1

## 2012-05-30 MED ORDER — ACETAMINOPHEN 325 MG PO TABS
650.0000 mg | ORAL_TABLET | ORAL | Status: DC | PRN
Start: 2012-05-30 — End: 2012-06-02
  Administered 2012-06-02: 325 mg via ORAL
  Filled 2012-05-30: qty 1

## 2012-05-30 MED ORDER — ROSUVASTATIN CALCIUM 5 MG PO TABS
5.0000 mg | ORAL_TABLET | Freq: Every day | ORAL | Status: DC
  Administered 2012-05-30 – 2012-06-01 (×3): 5 mg via ORAL
  Filled 2012-05-30 (×4): qty 1

## 2012-05-30 MED ORDER — POTASSIUM CHLORIDE IN NACL 20-0.9 MEQ/L-% IV SOLN
INTRAVENOUS | Status: DC
  Administered 2012-05-30: 04:00:00 via INTRAVENOUS
  Filled 2012-05-30 (×2): qty 1000

## 2012-05-30 MED ORDER — ALFUZOSIN HCL ER 10 MG PO TB24
10.0000 mg | ORAL_TABLET | Freq: Every day | ORAL | Status: DC
  Administered 2012-05-30 – 2012-06-01 (×4): 10 mg via ORAL
  Filled 2012-05-30 (×5): qty 1

## 2012-05-30 MED ORDER — CARVEDILOL 6.25 MG PO TABS
6.2500 mg | ORAL_TABLET | Freq: Two times a day (BID) | ORAL | Status: DC
  Administered 2012-05-30 – 2012-05-31 (×3): 6.25 mg via ORAL
  Filled 2012-05-30 (×9): qty 1

## 2012-05-30 MED ORDER — ATORVASTATIN CALCIUM 20 MG PO TABS
20.0000 mg | ORAL_TABLET | Freq: Every day | ORAL | Status: DC
  Filled 2012-05-30: qty 1

## 2012-05-30 MED ORDER — LEVOTHYROXINE SODIUM 150 MCG PO TABS
150.0000 ug | ORAL_TABLET | Freq: Every day | ORAL | Status: DC
  Administered 2012-05-30 – 2012-06-02 (×4): 150 ug via ORAL
  Filled 2012-05-30 (×5): qty 1

## 2012-05-30 MED ORDER — DIAZEPAM 5 MG/ML IJ SOLN
2.5000 mg | Freq: Once | INTRAMUSCULAR | Status: AC
  Administered 2012-05-30: 2.5 mg via INTRAVENOUS
  Filled 2012-05-30: qty 2

## 2012-05-30 MED ORDER — WARFARIN SODIUM 5 MG PO TABS
5.0000 mg | ORAL_TABLET | Freq: Once | ORAL | Status: AC
  Administered 2012-05-30: 5 mg via ORAL
  Filled 2012-05-30: qty 1

## 2012-05-30 MED ORDER — PROMETHAZINE HCL 12.5 MG PO TABS
12.5000 mg | ORAL_TABLET | Freq: Four times a day (QID) | ORAL | Status: DC | PRN
  Administered 2012-05-30: 12.5 mg via ORAL
  Filled 2012-05-30 (×2): qty 1

## 2012-05-30 MED ORDER — NON FORMULARY: 5.0000 mg | Freq: Every day | Status: DC

## 2012-05-30 MED ORDER — WARFARIN - PHARMACIST DOSING INPATIENT: Freq: Every day | Status: DC

## 2012-05-30 MED ORDER — BISACODYL 5 MG PO TBEC: 5.0000 mg | DELAYED_RELEASE_TABLET | Freq: Every day | ORAL | Status: DC | PRN

## 2012-05-30 NOTE — Progress Notes (Signed)
Utilization Review Completed.   Sativa Gelles, RN, BSN Nurse Case Manager  [phone]  

## 2012-05-30 NOTE — H&P (Addendum)
[name] is an 79 y.o. male.   PCP:   Gwen Pounds, MD   Chief Complaint:   Back pain spasm and FTT   HPI:  [name] is a pleasant 79yo Male with extensive past history. He was in [place] at a hotel when he fell and sufferred a L1 Compression fracture, pain, AFTT, Leg Numbness, and R Hip/Flak hematoma with low Hbg. While there he had an echo showing EF of 25-30%. He had some runs of svt. Cardiology saw him and no new recs were made. He was seen by [facility] and conservative measures c pain controlwas recommended and not to do VP and or KP.  He improved with PT/OT and eventually was life flighted to [place] to complete his hospital stay.  His Hbg was stable here and trended up. He walked with a walker better daily. His CT scan also showed some spinal stenosis at L3-4 and L4-5. Prior to arrival he had some red blood from a hemorrhoid. Some constipation was dealt with Via aggressive bowel prep. He arrived 05/21/12  to me from [facility] for Traumatic Lumbar compression Fractures with Back pain, R hip pain and massive Hematoma, FTT, and deconditioning. He was placed back on his coumadin and lasix and we continued conservative management.  As soon as it was felt safe to D/c and he could do stairs we d/cec him home. His function improved daily and his numbness was better.  His Hbg on D/c was 9.1 on 3/14.  He had a foley during the hospital stay and it was removed quickly.  After he got home he had Sxs of a UTI and Macrodantin was called in.  This made him Nauseated and it was changed to Keflex.  Zofran was used for the nausea.  He worked with PT Tuesday and on Wednesday he was more stiff and had more pain.  He went to have a BM and when he stood up experienced twinged in back c pain radiating down right hip. Took pain medication at 1930 (vicodin) which did not help with pain.  He also had blood in stools which he attributed to hemorrhoids.  Last GI W/up was 2009 with colonoscopy.  He is on Coumadin because of a  history of atrial fibrillation.  He presented to the ED yesterday via EMS for the back pain, Spasm, Radiculopathy = Diagnosis: 1. Back pain secondary to multiple compression fractures 2. Rectal bleeding on anticoagulation Patient comes to the ER for evaluation of low back pain. ER thought that he appears to be having severe spasms of the lower back. He has had some minor improvement with morphine and Valium, but still having significant pain with movement prompting the need for inpt admission.  Currently feeling better than last night except for nausea and anorexia.      Past Medical History:  Past Medical History  Diagnosis Date  . Ischemic cardiomyopathy 05/2010    Has EF of 25%  . Diverticulitis     CURRENTLY CONTROLLED WITH NO EVIDENCE OF RECURRENT INFECTION  . AF (atrial fibrillation)     Has not tolerated amiodarone in the past. Amiodarone was stopped in September of 2010 due to side effects  . Chronic anticoagulation     on coumadin  . ICD (implantable cardiac defibrillator) in place   . Prostate cancer   . Osteoarthritis     RIGHT KNEE  . Aortic stenosis, severe     WITH PERCUTANEOUS AORTIC VALVE (TAVI) IN March 2012 at the [facility]  . MI (myocardial infarction) 1974, 15  . Cervical myelopathy   . Pulmonary embolism   . Hyperlipidemia   . GERD (gastroesophageal reflux disease)   . Esophageal stricture     WITH DILATATION  . NSVT (nonsustained ventricular tachycardia)   . Coronary artery disease   . Stroke   . Heart murmur   . Pacemaker     Past Surgical History  Procedure Laterality Date  . Icd  Feb 2003  . Coronary artery bypass graft  1979  . Coronary artery bypass graft  1991    REDO SURGERY  . Cardiac catheterization  2010    SEVERE LV DYSFUNCTION WITH ESTIMATED EJECTION FRACTION OF 25%  . Cholecystectomy    . Aortic valve replacement      Percutaneous AVR in March 2012 at the [facility]  . Cardioversion  02/16/2011    Procedure:  CARDIOVERSION;  Surgeon: Luis Abed, MD;  Location: [facility] OR;  Service: Cardiovascular;  Laterality: N/A;  . Shoulder surgery    . Pacemaker placement        Allergies:   Allergies  Allergen Reactions  . Penicillins Shortness Of Breath and Rash  . Ace Inhibitors     Has not tolerated in the past due to hyperkalemia  . Antihistamines, Diphenhydramine-Type Other (See Comments)    Inhibits urination  . Amiodarone      Medications: Prior to Admission medications   Medication Sig Start Date End Date Taking? Authorizing Provider  acetaminophen (TYLENOL) 325 MG tablet Take 650 mg by mouth every 6 (six) hours as needed for pain or fever. 05/24/12  Yes Gwen Pounds, MD  alfuzosin (UROXATRAL) 10 MG 24 hr tablet Take 10 mg by mouth at bedtime.    Yes Historical Provider, MD  aspirin 81 MG chewable tablet Chew 81 mg by mouth daily.    Yes Historical Provider, MD  calcitonin, salmon, (MIACALCIN/FORTICAL) 200 UNIT/ACT nasal spray Place 1 spray into the nose daily. Alternate nostrils every other day. 05/24/12  Yes Gwen Pounds, MD  carvedilol (COREG) 12.5 MG tablet Take 6.25 mg by mouth 2 (two) times daily with a meal.    Yes Historical Provider, MD  cephALEXin (KEFLEX) 500 MG capsule Take 500 mg by mouth 3 (three) times daily.   Yes Historical Provider, MD  furosemide (LASIX) 20 MG tablet Take 20 mg by mouth every 3 (three) days.   Yes Historical Provider, MD  HYDROcodone-acetaminophen (NORCO/VICODIN) 5-325 MG per tablet Take 1 tablet by mouth every 6 (six) hours as needed for pain. 05/24/12  Yes Gwen Pounds, MD  levothyroxine (SYNTHROID, LEVOTHROID) 150 MCG tablet Take 1 tablet (150 mcg total) by mouth daily. 02/09/12  Yes Herby Abraham, MD  omeprazole (PRILOSEC) 20 MG capsule Take 20 mg by mouth daily as needed (acid reflux).   Yes Historical Provider, MD  ondansetron (ZOFRAN) 4 MG tablet Take 4 mg by mouth every 8 (eight) hours as needed for nausea.   Yes Historical Provider, MD   polyethylene glycol (MIRALAX / GLYCOLAX) packet Take 17 g by mouth daily as needed (constipation).   Yes Historical Provider, MD  RESTASIS 0.05 % ophthalmic emulsion Place 1 drop into both eyes every 12 (twelve) hours.  12/05/11  Yes Historical Provider, MD  rosuvastatin (CRESTOR) 5 MG tablet Take 5 mg by mouth daily.    Yes Historical Provider, MD  warfarin (COUMADIN) 5 MG tablet Take 5 mg by mouth every evening.   Yes Historical Provider, MD  nitroGLYCERIN (NITROSTAT) 0.4 MG SL tablet Place 0.4 mg under the tongue every 5 (five) minutes x 3 doses as needed for chest pain. For chest pain 05/24/12   Gwen Pounds, MD  sildenafil (VIAGRA) 100 MG tablet Take 100 mg by mouth daily as needed for erectile dysfunction.    Historical Provider, MD     Medications Prior to Admission  Medication Sig Dispense Refill  . acetaminophen (TYLENOL) 325 MG tablet Take 650 mg by mouth every 6 (six) hours as needed for pain or fever.      Marland Kitchen alfuzosin (UROXATRAL) 10 MG 24 hr tablet Take 10 mg by mouth at bedtime.       Marland Kitchen aspirin 81 MG chewable tablet Chew 81 mg by mouth daily.       . calcitonin, salmon, (MIACALCIN/FORTICAL) 200 UNIT/ACT nasal spray Place 1 spray into the nose daily. Alternate nostrils every other day.      . carvedilol (COREG) 12.5 MG tablet Take 6.25 mg by mouth 2 (two) times daily with a meal.       . cephALEXin (KEFLEX) 500 MG capsule Take 500 mg by mouth 3 (three) times daily.      . furosemide (LASIX) 20 MG tablet Take 20 mg by mouth every 3 (three) days.      Marland Kitchen HYDROcodone-acetaminophen (NORCO/VICODIN) 5-325 MG per tablet Take 1 tablet by mouth every 6 (six) hours as needed for pain.      Marland Kitchen levothyroxine (SYNTHROID, LEVOTHROID) 150 MCG tablet Take 1 tablet (150 mcg total) by mouth daily.  90 tablet  1  . omeprazole (PRILOSEC) 20 MG capsule Take 20 mg by mouth daily as needed (acid reflux).      . ondansetron (ZOFRAN) 4 MG tablet Take 4 mg by mouth every 8 (eight) hours as needed for nausea.       . polyethylene glycol (MIRALAX / GLYCOLAX) packet Take 17 g by mouth daily as needed (constipation).      . RESTASIS 0.05 % ophthalmic emulsion Place 1 drop into both eyes every 12 (twelve) hours.       . rosuvastatin (CRESTOR) 5 MG tablet Take 5 mg by mouth daily.       Marland Kitchen warfarin (COUMADIN) 5 MG tablet Take 5 mg by mouth every evening.      . nitroGLYCERIN (NITROSTAT) 0.4 MG SL tablet Place 0.4 mg under the tongue every 5 (five) minutes x 3 doses as needed for chest pain. For chest pain      . sildenafil (VIAGRA) 100 MG tablet Take 100 mg by mouth daily as needed for erectile dysfunction.         Social History:  reports that he quit smoking about 42 years ago. His smoking use included Cigarettes. He has a 60 pack-year smoking history. He has never used smokeless tobacco. He reports that he does not drink alcohol or use illicit drugs.  Family History: Family History  Problem Relation Age of Onset  . Heart disease Mother 24  . Diabetes Mother 42  . Heart disease Father 68    Review of Systems:  Review of Systems - Full ROS obtained. No Dysuria. No CP or SOB. Leg pains a little min spasm All other ROS (-)  Physical Exam:  Blood pressure 114/82, pulse 95, temperature 98.7 F (37.1 C), temperature source Oral, resp. rate 18, SpO2 97.00%. Filed Vitals:   05/30/12 0100 05/30/12 0130 05/30/12 0158 05/30/12 0640  BP: 113/57 103/74 116/83 114/82  Pulse: 97 80 98  95  Temp:   97.7 F (36.5 C) 98.7 F (37.1 C)  TempSrc:   Oral Oral  Resp: 18 14 18 18   SpO2: 98% 97% 98% 97%   General appearance: A and O Head: Normocephalic, without obvious abnormality, atraumatic Eyes: conjunctivae/corneas clear. PERRL, EOM's intact.  Nose: Nares normal. Septum midline. Mucosa normal. No drainage or sinus tenderness. Throat: lips, mucosa, and tongue normal; teeth and gums normal Neck: no adenopathy, no carotid bruit, no JVD and thyroid not enlarged, symmetric, no tenderness/mass/nodules Resp:  CTA Cardio: irreg, Pacer/AICD/m GI: soft, non-tender; bowel sounds normal; no masses,  no organomegaly Rectal - massive Hemmorhoid noted. Extremities: R Hip massive hematoma but improving and extravascated to ankle.  No E.  Moving leg better Pulses: 2+ and symmetric Lymph nodes:  no cervical lymphadenopathy Neurologic: Alert and oriented X 3, good strength and tone. Normal symmetric reflexes.     Labs on Admission:   Recent Labs  05/29/12 2355 05/30/12 0625  NA 131* 132*  K 4.3 4.4  CL 98 98  CO2 24 24  GLUCOSE 111* 103*  BUN 18 17  CREATININE 0.96 0.92  CALCIUM 8.3* 8.5    Recent Labs  05/30/12 0625  AST 22  ALT 16  ALKPHOS 131*  BILITOT 1.5*  PROT 5.6*  ALBUMIN 3.0*   No results found for this basename: LIPASE, AMYLASE,  in the last 72 hours  Recent Labs  05/29/12 2355 05/30/12 0625  WBC 14.9* 10.7*  NEUTROABS 12.6*  --   HGB 10.1* 9.8*  HCT 30.4* 28.5*  MCV 93.8 91.1  PLT 313 289   No results found for this basename: CKTOTAL, CKMB, CKMBINDEX, TROPONINI,  in the last 72 hours Lab Results  Component Value Date   INR 2.48* 05/30/2012   INR 2.69* 05/29/2012   INR 4.5 05/27/2012     LAB RESULT POCT:  Results for orders placed during the hospital encounter of 05/29/12  CBC WITH DIFFERENTIAL      Result Value Range   WBC 14.9 (*) 4.0 - 10.5 K/uL   RBC 3.24 (*) 4.22 - 5.81 MIL/uL   Hemoglobin 10.1 (*) 13.0 - 17.0 g/dL   HCT 13.0 (*) 86.5 - 78.4 %   MCV 93.8  78.0 - 100.0 fL   MCH 31.2  26.0 - 34.0 pg   MCHC 33.2  30.0 - 36.0 g/dL   RDW 69.6  29.5 - 28.4 %   Platelets 313  150 - 400 K/uL   Neutrophils Relative 84 (*) 43 - 77 %   Neutro Abs 12.6 (*) 1.7 - 7.7 K/uL   Lymphocytes Relative 6 (*) 12 - 46 %   Lymphs Abs 0.9  0.7 - 4.0 K/uL   Monocytes Relative 7  3 - 12 %   Monocytes Absolute 1.0  0.1 - 1.0 K/uL   Eosinophils Relative 4  0 - 5 %   Eosinophils Absolute 0.5  0.0 - 0.7 K/uL   Basophils Relative 0  0 - 1 %   Basophils Absolute 0.0  0.0 -  0.1 K/uL  BASIC METABOLIC PANEL      Result Value Range   Sodium 131 (*) 135 - 145 mEq/L   Potassium 4.3  3.5 - 5.1 mEq/L   Chloride 98  96 - 112 mEq/L   CO2 24  19 - 32 mEq/L   Glucose, Bld 111 (*) 70 - 99 mg/dL   BUN 18  6 - 23 mg/dL   Creatinine, Ser 1.32  0.50 -  1.35 mg/dL   Calcium 8.3 (*) 8.4 - 10.5 mg/dL   GFR calc non Af Amer 77 (*) >90 mL/min   GFR calc Af Amer 90 (*) >90 mL/min  PROTIME-INR      Result Value Range   Prothrombin Time 27.3 (*) 11.6 - 15.2 seconds   INR 2.69 (*) 0.00 - 1.49  COMPREHENSIVE METABOLIC PANEL      Result Value Range   Sodium 132 (*) 135 - 145 mEq/L   Potassium 4.4  3.5 - 5.1 mEq/L   Chloride 98  96 - 112 mEq/L   CO2 24  19 - 32 mEq/L   Glucose, Bld 103 (*) 70 - 99 mg/dL   BUN 17  6 - 23 mg/dL   Creatinine, Ser 1.61  0.50 - 1.35 mg/dL   Calcium 8.5  8.4 - 09.6 mg/dL   Total Protein 5.6 (*) 6.0 - 8.3 g/dL   Albumin 3.0 (*) 3.5 - 5.2 g/dL   AST 22  0 - 37 U/L   ALT 16  0 - 53 U/L   Alkaline Phosphatase 131 (*) 39 - 117 U/L   Total Bilirubin 1.5 (*) 0.3 - 1.2 mg/dL   GFR calc non Af Amer 79 (*) >90 mL/min   GFR calc Af Amer >90  >90 mL/min  CBC      Result Value Range   WBC 10.7 (*) 4.0 - 10.5 K/uL   RBC 3.13 (*) 4.22 - 5.81 MIL/uL   Hemoglobin 9.8 (*) 13.0 - 17.0 g/dL   HCT 04.5 (*) 40.9 - 81.1 %   MCV 91.1  78.0 - 100.0 fL   MCH 31.3  26.0 - 34.0 pg   MCHC 34.4  30.0 - 36.0 g/dL   RDW 91.4 (*) 78.2 - 95.6 %   Platelets 289  150 - 400 K/uL  PROTIME-INR      Result Value Range   Prothrombin Time 25.7 (*) 11.6 - 15.2 seconds   INR 2.48 (*) 0.00 - 1.49      Radiological Exams on Admission: No results found.    Orders placed in visit on 04/29/12  . EKG 12-LEAD     Assessment/Plan Principal Problem:   Acute low back pain Active Problems:   Gait instability   Vertebral fracture, osteoporotic   Rectal bleed   Hypotension  L1 and L4 Compression Fractue - Traumatic/osteoporotic @ 2 weeks ago undergoing PT/OT at home and  making progress - continue Treatment. Pain control/Walker and Ab Binder for support. Continue conservative treatment without vertebroplasty or kyphoplasty.  Spep nonspecific s an M spike. Vit D =31.  Re-order PT/OT/ and CSW. Continue Miacalcin NS.   Spinal Stenosis - Pain control.   R Hip pain and spasms.  - Doubt any Fx - Will re-xray back and Hip and Order therapy.  Nausea related to narcotics, UTI and Abx - ?Constipation -  Antiemetics and time. Check KUB  Anemia and Rectal Bleeding (presumed hemorrhoidal) - Related to the hematoma. HD stable. His last Colon was 2009. NO GI W/up indicated at this time. Monitor Hbg And now remainingstable.  Discussed what it would take to do Colon now.   Chronic Anticoagulation due to AFib and H/O PE. INR Therapeutic.  UTI - S/P Macrodantin and Keflex - Repeating UA and if (-) we will stop Abx.  CAD/CABG twice/H/O MI - No current Angina. Cardiac Rehab as outpatient.  Afib on chronic anticoagulation - INR Goal 2-3. He is rate controlled.  VVI Pacer/ICD/H/O NSVT - Dr  Graciela Husbands. Ischemic CM c EF 25-30% - ECHO done in [place] was stable. Back on Lasix 1-3 times per week.  Severe AS S/P TAVI 05/2010 at [facility] - doing well.  Prostate CA S/P Seeds/XRT and BPH - Uroxatrol.  Hypothyroidism on Rx  Constipation - Having BMs. Chronic OA  GERD with H/O Stricture S/P Dilitation - Omeprazole.  TIA 11/2011  Hyperlipidemia - Only able to tolerate Crestor 5 QOD with less than perfect control. He did not tolerate Lipitor in past  DVT Prophylaxis - Via his coumadin   Goal today is to continue support, follow up on UA, Get back and hip x-rays, pain control, increase activity, work with PT/OT/CW and follow labs. Hep lock.  Add Muscle relaxor.  Labs reviewed and are fine - await UA.    Tyrah Broers,Ilay M 05/30/2012, 8:01 AM

## 2012-05-30 NOTE — Progress Notes (Signed)
Patient's wife was questioning the need for her husband to be on telemetry. On patient's previous admission he was placed on telemetry due to his extensive cardiac history. Spoke with Dr. Eloise Harman about the concern. He did not issue any orders to place patient on telemetry at this time.

## 2012-05-30 NOTE — Progress Notes (Signed)
SW will await PT evaluation.  Sabino Niemann, MSW, [phone]

## 2012-05-30 NOTE — Evaluation (Signed)
Physical Therapy Evaluation Patient Details Name: [name] MRN: [id number] DOB: Jun 15, 1932 Today's Date: 05/30/2012 Time: 4098-1191 PT Time Calculation (min): 28 min  PT Assessment / Plan / Recommendation Clinical Impression  Pt is a 79 y/o male who was d/c'd from hospital last week for back pain from 2 vertbral compression fractures.  Pt re-admitted yesterday with un-relenting pain in low back and posterior lateral Right hip.   Pt is tender to palpation of  right TFL especially at greater trochanter.  Applied gel ice pack to R TFL and instructed pt/RN to ice every 2 hours today and tomorrow with hopes of reducing edema and relieving pain.   Pt mobility limited by pain in back and hip.  Acute PT will continue to follow pt with plan to return to home with HHPT.      PT Assessment  Patient needs continued PT services    Follow Up Recommendations  Home health PT;Supervision/Assistance - 24 hour    Does the patient have the potential to tolerate intense rehabilitation      Barriers to Discharge        Equipment Recommendations  None recommended by PT    Recommendations for Other Services     Frequency Min 5X/week    Precautions / Restrictions Precautions Precautions: Back;Fall Precaution Booklet Issued: Yes (comment) Precaution Comments: able to recall 3/3 and adhered to these with mobility Required Braces or Orthoses: Spinal Brace Spinal Brace: Lumbar corset;Applied in sitting position Restrictions Weight Bearing Restrictions: No   Pertinent Vitals/Pain 8/10 pain in low back and R hip.  Applied ice to hip and repositions pt for back pain relief.        Mobility  Bed Mobility Bed Mobility: Rolling Left;Left Sidelying to Sit;Sit to Sidelying Left Rolling Left: 6: Modified independent (Device/Increase time);With rail Left Sidelying to Sit: 5: Supervision;HOB flat Supine to Sit: Not tested (comment) Sitting - Scoot to Edge of Bed: 4: Min assist Sit to Sidelying Left: 3:  Mod assist;HOB flat Details for Bed Mobility Assistance: assist to manage R LE secondary to pain with hip adduction.   Transfers Transfers: Sit to Stand;Stand to [employer] Transfers Sit to Stand: 4: Min guard;From bed;With upper extremity assist;From chair/3-in-1 Stand to Sit: 4: Min guard;To chair/3-in-1;To bed;With upper extremity assist Stand Pivot Transfers: 4: Min guard Details for Transfer Assistance: Min guard for safety.   Ambulation/Gait Ambulation/Gait Assistance: 4: Min guard Ambulation Distance (Feet): 5 Feet Assistive device: Rolling walker Ambulation/Gait Assistance Details: gait training limited secondary to pt fear of aggrevating his pain.   Gait velocity: decreased speed Stairs: No Stairs Assistance: Not tested (comment) Wheelchair Mobility Wheelchair Mobility: No    Exercises     PT Diagnosis: Difficulty walking;Acute pain  PT Problem List: Decreased activity tolerance;Decreased balance;Decreased mobility;Pain PT Treatment Interventions: DME instruction;Gait training;Stair training;Functional mobility training;Therapeutic activities;Therapeutic exercise;Balance training;Patient/family education   PT Goals Acute Rehab PT Goals PT Goal Formulation: With patient Time For Goal Achievement: 06/06/12 Potential to Achieve Goals: Good Pt will Roll Supine to Right Side: with modified independence PT Goal: Rolling Supine to Right Side - Progress: Goal set today Pt will Roll Supine to Left Side: with modified independence PT Goal: Rolling Supine to Left Side - Progress: Goal set today Pt will go Supine/Side to Sit: with modified independence PT Goal: Supine/Side to Sit - Progress: Goal set today Pt will Sit at Edge of Bed: with modified independence PT Goal: Sit at Edge Of Bed - Progress: Goal set today Pt will  go Sit to Supine/Side: with modified independence PT Goal: Sit to Supine/Side - Progress: Goal set today Pt will go Sit to Stand: with modified  independence PT Goal: Sit to Stand - Progress: Goal set today Pt will go Stand to Sit: with modified independence PT Goal: Stand to Sit - Progress: Goal set today PT Transfer Goal: Bed to Chair/Chair to Bed - Progress: Goal set today Pt will Stand: with modified independence PT Goal: Stand - Progress: Goal set today Pt will Ambulate: >150 feet;with modified independence PT Goal: Ambulate - Progress: Goal set today Pt will Go Up / Down Stairs: 3-5 stairs;with supervision;with rail(s) PT Goal: Up/Down Stairs - Progress: Goal set today  Visit Information  Last PT Received On: 05/30/12 Assistance Needed: +1    Subjective Data      Prior Functioning  Home Living Lives With: Spouse Available Help at Discharge: Family;Available 24 hours/day Type of Home: House Home Access: Stairs to enter [employer] of Steps: 2 Entrance Stairs-Rails: Right;Left;Can reach both Home Layout: Two level;Able to live on main level with bedroom/bathroom Bathroom Shower/Tub: Walk-in shower;Door [employer] Toilet: Standard Home Adaptive Equipment: Built-in shower seat;Grab bars in shower;Hand-held shower hose Prior Function Level of Independence: Independent Able to Take Stairs?: Yes Driving: Yes Vocation: Retired Musician: No difficulties Dominant Hand: Right    Cognition  Cognition Overall Cognitive Status: Appears within functional limits for tasks assessed/performed Arousal/Alertness: Awake/alert Orientation Level: Appears intact for tasks assessed Behavior During Session: [facility] for tasks performed Cognition - Other Comments: intact.    Extremity/Trunk Assessment Right Upper Extremity Assessment RUE ROM/Strength/Tone: WFL for tasks assessed Left Upper Extremity Assessment LUE ROM/Strength/Tone: WFL for tasks assessed Right Lower Extremity Assessment RLE ROM/Strength/Tone: Deficits;Due to pain RLE ROM/Strength/Tone Deficits: pt has limited ROM and strength in R hip  secondary to pain in proximal lateral hip.  Tender to palpation over R TFL.    Left Lower Extremity Assessment LLE ROM/Strength/Tone: Within functional levels   Balance Static Sitting Balance Static Sitting - Balance Support: No upper extremity supported Static Sitting - Level of Assistance: 7: Independent Static Sitting - Comment/# of Minutes: sat on EOB 2+ minutes with no LOB.    End of Session PT - End of Session Equipment Utilized During Treatment: Gait belt;Back brace Activity Tolerance: Patient limited by pain Patient left: in bed;with call bell/phone within reach;with family/visitor present Nurse Communication: Mobility status;Other (comment) (to apply ice to R hip every 2 hrs. )  GP     [name] 05/30/2012, 4:56 PM Mashayla Lavin L. Isabel Freese DPT [phone]

## 2012-05-30 NOTE — Progress Notes (Addendum)
ANTICOAGULATION CONSULT NOTE - Initial Consult  Pharmacy Consult for Coumadin Indication: atrial fibrillation  Allergies  Allergen Reactions  . Penicillins Shortness Of Breath and Rash  . Ace Inhibitors     Has not tolerated in the past due to hyperkalemia  . Antihistamines, Diphenhydramine-Type Other (See Comments)    Inhibits urination  . Amiodarone     Vital Signs: Temp: 97.7 F (36.5 C) (03/20 0158) Temp src: Oral (03/20 0158) BP: 116/83 mmHg (03/20 0158) Pulse Rate: 98 (03/20 0158)  Labs:  Recent Labs  05/29/12 2355  HGB 10.1*  HCT 30.4*  PLT 313  LABPROT 27.3*  INR 2.69*  CREATININE 0.96    Medical History: Past Medical History  Diagnosis Date  . Ischemic cardiomyopathy 05/2010    Has EF of 25%  . Diverticulitis     CURRENTLY CONTROLLED WITH NO EVIDENCE OF RECURRENT INFECTION  . AF (atrial fibrillation)     Has not tolerated amiodarone in the past. Amiodarone was stopped in September of 2010 due to side effects  . Chronic anticoagulation     on coumadin  . ICD (implantable cardiac defibrillator) in place   . Prostate cancer   . Osteoarthritis     RIGHT KNEE  . Aortic stenosis, severe     WITH PERCUTANEOUS AORTIC VALVE (TAVI) IN March 2012 at the [facility]  . MI (myocardial infarction) 1974, 82  . Cervical myelopathy   . Pulmonary embolism   . Hyperlipidemia   . GERD (gastroesophageal reflux disease)   . Esophageal stricture     WITH DILATATION  . NSVT (nonsustained ventricular tachycardia)   . Coronary artery disease   . Stroke   . Heart murmur   . Pacemaker     Medications:  Prescriptions prior to admission  Medication Sig Dispense Refill  . acetaminophen (TYLENOL) 325 MG tablet Take 650 mg by mouth every 6 (six) hours as needed for pain or fever.      Marland Kitchen alfuzosin (UROXATRAL) 10 MG 24 hr tablet Take 10 mg by mouth at bedtime.       Marland Kitchen aspirin 81 MG chewable tablet Chew 81 mg by mouth daily.       . calcitonin, salmon,  (MIACALCIN/FORTICAL) 200 UNIT/ACT nasal spray Place 1 spray into the nose daily. Alternate nostrils every other day.      . carvedilol (COREG) 12.5 MG tablet Take 6.25 mg by mouth 2 (two) times daily with a meal.       . cephALEXin (KEFLEX) 500 MG capsule Take 500 mg by mouth 3 (three) times daily.      . furosemide (LASIX) 20 MG tablet Take 20 mg by mouth every 3 (three) days.      Marland Kitchen HYDROcodone-acetaminophen (NORCO/VICODIN) 5-325 MG per tablet Take 1 tablet by mouth every 6 (six) hours as needed for pain.      Marland Kitchen levothyroxine (SYNTHROID, LEVOTHROID) 150 MCG tablet Take 1 tablet (150 mcg total) by mouth daily.  90 tablet  1  . omeprazole (PRILOSEC) 20 MG capsule Take 20 mg by mouth daily as needed (acid reflux).      . ondansetron (ZOFRAN) 4 MG tablet Take 4 mg by mouth every 8 (eight) hours as needed for nausea.      . polyethylene glycol (MIRALAX / GLYCOLAX) packet Take 17 g by mouth daily as needed (constipation).      . RESTASIS 0.05 % ophthalmic emulsion Place 1 drop into both eyes every 12 (twelve) hours.       Marland Kitchen  rosuvastatin (CRESTOR) 5 MG tablet Take 5 mg by mouth daily.       Marland Kitchen warfarin (COUMADIN) 5 MG tablet Take 5 mg by mouth every evening.      . nitroGLYCERIN (NITROSTAT) 0.4 MG SL tablet Place 0.4 mg under the tongue every 5 (five) minutes x 3 doses as needed for chest pain. For chest pain      . sildenafil (VIAGRA) 100 MG tablet Take 100 mg by mouth daily as needed for erectile dysfunction.       Scheduled:  . alfuzosin  10 mg Oral QHS  . atorvastatin  20 mg Oral q1800  . calcitonin (salmon)  1 spray Alternating Nares Daily  . carvedilol  6.25 mg Oral BID WC  . cycloSPORINE  1 drop Both Eyes BID  . [COMPLETED] diazepam  2.5 mg Intravenous Once  . levothyroxine  150 mcg Oral QAC breakfast  . [COMPLETED]  morphine injection  2 mg Intravenous Once  . [COMPLETED] ondansetron  4 mg Intravenous Once  . pantoprazole  40 mg Oral Daily  . Warfarin - Pharmacist Dosing Inpatient   Does  not apply q1800  . [DISCONTINUED] warfarin  5 mg Oral QPM    Assessment: 79yo male c/[place] with compression fractures, now home, c/o worsening back pain and blood in stool; per pt, he sometimes has blood in stools from hemorrhoids but this seemed like more blood; pt checks his INR with home monitor and input from cards, states INR was 2.5 today, 2.69 per [facility] lab, to continue Coumadin for Afib.  Goal of Therapy:  INR 2-3   Plan:  Will likely resume home Coumadin dose of 5mg  daily but will f/u with CBC and INR this am prior to ordering in case bleeding represents more than h/o hemorrhoids.  Vernard Gambles, PharmD, BCPS  05/30/2012,2:16 AM     ============================ Addendum - INR 2.48 - hgb relatively stable (9.8) - Son reported that he told his son the physician said the bleeding was from his hemorrhoid.   Plan - Coumadin 5mg  PO today as stated - Daily PT / INR - F/U bleeding     Cailyn Houdek D. Laney Potash, PharmD, BCPS Pager:  [phone] 05/30/2012, 1:27 PM

## 2012-05-30 NOTE — Progress Notes (Signed)
Pt. Does not meet Inpatient care.  Discussed this with Dr. Timothy Lasso, and obtained order for Observation status.  In to speak with pt. About change of status, as well as giving pt. Medicare Outpatient Information Sheet.  Pt. Signed sheet, as well as this NCM.  Placed copy of sheet in pt.'s shadow chart and gave pt. A copy as well.    Of note, family concerned about pt. Returning home.  Spouse is unable to assist pt. At home, and would like pt. To go to CIR.  Pt. Is currently active with [facility] for [name] PT/OT.  NCM will follow for discharge needs. Tera Mater, RN, BSN NCM [phone]

## 2012-05-31 DIAGNOSIS — M25559 Pain in unspecified hip: Secondary | ICD-10-CM | POA: Diagnosis not present

## 2012-05-31 DIAGNOSIS — K5900 Constipation, unspecified: Secondary | ICD-10-CM | POA: Diagnosis not present

## 2012-05-31 DIAGNOSIS — I4891 Unspecified atrial fibrillation: Secondary | ICD-10-CM | POA: Diagnosis not present

## 2012-05-31 DIAGNOSIS — S32009A Unspecified fracture of unspecified lumbar vertebra, initial encounter for closed fracture: Secondary | ICD-10-CM | POA: Diagnosis not present

## 2012-05-31 LAB — CBC
MCH: 31.2 pg (ref 26.0–34.0)
MCHC: 34 g/dL (ref 30.0–36.0)
MCV: 91.8 fL (ref 78.0–100.0)
Platelets: 278 10*3/uL (ref 150–400)
RBC: 2.92 MIL/uL — ABNORMAL LOW (ref 4.22–5.81)
RDW: 16.1 % — ABNORMAL HIGH (ref 11.5–15.5)

## 2012-05-31 LAB — URINE CULTURE: Special Requests: NORMAL

## 2012-05-31 LAB — BASIC METABOLIC PANEL
CO2: 26 mEq/L (ref 19–32)
Calcium: 8.6 mg/dL (ref 8.4–10.5)
Creatinine, Ser: 1.04 mg/dL (ref 0.50–1.35)
GFR calc Af Amer: 77 mL/min — ABNORMAL LOW (ref >90)
GFR calc non Af Amer: 67 mL/min — ABNORMAL LOW (ref >90)
Sodium: 131 mEq/L — ABNORMAL LOW (ref 135–145)

## 2012-05-31 LAB — PROTIME-INR: Prothrombin Time: 24.7 seconds — ABNORMAL HIGH (ref 11.6–15.2)

## 2012-05-31 MED ORDER — HYDROCORTISONE ACETATE 25 MG RE SUPP
25.0000 mg | Freq: Once | RECTAL | Status: AC
  Administered 2012-05-31: 25 mg via RECTAL
  Filled 2012-05-31: qty 1

## 2012-05-31 MED ORDER — HYDROCORTISONE 0.5 % EX CREA
TOPICAL_CREAM | Freq: Two times a day (BID) | CUTANEOUS | Status: DC
  Administered 2012-05-31 – 2012-06-02 (×4): via TOPICAL
  Filled 2012-05-31: qty 28.35

## 2012-05-31 MED ORDER — LIDOCAINE 5 % EX PTCH
1.0000 | MEDICATED_PATCH | CUTANEOUS | Status: DC
  Administered 2012-05-31 – 2012-06-02 (×3): 1 via TRANSDERMAL
  Filled 2012-05-31 (×3): qty 1

## 2012-05-31 MED ORDER — WARFARIN SODIUM 5 MG PO TABS
5.0000 mg | ORAL_TABLET | Freq: Once | ORAL | Status: AC
  Administered 2012-05-31: 5 mg via ORAL
  Filled 2012-05-31: qty 1

## 2012-05-31 NOTE — Clinical Social Work Psychosocial (Signed)
Clinical Social Work Department  BRIEF PSYCHOSOCIAL ASSESSMENT  Patient: SHERLEY LESER Account Number: [id number]  Admit date: 05/29/12 Clinical Social Worker Sabino Niemann, MSW Date/Time: 05/31/2012 930 am  Referred by: Physician Date Referred: 05/31/2012 Referred for   SNF Placement   Other Referral:  Interview type: Patient  Other interview type: PSYCHOSOCIAL DATA  Living Status: wife Admitted from facility:  Level of care:  Primary support name: Dickman,Jo  Primary support relationship to patient: Spouse Degree of support available:  Strong and vested  CURRENT CONCERNS  Current Concerns   Post-Acute Placement   Other Concerns:  SOCIAL WORK ASSESSMENT / PLAN  CSW met with patient to discuss privately paying at a SNF. Patient reported that at this time he plans to get stronger and go home with home health. Clinical Social Worker will sign off for now as social work intervention is no longer needed. Please consult [place] again if new need arises.             Assessment/plan status: Information/Referral to [facility]  Other assessment/ plan:  Information/referral to community resources:  None Noted     PATIENT'S/FAMILY'S RESPONSE TO PLAN OF CARE:  Pt  cannot privately pay for a facility at this time and is requesting to return home with home health. Clinical Social Worker will sign off for now as social work intervention is no longer needed. Please consult [place] again if new need arises.   Sabino Niemann, MSW [phone]

## 2012-05-31 NOTE — Care Management Note (Signed)
CARE MANAGEMENT NOTE 05/31/2012 Comments:  05/31/12 4:50pm CM has spoken with patient multiple times today concening admission status. Patient and wife state they are filing an appeal with medicare. Spoke with Dr. Jacky Kindle. Dr. Timothy Lasso not available. Wife states that they are active with [facility]. CM provided patient and wife with private duty agency list as well as SNF list if they are interested in privately paying.Also notified Child psychotherapist.

## 2012-05-31 NOTE — Progress Notes (Signed)
Subjective: Admitted yesterday with nause, anorexia, Anemia, hemorrhoidal bleeding, recent UTI and Abx use, Recurrent LBP and R hip Spasm.  meds adjusted.  No UTI on UA.  Off Abx.   X rays (-) for anything new or acute.  Hbg now 9.1 but that is what he was on D/C on 3/14.  He has a new Knot on R Hamstring/hip area that is tender and felt better with ice.  He sat up in chair and likes the chairs here.     Objective: Vital signs in last 24 hours: Temp:  [98.2 F (36.8 C)-98.7 F (37.1 C)] 98.7 F (37.1 C) (03/21 0645) Pulse Rate:  [72-87] 72 (03/21 0810) Resp:  [16-18] 18 (03/21 0645) BP: (86-135)/(52-74) 105/60 mmHg (03/21 0810) SpO2:  [95 %-99 %] 99 % (03/21 0645) Weight change:  Last BM Date: 05/29/12  CBG (last 3)  No results found for this basename: GLUCAP,  in the last 72 hours  Intake/Output from previous day:  Intake/Output Summary (Last 24 hours) at 05/31/12 0858 Last data filed at 05/31/12 0805  Gross per 24 hour  Intake    120 ml  Output    400 ml  Net   -280 ml   03/20 0701 - 03/21 0700 In: 0  Out: 400 [Urine:400]   Physical Exam General appearance: A + O Eyes: no scleral icterus Throat: oropharynx moist without erythema Resp: CTA Cardio: Irreg, Pacer, m GI: soft, non-tender; bowel sounds normal; no masses,  no organomegaly Extremities: R hip decent ROM today, however new Knot and new Bruise noted intermixed with old hematoma.   Lab Results:  Recent Labs  05/30/12 0625 05/31/12 0621  NA 132* 131*  K 4.4 4.4  CL 98 97  CO2 24 26  GLUCOSE 103* 96  BUN 17 14  CREATININE 0.92 1.04  CALCIUM 8.5 8.6     Recent Labs  05/30/12 0625  AST 22  ALT 16  ALKPHOS 131*  BILITOT 1.5*  PROT 5.6*  ALBUMIN 3.0*     Recent Labs  05/29/12 2355 05/30/12 0625 05/31/12 0621  WBC 14.9* 10.7* 8.6  NEUTROABS 12.6*  --   --   HGB 10.1* 9.8* 9.1*  HCT 30.4* 28.5* 26.8*  MCV 93.8 91.1 91.8  PLT 313 289 278    Lab Results  Component Value Date   INR  2.35* 05/31/2012   INR 2.48* 05/30/2012   INR 2.69* 05/29/2012    No results found for this basename: CKTOTAL, CKMB, CKMBINDEX, TROPONINI,  in the last 72 hours  No results found for this basename: TSH, T4TOTAL, FREET3, T3FREE, THYROIDAB,  in the last 72 hours  No results found for this basename: VITAMINB12, FOLATE, FERRITIN, TIBC, IRON, RETICCTPCT,  in the last 72 hours  Micro Results: Recent Results (from the past 240 hour(s))  URINE CULTURE     Status: None   Collection Time    05/30/12  3:42 AM      Result Value Range Status   Specimen Description URINE, CLEAN CATCH   Final   Special Requests Normal   Final   Culture  Setup Time 05/30/2012 04:35   Final   Colony Count NO GROWTH   Final   Culture NO GROWTH   Final   Report Status 05/31/2012 FINAL   Final     Studies/Results: Dg Lumbar Spine 2-3 Views  05/30/2012  *RADIOLOGY REPORT*  Clinical Data: Compression fracture.  Pain.  LUMBAR SPINE - 2-3 VIEW  Comparison: Chest CT 02/17/2012.  Abdominal and pelvic CT 03/19/2009.  Findings: Anterior wedge compression fracture of the L1 vertebral body involving the superior plate with 82% loss of height and slight retropulsion of the posterior-superior aspect.  This is age indeterminate, possibly acute and  new  compared to the 2011 CT.  Schmorl's node deformity superior plate L4 new since prior 2011 CT.  L2-3 moderate posterior disc space narrowing with mild retrolisthesis L2.  Facet joint degenerative changes L3-4 through L5-S1.  Vascular calcifications.  IMPRESSION: Compression fracture L1 vertebra and Schmorl's node deformity superior endplate L4 as detailed above.  This is a call report.   Original Report Authenticated By: Lacy Duverney, M.D.    Dg Hip Complete Right  05/30/2012  *RADIOLOGY REPORT*  Clinical Data: Right hip pain.  RIGHT HIP - COMPLETE 2+ VIEW  Comparison: None.  Findings: No fracture or dislocation.  No significant hip joint degenerative changes.  No plain film evidence of  femoral head avascular necrosis.  Vascular calcifications.  IMPRESSION: No acute abnormality.  Please see above.   Original Report Authenticated By: Lacy Duverney, M.D.    Dg Abd 1 View  05/30/2012  *RADIOLOGY REPORT*  Clinical Data: Nausea.  ABDOMEN - 1 VIEW  Comparison: 09/10/2006.  Findings: Progressive degenerative changes lower thoracic spine and upper lumbar spine with suggestion of loss of height of the L1 vertebral body incompletely assessed on the AP view.  Surgical clips right upper quadrant.  Nonspecific bowel gas pattern without plain film evidence of bowel obstruction.  The possibility of free intraperitoneal air cannot be addressed on a supine view.  Vascular calcifications.  IMPRESSION: No plain film evidence of bowel obstruction.  Please see above.   Original Report Authenticated By: Lacy Duverney, M.D.      Medications: Scheduled: . alfuzosin  10 mg Oral QHS  . calcitonin (salmon)  1 spray Alternating Nares Daily  . carvedilol  6.25 mg Oral BID WC  . cycloSPORINE  1 drop Both Eyes BID  . levothyroxine  150 mcg Oral QAC breakfast  . pantoprazole  40 mg Oral Daily  . rosuvastatin  5 mg Oral q1800  . Warfarin - Pharmacist Dosing Inpatient   Does not apply q1800   Continuous:    Assessment/Plan: Principal Problem:   Acute low back pain Active Problems:   Gait instability   Vertebral fracture, osteoporotic   Rectal bleed   Hypotension  L1 and L4 Compression Fractue - Traumatic/osteoporotic @ 2 weeks ago undergoing PT/OT at home and making progress - continue PT/OT here.  He is not ready for D/C yet. Continue working on pain Administrator, sports and Ab Binder for support. Continue conservative treatment without vertebroplasty or kyphoplasty. Continue Miacalcin NS.  Spinal Stenosis - Pain control.  R Hip pain and spasms. - X rays (-).  Turning out to have a new Bruise/Hematoma interspersed in his old and this is probably the source of his setback. Nausea improving. Anemia and  Rectal Bleeding (presumed hemorrhoidal) - Related to the hematomas. HD stable. His last Colon was 2009. NO GI W/up indicated at this time. Monitor Hbg And now remaining stable.  Chronic Anticoagulation due to AFib and H/O PE. INR Therapeutic.  UTI - S/P Macrodantin and Keflex - Repeating UA was (-).  CAD/CABG twice/H/O MI - No current Angina. Cardiac Rehab as outpatient.  Afib on chronic anticoagulation - INR Goal 2-3. He is rate controlled.  VVI Pacer/ICD/H/O NSVT - Dr Graciela Husbands.  Ischemic CM c EF 25-30% - ECHO done in [place] was stable. Back  on Lasix 1-3 times per week.  Severe AS S/P TAVI 05/2010 at [name] - doing well.  Prostate CA S/P Seeds/XRT and BPH - Uroxatrol.  Hypothyroidism on Rx  Constipation - Needs Bowel prep again for BMs.  Chronic OA  GERD with H/O Stricture S/P Dilitation - Omeprazole.  TIA 11/2011  Hyperlipidemia - Only able to tolerate Crestor 5 QOD with less than perfect control. He did not tolerate Lipitor in past  DVT Prophylaxis - Via his coumadin Hemorrhoid - HC cream and suppositories.  No straining.   Goal today is to continue support, pain control, ice, Muscle relaxor, lidoderm, Rx Hemorrhoids, increase activity, work with PT/OT/CW and follow labs. Muscle relaxor. He may be ready for D/C on Sat or Sunday.     DVT Prophylaxis    LOS: 2 days   [place]re Alexa,Klever M 05/31/2012, 8:58 AM

## 2012-05-31 NOTE — Progress Notes (Signed)
ANTICOAGULATION CONSULT NOTE - Follow up Consult  Pharmacy Consult for Coumadin Indication: atrial fibrillation  Allergies  Allergen Reactions  . Penicillins Shortness Of Breath and Rash  . Ace Inhibitors     Has not tolerated in the past due to hyperkalemia  . Antihistamines, Diphenhydramine-Type Other (See Comments)    Inhibits urination  . Amiodarone     Vital Signs: Temp: 98.7 F (37.1 C) (03/21 0645) BP: 105/60 mmHg (03/21 0810) Pulse Rate: 72 (03/21 0810)  Labs:  Recent Labs  05/29/12 2355 05/30/12 0625 05/31/12 0621  HGB 10.1* 9.8* 9.1*  HCT 30.4* 28.5* 26.8*  PLT 313 289 278  LABPROT 27.3* 25.7* 24.7*  INR 2.69* 2.48* 2.35*  CREATININE 0.96 0.92 1.04    Medical History: Past Medical History  Diagnosis Date  . Ischemic cardiomyopathy 05/2010    Has EF of 25%  . Diverticulitis     CURRENTLY CONTROLLED WITH NO EVIDENCE OF RECURRENT INFECTION  . AF (atrial fibrillation)     Has not tolerated amiodarone in the past. Amiodarone was stopped in September of 2010 due to side effects  . Chronic anticoagulation     on coumadin  . ICD (implantable cardiac defibrillator) in place   . Prostate cancer   . Osteoarthritis     RIGHT KNEE  . Aortic stenosis, severe     WITH PERCUTANEOUS AORTIC VALVE (TAVI) IN March 2012 at the [facility]  . MI (myocardial infarction) 1974, 71  . Cervical myelopathy   . Pulmonary embolism   . Hyperlipidemia   . GERD (gastroesophageal reflux disease)   . Esophageal stricture     WITH DILATATION  . NSVT (nonsustained ventricular tachycardia)   . Coronary artery disease   . Stroke   . Heart murmur   . Pacemaker     Medications:  Prescriptions prior to admission  Medication Sig Dispense Refill  . acetaminophen (TYLENOL) 325 MG tablet Take 650 mg by mouth every 6 (six) hours as needed for pain or fever.      Marland Kitchen alfuzosin (UROXATRAL) 10 MG 24 hr tablet Take 10 mg by mouth at bedtime.       Marland Kitchen aspirin 81 MG chewable tablet  Chew 81 mg by mouth daily.       . calcitonin, salmon, (MIACALCIN/FORTICAL) 200 UNIT/ACT nasal spray Place 1 spray into the nose daily. Alternate nostrils every other day.      . carvedilol (COREG) 12.5 MG tablet Take 6.25 mg by mouth 2 (two) times daily with a meal.       . cephALEXin (KEFLEX) 500 MG capsule Take 500 mg by mouth 3 (three) times daily.      . furosemide (LASIX) 20 MG tablet Take 20 mg by mouth every 3 (three) days.      Marland Kitchen HYDROcodone-acetaminophen (NORCO/VICODIN) 5-325 MG per tablet Take 1 tablet by mouth every 6 (six) hours as needed for pain.      Marland Kitchen levothyroxine (SYNTHROID, LEVOTHROID) 150 MCG tablet Take 1 tablet (150 mcg total) by mouth daily.  90 tablet  1  . omeprazole (PRILOSEC) 20 MG capsule Take 20 mg by mouth daily as needed (acid reflux).      . ondansetron (ZOFRAN) 4 MG tablet Take 4 mg by mouth every 8 (eight) hours as needed for nausea.      . polyethylene glycol (MIRALAX / GLYCOLAX) packet Take 17 g by mouth daily as needed (constipation).      . RESTASIS 0.05 % ophthalmic emulsion Place 1 drop  into both eyes every 12 (twelve) hours.       . rosuvastatin (CRESTOR) 5 MG tablet Take 5 mg by mouth daily.       Marland Kitchen warfarin (COUMADIN) 5 MG tablet Take 5 mg by mouth every evening.      . nitroGLYCERIN (NITROSTAT) 0.4 MG SL tablet Place 0.4 mg under the tongue every 5 (five) minutes x 3 doses as needed for chest pain. For chest pain      . sildenafil (VIAGRA) 100 MG tablet Take 100 mg by mouth daily as needed for erectile dysfunction.       Scheduled:  . alfuzosin  10 mg Oral QHS  . calcitonin (salmon)  1 spray Alternating Nares Daily  . carvedilol  6.25 mg Oral BID WC  . cycloSPORINE  1 drop Both Eyes BID  . [COMPLETED] hydrocortisone  25 mg Rectal Once  . hydrocortisone cream   Topical BID  . levothyroxine  150 mcg Oral QAC breakfast  . lidocaine  1 patch Transdermal Q24H  . pantoprazole  40 mg Oral Daily  . rosuvastatin  5 mg Oral q1800  . [COMPLETED] warfarin   5 mg Oral ONCE-1800  . Warfarin - Pharmacist Dosing Inpatient   Does not apply q1800    Assessment: 79yo male c/o recent discharged 05/24/12 from [facility] after [place]ransfered from an [place] hospital s/p fall that resulted in traumatic lumbar compression fractures and R hip/flak hematoma; Readmitted yesterday due to c/o worsening back pain and blood in stool; per pt, he sometimes has blood in stools from hemorrhoids but this seemed like more blood; pt checks his INR with home monitor and input from cards, states INR was 2.5 yesterday; 2.69 per [name] lab.  Admit INR = 2.49. He is to continue on  Coumadin for Afib. Dr. Timothy Lasso notes rectal bleeding is presumed hemorrhoidal.  On admit ED nurse noted pt to have bruising from rt hip down to calf secondary to his prior fall. No other bleeding noted.   Today the INR = 2.35, remains in therapeutic range; down slightly from 2.69 on 3/19. His home dose is 5mg  daily.  Patient reported he took lower dose of 2.5mg  on 05/29/12. His usual 5mg  dose was given yesterday per pharmacy protocol.   Goal of Therapy:  INR 2-3   Plan:  Coumadin 5 mg po today x1 INR qAM  Noah Delaine, RPh Clinical Pharmacist Pager: [phone] 05/31/2012,12:50 PM

## 2012-05-31 NOTE — Evaluation (Signed)
Occupational Therapy Evaluation Patient Details Name: [name] MRN: [id number] DOB: 1932-12-13 Today's Date: 05/31/2012 Time: 4098-1191 OT Time Calculation (min): 16 min  OT Assessment / Plan / Recommendation Clinical Impression  This 79 yo male admitted with back pain/spasm  and R hip pain from hematoma as well as FTT, presents to acute OT with problems below. He was D/c'd less than 1 week ago due to compression fxs from fall while in [place]. Will benefit from acute OT with follow up HHOT.    OT Assessment  Patient needs continued OT Services    Follow Up Recommendations  Home health OT;Supervision/Assistance - 24 hour    Barriers to Discharge None          Frequency  Min 2X/week    Precautions / Restrictions Precautions Precautions: Fall;Back Precaution Booklet Issued: Yes (comment) Precaution Comments: able to recall 3/3 and adhered to these with mobility Required Braces or Orthoses: Spinal Brace Spinal Brace: Lumbar corset;Applied in sitting position Restrictions Weight Bearing Restrictions: No   Pertinent Vitals/Pain Right hip pain, ice pack, repositioned    ADL  Eating/Feeding: Independent Where Assessed - Eating/Feeding: Chair Grooming: Simulated;Set up;Supervision/safety Where Assessed - Grooming: Unsupported sitting Upper Body Bathing: Simulated;Set up;Supervision/safety Where Assessed - Upper Body Bathing: Unsupported sitting Lower Body Bathing: Simulated;Moderate assistance (with AE) Upper Body Dressing: Simulated;Set up Where Assessed - Upper Body Dressing: Unsupported sitting Lower Body Dressing: Simulated;Maximal assistance (without AE) Where Assessed - Lower Body Dressing: Unsupported sit to stand Toilet Transfer: Performed;Minimal assistance Toilet Transfer Method: Sit to stand Toilet Transfer Equipment: Raised toilet seat with arms (or 3-in-1 over toilet) Toileting - Clothing Manipulation and Hygiene: Simulated;Minimal assistance Where Assessed -  Engineer, mining and Hygiene: Sit to stand from 3-in-1 or toilet Equipment Used: Rolling walker;Gait belt Transfers/Ambulation Related to ADLs: Min A for all with noted pt's RLE weak    OT Diagnosis: Generalized weakness;Acute pain  OT Problem List: Decreased strength;Decreased range of motion;Pain;Impaired balance (sitting and/or standing);Decreased knowledge of use of DME or AE;Decreased knowledge of precautions OT Treatment Interventions: Self-care/ADL training;DME and/or AE instruction;Patient/family education;Balance training   OT Goals Acute Rehab OT Goals OT Goal Formulation: With patient Time For Goal Achievement: 06/14/12 Potential to Achieve Goals: Good ADL Goals Pt Will Perform Grooming: with supervision;Unsupported;Standing at sink ADL Goal: Grooming - Progress: Goal set today Pt Will Perform Lower Body Bathing: with set-up;with supervision;Sitting at sink;Standing at sink;Unsupported;with adaptive equipment ADL Goal: Lower Body Bathing - Progress: Goal set today Pt Will Perform Lower Body Dressing: with set-up;with supervision;with adaptive equipment;Unsupported;Sit to stand from chair;Sit to stand from bed ADL Goal: Lower Body Dressing - Progress: Goal set today Pt Will Transfer to Toilet: with supervision;with DME;Comfort height toilet;3-in-1;Grab bars;Maintaining back safety precautions;Ambulation ADL Goal: Toilet Transfer - Progress: Goal set today Pt Will Perform Toileting - Clothing Manipulation: with modified independence;Standing ADL Goal: Toileting - Clothing Manipulation - Progress: Goal set today Pt Will Perform Toileting - Hygiene: with modified independence;with adaptive equipment;Sit to stand from 3-in-1/toilet ADL Goal: Toileting - Hygiene - Progress: Goal set today Pt Will Perform Tub/Shower Transfer: Shower transfer;with supervision;Ambulation;with DME;Maintaining back safety precautions;Shower seat with back ADL Goal: Web designer -  Progress: Goal set today Miscellaneous OT Goals Miscellaneous OT Goal #1: Pt will be able to follow back precautions OT Goal: Miscellaneous Goal #1 - Progress: Goal set today  Visit Information  Last OT Received On: 05/31/12 Assistance Needed: +1    Subjective Data  Subjective: I can move my right leg better  today   Prior Functioning     Home Living Lives With: Spouse Available Help at Discharge: Family;Available 24 hours/day Type of Home: House Home Access: Stairs to enter [employer] of Steps: 2 Entrance Stairs-Rails: Right;Left;Can reach both Home Layout: Two level;Able to live on main level with bedroom/bathroom Bathroom Shower/Tub: Walk-in shower;Door [employer] Toilet: Standard Home Adaptive Equipment: Built-in shower seat;Grab bars in shower;Hand-held shower hose;Bedside commode/3-in-1;Walker - rolling;Sock aid;Long-handled shoehorn;Long-handled sponge;Reacher Prior Function Level of Independence: Independent with assistive device(s) Able to Take Stairs?: Yes Driving: Yes Vocation: Retired Musician: No difficulties Dominant Hand: Right         Vision/Perception Vision - History Baseline Vision: Wears glasses all the time Patient Visual Report: No change from baseline   Cognition  Cognition Overall Cognitive Status: Appears within functional limits for tasks assessed/performed Arousal/Alertness: Awake/alert Orientation Level: Appears intact for tasks assessed Behavior During Session: [facility] for tasks performed    Extremity/Trunk Assessment Right Upper Extremity Assessment RUE ROM/Strength/Tone: Within functional levels Left Upper Extremity Assessment LUE ROM/Strength/Tone: Within functional levels     Mobility Bed Mobility Bed Mobility: Rolling Left;Left Sidelying to Sit;Sitting - Scoot to Edge of Bed Rolling Left: 6: Modified independent (Device/Increase time);With rail Left Sidelying to Sit: With rails;6: Modified  independent (Device/Increase time);HOB flat Supine to Sit: Not tested (comment) Sitting - Scoot to Edge of Bed: 6: Modified independent (Device/Increase time);With rail Sit to Sidelying Left: 6: Modified independent (Device/Increase time);HOB flat Details for Bed Mobility Assistance: Pt able to manage RLE with some pain but tolerable.  Transfers Transfers: Sit to Stand;Stand to Sit Sit to Stand: 4: Min assist;With upper extremity assist;From bed Stand to Sit: 4: Min assist;With upper extremity assist;With armrests;To chair/3-in-1 Details for Transfer Assistance: Supervision for safety secondary to c/o R LE weakness.         Balance Balance Balance Assessed: No   End of Session OT - End of Session Equipment Utilized During Treatment: Gait belt;Back brace (RW) Activity Tolerance: Patient limited by pain Patient left: in chair;with call bell/phone within reach  GO Functional Assessment Tool Used: Clinical observation Functional Limitation: Self care Self Care Current Status (Z6109): At least 60 percent but less than 80 percent impaired, limited or restricted Self Care Goal Status (U0454): At least 1 percent but less than 20 percent impaired, limited or restricted   Evette Georges [phone] 05/31/2012, 2:33 PM

## 2012-05-31 NOTE — Progress Notes (Signed)
Physical Therapy Treatment Patient Details Name: [name] MRN: [id number] DOB: [birth year] Today's Date: 05/31/2012 Time: 4098-1191 PT Time Calculation (min): 58 min  PT Assessment / Plan / Recommendation Comments on Treatment Session  Spoke with pt and family about discharge status.  Pt is not a candidate for CIR secondary to observation status.  Pt mobility much improved now that pain is managed.  Instructed pt to continue to apply ice to R hip.  Acute PT will continue to progress pt and assess for most appropriate d/c plan.  At this time pt should be safe to return to home.  Suggesting HHPT to progress strength in R LE.      Follow Up Recommendations  Home health PT     Does the patient have the potential to tolerate intense rehabilitation     Barriers to Discharge        Equipment Recommendations  None recommended by PT    Recommendations for Other Services    Frequency Min 5X/week   Plan Discharge plan remains appropriate;Frequency remains appropriate    Precautions / Restrictions Precautions Precautions: Fall;Back Precaution Booklet Issued: Yes (comment) Precaution Comments: able to recall 3/3 and adhered to these with mobility Required Braces or Orthoses: Spinal Brace Spinal Brace: Lumbar corset;Applied in sitting position Restrictions Weight Bearing Restrictions: No   Pertinent Vitals/Pain 3/10 pain in R hip and low back.  Applied ice pack to R hip at conclusion of session.     Mobility  Bed Mobility Bed Mobility: Rolling Left;Left Sidelying to Sit;Sitting - Scoot to Edge of Bed Rolling Left: 6: Modified independent (Device/Increase time);With rail Left Sidelying to Sit: With rails;6: Modified independent (Device/Increase time);HOB flat Supine to Sit: Not tested (comment) Sitting - Scoot to Edge of Bed: 6: Modified independent (Device/Increase time);With rail Sit to Sidelying Left: 6: Modified independent (Device/Increase time);HOB flat Details for Bed Mobility  Assistance: Pt able to manage RLE with some pain but tolerable.  Transfers Transfers: Sit to Stand;Stand to [employer] Transfers Sit to Stand: 4: Min assist;With upper extremity assist;From bed Stand to Sit: 4: Min assist;With upper extremity assist;With armrests;To chair/3-in-1 Stand Pivot Transfers: 5: Supervision Details for Transfer Assistance: Supervision for safety secondary to c/o R LE weakness.  Ambulation/Gait Ambulation/Gait Assistance: 5: Supervision Ambulation Distance (Feet): 100 Feet Assistive device: Rolling walker Ambulation/Gait Assistance Details: Supervision for safety secondary to c/o RLE weakness.  Gait Pattern: Trunk flexed Stairs: Yes Stairs Assistance: 4: Min guard Stairs Assistance Details (indicate cue type and reason): Pt initially attempting to ascend stairs sideways with RLE lead.  Pain in R hip increased and pt was unsteady.  Instructed pt to use cane in opposite hand of Rail and ascend/descend stair forward.  Pt safer with [place] crutch than cane.   Stair Management Technique: One rail Left;Forwards;With cane Number of Stairs: 2 (2 trials) Wheelchair Mobility Wheelchair Mobility: No    Exercises     PT Diagnosis:    PT Problem List:   PT Treatment Interventions:     PT Goals Acute Rehab PT Goals PT Goal Formulation: With patient Time For Goal Achievement: 06/06/12 Potential to Achieve Goals: Good Pt will Roll Supine to Right Side: with modified independence PT Goal: Rolling Supine to Right Side - Progress: Met Pt will Roll Supine to Left Side: with modified independence PT Goal: Rolling Supine to Left Side - Progress: Met Pt will go Supine/Side to Sit: with modified independence PT Goal: Supine/Side to Sit - Progress: Progressing toward goal Pt will Sit  at [facility] of Bed: with modified independence PT Goal: Sit at [facility] Of Bed - Progress: Progressing toward goal Pt will go Sit to Supine/Side: with modified independence PT Goal: Sit to  Supine/Side - Progress: Progressing toward goal Pt will go Sit to Stand: with modified independence PT Goal: Sit to Stand - Progress: Progressing toward goal Pt will go Stand to Sit: with modified independence PT Goal: Stand to Sit - Progress: Progressing toward goal PT Transfer Goal: Bed to Chair/Chair to Bed - Progress: Progressing toward goal Pt will Stand: with modified independence PT Goal: Stand - Progress: Progressing toward goal Pt will Ambulate: >150 feet;with modified independence PT Goal: Ambulate - Progress: Progressing toward goal Pt will Go Up / Down Stairs: 3-5 stairs;with supervision;with rail(s) PT Goal: Up/Down Stairs - Progress: Progressing toward goal  Visit Information  Last PT Received On: 05/31/12 Assistance Needed: +1    Subjective Data  Subjective: Pt reports pain in hip and back much improved. Family concerned about pt ability to return to home safely   Patient Stated Goal: Regain prior function and return home.   Cognition  Cognition Overall Cognitive Status: Appears within functional limits for tasks assessed/performed Arousal/Alertness: Awake/alert Orientation Level: Appears intact for tasks assessed Behavior During Session: [facility] for tasks performed    Balance  Balance Balance Assessed: No  End of Session PT - End of Session Equipment Utilized During Treatment: Gait belt;Back brace Activity Tolerance: Patient tolerated treatment well Patient left: in bed;with call bell/phone within reach;with family/visitor present Nurse Communication: Mobility status;Other (comment)   GP     Anyra Kaufman 05/31/2012, 2:30 PM Ezzie Senat L. Tommy Goostree DPT [phone]

## 2012-06-01 DIAGNOSIS — S32009A Unspecified fracture of unspecified lumbar vertebra, initial encounter for closed fracture: Secondary | ICD-10-CM | POA: Diagnosis not present

## 2012-06-01 DIAGNOSIS — K5900 Constipation, unspecified: Secondary | ICD-10-CM | POA: Diagnosis not present

## 2012-06-01 DIAGNOSIS — I4891 Unspecified atrial fibrillation: Secondary | ICD-10-CM | POA: Diagnosis not present

## 2012-06-01 DIAGNOSIS — M25559 Pain in unspecified hip: Secondary | ICD-10-CM | POA: Diagnosis not present

## 2012-06-01 LAB — CBC
Hemoglobin: 9.4 g/dL — ABNORMAL LOW (ref 13.0–17.0)
MCH: 31.8 pg (ref 26.0–34.0)
MCHC: 33.9 g/dL (ref 30.0–36.0)
Platelets: 301 10*3/uL (ref 150–400)
RDW: 16 % — ABNORMAL HIGH (ref 11.5–15.5)

## 2012-06-01 LAB — BASIC METABOLIC PANEL
Calcium: 8.8 mg/dL (ref 8.4–10.5)
GFR calc Af Amer: 89 mL/min — ABNORMAL LOW (ref >90)
GFR calc non Af Amer: 77 mL/min — ABNORMAL LOW (ref >90)
Sodium: 133 mEq/L — ABNORMAL LOW (ref 135–145)

## 2012-06-01 LAB — PROTIME-INR
INR: 2.56 — ABNORMAL HIGH (ref 0.00–1.49)
Prothrombin Time: 26.3 seconds — ABNORMAL HIGH (ref 11.6–15.2)

## 2012-06-01 MED ORDER — WARFARIN SODIUM 5 MG PO TABS
5.0000 mg | ORAL_TABLET | Freq: Every day | ORAL | Status: AC
  Administered 2012-06-01: 5 mg via ORAL
  Filled 2012-06-01: qty 1

## 2012-06-01 MED ORDER — POLYETHYLENE GLYCOL 3350 17 G PO PACK
17.0000 g | PACK | Freq: Every day | ORAL | Status: DC
  Administered 2012-06-01: 17 g via ORAL
  Filled 2012-06-01 (×3): qty 1

## 2012-06-01 NOTE — Progress Notes (Signed)
AM BP 90/46 coreg am dose held

## 2012-06-01 NOTE — Progress Notes (Signed)
Occupational Therapy Treatment and Discharge Patient Details Name: [name] MRN: [id number] DOB: Jan 27, 1933 Today's Date: 06/01/2012 Time: 4098-1191 OT Time Calculation (min): 54 min  OT Assessment / Plan / Recommendation Comments on Treatment Session No goals were addressed today however all current questions about DME were addressed and recommendations made to pt/wife and her in chart. Pt states his focus right now is not on basic self care, but rather ambulation and walking. No further OT needs, will sign off. Will benefit from conitnued OT at home which he received once before he ended back here in hospital.    Follow Up Recommendations  Home health OT;Supervision/Assistance - 24 hour       Equipment Recommendations  Tub/shower seat;Other (comment) (single point cane--pt knows he will have to pay for both)       Frequency Min 2X/week   Plan Discharge plan remains appropriate    Precautions / Restrictions Precautions Precautions: Fall;Back Required Braces or Orthoses: Spinal Brace Spinal Brace: Lumbar corset;Applied in sitting position       ADL  ADL Comments: Per pt and wife [name] was there HHOT (she also works here) so I gave her a call about what she saw their needs were at home. The 3n1 was too large to fit in their shower stall at home and so Lawson Fiscal was recommending the standard grey tub seat with back (pt and wife wanted to see one so I brought it to the room and showed them how it adjusted) and then the other concern was a proper seat for the patient to sit in at home since he did not have one with arms that was also firm. Lawson Fiscal had talked with pt and wife about  the kind of chair that is a lift chair but without the lift component. The wife misunderstood and thought Lawson Fiscal was going to tell her where to get one, I explained to the patient and wife that Lawson Fiscal was only trying to give them a concrete example of what kind of chair they needed to be looking for. I wrote down  where she could go look at lift chair on Monday so then she could go to a furniture store to pick out something similar (without lift component, firm , and that does not rock). I also wrote down the dimensions of the recliner in the pt's room since he said that it was perfect for him (measurements for floor to seat height, seat to top of backrest height, and to arm rest height, and seat depth). Wife was also interested in what  kind of cane the PT was recommending and what was meant by it should be adjusted to come to the patient's wrist crease--so I got a cane  and showed her and patient how it adjusted and how the appropriate height is measured based off of the patient.        Visit Information  Last OT Received On: 06/01/12 Assistance Needed: +1    Subjective Data  Subjective: I am really worn out right now for being up 2 times today and then I had an accident and that took an hour to get cleaned up, all I really want to be expending my energy on is walking and doing the stairs. Once I get that down then I will focus more on taking care of myself" I do have some questions though--and his wife did too      Cognition  Cognition Overall Cognitive Status: Appears within functional limits for tasks  assessed/performed Arousal/Alertness: Awake/alert Orientation Level: Appears intact for tasks assessed Behavior During Session: [facility] for tasks performed             End of Session OT - End of Session Patient left: in bed;with family/visitor present       [name] [phone] 06/01/2012, 5:35 PM

## 2012-06-01 NOTE — Progress Notes (Signed)
Physical Therapy Treatment Patient Details Name: [name] MRN: [id number] DOB: 1932-11-05 Today's Date: 06/01/2012 Time: 7846-9629 PT Time Calculation (min): 25 min  PT Assessment / Plan / Recommendation Comments on Treatment Session  Pt very pleasant & motivated.   Cont's to c/o Rt hip pain/weakness/numbness.  Pt states if he d/c's home today he will have his sons available to (A).   Pt reports mobility is improving since this admission but still not as well as he was when he was d/c'd from prior hospitalization.      Follow Up Recommendations  Home health PT     Does the patient have the potential to tolerate intense rehabilitation     Barriers to Discharge        Equipment Recommendations  None recommended by PT    Recommendations for Other Services    Frequency Min 5X/week   Plan Discharge plan remains appropriate;Frequency remains appropriate    Precautions / Restrictions Precautions Precautions: Fall;Back Precaution Comments: able to recall 3/3 and adhered to these with mobility Required Braces or Orthoses: Spinal Brace Spinal Brace: Lumbar corset;Applied in sitting position Restrictions Weight Bearing Restrictions: No   Pertinent Vitals/Pain BP 101/53.  denies pain while supine in bed but reports pain/weakness/numbness in Rt hip with ambulation.  Ice pack applied at end of session.      Mobility  Bed Mobility Bed Mobility: Rolling Left;Left Sidelying to Sit;Sitting - Scoot to Edge of Bed Rolling Left: 6: Modified independent (Device/Increase time) Left Sidelying to Sit: 6: Modified independent (Device/Increase time);HOB flat Sitting - Scoot to Edge of Bed: 6: Modified independent (Device/Increase time) Transfers Transfers: Sit to Stand;Stand to Sit Sit to Stand: 4: Min guard;With upper extremity assist;From bed Stand to Sit: 4: Min guard;With upper extremity assist;With armrests;To chair/3-in-1 Details for Transfer Assistance: No cues for hand placement  needed.  Guarding for safety Ambulation/Gait Ambulation/Gait Assistance: 4: Min guard Ambulation Distance (Feet): 120 Feet Assistive device: Rolling walker Ambulation/Gait Assistance Details: Guarding for safety due to pt reports Rt LE feels weak & numb.   Cues for tall posture, & body positioning inside RW.  Pt tends to keep knees in flexed position during stance phase.   Gait Pattern: Step-through pattern;Trunk flexed;Right flexed knee in stance;Left flexed knee in stance Gait velocity: decreased speed      PT Goals Acute Rehab PT Goals Time For Goal Achievement: 06/06/12 Potential to Achieve Goals: Good Pt will Roll Supine to Right Side: with modified independence Pt will Roll Supine to Left Side: with modified independence PT Goal: Rolling Supine to Left Side - Progress: Met Pt will go Supine/Side to Sit: with modified independence PT Goal: Supine/Side to Sit - Progress: Met Pt will Sit at Edge of Bed: with modified independence PT Goal: Sit at [place] Of Bed - Progress: Met Pt will go Sit to Supine/Side: with modified independence Pt will go Sit to Stand: with modified independence PT Goal: Sit to Stand - Progress: Progressing toward goal Pt will go Stand to Sit: with modified independence PT Goal: Stand to Sit - Progress: Progressing toward goal Pt will Stand: with modified independence Pt will Ambulate: >150 feet;with modified independence PT Goal: Ambulate - Progress: Progressing toward goal Pt will Go Up / Down Stairs: 3-5 stairs;with supervision;with rail(s)  Visit Information  Last PT Received On: 06/01/12 Assistance Needed: +1    Subjective Data      Cognition  Cognition Overall Cognitive Status: Appears within functional limits for tasks assessed/performed Arousal/Alertness: Awake/alert Orientation Level:  Appears intact for tasks assessed Behavior During Session: [facility] for tasks performed    Balance     End of Session PT - End of Session Equipment Utilized  During Treatment: Gait belt;Back brace Activity Tolerance: Patient tolerated treatment well Patient left: in chair;with call bell/phone within reach;Other (comment) (ice pack applied to Rt hip) Nurse Communication: Mobility status;Other (comment)     Verdell Face, [place] [phone] 06/01/2012

## 2012-06-01 NOTE — Progress Notes (Signed)
Subjective: Doing fair. Right post hip hematoma is still an issue and causes pain with every step. Hypotensive this AM. No BM. No cardiac sxs   Objective: Vital signs in last 24 hours: Temp:  [97.9 F (36.6 C)-98 F (36.7 C)] 97.9 F (36.6 C) (03/22 0500) Pulse Rate:  [84-86] 86 (03/22 0500) Resp:  [18-20] 20 (03/22 0500) BP: (90-110)/(46-60) 90/46 mmHg (03/22 0848) SpO2:  [96 %-97 %] 97 % (03/22 0500)  Intake/Output from previous day: 03/21 0701 - 03/22 0700 In: 560 [P.O.:560] Out: 1525 [Urine:1525] Intake/Output this shift:    General: alert , lying at 20 degrees. Face symmetric, neck supple lungs clear ht regular with murmur abd soft NT, extrems: large post right hip hematoma/swelling. Distal pulses OK alert awake clear speech  Lab Results   Recent Labs  05/31/12 0621 06/01/12 0534  WBC 8.6 8.2  RBC 2.92* 2.96*  HGB 9.1* 9.4*  HCT 26.8* 27.7*  MCV 91.8 93.6  MCH 31.2 31.8  RDW 16.1* 16.0*  PLT 278 301    Recent Labs  05/31/12 0621 06/01/12 0534  NA 131* 133*  K 4.4 4.1  CL 97 98  CO2 26 25  GLUCOSE 96 93  BUN 14 15  CREATININE 1.04 0.97  CALCIUM 8.6 8.8    Studies/Results: No results found.  Scheduled Meds: . alfuzosin  10 mg Oral QHS  . calcitonin (salmon)  1 spray Alternating Nares Daily  . carvedilol  6.25 mg Oral BID WC  . cycloSPORINE  1 drop Both Eyes BID  . hydrocortisone cream   Topical BID  . levothyroxine  150 mcg Oral QAC breakfast  . lidocaine  1 patch Transdermal Q24H  . pantoprazole  40 mg Oral Daily  . rosuvastatin  5 mg Oral q1800  . Warfarin - Pharmacist Dosing Inpatient   Does not apply q1800   Continuous Infusions:  PRN Meds:acetaminophen, alum & mag hydroxide-simeth, bisacodyl, cyclobenzaprine, HYDROcodone-acetaminophen, nitroGLYCERIN, polyethylene glycol, promethazine, zolpidem  Assessment/Plan:   Active Problems:  Gait instability  Vertebral fracture, osteoporotic  Rectal bleed  Hypotension   L1 and L4 Compression  Fractue - Traumatic/osteoporotic @ 2 weeks ago undergoing PT/OT at home and making progress - continue PT/OT here. He is not ready for D/C yet. Continue working on pain Administrator, sports and Ab Binder for support. Continue conservative treatment without vertebroplasty or kyphoplasty. Continue Miacalcin NS. Pain control is reasonable in back Spinal Stenosis - Pain control.  R Hip pain and spasms. - More issues. Will add heat to try to get blood to absorb. Situation compounded by coumadin Nausea improving.  Anemia and Rectal Bleeding: stable at  9.4 Chronic Anticoagulation due to AFib and H/O PE. INR Therapeutic.  UTI - S/P Macrodantin and Keflex - Repeating UA was (-).  CAD/CABG twice/H/O MI - No current Angina. Cardiac Rehab as outpatient.  Afib on chronic anticoagulation - INR Goal 2-3. He is rate controlled.  VVI Pacer/ICD/H/O NSVT - Dr Graciela Husbands.  Ischemic CM c EF 25-30% - ECHO done in [place] was stable. Back on Lasix 1-3 times per week.  Severe AS S/P TAVI 05/2010 at [facility] - doing well.  Prostate CA S/P Seeds/XRT and BPH - Uroxatrol.  Hypothyroidism on Rx  Constipation - Needs Bowel prep again for BMs.  Chronic OA  GERD with H/O Stricture S/P Dilitation - Omeprazole.  TIA 11/2011  Hyperlipidemia - Only able to tolerate Crestor 5 QOD with less than perfect control. He did not tolerate Lipitor in past  DVT Prophylaxis -  Via his coumadin  Hemorrhoid - HC cream and suppositories. No straining HYPOTENSION: coreg held this AM. At high risk for falls if BP stays low. Need to make sure this improves prior to D/C Constipation: Add miralax   LOS: 3 days   Hiroto Saltzman [name] 06/01/2012, 10:27 AM

## 2012-06-01 NOTE — Progress Notes (Signed)
ANTICOAGULATION CONSULT NOTE - Follow up Consult  Pharmacy Consult:  Coumadin Indication: atrial fibrillation  Allergies  Allergen Reactions  . Penicillins Shortness Of Breath and Rash  . Ace Inhibitors     Has not tolerated in the past due to hyperkalemia  . Antihistamines, Diphenhydramine-Type Other (See Comments)    Inhibits urination  . Amiodarone     Vital Signs: Temp: 97.9 F (36.6 C) (03/22 0500) BP: 90/46 mmHg (03/22 0848) Pulse Rate: 86 (03/22 0500)  Labs:  Recent Labs  05/30/12 0625 05/31/12 0621 06/01/12 0534  HGB 9.8* 9.1* 9.4*  HCT 28.5* 26.8* 27.7*  PLT 289 278 301  LABPROT 25.7* 24.7* 26.3*  INR 2.48* 2.35* 2.56*  CREATININE 0.92 1.04 0.97        Assessment: 80 YOM recently discharged on 05/24/12 from [facility] after transfered from an out-of-state hospital s/p fall that resulted in traumatic lumbar compression fractures and right hip/flank hematoma.  Readmitted 05/29/12 due to worsening back pain and blood in stool.  Patient reported he sometimes has blood in stools from hemorrhoids but this seemed like more blood (INR PAT therapeutic around 2.5).   Dr. Timothy Lasso notes rectal bleeding is presumed hemorrhoidal.  Patient's INR remains therapeutic and stable.  He reports the hemorrhoidal bleeding has stopped and his hematoma is also improving.   Goal of Therapy:  INR 2-3    Plan:  - Continue Coumadin 5mg  PO daily at 1800 - Check INR MWF     Mikhia Dusek D. Laney Potash, PharmD, BCPS Pager:  [phone] 06/01/2012, 10:56 AM

## 2012-06-02 DIAGNOSIS — I4891 Unspecified atrial fibrillation: Secondary | ICD-10-CM | POA: Diagnosis not present

## 2012-06-02 DIAGNOSIS — M25559 Pain in unspecified hip: Secondary | ICD-10-CM | POA: Diagnosis not present

## 2012-06-02 DIAGNOSIS — S32009A Unspecified fracture of unspecified lumbar vertebra, initial encounter for closed fracture: Secondary | ICD-10-CM | POA: Diagnosis not present

## 2012-06-02 DIAGNOSIS — K5900 Constipation, unspecified: Secondary | ICD-10-CM | POA: Diagnosis not present

## 2012-06-02 LAB — BASIC METABOLIC PANEL
CO2: 27 mEq/L (ref 19–32)
Calcium: 8.7 mg/dL (ref 8.4–10.5)
Sodium: 131 mEq/L — ABNORMAL LOW (ref 135–145)

## 2012-06-02 LAB — CBC
MCH: 31.6 pg (ref 26.0–34.0)
Platelets: 321 10*3/uL (ref 150–400)
RBC: 2.94 MIL/uL — ABNORMAL LOW (ref 4.22–5.81)
WBC: 7.9 10*3/uL (ref 4.0–10.5)

## 2012-06-02 MED ORDER — CARVEDILOL 3.125 MG PO TABS: 3.1250 mg | ORAL_TABLET | Freq: Two times a day (BID) | ORAL | Status: DC

## 2012-06-02 MED ORDER — CYCLOBENZAPRINE HCL 5 MG PO TABS: 5.0000 mg | ORAL_TABLET | Freq: Three times a day (TID) | ORAL | Status: DC | PRN

## 2012-06-02 MED ORDER — CARVEDILOL 3.125 MG PO TABS
3.1250 mg | ORAL_TABLET | Freq: Two times a day (BID) | ORAL | Status: DC
  Filled 2012-06-02 (×2): qty 1

## 2012-06-02 NOTE — Progress Notes (Addendum)
BP 95/67 auto cuff, attempted manual, due to afib difficult to assess when systolic BP starts, very irregular.  Coreg held this am.

## 2012-06-02 NOTE — Progress Notes (Signed)
D/c instructions reviewed with pt and his wife, included discussion of pt weighing self daily as he has a hx of CHF he states. He weighs him self daily and has a nurse that speaks with him weekly about any changes in weight and discusses interventions as needed, pt also very aware of low sodium diet and very knowledgeable of his self care pertaining to his CHF and fluids.   Copy of instructions given to pt. Pt d/c'd via wheelchair with wife and with belongings, escorted by unit NT.

## 2012-06-02 NOTE — Progress Notes (Signed)
Physical Therapy Treatment Patient Details Name: ZAKK BORGEN MRN: [id number] DOB: 1933-03-12 Today's Date: 06/02/2012 Time: 4098-1191 PT Time Calculation (min): 24 min  PT Assessment / Plan / Recommendation Comments on Treatment Session  Pt cont's to report significant soreness/discomfort in Rt hip/LE but able to progress with mobility.  Did well on stairs & ambulating from ortho gym back to his room.  Pt reports he feels comfortable with d/cing home today if MD feels hes ready for d/c.      Follow Up Recommendations  Home health PT     Does the patient have the potential to tolerate intense rehabilitation     Barriers to Discharge        Equipment Recommendations  None recommended by PT    Recommendations for Other Services    Frequency Min 5X/week   Plan Discharge plan remains appropriate;Frequency remains appropriate    Precautions / Restrictions Precautions Precautions: Fall;Back Required Braces or Orthoses: Spinal Brace Spinal Brace: Lumbar corset;Applied in sitting position Restrictions Weight Bearing Restrictions: No       Mobility  Bed Mobility Bed Mobility: Rolling Left;Left Sidelying to Sit Rolling Left: 6: Modified independent (Device/Increase time) Left Sidelying to Sit: 6: Modified independent (Device/Increase time) Transfers Transfers: Sit to Stand;Stand to Sit Sit to Stand: 4: Min guard;With upper extremity assist;With armrests;From bed;From chair/3-in-1 Stand to Sit: 5: Supervision;With upper extremity assist;With armrests;To chair/3-in-1 Details for Transfer Assistance: Cues for hand placement with stand>sit.  Slightly effortful to achieve standing today.   Ambulation/Gait Ambulation/Gait Assistance: 4: Min guard Ambulation Distance (Feet): 200 Feet Assistive device: Rolling walker Ambulation/Gait Assistance Details: Cues for tall posture, hip/knee extension, & stay closer to RW.   Gait Pattern: Step-through pattern (knees slightly flexed) Stairs:  Yes Stairs Assistance: 4: Min guard Stairs Assistance Details (indicate cue type and reason): Performed sideways using Rt rail & leading with Lt LE.  Pt did very well.   Stair Management Technique: One rail Right;Sideways;Step to pattern Number of Stairs: 3 (2x's) Wheelchair Mobility Wheelchair Mobility: No      PT Goals Acute Rehab PT Goals Time For Goal Achievement: 06/06/12 Potential to Achieve Goals: Good Pt will Roll Supine to Right Side: with modified independence Pt will Roll Supine to Left Side: with modified independence PT Goal: Rolling Supine to Left Side - Progress: Met Pt will go Supine/Side to Sit: with modified independence PT Goal: Supine/Side to Sit - Progress: Met Pt will Sit at Edge of Bed: with modified independence Pt will go Sit to Supine/Side: with modified independence Pt will go Sit to Stand: with modified independence PT Goal: Sit to Stand - Progress: Not met Pt will go Stand to Sit: with modified independence PT Goal: Stand to Sit - Progress: Progressing toward goal Pt will Stand: with modified independence Pt will Ambulate: >150 feet;with modified independence PT Goal: Ambulate - Progress: Progressing toward goal Pt will Go Up / Down Stairs: 3-5 stairs;with supervision;with rail(s) PT Goal: Up/Down Stairs - Progress: Progressing toward goal  Visit Information  Last PT Received On: 06/02/12 Assistance Needed: +1    Subjective Data      Cognition  Cognition Overall Cognitive Status: Appears within functional limits for tasks assessed/performed Arousal/Alertness: Awake/alert Orientation Level: Appears intact for tasks assessed Behavior During Session: [facility] for tasks performed    Balance     End of Session PT - End of Session Equipment Utilized During Treatment: Gait belt;Back brace Activity Tolerance: Patient tolerated treatment well Patient left: in chair;with call bell/phone within  reach Nurse Communication: Mobility status     Verdell Face, [place] [phone] 06/02/2012

## 2012-06-02 NOTE — Discharge Summary (Addendum)
DISCHARGE SUMMARY  [name]  MR#: [id number]  DOB:1932/07/18  Date of Admission: 05/29/2012 Date of Discharge: 06/02/2012  Attending Physician:[name] [name]  Patient's FAO:ZHYQM,VHQI M, MD  Consults: none  Discharge Diagnoses: Principal Problem:   Acute low back pain L1 and L4 Compression Fractue - Traumatic/osteoporotic @ 2 weeks ago undergoing PT/OT at home and making progress - continue PT/OT here. He is not ready for D/C yet. Continue working on pain Administrator, sports and Ab Binder for support. Continue conservative treatment without vertebroplasty or kyphoplasty. Continue Miacalcin NS. Pain control is reasonable in back  Spinal Stenosis - Pain control.  R Hip pain and spasms. - with large buttock hematoma Nausea improved Anemia and Rectal Bleeding: likely hemorrhoidal exacerbated by coumadin Rx Chronic Anticoagulation due to AFib and H/O PE. INR Therapeutic. At 2.56 UTI - finished with Rx CAD/CABG twice/H/O MI - No current Angina. Cardiac Rehab as outpatient.  Afib on chronic anticoagulation - INR Goal 2-3. He is rate controlled.  VVI Pacer/ICD/H/O NSVT - Dr [name].  Ischemic CM c EF 25-30% - ECHO done in [place] was stable. Back on Lasix 1-3 times per week.  Severe AS S/P TAVI 05/2010 at [facility] - doing well.  Prostate CA S/P Seeds/XRT and BPH - Uroxatrol.  Hypothyroidism on Rx  Constipation - better with Miralax Chronic OA  GERD with H/O Stricture S/P Dilitation - Omeprazole.  TIA [name]  Hyperlipidemia -on Rx DVT Prophylaxis - Via his coumadin  Hemorrhoid - HC cream and suppositories. No straining  HYPOTENSION:coreg adjusted Constipation: improved Hyponatremia: mild    Gait instability     Discharge Medications:   Medication List    STOP taking these medications       cephALEXin 500 MG capsule  Commonly known as:  KEFLEX      TAKE these medications       acetaminophen 325 MG tablet  Commonly known as:  TYLENOL  Take 650 mg by mouth every  6 (six) hours as needed for pain or fever.     alfuzosin 10 MG 24 hr tablet  Commonly known as:  UROXATRAL  Take 10 mg by mouth at bedtime.     aspirin 81 MG chewable tablet  Chew 81 mg by mouth daily.     calcitonin (salmon) 200 UNIT/ACT nasal spray  Commonly known as:  MIACALCIN/FORTICAL  Place 1 spray into the nose daily. Alternate nostrils every other day.     carvedilol 3.125 MG tablet  Commonly known as:  COREG  Take 1 tablet (3.125 mg total) by mouth 2 (two) times daily with a meal.     cyclobenzaprine 5 MG tablet  Commonly known as:  FLEXERIL  Take 1 tablet (5 mg total) by mouth 3 (three) times daily as needed for muscle spasms.     furosemide 20 MG tablet  Commonly known as:  LASIX  Take 20 mg by mouth every 3 (three) days.     HYDROcodone-acetaminophen 5-325 MG per tablet  Commonly known as:  NORCO/VICODIN  Take 1 tablet by mouth every 6 (six) hours as needed for pain.     levothyroxine 150 MCG tablet  Commonly known as:  SYNTHROID, LEVOTHROID  Take 1 tablet (150 mcg total) by mouth daily.     nitroGLYCERIN 0.4 MG SL tablet  Commonly known as:  NITROSTAT  Place 0.4 mg under the tongue every 5 (five) minutes x 3 doses as needed for chest pain. For chest pain     omeprazole 20 MG capsule  Commonly known as:  PRILOSEC  Take 20 mg by mouth daily as needed (acid reflux).     ondansetron 4 MG tablet  Commonly known as:  ZOFRAN  Take 4 mg by mouth every 8 (eight) hours as needed for nausea.     polyethylene glycol packet  Commonly known as:  MIRALAX / GLYCOLAX  Take 17 g by mouth daily as needed (constipation).     RESTASIS 0.05 % ophthalmic emulsion  Generic drug:  cycloSPORINE  Place 1 drop into both eyes every 12 (twelve) hours.     rosuvastatin 5 MG tablet  Commonly known as:  CRESTOR  Take 5 mg by mouth daily.     sildenafil 100 MG tablet  Commonly known as:  VIAGRA  Take 100 mg by mouth daily as needed for erectile dysfunction.     warfarin 5 MG  tablet  Commonly known as:  COUMADIN  Take 5 mg by mouth every evening.        Hospital Procedures: Dg Lumbar Spine 2-3 Views  05/30/2012  *RADIOLOGY REPORT*  Clinical Data: Compression fracture.  Pain.  LUMBAR SPINE - 2-3 VIEW  Comparison: Chest CT 02/17/2012.  Abdominal and pelvic CT 03/19/2009.  Findings: Anterior wedge compression fracture of the L1 vertebral body involving the superior plate with 16% loss of height and slight retropulsion of the posterior-superior aspect.  This is age indeterminate, possibly acute and  new  compared to the 2011 CT.  Schmorl's node deformity superior plate L4 new since prior 2011 CT.  L2-3 moderate posterior disc space narrowing with mild retrolisthesis L2.  Facet joint degenerative changes L3-4 through L5-S1.  Vascular calcifications.  IMPRESSION: Compression fracture L1 vertebra and Schmorl's node deformity superior endplate L4 as detailed above.  This is a call report.   Original Report Authenticated By: [name], [name].D.    Dg Hip Complete Right  05/30/2012  *RADIOLOGY REPORT*  Clinical Data: Right hip pain.  RIGHT HIP - COMPLETE 2+ VIEW  Comparison: None.  Findings: No fracture or dislocation.  No significant hip joint degenerative changes.  No plain film evidence of femoral head avascular necrosis.  Vascular calcifications.  IMPRESSION: No acute abnormality.  Please see above.   Original Report Authenticated By: [name], [name].D.    Dg Abd 1 View  05/30/2012  *RADIOLOGY REPORT*  Clinical Data: Nausea.  ABDOMEN - 1 VIEW  Comparison: 09/10/2006.  Findings: Progressive degenerative changes lower thoracic spine and upper lumbar spine with suggestion of loss of height of the L1 vertebral body incompletely assessed on the AP view.  Surgical clips right upper quadrant.  Nonspecific bowel gas pattern without plain film evidence of bowel obstruction.  The possibility of free intraperitoneal air cannot be addressed on a supine view.  Vascular calcifications.   IMPRESSION: No plain film evidence of bowel obstruction.  Please see above.   Original Report Authenticated By: [name], [name].D.     History of Present Illness: Fall with back fracture  Hospital Course: Had a fall in [facility] and was transferred up here. New spinal fx and a large right buttocks hematoma noted. Pain issues and constipation issues are slowly improving. Worked well with therapy and now can negotiate steps. BP has been too low. In light of falls and gait issues along with signficant coronary issues, he was held over to adjust Rx to increase safety. Still moving slowly and needs home PT.  Issue of rectal bleeding appears to have been hemorrhoid relaed (along with anticoag).  No  cardiac CP. Resp status stable. Blood count is fair at 9.3. INR therapeutic at 2.56. On calcitonin for pain but will need "real" osteoporosis meds (?forteo) addressed as an outpt  Day of Discharge Exam BP 95/67  Pulse 87  Temp(Src) 98.2 F (36.8 C) (Oral)  Resp 18  SpO2 99%  Physical Exam: General appearance: alert sitting up in no distress Eyes: no scleral icterus Throat: oropharynx moist without erythema Resp: clear to auscultation bilaterally Cardio: regular rate and rhythm and irregularly irregular rhythm with murmur GI: soft, non-tender; bowel sounds normal; no masses,  no organomegaly Extremities: large right buttocks hematoma. Pulses OK no edema Awake, alert no tremor, unsteady gait  Discharge Labs:  Recent Labs  06/01/12 0534 06/02/12 0540  NA 133* 131*  K 4.1 4.1  CL 98 97  CO2 25 27  GLUCOSE 93 96  BUN 15 13  CREATININE 0.97 1.05  CALCIUM 8.8 8.7      Recent Labs  06/01/12 0534 06/02/12 0540  WBC 8.2 7.9  HGB 9.4* 9.3*  HCT 27.7* 27.7*  MCV 93.6 94.2  PLT 301 321   Lab Results  Component Value Date   INR 2.56* 06/01/2012   INR 2.35* 05/31/2012   INR 2.48* 05/30/2012    05/31/2012      Component Results        Specimen Description   URINE, CLEAN CATCH                  Colony Count   NO GROWTH   Culture   NO GROWTH   Report Status   05/31/2012 FINAL     Summary: of venous dopplers  - No evidence of deep vein or superficial thrombosis involving the right lower extremity and left common femoral vein. - No evidence of Baker's cyst on the right.   Discharge instructions: Note new dose of coreg Keep heat intermittantly on right buttock hematoma      Future Appointments Provider Department Dept Phone   07/08/2012 9:15 AM [facility] [employer] Main Office [place]) [phone]   07/31/2012 11:00 AM Tonny Bollman, MD [place] [facility] [name] Main Office [place]) [phone]    Recommendations for Other Services by PT  Frequency  Min 5X/week  Plan Discharge plan remains appropriate;Frequency remains appropriate  Precautions / Restrictions Precautions  Precautions: Fall;Back  Required Braces or Orthoses: Spinal Brace  Spinal Brace: Lumbar corset;Applied in sitting position  Restrictions  Weight Bearing Restrictions: No    Disposition: home  Follow-up Appts: Follow-up with Dr.Russo at [facility] in a week.  Call for appointment.  Condition on Discharge: improved  Tests Needing Follow-up: none  Signed: Clydia Nieves [name] 06/02/2012, 11:10 AM

## 2012-06-04 ENCOUNTER — Encounter (HOSPITAL_COMMUNITY): Payer: Medicare Other

## 2012-06-05 ENCOUNTER — Encounter (HOSPITAL_COMMUNITY): Payer: Medicare Other

## 2012-06-06 ENCOUNTER — Encounter (HOSPITAL_COMMUNITY): Payer: Medicare Other

## 2012-06-10 ENCOUNTER — Ambulatory Visit (INDEPENDENT_AMBULATORY_CARE_PROVIDER_SITE_OTHER): Payer: Medicare Other | Admitting: Pharmacist

## 2012-06-10 DIAGNOSIS — Z7901 Long term (current) use of anticoagulants: Secondary | ICD-10-CM | POA: Diagnosis not present

## 2012-06-10 DIAGNOSIS — Z5181 Encounter for therapeutic drug level monitoring: Secondary | ICD-10-CM

## 2012-06-10 DIAGNOSIS — I4891 Unspecified atrial fibrillation: Secondary | ICD-10-CM | POA: Diagnosis not present

## 2012-06-10 DIAGNOSIS — I482 Chronic atrial fibrillation, unspecified: Secondary | ICD-10-CM

## 2012-06-10 LAB — POCT INR: INR: 4.1

## 2012-06-11 ENCOUNTER — Encounter (HOSPITAL_COMMUNITY): Payer: Medicare Other

## 2012-06-11 DIAGNOSIS — K5900 Constipation, unspecified: Secondary | ICD-10-CM | POA: Diagnosis not present

## 2012-06-11 DIAGNOSIS — Z7901 Long term (current) use of anticoagulants: Secondary | ICD-10-CM | POA: Diagnosis not present

## 2012-06-11 DIAGNOSIS — I428 Other cardiomyopathies: Secondary | ICD-10-CM | POA: Diagnosis not present

## 2012-06-11 DIAGNOSIS — M810 Age-related osteoporosis without current pathological fracture: Secondary | ICD-10-CM | POA: Diagnosis not present

## 2012-06-11 DIAGNOSIS — T148XXA Other injury of unspecified body region, initial encounter: Secondary | ICD-10-CM | POA: Diagnosis not present

## 2012-06-12 ENCOUNTER — Encounter (HOSPITAL_COMMUNITY): Payer: Medicare Other

## 2012-06-13 ENCOUNTER — Encounter (HOSPITAL_COMMUNITY): Payer: Medicare Other

## 2012-06-17 ENCOUNTER — Ambulatory Visit (INDEPENDENT_AMBULATORY_CARE_PROVIDER_SITE_OTHER): Payer: Medicare Other | Admitting: Cardiology

## 2012-06-17 DIAGNOSIS — Z5181 Encounter for therapeutic drug level monitoring: Secondary | ICD-10-CM

## 2012-06-17 DIAGNOSIS — I482 Chronic atrial fibrillation, unspecified: Secondary | ICD-10-CM

## 2012-06-17 DIAGNOSIS — Z7901 Long term (current) use of anticoagulants: Secondary | ICD-10-CM

## 2012-06-17 DIAGNOSIS — I4891 Unspecified atrial fibrillation: Secondary | ICD-10-CM

## 2012-06-17 LAB — POCT INR: INR: 4.6

## 2012-06-18 ENCOUNTER — Encounter (HOSPITAL_COMMUNITY): Payer: Medicare Other

## 2012-06-19 ENCOUNTER — Encounter (HOSPITAL_COMMUNITY): Payer: Medicare Other

## 2012-06-20 ENCOUNTER — Encounter (HOSPITAL_COMMUNITY): Payer: Medicare Other

## 2012-06-24 ENCOUNTER — Telehealth: Payer: Self-pay | Admitting: Internal Medicine

## 2012-06-24 ENCOUNTER — Ambulatory Visit (INDEPENDENT_AMBULATORY_CARE_PROVIDER_SITE_OTHER): Payer: Medicare Other | Admitting: Cardiology

## 2012-06-24 DIAGNOSIS — Z5181 Encounter for therapeutic drug level monitoring: Secondary | ICD-10-CM

## 2012-06-24 DIAGNOSIS — Z7901 Long term (current) use of anticoagulants: Secondary | ICD-10-CM

## 2012-06-24 DIAGNOSIS — I4891 Unspecified atrial fibrillation: Secondary | ICD-10-CM

## 2012-06-24 DIAGNOSIS — I482 Chronic atrial fibrillation, unspecified: Secondary | ICD-10-CM

## 2012-06-24 NOTE — Telephone Encounter (Signed)
Attempted to call the patient/ spouse back. No answer x about 10 rings. Machine off.

## 2012-06-24 NOTE — Telephone Encounter (Signed)
New Problem:    Patient's wife called in wanting to know when her husband needed to be seen.  Please call back.

## 2012-06-24 NOTE — Telephone Encounter (Signed)
I spoke with the patient's wife. She wanted to know when the patient needed to be seen due to his battery being close to ERI. I have advised that he is due for a phone transmission on 4/28. He will send this. The patient's wife also states he fell on vacation about 5 weeks ago and broke 2 places in his back. He was airlifted from [place] to [facility]. No surgery was performed.

## 2012-06-25 ENCOUNTER — Encounter (HOSPITAL_COMMUNITY): Payer: Medicare Other

## 2012-06-26 ENCOUNTER — Encounter (HOSPITAL_COMMUNITY): Payer: Medicare Other

## 2012-06-27 ENCOUNTER — Encounter (HOSPITAL_COMMUNITY): Payer: Medicare Other

## 2012-07-02 ENCOUNTER — Encounter (HOSPITAL_COMMUNITY): Payer: Medicare Other

## 2012-07-02 ENCOUNTER — Ambulatory Visit (INDEPENDENT_AMBULATORY_CARE_PROVIDER_SITE_OTHER): Payer: Medicare Other | Admitting: Pharmacist

## 2012-07-02 DIAGNOSIS — M810 Age-related osteoporosis without current pathological fracture: Secondary | ICD-10-CM | POA: Diagnosis not present

## 2012-07-02 DIAGNOSIS — K5900 Constipation, unspecified: Secondary | ICD-10-CM | POA: Diagnosis not present

## 2012-07-02 DIAGNOSIS — R627 Adult failure to thrive: Secondary | ICD-10-CM | POA: Diagnosis not present

## 2012-07-02 DIAGNOSIS — I482 Chronic atrial fibrillation, unspecified: Secondary | ICD-10-CM

## 2012-07-02 DIAGNOSIS — Z7901 Long term (current) use of anticoagulants: Secondary | ICD-10-CM | POA: Diagnosis not present

## 2012-07-02 DIAGNOSIS — I4891 Unspecified atrial fibrillation: Secondary | ICD-10-CM

## 2012-07-02 DIAGNOSIS — Z5181 Encounter for therapeutic drug level monitoring: Secondary | ICD-10-CM

## 2012-07-02 DIAGNOSIS — T148XXA Other injury of unspecified body region, initial encounter: Secondary | ICD-10-CM | POA: Diagnosis not present

## 2012-07-02 DIAGNOSIS — I428 Other cardiomyopathies: Secondary | ICD-10-CM | POA: Diagnosis not present

## 2012-07-03 ENCOUNTER — Encounter (HOSPITAL_COMMUNITY): Payer: Medicare Other

## 2012-07-04 ENCOUNTER — Encounter (HOSPITAL_COMMUNITY): Payer: Medicare Other

## 2012-07-08 ENCOUNTER — Other Ambulatory Visit: Payer: Self-pay

## 2012-07-08 ENCOUNTER — Ambulatory Visit (INDEPENDENT_AMBULATORY_CARE_PROVIDER_SITE_OTHER): Payer: Medicare Other | Admitting: *Deleted

## 2012-07-08 ENCOUNTER — Encounter: Payer: Self-pay | Admitting: Internal Medicine

## 2012-07-08 DIAGNOSIS — Z95810 Presence of automatic (implantable) cardiac defibrillator: Secondary | ICD-10-CM

## 2012-07-08 DIAGNOSIS — I2589 Other forms of chronic ischemic heart disease: Secondary | ICD-10-CM | POA: Diagnosis not present

## 2012-07-08 DIAGNOSIS — I255 Ischemic cardiomyopathy: Secondary | ICD-10-CM

## 2012-07-09 ENCOUNTER — Ambulatory Visit (INDEPENDENT_AMBULATORY_CARE_PROVIDER_SITE_OTHER): Payer: Medicare Other | Admitting: Cardiovascular Disease

## 2012-07-09 ENCOUNTER — Encounter (HOSPITAL_COMMUNITY): Payer: Medicare Other

## 2012-07-09 DIAGNOSIS — I482 Chronic atrial fibrillation, unspecified: Secondary | ICD-10-CM

## 2012-07-09 DIAGNOSIS — I4891 Unspecified atrial fibrillation: Secondary | ICD-10-CM

## 2012-07-09 DIAGNOSIS — Z5181 Encounter for therapeutic drug level monitoring: Secondary | ICD-10-CM

## 2012-07-09 DIAGNOSIS — Z7901 Long term (current) use of anticoagulants: Secondary | ICD-10-CM

## 2012-07-09 LAB — POCT INR: INR: 2.1

## 2012-07-10 ENCOUNTER — Encounter (HOSPITAL_COMMUNITY): Payer: Medicare Other

## 2012-07-11 ENCOUNTER — Encounter (HOSPITAL_COMMUNITY): Payer: Medicare Other

## 2012-07-15 ENCOUNTER — Telehealth: Payer: Self-pay | Admitting: Internal Medicine

## 2012-07-15 NOTE — Telephone Encounter (Signed)
New problem    Transmit results.  Patient calling stating he hasn't receive any information.

## 2012-07-15 NOTE — Telephone Encounter (Signed)
Transmission received on 07-08-12. Pt aware and aware letter will be sent. Pt understands.

## 2012-07-16 ENCOUNTER — Encounter (HOSPITAL_COMMUNITY): Payer: Medicare Other

## 2012-07-16 ENCOUNTER — Ambulatory Visit (INDEPENDENT_AMBULATORY_CARE_PROVIDER_SITE_OTHER): Payer: Medicare Other | Admitting: Cardiology

## 2012-07-16 DIAGNOSIS — Z7901 Long term (current) use of anticoagulants: Secondary | ICD-10-CM

## 2012-07-16 DIAGNOSIS — I482 Chronic atrial fibrillation, unspecified: Secondary | ICD-10-CM

## 2012-07-16 DIAGNOSIS — Z5181 Encounter for therapeutic drug level monitoring: Secondary | ICD-10-CM

## 2012-07-16 DIAGNOSIS — I4891 Unspecified atrial fibrillation: Secondary | ICD-10-CM

## 2012-07-16 LAB — REMOTE ICD DEVICE
BRDY-0002RV: 40 {beats}/min
CHARGE TIME: 9.69 s
DEV-0020ICD: NEGATIVE
RV LEAD IMPEDENCE ICD: 600 Ohm
TZAT-0001FASTVT: 1
TZAT-0001SLOWVT: 2
TZAT-0004FASTVT: 8
TZAT-0004SLOWVT: 6
TZAT-0004SLOWVT: 8
TZAT-0005FASTVT: 88 pct
TZAT-0012SLOWVT: 200 ms
TZAT-0012SLOWVT: 200 ms
TZAT-0019SLOWVT: 8 V
TZAT-0019SLOWVT: 8 V
TZAT-0020SLOWVT: 1.6 ms
TZAT-0020SLOWVT: 1.6 ms
TZON-0003SLOWVT: 400 ms
TZON-0011AFLUTTER: 70
TZST-0001FASTVT: 2
TZST-0001FASTVT: 4
TZST-0001FASTVT: 5
TZST-0001SLOWVT: 3
TZST-0001SLOWVT: 5
TZST-0003FASTVT: 35 J
TZST-0003FASTVT: 35 J
TZST-0003SLOWVT: 35 J
TZST-0003SLOWVT: 35 J
TZST-0003SLOWVT: 5 J
VENTRICULAR PACING ICD: 1 pct

## 2012-07-17 ENCOUNTER — Encounter (HOSPITAL_COMMUNITY): Payer: Medicare Other

## 2012-07-18 ENCOUNTER — Encounter (HOSPITAL_COMMUNITY): Payer: Medicare Other

## 2012-07-23 ENCOUNTER — Ambulatory Visit (INDEPENDENT_AMBULATORY_CARE_PROVIDER_SITE_OTHER): Payer: Medicare Other | Admitting: Cardiovascular Disease

## 2012-07-23 ENCOUNTER — Encounter (HOSPITAL_COMMUNITY): Payer: Medicare Other

## 2012-07-23 DIAGNOSIS — Z5181 Encounter for therapeutic drug level monitoring: Secondary | ICD-10-CM

## 2012-07-23 DIAGNOSIS — Z7901 Long term (current) use of anticoagulants: Secondary | ICD-10-CM

## 2012-07-23 DIAGNOSIS — I4891 Unspecified atrial fibrillation: Secondary | ICD-10-CM

## 2012-07-23 DIAGNOSIS — I482 Chronic atrial fibrillation, unspecified: Secondary | ICD-10-CM

## 2012-07-24 ENCOUNTER — Encounter (HOSPITAL_COMMUNITY): Payer: Medicare Other

## 2012-07-25 ENCOUNTER — Encounter (HOSPITAL_COMMUNITY): Payer: Medicare Other

## 2012-07-30 ENCOUNTER — Ambulatory Visit (INDEPENDENT_AMBULATORY_CARE_PROVIDER_SITE_OTHER): Payer: Medicare Other | Admitting: Cardiology

## 2012-07-30 ENCOUNTER — Ambulatory Visit: Payer: Medicare Other | Admitting: Cardiovascular Disease

## 2012-07-30 ENCOUNTER — Encounter (HOSPITAL_COMMUNITY): Payer: Medicare Other

## 2012-07-30 DIAGNOSIS — I789 Disease of capillaries, unspecified: Secondary | ICD-10-CM | POA: Diagnosis not present

## 2012-07-30 DIAGNOSIS — I4891 Unspecified atrial fibrillation: Secondary | ICD-10-CM | POA: Diagnosis not present

## 2012-07-30 DIAGNOSIS — Z85828 Personal history of other malignant neoplasm of skin: Secondary | ICD-10-CM | POA: Diagnosis not present

## 2012-07-30 DIAGNOSIS — Z5181 Encounter for therapeutic drug level monitoring: Secondary | ICD-10-CM

## 2012-07-30 DIAGNOSIS — Z7901 Long term (current) use of anticoagulants: Secondary | ICD-10-CM

## 2012-07-30 DIAGNOSIS — L821 Other seborrheic keratosis: Secondary | ICD-10-CM | POA: Diagnosis not present

## 2012-07-30 DIAGNOSIS — L819 Disorder of pigmentation, unspecified: Secondary | ICD-10-CM | POA: Diagnosis not present

## 2012-07-30 DIAGNOSIS — L570 Actinic keratosis: Secondary | ICD-10-CM | POA: Diagnosis not present

## 2012-07-30 DIAGNOSIS — L578 Other skin changes due to chronic exposure to nonionizing radiation: Secondary | ICD-10-CM | POA: Diagnosis not present

## 2012-07-30 DIAGNOSIS — I482 Chronic atrial fibrillation, unspecified: Secondary | ICD-10-CM

## 2012-07-30 LAB — POCT INR: INR: 3.2

## 2012-07-31 ENCOUNTER — Ambulatory Visit (INDEPENDENT_AMBULATORY_CARE_PROVIDER_SITE_OTHER): Payer: Medicare Other | Admitting: Cardiovascular Disease

## 2012-07-31 ENCOUNTER — Encounter: Payer: Self-pay | Admitting: Cardiovascular Disease

## 2012-07-31 ENCOUNTER — Encounter (HOSPITAL_COMMUNITY): Payer: Medicare Other

## 2012-07-31 VITALS — BP 114/64 | HR 76 | Ht 75.6 in | Wt 195.0 lb

## 2012-07-31 DIAGNOSIS — I359 Nonrheumatic aortic valve disorder, unspecified: Secondary | ICD-10-CM | POA: Diagnosis not present

## 2012-07-31 MED ORDER — CARVEDILOL 6.25 MG PO TABS: 6.2500 mg | ORAL_TABLET | Freq: Two times a day (BID) | ORAL | Status: DC

## 2012-07-31 NOTE — Progress Notes (Signed)
HPI:  79 year old gentleman presenting for followup evaluation. He's been followed by Dr. Riley [name]. He has a complex cardiac history. He's had CABG in 1979 originally and redo CABG in 1991. He has a severe ischemic cardiomyopathy with left ventricular ejection fraction 25%. He underwent transcatheter aortic valve replacement in 2012 at [facility] clinic for severe symptomatic aortic stenosis. He has permanent atrial fibrillation and is maintained on long-term anticoagulation.  The patient had a fall when he was in [place]. He has multiple compression fractures of his back. Consideration was made for vertebroplasty, but the decision was made to treat him conservatively considering his multiple medical problems. He's had a lot more difficulty with control of his INRs since his compression fracture. His carvedilol dose was reduced because of low blood pressure. This was also done in [place] when he was hospitalized.  He is making slow but steady recovery. His back pain has improved. He is wearing a brace. The patient is here with his wife today. He hasn't taken narcotic analgesics in several days. He denies chest pain or pressure, dyspnea with low-level activity, or leg swelling. He's had no lightheadedness or syncope.  Outpatient Encounter Prescriptions as of 07/31/2012  Medication Sig Dispense Refill  . acetaminophen (TYLENOL) 325 MG tablet Take 650 mg by mouth every 6 (six) hours as needed for pain or fever.      Marland Kitchen alendronate (FOSAMAX) 70 MG tablet Take 70 mg by mouth every 7 (seven) days. Take with a full glass of water on an empty stomach.      Marland Kitchen alfuzosin (UROXATRAL) 10 MG 24 hr tablet Take 10 mg by mouth at bedtime.       Marland Kitchen aspirin 81 MG chewable tablet Chew 81 mg by mouth daily.       . carvedilol (COREG) 3.125 MG tablet Take 1 tablet (3.125 mg total) by mouth 2 (two) times daily with a meal.  60 tablet  3  . cyclobenzaprine (FLEXERIL) 5 MG tablet Take 1 tablet (5 mg total) by mouth 3 (three)  times daily as needed for muscle spasms.  30 tablet  0  . furosemide (LASIX) 20 MG tablet Take 20 mg by mouth as needed.       Marland Kitchen HYDROcodone-acetaminophen (NORCO/VICODIN) 5-325 MG per tablet Take 1 tablet by mouth every 6 (six) hours as needed for pain.      Marland Kitchen levothyroxine (SYNTHROID, LEVOTHROID) 150 MCG tablet Take 1 tablet (150 mcg total) by mouth daily.  90 tablet  1  . nitroGLYCERIN (NITROSTAT) 0.4 MG SL tablet Place 0.4 mg under the tongue every 5 (five) minutes x 3 doses as needed for chest pain. For chest pain      . omeprazole (PRILOSEC) 20 MG capsule Take 20 mg by mouth daily as needed (acid reflux).      . ondansetron (ZOFRAN) 4 MG tablet Take 4 mg by mouth every 8 (eight) hours as needed for nausea.      . polyethylene glycol (MIRALAX / GLYCOLAX) packet Take 17 g by mouth daily as needed (constipation).      . RESTASIS 0.05 % ophthalmic emulsion Place 1 drop into both eyes every 12 (twelve) hours.       . sildenafil (VIAGRA) 100 MG tablet Take 100 mg by mouth daily as needed for erectile dysfunction.      Marland Kitchen warfarin (COUMADIN) 5 MG tablet Take 5 mg by mouth every evening.      . rosuvastatin (CRESTOR) 5 MG tablet Take 5 mg by  mouth daily. Take every other day       No facility-administered encounter medications on file as of 07/31/2012.    Allergies  Allergen Reactions  . Penicillins Shortness Of Breath and Rash  . Ace Inhibitors     Has not tolerated in the past due to hyperkalemia  . Antihistamines, Diphenhydramine-Type Other (See Comments)    Inhibits urination  . Amiodarone     Past Medical History  Diagnosis Date  . Ischemic cardiomyopathy 05/2010    Has EF of 25%  . Diverticulitis     CURRENTLY CONTROLLED WITH NO EVIDENCE OF RECURRENT INFECTION  . AF (atrial fibrillation)     Has not tolerated amiodarone in the past. Amiodarone was stopped in September of 2010 due to side effects  . Chronic anticoagulation     on coumadin  . ICD (implantable cardiac defibrillator)  in place   . Prostate cancer   . Osteoarthritis     RIGHT KNEE  . Aortic stenosis, severe     WITH PERCUTANEOUS AORTIC VALVE (TAVI) IN March 2012 at the [facility]  . MI (myocardial infarction) 1974, 41  . Cervical myelopathy   . Pulmonary embolism   . Hyperlipidemia   . GERD (gastroesophageal reflux disease)   . Esophageal stricture     WITH DILATATION  . NSVT (nonsustained ventricular tachycardia)   . Coronary artery disease   . Stroke   . Heart murmur   . Pacemaker     ROS: Negative except as per HPI  BP 114/64  Pulse 76  Ht 6' 3.6" (1.92 m)  Wt 88.451 kg (195 lb)  BMI 23.99 kg/m2  PHYSICAL EXAM: Pt is alert and oriented, pleasant elderly male in NAD HEENT: normal Neck: JVP - normal, carotids 2+= without bruits Lungs: CTA bilaterally CV: RRR with a grade 2/6 diastolic decrescendo murmur best heard at the right upper sternal border Abd: soft, NT, Positive BS, no hepatomegaly Ext: no C/C/E, distal pulses intact and equal Skin: warm/dry no rash  2-D echocardiogram September 2013: Left ventricle: The cavity size was normal. There was mild concentric hypertrophy. Systolic function was severely reduced. The estimated ejection fraction was in the range of 20% to 25%. Regional wall motion abnormalities: Akinesis of the mid-distalanteroseptal, anterior, and apical myocardium. Akinesis of the inferior myocardium.  ------------------------------------------------------------ Aortic valve: A bioprosthetic aortic valve is in place. There may be mild rocking and some lucency is seen around the valve ring. Moderate regurgitation is seen and may be pervalvular rather than valvular. Suggest TEE to evaluate further Doppler: Transvalvular velocity was within the normal range. There was no stenosis. Moderate regurgitation. VTI ratio of LVOT to aortic valve: 0.45. Peak velocity ratio of LVOT to aortic valve: 0.42. Mean gradient: 8mm Hg (S). Peak gradient: 18mm Hg  (S).  ------------------------------------------------------------ Aorta: Aortic root: The aortic root was normal in size.  ------------------------------------------------------------ Mitral valve: Structurally normal valve. Mobility was not restricted. Doppler: Transvalvular velocity was within the normal range. There was no evidence for stenosis. Mild to moderate regurgitation.  ------------------------------------------------------------ Left atrium: The atrium was severely dilated.  ------------------------------------------------------------ Right ventricle: The cavity size was dilated. Wall thickness was normal. Pacer wire or catheter noted in right ventricle. Systolic function was normal.  ------------------------------------------------------------ Pulmonic valve: Not well visualized. The valve appears to be grossly normal. Doppler: Transvalvular velocity was within the normal range. There was no evidence for stenosis.  ------------------------------------------------------------ Tricuspid valve: Structurally normal valve. Doppler: Transvalvular velocity was within the normal range. Mild regurgitation.  ------------------------------------------------------------  Pulmonary artery: The main pulmonary artery was normal-sized. Systolic pressure was mildly to moderately increased.  ------------------------------------------------------------ Right atrium: The atrium was mildly to moderately dilated.  ------------------------------------------------------------ Pericardium: There was no pericardial effusion.  ------------------------------------------------------------ Systemic veins: Inferior vena cava: The vessel was dilated.  ASSESSMENT AND PLAN: 1. Coronary artery disease, native vessel status post redo CABG. The patient has no symptoms of angina. He has an ischemic cardiomyopathy. Overall he seems stable.  2. Aortic stenosis status post TAVR. He describes a  valve and valve procedure with an Edwards Sapien valve. He has mild to moderate perivalvular aortic insufficiency which I can appreciate on his exam. He continues to have 6 month followup in [place] and goes back in September for a repeat echocardiogram.  3. Permanent atrial fibrillation. The patient is maintained on long-term warfarin. He's had more difficult control of his INRs recently. His situation is complicated because of multiple prior strokes and TIAs. Fortunately he doesn't have much residual. He also has had recurrent pulmonary emboli. I'm going to review his situation with Dr. Riley [name] since he knows him well. I wonder if we should consider switch to Xarelto since it has an indication for Atrial fib and PE.  4. Chronic systolic heart failure. Will increase his carvedilol back to 6.25 mg daily. I suspect his blood pressure was low in [place] because he had some blood loss after falling onto his hip, and also was hospitalized requiring narcotics for pain control. He otherwise will continue his current medical program.  For followup I will see him back in 3 months.  [name] 07/31/2012 1:57 PM

## 2012-07-31 NOTE — Patient Instructions (Addendum)
Your physician has recommended you make the following change in your medication: INCREASE Carvedilol to 6.25mg  take one by mouth twice a day  Your physician recommends that you schedule a follow-up appointment in: 3 MONTHS with Dr Excell Seltzer

## 2012-08-01 ENCOUNTER — Encounter (HOSPITAL_COMMUNITY): Payer: Medicare Other

## 2012-08-06 ENCOUNTER — Ambulatory Visit (INDEPENDENT_AMBULATORY_CARE_PROVIDER_SITE_OTHER): Payer: Medicare Other | Admitting: Cardiology

## 2012-08-06 ENCOUNTER — Encounter (HOSPITAL_COMMUNITY): Payer: Medicare Other

## 2012-08-06 DIAGNOSIS — I4891 Unspecified atrial fibrillation: Secondary | ICD-10-CM

## 2012-08-06 DIAGNOSIS — Z5181 Encounter for therapeutic drug level monitoring: Secondary | ICD-10-CM

## 2012-08-06 DIAGNOSIS — Z7901 Long term (current) use of anticoagulants: Secondary | ICD-10-CM

## 2012-08-06 DIAGNOSIS — I482 Chronic atrial fibrillation, unspecified: Secondary | ICD-10-CM

## 2012-08-07 ENCOUNTER — Encounter (HOSPITAL_COMMUNITY): Payer: Medicare Other

## 2012-08-08 ENCOUNTER — Encounter: Payer: Self-pay | Admitting: *Deleted

## 2012-08-08 ENCOUNTER — Encounter (HOSPITAL_COMMUNITY): Payer: Medicare Other

## 2012-08-13 ENCOUNTER — Encounter (HOSPITAL_COMMUNITY): Payer: Medicare Other

## 2012-08-14 ENCOUNTER — Encounter (HOSPITAL_COMMUNITY): Payer: Medicare Other

## 2012-08-15 ENCOUNTER — Encounter (HOSPITAL_COMMUNITY): Payer: Medicare Other

## 2012-08-20 ENCOUNTER — Encounter (HOSPITAL_COMMUNITY): Payer: Medicare Other

## 2012-08-20 ENCOUNTER — Other Ambulatory Visit: Payer: Self-pay | Admitting: *Deleted

## 2012-08-20 ENCOUNTER — Other Ambulatory Visit: Payer: Self-pay | Admitting: Cardiology

## 2012-08-20 MED ORDER — LEVOTHYROXINE SODIUM 150 MCG PO TABS: 150.0000 ug | ORAL_TABLET | Freq: Every day | ORAL | Status: DC

## 2012-08-21 ENCOUNTER — Ambulatory Visit (INDEPENDENT_AMBULATORY_CARE_PROVIDER_SITE_OTHER): Payer: Medicare Other | Admitting: *Deleted

## 2012-08-21 ENCOUNTER — Encounter (HOSPITAL_COMMUNITY): Payer: Medicare Other

## 2012-08-21 DIAGNOSIS — I4891 Unspecified atrial fibrillation: Secondary | ICD-10-CM | POA: Diagnosis not present

## 2012-08-21 DIAGNOSIS — Z5181 Encounter for therapeutic drug level monitoring: Secondary | ICD-10-CM | POA: Diagnosis not present

## 2012-08-21 DIAGNOSIS — Z7901 Long term (current) use of anticoagulants: Secondary | ICD-10-CM

## 2012-08-21 DIAGNOSIS — I482 Chronic atrial fibrillation, unspecified: Secondary | ICD-10-CM

## 2012-08-21 NOTE — Telephone Encounter (Signed)
It looks like a refill was sent into the pharmacy on 08/20/12 for a 90 supply on this medication.  This medication should be refilled by the pt's PCP in the future.

## 2012-08-22 ENCOUNTER — Encounter (HOSPITAL_COMMUNITY): Payer: Medicare Other

## 2012-08-27 ENCOUNTER — Encounter (HOSPITAL_COMMUNITY): Payer: Medicare Other

## 2012-08-28 ENCOUNTER — Encounter (HOSPITAL_COMMUNITY): Payer: Medicare Other

## 2012-08-29 ENCOUNTER — Encounter (HOSPITAL_COMMUNITY): Payer: Medicare Other

## 2012-09-03 ENCOUNTER — Encounter (HOSPITAL_COMMUNITY): Payer: Medicare Other

## 2012-09-04 ENCOUNTER — Ambulatory Visit (INDEPENDENT_AMBULATORY_CARE_PROVIDER_SITE_OTHER): Payer: Medicare Other | Admitting: Cardiology

## 2012-09-04 ENCOUNTER — Encounter (HOSPITAL_COMMUNITY): Payer: Medicare Other

## 2012-09-04 DIAGNOSIS — I4891 Unspecified atrial fibrillation: Secondary | ICD-10-CM | POA: Diagnosis not present

## 2012-09-04 DIAGNOSIS — I482 Chronic atrial fibrillation, unspecified: Secondary | ICD-10-CM

## 2012-09-04 DIAGNOSIS — Z7901 Long term (current) use of anticoagulants: Secondary | ICD-10-CM | POA: Diagnosis not present

## 2012-09-04 DIAGNOSIS — Z5181 Encounter for therapeutic drug level monitoring: Secondary | ICD-10-CM

## 2012-09-05 ENCOUNTER — Encounter (HOSPITAL_COMMUNITY): Payer: Medicare Other

## 2012-09-09 ENCOUNTER — Other Ambulatory Visit: Payer: Self-pay | Admitting: Cardiology

## 2012-09-10 ENCOUNTER — Encounter (HOSPITAL_COMMUNITY): Payer: Medicare Other

## 2012-09-11 ENCOUNTER — Encounter (HOSPITAL_COMMUNITY): Payer: Medicare Other

## 2012-09-12 ENCOUNTER — Encounter (HOSPITAL_COMMUNITY): Payer: Medicare Other

## 2012-09-16 DIAGNOSIS — R972 Elevated prostate specific antigen [PSA]: Secondary | ICD-10-CM | POA: Diagnosis not present

## 2012-09-17 ENCOUNTER — Encounter (HOSPITAL_COMMUNITY): Payer: Medicare Other

## 2012-09-18 ENCOUNTER — Ambulatory Visit (INDEPENDENT_AMBULATORY_CARE_PROVIDER_SITE_OTHER): Payer: Medicare Other | Admitting: Internal Medicine

## 2012-09-18 ENCOUNTER — Encounter (HOSPITAL_COMMUNITY): Payer: Medicare Other

## 2012-09-18 DIAGNOSIS — Z5181 Encounter for therapeutic drug level monitoring: Secondary | ICD-10-CM

## 2012-09-18 DIAGNOSIS — I482 Chronic atrial fibrillation, unspecified: Secondary | ICD-10-CM

## 2012-09-18 DIAGNOSIS — Z7901 Long term (current) use of anticoagulants: Secondary | ICD-10-CM

## 2012-09-18 DIAGNOSIS — I4891 Unspecified atrial fibrillation: Secondary | ICD-10-CM

## 2012-09-19 ENCOUNTER — Encounter (HOSPITAL_COMMUNITY): Payer: Medicare Other

## 2012-09-23 DIAGNOSIS — C61 Malignant neoplasm of prostate: Secondary | ICD-10-CM | POA: Diagnosis not present

## 2012-09-23 DIAGNOSIS — N320 Bladder-neck obstruction: Secondary | ICD-10-CM | POA: Diagnosis not present

## 2012-09-24 ENCOUNTER — Encounter (HOSPITAL_COMMUNITY): Payer: Medicare Other

## 2012-09-25 ENCOUNTER — Encounter (HOSPITAL_COMMUNITY): Payer: Medicare Other

## 2012-09-26 ENCOUNTER — Encounter (HOSPITAL_COMMUNITY): Payer: Medicare Other

## 2012-10-01 ENCOUNTER — Encounter (HOSPITAL_COMMUNITY): Payer: Medicare Other

## 2012-10-02 ENCOUNTER — Ambulatory Visit (INDEPENDENT_AMBULATORY_CARE_PROVIDER_SITE_OTHER): Payer: Medicare Other | Admitting: Cardiology

## 2012-10-02 ENCOUNTER — Encounter (HOSPITAL_COMMUNITY): Payer: Medicare Other

## 2012-10-02 DIAGNOSIS — I482 Chronic atrial fibrillation, unspecified: Secondary | ICD-10-CM

## 2012-10-02 DIAGNOSIS — I4891 Unspecified atrial fibrillation: Secondary | ICD-10-CM

## 2012-10-02 DIAGNOSIS — Z5181 Encounter for therapeutic drug level monitoring: Secondary | ICD-10-CM

## 2012-10-02 DIAGNOSIS — Z7901 Long term (current) use of anticoagulants: Secondary | ICD-10-CM

## 2012-10-02 LAB — POCT INR: INR: 2.5

## 2012-10-03 ENCOUNTER — Encounter (HOSPITAL_COMMUNITY): Payer: Medicare Other

## 2012-10-08 ENCOUNTER — Encounter (HOSPITAL_COMMUNITY): Payer: Medicare Other

## 2012-10-08 DIAGNOSIS — M810 Age-related osteoporosis without current pathological fracture: Secondary | ICD-10-CM | POA: Diagnosis not present

## 2012-10-08 DIAGNOSIS — I359 Nonrheumatic aortic valve disorder, unspecified: Secondary | ICD-10-CM | POA: Diagnosis not present

## 2012-10-08 DIAGNOSIS — I428 Other cardiomyopathies: Secondary | ICD-10-CM | POA: Diagnosis not present

## 2012-10-08 DIAGNOSIS — I4891 Unspecified atrial fibrillation: Secondary | ICD-10-CM | POA: Diagnosis not present

## 2012-10-08 DIAGNOSIS — K219 Gastro-esophageal reflux disease without esophagitis: Secondary | ICD-10-CM | POA: Diagnosis not present

## 2012-10-08 DIAGNOSIS — I2510 Atherosclerotic heart disease of native coronary artery without angina pectoris: Secondary | ICD-10-CM | POA: Diagnosis not present

## 2012-10-08 DIAGNOSIS — Z1331 Encounter for screening for depression: Secondary | ICD-10-CM | POA: Diagnosis not present

## 2012-10-08 DIAGNOSIS — R627 Adult failure to thrive: Secondary | ICD-10-CM | POA: Diagnosis not present

## 2012-10-08 DIAGNOSIS — K5900 Constipation, unspecified: Secondary | ICD-10-CM | POA: Diagnosis not present

## 2012-10-09 ENCOUNTER — Encounter (HOSPITAL_COMMUNITY): Payer: Medicare Other

## 2012-10-10 ENCOUNTER — Encounter (HOSPITAL_COMMUNITY): Payer: Medicare Other

## 2012-10-10 ENCOUNTER — Other Ambulatory Visit (HOSPITAL_COMMUNITY): Payer: Self-pay | Admitting: Gastroenterology

## 2012-10-10 DIAGNOSIS — R1310 Dysphagia, unspecified: Secondary | ICD-10-CM

## 2012-10-14 ENCOUNTER — Ambulatory Visit (INDEPENDENT_AMBULATORY_CARE_PROVIDER_SITE_OTHER): Payer: Medicare Other | Admitting: *Deleted

## 2012-10-14 DIAGNOSIS — I255 Ischemic cardiomyopathy: Secondary | ICD-10-CM

## 2012-10-14 DIAGNOSIS — I2589 Other forms of chronic ischemic heart disease: Secondary | ICD-10-CM

## 2012-10-14 DIAGNOSIS — Z95810 Presence of automatic (implantable) cardiac defibrillator: Secondary | ICD-10-CM

## 2012-10-15 ENCOUNTER — Encounter (HOSPITAL_COMMUNITY): Payer: Medicare Other

## 2012-10-16 ENCOUNTER — Encounter (HOSPITAL_COMMUNITY): Payer: Medicare Other

## 2012-10-17 ENCOUNTER — Encounter (HOSPITAL_COMMUNITY): Payer: Medicare Other

## 2012-10-17 LAB — POCT INR: INR: 2.4

## 2012-10-18 ENCOUNTER — Ambulatory Visit (INDEPENDENT_AMBULATORY_CARE_PROVIDER_SITE_OTHER): Payer: Medicare Other | Admitting: Cardiology

## 2012-10-18 DIAGNOSIS — Z7901 Long term (current) use of anticoagulants: Secondary | ICD-10-CM

## 2012-10-18 DIAGNOSIS — I482 Chronic atrial fibrillation, unspecified: Secondary | ICD-10-CM

## 2012-10-18 DIAGNOSIS — I4891 Unspecified atrial fibrillation: Secondary | ICD-10-CM

## 2012-10-18 DIAGNOSIS — Z5181 Encounter for therapeutic drug level monitoring: Secondary | ICD-10-CM

## 2012-10-18 LAB — REMOTE ICD DEVICE
BATTERY VOLTAGE: 2.64 V
CHARGE TIME: 9.69 s
DEV-0020ICD: NEGATIVE
RV LEAD IMPEDENCE ICD: 560 Ohm
TOT-0006: 20140428000000
TZAT-0004FASTVT: 8
TZAT-0004SLOWVT: 8
TZAT-0005FASTVT: 88 pct
TZAT-0011FASTVT: 10 ms
TZAT-0011SLOWVT: 10 ms
TZAT-0011SLOWVT: 10 ms
TZAT-0012FASTVT: 200 ms
TZAT-0012SLOWVT: 200 ms
TZAT-0012SLOWVT: 200 ms
TZAT-0013FASTVT: 1
TZAT-0018FASTVT: NEGATIVE
TZAT-0019SLOWVT: 8 V
TZAT-0019SLOWVT: 8 V
TZON-0003FASTVT: 250 ms
TZON-0003SLOWVT: 400 ms
TZON-0004SLOWVT: 32
TZON-0008SLOWVT: 0 ms
TZON-0011AFLUTTER: 70
TZST-0001FASTVT: 3
TZST-0001FASTVT: 5
TZST-0001SLOWVT: 4
TZST-0001SLOWVT: 6
TZST-0003FASTVT: 35 J
TZST-0003FASTVT: 35 J
TZST-0003FASTVT: 35 J
TZST-0003SLOWVT: 35 J
TZST-0003SLOWVT: 5 J
VENTRICULAR PACING ICD: 2 pct

## 2012-10-21 ENCOUNTER — Ambulatory Visit (HOSPITAL_COMMUNITY)
Admission: RE | Admit: 2012-10-21 | Discharge: 2012-10-21 | Disposition: A | Discharge status: Home / Self Care | Payer: Medicare Other | Source: Ambulatory Visit | Attending: Gastroenterology | Admitting: Gastroenterology

## 2012-10-21 DIAGNOSIS — K219 Gastro-esophageal reflux disease without esophagitis: Secondary | ICD-10-CM | POA: Diagnosis not present

## 2012-10-21 DIAGNOSIS — R1313 Dysphagia, pharyngeal phase: Secondary | ICD-10-CM | POA: Insufficient documentation

## 2012-10-21 DIAGNOSIS — K224 Dyskinesia of esophagus: Secondary | ICD-10-CM | POA: Diagnosis not present

## 2012-10-21 DIAGNOSIS — K449 Diaphragmatic hernia without obstruction or gangrene: Secondary | ICD-10-CM | POA: Diagnosis not present

## 2012-10-21 DIAGNOSIS — R1310 Dysphagia, unspecified: Secondary | ICD-10-CM | POA: Insufficient documentation

## 2012-10-21 NOTE — Procedures (Signed)
Objective Swallowing Evaluation: Modified Barium Swallowing Study  Patient Details  Name: [name] MRN: [id number] Date of Birth: 1932-09-24  Today's Date: 10/21/2012 Time: 4098-1191 SLP Time Calculation (min): 30 min  Past Medical History:  Past Medical History  Diagnosis Date  . Ischemic cardiomyopathy 05/2010    Has EF of 25%  . Diverticulitis     CURRENTLY CONTROLLED WITH NO EVIDENCE OF RECURRENT INFECTION  . AF (atrial fibrillation)     Has not tolerated amiodarone in the past. Amiodarone was stopped in September of 2010 due to side effects  . Chronic anticoagulation     on coumadin  . ICD (implantable cardiac defibrillator) in place   . Prostate cancer   . Osteoarthritis     RIGHT KNEE  . Aortic stenosis, severe     WITH PERCUTANEOUS AORTIC VALVE (TAVI) IN March 2012 at the [facility]  . MI (myocardial infarction) 1974, 58  . Cervical myelopathy   . Pulmonary embolism   . Hyperlipidemia   . GERD (gastroesophageal reflux disease)   . Esophageal stricture     WITH DILATATION  . NSVT (nonsustained ventricular tachycardia)   . Coronary artery disease   . Stroke   . Heart murmur   . Pacemaker    Past Surgical History:  Past Surgical History  Procedure Laterality Date  . Icd  Feb 2003  . Coronary artery bypass graft  1979  . Coronary artery bypass graft  1991    REDO SURGERY  . Cardiac catheterization  2010    SEVERE LV DYSFUNCTION WITH ESTIMATED EJECTION FRACTION OF 25%  . Cholecystectomy    . Aortic valve replacement      Percutaneous AVR in March 2012 at the [facility]  . Cardioversion  02/16/2011    Procedure: CARDIOVERSION;  Surgeon: Luis Abed, MD;  Location: [facility] OR;  Service: Cardiovascular;  Laterality: N/A;  . Shoulder surgery    . Pacemaker placement     HPI:  Pt is a 79 year old male arriving for an outpatient MBS due to complaints of needing to expectorate solid foods during every meal. He has a history of questionable CVA (could  not do MRI due to defibrilator) and stricture with dilatation x3. He denies coughing with thin liquids or any recent pna. He will also have a barium swallow after MBS     Assessment / Plan / Recommendation Clinical Impression  Dysphagia Diagnosis: Mild cervical esophageal phase dysphagia;Mild pharyngeal phase dysphagia Clinical impression: Pt demonstrates a mild to moderate pharyngeal dysphagia with reduced mobility of pharyngeal structures and structural differences increasing swallowing impairment. No penetration or aspiration occurred on this study (though barium swallow reports sensed aspiration). Primary concern is reduced base of tongue propulsion, epiglottic deflection, pharyngeal persitalsis and reduced opneing of UES. this leads to moderate sensed vallecular and pyriform residuals. Pt senses residuals and "hocks" up the stasis to his mouth and swallows again with thin liquids. Anatomically long and curled epiglottis trap residuals. CP opening also reduced. Chin tuck not effective in improving transit of bolus. Recommend pt continue regualr diet and thin liquids with strategies. Pt may benefit from f/u with outpatient SLP for base of tongue and pharyngeal strengthening exercises.     Treatment Recommendation  Defer treatment plan to SLP at (Comment) (outpatient)    Diet Recommendation Regular;Thin liquid   Liquid Administration via: Cup;Straw Medication Administration: Whole meds with puree Supervision: Patient able to self feed Compensations: Slow rate;Multiple dry swallows after each bite/sip;Follow solids with  liquid Postural Changes and/or Swallow Maneuvers: Seated upright 90 degrees;Upright 30-60 min after meal    Other  Recommendations Oral Care Recommendations: Patient independent with oral care   Follow Up Recommendations  Outpatient SLP    Frequency and Duration        Pertinent Vitals/Pain NA    SLP Swallow Goals     General HPI: Pt is a 79 year old male arriving  for an outpatient MBS due to complaints of needing to expectorate solid foods during every meal. He has a history of questionable CVA (could not do MRI due to defibrilator) and stricture with dilatation x3. He denies coughing with thin liquids or any recent pna. He will also have a barium swallow after MBS Type of Study: Modified Barium Swallowing Study Reason for Referral: Objectively evaluate swallowing function Diet Prior to this Study: Regular;Thin liquids Temperature Spikes Noted: N/A Respiratory Status: Room air History of Recent Intubation: No Behavior/Cognition: Alert;Cooperative;Pleasant mood Oral Cavity - Dentition: Adequate natural dentition Oral Motor / Sensory Function: Within functional limits Self-Feeding Abilities: Able to feed self Patient Positioning: Upright in chair Baseline Vocal Quality: Clear Volitional Cough: Strong Volitional Swallow: Able to elicit Anatomy:  (question of bony protrustion at level of UES)    Reason for Referral Objectively evaluate swallowing function   Oral Phase Oral Preparation/Oral Phase Oral Phase: WFL   Pharyngeal Phase Pharyngeal Phase Pharyngeal Phase: Impaired Pharyngeal - Thin Pharyngeal - Thin Cup: Reduced pharyngeal peristalsis;Reduced epiglottic inversion;Reduced tongue base retraction;Reduced anterior laryngeal mobility;Pharyngeal residue - valleculae;Pharyngeal residue - pyriform sinuses Pharyngeal - Thin Straw: Reduced pharyngeal peristalsis;Reduced epiglottic inversion;Reduced tongue base retraction;Reduced anterior laryngeal mobility;Pharyngeal residue - valleculae;Pharyngeal residue - pyriform sinuses Pharyngeal - Solids Pharyngeal - Puree: Reduced pharyngeal peristalsis;Reduced epiglottic inversion;Reduced tongue base retraction;Reduced anterior laryngeal mobility;Pharyngeal residue - valleculae;Pharyngeal residue - pyriform sinuses Pharyngeal - Regular: Reduced pharyngeal peristalsis;Reduced epiglottic inversion;Reduced tongue  base retraction;Reduced anterior laryngeal mobility;Pharyngeal residue - valleculae;Pharyngeal residue - pyriform sinuses Pharyngeal - Pill: Reduced pharyngeal peristalsis;Reduced epiglottic inversion;Reduced tongue base retraction;Reduced anterior laryngeal mobility;Pharyngeal residue - valleculae;Pharyngeal residue - pyriform sinuses  Cervical Esophageal Phase    GO    Cervical Esophageal Phase Cervical Esophageal Phase: Impaired Cervical Esophageal Phase - Comment Cervical Esophageal Comment: Appearance of reduced opening of CP segment/incomplete transit of solid and liquid boluses through UES. Question of bony protrusion on anterior cervical spine impeding full opening of UES.     Functional Assessment Tool Used: clinical judgement Functional Limitations: Swallowing Swallow Current Status ([id number]): At least 20 percent but less than 40 percent impaired, limited or restricted Swallow Goal Status [phone]): At least 20 percent but less than 40 percent impaired, limited or restricted Swallow Discharge Status [phone]): At least 20 percent but less than 40 percent impaired, limited or restricted   [name], MA CCC-SLP [phone]  Claudine Mouton 10/21/2012, 1:48 PM

## 2012-10-22 ENCOUNTER — Encounter (HOSPITAL_COMMUNITY): Payer: Medicare Other

## 2012-10-23 ENCOUNTER — Encounter (HOSPITAL_COMMUNITY): Payer: Medicare Other

## 2012-10-24 ENCOUNTER — Encounter (HOSPITAL_COMMUNITY): Payer: Medicare Other

## 2012-10-28 ENCOUNTER — Telehealth: Payer: Self-pay | Admitting: Cardiovascular Disease

## 2012-10-28 NOTE — Telephone Encounter (Signed)
I spoke with the pt and he was not aware that he already had an appointment scheduled with Dr Excell Seltzer on 11/01/12.  I made the pt aware that this was scheduled from his appointment in May. The pt also said he is taking Crestor every other day and is developing horrible leg pain.  I instructed the pt to hold Crestor at this time and we can discuss his medications further at upcoming appointment.

## 2012-10-28 NOTE — Telephone Encounter (Signed)
New problem   Pt wants to discuss rather or not he needs to go to a future appt that he has

## 2012-10-29 ENCOUNTER — Encounter (HOSPITAL_COMMUNITY): Payer: Medicare Other

## 2012-10-30 ENCOUNTER — Encounter (HOSPITAL_COMMUNITY): Payer: Medicare Other

## 2012-10-31 ENCOUNTER — Ambulatory Visit (INDEPENDENT_AMBULATORY_CARE_PROVIDER_SITE_OTHER): Payer: Medicare Other | Admitting: Cardiology

## 2012-10-31 ENCOUNTER — Encounter (HOSPITAL_COMMUNITY): Payer: Medicare Other

## 2012-10-31 DIAGNOSIS — I4891 Unspecified atrial fibrillation: Secondary | ICD-10-CM | POA: Diagnosis not present

## 2012-10-31 DIAGNOSIS — Z7901 Long term (current) use of anticoagulants: Secondary | ICD-10-CM | POA: Diagnosis not present

## 2012-10-31 DIAGNOSIS — Z5181 Encounter for therapeutic drug level monitoring: Secondary | ICD-10-CM

## 2012-10-31 DIAGNOSIS — I482 Chronic atrial fibrillation, unspecified: Secondary | ICD-10-CM

## 2012-11-01 ENCOUNTER — Ambulatory Visit (INDEPENDENT_AMBULATORY_CARE_PROVIDER_SITE_OTHER): Payer: Medicare Other | Admitting: Cardiovascular Disease

## 2012-11-01 ENCOUNTER — Encounter: Payer: Self-pay | Admitting: Cardiovascular Disease

## 2012-11-01 VITALS — BP 114/62 | HR 70 | Ht 75.5 in | Wt 198.0 lb

## 2012-11-01 DIAGNOSIS — I4891 Unspecified atrial fibrillation: Secondary | ICD-10-CM

## 2012-11-01 DIAGNOSIS — Z5181 Encounter for therapeutic drug level monitoring: Secondary | ICD-10-CM

## 2012-11-01 DIAGNOSIS — I359 Nonrheumatic aortic valve disorder, unspecified: Secondary | ICD-10-CM

## 2012-11-01 DIAGNOSIS — E785 Hyperlipidemia, unspecified: Secondary | ICD-10-CM

## 2012-11-01 DIAGNOSIS — Z954 Presence of other heart-valve replacement: Secondary | ICD-10-CM | POA: Diagnosis not present

## 2012-11-01 DIAGNOSIS — Z7901 Long term (current) use of anticoagulants: Secondary | ICD-10-CM

## 2012-11-01 DIAGNOSIS — I482 Chronic atrial fibrillation, unspecified: Secondary | ICD-10-CM

## 2012-11-01 NOTE — Progress Notes (Signed)
HPI:  79 year old gentleman presenting for followup evaluation. He has coronary artery disease and initially was treated with CABG in 1979 and then had redo CABG in 1991. He's had a severe ischemic cardiomyopathy with LVEF 25%. He then underwent transcatheter aortic valve replacement at the [facility] clinic in 2012 for treatment of severe symptomatic aortic stenosis. The patient has permanent atrial fibrillation and is maintained on long-term anticoagulation.  The patient is having a lot of problems with leg pain and weakness. He takes Crestor every other day and he clearly associated symptoms with this. The day following Crestor he has the most problems. He stopped taking it a few days ago and symptoms aren't improved. He denies chest pain or chest pressure. He is recovering well from his back problems and feels like his walking is getting better. He has mild dyspnea with exertion. He would like to start doing cardiac rehabilitation to help improve his endurance. He doesn't think he can fly to the [facility] for his followup TAVR evaluation because of his back difficulties.  Outpatient Encounter Prescriptions as of 11/01/2012  Medication Sig Dispense Refill  . acetaminophen (TYLENOL) 325 MG tablet Take 650 mg by mouth every 6 (six) hours as needed for pain or fever.      Marland Kitchen alfuzosin (UROXATRAL) 10 MG 24 hr tablet Take 10 mg by mouth at bedtime.       Marland Kitchen aspirin 81 MG chewable tablet Chew 81 mg by mouth daily.       . carvedilol (COREG) 6.25 MG tablet Take 1 tablet (6.25 mg total) by mouth 2 (two) times daily with a meal.  60 tablet  11  . cyclobenzaprine (FLEXERIL) 5 MG tablet Take 1 tablet (5 mg total) by mouth 3 (three) times daily as needed for muscle spasms.  30 tablet  0  . HYDROcodone-acetaminophen (NORCO/VICODIN) 5-325 MG per tablet Take 1 tablet by mouth every 6 (six) hours as needed for pain.      Marland Kitchen levothyroxine (SYNTHROID, LEVOTHROID) 150 MCG tablet Take 1 tablet (150 mcg total) by  mouth daily.  90 tablet  1  . NITROSTAT 0.4 MG SL tablet DISSOLVE 1 TABLET UNDER TONGUE AS NEEDED FOR CHEST PAIN,MAY REPEAT IN5 MINUTES FOR 2 DOSES.  25 tablet  2  . omeprazole (PRILOSEC) 20 MG capsule Take 20 mg by mouth daily as needed (acid reflux).      . polyethylene glycol (MIRALAX / GLYCOLAX) packet Take 17 g by mouth daily as needed (constipation).      . RESTASIS 0.05 % ophthalmic emulsion Place 1 drop into both eyes every 12 (twelve) hours.       . rosuvastatin (CRESTOR) 5 MG tablet Take 5 mg by mouth daily. Take every other day      . sildenafil (VIAGRA) 100 MG tablet Take 100 mg by mouth daily as needed for erectile dysfunction.      Marland Kitchen warfarin (COUMADIN) 5 MG tablet Take 5 mg by mouth every evening.      . [DISCONTINUED] alendronate (FOSAMAX) 70 MG tablet Take 70 mg by mouth every 7 (seven) days. Take with a full glass of water on an empty stomach.      . [DISCONTINUED] furosemide (LASIX) 20 MG tablet Take 20 mg by mouth as needed.       . [DISCONTINUED] ondansetron (ZOFRAN) 4 MG tablet Take 4 mg by mouth every 8 (eight) hours as needed for nausea.       No facility-administered encounter medications on file as  of 11/01/2012.    Allergies  Allergen Reactions  . Penicillins Shortness Of Breath and Rash  . Ace Inhibitors     Has not tolerated in the past due to hyperkalemia  . Antihistamines, Diphenhydramine-Type Other (See Comments)    Inhibits urination  . Amiodarone     Past Medical History  Diagnosis Date  . Ischemic cardiomyopathy 05/2010    Has EF of 25%  . Diverticulitis     CURRENTLY CONTROLLED WITH NO EVIDENCE OF RECURRENT INFECTION  . AF (atrial fibrillation)     Has not tolerated amiodarone in the past. Amiodarone was stopped in September of 2010 due to side effects  . Chronic anticoagulation     on coumadin  . ICD (implantable cardiac defibrillator) in place   . Prostate cancer   . Osteoarthritis     RIGHT KNEE  . Aortic stenosis, severe     WITH  PERCUTANEOUS AORTIC VALVE (TAVI) IN March 2012 at the [facility]  . MI (myocardial infarction) 1974, 10  . Cervical myelopathy   . Pulmonary embolism   . Hyperlipidemia   . GERD (gastroesophageal reflux disease)   . Esophageal stricture     WITH DILATATION  . NSVT (nonsustained ventricular tachycardia)   . Coronary artery disease   . Stroke   . Heart murmur   . Pacemaker     ROS: Negative except as per HPI  BP 114/62  Pulse 70  Ht 6' 3.5" (1.918 m)  Wt 198 lb (89.812 kg)  BMI 24.41 kg/m2  SpO2 98%  PHYSICAL EXAM: Pt is alert and oriented, elderly appearing gentleman inNAD HEENT: normal Neck: JVP - normal, carotids 2+= without bruits Lungs: course bilaterally CV: irregular with grade 2/6 diastolic decrescendo murmur heard best at the left upper sternal border Abd: soft, NT, Positive BS, no hepatomegaly Ext: no C/C/E, distal pulses intact and equal Skin: warm/dry no rash  EKG:  Atrial fibrillation 70 beats per minute, possible age-indeterminate anterior infarct,  ASSESSMENT AND PLAN: 1.coronary artery disease. The patient is status post redo CABG. He has no anginal symptoms. He seems stable at this time. He's on aspirin for antiplatelet therapy.  2. Aortic valve disease status post transcatheter aortic valve replacement. He has perivalvular aortic insufficiency that is appreciable on his physical exam. Recommend a 2-D echocardiogram.  3. Permanent atrial fibrillation. He continues on warfarin. Overall appears stable.  4. Chronic systolic heart failure. Appears stable on carvedilol.has had some problems with low blood pressures in the past. No lightheadedness or near syncope at this time.  5. Hyperlipidemia. He cannot tolerate Crestor even at an extremely low dose. He's failed low-dose Lipitor in the past as well. He is having problems with gait instability and leg weakness. I think it's best to stop his statin drug at this point. Will repeat lipids in one  month.  Tonny Bollman 3:01 PM 11/01/2012

## 2012-11-01 NOTE — Patient Instructions (Addendum)
Your physician has recommended you make the following change in your medication: Hold Crestor at this time  Your physician has requested that you have an echocardiogram. Echocardiography is a painless test that uses sound waves to create images of your heart. It provides your doctor with information about the size and shape of your heart and how well your heart's chambers and valves are working. This procedure takes approximately one hour. There are no restrictions for this procedure.  Your physician recommends that you return for a FASTING LIPID, LIVER and CK in 1 MONTH--nothing to eat or drink after midnight, lab opens at 7:30  Your physician recommends that you schedule a follow-up appointment in: 4 MONTHS with Dr Excell Seltzer

## 2012-11-04 ENCOUNTER — Ambulatory Visit: Payer: Medicare Other | Attending: Gastroenterology

## 2012-11-04 DIAGNOSIS — IMO0001 Reserved for inherently not codable concepts without codable children: Secondary | ICD-10-CM | POA: Insufficient documentation

## 2012-11-04 DIAGNOSIS — R1313 Dysphagia, pharyngeal phase: Secondary | ICD-10-CM | POA: Diagnosis not present

## 2012-11-05 ENCOUNTER — Ambulatory Visit (HOSPITAL_COMMUNITY): Payer: Medicare Other | Attending: Cardiovascular Disease | Admitting: Radiology

## 2012-11-05 ENCOUNTER — Encounter (HOSPITAL_COMMUNITY): Payer: Medicare Other

## 2012-11-05 DIAGNOSIS — E785 Hyperlipidemia, unspecified: Secondary | ICD-10-CM | POA: Insufficient documentation

## 2012-11-05 DIAGNOSIS — I482 Chronic atrial fibrillation, unspecified: Secondary | ICD-10-CM

## 2012-11-05 DIAGNOSIS — I359 Nonrheumatic aortic valve disorder, unspecified: Secondary | ICD-10-CM | POA: Diagnosis not present

## 2012-11-05 DIAGNOSIS — R011 Cardiac murmur, unspecified: Secondary | ICD-10-CM | POA: Insufficient documentation

## 2012-11-05 DIAGNOSIS — R0609 Other forms of dyspnea: Secondary | ICD-10-CM | POA: Diagnosis not present

## 2012-11-05 DIAGNOSIS — I472 Ventricular tachycardia, unspecified: Secondary | ICD-10-CM | POA: Insufficient documentation

## 2012-11-05 DIAGNOSIS — I4891 Unspecified atrial fibrillation: Secondary | ICD-10-CM | POA: Insufficient documentation

## 2012-11-05 DIAGNOSIS — I2510 Atherosclerotic heart disease of native coronary artery without angina pectoris: Secondary | ICD-10-CM | POA: Insufficient documentation

## 2012-11-05 DIAGNOSIS — Z954 Presence of other heart-valve replacement: Secondary | ICD-10-CM | POA: Insufficient documentation

## 2012-11-05 DIAGNOSIS — Z8673 Personal history of transient ischemic attack (TIA), and cerebral infarction without residual deficits: Secondary | ICD-10-CM | POA: Insufficient documentation

## 2012-11-05 DIAGNOSIS — I080 Rheumatic disorders of both mitral and aortic valves: Secondary | ICD-10-CM | POA: Diagnosis not present

## 2012-11-05 DIAGNOSIS — I2589 Other forms of chronic ischemic heart disease: Secondary | ICD-10-CM | POA: Insufficient documentation

## 2012-11-05 DIAGNOSIS — R0989 Other specified symptoms and signs involving the circulatory and respiratory systems: Secondary | ICD-10-CM | POA: Insufficient documentation

## 2012-11-05 DIAGNOSIS — I4729 Other ventricular tachycardia: Secondary | ICD-10-CM | POA: Insufficient documentation

## 2012-11-05 DIAGNOSIS — Z7901 Long term (current) use of anticoagulants: Secondary | ICD-10-CM

## 2012-11-05 DIAGNOSIS — I255 Ischemic cardiomyopathy: Secondary | ICD-10-CM

## 2012-11-05 NOTE — Progress Notes (Signed)
Echocardiogram performed.  

## 2012-11-06 ENCOUNTER — Other Ambulatory Visit: Payer: Self-pay | Admitting: Cardiology

## 2012-11-06 ENCOUNTER — Telehealth: Payer: Self-pay | Admitting: *Deleted

## 2012-11-06 ENCOUNTER — Encounter (HOSPITAL_COMMUNITY): Payer: Medicare Other

## 2012-11-06 NOTE — Telephone Encounter (Signed)
Patient called in requesting Clindamycin to take prior to a dental procedure today. [name], reviewed his chart and said that it was ok to fill. Patient notified.

## 2012-11-07 ENCOUNTER — Encounter: Payer: Self-pay | Admitting: Cardiovascular Disease

## 2012-11-07 ENCOUNTER — Encounter (HOSPITAL_COMMUNITY): Payer: Medicare Other

## 2012-11-07 DIAGNOSIS — H9120 Sudden idiopathic hearing loss, unspecified ear: Secondary | ICD-10-CM | POA: Diagnosis not present

## 2012-11-07 DIAGNOSIS — J329 Chronic sinusitis, unspecified: Secondary | ICD-10-CM | POA: Diagnosis not present

## 2012-11-07 DIAGNOSIS — H905 Unspecified sensorineural hearing loss: Secondary | ICD-10-CM | POA: Diagnosis not present

## 2012-11-07 NOTE — Telephone Encounter (Signed)
New Prob ° ° ° °Pt returning call from earlier. Please call. °

## 2012-11-07 NOTE — Telephone Encounter (Signed)
This encounter was created in error - please disregard.

## 2012-11-12 ENCOUNTER — Encounter (HOSPITAL_COMMUNITY): Payer: Medicare Other

## 2012-11-12 ENCOUNTER — Encounter: Payer: Self-pay | Admitting: *Deleted

## 2012-11-13 ENCOUNTER — Ambulatory Visit: Payer: Medicare Other | Attending: Gastroenterology

## 2012-11-13 ENCOUNTER — Encounter (HOSPITAL_COMMUNITY): Payer: Medicare Other

## 2012-11-13 ENCOUNTER — Ambulatory Visit: Payer: Medicare Other

## 2012-11-13 DIAGNOSIS — R1313 Dysphagia, pharyngeal phase: Secondary | ICD-10-CM | POA: Diagnosis not present

## 2012-11-13 DIAGNOSIS — IMO0001 Reserved for inherently not codable concepts without codable children: Secondary | ICD-10-CM | POA: Insufficient documentation

## 2012-11-14 ENCOUNTER — Ambulatory Visit (INDEPENDENT_AMBULATORY_CARE_PROVIDER_SITE_OTHER): Payer: Medicare Other | Admitting: Cardiovascular Disease

## 2012-11-14 ENCOUNTER — Encounter (HOSPITAL_COMMUNITY): Payer: Medicare Other

## 2012-11-14 DIAGNOSIS — I4891 Unspecified atrial fibrillation: Secondary | ICD-10-CM

## 2012-11-14 DIAGNOSIS — Z5181 Encounter for therapeutic drug level monitoring: Secondary | ICD-10-CM

## 2012-11-14 DIAGNOSIS — Z7901 Long term (current) use of anticoagulants: Secondary | ICD-10-CM

## 2012-11-14 DIAGNOSIS — I482 Chronic atrial fibrillation, unspecified: Secondary | ICD-10-CM

## 2012-11-14 LAB — POCT INR: INR: 2.7

## 2012-11-15 ENCOUNTER — Ambulatory Visit: Payer: Medicare Other

## 2012-11-18 ENCOUNTER — Ambulatory Visit (INDEPENDENT_AMBULATORY_CARE_PROVIDER_SITE_OTHER): Payer: Medicare Other | Admitting: *Deleted

## 2012-11-18 DIAGNOSIS — I255 Ischemic cardiomyopathy: Secondary | ICD-10-CM

## 2012-11-18 DIAGNOSIS — I2589 Other forms of chronic ischemic heart disease: Secondary | ICD-10-CM

## 2012-11-18 DIAGNOSIS — Z95810 Presence of automatic (implantable) cardiac defibrillator: Secondary | ICD-10-CM | POA: Diagnosis not present

## 2012-11-19 ENCOUNTER — Encounter (HOSPITAL_COMMUNITY): Payer: Medicare Other

## 2012-11-20 ENCOUNTER — Encounter (HOSPITAL_COMMUNITY): Payer: Medicare Other

## 2012-11-20 ENCOUNTER — Other Ambulatory Visit: Payer: Self-pay | Admitting: Cardiovascular Disease

## 2012-11-20 ENCOUNTER — Ambulatory Visit: Payer: Medicare Other

## 2012-11-21 ENCOUNTER — Encounter (HOSPITAL_COMMUNITY): Payer: Medicare Other

## 2012-11-26 ENCOUNTER — Ambulatory Visit: Payer: Medicare Other | Admitting: Speech Pathology

## 2012-11-26 ENCOUNTER — Encounter (HOSPITAL_COMMUNITY): Payer: Medicare Other

## 2012-11-27 ENCOUNTER — Encounter (HOSPITAL_COMMUNITY): Payer: Medicare Other

## 2012-11-27 LAB — REMOTE ICD DEVICE
BRDY-0002RV: 40 {beats}/min
CHARGE TIME: 9.69 s
DEV-0020ICD: NEGATIVE
RV LEAD AMPLITUDE: 14.9 mv
TOT-0006: 20140806000000
TZAT-0001FASTVT: 1
TZAT-0011SLOWVT: 10 ms
TZAT-0012SLOWVT: 200 ms
TZAT-0012SLOWVT: 200 ms
TZAT-0013FASTVT: 1
TZAT-0013SLOWVT: 2
TZAT-0018FASTVT: NEGATIVE
TZAT-0018SLOWVT: NEGATIVE
TZAT-0018SLOWVT: NEGATIVE
TZAT-0019FASTVT: 8 V
TZAT-0019SLOWVT: 8 V
TZAT-0019SLOWVT: 8 V
TZAT-0020FASTVT: 1.6 ms
TZAT-0020SLOWVT: 1.6 ms
TZAT-0020SLOWVT: 1.6 ms
TZON-0003SLOWVT: 400 ms
TZON-0005SLOWVT: 12
TZON-0008FASTVT: 0 ms
TZON-0008SLOWVT: 0 ms
TZON-0011AFLUTTER: 70
TZST-0001FASTVT: 2
TZST-0001FASTVT: 4
TZST-0001SLOWVT: 4
TZST-0001SLOWVT: 5
TZST-0003FASTVT: 35 J
TZST-0003FASTVT: 35 J
TZST-0003FASTVT: 35 J
TZST-0003SLOWVT: 22 J
TZST-0003SLOWVT: 35 J
TZST-0003SLOWVT: 5 J
VENTRICULAR PACING ICD: 3 pct

## 2012-11-28 ENCOUNTER — Ambulatory Visit (INDEPENDENT_AMBULATORY_CARE_PROVIDER_SITE_OTHER): Payer: Medicare Other | Admitting: Cardiology

## 2012-11-28 ENCOUNTER — Encounter (HOSPITAL_COMMUNITY): Payer: Medicare Other

## 2012-11-28 DIAGNOSIS — Z7901 Long term (current) use of anticoagulants: Secondary | ICD-10-CM

## 2012-11-28 DIAGNOSIS — Z5181 Encounter for therapeutic drug level monitoring: Secondary | ICD-10-CM

## 2012-11-28 DIAGNOSIS — H905 Unspecified sensorineural hearing loss: Secondary | ICD-10-CM | POA: Diagnosis not present

## 2012-11-28 DIAGNOSIS — I482 Chronic atrial fibrillation, unspecified: Secondary | ICD-10-CM

## 2012-11-28 DIAGNOSIS — I4891 Unspecified atrial fibrillation: Secondary | ICD-10-CM

## 2012-11-29 ENCOUNTER — Ambulatory Visit: Payer: Medicare Other

## 2012-12-03 ENCOUNTER — Encounter (HOSPITAL_COMMUNITY): Payer: Medicare Other

## 2012-12-03 ENCOUNTER — Other Ambulatory Visit (INDEPENDENT_AMBULATORY_CARE_PROVIDER_SITE_OTHER): Payer: Medicare Other

## 2012-12-03 ENCOUNTER — Ambulatory Visit: Payer: Medicare Other

## 2012-12-03 DIAGNOSIS — E785 Hyperlipidemia, unspecified: Secondary | ICD-10-CM

## 2012-12-03 DIAGNOSIS — Z5181 Encounter for therapeutic drug level monitoring: Secondary | ICD-10-CM | POA: Diagnosis not present

## 2012-12-03 DIAGNOSIS — I4891 Unspecified atrial fibrillation: Secondary | ICD-10-CM | POA: Diagnosis not present

## 2012-12-03 DIAGNOSIS — Z7901 Long term (current) use of anticoagulants: Secondary | ICD-10-CM

## 2012-12-03 DIAGNOSIS — I482 Chronic atrial fibrillation, unspecified: Secondary | ICD-10-CM

## 2012-12-03 LAB — LIPID PANEL
Cholesterol: 191 mg/dL (ref 0–200)
HDL: 46.9 mg/dL (ref >39.00)
LDL Cholesterol: 133 mg/dL — ABNORMAL HIGH (ref 0–99)
Total CHOL/HDL Ratio: 4
Triglycerides: 58 mg/dL (ref 0.0–149.0)

## 2012-12-03 LAB — HEPATIC FUNCTION PANEL
Albumin: 3.7 g/dL (ref 3.5–5.2)
Alkaline Phosphatase: 65 U/L (ref 39–117)
Total Protein: 6.4 g/dL (ref 6.0–8.3)

## 2012-12-03 LAB — CK: Total CK: 53 U/L (ref 7–232)

## 2012-12-04 ENCOUNTER — Encounter (HOSPITAL_COMMUNITY): Payer: Medicare Other

## 2012-12-05 ENCOUNTER — Encounter (HOSPITAL_COMMUNITY): Payer: Medicare Other

## 2012-12-05 ENCOUNTER — Other Ambulatory Visit: Payer: Self-pay | Admitting: *Deleted

## 2012-12-05 DIAGNOSIS — I359 Nonrheumatic aortic valve disorder, unspecified: Secondary | ICD-10-CM

## 2012-12-05 MED ORDER — CARVEDILOL 6.25 MG PO TABS: 6.2500 mg | ORAL_TABLET | Freq: Two times a day (BID) | ORAL | Status: DC

## 2012-12-10 ENCOUNTER — Encounter (HOSPITAL_COMMUNITY): Payer: Medicare Other

## 2012-12-10 ENCOUNTER — Ambulatory Visit: Payer: Medicare Other

## 2012-12-12 ENCOUNTER — Ambulatory Visit (INDEPENDENT_AMBULATORY_CARE_PROVIDER_SITE_OTHER): Payer: Medicare Other | Admitting: Cardiovascular Disease

## 2012-12-12 DIAGNOSIS — Z5181 Encounter for therapeutic drug level monitoring: Secondary | ICD-10-CM

## 2012-12-12 DIAGNOSIS — I4891 Unspecified atrial fibrillation: Secondary | ICD-10-CM

## 2012-12-12 DIAGNOSIS — Z7901 Long term (current) use of anticoagulants: Secondary | ICD-10-CM

## 2012-12-12 DIAGNOSIS — I482 Chronic atrial fibrillation, unspecified: Secondary | ICD-10-CM

## 2012-12-13 ENCOUNTER — Ambulatory Visit (INDEPENDENT_AMBULATORY_CARE_PROVIDER_SITE_OTHER): Payer: Medicare Other | Admitting: Internal Medicine

## 2012-12-13 ENCOUNTER — Encounter: Payer: Self-pay | Admitting: Internal Medicine

## 2012-12-13 VITALS — BP 102/64 | HR 65 | Ht 75.5 in | Wt 198.0 lb

## 2012-12-13 DIAGNOSIS — Z95810 Presence of automatic (implantable) cardiac defibrillator: Secondary | ICD-10-CM | POA: Diagnosis not present

## 2012-12-13 DIAGNOSIS — I472 Ventricular tachycardia: Secondary | ICD-10-CM

## 2012-12-13 DIAGNOSIS — I4891 Unspecified atrial fibrillation: Secondary | ICD-10-CM | POA: Diagnosis not present

## 2012-12-13 DIAGNOSIS — I2589 Other forms of chronic ischemic heart disease: Secondary | ICD-10-CM

## 2012-12-13 DIAGNOSIS — I255 Ischemic cardiomyopathy: Secondary | ICD-10-CM

## 2012-12-13 DIAGNOSIS — I482 Chronic atrial fibrillation, unspecified: Secondary | ICD-10-CM

## 2012-12-13 NOTE — Assessment & Plan Note (Signed)
Stable. We'll continue Coumadin. He is on aspirin adjunctively because of his aortic valve

## 2012-12-13 NOTE — Patient Instructions (Addendum)
Your physician has recommended that you have a defibrillator generator change. This is done in the hospital and usually requires and overnight stay. We will be in touch to schedule this procedure with you.  Any questions/concerns please call Dory Horn RN (Dr. Odessa Fleming nurse)   Your physician recommends that you continue on your current medications as directed. Please refer to the Current Medication list given to you today.

## 2012-12-13 NOTE — Assessment & Plan Note (Signed)
The patient's device was interrogated.  The information was reviewed. No changes were made in the programming.   We have reviewed the benefits and risks of generator replacement.  These include but are not limited to lead fracture and infection.  The patient understands, agrees and is willing to proceed.    

## 2012-12-13 NOTE — Progress Notes (Signed)
Patient Care Team: Gwen Pounds, MD as PCP - General (Internal Medicine)   HPI  [name] is a 79 y.o. male seen in followup for ischemic cardiomyopathy for which he underwent ICD implantation for primary prevention. He has had appropriate therapy for ventricular tachycardia.  He is status post bypass surgery.  He alo underwent percutaneous aortic valve replacement. He has done [employer].  He also has a history of atrial fibrillation for which amiodarone was initiated. He was intolerant of this and this has been discontinued. His atrial fibrillation is now permanent. He is on warfarin  His device has reached ERI  He had a fall in the spring when he broke his back. It was likely syncopal. His device was interrogated and demonstrated no arrhythmia.      Past Medical History  Diagnosis Date  . Ischemic cardiomyopathy 05/2010    Has EF of 25%  . Diverticulitis     CURRENTLY CONTROLLED WITH NO EVIDENCE OF RECURRENT INFECTION  . AF (atrial fibrillation)     Has not tolerated amiodarone in the past. Amiodarone was stopped in September of 2010 due to side effects  . Chronic anticoagulation     on coumadin  . ICD (implantable cardiac defibrillator) in place   . Prostate cancer   . Osteoarthritis     RIGHT KNEE  . Aortic stenosis, severe     WITH PERCUTANEOUS AORTIC VALVE (TAVI) IN March 2012 at the [facility]  . MI (myocardial infarction) 1974, 75  . Cervical myelopathy   . Pulmonary embolism   . Hyperlipidemia   . GERD (gastroesophageal reflux disease)   . Esophageal stricture     WITH DILATATION  . NSVT (nonsustained ventricular tachycardia)   . Coronary artery disease   . Stroke   . Heart murmur   . Pacemaker     Past Surgical History  Procedure Laterality Date  . Icd  Feb 2003  . Coronary artery bypass graft  1979  . Coronary artery bypass graft  1991    REDO SURGERY  . Cardiac catheterization  2010    SEVERE LV DYSFUNCTION WITH ESTIMATED EJECTION  FRACTION OF 25%  . Cholecystectomy    . Aortic valve replacement      Percutaneous AVR in March 2012 at the [facility]  . Cardioversion  02/16/2011    Procedure: CARDIOVERSION;  Surgeon: Luis Abed, MD;  Location: [facility] OR;  Service: Cardiovascular;  Laterality: N/A;  . Shoulder surgery    . Pacemaker placement      Current Outpatient Prescriptions  Medication Sig Dispense Refill  . acetaminophen (TYLENOL) 325 MG tablet Take 650 mg by mouth every 6 (six) hours as needed for pain or fever.      Marland Kitchen alfuzosin (UROXATRAL) 10 MG 24 hr tablet Take 10 mg by mouth at bedtime.       Marland Kitchen aspirin 81 MG chewable tablet Chew 81 mg by mouth daily.       . carvedilol (COREG) 6.25 MG tablet Take 1 tablet (6.25 mg total) by mouth 2 (two) times daily with a meal.  60 tablet  3  . clindamycin (CLEOCIN) 150 MG capsule TAKE 4 CAPSULES (600MG ) 60 MINUTES BEFORE DENTAL PROCEDURE.  8 capsule  0  . COUMADIN 5 MG tablet TAKE AS DIRECTED.  90 tablet  1  . cyclobenzaprine (FLEXERIL) 5 MG tablet Take 1 tablet (5 mg total) by mouth 3 (three) times daily as needed for muscle spasms.  30 tablet  0  .  levothyroxine (SYNTHROID, LEVOTHROID) 150 MCG tablet Take 1 tablet (150 mcg total) by mouth daily.  90 tablet  1  . NITROSTAT 0.4 MG SL tablet DISSOLVE 1 TABLET UNDER TONGUE AS NEEDED FOR CHEST PAIN,MAY REPEAT IN5 MINUTES FOR 2 DOSES.  25 tablet  2  . omeprazole (PRILOSEC) 20 MG capsule Take 20 mg by mouth daily as needed (acid reflux).      . polyethylene glycol (MIRALAX / GLYCOLAX) packet Take 17 g by mouth daily as needed (constipation).      . RESTASIS 0.05 % ophthalmic emulsion Place 1 drop into both eyes every 12 (twelve) hours.       . sildenafil (VIAGRA) 100 MG tablet Take 100 mg by mouth daily as needed for erectile dysfunction.       No current facility-administered medications for this visit.    Allergies  Allergen Reactions  . Penicillins Shortness Of Breath and Rash  . Ace Inhibitors     Has not  tolerated in the past due to hyperkalemia  . Antihistamines, Diphenhydramine-Type Other (See Comments)    Inhibits urination  . Amiodarone     Review of Systems negative except from HPI and PMH  Physical Exam BP 102/64  Pulse 65  Ht 6' 3.5" (1.918 m)  Wt 198 lb (89.812 kg)  BMI 24.41 kg/m2 Well developed and well nourished in no acute distress HENT normal E scleral and icterus clear Neck Supple JVP flat; carotids brisk and full Clear to ausculation  Device pocket well healed; without hematoma or erythema.  There is no tethering   Regular rate and rhythm, 2/6 murmur Soft with active bowel sounds No clubbing cyanosis none Edema Alert and oriented, grossly normal motor and sensory function Skin Warm and Dry  ECG demonstrates atrial fibrillation was infrequent ventricular pacing  Assessment and  Plan

## 2012-12-13 NOTE — Assessment & Plan Note (Signed)
No intercurrent ventricular tachycardia 

## 2012-12-13 NOTE — Assessment & Plan Note (Signed)
On low-dose correlate; blood pressure precludes ACE inhibitors

## 2012-12-17 ENCOUNTER — Encounter: Payer: Self-pay | Admitting: Internal Medicine

## 2012-12-23 DIAGNOSIS — C61 Malignant neoplasm of prostate: Secondary | ICD-10-CM | POA: Diagnosis not present

## 2012-12-23 DIAGNOSIS — N320 Bladder-neck obstruction: Secondary | ICD-10-CM | POA: Diagnosis not present

## 2012-12-23 DIAGNOSIS — R300 Dysuria: Secondary | ICD-10-CM | POA: Diagnosis not present

## 2012-12-26 ENCOUNTER — Ambulatory Visit (INDEPENDENT_AMBULATORY_CARE_PROVIDER_SITE_OTHER): Payer: Medicare Other | Admitting: Internal Medicine

## 2012-12-26 DIAGNOSIS — Z7901 Long term (current) use of anticoagulants: Secondary | ICD-10-CM | POA: Diagnosis not present

## 2012-12-26 DIAGNOSIS — C61 Malignant neoplasm of prostate: Secondary | ICD-10-CM | POA: Diagnosis not present

## 2012-12-26 DIAGNOSIS — I4891 Unspecified atrial fibrillation: Secondary | ICD-10-CM

## 2012-12-26 DIAGNOSIS — N320 Bladder-neck obstruction: Secondary | ICD-10-CM | POA: Diagnosis not present

## 2012-12-26 DIAGNOSIS — I482 Chronic atrial fibrillation, unspecified: Secondary | ICD-10-CM

## 2012-12-26 DIAGNOSIS — Z5181 Encounter for therapeutic drug level monitoring: Secondary | ICD-10-CM

## 2012-12-26 LAB — POCT INR: INR: 1.9

## 2013-01-02 DIAGNOSIS — Z23 Encounter for immunization: Secondary | ICD-10-CM | POA: Diagnosis not present

## 2013-01-09 ENCOUNTER — Ambulatory Visit (INDEPENDENT_AMBULATORY_CARE_PROVIDER_SITE_OTHER): Payer: Medicare Other | Admitting: Cardiology

## 2013-01-09 DIAGNOSIS — I482 Chronic atrial fibrillation, unspecified: Secondary | ICD-10-CM

## 2013-01-09 DIAGNOSIS — Z7901 Long term (current) use of anticoagulants: Secondary | ICD-10-CM

## 2013-01-09 DIAGNOSIS — Z5181 Encounter for therapeutic drug level monitoring: Secondary | ICD-10-CM

## 2013-01-09 DIAGNOSIS — I4891 Unspecified atrial fibrillation: Secondary | ICD-10-CM

## 2013-01-09 LAB — POCT INR: INR: 2.3

## 2013-01-16 ENCOUNTER — Other Ambulatory Visit: Payer: Self-pay

## 2013-01-16 ENCOUNTER — Telehealth: Payer: Self-pay | Admitting: Internal Medicine

## 2013-01-16 NOTE — Telephone Encounter (Signed)
Spoke w/ pt about getting a generator change. Sherri will call him tomorrow to set up an appt for the lab.

## 2013-01-16 NOTE — Telephone Encounter (Signed)
New message    Pt says he needs his batteries replaced and he thought he had an appt for 11-25 with Dr Graciela Husbands.  This appt has been cancelled and he want to know why and should it be resch?

## 2013-01-17 ENCOUNTER — Telehealth: Payer: Self-pay | Admitting: *Deleted

## 2013-01-17 NOTE — Telephone Encounter (Signed)
Follow Up  Pt returned call to schedule an appt at the hospital

## 2013-01-17 NOTE — Telephone Encounter (Signed)
Left message to call back to schedule ICD generator change. 

## 2013-01-20 ENCOUNTER — Other Ambulatory Visit: Payer: Self-pay | Admitting: *Deleted

## 2013-01-20 ENCOUNTER — Encounter: Payer: Self-pay | Admitting: *Deleted

## 2013-01-20 DIAGNOSIS — I4891 Unspecified atrial fibrillation: Secondary | ICD-10-CM

## 2013-01-20 NOTE — Telephone Encounter (Signed)
Follow Up   Pt returned call to schedule ICD generator change//please call

## 2013-01-20 NOTE — Telephone Encounter (Signed)
Set up ICD Generator change for 02/10/2013 at 7:30am, patient to arrive at hospital at 5:30am. Procedure details discussed and instructions given. Pre-procedure lab work scheduled for 01/31/13. Letter of instruction left at front desk for patient to pick up when he comes in for lab work next week. Patient verbalized understanding of instructions and agreeable to plan.

## 2013-01-24 ENCOUNTER — Ambulatory Visit (INDEPENDENT_AMBULATORY_CARE_PROVIDER_SITE_OTHER): Payer: Medicare Other | Admitting: Internal Medicine

## 2013-01-24 DIAGNOSIS — Z7901 Long term (current) use of anticoagulants: Secondary | ICD-10-CM

## 2013-01-24 DIAGNOSIS — I4891 Unspecified atrial fibrillation: Secondary | ICD-10-CM | POA: Diagnosis not present

## 2013-01-24 DIAGNOSIS — Z5181 Encounter for therapeutic drug level monitoring: Secondary | ICD-10-CM

## 2013-01-24 DIAGNOSIS — I482 Chronic atrial fibrillation, unspecified: Secondary | ICD-10-CM

## 2013-01-27 ENCOUNTER — Encounter (HOSPITAL_COMMUNITY): Payer: Self-pay | Admitting: Pharmacy Technician

## 2013-01-28 DIAGNOSIS — L738 Other specified follicular disorders: Secondary | ICD-10-CM | POA: Diagnosis not present

## 2013-01-28 DIAGNOSIS — Z85828 Personal history of other malignant neoplasm of skin: Secondary | ICD-10-CM | POA: Diagnosis not present

## 2013-01-28 DIAGNOSIS — L578 Other skin changes due to chronic exposure to nonionizing radiation: Secondary | ICD-10-CM | POA: Diagnosis not present

## 2013-01-30 DIAGNOSIS — M79609 Pain in unspecified limb: Secondary | ICD-10-CM | POA: Diagnosis not present

## 2013-01-30 DIAGNOSIS — T148XXA Other injury of unspecified body region, initial encounter: Secondary | ICD-10-CM | POA: Diagnosis not present

## 2013-01-30 DIAGNOSIS — Z6825 Body mass index (BMI) 25.0-25.9, adult: Secondary | ICD-10-CM | POA: Diagnosis not present

## 2013-01-31 ENCOUNTER — Other Ambulatory Visit (INDEPENDENT_AMBULATORY_CARE_PROVIDER_SITE_OTHER): Payer: Medicare Other

## 2013-01-31 DIAGNOSIS — I4891 Unspecified atrial fibrillation: Secondary | ICD-10-CM | POA: Diagnosis not present

## 2013-01-31 LAB — CBC WITH DIFFERENTIAL/PLATELET
Basophils Relative: 0.4 % (ref 0.0–3.0)
Eosinophils Relative: 5.1 % — ABNORMAL HIGH (ref 0.0–5.0)
Hemoglobin: 13.9 g/dL (ref 13.0–17.0)
Lymphocytes Relative: 16.2 % (ref 12.0–46.0)
MCHC: 33.8 g/dL (ref 30.0–36.0)
Monocytes Relative: 9.1 % (ref 3.0–12.0)
Neutro Abs: 3.6 10*3/uL (ref 1.4–7.7)
RBC: 4.57 Mil/uL (ref 4.22–5.81)
WBC: 5.2 10*3/uL (ref 4.5–10.5)

## 2013-01-31 LAB — BASIC METABOLIC PANEL
CO2: 29 mEq/L (ref 19–32)
Calcium: 8.8 mg/dL (ref 8.4–10.5)
Sodium: 132 mEq/L — ABNORMAL LOW (ref 135–145)

## 2013-02-04 ENCOUNTER — Encounter: Payer: Medicare Other | Admitting: Internal Medicine

## 2013-02-05 ENCOUNTER — Telehealth: Payer: Self-pay | Admitting: *Deleted

## 2013-02-05 NOTE — Telephone Encounter (Signed)
Notified patient to continue taking his Coumadin for procedure on 12/01, per Dr Graciela Husbands. Patient agreeable to plan.

## 2013-02-07 ENCOUNTER — Ambulatory Visit (INDEPENDENT_AMBULATORY_CARE_PROVIDER_SITE_OTHER): Payer: Medicare Other | Admitting: Cardiology

## 2013-02-07 DIAGNOSIS — Z5181 Encounter for therapeutic drug level monitoring: Secondary | ICD-10-CM

## 2013-02-07 DIAGNOSIS — Z7901 Long term (current) use of anticoagulants: Secondary | ICD-10-CM

## 2013-02-07 DIAGNOSIS — I4891 Unspecified atrial fibrillation: Secondary | ICD-10-CM

## 2013-02-07 DIAGNOSIS — I482 Chronic atrial fibrillation, unspecified: Secondary | ICD-10-CM

## 2013-02-09 ENCOUNTER — Other Ambulatory Visit: Payer: Self-pay | Admitting: Cardiology

## 2013-02-09 DIAGNOSIS — Z951 Presence of aortocoronary bypass graft: Secondary | ICD-10-CM | POA: Diagnosis not present

## 2013-02-09 DIAGNOSIS — J3489 Other specified disorders of nose and nasal sinuses: Secondary | ICD-10-CM | POA: Diagnosis not present

## 2013-02-09 DIAGNOSIS — Z45018 Encounter for adjustment and management of other part of cardiac pacemaker: Secondary | ICD-10-CM | POA: Diagnosis not present

## 2013-02-09 DIAGNOSIS — Z954 Presence of other heart-valve replacement: Secondary | ICD-10-CM | POA: Diagnosis not present

## 2013-02-09 DIAGNOSIS — M4712 Other spondylosis with myelopathy, cervical region: Secondary | ICD-10-CM | POA: Diagnosis not present

## 2013-02-09 DIAGNOSIS — I2510 Atherosclerotic heart disease of native coronary artery without angina pectoris: Secondary | ICD-10-CM | POA: Diagnosis not present

## 2013-02-09 DIAGNOSIS — I4891 Unspecified atrial fibrillation: Secondary | ICD-10-CM | POA: Diagnosis not present

## 2013-02-09 DIAGNOSIS — C61 Malignant neoplasm of prostate: Secondary | ICD-10-CM | POA: Diagnosis not present

## 2013-02-09 DIAGNOSIS — M1990 Unspecified osteoarthritis, unspecified site: Secondary | ICD-10-CM | POA: Diagnosis not present

## 2013-02-09 DIAGNOSIS — K5732 Diverticulitis of large intestine without perforation or abscess without bleeding: Secondary | ICD-10-CM | POA: Diagnosis not present

## 2013-02-09 DIAGNOSIS — E785 Hyperlipidemia, unspecified: Secondary | ICD-10-CM | POA: Diagnosis not present

## 2013-02-09 DIAGNOSIS — I252 Old myocardial infarction: Secondary | ICD-10-CM | POA: Diagnosis not present

## 2013-02-09 DIAGNOSIS — K219 Gastro-esophageal reflux disease without esophagitis: Secondary | ICD-10-CM | POA: Diagnosis not present

## 2013-02-09 DIAGNOSIS — Z86711 Personal history of pulmonary embolism: Secondary | ICD-10-CM | POA: Diagnosis not present

## 2013-02-09 DIAGNOSIS — Z7901 Long term (current) use of anticoagulants: Secondary | ICD-10-CM | POA: Diagnosis not present

## 2013-02-09 DIAGNOSIS — I2589 Other forms of chronic ischemic heart disease: Secondary | ICD-10-CM | POA: Diagnosis not present

## 2013-02-09 MED ORDER — VANCOMYCIN HCL IN DEXTROSE 1-5 GM/200ML-% IV SOLN
1000.0000 mg | INTRAVENOUS | Status: DC
  Filled 2013-02-09: qty 200

## 2013-02-09 MED ORDER — SODIUM CHLORIDE 0.9 % IR SOLN
80.0000 mg | Status: DC
  Filled 2013-02-09: qty 2

## 2013-02-10 ENCOUNTER — Encounter (HOSPITAL_COMMUNITY)
Admission: RE | Disposition: A | Discharge status: Home / Self Care | Payer: Self-pay | Source: Ambulatory Visit | Attending: Internal Medicine

## 2013-02-10 ENCOUNTER — Ambulatory Visit (HOSPITAL_COMMUNITY)
Admission: RE | Admit: 2013-02-10 | Discharge: 2013-02-10 | Disposition: A | Discharge status: Home / Self Care | Payer: Medicare Other | Source: Ambulatory Visit | Attending: Internal Medicine | Admitting: Internal Medicine

## 2013-02-10 DIAGNOSIS — Z951 Presence of aortocoronary bypass graft: Secondary | ICD-10-CM | POA: Insufficient documentation

## 2013-02-10 DIAGNOSIS — Z86711 Personal history of pulmonary embolism: Secondary | ICD-10-CM | POA: Insufficient documentation

## 2013-02-10 DIAGNOSIS — Z7901 Long term (current) use of anticoagulants: Secondary | ICD-10-CM | POA: Insufficient documentation

## 2013-02-10 DIAGNOSIS — I2589 Other forms of chronic ischemic heart disease: Secondary | ICD-10-CM | POA: Insufficient documentation

## 2013-02-10 DIAGNOSIS — M4712 Other spondylosis with myelopathy, cervical region: Secondary | ICD-10-CM | POA: Insufficient documentation

## 2013-02-10 DIAGNOSIS — I2510 Atherosclerotic heart disease of native coronary artery without angina pectoris: Secondary | ICD-10-CM | POA: Insufficient documentation

## 2013-02-10 DIAGNOSIS — Z954 Presence of other heart-valve replacement: Secondary | ICD-10-CM | POA: Insufficient documentation

## 2013-02-10 DIAGNOSIS — K219 Gastro-esophageal reflux disease without esophagitis: Secondary | ICD-10-CM | POA: Insufficient documentation

## 2013-02-10 DIAGNOSIS — M1990 Unspecified osteoarthritis, unspecified site: Secondary | ICD-10-CM | POA: Insufficient documentation

## 2013-02-10 DIAGNOSIS — Z45018 Encounter for adjustment and management of other part of cardiac pacemaker: Secondary | ICD-10-CM | POA: Insufficient documentation

## 2013-02-10 DIAGNOSIS — C61 Malignant neoplasm of prostate: Secondary | ICD-10-CM | POA: Insufficient documentation

## 2013-02-10 DIAGNOSIS — K5732 Diverticulitis of large intestine without perforation or abscess without bleeding: Secondary | ICD-10-CM | POA: Insufficient documentation

## 2013-02-10 DIAGNOSIS — I252 Old myocardial infarction: Secondary | ICD-10-CM | POA: Insufficient documentation

## 2013-02-10 DIAGNOSIS — E785 Hyperlipidemia, unspecified: Secondary | ICD-10-CM | POA: Insufficient documentation

## 2013-02-10 DIAGNOSIS — J3489 Other specified disorders of nose and nasal sinuses: Secondary | ICD-10-CM | POA: Insufficient documentation

## 2013-02-10 DIAGNOSIS — I4891 Unspecified atrial fibrillation: Secondary | ICD-10-CM | POA: Insufficient documentation

## 2013-02-10 HISTORY — PX: IMPLANTABLE CARDIOVERTER DEFIBRILLATOR (ICD) GENERATOR CHANGE: SHX5469

## 2013-02-10 LAB — PROTIME-INR
INR: 2.14 — ABNORMAL HIGH (ref 0.00–1.49)
Prothrombin Time: 23.2 seconds — ABNORMAL HIGH (ref 11.6–15.2)

## 2013-02-10 LAB — SURGICAL PCR SCREEN
MRSA, PCR: NEGATIVE
Staphylococcus aureus: NEGATIVE

## 2013-02-10 SURGERY — ICD GENERATOR CHANGE
Anesthesia: LOCAL

## 2013-02-10 MED ORDER — FENTANYL CITRATE 0.05 MG/ML IJ SOLN
INTRAMUSCULAR | Status: AC
  Filled 2013-02-10: qty 2

## 2013-02-10 MED ORDER — ONDANSETRON HCL 4 MG/2ML IJ SOLN: 4.0000 mg | Freq: Four times a day (QID) | INTRAMUSCULAR | Status: DC | PRN

## 2013-02-10 MED ORDER — OXYMETAZOLINE HCL 0.05 % NA SOLN: 1.0000 | Freq: Two times a day (BID) | NASAL | Status: DC

## 2013-02-10 MED ORDER — LIDOCAINE HCL (PF) 1 % IJ SOLN
INTRAMUSCULAR | Status: AC
  Filled 2013-02-10: qty 60

## 2013-02-10 MED ORDER — SODIUM CHLORIDE 0.9 % IV SOLN
INTRAVENOUS | Status: DC
  Administered 2013-02-10: 07:00:00 via INTRAVENOUS

## 2013-02-10 MED ORDER — ACETAMINOPHEN 325 MG PO TABS
325.0000 mg | ORAL_TABLET | ORAL | Status: DC | PRN
  Filled 2013-02-10: qty 2

## 2013-02-10 MED ORDER — OXYMETAZOLINE HCL 0.05 % NA SOLN
1.0000 | Freq: Two times a day (BID) | NASAL | Status: DC
  Filled 2013-02-10: qty 15

## 2013-02-10 MED ORDER — MIDAZOLAM HCL 5 MG/5ML IJ SOLN
INTRAMUSCULAR | Status: AC
  Filled 2013-02-10: qty 5

## 2013-02-10 MED ORDER — MUPIROCIN 2 % EX OINT
TOPICAL_OINTMENT | CUTANEOUS | Status: AC
  Filled 2013-02-10: qty 22

## 2013-02-10 MED ORDER — CHLORHEXIDINE GLUCONATE 4 % EX LIQD
60.0000 mL | Freq: Once | CUTANEOUS | Status: DC
  Filled 2013-02-10: qty 60

## 2013-02-10 MED ORDER — MUPIROCIN 2 % EX OINT
TOPICAL_OINTMENT | Freq: Two times a day (BID) | CUTANEOUS | Status: DC
  Administered 2013-02-10: 1 via NASAL
  Filled 2013-02-10: qty 22

## 2013-02-10 MED ORDER — SODIUM CHLORIDE 0.9 % IV SOLN: INTRAVENOUS | Status: DC

## 2013-02-10 NOTE — Interval H&P Note (Signed)
History and Physical Interval Note:  02/10/2013 8:41 AM  [name]  has presented today for surgery, with the diagnosis of acm  The various methods of treatment have been discussed with the patient and family. After consideration of risks, benefits and other options for treatment, the patient has consented to  Procedure(s): ICD GENERATOR CHANGE (N/A) as a surgical intervention .  The patient's history has been reviewed, patient examined, no change in status, stable for surgery.  I have reviewed the patient's chart and labs.  Questions were answered to the patient's satisfaction.   ICD Criteria  Current LVEF (within 6 months):20%  NYHA Functional Classification: Class III  Heart Failure History:  Yes, Duration of heart failure since onset is > 9 months  Non-Ischemic Dilated Cardiomyopathy History:  No.  Atrial Fibrillation/Atrial Flutter:  Yes, A-Fib/A-Flutter type: Permanent (>1 year).  Ventricular Tachycardia History:  Yes, Hemodynamic status unknown. VT Type:  SVT - Monomorphic.  Cardiac Arrest History:  No  History of Syndromes with Risk of Sudden Death:  No.  Previous ICD:  Yes, ICD Type:  Single, Reason for ICD:  Primary prevention.  LVEF is not available  Electrophysiology Study: No.  Anticoagulation Therapy:  Patient is on anticoagulation therapy, anticoagulation was NOT held prior to procedure.   Beta Blocker Therapy:  Yes.   Ace Inhibitor/ARB Therapy:  No, Reason not on Ace Inhibitor/ARB therapy:  allergy  [name]

## 2013-02-10 NOTE — H&P (Signed)
Patient Care Team: Gwen Pounds, MD as PCP - General (Internal Medicine)   HPI  [name] is a 79 y.o. male With ischemic cardiomyopathy and previously implanted ICD   for primary prevention. He has had appropriate therapy for ventricular tachycardia.   He is status post bypass surgery and redo 1979/1991 He alo underwent percutaneous aortic valve replacement. He has done [employer].   He also has a history of atrial fibrillation for which amiodarone was initiated. He was intolerant of this and this has been discontinued. His atrial fibrillation is now permanent. He is on warfarin    Echo 10/2011>> EF 25% with WMA and mod AI--without change     Past Medical History  Diagnosis Date  . Ischemic cardiomyopathy 05/2010    Has EF of 25%  . Diverticulitis     CURRENTLY CONTROLLED WITH NO EVIDENCE OF RECURRENT INFECTION  . AF (atrial fibrillation)     Has not tolerated amiodarone in the past. Amiodarone was stopped in September of 2010 due to side effects  . Chronic anticoagulation     on coumadin  . ICD (implantable cardiac defibrillator) in place   . Prostate cancer   . Osteoarthritis     RIGHT KNEE  . Aortic stenosis, severe     WITH PERCUTANEOUS AORTIC VALVE (TAVI) IN March 2012 at the [facility]  . MI (myocardial infarction) 1974, 89  . Cervical myelopathy   . Pulmonary embolism   . Hyperlipidemia   . GERD (gastroesophageal reflux disease)   . Esophageal stricture     WITH DILATATION  . NSVT (nonsustained ventricular tachycardia)   . Coronary artery disease   . Stroke   . Heart murmur   . Pacemaker     Past Surgical History  Procedure Laterality Date  . Icd  Feb 2003  . Coronary artery bypass graft  1979  . Coronary artery bypass graft  1991    REDO SURGERY  . Cardiac catheterization  2010    SEVERE LV DYSFUNCTION WITH ESTIMATED EJECTION FRACTION OF 25%  . Cholecystectomy    . Aortic valve replacement      Percutaneous AVR in March 2012 at  the [facility]  . Cardioversion  02/16/2011    Procedure: CARDIOVERSION;  Surgeon: Luis Abed, MD;  Location: [facility] OR;  Service: Cardiovascular;  Laterality: N/A;  . Shoulder surgery    . Pacemaker placement      Current Facility-Administered Medications  Medication Dose Route Frequency Provider Last Rate Last Dose  . 0.9 %  sodium chloride infusion   Intravenous Continuous Duke Salvia, MD 50 mL/hr at 02/10/13 [phone]    . chlorhexidine (HIBICLENS) 4 % liquid 4 application  60 mL Topical Once Duke Salvia, MD      . gentamicin (GARAMYCIN) 80 mg in sodium chloride irrigation 0.9 % 500 mL irrigation  80 mg Irrigation On Call Duke Salvia, MD      . mupirocin ointment (BACTROBAN) 2 %   Nasal BID Duke Salvia, MD   1 application at 02/10/13 [phone]  . vancomycin (VANCOCIN) IVPB 1000 mg/200 mL premix  1,000 mg Intravenous On Call Duke Salvia, MD        Allergies  Allergen Reactions  . Penicillins Shortness Of Breath and Rash  . Ace Inhibitors     Has not tolerated in the past due to hyperkalemia  . Antihistamines, Diphenhydramine-Type Other (See Comments)    Inhibits urination  .  Heparin Other (See Comments)    Developed antibodies   . Amiodarone Other (See Comments)    Unknown     Review of Systems negative except from HPI and PMH  Physical Exam BP 148/86  Pulse 72  Temp(Src) 97.6 F (36.4 C) (Oral)  Resp 20  Ht 6\' 3"  (1.905 m)  Wt 191 lb (86.637 kg)  BMI 23.87 kg/m2  SpO2 100% Well developed and well nourished in no acute distress HENT normal E scleral and icterus clear Neck Supple Device pocket well healed; without hematoma or erythema.  There is no tethering JVP flat; carotids brisk and full Clear to ausculation Irregularly irregular rhythm  Soft with active bowel sounds No clubbing cyanosis no  Edema Alert and oriented, grossly normal motor and sensory function Skin Warm and Dry  ECG previously with narrow QRS  Assessment and  Plan  ICD ar ERI   We have reviewed the benefits and risks of generator replacement.  These include but are not limited to lead fracture and infection.  The patient understands, agrees and is willing to proceed.     Will proceed with generator replacement  Rhinorrhea>>Afrin  Ischemic cardiomyopathy  Systolic Heartfailure  euvolemic  A/p TAVR  Atrial Fibrillation--permanent

## 2013-02-10 NOTE — CV Procedure (Signed)
Preoperative diagnosis  ICM primary prevention with subsequent therapy device at [facility]  Postoperative diagnosis same/   Procedure: Generator replacement     Following informed consent the patient was brought to the electrophysiology laboratory in place of the fluoroscopic table in the supine position after routine prep and drape lidocaine was infiltrated in the region of the previous incision and carried down to later the device pocket using sharp dissection and electrocautery. The pocket was opened the device was freed up and was explanted.  Interrogation of the previously implanted ICD ventricular lead [employer] [phone]   demonstrated an R wave of 19  millivolts., and impedance of 688 ohms, and a pacing threshold of 0.7 volts at 0.5 msec. Pacing impedances of HV coils were >600 prox and distal   The leads were inspected. Repair was not needed. The leads were then attached to a [employer] DVBBEVERA pulse generator, serial number [id number].    Through the device the r-wave amplitude  Was  >20 milllivolts and impedance of  532 ohms, and a pacing threshold of 1.0 volts at @ 0.79milliseconds; t   High voltage impedances were 56/43 ohms  The pocket was irrigated with antibiotic containing saline solution hemostasis was assured and the leads and the device were placed in the pocket. The wound was then closed in 3 layers in normal fashion. A dermabond dressing was applied  The patient tolerated the procedure without apparent complication.  DFT testing was not performed  [name]   \

## 2013-02-12 ENCOUNTER — Other Ambulatory Visit: Payer: Self-pay | Admitting: Cardiology

## 2013-02-12 ENCOUNTER — Encounter (HOSPITAL_COMMUNITY): Payer: Self-pay | Admitting: *Deleted

## 2013-02-13 ENCOUNTER — Other Ambulatory Visit: Payer: Self-pay

## 2013-02-19 ENCOUNTER — Telehealth: Payer: Self-pay

## 2013-02-19 ENCOUNTER — Other Ambulatory Visit: Payer: Self-pay

## 2013-02-19 MED ORDER — LEVOTHYROXINE SODIUM 150 MCG PO TABS: 150.0000 ug | ORAL_TABLET | Freq: Every day | ORAL | Status: DC

## 2013-02-19 MED ORDER — ROSUVASTATIN CALCIUM 5 MG PO TABS: ORAL_TABLET | ORAL | Status: DC

## 2013-02-20 ENCOUNTER — Telehealth: Payer: Self-pay

## 2013-02-20 ENCOUNTER — Encounter: Payer: Self-pay | Admitting: Internal Medicine

## 2013-02-20 ENCOUNTER — Ambulatory Visit (INDEPENDENT_AMBULATORY_CARE_PROVIDER_SITE_OTHER): Payer: Medicare Other | Admitting: *Deleted

## 2013-02-20 DIAGNOSIS — I2589 Other forms of chronic ischemic heart disease: Secondary | ICD-10-CM | POA: Diagnosis not present

## 2013-02-20 DIAGNOSIS — I472 Ventricular tachycardia: Secondary | ICD-10-CM | POA: Diagnosis not present

## 2013-02-20 DIAGNOSIS — I255 Ischemic cardiomyopathy: Secondary | ICD-10-CM

## 2013-02-20 LAB — MDC_IDC_ENUM_SESS_TYPE_INCLINIC
Battery Remaining Longevity: 137 mo
Brady Statistic RV Percent Paced: 5.36 %
Date Time Interrogation Session: 20141211174205
HighPow Impedance: 228 Ohm
HighPow Impedance: 39 Ohm
HighPow Impedance: 54 Ohm
Lead Channel Impedance Value: 551 Ohm
Lead Channel Pacing Threshold Amplitude: 0.75 V
Lead Channel Pacing Threshold Pulse Width: 0.4 ms
Lead Channel Setting Pacing Amplitude: 2 V
Lead Channel Setting Pacing Pulse Width: 0.4 ms
Zone Setting Detection Interval: 300 ms
Zone Setting Detection Interval: 400 ms
Zone Setting Detection Interval: 450 ms

## 2013-02-20 NOTE — Progress Notes (Signed)
Wound check appointment. Steri-strips removed. Wound without redness or edema. Incision edges approximated, wound well healed. Normal device function. Threshold, sensing, and impedances consistent with implant/previous measurements. Device programmed at appropriate safety margins. Histogram distribution appropriate for patient and level of activity. 3 ventricular arrhythmias noted---max dur. 14 bts, max V 210bpm---EGMs consistent with NSVT. Patient educated about wound care, arm mobility, lifting restrictions, shock plan. ROV in 3 months with [place], [place].

## 2013-02-20 NOTE — Telephone Encounter (Signed)
He is having a lot of muscle ache/weakness problems. See lab result note from September. He was to stay off of statin drugs with no plan to resume. crestor once/week will not be helpful from a cardiac risk reduction standpoint.

## 2013-02-20 NOTE — Telephone Encounter (Signed)
Called patient to let him know that he is to stay off of crestor because of muscle and leg weakness, I also called the pharmacy to cancel order from yesterday 02/19/2013 Per Dr Excell Seltzer

## 2013-02-24 ENCOUNTER — Encounter: Payer: Self-pay | Admitting: Pharmacist

## 2013-02-24 ENCOUNTER — Ambulatory Visit (INDEPENDENT_AMBULATORY_CARE_PROVIDER_SITE_OTHER): Payer: Medicare Other | Admitting: Cardiovascular Disease

## 2013-02-24 DIAGNOSIS — I482 Chronic atrial fibrillation, unspecified: Secondary | ICD-10-CM

## 2013-02-24 DIAGNOSIS — Z5181 Encounter for therapeutic drug level monitoring: Secondary | ICD-10-CM

## 2013-02-24 DIAGNOSIS — I4891 Unspecified atrial fibrillation: Secondary | ICD-10-CM

## 2013-02-24 DIAGNOSIS — Z7901 Long term (current) use of anticoagulants: Secondary | ICD-10-CM

## 2013-02-24 NOTE — Progress Notes (Signed)
This encounter was created in error - please disregard.

## 2013-02-26 NOTE — Telephone Encounter (Signed)
Will discuss Crestor with pt at 02/27/13 appointment.

## 2013-02-27 ENCOUNTER — Encounter: Payer: Self-pay | Admitting: Cardiovascular Disease

## 2013-02-27 ENCOUNTER — Ambulatory Visit (INDEPENDENT_AMBULATORY_CARE_PROVIDER_SITE_OTHER): Payer: Medicare Other | Admitting: Cardiovascular Disease

## 2013-02-27 VITALS — BP 130/70 | HR 52 | Ht 75.0 in | Wt 202.0 lb

## 2013-02-27 DIAGNOSIS — E785 Hyperlipidemia, unspecified: Secondary | ICD-10-CM | POA: Diagnosis not present

## 2013-02-27 DIAGNOSIS — I359 Nonrheumatic aortic valve disorder, unspecified: Secondary | ICD-10-CM

## 2013-02-27 DIAGNOSIS — I4891 Unspecified atrial fibrillation: Secondary | ICD-10-CM | POA: Diagnosis not present

## 2013-02-27 MED ORDER — EZETIMIBE 10 MG PO TABS: 10.0000 mg | ORAL_TABLET | Freq: Every day | ORAL | Status: DC

## 2013-02-27 NOTE — Patient Instructions (Signed)
Your physician has recommended you make the following change in your medication: START Zetia 10mg  take one by mouth daily  Your physician recommends that you return for a FASTING LIPID and LIVER profile in [facility] 2015--nothing to eat or drink after midnight, lab opens at 7:30 AM  Your physician recommends that you schedule a follow-up appointment in: MARCH 2015 with Dr Excell Seltzer  Your physician has requested that you regularly monitor and record your blood pressure readings at home. Please use the same machine at the same time of day to check your readings and record them to bring to your follow-up visit.  You have been referred to Cardiac Rehabilitation.

## 2013-02-27 NOTE — Progress Notes (Signed)
HPI:  79 year old gentleman presenting for followup evaluation. He has coronary artery disease and initially was treated with CABG in 1979 and then had redo CABG in 1991. He's had a severe ischemic cardiomyopathy with LVEF 25%. He then underwent transcatheter aortic valve replacement at the [facility] clinic in 2012 for treatment of severe symptomatic aortic stenosis. He has had moderate perivalvular AI.The patient has permanent atrial fibrillation and is maintained on long-term anticoagulation.  Overall the patient is doing well. He hurt his back last year and has had a prolonged recovery, but is finally making progress with his activity level. He denies chest pain or palpitations. He does admit to shortness of breath with activity. This primarily occurs when he is bending over to do exercises.  Outpatient Encounter Prescriptions as of 02/27/2013  Medication Sig  . acetaminophen (TYLENOL) 500 MG tablet Take 1,000 mg by mouth every 6 (six) hours as needed.  Marland Kitchen alfuzosin (UROXATRAL) 10 MG 24 hr tablet Take 10 mg by mouth at bedtime.   Marland Kitchen aspirin 81 MG chewable tablet Chew 81 mg by mouth daily.   . carvedilol (COREG) 6.25 MG tablet Take 1 tablet (6.25 mg total) by mouth 2 (two) times daily with a meal.  . clindamycin (CLEOCIN) 150 MG capsule TAKE 4 CAPSULES (600MG ) 60 MINUTES BEFORE DENTAL PROCEDURE.  Marland Kitchen levothyroxine (SYNTHROID, LEVOTHROID) 150 MCG tablet Take 1 tablet (150 mcg total) by mouth daily.  Marland Kitchen NITROSTAT 0.4 MG SL tablet DISSOLVE 1 TABLET UNDER TONGUE AS NEEDED FOR CHEST PAIN,MAY REPEAT IN5 MINUTES FOR 2 DOSES.  Marland Kitchen omeprazole (PRILOSEC) 20 MG capsule Take 20 mg by mouth daily as needed (acid reflux).  . polyethylene glycol (MIRALAX / GLYCOLAX) packet Take 17 g by mouth daily as needed (constipation).  . RESTASIS 0.05 % ophthalmic emulsion Place 1 drop into both eyes every 12 (twelve) hours.   Marland Kitchen warfarin (COUMADIN) 5 MG tablet Take 5 mg by mouth daily.    Allergies  Allergen Reactions  .  Penicillins Shortness Of Breath and Rash  . Ace Inhibitors     Has not tolerated in the past due to hyperkalemia  . Antihistamines, Diphenhydramine-Type Other (See Comments)    Inhibits urination  . Heparin Other (See Comments)    Developed antibodies   . Amiodarone Other (See Comments)    Unknown     Past Medical History  Diagnosis Date  . Ischemic cardiomyopathy 05/2010    Has EF of 25%  . Diverticulitis     CURRENTLY CONTROLLED WITH NO EVIDENCE OF RECURRENT INFECTION  . AF (atrial fibrillation)     Has not tolerated amiodarone in the past. Amiodarone was stopped in September of 2010 due to side effects  . Chronic anticoagulation     on coumadin  . Prostate cancer   . Osteoarthritis     RIGHT KNEE  . Aortic stenosis, severe     WITH PERCUTANEOUS AORTIC VALVE (TAVI) IN March 2012 at the [facility]  . MI (myocardial infarction) 1974, 68  . Cervical myelopathy   . Pulmonary embolism   . Hyperlipidemia   . GERD (gastroesophageal reflux disease)   . Esophageal stricture     WITH DILATATION  . NSVT (nonsustained ventricular tachycardia)   . Coronary artery disease   . Stroke   . Heart murmur     ROS: Negative except as per HPI  BP 130/70  Pulse 52  Ht 6\' 3"  (1.905 m)  Wt 202 lb (91.627 kg)  BMI 25.25 kg/m2  PHYSICAL EXAM: Pt is alert and oriented, pleasant elderly male in NAD HEENT: normal Neck: JVP - normal, carotids 2+= without bruits Lungs: CTA bilaterally CV: RRR with grade 2/6 diastolic decrescendo murmur at the LSB Abd: soft, NT, Positive BS, no hepatomegaly Ext: no C/C/E, distal pulses intact and equal Skin: warm/dry no rash  2 D Echo 11/05/2012: Left ventricle: Well visualized. The cavity size was moderately dilated. There was mild focal basal hypertrophy of the septum. Systolic function was severely reduced. The estimated ejection fraction was in the range of 25% to 30%. Regional wall motion abnormalities: There is akinesis of the  distalinferoseptal myocardium. There is akinesis and scarring of the basalinferior myocardium. There is akinesis of the apical myocardium. There is severe hypokinesis of the inferolateral myocardium. There is akinesis of the anteroseptal myocardium. The study was not technically sufficient to allow evaluation of LV diastolic dysfunction due to atrial fibrillation.  ------------------------------------------------------------ Aortic valve: Moderate peri valvular regurgitation TAVR Well visualized. A bioprosthesis was present. Doppler: There was no stenosis. VTI ratio of LVOT to aortic valve: 0.48. Peak velocity ratio of LVOT to aortic valve: 0.48. Mean gradient: 9mm Hg (S). Peak gradient: 16mm Hg (S).  ------------------------------------------------------------ Aorta: Ascending aorta: The ascending aorta was mildly dilated.  ------------------------------------------------------------ Mitral valve: Structurally normal valve. Leaflet separation was normal. Doppler: Transvalvular velocity was within the normal range. There was no evidence for stenosis. Mild regurgitation.  ------------------------------------------------------------ Left atrium: The atrium was mildly dilated.  ------------------------------------------------------------ Right ventricle: The cavity size was normal. Wall thickness was normal. Systolic function was normal.  ------------------------------------------------------------ Pulmonic valve: Structurally normal valve. Cusp separation was normal. Doppler: Transvalvular velocity was within the normal range. No regurgitation.  ------------------------------------------------------------ Tricuspid valve: Structurally normal valve. Leaflet separation was normal. Doppler: Transvalvular velocity was within the normal range. No regurgitation.  ------------------------------------------------------------ Pulmonary artery: The main pulmonary artery  was normal-sized. Systolic pressure was within the normal range.  ------------------------------------------------------------ Right atrium: The atrium was normal in size.  ------------------------------------------------------------ Pericardium: The pericardium was normal in appearance.  ------------------------------------------------------------ Systemic veins: Inferior vena cava: The vessel was normal in size; the respirophasic diameter changes were in the normal range (= 50%); findings are consistent with normal central venous pressure.  ASSESSMENT AND PLAN: 1. Chronic systolic heart failure. Overall stable, CCS Class 2 - 3 symptoms. Has had symptoms of postural lightheadedness and still has some low BP readings at home. I don't think we have room to titrate meds because of those symptoms.   2. Aortic valve disease with moderate perivalvular AI. Had 2 TAVR valves placed at the time of implantation. Appears stable by exam.  3. Hyperlipidemia. Statin intolerant. Has severe symptoms with low dose crestor. Advised Zetia 10 mg daily with Lipid/LFT's in 3 months.   Follow-up after labs in 3-4 months.  Tonny Bollman 03/01/2013 8:56 PM

## 2013-03-01 ENCOUNTER — Encounter: Payer: Self-pay | Admitting: Cardiovascular Disease

## 2013-03-10 DIAGNOSIS — C61 Malignant neoplasm of prostate: Secondary | ICD-10-CM | POA: Diagnosis not present

## 2013-03-11 ENCOUNTER — Ambulatory Visit (INDEPENDENT_AMBULATORY_CARE_PROVIDER_SITE_OTHER): Payer: Medicare Other | Admitting: Pharmacist

## 2013-03-11 DIAGNOSIS — I482 Chronic atrial fibrillation, unspecified: Secondary | ICD-10-CM

## 2013-03-11 DIAGNOSIS — Z5181 Encounter for therapeutic drug level monitoring: Secondary | ICD-10-CM

## 2013-03-11 DIAGNOSIS — I4891 Unspecified atrial fibrillation: Secondary | ICD-10-CM

## 2013-03-11 DIAGNOSIS — Z7901 Long term (current) use of anticoagulants: Secondary | ICD-10-CM | POA: Diagnosis not present

## 2013-03-11 LAB — POCT INR: INR: 2.8

## 2013-03-17 DIAGNOSIS — N320 Bladder-neck obstruction: Secondary | ICD-10-CM | POA: Diagnosis not present

## 2013-03-17 DIAGNOSIS — C61 Malignant neoplasm of prostate: Secondary | ICD-10-CM | POA: Diagnosis not present

## 2013-03-25 ENCOUNTER — Encounter (HOSPITAL_COMMUNITY)
Admission: RE | Admit: 2013-03-25 | Discharge: 2013-03-25 | Disposition: A | Discharge status: Home / Self Care | Payer: Self-pay | Source: Ambulatory Visit | Attending: Cardiovascular Disease | Admitting: Cardiovascular Disease

## 2013-03-25 DIAGNOSIS — Z951 Presence of aortocoronary bypass graft: Secondary | ICD-10-CM | POA: Insufficient documentation

## 2013-03-25 DIAGNOSIS — Z954 Presence of other heart-valve replacement: Secondary | ICD-10-CM | POA: Insufficient documentation

## 2013-03-25 DIAGNOSIS — Z7901 Long term (current) use of anticoagulants: Secondary | ICD-10-CM | POA: Insufficient documentation

## 2013-03-25 DIAGNOSIS — I2589 Other forms of chronic ischemic heart disease: Secondary | ICD-10-CM | POA: Insufficient documentation

## 2013-03-25 DIAGNOSIS — I4891 Unspecified atrial fibrillation: Secondary | ICD-10-CM | POA: Insufficient documentation

## 2013-03-25 DIAGNOSIS — I2510 Atherosclerotic heart disease of native coronary artery without angina pectoris: Secondary | ICD-10-CM | POA: Insufficient documentation

## 2013-03-25 DIAGNOSIS — I252 Old myocardial infarction: Secondary | ICD-10-CM | POA: Insufficient documentation

## 2013-03-25 DIAGNOSIS — Z950 Presence of cardiac pacemaker: Secondary | ICD-10-CM | POA: Insufficient documentation

## 2013-03-25 DIAGNOSIS — Z5189 Encounter for other specified aftercare: Secondary | ICD-10-CM | POA: Insufficient documentation

## 2013-03-26 ENCOUNTER — Ambulatory Visit (INDEPENDENT_AMBULATORY_CARE_PROVIDER_SITE_OTHER): Payer: Medicare Other | Admitting: Pharmacist

## 2013-03-26 ENCOUNTER — Encounter (HOSPITAL_COMMUNITY): Payer: Self-pay

## 2013-03-26 DIAGNOSIS — Z7901 Long term (current) use of anticoagulants: Secondary | ICD-10-CM

## 2013-03-26 DIAGNOSIS — I4891 Unspecified atrial fibrillation: Secondary | ICD-10-CM

## 2013-03-26 DIAGNOSIS — Z5181 Encounter for therapeutic drug level monitoring: Secondary | ICD-10-CM

## 2013-03-26 DIAGNOSIS — I482 Chronic atrial fibrillation, unspecified: Secondary | ICD-10-CM

## 2013-03-26 LAB — POCT INR: INR: 2.6

## 2013-03-27 ENCOUNTER — Encounter (HOSPITAL_COMMUNITY)
Admission: RE | Admit: 2013-03-27 | Discharge: 2013-03-27 | Disposition: A | Discharge status: Home / Self Care | Payer: Self-pay | Source: Ambulatory Visit | Attending: Cardiovascular Disease | Admitting: Cardiovascular Disease

## 2013-04-01 ENCOUNTER — Encounter (HOSPITAL_COMMUNITY)
Admission: RE | Admit: 2013-04-01 | Discharge: 2013-04-01 | Disposition: A | Discharge status: Home / Self Care | Payer: Self-pay | Source: Ambulatory Visit | Attending: Cardiovascular Disease | Admitting: Cardiovascular Disease

## 2013-04-02 ENCOUNTER — Encounter (HOSPITAL_COMMUNITY): Payer: Self-pay

## 2013-04-03 ENCOUNTER — Encounter (HOSPITAL_COMMUNITY): Payer: Self-pay

## 2013-04-03 DIAGNOSIS — M899 Disorder of bone, unspecified: Secondary | ICD-10-CM | POA: Diagnosis not present

## 2013-04-03 DIAGNOSIS — E039 Hypothyroidism, unspecified: Secondary | ICD-10-CM | POA: Diagnosis not present

## 2013-04-03 DIAGNOSIS — E785 Hyperlipidemia, unspecified: Secondary | ICD-10-CM | POA: Diagnosis not present

## 2013-04-03 DIAGNOSIS — Z125 Encounter for screening for malignant neoplasm of prostate: Secondary | ICD-10-CM | POA: Diagnosis not present

## 2013-04-03 DIAGNOSIS — M949 Disorder of cartilage, unspecified: Secondary | ICD-10-CM | POA: Diagnosis not present

## 2013-04-03 DIAGNOSIS — R7309 Other abnormal glucose: Secondary | ICD-10-CM | POA: Diagnosis not present

## 2013-04-03 DIAGNOSIS — I2510 Atherosclerotic heart disease of native coronary artery without angina pectoris: Secondary | ICD-10-CM | POA: Diagnosis not present

## 2013-04-08 ENCOUNTER — Encounter (HOSPITAL_COMMUNITY): Payer: Self-pay

## 2013-04-08 DIAGNOSIS — Z1212 Encounter for screening for malignant neoplasm of rectum: Secondary | ICD-10-CM | POA: Diagnosis not present

## 2013-04-09 ENCOUNTER — Ambulatory Visit (INDEPENDENT_AMBULATORY_CARE_PROVIDER_SITE_OTHER): Payer: Medicare Other | Admitting: Pharmacist

## 2013-04-09 ENCOUNTER — Encounter (HOSPITAL_COMMUNITY): Payer: Self-pay

## 2013-04-09 DIAGNOSIS — Z7901 Long term (current) use of anticoagulants: Secondary | ICD-10-CM

## 2013-04-09 DIAGNOSIS — Z5181 Encounter for therapeutic drug level monitoring: Secondary | ICD-10-CM

## 2013-04-09 DIAGNOSIS — I482 Chronic atrial fibrillation, unspecified: Secondary | ICD-10-CM

## 2013-04-09 DIAGNOSIS — I4891 Unspecified atrial fibrillation: Secondary | ICD-10-CM

## 2013-04-09 LAB — POCT INR: INR: 2.7

## 2013-04-10 ENCOUNTER — Encounter (HOSPITAL_COMMUNITY): Payer: Self-pay

## 2013-04-10 DIAGNOSIS — R7309 Other abnormal glucose: Secondary | ICD-10-CM | POA: Diagnosis not present

## 2013-04-10 DIAGNOSIS — Z0000 Encounter for general adult medical examination without abnormal findings: Secondary | ICD-10-CM | POA: Diagnosis not present

## 2013-04-10 DIAGNOSIS — J069 Acute upper respiratory infection, unspecified: Secondary | ICD-10-CM | POA: Diagnosis not present

## 2013-04-10 DIAGNOSIS — R627 Adult failure to thrive: Secondary | ICD-10-CM | POA: Diagnosis not present

## 2013-04-10 DIAGNOSIS — Z23 Encounter for immunization: Secondary | ICD-10-CM | POA: Diagnosis not present

## 2013-04-10 DIAGNOSIS — I4891 Unspecified atrial fibrillation: Secondary | ICD-10-CM | POA: Diagnosis not present

## 2013-04-10 DIAGNOSIS — E559 Vitamin D deficiency, unspecified: Secondary | ICD-10-CM | POA: Diagnosis not present

## 2013-04-10 DIAGNOSIS — Z7901 Long term (current) use of anticoagulants: Secondary | ICD-10-CM | POA: Diagnosis not present

## 2013-04-10 DIAGNOSIS — J029 Acute pharyngitis, unspecified: Secondary | ICD-10-CM | POA: Diagnosis not present

## 2013-04-14 ENCOUNTER — Ambulatory Visit (INDEPENDENT_AMBULATORY_CARE_PROVIDER_SITE_OTHER): Payer: Medicare Other | Admitting: Interventional Cardiology

## 2013-04-14 DIAGNOSIS — I4891 Unspecified atrial fibrillation: Secondary | ICD-10-CM

## 2013-04-14 DIAGNOSIS — I482 Chronic atrial fibrillation, unspecified: Secondary | ICD-10-CM

## 2013-04-14 DIAGNOSIS — Z7901 Long term (current) use of anticoagulants: Secondary | ICD-10-CM | POA: Diagnosis not present

## 2013-04-14 DIAGNOSIS — Z5181 Encounter for therapeutic drug level monitoring: Secondary | ICD-10-CM

## 2013-04-14 LAB — POCT INR: INR: 2.9

## 2013-04-15 ENCOUNTER — Encounter (HOSPITAL_COMMUNITY): Payer: Medicare Other | Attending: Cardiovascular Disease

## 2013-04-15 DIAGNOSIS — I472 Ventricular tachycardia, unspecified: Secondary | ICD-10-CM | POA: Insufficient documentation

## 2013-04-15 DIAGNOSIS — Z5189 Encounter for other specified aftercare: Secondary | ICD-10-CM | POA: Insufficient documentation

## 2013-04-15 DIAGNOSIS — Z95810 Presence of automatic (implantable) cardiac defibrillator: Secondary | ICD-10-CM | POA: Insufficient documentation

## 2013-04-15 DIAGNOSIS — I359 Nonrheumatic aortic valve disorder, unspecified: Secondary | ICD-10-CM | POA: Insufficient documentation

## 2013-04-15 DIAGNOSIS — N189 Chronic kidney disease, unspecified: Secondary | ICD-10-CM | POA: Insufficient documentation

## 2013-04-15 DIAGNOSIS — I4891 Unspecified atrial fibrillation: Secondary | ICD-10-CM | POA: Insufficient documentation

## 2013-04-15 DIAGNOSIS — I4729 Other ventricular tachycardia: Secondary | ICD-10-CM | POA: Insufficient documentation

## 2013-04-15 DIAGNOSIS — Z951 Presence of aortocoronary bypass graft: Secondary | ICD-10-CM | POA: Insufficient documentation

## 2013-04-16 ENCOUNTER — Encounter (HOSPITAL_COMMUNITY): Payer: Self-pay

## 2013-04-17 ENCOUNTER — Encounter (HOSPITAL_COMMUNITY): Payer: Self-pay

## 2013-04-18 ENCOUNTER — Ambulatory Visit (INDEPENDENT_AMBULATORY_CARE_PROVIDER_SITE_OTHER): Payer: Medicare Other | Admitting: Internal Medicine

## 2013-04-18 DIAGNOSIS — Z5181 Encounter for therapeutic drug level monitoring: Secondary | ICD-10-CM

## 2013-04-18 DIAGNOSIS — I482 Chronic atrial fibrillation, unspecified: Secondary | ICD-10-CM

## 2013-04-18 DIAGNOSIS — I4891 Unspecified atrial fibrillation: Secondary | ICD-10-CM | POA: Diagnosis not present

## 2013-04-18 DIAGNOSIS — Z7901 Long term (current) use of anticoagulants: Secondary | ICD-10-CM | POA: Diagnosis not present

## 2013-04-18 LAB — POCT INR: INR: 4.1

## 2013-04-22 ENCOUNTER — Encounter (HOSPITAL_COMMUNITY): Payer: Self-pay

## 2013-04-23 ENCOUNTER — Encounter (HOSPITAL_COMMUNITY): Payer: Self-pay

## 2013-04-23 ENCOUNTER — Ambulatory Visit (INDEPENDENT_AMBULATORY_CARE_PROVIDER_SITE_OTHER): Payer: Medicare Other | Admitting: Pharmacist

## 2013-04-23 DIAGNOSIS — I4891 Unspecified atrial fibrillation: Secondary | ICD-10-CM

## 2013-04-23 DIAGNOSIS — Z5181 Encounter for therapeutic drug level monitoring: Secondary | ICD-10-CM

## 2013-04-23 DIAGNOSIS — Z7901 Long term (current) use of anticoagulants: Secondary | ICD-10-CM

## 2013-04-23 DIAGNOSIS — I482 Chronic atrial fibrillation, unspecified: Secondary | ICD-10-CM

## 2013-04-23 LAB — POCT INR: INR: 1.7

## 2013-04-24 ENCOUNTER — Encounter (HOSPITAL_COMMUNITY): Payer: Self-pay

## 2013-04-29 ENCOUNTER — Encounter (HOSPITAL_COMMUNITY): Payer: Self-pay

## 2013-04-30 ENCOUNTER — Encounter (HOSPITAL_COMMUNITY): Payer: Self-pay

## 2013-04-30 LAB — POCT INR: INR: 2.7

## 2013-05-01 ENCOUNTER — Encounter (HOSPITAL_COMMUNITY): Payer: Self-pay

## 2013-05-01 ENCOUNTER — Ambulatory Visit (INDEPENDENT_AMBULATORY_CARE_PROVIDER_SITE_OTHER): Payer: Medicare Other | Admitting: Pharmacist

## 2013-05-01 DIAGNOSIS — Z7901 Long term (current) use of anticoagulants: Secondary | ICD-10-CM

## 2013-05-01 DIAGNOSIS — Z5181 Encounter for therapeutic drug level monitoring: Secondary | ICD-10-CM

## 2013-05-01 DIAGNOSIS — I482 Chronic atrial fibrillation, unspecified: Secondary | ICD-10-CM

## 2013-05-01 DIAGNOSIS — I4891 Unspecified atrial fibrillation: Secondary | ICD-10-CM

## 2013-05-05 DIAGNOSIS — I4891 Unspecified atrial fibrillation: Secondary | ICD-10-CM | POA: Diagnosis not present

## 2013-05-05 DIAGNOSIS — R05 Cough: Secondary | ICD-10-CM | POA: Diagnosis not present

## 2013-05-05 DIAGNOSIS — Z7901 Long term (current) use of anticoagulants: Secondary | ICD-10-CM | POA: Diagnosis not present

## 2013-05-05 DIAGNOSIS — J0100 Acute maxillary sinusitis, unspecified: Secondary | ICD-10-CM | POA: Diagnosis not present

## 2013-05-05 DIAGNOSIS — IMO0002 Reserved for concepts with insufficient information to code with codable children: Secondary | ICD-10-CM | POA: Diagnosis not present

## 2013-05-05 DIAGNOSIS — J069 Acute upper respiratory infection, unspecified: Secondary | ICD-10-CM | POA: Diagnosis not present

## 2013-05-05 DIAGNOSIS — R059 Cough, unspecified: Secondary | ICD-10-CM | POA: Diagnosis not present

## 2013-05-06 ENCOUNTER — Encounter (HOSPITAL_COMMUNITY): Payer: Self-pay

## 2013-05-07 ENCOUNTER — Encounter (HOSPITAL_COMMUNITY): Payer: Self-pay

## 2013-05-08 ENCOUNTER — Encounter (HOSPITAL_COMMUNITY): Payer: Self-pay

## 2013-05-08 DIAGNOSIS — I4891 Unspecified atrial fibrillation: Secondary | ICD-10-CM | POA: Diagnosis not present

## 2013-05-08 DIAGNOSIS — Z7901 Long term (current) use of anticoagulants: Secondary | ICD-10-CM | POA: Diagnosis not present

## 2013-05-08 LAB — POCT INR: INR: 1.8

## 2013-05-09 ENCOUNTER — Ambulatory Visit (INDEPENDENT_AMBULATORY_CARE_PROVIDER_SITE_OTHER): Payer: Medicare Other | Admitting: Cardiovascular Disease

## 2013-05-09 DIAGNOSIS — Z5181 Encounter for therapeutic drug level monitoring: Secondary | ICD-10-CM

## 2013-05-09 DIAGNOSIS — I482 Chronic atrial fibrillation, unspecified: Secondary | ICD-10-CM

## 2013-05-09 DIAGNOSIS — I4891 Unspecified atrial fibrillation: Secondary | ICD-10-CM

## 2013-05-09 DIAGNOSIS — Z7189 Other specified counseling: Secondary | ICD-10-CM | POA: Insufficient documentation

## 2013-05-09 DIAGNOSIS — Z7901 Long term (current) use of anticoagulants: Secondary | ICD-10-CM

## 2013-05-12 DIAGNOSIS — R059 Cough, unspecified: Secondary | ICD-10-CM | POA: Diagnosis not present

## 2013-05-12 DIAGNOSIS — J069 Acute upper respiratory infection, unspecified: Secondary | ICD-10-CM | POA: Diagnosis not present

## 2013-05-12 DIAGNOSIS — I4891 Unspecified atrial fibrillation: Secondary | ICD-10-CM | POA: Diagnosis not present

## 2013-05-12 DIAGNOSIS — R05 Cough: Secondary | ICD-10-CM | POA: Diagnosis not present

## 2013-05-12 DIAGNOSIS — I2510 Atherosclerotic heart disease of native coronary artery without angina pectoris: Secondary | ICD-10-CM | POA: Diagnosis not present

## 2013-05-12 DIAGNOSIS — Z7901 Long term (current) use of anticoagulants: Secondary | ICD-10-CM | POA: Diagnosis not present

## 2013-05-12 DIAGNOSIS — J0100 Acute maxillary sinusitis, unspecified: Secondary | ICD-10-CM | POA: Diagnosis not present

## 2013-05-12 DIAGNOSIS — J309 Allergic rhinitis, unspecified: Secondary | ICD-10-CM | POA: Diagnosis not present

## 2013-05-12 DIAGNOSIS — IMO0002 Reserved for concepts with insufficient information to code with codable children: Secondary | ICD-10-CM | POA: Diagnosis not present

## 2013-05-13 ENCOUNTER — Encounter (HOSPITAL_COMMUNITY): Payer: Medicare Other | Attending: Cardiovascular Disease

## 2013-05-13 DIAGNOSIS — I359 Nonrheumatic aortic valve disorder, unspecified: Secondary | ICD-10-CM | POA: Insufficient documentation

## 2013-05-13 DIAGNOSIS — I4729 Other ventricular tachycardia: Secondary | ICD-10-CM | POA: Insufficient documentation

## 2013-05-13 DIAGNOSIS — Z5189 Encounter for other specified aftercare: Secondary | ICD-10-CM | POA: Insufficient documentation

## 2013-05-13 DIAGNOSIS — I472 Ventricular tachycardia, unspecified: Secondary | ICD-10-CM | POA: Insufficient documentation

## 2013-05-13 DIAGNOSIS — N189 Chronic kidney disease, unspecified: Secondary | ICD-10-CM | POA: Insufficient documentation

## 2013-05-13 DIAGNOSIS — I4891 Unspecified atrial fibrillation: Secondary | ICD-10-CM | POA: Insufficient documentation

## 2013-05-13 DIAGNOSIS — Z951 Presence of aortocoronary bypass graft: Secondary | ICD-10-CM | POA: Insufficient documentation

## 2013-05-13 DIAGNOSIS — Z95810 Presence of automatic (implantable) cardiac defibrillator: Secondary | ICD-10-CM | POA: Insufficient documentation

## 2013-05-14 ENCOUNTER — Encounter (HOSPITAL_COMMUNITY): Payer: Self-pay

## 2013-05-14 ENCOUNTER — Ambulatory Visit (INDEPENDENT_AMBULATORY_CARE_PROVIDER_SITE_OTHER): Payer: Medicare Other | Admitting: Cardiovascular Disease

## 2013-05-14 DIAGNOSIS — I482 Chronic atrial fibrillation, unspecified: Secondary | ICD-10-CM

## 2013-05-14 DIAGNOSIS — I4891 Unspecified atrial fibrillation: Secondary | ICD-10-CM

## 2013-05-14 DIAGNOSIS — Z5181 Encounter for therapeutic drug level monitoring: Secondary | ICD-10-CM

## 2013-05-14 LAB — POCT INR: INR: 2.3

## 2013-05-15 ENCOUNTER — Encounter (HOSPITAL_COMMUNITY): Payer: Self-pay

## 2013-05-19 ENCOUNTER — Ambulatory Visit (INDEPENDENT_AMBULATORY_CARE_PROVIDER_SITE_OTHER): Payer: Medicare Other | Admitting: Interventional Cardiology

## 2013-05-19 DIAGNOSIS — I482 Chronic atrial fibrillation, unspecified: Secondary | ICD-10-CM

## 2013-05-19 DIAGNOSIS — Z5181 Encounter for therapeutic drug level monitoring: Secondary | ICD-10-CM

## 2013-05-19 DIAGNOSIS — I4891 Unspecified atrial fibrillation: Secondary | ICD-10-CM

## 2013-05-19 LAB — POCT INR: INR: 2.7

## 2013-05-20 ENCOUNTER — Encounter: Payer: Medicare Other | Admitting: Cardiology

## 2013-05-20 ENCOUNTER — Encounter (HOSPITAL_COMMUNITY): Payer: Self-pay

## 2013-05-21 ENCOUNTER — Encounter: Payer: Medicare Other | Admitting: Cardiology

## 2013-05-21 ENCOUNTER — Encounter (HOSPITAL_COMMUNITY): Payer: Self-pay

## 2013-05-21 ENCOUNTER — Ambulatory Visit (INDEPENDENT_AMBULATORY_CARE_PROVIDER_SITE_OTHER): Payer: Medicare Other | Admitting: *Deleted

## 2013-05-21 ENCOUNTER — Encounter: Payer: Self-pay | Admitting: Internal Medicine

## 2013-05-21 DIAGNOSIS — I4891 Unspecified atrial fibrillation: Secondary | ICD-10-CM | POA: Diagnosis not present

## 2013-05-21 DIAGNOSIS — I472 Ventricular tachycardia: Secondary | ICD-10-CM

## 2013-05-21 DIAGNOSIS — Z95810 Presence of automatic (implantable) cardiac defibrillator: Secondary | ICD-10-CM | POA: Diagnosis not present

## 2013-05-21 DIAGNOSIS — I255 Ischemic cardiomyopathy: Secondary | ICD-10-CM

## 2013-05-21 DIAGNOSIS — I4729 Other ventricular tachycardia: Secondary | ICD-10-CM | POA: Diagnosis not present

## 2013-05-21 DIAGNOSIS — I482 Chronic atrial fibrillation, unspecified: Secondary | ICD-10-CM

## 2013-05-21 DIAGNOSIS — I2589 Other forms of chronic ischemic heart disease: Secondary | ICD-10-CM | POA: Diagnosis not present

## 2013-05-21 LAB — MDC_IDC_ENUM_SESS_TYPE_INCLINIC
Battery Remaining Longevity: 135 mo
Battery Voltage: 3.11 V
Brady Statistic RV Percent Paced: 4.89 %
Date Time Interrogation Session: 20150311130418
HighPow Impedance: 247 Ohm
HighPow Impedance: 42 Ohm
HighPow Impedance: 56 Ohm
Lead Channel Sensing Intrinsic Amplitude: 16.75 mV
Lead Channel Setting Pacing Pulse Width: 0.4 ms
Lead Channel Setting Sensing Sensitivity: 0.3 mV
MDC IDC MSMT LEADCHNL RV IMPEDANCE VALUE: 589 Ohm
MDC IDC MSMT LEADCHNL RV PACING THRESHOLD AMPLITUDE: 1 V
MDC IDC MSMT LEADCHNL RV PACING THRESHOLD PULSEWIDTH: 0.4 ms
MDC IDC MSMT LEADCHNL RV SENSING INTR AMPL: 18.5 mV
MDC IDC SET LEADCHNL RV PACING AMPLITUDE: 2 V
MDC IDC SET ZONE DETECTION INTERVAL: 450 ms
Zone Setting Detection Interval: 300 ms
Zone Setting Detection Interval: 400 ms

## 2013-05-21 NOTE — Progress Notes (Signed)
ICD check in clinic. Normal device function. Thresholds and sensing consistent with previous device measurements. Impedance trends stable over time. 10 NSVT---longest w/ EGM 25 beats. Histogram distribution appropriate for patient and level of activity. No changes made this session. Device programmed at appropriate safety margins. Device programmed to optimize intrinsic conduction. Estimated longevity 11.71yrs. Alert tones demonstrated for patient, pt knows to call clinic if heard.  [facility] 08/20/13 & ROV w/ SK 12/15.

## 2013-05-22 ENCOUNTER — Encounter (HOSPITAL_COMMUNITY): Payer: Self-pay

## 2013-05-26 ENCOUNTER — Other Ambulatory Visit: Payer: Self-pay | Admitting: Cardiovascular Disease

## 2013-05-27 ENCOUNTER — Encounter (HOSPITAL_COMMUNITY): Payer: Self-pay

## 2013-05-27 ENCOUNTER — Other Ambulatory Visit (INDEPENDENT_AMBULATORY_CARE_PROVIDER_SITE_OTHER): Payer: Medicare Other

## 2013-05-27 DIAGNOSIS — I4891 Unspecified atrial fibrillation: Secondary | ICD-10-CM | POA: Diagnosis not present

## 2013-05-27 DIAGNOSIS — E785 Hyperlipidemia, unspecified: Secondary | ICD-10-CM | POA: Diagnosis not present

## 2013-05-27 DIAGNOSIS — I359 Nonrheumatic aortic valve disorder, unspecified: Secondary | ICD-10-CM

## 2013-05-27 LAB — LIPID PANEL
Cholesterol: 208 mg/dL — ABNORMAL HIGH (ref 0–200)
HDL: 44.3 mg/dL (ref >39.00)
LDL Cholesterol: 147 mg/dL — ABNORMAL HIGH (ref 0–99)
Total CHOL/HDL Ratio: 5
Triglycerides: 85 mg/dL (ref 0.0–149.0)
VLDL: 17 mg/dL (ref 0.0–40.0)

## 2013-05-27 LAB — HEPATIC FUNCTION PANEL
ALK PHOS: 64 U/L (ref 39–117)
ALT: 15 U/L (ref 0–53)
AST: 19 U/L (ref 0–37)
Albumin: 3.9 g/dL (ref 3.5–5.2)
BILIRUBIN TOTAL: 1 mg/dL (ref 0.3–1.2)
Bilirubin, Direct: 0.1 mg/dL (ref 0.0–0.3)
Total Protein: 6.4 g/dL (ref 6.0–8.3)

## 2013-05-28 ENCOUNTER — Ambulatory Visit (INDEPENDENT_AMBULATORY_CARE_PROVIDER_SITE_OTHER): Payer: Medicare Other | Admitting: *Deleted

## 2013-05-28 ENCOUNTER — Encounter (HOSPITAL_COMMUNITY): Payer: Self-pay

## 2013-05-28 DIAGNOSIS — I482 Chronic atrial fibrillation, unspecified: Secondary | ICD-10-CM

## 2013-05-28 DIAGNOSIS — Z5181 Encounter for therapeutic drug level monitoring: Secondary | ICD-10-CM

## 2013-05-28 DIAGNOSIS — Z7901 Long term (current) use of anticoagulants: Secondary | ICD-10-CM

## 2013-05-28 DIAGNOSIS — I4891 Unspecified atrial fibrillation: Secondary | ICD-10-CM | POA: Diagnosis not present

## 2013-05-28 LAB — POCT INR: INR: 2.5

## 2013-05-29 ENCOUNTER — Encounter (HOSPITAL_COMMUNITY): Payer: Self-pay

## 2013-06-03 ENCOUNTER — Encounter: Payer: Self-pay | Admitting: Cardiovascular Disease

## 2013-06-03 ENCOUNTER — Ambulatory Visit (INDEPENDENT_AMBULATORY_CARE_PROVIDER_SITE_OTHER): Payer: Medicare Other | Admitting: Cardiovascular Disease

## 2013-06-03 ENCOUNTER — Encounter (HOSPITAL_COMMUNITY): Payer: Self-pay

## 2013-06-03 VITALS — BP 122/70 | HR 80 | Ht 75.0 in | Wt 197.0 lb

## 2013-06-03 DIAGNOSIS — I2589 Other forms of chronic ischemic heart disease: Secondary | ICD-10-CM

## 2013-06-03 DIAGNOSIS — E785 Hyperlipidemia, unspecified: Secondary | ICD-10-CM

## 2013-06-03 DIAGNOSIS — I4891 Unspecified atrial fibrillation: Secondary | ICD-10-CM | POA: Diagnosis not present

## 2013-06-03 DIAGNOSIS — I359 Nonrheumatic aortic valve disorder, unspecified: Secondary | ICD-10-CM

## 2013-06-03 MED ORDER — ATORVASTATIN CALCIUM 10 MG PO TABS: ORAL_TABLET | ORAL | Status: DC

## 2013-06-03 NOTE — Patient Instructions (Signed)
Your physician has recommended you make the following change in your medication:  Take Atorvastatin 10 mg every other day  Your physician recommends that you return for lab work in: 3 months for fasting cholesterol and liver screening (nothing to eat or drink except water after midnight the night before your appointment)  Your physician has requested that you have an echocardiogram in 6 months a week or 2 before you see Dr. Burt Knack. Echocardiography is a painless test that uses sound waves to create images of your heart. It provides your doctor with information about the size and shape of your heart and how well your heart's chambers and valves are working. This procedure takes approximately one hour. There are no restrictions for this procedure.  Your physician wants you to follow-up in: 6 months with Dr. Burt Knack after your echocardiogram.  You will receive a reminder letter in the mail two months in advance. If you don't receive a letter, please call our office to schedule the follow-up appointment.

## 2013-06-04 ENCOUNTER — Encounter (HOSPITAL_COMMUNITY): Payer: Self-pay

## 2013-06-05 ENCOUNTER — Encounter (HOSPITAL_COMMUNITY): Payer: Self-pay

## 2013-06-07 ENCOUNTER — Encounter: Payer: Self-pay | Admitting: Cardiovascular Disease

## 2013-06-07 NOTE — Progress Notes (Signed)
HPI:  80 year-old male presenting for follow-up evaluation. He has coronary artery disease and initially was treated with CABG in 1979 and then had redo CABG in 1991. He's had a severe ischemic cardiomyopathy with LVEF 25%. He then underwent transcatheter aortic valve replacement at the [facility] clinic in 2012 for treatment of severe symptomatic aortic stenosis. He has had moderate perivalvular AI.The patient has permanent atrial fibrillation and is maintained on long-term anticoagulation.   The patient is doing fairly well. He had a serious back injury last year with prolonged recovery but he's now regained good functional capacity. He has mild DOE, no chest pain, no edema. Denies lightheadedness or syncope. He is concerned about his cholesterol numbers and eager to get back on medication. He has had myalgias related to statin drugs.    Outpatient Encounter Prescriptions as of 06/03/2013  Medication Sig  . acetaminophen (TYLENOL) 500 MG tablet Take 1,000 mg by mouth every 6 (six) hours as needed.  Marland Kitchen alfuzosin (UROXATRAL) 10 MG 24 hr tablet Take 10 mg by mouth at bedtime.   Marland Kitchen aspirin 81 MG chewable tablet Chew 81 mg by mouth daily.   . carvedilol (COREG) 6.25 MG tablet Take 1 tablet (6.25 mg total) by mouth 2 (two) times daily with a meal.  . clindamycin (CLEOCIN) 150 MG capsule TAKE 4 CAPSULES (600MG ) 60 MINUTES BEFORE DENTAL PROCEDURE.  Marland Kitchen levothyroxine (SYNTHROID, LEVOTHROID) 150 MCG tablet Take 1 tablet (150 mcg total) by mouth daily.  Marland Kitchen NITROSTAT 0.4 MG SL tablet DISSOLVE 1 TABLET UNDER TONGUE AS NEEDED FOR CHEST PAIN,MAY REPEAT IN5 MINUTES FOR 2 DOSES.  Marland Kitchen omeprazole (PRILOSEC) 20 MG capsule Take 20 mg by mouth daily as needed (acid reflux).  . polyethylene glycol (MIRALAX / GLYCOLAX) packet Take 17 g by mouth daily as needed (constipation).  . RESTASIS 0.05 % ophthalmic emulsion Place 1 drop into both eyes every 12 (twelve) hours.   Marland Kitchen warfarin (COUMADIN) 5 MG tablet Take as directed by  coumadin clinic  . atorvastatin (LIPITOR) 10 MG tablet Take 1 tablet every other day  . [DISCONTINUED] ezetimibe (ZETIA) 10 MG tablet Take 1 tablet (10 mg total) by mouth daily.    Allergies  Allergen Reactions  . Penicillins Shortness Of Breath and Rash  . Ace Inhibitors     Has not tolerated in the past due to hyperkalemia  . Antihistamines, Diphenhydramine-Type Other (See Comments)    Inhibits urination  . Heparin Other (See Comments)    Developed antibodies   . Amiodarone Other (See Comments)    Unknown     Past Medical History  Diagnosis Date  . Ischemic cardiomyopathy 05/2010    Has EF of 25%  . Diverticulitis     CURRENTLY CONTROLLED WITH NO EVIDENCE OF RECURRENT INFECTION  . AF (atrial fibrillation)     Has not tolerated amiodarone in the past. Amiodarone was stopped in September of 2010 due to side effects  . Chronic anticoagulation     on coumadin  . Prostate cancer   . Osteoarthritis     RIGHT KNEE  . Aortic stenosis, severe     WITH PERCUTANEOUS AORTIC VALVE (TAVI) IN March 2012 at the [facility]  . MI (myocardial infarction) 1974, 1978  . Cervical myelopathy   . Pulmonary embolism   . Hyperlipidemia   . GERD (gastroesophageal reflux disease)   . Esophageal stricture     WITH DILATATION  . NSVT (nonsustained ventricular tachycardia)   . Coronary artery disease   .  Stroke   . Heart murmur     ROS: Negative except as per HPI  BP 122/70  Pulse 80  Ht 6\' 3"  (1.905 m)  Wt 197 lb (89.359 kg)  BMI 24.62 kg/m2  PHYSICAL EXAM: Pt is alert and oriented, elderly male in NAD HEENT: normal Neck: JVP - normal, carotids 2+= without bruits Lungs: CTA bilaterally CV: RRR with soft systolic murmur and 2/6 diastolic decrescendo murmur Abd: soft, NT, Positive BS, no hepatomegaly Ext: no C/C/E, distal pulses intact and equal Skin: warm/dry no rash  ASSESSMENT AND PLAN: 1. Aortic valve disease s/p TAVR with moderate perivalvular AI. Will repeat 2D echo in 6  months prior to his rtn visit. Overall stable with NYHA Class 2 CHF.   2. CAD, s/p CABG x 2. Stable without anginal sx's.   3. Hyperlipidemia. Lipids as below: Lipid Panel     Component Value Date/Time   CHOL 208* 05/27/2013 0808   TRIG 85.0 05/27/2013 0808   HDL 44.30 05/27/2013 0808   CHOLHDL 5 05/27/2013 0808   VLDL 17.0 05/27/2013 0808   LDLCALC 147* 05/27/2013 0808   LDL is much higher than in past. He has previously tolerated lipitor for many years but has developed myalgias over recent years. Will try atorvastatin 10 mg QOD. Repeats lipids and LFT's in 3 months.  4. Chronic systolic HF, NYHA Class 2. Intolerant to ACE/ARB secondary to hyperkalemia/hypotension. Continue carvedilol.   5. PAF - on warfarin.  Sherren Mocha 06/07/2013 9:48 PM

## 2013-06-10 ENCOUNTER — Encounter (HOSPITAL_COMMUNITY): Payer: Self-pay

## 2013-06-10 ENCOUNTER — Ambulatory Visit: Payer: Medicare Other | Admitting: Pharmacist

## 2013-06-11 ENCOUNTER — Encounter (HOSPITAL_COMMUNITY): Payer: Medicare Other

## 2013-06-11 DIAGNOSIS — I472 Ventricular tachycardia, unspecified: Secondary | ICD-10-CM | POA: Insufficient documentation

## 2013-06-11 DIAGNOSIS — I4729 Other ventricular tachycardia: Secondary | ICD-10-CM | POA: Insufficient documentation

## 2013-06-11 DIAGNOSIS — Z5189 Encounter for other specified aftercare: Secondary | ICD-10-CM | POA: Insufficient documentation

## 2013-06-11 DIAGNOSIS — N189 Chronic kidney disease, unspecified: Secondary | ICD-10-CM | POA: Insufficient documentation

## 2013-06-11 DIAGNOSIS — Z95810 Presence of automatic (implantable) cardiac defibrillator: Secondary | ICD-10-CM | POA: Insufficient documentation

## 2013-06-11 DIAGNOSIS — I359 Nonrheumatic aortic valve disorder, unspecified: Secondary | ICD-10-CM | POA: Insufficient documentation

## 2013-06-11 DIAGNOSIS — Z951 Presence of aortocoronary bypass graft: Secondary | ICD-10-CM | POA: Insufficient documentation

## 2013-06-11 DIAGNOSIS — I4891 Unspecified atrial fibrillation: Secondary | ICD-10-CM | POA: Insufficient documentation

## 2013-06-12 ENCOUNTER — Encounter (HOSPITAL_COMMUNITY): Payer: Self-pay

## 2013-06-13 ENCOUNTER — Ambulatory Visit (INDEPENDENT_AMBULATORY_CARE_PROVIDER_SITE_OTHER): Payer: Medicare Other | Admitting: Pharmacist

## 2013-06-13 DIAGNOSIS — I482 Chronic atrial fibrillation, unspecified: Secondary | ICD-10-CM

## 2013-06-13 DIAGNOSIS — Z7901 Long term (current) use of anticoagulants: Secondary | ICD-10-CM | POA: Diagnosis not present

## 2013-06-13 DIAGNOSIS — Z5181 Encounter for therapeutic drug level monitoring: Secondary | ICD-10-CM

## 2013-06-13 DIAGNOSIS — I4891 Unspecified atrial fibrillation: Secondary | ICD-10-CM

## 2013-06-13 LAB — POCT INR: INR: 2.5

## 2013-06-17 ENCOUNTER — Encounter (HOSPITAL_COMMUNITY): Payer: Self-pay

## 2013-06-18 ENCOUNTER — Encounter (HOSPITAL_COMMUNITY): Payer: Self-pay

## 2013-06-19 ENCOUNTER — Encounter (HOSPITAL_COMMUNITY): Payer: Self-pay

## 2013-06-20 DIAGNOSIS — K625 Hemorrhage of anus and rectum: Secondary | ICD-10-CM | POA: Diagnosis not present

## 2013-06-20 DIAGNOSIS — Z7901 Long term (current) use of anticoagulants: Secondary | ICD-10-CM | POA: Diagnosis not present

## 2013-06-20 DIAGNOSIS — K648 Other hemorrhoids: Secondary | ICD-10-CM | POA: Diagnosis not present

## 2013-06-24 ENCOUNTER — Encounter (HOSPITAL_COMMUNITY): Payer: Self-pay

## 2013-06-25 ENCOUNTER — Encounter (HOSPITAL_COMMUNITY): Payer: Self-pay

## 2013-06-26 ENCOUNTER — Encounter (HOSPITAL_COMMUNITY): Payer: Self-pay

## 2013-06-28 LAB — POCT INR: INR: 3

## 2013-06-30 ENCOUNTER — Ambulatory Visit (INDEPENDENT_AMBULATORY_CARE_PROVIDER_SITE_OTHER): Payer: Medicare Other | Admitting: Pharmacist

## 2013-06-30 DIAGNOSIS — I482 Chronic atrial fibrillation, unspecified: Secondary | ICD-10-CM

## 2013-06-30 DIAGNOSIS — I4891 Unspecified atrial fibrillation: Secondary | ICD-10-CM

## 2013-06-30 DIAGNOSIS — Z5181 Encounter for therapeutic drug level monitoring: Secondary | ICD-10-CM

## 2013-06-30 LAB — POCT INR: INR: 2.2

## 2013-07-01 ENCOUNTER — Encounter (HOSPITAL_COMMUNITY)
Admission: RE | Admit: 2013-07-01 | Discharge: 2013-07-01 | Disposition: A | Discharge status: Home / Self Care | Payer: Self-pay | Source: Ambulatory Visit | Attending: Cardiovascular Disease | Admitting: Cardiovascular Disease

## 2013-07-02 ENCOUNTER — Encounter (HOSPITAL_COMMUNITY): Payer: Self-pay

## 2013-07-03 ENCOUNTER — Encounter (HOSPITAL_COMMUNITY): Payer: Self-pay

## 2013-07-03 DIAGNOSIS — K649 Unspecified hemorrhoids: Secondary | ICD-10-CM | POA: Diagnosis not present

## 2013-07-08 ENCOUNTER — Encounter (HOSPITAL_COMMUNITY)
Admission: RE | Admit: 2013-07-08 | Discharge: 2013-07-08 | Disposition: A | Discharge status: Home / Self Care | Payer: Self-pay | Source: Ambulatory Visit | Attending: Cardiovascular Disease | Admitting: Cardiovascular Disease

## 2013-07-09 ENCOUNTER — Encounter (HOSPITAL_COMMUNITY): Payer: Self-pay

## 2013-07-10 ENCOUNTER — Encounter (HOSPITAL_COMMUNITY)
Admission: RE | Admit: 2013-07-10 | Discharge: 2013-07-10 | Disposition: A | Discharge status: Home / Self Care | Payer: Self-pay | Source: Ambulatory Visit | Attending: Cardiovascular Disease | Admitting: Cardiovascular Disease

## 2013-07-11 DIAGNOSIS — H2510 Age-related nuclear cataract, unspecified eye: Secondary | ICD-10-CM | POA: Diagnosis not present

## 2013-07-14 ENCOUNTER — Ambulatory Visit (INDEPENDENT_AMBULATORY_CARE_PROVIDER_SITE_OTHER): Payer: Medicare Other | Admitting: Cardiovascular Disease

## 2013-07-14 DIAGNOSIS — I4891 Unspecified atrial fibrillation: Secondary | ICD-10-CM | POA: Diagnosis not present

## 2013-07-14 DIAGNOSIS — I482 Chronic atrial fibrillation, unspecified: Secondary | ICD-10-CM

## 2013-07-14 DIAGNOSIS — Z5181 Encounter for therapeutic drug level monitoring: Secondary | ICD-10-CM

## 2013-07-14 DIAGNOSIS — Z7901 Long term (current) use of anticoagulants: Secondary | ICD-10-CM | POA: Diagnosis not present

## 2013-07-14 LAB — POCT INR: INR: 2.7

## 2013-07-15 ENCOUNTER — Encounter (HOSPITAL_COMMUNITY)
Admission: RE | Admit: 2013-07-15 | Discharge: 2013-07-15 | Disposition: A | Discharge status: Home / Self Care | Payer: Self-pay | Source: Ambulatory Visit | Attending: Cardiovascular Disease | Admitting: Cardiovascular Disease

## 2013-07-15 DIAGNOSIS — Z5189 Encounter for other specified aftercare: Secondary | ICD-10-CM | POA: Insufficient documentation

## 2013-07-15 DIAGNOSIS — Z95810 Presence of automatic (implantable) cardiac defibrillator: Secondary | ICD-10-CM | POA: Insufficient documentation

## 2013-07-15 DIAGNOSIS — I359 Nonrheumatic aortic valve disorder, unspecified: Secondary | ICD-10-CM | POA: Insufficient documentation

## 2013-07-15 DIAGNOSIS — I4729 Other ventricular tachycardia: Secondary | ICD-10-CM | POA: Insufficient documentation

## 2013-07-15 DIAGNOSIS — I472 Ventricular tachycardia, unspecified: Secondary | ICD-10-CM | POA: Insufficient documentation

## 2013-07-15 DIAGNOSIS — N189 Chronic kidney disease, unspecified: Secondary | ICD-10-CM | POA: Insufficient documentation

## 2013-07-15 DIAGNOSIS — I4891 Unspecified atrial fibrillation: Secondary | ICD-10-CM | POA: Insufficient documentation

## 2013-07-15 DIAGNOSIS — Z951 Presence of aortocoronary bypass graft: Secondary | ICD-10-CM | POA: Insufficient documentation

## 2013-07-16 ENCOUNTER — Encounter (HOSPITAL_COMMUNITY): Payer: Self-pay

## 2013-07-17 ENCOUNTER — Encounter (HOSPITAL_COMMUNITY): Payer: Self-pay

## 2013-07-22 ENCOUNTER — Telehealth (HOSPITAL_COMMUNITY): Payer: Self-pay | Admitting: *Deleted

## 2013-07-22 ENCOUNTER — Encounter (HOSPITAL_COMMUNITY): Admission: RE | Admit: 2013-07-22 | Payer: Self-pay | Source: Ambulatory Visit

## 2013-07-22 DIAGNOSIS — M25569 Pain in unspecified knee: Secondary | ICD-10-CM | POA: Diagnosis not present

## 2013-07-23 ENCOUNTER — Encounter (HOSPITAL_COMMUNITY): Payer: Self-pay

## 2013-07-24 ENCOUNTER — Encounter (HOSPITAL_COMMUNITY): Payer: Self-pay

## 2013-07-28 ENCOUNTER — Ambulatory Visit (INDEPENDENT_AMBULATORY_CARE_PROVIDER_SITE_OTHER): Payer: Medicare Other | Admitting: Cardiology

## 2013-07-28 DIAGNOSIS — J309 Allergic rhinitis, unspecified: Secondary | ICD-10-CM | POA: Diagnosis not present

## 2013-07-28 DIAGNOSIS — I4891 Unspecified atrial fibrillation: Secondary | ICD-10-CM | POA: Diagnosis not present

## 2013-07-28 DIAGNOSIS — I482 Chronic atrial fibrillation, unspecified: Secondary | ICD-10-CM

## 2013-07-28 DIAGNOSIS — R05 Cough: Secondary | ICD-10-CM | POA: Diagnosis not present

## 2013-07-28 DIAGNOSIS — Z5181 Encounter for therapeutic drug level monitoring: Secondary | ICD-10-CM

## 2013-07-28 DIAGNOSIS — D692 Other nonthrombocytopenic purpura: Secondary | ICD-10-CM | POA: Diagnosis not present

## 2013-07-28 DIAGNOSIS — R059 Cough, unspecified: Secondary | ICD-10-CM | POA: Diagnosis not present

## 2013-07-28 DIAGNOSIS — L570 Actinic keratosis: Secondary | ICD-10-CM | POA: Diagnosis not present

## 2013-07-28 DIAGNOSIS — Z85828 Personal history of other malignant neoplasm of skin: Secondary | ICD-10-CM | POA: Diagnosis not present

## 2013-07-28 DIAGNOSIS — IMO0002 Reserved for concepts with insufficient information to code with codable children: Secondary | ICD-10-CM | POA: Diagnosis not present

## 2013-07-28 LAB — POCT INR: INR: 2.4

## 2013-07-29 ENCOUNTER — Encounter (HOSPITAL_COMMUNITY): Payer: Self-pay

## 2013-07-30 ENCOUNTER — Encounter (HOSPITAL_COMMUNITY): Payer: Self-pay

## 2013-07-31 ENCOUNTER — Encounter (HOSPITAL_COMMUNITY): Payer: Self-pay

## 2013-08-05 ENCOUNTER — Encounter (HOSPITAL_COMMUNITY): Payer: Self-pay

## 2013-08-06 ENCOUNTER — Encounter (HOSPITAL_COMMUNITY): Payer: Self-pay

## 2013-08-07 ENCOUNTER — Encounter (HOSPITAL_COMMUNITY): Payer: Self-pay

## 2013-08-11 ENCOUNTER — Ambulatory Visit (INDEPENDENT_AMBULATORY_CARE_PROVIDER_SITE_OTHER): Payer: Medicare Other | Admitting: Pharmacist

## 2013-08-11 DIAGNOSIS — Z5181 Encounter for therapeutic drug level monitoring: Secondary | ICD-10-CM

## 2013-08-11 DIAGNOSIS — I4891 Unspecified atrial fibrillation: Secondary | ICD-10-CM

## 2013-08-11 DIAGNOSIS — I482 Chronic atrial fibrillation, unspecified: Secondary | ICD-10-CM

## 2013-08-11 LAB — POCT INR: INR: 2.9

## 2013-08-12 ENCOUNTER — Encounter (HOSPITAL_COMMUNITY): Payer: Self-pay | Attending: Cardiovascular Disease

## 2013-08-12 ENCOUNTER — Other Ambulatory Visit: Payer: Self-pay | Admitting: Cardiology

## 2013-08-12 ENCOUNTER — Other Ambulatory Visit (INDEPENDENT_AMBULATORY_CARE_PROVIDER_SITE_OTHER): Payer: Medicare Other

## 2013-08-12 DIAGNOSIS — E785 Hyperlipidemia, unspecified: Secondary | ICD-10-CM

## 2013-08-12 DIAGNOSIS — I359 Nonrheumatic aortic valve disorder, unspecified: Secondary | ICD-10-CM | POA: Insufficient documentation

## 2013-08-12 DIAGNOSIS — Z95810 Presence of automatic (implantable) cardiac defibrillator: Secondary | ICD-10-CM | POA: Insufficient documentation

## 2013-08-12 DIAGNOSIS — Z951 Presence of aortocoronary bypass graft: Secondary | ICD-10-CM | POA: Insufficient documentation

## 2013-08-12 DIAGNOSIS — N189 Chronic kidney disease, unspecified: Secondary | ICD-10-CM | POA: Insufficient documentation

## 2013-08-12 DIAGNOSIS — I4891 Unspecified atrial fibrillation: Secondary | ICD-10-CM | POA: Insufficient documentation

## 2013-08-12 DIAGNOSIS — Z5189 Encounter for other specified aftercare: Secondary | ICD-10-CM | POA: Insufficient documentation

## 2013-08-12 DIAGNOSIS — I472 Ventricular tachycardia, unspecified: Secondary | ICD-10-CM | POA: Insufficient documentation

## 2013-08-12 DIAGNOSIS — I4729 Other ventricular tachycardia: Secondary | ICD-10-CM | POA: Insufficient documentation

## 2013-08-12 LAB — LIPID PANEL
CHOL/HDL RATIO: 3
Cholesterol: 142 mg/dL (ref 0–200)
HDL: 48.7 mg/dL (ref >39.00)
LDL Cholesterol: 84 mg/dL (ref 0–99)
Triglycerides: 48 mg/dL (ref 0.0–149.0)
VLDL: 9.6 mg/dL (ref 0.0–40.0)

## 2013-08-12 LAB — HEPATIC FUNCTION PANEL
ALT: 17 U/L (ref 0–53)
AST: 19 U/L (ref 0–37)
Albumin: 3.7 g/dL (ref 3.5–5.2)
Alkaline Phosphatase: 53 U/L (ref 39–117)
Bilirubin, Direct: 0.2 mg/dL (ref 0.0–0.3)
Total Bilirubin: 1.3 mg/dL — ABNORMAL HIGH (ref 0.2–1.2)
Total Protein: 6.1 g/dL (ref 6.0–8.3)

## 2013-08-13 ENCOUNTER — Encounter (HOSPITAL_COMMUNITY): Payer: Medicare Other

## 2013-08-14 ENCOUNTER — Encounter (HOSPITAL_COMMUNITY): Payer: Self-pay

## 2013-08-19 ENCOUNTER — Encounter (HOSPITAL_COMMUNITY): Payer: Self-pay

## 2013-08-20 ENCOUNTER — Ambulatory Visit (INDEPENDENT_AMBULATORY_CARE_PROVIDER_SITE_OTHER): Payer: Medicare Other | Admitting: *Deleted

## 2013-08-20 ENCOUNTER — Encounter (HOSPITAL_COMMUNITY): Payer: Self-pay

## 2013-08-20 DIAGNOSIS — I2589 Other forms of chronic ischemic heart disease: Secondary | ICD-10-CM | POA: Diagnosis not present

## 2013-08-20 DIAGNOSIS — I255 Ischemic cardiomyopathy: Secondary | ICD-10-CM

## 2013-08-20 LAB — MDC_IDC_ENUM_SESS_TYPE_REMOTE
Battery Voltage: 3.08 V
Date Time Interrogation Session: 20150610142315
HIGH POWER IMPEDANCE MEASURED VALUE: 45 Ohm
HIGH POWER IMPEDANCE MEASURED VALUE: 58 Ohm
HighPow Impedance: 228 Ohm
Lead Channel Pacing Threshold Amplitude: 1 V
Lead Channel Pacing Threshold Pulse Width: 0.4 ms
Lead Channel Sensing Intrinsic Amplitude: 17.5 mV
Lead Channel Sensing Intrinsic Amplitude: 17.5 mV
Lead Channel Setting Pacing Pulse Width: 0.4 ms
MDC IDC MSMT BATTERY REMAINING LONGEVITY: 134 mo
MDC IDC MSMT LEADCHNL RV IMPEDANCE VALUE: 589 Ohm
MDC IDC SET LEADCHNL RV PACING AMPLITUDE: 2 V
MDC IDC SET LEADCHNL RV SENSING SENSITIVITY: 0.3 mV
MDC IDC SET ZONE DETECTION INTERVAL: 400 ms
MDC IDC STAT BRADY RV PERCENT PACED: 4.78 %
Zone Setting Detection Interval: 300 ms
Zone Setting Detection Interval: 450 ms

## 2013-08-20 NOTE — Progress Notes (Signed)
Remote ICD transmission.   

## 2013-08-21 ENCOUNTER — Encounter (HOSPITAL_COMMUNITY): Payer: Self-pay

## 2013-08-25 ENCOUNTER — Ambulatory Visit (INDEPENDENT_AMBULATORY_CARE_PROVIDER_SITE_OTHER): Payer: Medicare Other | Admitting: Interventional Cardiology

## 2013-08-25 DIAGNOSIS — I482 Chronic atrial fibrillation, unspecified: Secondary | ICD-10-CM

## 2013-08-25 DIAGNOSIS — I4891 Unspecified atrial fibrillation: Secondary | ICD-10-CM | POA: Diagnosis not present

## 2013-08-25 DIAGNOSIS — Z7901 Long term (current) use of anticoagulants: Secondary | ICD-10-CM | POA: Diagnosis not present

## 2013-08-25 DIAGNOSIS — Z5181 Encounter for therapeutic drug level monitoring: Secondary | ICD-10-CM

## 2013-08-25 LAB — POCT INR: INR: 3

## 2013-08-26 ENCOUNTER — Encounter (HOSPITAL_COMMUNITY): Payer: Self-pay

## 2013-08-27 ENCOUNTER — Encounter (HOSPITAL_COMMUNITY): Payer: Self-pay

## 2013-08-28 ENCOUNTER — Encounter (HOSPITAL_COMMUNITY): Payer: Self-pay

## 2013-09-02 ENCOUNTER — Encounter (HOSPITAL_COMMUNITY): Payer: Self-pay

## 2013-09-03 ENCOUNTER — Encounter (HOSPITAL_COMMUNITY): Payer: Self-pay

## 2013-09-04 ENCOUNTER — Encounter (HOSPITAL_COMMUNITY): Payer: Self-pay

## 2013-09-08 ENCOUNTER — Ambulatory Visit (INDEPENDENT_AMBULATORY_CARE_PROVIDER_SITE_OTHER): Payer: Medicare Other | Admitting: *Deleted

## 2013-09-08 DIAGNOSIS — Z5181 Encounter for therapeutic drug level monitoring: Secondary | ICD-10-CM

## 2013-09-08 DIAGNOSIS — I4891 Unspecified atrial fibrillation: Secondary | ICD-10-CM

## 2013-09-08 DIAGNOSIS — I482 Chronic atrial fibrillation, unspecified: Secondary | ICD-10-CM

## 2013-09-08 LAB — POCT INR: INR: 2.7

## 2013-09-09 ENCOUNTER — Encounter (HOSPITAL_COMMUNITY): Payer: Self-pay

## 2013-09-16 ENCOUNTER — Encounter: Payer: Self-pay | Admitting: Cardiology

## 2013-09-17 ENCOUNTER — Encounter (HOSPITAL_COMMUNITY): Payer: Self-pay | Attending: Cardiovascular Disease

## 2013-09-17 DIAGNOSIS — I4891 Unspecified atrial fibrillation: Secondary | ICD-10-CM | POA: Insufficient documentation

## 2013-09-17 DIAGNOSIS — Z7901 Long term (current) use of anticoagulants: Secondary | ICD-10-CM | POA: Insufficient documentation

## 2013-09-18 ENCOUNTER — Encounter (HOSPITAL_COMMUNITY): Payer: Self-pay

## 2013-09-18 ENCOUNTER — Encounter: Payer: Self-pay | Admitting: Cardiology

## 2013-09-22 ENCOUNTER — Ambulatory Visit (INDEPENDENT_AMBULATORY_CARE_PROVIDER_SITE_OTHER): Payer: Medicare Other | Admitting: Internal Medicine

## 2013-09-22 DIAGNOSIS — Z5181 Encounter for therapeutic drug level monitoring: Secondary | ICD-10-CM

## 2013-09-22 DIAGNOSIS — I4891 Unspecified atrial fibrillation: Secondary | ICD-10-CM

## 2013-09-22 DIAGNOSIS — I482 Chronic atrial fibrillation, unspecified: Secondary | ICD-10-CM

## 2013-09-22 LAB — POCT INR: INR: 2.5

## 2013-09-23 ENCOUNTER — Encounter: Payer: Self-pay | Admitting: Internal Medicine

## 2013-09-23 ENCOUNTER — Encounter (HOSPITAL_COMMUNITY): Payer: Self-pay

## 2013-09-24 ENCOUNTER — Encounter (HOSPITAL_COMMUNITY): Payer: Self-pay

## 2013-09-25 ENCOUNTER — Encounter (HOSPITAL_COMMUNITY): Payer: Self-pay

## 2013-09-29 ENCOUNTER — Other Ambulatory Visit: Payer: Self-pay | Admitting: Cardiovascular Disease

## 2013-09-30 ENCOUNTER — Encounter (HOSPITAL_COMMUNITY): Payer: Self-pay

## 2013-10-01 ENCOUNTER — Encounter (HOSPITAL_COMMUNITY): Payer: Self-pay

## 2013-10-02 ENCOUNTER — Encounter (HOSPITAL_COMMUNITY): Payer: Self-pay

## 2013-10-06 ENCOUNTER — Ambulatory Visit (INDEPENDENT_AMBULATORY_CARE_PROVIDER_SITE_OTHER): Payer: Medicare Other | Admitting: Cardiology

## 2013-10-06 DIAGNOSIS — Z5181 Encounter for therapeutic drug level monitoring: Secondary | ICD-10-CM

## 2013-10-06 DIAGNOSIS — I482 Chronic atrial fibrillation, unspecified: Secondary | ICD-10-CM

## 2013-10-06 DIAGNOSIS — I4891 Unspecified atrial fibrillation: Secondary | ICD-10-CM

## 2013-10-06 LAB — POCT INR: INR: 2.8

## 2013-10-07 ENCOUNTER — Encounter (HOSPITAL_COMMUNITY): Payer: Self-pay

## 2013-10-08 ENCOUNTER — Encounter (HOSPITAL_COMMUNITY): Payer: Self-pay

## 2013-10-09 ENCOUNTER — Encounter (HOSPITAL_COMMUNITY): Payer: Self-pay

## 2013-10-09 DIAGNOSIS — I4891 Unspecified atrial fibrillation: Secondary | ICD-10-CM | POA: Diagnosis not present

## 2013-10-09 DIAGNOSIS — M545 Low back pain, unspecified: Secondary | ICD-10-CM | POA: Diagnosis not present

## 2013-10-09 DIAGNOSIS — R5381 Other malaise: Secondary | ICD-10-CM | POA: Diagnosis not present

## 2013-10-09 DIAGNOSIS — E039 Hypothyroidism, unspecified: Secondary | ICD-10-CM | POA: Diagnosis not present

## 2013-10-09 DIAGNOSIS — E559 Vitamin D deficiency, unspecified: Secondary | ICD-10-CM | POA: Diagnosis not present

## 2013-10-09 DIAGNOSIS — I252 Old myocardial infarction: Secondary | ICD-10-CM | POA: Diagnosis not present

## 2013-10-09 DIAGNOSIS — E785 Hyperlipidemia, unspecified: Secondary | ICD-10-CM | POA: Diagnosis not present

## 2013-10-09 DIAGNOSIS — R5383 Other fatigue: Secondary | ICD-10-CM | POA: Diagnosis not present

## 2013-10-09 DIAGNOSIS — M810 Age-related osteoporosis without current pathological fracture: Secondary | ICD-10-CM | POA: Diagnosis not present

## 2013-10-10 ENCOUNTER — Emergency Department (HOSPITAL_COMMUNITY)
Admission: EM | Admit: 2013-10-10 | Discharge: 2013-10-10 | Discharge status: Against Medical Advice | Payer: Medicare Other | Attending: Emergency Medicine | Admitting: Emergency Medicine

## 2013-10-10 ENCOUNTER — Encounter (HOSPITAL_COMMUNITY): Payer: Self-pay | Admitting: Emergency Medicine

## 2013-10-10 DIAGNOSIS — I2510 Atherosclerotic heart disease of native coronary artery without angina pectoris: Secondary | ICD-10-CM | POA: Insufficient documentation

## 2013-10-10 DIAGNOSIS — K219 Gastro-esophageal reflux disease without esophagitis: Secondary | ICD-10-CM | POA: Insufficient documentation

## 2013-10-10 DIAGNOSIS — R011 Cardiac murmur, unspecified: Secondary | ICD-10-CM | POA: Insufficient documentation

## 2013-10-10 DIAGNOSIS — M1990 Unspecified osteoarthritis, unspecified site: Secondary | ICD-10-CM | POA: Insufficient documentation

## 2013-10-10 DIAGNOSIS — M4712 Other spondylosis with myelopathy, cervical region: Secondary | ICD-10-CM | POA: Diagnosis not present

## 2013-10-10 DIAGNOSIS — R109 Unspecified abdominal pain: Secondary | ICD-10-CM | POA: Insufficient documentation

## 2013-10-10 DIAGNOSIS — Z7901 Long term (current) use of anticoagulants: Secondary | ICD-10-CM | POA: Diagnosis not present

## 2013-10-10 DIAGNOSIS — Z87891 Personal history of nicotine dependence: Secondary | ICD-10-CM | POA: Insufficient documentation

## 2013-10-10 DIAGNOSIS — Z792 Long term (current) use of antibiotics: Secondary | ICD-10-CM | POA: Diagnosis not present

## 2013-10-10 DIAGNOSIS — Z8673 Personal history of transient ischemic attack (TIA), and cerebral infarction without residual deficits: Secondary | ICD-10-CM | POA: Insufficient documentation

## 2013-10-10 DIAGNOSIS — Z951 Presence of aortocoronary bypass graft: Secondary | ICD-10-CM | POA: Diagnosis not present

## 2013-10-10 DIAGNOSIS — I4891 Unspecified atrial fibrillation: Secondary | ICD-10-CM | POA: Insufficient documentation

## 2013-10-10 DIAGNOSIS — Z9889 Other specified postprocedural states: Secondary | ICD-10-CM | POA: Insufficient documentation

## 2013-10-10 DIAGNOSIS — Z86711 Personal history of pulmonary embolism: Secondary | ICD-10-CM | POA: Insufficient documentation

## 2013-10-10 DIAGNOSIS — Z7982 Long term (current) use of aspirin: Secondary | ICD-10-CM | POA: Insufficient documentation

## 2013-10-10 DIAGNOSIS — I252 Old myocardial infarction: Secondary | ICD-10-CM | POA: Insufficient documentation

## 2013-10-10 DIAGNOSIS — Z79899 Other long term (current) drug therapy: Secondary | ICD-10-CM | POA: Insufficient documentation

## 2013-10-10 DIAGNOSIS — E785 Hyperlipidemia, unspecified: Secondary | ICD-10-CM | POA: Insufficient documentation

## 2013-10-10 LAB — CBC WITH DIFFERENTIAL/PLATELET
BASOS PCT: 0 % (ref 0–1)
Basophils Absolute: 0 10*3/uL (ref 0.0–0.1)
Eosinophils Absolute: 0.3 10*3/uL (ref 0.0–0.7)
Eosinophils Relative: 4 % (ref 0–5)
HEMATOCRIT: 40.7 % (ref 39.0–52.0)
HEMOGLOBIN: 13.5 g/dL (ref 13.0–17.0)
Lymphocytes Relative: 14 % (ref 12–46)
Lymphs Abs: 1 10*3/uL (ref 0.7–4.0)
MCH: 30.7 pg (ref 26.0–34.0)
MCHC: 33.2 g/dL (ref 30.0–36.0)
MCV: 92.5 fL (ref 78.0–100.0)
Monocytes Absolute: 0.7 10*3/uL (ref 0.1–1.0)
Monocytes Relative: 11 % (ref 3–12)
NEUTROS ABS: 4.8 10*3/uL (ref 1.7–7.7)
NEUTROS PCT: 71 % (ref 43–77)
Platelets: 190 10*3/uL (ref 150–400)
RBC: 4.4 MIL/uL (ref 4.22–5.81)
RDW: 13.5 % (ref 11.5–15.5)
WBC: 6.8 10*3/uL (ref 4.0–10.5)

## 2013-10-10 LAB — COMPREHENSIVE METABOLIC PANEL
ALBUMIN: 3.6 g/dL (ref 3.5–5.2)
ALT: 13 U/L (ref 0–53)
AST: 20 U/L (ref 0–37)
Alkaline Phosphatase: 68 U/L (ref 39–117)
Anion gap: 8 (ref 5–15)
BUN: 14 mg/dL (ref 6–23)
CHLORIDE: 98 meq/L (ref 96–112)
CO2: 28 mEq/L (ref 19–32)
CREATININE: 1.03 mg/dL (ref 0.50–1.35)
Calcium: 8.6 mg/dL (ref 8.4–10.5)
GFR calc Af Amer: 78 mL/min — ABNORMAL LOW (ref >90)
GFR calc non Af Amer: 67 mL/min — ABNORMAL LOW (ref >90)
Glucose, Bld: 83 mg/dL (ref 70–99)
Potassium: 4.5 mEq/L (ref 3.7–5.3)
SODIUM: 134 meq/L — AB (ref 137–147)
Total Bilirubin: 0.7 mg/dL (ref 0.3–1.2)
Total Protein: 6.1 g/dL (ref 6.0–8.3)

## 2013-10-10 NOTE — ED Notes (Signed)
Pt states he is leaving due to wait, encouraged to stay

## 2013-10-10 NOTE — ED Notes (Signed)
Pt presents lower abd pain, hemorrhoid, and constipation starting this week. Pt reports a hx of diverticulosis, pt denies rectal bleeding, nausea, vomiting, and or diarrhea. Pt's pcp sent pt here for follow up

## 2013-10-14 ENCOUNTER — Encounter (HOSPITAL_COMMUNITY): Payer: Medicare Other | Attending: Cardiovascular Disease

## 2013-10-14 DIAGNOSIS — M810 Age-related osteoporosis without current pathological fracture: Secondary | ICD-10-CM | POA: Diagnosis not present

## 2013-10-14 DIAGNOSIS — Z029 Encounter for administrative examinations, unspecified: Secondary | ICD-10-CM | POA: Insufficient documentation

## 2013-10-15 ENCOUNTER — Encounter (HOSPITAL_COMMUNITY): Payer: Self-pay

## 2013-10-16 ENCOUNTER — Encounter (HOSPITAL_COMMUNITY): Payer: Self-pay

## 2013-10-20 ENCOUNTER — Emergency Department (HOSPITAL_COMMUNITY): Payer: Medicare Other

## 2013-10-20 ENCOUNTER — Emergency Department (HOSPITAL_COMMUNITY)
Admission: EM | Admit: 2013-10-20 | Discharge: 2013-10-20 | Disposition: A | Discharge status: Home / Self Care | Payer: Medicare Other | Attending: Emergency Medicine | Admitting: Emergency Medicine

## 2013-10-20 ENCOUNTER — Encounter (HOSPITAL_COMMUNITY): Payer: Self-pay | Admitting: Emergency Medicine

## 2013-10-20 DIAGNOSIS — K219 Gastro-esophageal reflux disease without esophagitis: Secondary | ICD-10-CM | POA: Diagnosis not present

## 2013-10-20 DIAGNOSIS — M549 Dorsalgia, unspecified: Secondary | ICD-10-CM | POA: Diagnosis not present

## 2013-10-20 DIAGNOSIS — M25559 Pain in unspecified hip: Secondary | ICD-10-CM | POA: Diagnosis not present

## 2013-10-20 DIAGNOSIS — G8929 Other chronic pain: Secondary | ICD-10-CM | POA: Insufficient documentation

## 2013-10-20 DIAGNOSIS — M25552 Pain in left hip: Secondary | ICD-10-CM

## 2013-10-20 DIAGNOSIS — M1990 Unspecified osteoarthritis, unspecified site: Secondary | ICD-10-CM | POA: Insufficient documentation

## 2013-10-20 DIAGNOSIS — Z8546 Personal history of malignant neoplasm of prostate: Secondary | ICD-10-CM | POA: Insufficient documentation

## 2013-10-20 DIAGNOSIS — I252 Old myocardial infarction: Secondary | ICD-10-CM | POA: Insufficient documentation

## 2013-10-20 DIAGNOSIS — Z8673 Personal history of transient ischemic attack (TIA), and cerebral infarction without residual deficits: Secondary | ICD-10-CM | POA: Diagnosis not present

## 2013-10-20 DIAGNOSIS — R011 Cardiac murmur, unspecified: Secondary | ICD-10-CM | POA: Insufficient documentation

## 2013-10-20 DIAGNOSIS — M169 Osteoarthritis of hip, unspecified: Secondary | ICD-10-CM | POA: Diagnosis not present

## 2013-10-20 DIAGNOSIS — Z880 Allergy status to penicillin: Secondary | ICD-10-CM | POA: Insufficient documentation

## 2013-10-20 DIAGNOSIS — Z7982 Long term (current) use of aspirin: Secondary | ICD-10-CM | POA: Diagnosis not present

## 2013-10-20 DIAGNOSIS — E785 Hyperlipidemia, unspecified: Secondary | ICD-10-CM | POA: Diagnosis not present

## 2013-10-20 DIAGNOSIS — Z87891 Personal history of nicotine dependence: Secondary | ICD-10-CM | POA: Diagnosis not present

## 2013-10-20 DIAGNOSIS — M1610 Unilateral primary osteoarthritis, unspecified hip: Secondary | ICD-10-CM | POA: Diagnosis not present

## 2013-10-20 DIAGNOSIS — Z79899 Other long term (current) drug therapy: Secondary | ICD-10-CM | POA: Diagnosis not present

## 2013-10-20 DIAGNOSIS — R6889 Other general symptoms and signs: Secondary | ICD-10-CM | POA: Diagnosis not present

## 2013-10-20 DIAGNOSIS — R52 Pain, unspecified: Secondary | ICD-10-CM | POA: Diagnosis not present

## 2013-10-20 DIAGNOSIS — Z7901 Long term (current) use of anticoagulants: Secondary | ICD-10-CM | POA: Diagnosis not present

## 2013-10-20 DIAGNOSIS — M25529 Pain in unspecified elbow: Secondary | ICD-10-CM | POA: Diagnosis not present

## 2013-10-20 MED ORDER — BUPIVACAINE HCL (PF) 0.5 % IJ SOLN
10.0000 mL | Freq: Once | INTRAMUSCULAR | Status: AC
  Administered 2013-10-20: 10 mL
  Filled 2013-10-20: qty 10

## 2013-10-20 MED ORDER — HYDROCODONE-ACETAMINOPHEN 5-325 MG PO TABS: 1.0000 | ORAL_TABLET | ORAL | Status: DC | PRN

## 2013-10-20 MED ORDER — OXYCODONE-ACETAMINOPHEN 5-325 MG PO TABS
1.0000 | ORAL_TABLET | Freq: Once | ORAL | Status: AC
  Administered 2013-10-20: 1 via ORAL
  Filled 2013-10-20: qty 1

## 2013-10-20 NOTE — ED Notes (Signed)
TO CT

## 2013-10-20 NOTE — ED Notes (Signed)
Call placed to social worker message left to return call

## 2013-10-20 NOTE — ED Notes (Signed)
RETURNED FROM CT

## 2013-10-20 NOTE — ED Notes (Signed)
Attempted to ambulate pt. Pt can stand with assistance, pt pain increases. Pt unable to ambulate.

## 2013-10-20 NOTE — ED Provider Notes (Signed)
CSN: [id number]     Arrival date & time 10/20/13  9562 History   First MD Initiated Contact with Patient 10/20/13 [phone]     Chief Complaint  Patient presents with  . Hip Pain    Left Hip Pain     (Consider location/radiation/quality/duration/timing/severity/associated sxs/prior Treatment) Patient is a 80 y.o. male presenting with hip pain.  Hip Pain Associated symptoms include arthralgias. Pertinent negatives include no chest pain, chills, diaphoresis, fever, joint swelling, myalgias, nausea or vomiting.    Mr Garden is a 80 year old man with a fib, ischemic cardiomyopathy, several MIs s/p CABG x 2, s/p aortic valve replacement and PCI who presents w 3 days of L hip pain. Of note, the patient had a spine fracture 16 months ago. He was in his usual state of health until last Friday 8/7. He was straining for a bowel movement (had not had one in six days) when he acutely felt lateral hip pain. It has gotten progressively worse since. He describes it as aching and sometimes sharp, tylenol makes it a little better and bearing weight makes it worse (he walks with cane while living with wife). It is non-radiating. He denies any trauma or falls.   Past Medical History  Diagnosis Date  . Ischemic cardiomyopathy 05/2010    Has EF of 25%  . Diverticulitis     CURRENTLY CONTROLLED WITH NO EVIDENCE OF RECURRENT INFECTION  . AF (atrial fibrillation)     Has not tolerated amiodarone in the past. Amiodarone was stopped in September of 2010 due to side effects  . Chronic anticoagulation     on coumadin  . Prostate cancer   . Osteoarthritis     RIGHT KNEE  . Aortic stenosis, severe     WITH PERCUTANEOUS AORTIC VALVE (TAVI) IN March 2012 at the [facility]  . MI (myocardial infarction) 1974, 1978  . Cervical myelopathy   . Pulmonary embolism   . Hyperlipidemia   . GERD (gastroesophageal reflux disease)   . Esophageal stricture     WITH DILATATION  . NSVT (nonsustained ventricular tachycardia)    . Coronary artery disease   . Stroke   . Heart murmur    Past Surgical History  Procedure Laterality Date  . Icd  Feb 2003; 02/2013    gen change 02-11-2013 by Dr Caryl Comes  . Coronary artery bypass graft  1979  . Coronary artery bypass graft  1991    REDO SURGERY  . Cardiac catheterization  2010    SEVERE LV DYSFUNCTION WITH ESTIMATED EJECTION FRACTION OF 25%  . Cholecystectomy    . Aortic valve replacement      Percutaneous AVR in March 2012 at the [facility]  . Cardioversion  02/16/2011    Procedure: CARDIOVERSION;  Surgeon: Carlena Bjornstad, MD;  Location: [place];  Service: Cardiovascular;  Laterality: N/A;  . Shoulder surgery    . Back surgery     Family History  Problem Relation Age of Onset  . Heart disease Mother 29  . Diabetes Mother 62  . Heart disease Father 31   History  Substance Use Topics  . Smoking status: Former Smoker -- 4.00 packs/day for 15 years    Types: Cigarettes    Quit date: 06/15/1969  . Smokeless tobacco: Never Used  . Alcohol Use: No    Review of Systems  Constitutional: Positive for activity change. Negative for fever, chills, diaphoresis and unexpected weight change.  Respiratory: Negative for shortness of breath.  Cardiovascular: Negative for chest pain and palpitations.  Gastrointestinal: Negative for nausea, vomiting, diarrhea and constipation.  Musculoskeletal: Positive for arthralgias and back pain. Negative for joint swelling and myalgias.      Allergies  Penicillins; Ace inhibitors; Antihistamines, diphenhydramine-type; Heparin; and Amiodarone  Home Medications   Prior to Admission medications   Medication Sig Start Date End Date Taking? Authorizing Provider  acetaminophen (TYLENOL) 500 MG tablet Take 1,000 mg by mouth every 6 (six) hours as needed.   Yes Historical Provider, MD  alfuzosin (UROXATRAL) 10 MG 24 hr tablet Take 10 mg by mouth at bedtime.    Yes Historical Provider, MD  aspirin 81 MG chewable tablet Chew 81 mg  by mouth daily.    Yes Historical Provider, MD  atorvastatin (LIPITOR) 10 MG tablet Take 10 mg by mouth every other day.    Yes Historical Provider, MD  carvedilol (COREG) 6.25 MG tablet Take 6.25 mg by mouth 2 (two) times daily with a meal.   Yes Historical Provider, MD  cholecalciferol (VITAMIN D) 1000 UNITS tablet Take 1,000 Units by mouth daily.   Yes Historical Provider, MD  levothyroxine (SYNTHROID, LEVOTHROID) 150 MCG tablet Take 150 mcg by mouth daily before breakfast.   Yes Historical Provider, MD  Multiple Vitamins-Minerals (MULTIVITAMIN PO) Take 1 tablet by mouth daily.   Yes Historical Provider, MD  nitroGLYCERIN (NITROSTAT) 0.4 MG SL tablet Place 0.4 mg under the tongue every 5 (five) minutes as needed for chest pain.   Yes Historical Provider, MD  omeprazole (PRILOSEC) 20 MG capsule Take 20 mg by mouth daily as needed (acid reflux).   Yes Historical Provider, MD  polyethylene glycol (MIRALAX / GLYCOLAX) packet Take 17 g by mouth daily as needed (constipation).   Yes Historical Provider, MD  RESTASIS 0.05 % ophthalmic emulsion Place 1 drop into both eyes every 12 (twelve) hours.  12/05/11  Yes Historical Provider, MD  warfarin (COUMADIN) 5 MG tablet Take 5 mg by mouth daily.   Yes Historical Provider, MD  HYDROcodone-acetaminophen (NORCO) 5-325 MG per tablet Take 1-2 tablets by mouth every 4 (four) hours as needed. 10/20/13   Mariea Clonts, MD   BP 124/55  Pulse 62  Temp(Src) 97.5 F (36.4 C) (Oral)  Resp 16  Ht 6\' 3"  (1.905 m)  Wt 190 lb (86.183 kg)  BMI 23.75 kg/m2  SpO2 99% Physical Exam  Constitutional: He is oriented to person, place, and time. He appears well-developed and well-nourished. No distress.  HENT:  Head: Normocephalic and atraumatic.  Mouth/Throat: Oropharynx is clear and moist.  Eyes: EOM are normal. Pupils are equal, round, and reactive to light.  Cardiovascular: Intact distal pulses.   Murmur heard. Irregularly irregular HR  Pulmonary/Chest: Effort  normal and breath sounds normal.  Abdominal: Soft. Bowel sounds are normal. He exhibits no distension. There is no tenderness.  Musculoskeletal: He exhibits no edema and no tenderness.  Full ROM in hips,5/5 strength, and sensation intact bilaterally. Pain w L hip flexion >90 degrees localized to the greater trochanter  Neurological: He is alert and oriented to person, place, and time.  Skin: He is not diaphoretic. No erythema.    ED Course  Procedures (including critical care time) Labs Review Labs Reviewed - No data to display  Imaging Review Dg Hip Complete Left  10/20/2013   CLINICAL DATA:  Left hip pain.  EXAM: LEFT HIP - COMPLETE 2+ VIEW  COMPARISON:  None.  FINDINGS: There is no evidence of hip fracture or dislocation. There  is no evidence of arthropathy or other focal bone abnormality.  IMPRESSION: Normal exam.   Electronically Signed   By: Rozetta Nunnery M.D.   On: 10/20/2013 09:49   Ct Hip Left Wo Contrast  10/20/2013   CLINICAL DATA:  LEFT hip pain.  EXAM: CT OF THE LEFT HIP WITHOUT CONTRAST  TECHNIQUE: Multidetector CT imaging was performed according to the standard protocol. Multiplanar CT image reconstructions were also generated.  COMPARISON:  Radiographs 10/20/2013.  FINDINGS: No displaced hip fracture. The pelvic rings appear intact. Pubic symphysis degenerative disease. Visualized sacrum appears within normal limits. Visualized SI joints show mild degenerative changes. No hip effusion is identified. Mild RIGHT hip osteoarthritis with chondrocalcinosis of the femoral head articular cartilage. Pubic symphysis degenerative disease. Diffuse osteopenia. Scattered bone islands are present. Prostate brachytherapy seeds are present. Urinary bladder appears normal. Colonic diverticulosis. Aortoiliac atherosclerosis. Gluteal muscles appear symmetric. There soft tissue stranding lateral to the LEFT hip, likely representing contusion.  IMPRESSION: No acute osseous abnormality. Mild RIGHT hip  osteoarthritis. No displaced fracture.   Electronically Signed   By: Dereck Ligas M.D.   On: 10/20/2013 15:59     EKG Interpretation None      MDM   Final diagnoses:  Hip pain, acute, left    9:22AM: Acute onset L hip pain that is progressively worse over past three days. It is localized to the greater trochanter and non-radiating. He denies trauma, does not appear septic on exam. Differential includes trochanteric bursitis v occult hip fracture. Will obtain L hip film and give percocet x 1 to allow for ambulation assessment. Also consider bupivacaine injection pending reassessment.  10:59AM: L hip film negative. He thinks percocet has helped with pain. Patient understands likely musculoskeletal and should follow-up with his orthopedist. Saunders Revel bupivacaine injection explained and patient is amenable to injection.  11:15AM: ~5cc bupivacaine administered by Dr. Elnora Morrison. Will reassess pain and ambulation.  12:10PM: Patient able to ambulate a few steps with some pain after second percocet and bupivacaine injection. He will transfer to C pod. PT consult, social work consult re: home health.  2:33PM: Patient still cannot tolerate ambulation so will get CT wo contrast. Patient still waiting to be seen by PT.  3:35PM: Patient evaluated by PT who recommends home health PT. Social work on Mining engineer. L hip CT pending.  4:09PM: No acute osseous abnormality and mild R hip osteoarthritis. Patient clear for discharge.  Kelby Aline, MD 10/20/13 [phone]

## 2013-10-20 NOTE — Discharge Instructions (Signed)
If you were given medicines take as directed.  If you are on coumadin or contraceptives realize their levels and effectiveness is altered by many different medicines.  If you have any reaction (rash, tongues swelling, other) to the medicines stop taking and see a physician.   Please follow up as directed and return to the ER or see a physician for new or worsening symptoms.  Thank you. Filed Vitals:   10/20/13 0830 10/20/13 0900 10/20/13 1000 10/20/13 1111  BP: 129/66 113/79 107/55 124/55  Pulse: 75 71 65 62  Temp:      TempSrc:      Resp: 13 16 18 16   Height:      Weight:      SpO2: 98% 99% 98% 99%   Home health will assist.

## 2013-10-20 NOTE — ED Notes (Signed)
PT called by this RN and spoke with Abigail Butts. States that she will send out a page. She is aware that pt is up for discharge

## 2013-10-20 NOTE — ED Notes (Signed)
MD at beside

## 2013-10-20 NOTE — ED Notes (Addendum)
Per EMS pt c/o L hip pain that started Friday and gradually worsened. Pt can not bare weight on L leg. Pt denies falling or any injury to hip. Pulses equal strong, and caprefill <3 sec. Pt has hx of Afib, currently taking coumadin.

## 2013-10-20 NOTE — ED Notes (Addendum)
Attempted to ambulate, pt able to put weight on L leg.Pt took a couple of steps that increased pain, pt sts he needed to sit back down.

## 2013-10-20 NOTE — Progress Notes (Signed)
  CARE MANAGEMENT ED NOTE 10/20/2013  Patient:  [name], [name]   Account Number:  [id number]  Date Initiated:  10/20/2013  Documentation initiated by:  Jackelyn Poling  Subjective/Objective Assessment:   80 yr old medicare/aarp [place] pt c/o L hip pain that started Friday and gradually worsened. Pt can not bare weight on L leg. Left trochanter bursa injection     Subjective/Objective Assessment Detail:   Pt's choice is to have [facility] provide services for home health RN, PT He states he prefers" the same ones from last year"  Confirms he has DME at home RW, cane lift chair & bedside commode from March 2014  Reports his wife [name] will be his primary caregiver with Dr [name] as pcp     Action/Plan:   ED CM received a call from [facility] ED staff Olivia Mackie stating CM consult for pt per Dr Reather Converse at 1210 Cm spoke with pt at 1238 CM updated ED RN [place] spoke with Colletta [place] of [facility] at 1244 and Dr Reather Converse at 1248 Pt ready for d/c   Action/Plan Detail:   Anticipated DC Date:  10/20/2013     Status Recommendation to Physician:   Result of Recommendation:    Other ED [place]  Other  Outpatient Services - Pt will follow up  CM consult   [facility] Choice  HOME HEALTH   Choice offered to / List presented to:  C-1 Patient     [place] arranged  HH-1 RN  [place].    Status of service:  Completed, signed off  ED Comments:   ED Comments Detail:

## 2013-10-20 NOTE — Evaluation (Signed)
Physical Therapy Evaluation Patient Details Name: RAWLIN REAUME MRN: [id number] DOB: 03-06-1933 Today's Date: 10/20/2013   History of Present Illness  pt presents with L hip pain.    Clinical Impression  Pt ed on safe use of RW and continued mobility.  Lengthy discussion about D/C planning and f/u with pt's primary Orthopedic MD, and f/u on Ortho recommendations.  No further acute PT needs at this time.  Will sign off.      Follow Up Recommendations Home health PT;Supervision - Intermittent    Equipment Recommendations  None recommended by PT    Recommendations for Other Services       Precautions / Restrictions Precautions Precautions: Fall Restrictions Weight Bearing Restrictions: No      Mobility  Bed Mobility Overal bed mobility: Needs Assistance Bed Mobility: Supine to Sit     Supine to sit: Min assist     General bed mobility comments: pt needed A to bring trunk up to sitting.    Transfers Overall transfer level: Needs assistance Equipment used: Rolling walker (2 wheeled) Transfers: Sit to/from Stand Sit to Stand: Supervision            Ambulation/Gait Ambulation/Gait assistance: Supervision Ambulation Distance (Feet): 200 Feet Assistive device: Rolling walker (2 wheeled) Gait Pattern/deviations: Step-through pattern;Decreased stride length;Trunk flexed     General Gait Details: pt moves slowly and relies on RW for support 2/2 hip pain.  pt needs cueing for upright posture.    Stairs            Wheelchair Mobility    Modified Rankin (Stroke Patients Only)       Balance Overall balance assessment: Needs assistance         Standing balance support: Single extremity supported;Bilateral upper extremity supported;During functional activity Standing balance-Leahy Scale: Poor Standing balance comment: pt needs UE support 2/2 increased pain without UEs.                               Pertinent Vitals/Pain Pain Assessment:  0-10 Pain Score: 5  Pain Location: L hip Pain Descriptors / Indicators: Aching Pain Intervention(s): Monitored during session;Premedicated before session    Home Living Family/patient expects to be discharged to:: Private residence Living Arrangements: Spouse/significant other Available Help at Discharge: Family;Available 24 hours/day Type of Home: House Home Access: Stairs to enter Entrance Stairs-Rails: Left Entrance Stairs-Number of Steps: 3 Home Layout: One level Home Equipment: Walker - 2 wheels;Cane - single point;Bedside commode      Prior Function Level of Independence: Independent with assistive device(s)               Hand Dominance        Extremity/Trunk Assessment   Upper Extremity Assessment: Overall WFL for tasks assessed           Lower Extremity Assessment: Overall WFL for tasks assessed      Cervical / Trunk Assessment: Kyphotic  Communication   Communication: HOH  Cognition Arousal/Alertness: Awake/alert Behavior During Therapy: WFL for tasks assessed/performed Overall Cognitive Status: Within Functional Limits for tasks assessed                      General Comments      Exercises        Assessment/Plan    PT Assessment All further PT needs can be met in the next venue of care  PT Diagnosis Difficulty walking   PT Problem  List Decreased activity tolerance;Decreased balance;Decreased mobility;Decreased knowledge of use of DME;Pain  PT Treatment Interventions     PT Goals (Current goals can be found in the Care Plan section) Acute Rehab PT Goals Patient Stated Goal: Back to Cardiac Rehab PT Goal Formulation: No goals set, d/c therapy    Frequency     Barriers to discharge        Co-evaluation               End of Session Equipment Utilized During Treatment: Gait belt Activity Tolerance: Patient tolerated treatment well Patient left: in bed;with call bell/phone within reach;with family/visitor  present Nurse Communication: Mobility status    Functional Assessment Tool Used: Clinical Judgement Functional Limitation: Mobility: Walking and moving around Mobility: Walking and Moving Around Current Status ([id number]): At least 1 percent but less than 20 percent impaired, limited or restricted Mobility: Walking and Moving Around Goal Status [phone]): At least 1 percent but less than 20 percent impaired, limited or restricted Mobility: Walking and Moving Around Discharge Status [phone]): At least 1 percent but less than 20 percent impaired, limited or restricted    Time: 1438-1509 PT Time Calculation (min): 31 min   Charges:   PT Evaluation $Initial PT Evaluation Tier I: 1 Procedure PT Treatments $Gait Training: 8-22 mins   PT G Codes:   Functional Assessment Tool Used: Clinical Judgement Functional Limitation: Mobility: Walking and moving around    Catarina Hartshorn, [place] [phone] 10/20/2013, 3:22 PM

## 2013-10-20 NOTE — ED Notes (Signed)
Pt is at bedside to try to ambulate patient

## 2013-10-21 ENCOUNTER — Encounter (HOSPITAL_COMMUNITY): Payer: Self-pay

## 2013-10-21 ENCOUNTER — Ambulatory Visit (INDEPENDENT_AMBULATORY_CARE_PROVIDER_SITE_OTHER): Payer: Medicare Other | Admitting: Cardiovascular Disease

## 2013-10-21 DIAGNOSIS — M76899 Other specified enthesopathies of unspecified lower limb, excluding foot: Secondary | ICD-10-CM | POA: Diagnosis not present

## 2013-10-21 DIAGNOSIS — Z86711 Personal history of pulmonary embolism: Secondary | ICD-10-CM | POA: Diagnosis not present

## 2013-10-21 DIAGNOSIS — M545 Low back pain, unspecified: Secondary | ICD-10-CM | POA: Diagnosis not present

## 2013-10-21 DIAGNOSIS — Z7901 Long term (current) use of anticoagulants: Secondary | ICD-10-CM | POA: Diagnosis not present

## 2013-10-21 DIAGNOSIS — M25559 Pain in unspecified hip: Secondary | ICD-10-CM | POA: Diagnosis not present

## 2013-10-21 DIAGNOSIS — I252 Old myocardial infarction: Secondary | ICD-10-CM | POA: Diagnosis not present

## 2013-10-21 DIAGNOSIS — I2589 Other forms of chronic ischemic heart disease: Secondary | ICD-10-CM | POA: Diagnosis not present

## 2013-10-21 DIAGNOSIS — Z8673 Personal history of transient ischemic attack (TIA), and cerebral infarction without residual deficits: Secondary | ICD-10-CM | POA: Diagnosis not present

## 2013-10-21 DIAGNOSIS — Z95810 Presence of automatic (implantable) cardiac defibrillator: Secondary | ICD-10-CM | POA: Diagnosis not present

## 2013-10-21 DIAGNOSIS — Z5181 Encounter for therapeutic drug level monitoring: Secondary | ICD-10-CM

## 2013-10-21 DIAGNOSIS — I4891 Unspecified atrial fibrillation: Secondary | ICD-10-CM

## 2013-10-21 DIAGNOSIS — E559 Vitamin D deficiency, unspecified: Secondary | ICD-10-CM | POA: Diagnosis not present

## 2013-10-21 DIAGNOSIS — I482 Chronic atrial fibrillation, unspecified: Secondary | ICD-10-CM

## 2013-10-21 LAB — POCT INR: INR: 3.6

## 2013-10-22 ENCOUNTER — Encounter (HOSPITAL_COMMUNITY): Payer: Self-pay

## 2013-10-22 DIAGNOSIS — M25559 Pain in unspecified hip: Secondary | ICD-10-CM | POA: Diagnosis not present

## 2013-10-23 ENCOUNTER — Encounter (HOSPITAL_COMMUNITY): Payer: Self-pay

## 2013-10-23 DIAGNOSIS — I2589 Other forms of chronic ischemic heart disease: Secondary | ICD-10-CM | POA: Diagnosis not present

## 2013-10-23 DIAGNOSIS — E559 Vitamin D deficiency, unspecified: Secondary | ICD-10-CM | POA: Diagnosis not present

## 2013-10-23 DIAGNOSIS — M25559 Pain in unspecified hip: Secondary | ICD-10-CM | POA: Diagnosis not present

## 2013-10-23 DIAGNOSIS — M76899 Other specified enthesopathies of unspecified lower limb, excluding foot: Secondary | ICD-10-CM | POA: Diagnosis not present

## 2013-10-23 DIAGNOSIS — M545 Low back pain, unspecified: Secondary | ICD-10-CM | POA: Diagnosis not present

## 2013-10-23 DIAGNOSIS — I4891 Unspecified atrial fibrillation: Secondary | ICD-10-CM | POA: Diagnosis not present

## 2013-10-23 NOTE — ED Provider Notes (Signed)
Medical screening examination/treatment/procedure(s) were conducted as a shared visit with non-physician practitioner(s) or resident and myself. I personally evaluated the patient during the encounter and agree with the findings.  I have personally reviewed any xrays and/ or EKG's with the provider and I agree with interpretation.  80 year old male with history of gait instability, age of fibrillation on Coumadin, significant cardiac history presents with left lateral hip pain worsening for the past 2 days. No specific injury noted however patient has been using a cane and leaning to one side with walking since fractured back. Patient denies new weakness, numbness or bowel or bladder changes, patient has and constipation since radiation from prostate cancer for which is in remission/cancer free at this time. No known cancer in the bones. Patient has pain with standing and range of motion left hip. No cool sensation in the foot. On exam patient has focal tenderness left lateral hip worse greater trochanter/bursa, pain with internal range of motion, normal strength flexion of the hip knee and dorsiflexion of the great toe on the left, normal pulses distally, abdomen soft nontender. Discussed likely muscular skeletal/bursitis his pain. Plan for x-ray, pain meds, ambulation. Discussed possible bursa injection bupivacaine to help with symptoms and followup with orthopedics. Family comfortable the plan.  Patient improved on recheck however still having focal left pain near greater trochanter and superior, discussed this and benefits of local bupivacaine/bursa injection for possible bursitis versus trigger point. Patient agrees with plan.  Procedure  Left trochanter bursa injection  Indication left hip pain.  Risks and benefits discussed.  Betadine used to sterilize the area of most tenderness, bupivacaine injected 5 cc surrounding that area.  Patient tolerated well, mild improvement afterwards.  Done by myself   Patient improved and able to stand however still pain with walking, then moved upon CT, physical therapy evaluation, oral pain meds  .  Left hip pain   Mariea Clonts, MD 10/23/13 [phone]

## 2013-10-25 LAB — POCT INR: INR: 2.5

## 2013-10-27 ENCOUNTER — Ambulatory Visit (INDEPENDENT_AMBULATORY_CARE_PROVIDER_SITE_OTHER): Payer: Medicare Other | Admitting: Internal Medicine

## 2013-10-27 DIAGNOSIS — I482 Chronic atrial fibrillation, unspecified: Secondary | ICD-10-CM

## 2013-10-27 DIAGNOSIS — E559 Vitamin D deficiency, unspecified: Secondary | ICD-10-CM | POA: Diagnosis not present

## 2013-10-27 DIAGNOSIS — M545 Low back pain, unspecified: Secondary | ICD-10-CM | POA: Diagnosis not present

## 2013-10-27 DIAGNOSIS — R82998 Other abnormal findings in urine: Secondary | ICD-10-CM | POA: Diagnosis not present

## 2013-10-27 DIAGNOSIS — I4891 Unspecified atrial fibrillation: Secondary | ICD-10-CM | POA: Diagnosis not present

## 2013-10-27 DIAGNOSIS — M25559 Pain in unspecified hip: Secondary | ICD-10-CM | POA: Diagnosis not present

## 2013-10-27 DIAGNOSIS — Z5181 Encounter for therapeutic drug level monitoring: Secondary | ICD-10-CM | POA: Diagnosis not present

## 2013-10-27 DIAGNOSIS — I2589 Other forms of chronic ischemic heart disease: Secondary | ICD-10-CM | POA: Diagnosis not present

## 2013-10-27 DIAGNOSIS — M76899 Other specified enthesopathies of unspecified lower limb, excluding foot: Secondary | ICD-10-CM | POA: Diagnosis not present

## 2013-10-28 ENCOUNTER — Encounter (HOSPITAL_COMMUNITY): Payer: Self-pay

## 2013-10-29 ENCOUNTER — Encounter (HOSPITAL_COMMUNITY): Payer: Self-pay

## 2013-10-29 ENCOUNTER — Ambulatory Visit (INDEPENDENT_AMBULATORY_CARE_PROVIDER_SITE_OTHER): Payer: Medicare Other | Admitting: Pharmacist

## 2013-10-29 DIAGNOSIS — Z5181 Encounter for therapeutic drug level monitoring: Secondary | ICD-10-CM

## 2013-10-29 DIAGNOSIS — I482 Chronic atrial fibrillation, unspecified: Secondary | ICD-10-CM

## 2013-10-29 DIAGNOSIS — I4891 Unspecified atrial fibrillation: Secondary | ICD-10-CM

## 2013-10-29 LAB — POCT INR: INR: 3.4

## 2013-10-30 ENCOUNTER — Encounter (HOSPITAL_COMMUNITY): Payer: Self-pay

## 2013-11-03 ENCOUNTER — Ambulatory Visit (INDEPENDENT_AMBULATORY_CARE_PROVIDER_SITE_OTHER): Payer: Medicare Other | Admitting: Pharmacist

## 2013-11-03 DIAGNOSIS — I4891 Unspecified atrial fibrillation: Secondary | ICD-10-CM

## 2013-11-03 DIAGNOSIS — Z5181 Encounter for therapeutic drug level monitoring: Secondary | ICD-10-CM

## 2013-11-03 DIAGNOSIS — I482 Chronic atrial fibrillation, unspecified: Secondary | ICD-10-CM

## 2013-11-03 LAB — POCT INR: INR: 3.4

## 2013-11-04 ENCOUNTER — Encounter (HOSPITAL_COMMUNITY): Payer: Self-pay

## 2013-11-04 DIAGNOSIS — I4891 Unspecified atrial fibrillation: Secondary | ICD-10-CM | POA: Diagnosis not present

## 2013-11-04 DIAGNOSIS — M25559 Pain in unspecified hip: Secondary | ICD-10-CM | POA: Diagnosis not present

## 2013-11-04 DIAGNOSIS — M76899 Other specified enthesopathies of unspecified lower limb, excluding foot: Secondary | ICD-10-CM | POA: Diagnosis not present

## 2013-11-04 DIAGNOSIS — I2589 Other forms of chronic ischemic heart disease: Secondary | ICD-10-CM | POA: Diagnosis not present

## 2013-11-04 DIAGNOSIS — M545 Low back pain, unspecified: Secondary | ICD-10-CM | POA: Diagnosis not present

## 2013-11-04 DIAGNOSIS — E559 Vitamin D deficiency, unspecified: Secondary | ICD-10-CM | POA: Diagnosis not present

## 2013-11-05 ENCOUNTER — Encounter (HOSPITAL_COMMUNITY): Payer: Self-pay

## 2013-11-06 ENCOUNTER — Encounter (HOSPITAL_COMMUNITY): Payer: Self-pay

## 2013-11-10 ENCOUNTER — Ambulatory Visit (INDEPENDENT_AMBULATORY_CARE_PROVIDER_SITE_OTHER): Payer: Medicare Other | Admitting: Internal Medicine

## 2013-11-10 DIAGNOSIS — Z5181 Encounter for therapeutic drug level monitoring: Secondary | ICD-10-CM

## 2013-11-10 DIAGNOSIS — I4891 Unspecified atrial fibrillation: Secondary | ICD-10-CM | POA: Diagnosis not present

## 2013-11-10 DIAGNOSIS — Z7901 Long term (current) use of anticoagulants: Secondary | ICD-10-CM | POA: Diagnosis not present

## 2013-11-10 DIAGNOSIS — I482 Chronic atrial fibrillation, unspecified: Secondary | ICD-10-CM

## 2013-11-10 LAB — POCT INR: INR: 2.8

## 2013-11-11 ENCOUNTER — Encounter (HOSPITAL_COMMUNITY): Payer: Self-pay | Attending: Cardiovascular Disease

## 2013-11-11 DIAGNOSIS — I472 Ventricular tachycardia, unspecified: Secondary | ICD-10-CM | POA: Insufficient documentation

## 2013-11-11 DIAGNOSIS — Z79899 Other long term (current) drug therapy: Secondary | ICD-10-CM | POA: Insufficient documentation

## 2013-11-11 DIAGNOSIS — I2510 Atherosclerotic heart disease of native coronary artery without angina pectoris: Secondary | ICD-10-CM | POA: Insufficient documentation

## 2013-11-11 DIAGNOSIS — Z5189 Encounter for other specified aftercare: Secondary | ICD-10-CM | POA: Insufficient documentation

## 2013-11-11 DIAGNOSIS — I5022 Chronic systolic (congestive) heart failure: Secondary | ICD-10-CM | POA: Insufficient documentation

## 2013-11-11 DIAGNOSIS — I4891 Unspecified atrial fibrillation: Secondary | ICD-10-CM | POA: Insufficient documentation

## 2013-11-11 DIAGNOSIS — M545 Low back pain, unspecified: Secondary | ICD-10-CM | POA: Diagnosis not present

## 2013-11-11 DIAGNOSIS — I252 Old myocardial infarction: Secondary | ICD-10-CM | POA: Insufficient documentation

## 2013-11-11 DIAGNOSIS — Z7901 Long term (current) use of anticoagulants: Secondary | ICD-10-CM | POA: Insufficient documentation

## 2013-11-11 DIAGNOSIS — I2589 Other forms of chronic ischemic heart disease: Secondary | ICD-10-CM | POA: Insufficient documentation

## 2013-11-11 DIAGNOSIS — Z954 Presence of other heart-valve replacement: Secondary | ICD-10-CM | POA: Insufficient documentation

## 2013-11-11 DIAGNOSIS — I359 Nonrheumatic aortic valve disorder, unspecified: Secondary | ICD-10-CM | POA: Insufficient documentation

## 2013-11-11 DIAGNOSIS — I4729 Other ventricular tachycardia: Secondary | ICD-10-CM | POA: Insufficient documentation

## 2013-11-11 DIAGNOSIS — I509 Heart failure, unspecified: Secondary | ICD-10-CM | POA: Insufficient documentation

## 2013-11-11 DIAGNOSIS — S32009A Unspecified fracture of unspecified lumbar vertebra, initial encounter for closed fracture: Secondary | ICD-10-CM | POA: Diagnosis not present

## 2013-11-11 DIAGNOSIS — Z7982 Long term (current) use of aspirin: Secondary | ICD-10-CM | POA: Insufficient documentation

## 2013-11-11 DIAGNOSIS — Z8673 Personal history of transient ischemic attack (TIA), and cerebral infarction without residual deficits: Secondary | ICD-10-CM | POA: Insufficient documentation

## 2013-11-11 DIAGNOSIS — R269 Unspecified abnormalities of gait and mobility: Secondary | ICD-10-CM | POA: Insufficient documentation

## 2013-11-11 DIAGNOSIS — Z951 Presence of aortocoronary bypass graft: Secondary | ICD-10-CM | POA: Insufficient documentation

## 2013-11-11 DIAGNOSIS — E785 Hyperlipidemia, unspecified: Secondary | ICD-10-CM | POA: Insufficient documentation

## 2013-11-12 ENCOUNTER — Encounter (HOSPITAL_COMMUNITY): Payer: Self-pay

## 2013-11-12 DIAGNOSIS — M546 Pain in thoracic spine: Secondary | ICD-10-CM | POA: Diagnosis not present

## 2013-11-12 DIAGNOSIS — M545 Low back pain, unspecified: Secondary | ICD-10-CM | POA: Diagnosis not present

## 2013-11-12 DIAGNOSIS — R262 Difficulty in walking, not elsewhere classified: Secondary | ICD-10-CM | POA: Diagnosis not present

## 2013-11-12 DIAGNOSIS — M6281 Muscle weakness (generalized): Secondary | ICD-10-CM | POA: Diagnosis not present

## 2013-11-13 ENCOUNTER — Encounter (HOSPITAL_COMMUNITY): Payer: Self-pay

## 2013-11-13 DIAGNOSIS — E559 Vitamin D deficiency, unspecified: Secondary | ICD-10-CM | POA: Diagnosis not present

## 2013-11-13 DIAGNOSIS — I4891 Unspecified atrial fibrillation: Secondary | ICD-10-CM | POA: Diagnosis not present

## 2013-11-13 DIAGNOSIS — M545 Low back pain, unspecified: Secondary | ICD-10-CM | POA: Diagnosis not present

## 2013-11-13 DIAGNOSIS — M25559 Pain in unspecified hip: Secondary | ICD-10-CM | POA: Diagnosis not present

## 2013-11-13 DIAGNOSIS — I2589 Other forms of chronic ischemic heart disease: Secondary | ICD-10-CM | POA: Diagnosis not present

## 2013-11-13 DIAGNOSIS — M76899 Other specified enthesopathies of unspecified lower limb, excluding foot: Secondary | ICD-10-CM | POA: Diagnosis not present

## 2013-11-18 ENCOUNTER — Encounter (HOSPITAL_COMMUNITY): Payer: Self-pay

## 2013-11-18 DIAGNOSIS — M546 Pain in thoracic spine: Secondary | ICD-10-CM | POA: Diagnosis not present

## 2013-11-18 DIAGNOSIS — M545 Low back pain, unspecified: Secondary | ICD-10-CM | POA: Diagnosis not present

## 2013-11-18 DIAGNOSIS — R262 Difficulty in walking, not elsewhere classified: Secondary | ICD-10-CM | POA: Diagnosis not present

## 2013-11-18 DIAGNOSIS — M6281 Muscle weakness (generalized): Secondary | ICD-10-CM | POA: Diagnosis not present

## 2013-11-19 ENCOUNTER — Encounter (HOSPITAL_COMMUNITY): Payer: Self-pay

## 2013-11-19 ENCOUNTER — Ambulatory Visit (HOSPITAL_COMMUNITY): Payer: Medicare Other | Attending: Cardiology

## 2013-11-19 DIAGNOSIS — I517 Cardiomegaly: Secondary | ICD-10-CM | POA: Diagnosis not present

## 2013-11-19 DIAGNOSIS — K222 Esophageal obstruction: Secondary | ICD-10-CM | POA: Diagnosis not present

## 2013-11-19 DIAGNOSIS — I059 Rheumatic mitral valve disease, unspecified: Secondary | ICD-10-CM | POA: Diagnosis not present

## 2013-11-19 DIAGNOSIS — R05 Cough: Secondary | ICD-10-CM | POA: Insufficient documentation

## 2013-11-19 DIAGNOSIS — I428 Other cardiomyopathies: Secondary | ICD-10-CM | POA: Diagnosis not present

## 2013-11-19 DIAGNOSIS — R059 Cough, unspecified: Secondary | ICD-10-CM | POA: Diagnosis not present

## 2013-11-19 DIAGNOSIS — I2699 Other pulmonary embolism without acute cor pulmonale: Secondary | ICD-10-CM | POA: Diagnosis not present

## 2013-11-19 DIAGNOSIS — I379 Nonrheumatic pulmonary valve disorder, unspecified: Secondary | ICD-10-CM | POA: Insufficient documentation

## 2013-11-19 DIAGNOSIS — M545 Low back pain, unspecified: Secondary | ICD-10-CM | POA: Insufficient documentation

## 2013-11-19 DIAGNOSIS — I959 Hypotension, unspecified: Secondary | ICD-10-CM | POA: Diagnosis not present

## 2013-11-19 DIAGNOSIS — C61 Malignant neoplasm of prostate: Secondary | ICD-10-CM | POA: Diagnosis not present

## 2013-11-19 DIAGNOSIS — I4891 Unspecified atrial fibrillation: Secondary | ICD-10-CM | POA: Diagnosis not present

## 2013-11-19 DIAGNOSIS — I359 Nonrheumatic aortic valve disorder, unspecified: Secondary | ICD-10-CM

## 2013-11-19 DIAGNOSIS — M1990 Unspecified osteoarthritis, unspecified site: Secondary | ICD-10-CM | POA: Diagnosis not present

## 2013-11-19 DIAGNOSIS — K219 Gastro-esophageal reflux disease without esophagitis: Secondary | ICD-10-CM | POA: Diagnosis not present

## 2013-11-19 DIAGNOSIS — E785 Hyperlipidemia, unspecified: Secondary | ICD-10-CM | POA: Insufficient documentation

## 2013-11-19 NOTE — Progress Notes (Signed)
2D Echo completed. 11/19/2013

## 2013-11-20 ENCOUNTER — Encounter (HOSPITAL_COMMUNITY): Payer: Self-pay

## 2013-11-20 DIAGNOSIS — M6281 Muscle weakness (generalized): Secondary | ICD-10-CM | POA: Diagnosis not present

## 2013-11-20 DIAGNOSIS — M545 Low back pain, unspecified: Secondary | ICD-10-CM | POA: Diagnosis not present

## 2013-11-20 DIAGNOSIS — R262 Difficulty in walking, not elsewhere classified: Secondary | ICD-10-CM | POA: Diagnosis not present

## 2013-11-20 DIAGNOSIS — M546 Pain in thoracic spine: Secondary | ICD-10-CM | POA: Diagnosis not present

## 2013-11-24 ENCOUNTER — Ambulatory Visit (INDEPENDENT_AMBULATORY_CARE_PROVIDER_SITE_OTHER): Payer: Medicare Other | Admitting: Pharmacist

## 2013-11-24 ENCOUNTER — Ambulatory Visit (INDEPENDENT_AMBULATORY_CARE_PROVIDER_SITE_OTHER): Payer: Medicare Other | Admitting: *Deleted

## 2013-11-24 ENCOUNTER — Other Ambulatory Visit: Payer: Self-pay | Admitting: Internal Medicine

## 2013-11-24 DIAGNOSIS — Z5181 Encounter for therapeutic drug level monitoring: Secondary | ICD-10-CM

## 2013-11-24 DIAGNOSIS — C61 Malignant neoplasm of prostate: Secondary | ICD-10-CM | POA: Diagnosis not present

## 2013-11-24 DIAGNOSIS — I2589 Other forms of chronic ischemic heart disease: Secondary | ICD-10-CM | POA: Diagnosis not present

## 2013-11-24 DIAGNOSIS — I255 Ischemic cardiomyopathy: Secondary | ICD-10-CM

## 2013-11-24 DIAGNOSIS — I482 Chronic atrial fibrillation, unspecified: Secondary | ICD-10-CM

## 2013-11-24 DIAGNOSIS — I4891 Unspecified atrial fibrillation: Secondary | ICD-10-CM

## 2013-11-24 LAB — POCT INR: INR: 3.3

## 2013-11-24 NOTE — Progress Notes (Signed)
Remote ICD transmission.   

## 2013-11-25 ENCOUNTER — Encounter (HOSPITAL_COMMUNITY): Payer: Self-pay

## 2013-11-26 ENCOUNTER — Encounter (HOSPITAL_COMMUNITY): Payer: Self-pay

## 2013-11-26 ENCOUNTER — Ambulatory Visit (INDEPENDENT_AMBULATORY_CARE_PROVIDER_SITE_OTHER): Payer: Medicare Other | Admitting: Cardiovascular Disease

## 2013-11-26 ENCOUNTER — Encounter: Payer: Self-pay | Admitting: Cardiovascular Disease

## 2013-11-26 VITALS — BP 124/64 | HR 71 | Ht 75.0 in | Wt 193.0 lb

## 2013-11-26 DIAGNOSIS — I255 Ischemic cardiomyopathy: Secondary | ICD-10-CM

## 2013-11-26 DIAGNOSIS — I2589 Other forms of chronic ischemic heart disease: Secondary | ICD-10-CM

## 2013-11-26 DIAGNOSIS — I4891 Unspecified atrial fibrillation: Secondary | ICD-10-CM | POA: Diagnosis not present

## 2013-11-26 DIAGNOSIS — I482 Chronic atrial fibrillation, unspecified: Secondary | ICD-10-CM

## 2013-11-26 MED ORDER — LOSARTAN POTASSIUM 25 MG PO TABS: 25.0000 mg | ORAL_TABLET | Freq: Every day | ORAL | Status: DC

## 2013-11-26 NOTE — Progress Notes (Signed)
HPI:  80 year-old male presenting for follow-up evaluation. He has coronary artery disease and initially was treated with CABG in 1979 and then had redo CABG in 1991. He's had a severe ischemic cardiomyopathy with LVEF 25%. He then underwent transcatheter aortic valve replacement at the [facility] clinic in 2012 for treatment of severe symptomatic aortic stenosis. He has had moderate perivalvular AI. The patient has permanent atrial fibrillation and is maintained on long-term anticoagulation. He's had myalgias related to statin drugs, but is tolerating low-dose atorvastatin at present.   Overall he is doing ok. Has had a long recovery from a back injury in [place] last year, but less pain that in past. Still has some problems with gait instability but no recent falls. He denies chest pain, shortness of breath at rest, orthopnea, PND, or leg swelling. He does admit to DOE and generalized fatigue.  Outpatient Encounter Prescriptions as of 11/26/2013  Medication Sig  . acetaminophen (TYLENOL) 500 MG tablet Take 1,000 mg by mouth every 6 (six) hours as needed.  Marland Kitchen alfuzosin (UROXATRAL) 10 MG 24 hr tablet Take 10 mg by mouth at bedtime.   Marland Kitchen aspirin 81 MG chewable tablet Chew 81 mg by mouth daily.   Marland Kitchen atorvastatin (LIPITOR) 10 MG tablet Take 10 mg by mouth every other day.   . carvedilol (COREG) 6.25 MG tablet Take 6.25 mg by mouth 2 (two) times daily with a meal.  . cholecalciferol (VITAMIN D) 1000 UNITS tablet Take 1,000 Units by mouth daily.  Marland Kitchen COUMADIN 5 MG tablet TAKE AS DIRECTED.  Marland Kitchen levothyroxine (SYNTHROID, LEVOTHROID) 150 MCG tablet Take 150 mcg by mouth daily before breakfast.  . Multiple Vitamins-Minerals (MULTIVITAMIN PO) Take 1 tablet by mouth daily.  . nitroGLYCERIN (NITROSTAT) 0.4 MG SL tablet Place 0.4 mg under the tongue every 5 (five) minutes as needed for chest pain.  Marland Kitchen omeprazole (PRILOSEC) 20 MG capsule Take 20 mg by mouth daily as needed (acid reflux).  . polyethylene glycol  (MIRALAX / GLYCOLAX) packet Take 17 g by mouth daily as needed (constipation).  . RESTASIS 0.05 % ophthalmic emulsion Place 1 drop into both eyes every 12 (twelve) hours.   . [DISCONTINUED] HYDROcodone-acetaminophen (NORCO) 5-325 MG per tablet Take 1-2 tablets by mouth every 4 (four) hours as needed.    Allergies  Allergen Reactions  . Penicillins Shortness Of Breath and Rash  . Ace Inhibitors     Has not tolerated in the past due to hyperkalemia  . Antihistamines, Diphenhydramine-Type Other (See Comments)    Inhibits urination  . Heparin Other (See Comments)    Developed antibodies   . Amiodarone Other (See Comments)    Unknown     Past Medical History  Diagnosis Date  . Ischemic cardiomyopathy 05/2010    Has EF of 25%  . Diverticulitis     CURRENTLY CONTROLLED WITH NO EVIDENCE OF RECURRENT INFECTION  . AF (atrial fibrillation)     Has not tolerated amiodarone in the past. Amiodarone was stopped in September of 2010 due to side effects  . Chronic anticoagulation     on coumadin  . Prostate cancer   . Osteoarthritis     RIGHT KNEE  . Aortic stenosis, severe     WITH PERCUTANEOUS AORTIC VALVE (TAVI) IN March 2012 at [facility]  . MI (myocardial infarction) 1974, 1978  . Cervical myelopathy   . Pulmonary embolism   . Hyperlipidemia   . GERD (gastroesophageal reflux disease)   . Esophageal stricture  WITH DILATATION  . NSVT (nonsustained ventricular tachycardia)   . Coronary artery disease   . Stroke   . Heart murmur     ROS: Positive for low back pain, weakness, unstable gait. Otherwise negative except as per HPI  BP 124/64  Pulse 71  Ht 6\' 3"  (1.905 m)  Wt 193 lb (87.544 kg)  BMI 24.12 kg/m2  PHYSICAL EXAM: Pt is alert and oriented, pleasant elderly male in NAD HEENT: normal Neck: JVP - normal, carotids 2+= without bruits Lungs: CTA bilaterally CV: irregular with grade 2/6 diastolic decrescendo murmur at the LSB Abd: soft, NT, Positive BS, no  hepatomegaly Ext: no C/C/E, distal pulses intact and equal Skin: warm/dry no rash  EKG:  Atrial fibrillation 71 beats per minute, left ventricular hypertrophy, ST and T wave abnormality consider inferolateral ischemia.  ASSESSMENT AND PLAN: 1. Aortic valve disease s/p TAVR with moderate perivalvular AI.  Valve function appears stable. I have personally reviewed his echo images and his paravalvular leak appears moderate at most (I would grade at 2+). Plan continued surveillance with yearly echo studies.  2. CAD, s/p CABG x 2. Stable without anginal sx's. Continue current Rx.   3. Hyperlipidemia. He is now tolerating atorvastatin 10 mg every other day. Lipids have improved significantly. Most recent panel as below: Lipid Panel     Component Value Date/Time   CHOL 142 08/12/2013 0914   TRIG 48.0 08/12/2013 0914   HDL 48.70 08/12/2013 0914   CHOLHDL 3 08/12/2013 0914   VLDL 9.6 08/12/2013 0914   LDLCALC 84 08/12/2013 0914   4. Chronic systolic HF, NYHA Class 2. Intolerant to ACE secondary to cough. In reviewing his echo, I'm concerned that his LV is further dilating. LVEF is stable but LVEDD now 65 mm. Recommend trial of losartan 25 mg. Warned him about potential for hypotension which is a concern in the setting of his frailty. Will follow closely for further med titration. Arrange follow-up with Truitt Merle in 4 weeks.  5. PAF - on warfarin.   Sherren Mocha MD 11/26/2013 3:47 PM

## 2013-11-26 NOTE — Patient Instructions (Signed)
Your physician has recommended you make the following change in your medication:  START Losartan 25mg  take one by mouth daily  Your physician recommends that you schedule a follow-up appointment in: 1 MONTH with Truitt Merle NP

## 2013-11-27 ENCOUNTER — Encounter (HOSPITAL_COMMUNITY): Payer: Self-pay

## 2013-11-27 ENCOUNTER — Other Ambulatory Visit: Payer: Self-pay

## 2013-11-27 DIAGNOSIS — I482 Chronic atrial fibrillation, unspecified: Secondary | ICD-10-CM

## 2013-11-28 LAB — MDC_IDC_ENUM_SESS_TYPE_REMOTE
Battery Remaining Longevity: 133 mo
Brady Statistic RV Percent Paced: 3.93 %
HighPow Impedance: 247 Ohm
HighPow Impedance: 46 Ohm
HighPow Impedance: 62 Ohm
Lead Channel Impedance Value: 589 Ohm
Lead Channel Pacing Threshold Amplitude: 1 V
Lead Channel Pacing Threshold Pulse Width: 0.4 ms
Lead Channel Setting Pacing Amplitude: 2 V
MDC IDC MSMT BATTERY VOLTAGE: 3.05 V
MDC IDC MSMT LEADCHNL RV SENSING INTR AMPL: 18.875 mV
MDC IDC MSMT LEADCHNL RV SENSING INTR AMPL: 18.875 mV
MDC IDC SESS DTM: 20150914160352
MDC IDC SET LEADCHNL RV PACING PULSEWIDTH: 0.4 ms
MDC IDC SET LEADCHNL RV SENSING SENSITIVITY: 0.3 mV
MDC IDC SET ZONE DETECTION INTERVAL: 300 ms
Zone Setting Detection Interval: 400 ms
Zone Setting Detection Interval: 450 ms

## 2013-12-02 ENCOUNTER — Encounter (HOSPITAL_COMMUNITY): Payer: Self-pay

## 2013-12-03 ENCOUNTER — Encounter (HOSPITAL_COMMUNITY): Payer: Self-pay

## 2013-12-03 DIAGNOSIS — R5383 Other fatigue: Secondary | ICD-10-CM | POA: Diagnosis not present

## 2013-12-03 DIAGNOSIS — J988 Other specified respiratory disorders: Secondary | ICD-10-CM | POA: Diagnosis not present

## 2013-12-03 DIAGNOSIS — R5381 Other malaise: Secondary | ICD-10-CM | POA: Diagnosis not present

## 2013-12-03 DIAGNOSIS — R059 Cough, unspecified: Secondary | ICD-10-CM | POA: Diagnosis not present

## 2013-12-03 DIAGNOSIS — IMO0002 Reserved for concepts with insufficient information to code with codable children: Secondary | ICD-10-CM | POA: Diagnosis not present

## 2013-12-03 DIAGNOSIS — R05 Cough: Secondary | ICD-10-CM | POA: Diagnosis not present

## 2013-12-03 DIAGNOSIS — Z7901 Long term (current) use of anticoagulants: Secondary | ICD-10-CM | POA: Diagnosis not present

## 2013-12-03 DIAGNOSIS — I4891 Unspecified atrial fibrillation: Secondary | ICD-10-CM | POA: Diagnosis not present

## 2013-12-04 ENCOUNTER — Encounter (HOSPITAL_COMMUNITY): Payer: Self-pay

## 2013-12-04 ENCOUNTER — Ambulatory Visit (INDEPENDENT_AMBULATORY_CARE_PROVIDER_SITE_OTHER): Payer: Medicare Other | Admitting: Cardiology

## 2013-12-04 DIAGNOSIS — I4891 Unspecified atrial fibrillation: Secondary | ICD-10-CM

## 2013-12-04 DIAGNOSIS — Z5181 Encounter for therapeutic drug level monitoring: Secondary | ICD-10-CM

## 2013-12-04 DIAGNOSIS — I482 Chronic atrial fibrillation, unspecified: Secondary | ICD-10-CM

## 2013-12-04 LAB — POCT INR: INR: 2.8

## 2013-12-05 ENCOUNTER — Ambulatory Visit (INDEPENDENT_AMBULATORY_CARE_PROVIDER_SITE_OTHER): Payer: Medicare Other | Admitting: Cardiovascular Disease

## 2013-12-05 DIAGNOSIS — I482 Chronic atrial fibrillation, unspecified: Secondary | ICD-10-CM

## 2013-12-05 DIAGNOSIS — I4891 Unspecified atrial fibrillation: Secondary | ICD-10-CM

## 2013-12-05 DIAGNOSIS — Z5181 Encounter for therapeutic drug level monitoring: Secondary | ICD-10-CM

## 2013-12-05 LAB — POCT INR: INR: 3.4

## 2013-12-07 ENCOUNTER — Encounter (HOSPITAL_COMMUNITY): Payer: Self-pay | Admitting: Cardiology

## 2013-12-08 ENCOUNTER — Ambulatory Visit (INDEPENDENT_AMBULATORY_CARE_PROVIDER_SITE_OTHER): Payer: Medicare Other | Admitting: Cardiovascular Disease

## 2013-12-08 ENCOUNTER — Encounter (HOSPITAL_COMMUNITY): Payer: Self-pay

## 2013-12-08 DIAGNOSIS — Z5181 Encounter for therapeutic drug level monitoring: Secondary | ICD-10-CM

## 2013-12-08 DIAGNOSIS — I482 Chronic atrial fibrillation, unspecified: Secondary | ICD-10-CM

## 2013-12-08 DIAGNOSIS — I4891 Unspecified atrial fibrillation: Secondary | ICD-10-CM

## 2013-12-08 LAB — POCT INR: INR: 2.4

## 2013-12-09 ENCOUNTER — Encounter (HOSPITAL_COMMUNITY): Payer: Self-pay

## 2013-12-10 ENCOUNTER — Encounter (HOSPITAL_COMMUNITY): Payer: Self-pay

## 2013-12-11 ENCOUNTER — Encounter (HOSPITAL_COMMUNITY): Payer: Self-pay

## 2013-12-12 ENCOUNTER — Ambulatory Visit (INDEPENDENT_AMBULATORY_CARE_PROVIDER_SITE_OTHER): Payer: Medicare Other | Admitting: Cardiology

## 2013-12-12 ENCOUNTER — Encounter (HOSPITAL_COMMUNITY): Payer: Medicare Other | Attending: Cardiovascular Disease

## 2013-12-12 DIAGNOSIS — E785 Hyperlipidemia, unspecified: Secondary | ICD-10-CM | POA: Insufficient documentation

## 2013-12-12 DIAGNOSIS — Z951 Presence of aortocoronary bypass graft: Secondary | ICD-10-CM | POA: Insufficient documentation

## 2013-12-12 DIAGNOSIS — Z7901 Long term (current) use of anticoagulants: Secondary | ICD-10-CM | POA: Insufficient documentation

## 2013-12-12 DIAGNOSIS — I5022 Chronic systolic (congestive) heart failure: Secondary | ICD-10-CM | POA: Insufficient documentation

## 2013-12-12 DIAGNOSIS — Z952 Presence of prosthetic heart valve: Secondary | ICD-10-CM | POA: Insufficient documentation

## 2013-12-12 DIAGNOSIS — I4891 Unspecified atrial fibrillation: Secondary | ICD-10-CM | POA: Insufficient documentation

## 2013-12-12 DIAGNOSIS — I358 Other nonrheumatic aortic valve disorders: Secondary | ICD-10-CM | POA: Insufficient documentation

## 2013-12-12 DIAGNOSIS — I482 Chronic atrial fibrillation, unspecified: Secondary | ICD-10-CM

## 2013-12-12 DIAGNOSIS — Z5181 Encounter for therapeutic drug level monitoring: Secondary | ICD-10-CM

## 2013-12-12 DIAGNOSIS — Z79899 Other long term (current) drug therapy: Secondary | ICD-10-CM | POA: Insufficient documentation

## 2013-12-12 DIAGNOSIS — R269 Unspecified abnormalities of gait and mobility: Secondary | ICD-10-CM | POA: Insufficient documentation

## 2013-12-12 DIAGNOSIS — Z5189 Encounter for other specified aftercare: Secondary | ICD-10-CM | POA: Insufficient documentation

## 2013-12-12 DIAGNOSIS — Z8673 Personal history of transient ischemic attack (TIA), and cerebral infarction without residual deficits: Secondary | ICD-10-CM | POA: Insufficient documentation

## 2013-12-12 DIAGNOSIS — Z7982 Long term (current) use of aspirin: Secondary | ICD-10-CM | POA: Insufficient documentation

## 2013-12-12 DIAGNOSIS — I472 Ventricular tachycardia: Secondary | ICD-10-CM | POA: Insufficient documentation

## 2013-12-12 DIAGNOSIS — I252 Old myocardial infarction: Secondary | ICD-10-CM | POA: Insufficient documentation

## 2013-12-12 DIAGNOSIS — I2510 Atherosclerotic heart disease of native coronary artery without angina pectoris: Secondary | ICD-10-CM | POA: Insufficient documentation

## 2013-12-12 LAB — POCT INR: INR: 2.8

## 2013-12-15 ENCOUNTER — Encounter (HOSPITAL_COMMUNITY): Payer: Self-pay

## 2013-12-15 ENCOUNTER — Encounter: Payer: Self-pay | Admitting: Cardiology

## 2013-12-15 DIAGNOSIS — Z8546 Personal history of malignant neoplasm of prostate: Secondary | ICD-10-CM | POA: Diagnosis not present

## 2013-12-15 DIAGNOSIS — N320 Bladder-neck obstruction: Secondary | ICD-10-CM | POA: Diagnosis not present

## 2013-12-16 ENCOUNTER — Encounter (HOSPITAL_COMMUNITY): Payer: Self-pay

## 2013-12-17 ENCOUNTER — Encounter (HOSPITAL_COMMUNITY): Payer: Self-pay

## 2013-12-18 ENCOUNTER — Encounter (HOSPITAL_COMMUNITY): Payer: Self-pay

## 2013-12-18 ENCOUNTER — Encounter: Payer: Self-pay | Admitting: Internal Medicine

## 2013-12-18 ENCOUNTER — Encounter: Payer: Self-pay | Admitting: Nurse Practitioner

## 2013-12-19 ENCOUNTER — Encounter (HOSPITAL_COMMUNITY): Payer: Self-pay

## 2013-12-22 ENCOUNTER — Encounter (HOSPITAL_COMMUNITY): Payer: Self-pay

## 2013-12-23 ENCOUNTER — Encounter (HOSPITAL_COMMUNITY): Payer: Self-pay

## 2013-12-24 ENCOUNTER — Encounter (HOSPITAL_COMMUNITY): Payer: Self-pay

## 2013-12-25 ENCOUNTER — Other Ambulatory Visit (INDEPENDENT_AMBULATORY_CARE_PROVIDER_SITE_OTHER): Payer: Medicare Other | Admitting: *Deleted

## 2013-12-25 ENCOUNTER — Encounter (HOSPITAL_COMMUNITY): Payer: Self-pay

## 2013-12-25 DIAGNOSIS — I482 Chronic atrial fibrillation, unspecified: Secondary | ICD-10-CM

## 2013-12-25 LAB — BASIC METABOLIC PANEL
BUN: 10 mg/dL (ref 6–23)
CALCIUM: 8.8 mg/dL (ref 8.4–10.5)
CO2: 29 meq/L (ref 19–32)
CREATININE: 1.1 mg/dL (ref 0.4–1.5)
Chloride: 95 mEq/L — ABNORMAL LOW (ref 96–112)
GFR: 71.5 mL/min (ref >60.00)
GLUCOSE: 128 mg/dL — AB (ref 70–99)
Potassium: 4.4 mEq/L (ref 3.5–5.1)
Sodium: 130 mEq/L — ABNORMAL LOW (ref 135–145)

## 2013-12-26 ENCOUNTER — Ambulatory Visit: Payer: Medicare Other | Admitting: Nurse Practitioner

## 2013-12-26 ENCOUNTER — Encounter (HOSPITAL_COMMUNITY): Payer: Self-pay

## 2013-12-26 ENCOUNTER — Telehealth: Payer: Self-pay | Admitting: *Deleted

## 2013-12-26 DIAGNOSIS — E871 Hypo-osmolality and hyponatremia: Secondary | ICD-10-CM

## 2013-12-26 NOTE — Telephone Encounter (Signed)
Patient stated since starting Losartan he has been having problems with decrease energy and erectile dysfunction. Blood pressures at home have been 99-111/56-60's. Will forward to Dr Burt Knack for recommendations.   Labs reviewed by Tera Helper NP and will have patient decrease water intake and recheck next week. Advised patient

## 2013-12-28 NOTE — Telephone Encounter (Signed)
Probably best to stop it. It was started at very low dose, so I don't think a dose decrease will be helpful.

## 2013-12-29 ENCOUNTER — Encounter (HOSPITAL_COMMUNITY): Payer: Self-pay

## 2013-12-30 ENCOUNTER — Encounter: Payer: Self-pay | Admitting: Cardiology

## 2013-12-30 ENCOUNTER — Ambulatory Visit (INDEPENDENT_AMBULATORY_CARE_PROVIDER_SITE_OTHER): Payer: Medicare Other | Admitting: Pharmacist Clinician (PhC)/ Clinical Pharmacy Specialist

## 2013-12-30 ENCOUNTER — Encounter (HOSPITAL_COMMUNITY): Payer: Self-pay

## 2013-12-30 DIAGNOSIS — Z7901 Long term (current) use of anticoagulants: Secondary | ICD-10-CM | POA: Diagnosis not present

## 2013-12-30 DIAGNOSIS — Z5181 Encounter for therapeutic drug level monitoring: Secondary | ICD-10-CM

## 2013-12-30 DIAGNOSIS — I4891 Unspecified atrial fibrillation: Secondary | ICD-10-CM | POA: Diagnosis not present

## 2013-12-30 DIAGNOSIS — I482 Chronic atrial fibrillation, unspecified: Secondary | ICD-10-CM

## 2013-12-30 LAB — POCT INR: INR: 3.2

## 2013-12-30 NOTE — Telephone Encounter (Signed)
I spoke with the pt and made him aware to stop losartan.  The pt will keep lab appointment for this week so that we can follow-up on sodium level.

## 2013-12-31 ENCOUNTER — Encounter (HOSPITAL_COMMUNITY): Payer: Self-pay

## 2014-01-01 ENCOUNTER — Other Ambulatory Visit (INDEPENDENT_AMBULATORY_CARE_PROVIDER_SITE_OTHER): Payer: Medicare Other | Admitting: *Deleted

## 2014-01-01 ENCOUNTER — Encounter (HOSPITAL_COMMUNITY): Payer: Self-pay

## 2014-01-01 DIAGNOSIS — E871 Hypo-osmolality and hyponatremia: Secondary | ICD-10-CM | POA: Diagnosis not present

## 2014-01-01 LAB — BASIC METABOLIC PANEL
BUN: 11 mg/dL (ref 6–23)
CALCIUM: 8.8 mg/dL (ref 8.4–10.5)
CO2: 29 mEq/L (ref 19–32)
CREATININE: 1 mg/dL (ref 0.4–1.5)
Chloride: 99 mEq/L (ref 96–112)
GFR: 74.74 mL/min (ref >60.00)
GLUCOSE: 85 mg/dL (ref 70–99)
Potassium: 4.1 mEq/L (ref 3.5–5.1)
Sodium: 133 mEq/L — ABNORMAL LOW (ref 135–145)

## 2014-01-02 ENCOUNTER — Encounter (HOSPITAL_COMMUNITY): Payer: Self-pay

## 2014-01-05 ENCOUNTER — Encounter (HOSPITAL_COMMUNITY): Payer: Self-pay

## 2014-01-06 ENCOUNTER — Encounter (HOSPITAL_COMMUNITY): Payer: Self-pay

## 2014-01-07 ENCOUNTER — Encounter (HOSPITAL_COMMUNITY): Payer: Self-pay

## 2014-01-07 DIAGNOSIS — K5900 Constipation, unspecified: Secondary | ICD-10-CM | POA: Diagnosis not present

## 2014-01-07 DIAGNOSIS — M545 Low back pain: Secondary | ICD-10-CM | POA: Diagnosis not present

## 2014-01-07 DIAGNOSIS — Z23 Encounter for immunization: Secondary | ICD-10-CM | POA: Diagnosis not present

## 2014-01-07 DIAGNOSIS — R300 Dysuria: Secondary | ICD-10-CM | POA: Diagnosis not present

## 2014-01-08 ENCOUNTER — Encounter (HOSPITAL_COMMUNITY): Payer: Self-pay

## 2014-01-09 ENCOUNTER — Encounter (HOSPITAL_COMMUNITY): Payer: Self-pay

## 2014-01-11 DIAGNOSIS — M545 Low back pain: Secondary | ICD-10-CM | POA: Diagnosis not present

## 2014-01-12 ENCOUNTER — Encounter (HOSPITAL_COMMUNITY): Payer: Self-pay | Attending: Cardiovascular Disease

## 2014-01-12 DIAGNOSIS — I259 Chronic ischemic heart disease, unspecified: Secondary | ICD-10-CM | POA: Insufficient documentation

## 2014-01-12 DIAGNOSIS — I472 Ventricular tachycardia: Secondary | ICD-10-CM | POA: Insufficient documentation

## 2014-01-12 DIAGNOSIS — I358 Other nonrheumatic aortic valve disorders: Secondary | ICD-10-CM | POA: Insufficient documentation

## 2014-01-12 DIAGNOSIS — I252 Old myocardial infarction: Secondary | ICD-10-CM | POA: Insufficient documentation

## 2014-01-12 DIAGNOSIS — I2510 Atherosclerotic heart disease of native coronary artery without angina pectoris: Secondary | ICD-10-CM | POA: Insufficient documentation

## 2014-01-12 DIAGNOSIS — Z5189 Encounter for other specified aftercare: Secondary | ICD-10-CM | POA: Insufficient documentation

## 2014-01-12 DIAGNOSIS — I4891 Unspecified atrial fibrillation: Secondary | ICD-10-CM | POA: Insufficient documentation

## 2014-01-12 DIAGNOSIS — Z8673 Personal history of transient ischemic attack (TIA), and cerebral infarction without residual deficits: Secondary | ICD-10-CM | POA: Insufficient documentation

## 2014-01-12 DIAGNOSIS — Z7982 Long term (current) use of aspirin: Secondary | ICD-10-CM | POA: Insufficient documentation

## 2014-01-12 DIAGNOSIS — Z954 Presence of other heart-valve replacement: Secondary | ICD-10-CM | POA: Insufficient documentation

## 2014-01-12 DIAGNOSIS — Z7901 Long term (current) use of anticoagulants: Secondary | ICD-10-CM | POA: Insufficient documentation

## 2014-01-12 DIAGNOSIS — R269 Unspecified abnormalities of gait and mobility: Secondary | ICD-10-CM | POA: Insufficient documentation

## 2014-01-12 DIAGNOSIS — E785 Hyperlipidemia, unspecified: Secondary | ICD-10-CM | POA: Insufficient documentation

## 2014-01-12 DIAGNOSIS — Z951 Presence of aortocoronary bypass graft: Secondary | ICD-10-CM | POA: Insufficient documentation

## 2014-01-12 DIAGNOSIS — I5022 Chronic systolic (congestive) heart failure: Secondary | ICD-10-CM | POA: Insufficient documentation

## 2014-01-12 DIAGNOSIS — Z79899 Other long term (current) drug therapy: Secondary | ICD-10-CM | POA: Insufficient documentation

## 2014-01-13 ENCOUNTER — Encounter (HOSPITAL_COMMUNITY): Payer: Self-pay

## 2014-01-13 ENCOUNTER — Ambulatory Visit (INDEPENDENT_AMBULATORY_CARE_PROVIDER_SITE_OTHER): Payer: Medicare Other | Admitting: Cardiology

## 2014-01-13 ENCOUNTER — Ambulatory Visit: Payer: Medicare Other | Admitting: Nurse Practitioner

## 2014-01-13 DIAGNOSIS — I482 Chronic atrial fibrillation, unspecified: Secondary | ICD-10-CM

## 2014-01-13 DIAGNOSIS — Z5181 Encounter for therapeutic drug level monitoring: Secondary | ICD-10-CM

## 2014-01-13 LAB — POCT INR: INR: 4.2

## 2014-01-14 ENCOUNTER — Encounter (HOSPITAL_COMMUNITY): Payer: Self-pay

## 2014-01-14 ENCOUNTER — Ambulatory Visit: Payer: Medicare Other | Admitting: Nurse Practitioner

## 2014-01-15 ENCOUNTER — Encounter (HOSPITAL_COMMUNITY): Payer: Self-pay

## 2014-01-16 ENCOUNTER — Inpatient Hospital Stay (HOSPITAL_COMMUNITY)
Admission: EM | Admit: 2014-01-16 | Discharge: 2014-01-20 | DRG: 378 | Disposition: A | Discharge status: Home / Self Care | Payer: Medicare Other | Attending: Internal Medicine | Admitting: Internal Medicine

## 2014-01-16 ENCOUNTER — Encounter (HOSPITAL_COMMUNITY): Payer: Self-pay

## 2014-01-16 ENCOUNTER — Encounter (HOSPITAL_COMMUNITY): Payer: Medicare Other

## 2014-01-16 DIAGNOSIS — Z7982 Long term (current) use of aspirin: Secondary | ICD-10-CM | POA: Diagnosis not present

## 2014-01-16 DIAGNOSIS — I252 Old myocardial infarction: Secondary | ICD-10-CM

## 2014-01-16 DIAGNOSIS — I350 Nonrheumatic aortic (valve) stenosis: Secondary | ICD-10-CM | POA: Diagnosis present

## 2014-01-16 DIAGNOSIS — E785 Hyperlipidemia, unspecified: Secondary | ICD-10-CM | POA: Diagnosis present

## 2014-01-16 DIAGNOSIS — I482 Chronic atrial fibrillation, unspecified: Secondary | ICD-10-CM | POA: Diagnosis present

## 2014-01-16 DIAGNOSIS — Z5181 Encounter for therapeutic drug level monitoring: Secondary | ICD-10-CM

## 2014-01-16 DIAGNOSIS — I5022 Chronic systolic (congestive) heart failure: Secondary | ICD-10-CM

## 2014-01-16 DIAGNOSIS — M8008XA Age-related osteoporosis with current pathological fracture, vertebra(e), initial encounter for fracture: Secondary | ICD-10-CM | POA: Diagnosis present

## 2014-01-16 DIAGNOSIS — I2510 Atherosclerotic heart disease of native coronary artery without angina pectoris: Secondary | ICD-10-CM | POA: Diagnosis not present

## 2014-01-16 DIAGNOSIS — K2970 Gastritis, unspecified, without bleeding: Secondary | ICD-10-CM | POA: Diagnosis not present

## 2014-01-16 DIAGNOSIS — Z8673 Personal history of transient ischemic attack (TIA), and cerebral infarction without residual deficits: Secondary | ICD-10-CM | POA: Diagnosis not present

## 2014-01-16 DIAGNOSIS — C61 Malignant neoplasm of prostate: Secondary | ICD-10-CM | POA: Diagnosis present

## 2014-01-16 DIAGNOSIS — Z95810 Presence of automatic (implantable) cardiac defibrillator: Secondary | ICD-10-CM | POA: Diagnosis present

## 2014-01-16 DIAGNOSIS — K5733 Diverticulitis of large intestine without perforation or abscess with bleeding: Principal | ICD-10-CM | POA: Diagnosis present

## 2014-01-16 DIAGNOSIS — Z951 Presence of aortocoronary bypass graft: Secondary | ICD-10-CM

## 2014-01-16 DIAGNOSIS — K625 Hemorrhage of anus and rectum: Secondary | ICD-10-CM

## 2014-01-16 DIAGNOSIS — K635 Polyp of colon: Secondary | ICD-10-CM | POA: Diagnosis not present

## 2014-01-16 DIAGNOSIS — Z954 Presence of other heart-valve replacement: Secondary | ICD-10-CM

## 2014-01-16 DIAGNOSIS — I2782 Chronic pulmonary embolism: Secondary | ICD-10-CM | POA: Diagnosis present

## 2014-01-16 DIAGNOSIS — Z880 Allergy status to penicillin: Secondary | ICD-10-CM | POA: Diagnosis not present

## 2014-01-16 DIAGNOSIS — Z952 Presence of prosthetic heart valve: Secondary | ICD-10-CM | POA: Diagnosis not present

## 2014-01-16 DIAGNOSIS — M1711 Unilateral primary osteoarthritis, right knee: Secondary | ICD-10-CM | POA: Diagnosis present

## 2014-01-16 DIAGNOSIS — R011 Cardiac murmur, unspecified: Secondary | ICD-10-CM | POA: Diagnosis present

## 2014-01-16 DIAGNOSIS — K5792 Diverticulitis of intestine, part unspecified, without perforation or abscess without bleeding: Secondary | ICD-10-CM | POA: Diagnosis present

## 2014-01-16 DIAGNOSIS — K649 Unspecified hemorrhoids: Secondary | ICD-10-CM | POA: Diagnosis not present

## 2014-01-16 DIAGNOSIS — I472 Ventricular tachycardia: Secondary | ICD-10-CM | POA: Diagnosis present

## 2014-01-16 DIAGNOSIS — Z23 Encounter for immunization: Secondary | ICD-10-CM | POA: Diagnosis not present

## 2014-01-16 DIAGNOSIS — Z888 Allergy status to other drugs, medicaments and biological substances status: Secondary | ICD-10-CM | POA: Diagnosis not present

## 2014-01-16 DIAGNOSIS — I255 Ischemic cardiomyopathy: Secondary | ICD-10-CM | POA: Diagnosis present

## 2014-01-16 DIAGNOSIS — K921 Melena: Secondary | ICD-10-CM

## 2014-01-16 DIAGNOSIS — Z8249 Family history of ischemic heart disease and other diseases of the circulatory system: Secondary | ICD-10-CM

## 2014-01-16 DIAGNOSIS — M8088XA Other osteoporosis with current pathological fracture, vertebra(e), initial encounter for fracture: Secondary | ICD-10-CM | POA: Diagnosis not present

## 2014-01-16 DIAGNOSIS — K922 Gastrointestinal hemorrhage, unspecified: Secondary | ICD-10-CM | POA: Diagnosis present

## 2014-01-16 DIAGNOSIS — R9431 Abnormal electrocardiogram [ECG] [EKG]: Secondary | ICD-10-CM | POA: Diagnosis not present

## 2014-01-16 DIAGNOSIS — Z7901 Long term (current) use of anticoagulants: Secondary | ICD-10-CM | POA: Diagnosis not present

## 2014-01-16 DIAGNOSIS — K5793 Diverticulitis of intestine, part unspecified, without perforation or abscess with bleeding: Secondary | ICD-10-CM | POA: Diagnosis not present

## 2014-01-16 DIAGNOSIS — Z9049 Acquired absence of other specified parts of digestive tract: Secondary | ICD-10-CM | POA: Diagnosis present

## 2014-01-16 DIAGNOSIS — Z87891 Personal history of nicotine dependence: Secondary | ICD-10-CM

## 2014-01-16 DIAGNOSIS — Z79899 Other long term (current) drug therapy: Secondary | ICD-10-CM

## 2014-01-16 DIAGNOSIS — K219 Gastro-esophageal reflux disease without esophagitis: Secondary | ICD-10-CM | POA: Diagnosis present

## 2014-01-16 DIAGNOSIS — I480 Paroxysmal atrial fibrillation: Secondary | ICD-10-CM | POA: Diagnosis not present

## 2014-01-16 DIAGNOSIS — M545 Low back pain: Secondary | ICD-10-CM | POA: Diagnosis not present

## 2014-01-16 LAB — TYPE AND SCREEN
ABO/RH(D): A POS
Antibody Screen: NEGATIVE

## 2014-01-16 LAB — COMPREHENSIVE METABOLIC PANEL
ALT: 12 U/L (ref 0–53)
AST: 18 U/L (ref 0–37)
Albumin: 3.5 g/dL (ref 3.5–5.2)
Alkaline Phosphatase: 94 U/L (ref 39–117)
Anion gap: 13 (ref 5–15)
BUN: 16 mg/dL (ref 6–23)
CALCIUM: 9.1 mg/dL (ref 8.4–10.5)
CO2: 26 mEq/L (ref 19–32)
CREATININE: 0.94 mg/dL (ref 0.50–1.35)
Chloride: 92 mEq/L — ABNORMAL LOW (ref 96–112)
GFR calc non Af Amer: 77 mL/min — ABNORMAL LOW (ref >90)
GFR, EST AFRICAN AMERICAN: 90 mL/min — AB (ref >90)
GLUCOSE: 104 mg/dL — AB (ref 70–99)
Potassium: 4.5 mEq/L (ref 3.7–5.3)
Sodium: 131 mEq/L — ABNORMAL LOW (ref 137–147)
Total Bilirubin: 1 mg/dL (ref 0.3–1.2)
Total Protein: 6.4 g/dL (ref 6.0–8.3)

## 2014-01-16 LAB — CBC WITH DIFFERENTIAL/PLATELET
Basophils Absolute: 0 10*3/uL (ref 0.0–0.1)
Basophils Relative: 0 % (ref 0–1)
Eosinophils Absolute: 0.2 10*3/uL (ref 0.0–0.7)
Eosinophils Relative: 2 % (ref 0–5)
HEMATOCRIT: 41.7 % (ref 39.0–52.0)
HEMOGLOBIN: 14.3 g/dL (ref 13.0–17.0)
LYMPHS PCT: 9 % — AB (ref 12–46)
Lymphs Abs: 0.8 10*3/uL (ref 0.7–4.0)
MCH: 30.4 pg (ref 26.0–34.0)
MCHC: 34.3 g/dL (ref 30.0–36.0)
MCV: 88.5 fL (ref 78.0–100.0)
MONO ABS: 0.7 10*3/uL (ref 0.1–1.0)
MONOS PCT: 8 % (ref 3–12)
NEUTROS ABS: 7.5 10*3/uL (ref 1.7–7.7)
NEUTROS PCT: 81 % — AB (ref 43–77)
Platelets: 219 10*3/uL (ref 150–400)
RBC: 4.71 MIL/uL (ref 4.22–5.81)
RDW: 13.4 % (ref 11.5–15.5)
WBC: 9.3 10*3/uL (ref 4.0–10.5)

## 2014-01-16 LAB — ABO/RH: ABO/RH(D): A POS

## 2014-01-16 LAB — APTT: APTT: 36 s (ref 24–37)

## 2014-01-16 LAB — HEMOGLOBIN AND HEMATOCRIT, BLOOD
HEMATOCRIT: 42.4 % (ref 39.0–52.0)
HEMOGLOBIN: 14.3 g/dL (ref 13.0–17.0)

## 2014-01-16 LAB — PROTIME-INR
INR: 1.69 — ABNORMAL HIGH (ref 0.00–1.49)
PROTHROMBIN TIME: 20.1 s — AB (ref 11.6–15.2)

## 2014-01-16 LAB — MRSA PCR SCREENING: MRSA BY PCR: NEGATIVE

## 2014-01-16 MED ORDER — BISACODYL 10 MG RE SUPP
10.0000 mg | Freq: Every day | RECTAL | Status: DC | PRN
  Filled 2014-01-16: qty 1

## 2014-01-16 MED ORDER — CARVEDILOL 6.25 MG PO TABS
6.2500 mg | ORAL_TABLET | Freq: Two times a day (BID) | ORAL | Status: DC
  Administered 2014-01-16 – 2014-01-20 (×8): 6.25 mg via ORAL
  Filled 2014-01-16 (×10): qty 1

## 2014-01-16 MED ORDER — IPRATROPIUM BROMIDE 0.03 % NA SOLN
2.0000 | Freq: Two times a day (BID) | NASAL | Status: DC
  Filled 2014-01-16: qty 30

## 2014-01-16 MED ORDER — POLYETHYLENE GLYCOL 3350 17 G PO PACK
17.0000 g | PACK | Freq: Every day | ORAL | Status: DC | PRN
  Filled 2014-01-16: qty 1

## 2014-01-16 MED ORDER — FENTANYL CITRATE 0.05 MG/ML IJ SOLN
50.0000 ug | Freq: Once | INTRAMUSCULAR | Status: AC
  Administered 2014-01-16: 50 ug via INTRAVENOUS
  Filled 2014-01-16: qty 2

## 2014-01-16 MED ORDER — CYCLOSPORINE 0.05 % OP EMUL
1.0000 [drp] | Freq: Two times a day (BID) | OPHTHALMIC | Status: DC
  Administered 2014-01-16 – 2014-01-20 (×8): 1 [drp] via OPHTHALMIC
  Filled 2014-01-16 (×11): qty 1

## 2014-01-16 MED ORDER — MORPHINE SULFATE 2 MG/ML IJ SOLN
2.0000 mg | INTRAMUSCULAR | Status: DC | PRN
  Administered 2014-01-17 – 2014-01-18 (×3): 2 mg via INTRAVENOUS
  Filled 2014-01-16 (×3): qty 1

## 2014-01-16 MED ORDER — BUDESONIDE-FORMOTEROL FUMARATE 80-4.5 MCG/ACT IN AERO
2.0000 | INHALATION_SPRAY | Freq: Two times a day (BID) | RESPIRATORY_TRACT | Status: DC
  Administered 2014-01-16 – 2014-01-20 (×8): 2 via RESPIRATORY_TRACT
  Filled 2014-01-16: qty 6.9

## 2014-01-16 MED ORDER — HYDROCODONE-ACETAMINOPHEN 5-325 MG PO TABS
1.0000 | ORAL_TABLET | Freq: Four times a day (QID) | ORAL | Status: DC | PRN
  Administered 2014-01-16 – 2014-01-20 (×5): 1 via ORAL
  Filled 2014-01-16 (×5): qty 1

## 2014-01-16 MED ORDER — METHOCARBAMOL 500 MG PO TABS
500.0000 mg | ORAL_TABLET | Freq: Three times a day (TID) | ORAL | Status: DC | PRN
  Filled 2014-01-16: qty 1

## 2014-01-16 MED ORDER — LEVOTHYROXINE SODIUM 150 MCG PO TABS
150.0000 ug | ORAL_TABLET | Freq: Every day | ORAL | Status: DC
  Administered 2014-01-17 – 2014-01-20 (×4): 150 ug via ORAL
  Filled 2014-01-16 (×5): qty 1

## 2014-01-16 MED ORDER — POLYETHYLENE GLYCOL 3350 17 G PO PACK
17.0000 g | PACK | Freq: Three times a day (TID) | ORAL | Status: DC
  Administered 2014-01-16 – 2014-01-17 (×4): 17 g via ORAL
  Filled 2014-01-16 (×9): qty 1

## 2014-01-16 MED ORDER — CYCLOSPORINE 0.05 % OP EMUL
1.0000 [drp] | Freq: Two times a day (BID) | OPHTHALMIC | Status: DC
  Filled 2014-01-16 (×2): qty 1

## 2014-01-16 MED ORDER — PNEUMOCOCCAL VAC POLYVALENT 25 MCG/0.5ML IJ INJ
0.5000 mL | INJECTION | INTRAMUSCULAR | Status: AC
  Administered 2014-01-17: 0.5 mL via INTRAMUSCULAR
  Filled 2014-01-16: qty 0.5

## 2014-01-16 MED ORDER — ALFUZOSIN HCL ER 10 MG PO TB24
10.0000 mg | ORAL_TABLET | Freq: Every day | ORAL | Status: DC
  Administered 2014-01-16 – 2014-01-19 (×4): 10 mg via ORAL
  Filled 2014-01-16 (×5): qty 1

## 2014-01-16 MED ORDER — INFLUENZA VAC SPLIT QUAD 0.5 ML IM SUSY
0.5000 mL | PREFILLED_SYRINGE | INTRAMUSCULAR | Status: DC
  Filled 2014-01-16: qty 0.5

## 2014-01-16 MED ORDER — IPRATROPIUM BROMIDE 0.06 % NA SOLN
2.0000 | Freq: Two times a day (BID) | NASAL | Status: DC
  Administered 2014-01-16 – 2014-01-19 (×4): 2 via NASAL
  Filled 2014-01-16: qty 15

## 2014-01-16 MED ORDER — ATORVASTATIN CALCIUM 10 MG PO TABS
10.0000 mg | ORAL_TABLET | ORAL | Status: DC
  Administered 2014-01-16 – 2014-01-20 (×3): 10 mg via ORAL
  Filled 2014-01-16 (×3): qty 1

## 2014-01-16 MED ORDER — SODIUM CHLORIDE 0.9 % IJ SOLN
3.0000 mL | Freq: Two times a day (BID) | INTRAMUSCULAR | Status: DC
  Administered 2014-01-16 – 2014-01-19 (×7): 3 mL via INTRAVENOUS

## 2014-01-16 MED ORDER — PANTOPRAZOLE SODIUM 40 MG PO TBEC
40.0000 mg | DELAYED_RELEASE_TABLET | Freq: Every day | ORAL | Status: DC
  Administered 2014-01-16 – 2014-01-20 (×5): 40 mg via ORAL
  Filled 2014-01-16 (×4): qty 1

## 2014-01-16 NOTE — ED Notes (Signed)
Pt suffered L2 fracture on 10/25 and has had low back pain since that time being managed at home.  Pt reports increased pain today and unable to relieve it.  Pt also reports that with increased use recently of pain meds he's had constipation.  Pt report he had a BM Monday and that was the first in 9 days.  Pt reports bright red blood at that time.  Today pt reports black jelly type substance in stool along with bright red blood.

## 2014-01-16 NOTE — Progress Notes (Signed)
[facility] PROGRESS NOTE  [name] LRJ:[id number] DOB: Sep 09, 1932 DOA: 01/16/2014 PCP: [name], MD  Per cardiology discussions will hold coumadin, heparin until endoscopy evaluation   [name]  [facility] Pager [phone]. If 7PM-7AM, please contact night-coverage at [email or web address], password [facility] 01/16/2014, 3:26 PM  LOS: 0 days

## 2014-01-16 NOTE — H&P (Signed)
[facility] History and Physical  [name] MEQ:[id number] DOB: December 08, 1932 DOA: 01/16/2014  Referring physician:  PCP: [name], MD  Specialists:   Chief Complaint: GIB  HPI: [name] is a 80 y.o. male with PMH of CAD s/p CABG, CHF, s/p AVR (TAVR) Ischemic cardiomyopathy s/p ICD, A fib on coumadin, CVA x 3, Recurrent PE, Prostate CA, Recurrent spinal fracture last (12/2013), presented with GIB; He reports that since compression fractures he developed constipation on opioids; after trying bowel regimen he had bloody bowel movement on mondays, then developed 2 episodes of hematochezia, with melena today; He reports prior h/o intermittent hemorrhoidal bleeding but without significant blood loss; no nausea, vomiting  He denies chest pain, no SOB, no dizziness, no palpitations, no new focal neuro symptoms;   Review of Systems: The patient denies anorexia, fever, weight loss,, vision loss, decreased hearing, hoarseness, chest pain, syncope, dyspnea on exertion, peripheral edema, balance deficits, hemoptysis, abdominal pain, melena, hematochezia, severe indigestion/heartburn, hematuria, incontinence, genital sores, muscle weakness, suspicious skin lesions, transient blindness, difficulty walking, depression, unusual weight change, abnormal bleeding, enlarged lymph nodes, angioedema, and breast masses.    Past Medical History  Diagnosis Date  . Ischemic cardiomyopathy 05/2010    Has EF of 25%  . Diverticulitis     CURRENTLY CONTROLLED WITH NO EVIDENCE OF RECURRENT INFECTION  . AF (atrial fibrillation)     Has not tolerated amiodarone in the past. Amiodarone was stopped in September of 2010 due to side effects  . Chronic anticoagulation     on coumadin  . Prostate cancer   . Osteoarthritis     RIGHT KNEE  . Aortic stenosis, severe     WITH PERCUTANEOUS AORTIC VALVE (TAVI) IN March 2012 at the [facility]  . MI (myocardial infarction) 1974, 1978  . Cervical myelopathy   .  Pulmonary embolism   . Hyperlipidemia   . GERD (gastroesophageal reflux disease)   . Esophageal stricture     WITH DILATATION  . NSVT (nonsustained ventricular tachycardia)   . Coronary artery disease   . Stroke   . Heart murmur    Past Surgical History  Procedure Laterality Date  . Icd  Feb 2003; 02/2013    gen change 02-11-2013 by Dr Caryl Comes  . Coronary artery bypass graft  1979  . Coronary artery bypass graft  1991    REDO SURGERY  . Cardiac catheterization  2010    SEVERE LV DYSFUNCTION WITH ESTIMATED EJECTION FRACTION OF 25%  . Cholecystectomy    . Aortic valve replacement      Percutaneous AVR in March 2012 at the [facility]  . Cardioversion  02/16/2011    Procedure: CARDIOVERSION;  Surgeon: Carlena Bjornstad, MD;  Location: [place];  Service: Cardiovascular;  Laterality: N/A;  . Shoulder surgery    . Back surgery     Social History:  reports that he quit smoking about 44 years ago. His smoking use included Cigarettes. He has a 60 pack-year smoking history. He has never used smokeless tobacco. He reports that he does not drink alcohol or use illicit drugs. Home;  where does patient live--home, ALF, SNF? and with whom if at home? Yes;  Can patient participate in ADLs?  Allergies  Allergen Reactions  . Penicillins Shortness Of Breath and Rash  . Ace Inhibitors     Has not tolerated in the past due to hyperkalemia  . Antihistamines, Diphenhydramine-Type Other (See Comments)    Inhibits urination  . Amiodarone Other (  See Comments)    Unknown     Family History  Problem Relation Age of Onset  . Heart disease Mother 35  . Diabetes Mother 23  . Heart disease Father 54    (be sure to complete)  Prior to Admission medications   Medication Sig Start Date End Date Taking? Authorizing Provider  acetaminophen (TYLENOL) 500 MG tablet Take 1,000 mg by mouth every 6 (six) hours as needed.   Yes Historical Provider, MD  alfuzosin (UROXATRAL) 10 MG 24 hr tablet Take 10 mg by  mouth at bedtime.    Yes Historical Provider, MD  aspirin 81 MG chewable tablet Chew 81 mg by mouth daily.    Yes Historical Provider, MD  atorvastatin (LIPITOR) 10 MG tablet Take 10 mg by mouth every other day.    Yes Historical Provider, MD  Budesonide-Formoterol Fumarate (SYMBICORT IN) Inhale 1 puff into the lungs 2 (two) times daily.   Yes Historical Provider, MD  carvedilol (COREG) 6.25 MG tablet Take 6.25 mg by mouth 2 (two) times daily with a meal.   Yes Historical Provider, MD  cholecalciferol (VITAMIN D) 1000 UNITS tablet Take 1,000 Units by mouth daily.   Yes Historical Provider, MD  HYDROcodone-acetaminophen (NORCO/VICODIN) 5-325 MG per tablet Take 1 tablet by mouth every 6 (six) hours as needed for moderate pain.   Yes Historical Provider, MD  ipratropium (ATROVENT) 0.03 % nasal spray Place 2 sprays into both nostrils 2 (two) times daily.   Yes Historical Provider, MD  levothyroxine (SYNTHROID, LEVOTHROID) 150 MCG tablet Take 150 mcg by mouth daily before breakfast.   Yes Historical Provider, MD  methocarbamol (ROBAXIN) 500 MG tablet Take 500 mg by mouth 3 (three) times daily as needed for muscle spasms.   Yes Historical Provider, MD  Multiple Vitamins-Minerals (MULTIVITAMIN PO) Take 1 tablet by mouth daily.   Yes Historical Provider, MD  ondansetron (ZOFRAN) 4 MG tablet Take 4 mg by mouth every 6 (six) hours as needed for nausea or vomiting.   Yes Historical Provider, MD  polyethylene glycol (MIRALAX / GLYCOLAX) packet Take 17 g by mouth daily as needed (constipation).   Yes Historical Provider, MD  predniSONE (DELTASONE) 20 MG tablet Take 20 mg by mouth daily with breakfast.   Yes Historical Provider, MD  RESTASIS 0.05 % ophthalmic emulsion Place 1 drop into both eyes every 12 (twelve) hours.  12/05/11  Yes Historical Provider, MD  traMADol (ULTRAM) 50 MG tablet Take 50 mg by mouth every 8 (eight) hours as needed for moderate pain.   Yes Historical Provider, MD  warfarin (COUMADIN) 5 MG  tablet Take 5 mg by mouth daily.   Yes Historical Provider, MD  COUMADIN 5 MG tablet TAKE AS DIRECTED. Patient not taking: Reported on 01/16/2014 11/24/13   Sherren Mocha, MD  nitroGLYCERIN (NITROSTAT) 0.4 MG SL tablet Place 0.4 mg under the tongue every 5 (five) minutes as needed for chest pain.    Historical Provider, MD  omeprazole (PRILOSEC) 20 MG capsule Take 20 mg by mouth daily as needed (acid reflux).    Historical Provider, MD   Physical Exam: Filed Vitals:   01/16/14 1315  BP: 129/77  Pulse: 90  Temp:   Resp: 20     General:  alert  Eyes: eom-i  ENT: no oral ulcers   Neck: supple   Cardiovascular: s1,s2 syst mr   Respiratory: CTA BL  Abdomen: soft, nt,nd   Skin: ecchymosis   Musculoskeletal: no LE edema  Psychiatric: no hallucinations  Neurologic: CN 2-12 intact, motor 5/5 BL; sensation is intact   Labs on Admission:  Basic Metabolic Panel:  Recent Labs Lab 01/16/14 1146  NA 131*  K 4.5  CL 92*  CO2 26  GLUCOSE 104*  BUN 16  CREATININE 0.94  CALCIUM 9.1   Liver Function Tests:  Recent Labs Lab 01/16/14 1146  AST 18  ALT 12  ALKPHOS 94  BILITOT 1.0  PROT 6.4  ALBUMIN 3.5   No results for input(s): LIPASE, AMYLASE in the last 168 hours. No results for input(s): AMMONIA in the last 168 hours. CBC:  Recent Labs Lab 01/16/14 1146  WBC 9.3  NEUTROABS 7.5  HGB 14.3  HCT 41.7  MCV 88.5  PLT 219   Cardiac Enzymes: No results for input(s): CKTOTAL, CKMB, CKMBINDEX, TROPONINI in the last 168 hours.  BNP (last 3 results) No results for input(s): PROBNP in the last 8760 hours. CBG: No results for input(s): GLUCAP in the last 168 hours.  Radiological Exams on Admission: No results found.  EKG: Independently reviewed.   Assessment/Plan Active Problems:   GI bleed  80 y.o. male with PMH of CAD s/p CABG, CHF, s/p AVR (TAVR) Ischemic cardiomyopathy s/p ICD, A fib, CVA x3, Recurrent PE, Prostate CA, Recurrent spinal fracture last  (12/2013), presented with GIB;  1. GIB, suspected LGIB; hemodynamically stable; Hg is 14.3 -close monitor Hg, type/screen/TF prn; no new bleeding episodes in ED -Pt was on anticoagulation, and stopped coumadin at home; INR 1.6 on admission; due to multiple needs for anticoagulation, with stable Hg without new s/s of bleeding will bridge  with IV heparin for now;   -consulted GI for endoscopy evaluation   2. CAD s/p CABG; denies acute chest pain; cont home regimen; hold ASA 3. CHF;  ischemic cardiomyopathy LVEF 25% s/p ICD; clinically euvolemic; hold diuretics while NPO  4. s/p AVR (TAVR)  Patient's family reports having metallic valve replaced in the past (I could not find the reports in the system, but only TAVR);  5. A fib, with recurrent CVA x3; HR controlled; cont BB; hold coumadin  6. Recurrent spinal fracture last (12/2013); cont pain control; neuro exam no focal;  7. Anticoagulation chronic with coumadin for A fib, recurrent CVA, PE  -Patient had GIB, currently Hg is stable, hemodynamically stable; will bridge with IV heparin, hold coumaidn; consulted cardiology evaluation as well      D/w patient, his wife, children at the bedside; they have requested cardiology evaluation with Dr. Burt Knack   GI, cardiology;  if consultant consulted, please document name and whether formally or informally consulted  Code Status: full (must indicate code status--if unknown or must be presumed, indicate so) Family Communication: d/w patient, his wife, children   (indicate person spoken with, if applicable, with phone number if by telephone) Disposition Plan: home pend clinical improvement  (indicate anticipated LOS)  Time spent: >35 minutes   Kinnie Feil [facility] Pager [phone]  If 7PM-7AM, please contact night-coverage [email or web address] Password [email or web address] 01/16/2014, 1:40 PM

## 2014-01-16 NOTE — Consult Note (Signed)
CONSULTATION NOTE  Reason for Consult: A-fib, AVR, GIB  Requesting Physician: Dr. Amedeo Plenty  Cardiologist: Dr. Burt Knack  HPI: This is a 80 y.o. male with a past medical history significant for coronary artery disease and initially was treated with CABG in 1979 and then had redo CABG in 1991. He's had a severe ischemic cardiomyopathy with LVEF 25%. He then underwent transcatheter aortic valve replacement at the [facility] in 2012 for treatment of severe symptomatic aortic stenosis. He has had moderate perivalvular AI. The patient has permanent atrial fibrillation and is maintained on long-term anticoagulation. He's had myalgias related to statin drugs, but is tolerating low-dose atorvastatin at present.   He now presents with 3-4 days of lower GI bleeding. He denies any chest pain or worsening shortness of breath. Cardiology is asked to consult regarding management of warfarin and his cardiomyopathy in the setting of acute GI bleeding.  PMHx:  Past Medical History  Diagnosis Date  . Ischemic cardiomyopathy 05/2010    Has EF of 25%  . Diverticulitis     CURRENTLY CONTROLLED WITH NO EVIDENCE OF RECURRENT INFECTION  . AF (atrial fibrillation)     Has not tolerated amiodarone in the past. Amiodarone was stopped in September of 2010 due to side effects  . Chronic anticoagulation     on coumadin  . Prostate cancer   . Osteoarthritis     RIGHT KNEE  . Aortic stenosis, severe     WITH PERCUTANEOUS AORTIC VALVE (TAVI) IN March 2012 at the [facility]  . MI (myocardial infarction) 1974, 1978  . Cervical myelopathy   . Pulmonary embolism   . Hyperlipidemia   . GERD (gastroesophageal reflux disease)   . Esophageal stricture     WITH DILATATION  . NSVT (nonsustained ventricular tachycardia)   . Coronary artery disease   . Stroke   . Heart murmur    Past Surgical History  Procedure Laterality Date  . Icd  Feb 2003; 02/2013    gen change 02-11-2013 by Dr Caryl Comes  . Coronary  artery bypass graft  1979  . Coronary artery bypass graft  1991    REDO SURGERY  . Cardiac catheterization  2010    SEVERE LV DYSFUNCTION WITH ESTIMATED EJECTION FRACTION OF 25%  . Cholecystectomy    . Aortic valve replacement      Percutaneous AVR in March 2012 at the [facility]  . Cardioversion  02/16/2011    Procedure: CARDIOVERSION;  Surgeon: Carlena Bjornstad, MD;  Location: [place];  Service: Cardiovascular;  Laterality: N/A;  . Shoulder surgery    . Back surgery      FAMHx: Family History  Problem Relation Age of Onset  . Heart disease Mother 74  . Diabetes Mother 81  . Heart disease Father 31    SOCHx:  reports that he quit smoking about 44 years ago. His smoking use included Cigarettes. He has a 60 pack-year smoking history. He has never used smokeless tobacco. He reports that he does not drink alcohol or use illicit drugs.  ALLERGIES: Allergies  Allergen Reactions  . Penicillins Shortness Of Breath and Rash  . Ace Inhibitors     Has not tolerated in the past due to hyperkalemia  . Antihistamines, Diphenhydramine-Type Other (See Comments)    Inhibits urination  . Amiodarone Other (See Comments)    Unknown     ROS: A comprehensive review of systems was negative except for: Gastrointestinal: positive for melena and BRBPR  HOME MEDICATIONS:  Medication List    ASK your doctor about these medications        acetaminophen 500 MG tablet  Commonly known as:  TYLENOL  Take 1,000 mg by mouth every 6 (six) hours as needed.     alfuzosin 10 MG 24 hr tablet  Commonly known as:  UROXATRAL  Take 10 mg by mouth at bedtime.     aspirin 81 MG chewable tablet  Chew 81 mg by mouth daily.     atorvastatin 10 MG tablet  Commonly known as:  LIPITOR  Take 10 mg by mouth every other day.     carvedilol 6.25 MG tablet  Commonly known as:  COREG  Take 6.25 mg by mouth 2 (two) times daily with a meal.     cholecalciferol 1000 UNITS tablet  Commonly known as:  VITAMIN  D  Take 1,000 Units by mouth daily.     warfarin 5 MG tablet  Commonly known as:  COUMADIN  Take 5 mg by mouth daily.     COUMADIN 5 MG tablet  Generic drug:  warfarin  TAKE AS DIRECTED.     HYDROcodone-acetaminophen 5-325 MG per tablet  Commonly known as:  NORCO/VICODIN  Take 1 tablet by mouth every 6 (six) hours as needed for moderate pain.     ipratropium 0.03 % nasal spray  Commonly known as:  ATROVENT  Place 2 sprays into both nostrils 2 (two) times daily.     levothyroxine 150 MCG tablet  Commonly known as:  SYNTHROID, LEVOTHROID  Take 150 mcg by mouth daily before breakfast.     methocarbamol 500 MG tablet  Commonly known as:  ROBAXIN  Take 500 mg by mouth 3 (three) times daily as needed for muscle spasms.     MULTIVITAMIN PO  Take 1 tablet by mouth daily.     nitroGLYCERIN 0.4 MG SL tablet  Commonly known as:  NITROSTAT  Place 0.4 mg under the tongue every 5 (five) minutes as needed for chest pain.     omeprazole 20 MG capsule  Commonly known as:  PRILOSEC  Take 20 mg by mouth daily as needed (acid reflux).     ondansetron 4 MG tablet  Commonly known as:  ZOFRAN  Take 4 mg by mouth every 6 (six) hours as needed for nausea or vomiting.     polyethylene glycol packet  Commonly known as:  MIRALAX / GLYCOLAX  Take 17 g by mouth daily as needed (constipation).     predniSONE 20 MG tablet  Commonly known as:  DELTASONE  Take 20 mg by mouth daily with breakfast.     RESTASIS 0.05 % ophthalmic emulsion  Generic drug:  cycloSPORINE  Place 1 drop into both eyes every 12 (twelve) hours.     SYMBICORT IN  Inhale 1 puff into the lungs 2 (two) times daily.     traMADol 50 MG tablet  Commonly known as:  ULTRAM  Take 50 mg by mouth every 8 (eight) hours as needed for moderate pain.       HOSPITAL MEDICATIONS: I have reviewed the patient's current medications.  VITALS: Blood pressure 129/77, pulse 90, temperature 97.8 F (36.6 C), temperature source Oral,  resp. rate 20, height _0  (1.88 m), weight 193 lb (87.544 kg), SpO2 93 %.  PHYSICAL EXAM: General appearance: alert, appears stated age and no distress Neck: no carotid bruit and no JVD Lungs: clear to auscultation bilaterally Heart: irregularly irregular rhythm, S1, S2 normal and diastolic murmur: early  diastolic 2/6, crescendo at lower left sternal border Abdomen: soft, non-tender; bowel sounds normal; no masses,  no organomegaly Extremities: extremities normal, atraumatic, no cyanosis or edema Pulses: 2+ and symmetric Skin: pale, warm, dry Neurologic: Grossly normal Psych: Pleasant mood, affect  LABS: Results for orders placed or performed during the hospital encounter of 01/16/14 (from the past 48 hour(s))  CBC with Differential     Status: Abnormal   Collection Time: 01/16/14 11:46 AM  Result Value Ref Range   WBC 9.3 4.0 - 10.5 K/uL   RBC 4.71 4.22 - 5.81 MIL/uL   Hemoglobin 14.3 13.0 - 17.0 g/dL   HCT 41.7 39.0 - 52.0 %   MCV 88.5 78.0 - 100.0 fL   MCH 30.4 26.0 - 34.0 pg   MCHC 34.3 30.0 - 36.0 g/dL   RDW 13.4 11.5 - 15.5 %   Platelets 219 150 - 400 K/uL   Neutrophils Relative % 81 (H) 43 - 77 %   Neutro Abs 7.5 1.7 - 7.7 K/uL   Lymphocytes Relative 9 (L) 12 - 46 %   Lymphs Abs 0.8 0.7 - 4.0 K/uL   Monocytes Relative 8 3 - 12 %   Monocytes Absolute 0.7 0.1 - 1.0 K/uL   Eosinophils Relative 2 0 - 5 %   Eosinophils Absolute 0.2 0.0 - 0.7 K/uL   Basophils Relative 0 0 - 1 %   Basophils Absolute 0.0 0.0 - 0.1 K/uL  Comprehensive metabolic panel     Status: Abnormal   Collection Time: 01/16/14 11:46 AM  Result Value Ref Range   Sodium 131 (L) 137 - 147 mEq/L   Potassium 4.5 3.7 - 5.3 mEq/L   Chloride 92 (L) 96 - 112 mEq/L   CO2 26 19 - 32 mEq/L   Glucose, Bld 104 (H) 70 - 99 mg/dL   BUN 16 6 - 23 mg/dL   Creatinine, Ser 0.94 0.50 - 1.35 mg/dL   Calcium 9.1 8.4 - 10.5 mg/dL   Total Protein 6.4 6.0 - 8.3 g/dL   Albumin 3.5 3.5 - 5.2 g/dL   AST 18 0 - 37 U/L    ALT 12 0 - 53 U/L   Alkaline Phosphatase 94 39 - 117 U/L   Total Bilirubin 1.0 0.3 - 1.2 mg/dL   GFR calc non Af Amer 77 (L) >90 mL/min   GFR calc Af Amer 90 (L) >90 mL/min    Comment: (NOTE) The eGFR has been calculated using the CKD EPI equation. This calculation has not been validated in all clinical situations. eGFR's persistently <90 mL/min signify possible Chronic Kidney Disease.    Anion gap 13 5 - 15  APTT     Status: None   Collection Time: 01/16/14 11:46 AM  Result Value Ref Range   aPTT 36 24 - 37 seconds  Protime-INR     Status: Abnormal   Collection Time: 01/16/14 11:46 AM  Result Value Ref Range   Prothrombin Time 20.1 (H) 11.6 - 15.2 seconds   INR 1.69 (H) 0.00 - 1.49  Type and screen     Status: None   Collection Time: 01/16/14 11:47 AM  Result Value Ref Range   ABO/RH(D) A POS    Antibody Screen NEG    Sample Expiration 01/19/2014   ABO/Rh     Status: None   Collection Time: 01/16/14 11:47 AM  Result Value Ref Range   ABO/RH(D) A POS    *Note: Due to a large number of results and/or encounters for  the requested time period, some results have not been displayed. A complete set of results can be found in Results Review.    IMAGING: No results found.  HOSPITAL DIAGNOSES: Active Problems:   GI bleed   IMPRESSION: 1. Acute lower GI bleeding 2. Permanent atrial fibrillation 3. Ischemic cardiomyopathy, EF 25% 4. [facility] of TAVR in 2012 at [facility] 5. CABG 1979, 1991 6. Intermediate risk for colonoscopy  RECOMMENDATION: Mr. Wirt has new onset lower GI bleeding which is possibly related to diverticulum. He does not have a mechanical aortic valve, but underwent transcatheter aortic valve replacement which is a stented bioprosthetic valve. Based on the fact that it has been in for 3 years, there is low likelihood of valve thrombosis off of warfarin. He is maintained on warfarin, primarily for permanent atrial fibrillation, significant  cardiomyopathy, and history of pulmonary embolus and stroke which are all indications for anticoagulation. It would be reasonable to stop warfarin until a source could be found and potentially treated to reduce his risk of recurrent bleeding. I would not recommend bridging due to ongoing bleeding. I do expect that he would have to go back on warfarin at some point.  Thanks for consulting [place].  Time Spent Directly with Patient: 30 minutes  Pixie Casino, MD, [facility] Attending Cardiologist [facility]  Danaisha Celli C 01/16/2014, 2:12 PM

## 2014-01-16 NOTE — Consult Note (Addendum)
[facility] Consult Note  Referring Provider: No ref. provider found Primary Care Physician:  [name], MD Primary Gastroenterologist:  Dr.  Laurel Dimmer Complaint: rectal bleeding HPI: [name] is an 80 y.o. white  male  With a history of a Edwards mechanical aortic valve who has had various degrees of rectal bleeding over the last 3 or 4 days. He states he had been constipated and had not had a bowel movement in about 10 days until he took some magnesium citrate and they had diarrhea for several days and then started noticing blood in it 3 or 4 days ago. his INR was 4.24 days ago and his Coumadin has been held since then .He gives a variable appearance of bright red blood with normal brown stool as well as some black stools at different times. He denies any abdominal pain or orthostatic dizziness. He had a colonoscopy at proximally 6 years ago by Dr. Timmothy Euler and was told he had diverticulosis and a polyp. He also states he has been treated with antibiotics for diverticulitis in the past.additionally he has a history of prostate cancer and is status post radiation treatment 5 years ago. He denies any visible GI bleeding episodes prior to the current admission.  Past Medical History  Diagnosis Date  . Ischemic cardiomyopathy 05/2010    Has EF of 25%  . Diverticulitis     CURRENTLY CONTROLLED WITH NO EVIDENCE OF RECURRENT INFECTION  . AF (atrial fibrillation)     Has not tolerated amiodarone in the past. Amiodarone was stopped in September of 2010 due to side effects  . Chronic anticoagulation     on coumadin  . Prostate cancer   . Osteoarthritis     RIGHT KNEE  . Aortic stenosis, severe     WITH PERCUTANEOUS AORTIC VALVE (TAVI) IN March 2012 at the [facility]  . MI (myocardial infarction) 1974, 1978  . Cervical myelopathy   . Pulmonary embolism   . Hyperlipidemia   . GERD (gastroesophageal reflux disease)   . Esophageal stricture     WITH DILATATION  . NSVT  (nonsustained ventricular tachycardia)   . Coronary artery disease   . Stroke   . Heart murmur     Past Surgical History  Procedure Laterality Date  . Icd  Feb 2003; 02/2013    gen change 02-11-2013 by Dr Caryl Comes  . Coronary artery bypass graft  1979  . Coronary artery bypass graft  1991    REDO SURGERY  . Cardiac catheterization  2010    SEVERE LV DYSFUNCTION WITH ESTIMATED EJECTION FRACTION OF 25%  . Cholecystectomy    . Aortic valve replacement      Percutaneous AVR in March 2012 at the [facility]  . Cardioversion  02/16/2011    Procedure: CARDIOVERSION;  Surgeon: Carlena Bjornstad, MD;  Location: [facility];  Service: Cardiovascular;  Laterality: N/A;  . Shoulder surgery    . Back surgery       (Not in a hospital admission)  Allergies:  Allergies  Allergen Reactions  . Penicillins Shortness Of Breath and Rash  . Ace Inhibitors     Has not tolerated in the past due to hyperkalemia  . Antihistamines, Diphenhydramine-Type Other (See Comments)    Inhibits urination  . Amiodarone Other (See Comments)    Unknown     Family History  Problem Relation Age of Onset  . Heart disease Mother 95  . Diabetes Mother 55  . Heart disease Father 80  Social History:  reports that he quit smoking about 44 years ago. His smoking use included Cigarettes. He has a 60 pack-year smoking history. He has never used smokeless tobacco. He reports that he does not drink alcohol or use illicit drugs.  Review of Systems: negative except as above   Blood pressure 119/73, pulse 76, temperature 97.8 F (36.6 C), temperature source Oral, resp. rate 15, height '6\' 2"'  (1.88 m), weight 87.544 kg (193 lb), SpO2 97 %. Head: Normocephalic, without obvious abnormality, atraumatic Neck: no adenopathy, no carotid bruit, no JVD, supple, symmetrical, trachea midline and thyroid not enlarged, symmetric, no tenderness/mass/nodules Resp: clear to auscultation bilaterally Cardio: regular rate and rhythm, S1, S2  normal, no murmur, click, rub or gallop GI: soft nondistended with normoactive bowel sounds. A medically mass or guarding Extremities: extremities normal, atraumatic, no cyanosis or edema Rectal exam revealed significant external hemorrhoids per emergency room physician with dark blood in the vault.  Results for orders placed or performed during the hospital encounter of 01/16/14 (from the past 48 hour(s))  CBC with Differential     Status: Abnormal   Collection Time: 01/16/14 11:46 AM  Result Value Ref Range   WBC 9.3 4.0 - 10.5 K/uL   RBC 4.71 4.22 - 5.81 MIL/uL   Hemoglobin 14.3 13.0 - 17.0 g/dL   HCT 41.7 39.0 - 52.0 %   MCV 88.5 78.0 - 100.0 fL   MCH 30.4 26.0 - 34.0 pg   MCHC 34.3 30.0 - 36.0 g/dL   RDW 13.4 11.5 - 15.5 %   Platelets 219 150 - 400 K/uL   Neutrophils Relative % 81 (H) 43 - 77 %   Neutro Abs 7.5 1.7 - 7.7 K/uL   Lymphocytes Relative 9 (L) 12 - 46 %   Lymphs Abs 0.8 0.7 - 4.0 K/uL   Monocytes Relative 8 3 - 12 %   Monocytes Absolute 0.7 0.1 - 1.0 K/uL   Eosinophils Relative 2 0 - 5 %   Eosinophils Absolute 0.2 0.0 - 0.7 K/uL   Basophils Relative 0 0 - 1 %   Basophils Absolute 0.0 0.0 - 0.1 K/uL  Comprehensive metabolic panel     Status: Abnormal   Collection Time: 01/16/14 11:46 AM  Result Value Ref Range   Sodium 131 (L) 137 - 147 mEq/L   Potassium 4.5 3.7 - 5.3 mEq/L   Chloride 92 (L) 96 - 112 mEq/L   CO2 26 19 - 32 mEq/L   Glucose, Bld 104 (H) 70 - 99 mg/dL   BUN 16 6 - 23 mg/dL   Creatinine, Ser 0.94 0.50 - 1.35 mg/dL   Calcium 9.1 8.4 - 10.5 mg/dL   Total Protein 6.4 6.0 - 8.3 g/dL   Albumin 3.5 3.5 - 5.2 g/dL   AST 18 0 - 37 U/L   ALT 12 0 - 53 U/L   Alkaline Phosphatase 94 39 - 117 U/L   Total Bilirubin 1.0 0.3 - 1.2 mg/dL   GFR calc non Af Amer 77 (L) >90 mL/min   GFR calc Af Amer 90 (L) >90 mL/min    Comment: (NOTE) The eGFR has been calculated using the CKD EPI equation. This calculation has not been validated in all clinical  situations. eGFR's persistently <90 mL/min signify possible Chronic Kidney Disease.    Anion gap 13 5 - 15  APTT     Status: None   Collection Time: 01/16/14 11:46 AM  Result Value Ref Range   aPTT 36 24 -  37 seconds  Protime-INR     Status: Abnormal   Collection Time: 01/16/14 11:46 AM  Result Value Ref Range   Prothrombin Time 20.1 (H) 11.6 - 15.2 seconds   INR 1.69 (H) 0.00 - 1.49  Type and screen     Status: None   Collection Time: 01/16/14 11:47 AM  Result Value Ref Range   ABO/RH(D) A POS    Antibody Screen NEG    Sample Expiration 01/19/2014    *Note: Due to a large number of results and/or encounters for the requested time period, some results have not been displayed. A complete set of results can be found in Results Review.   No results found.  Assessment: Rectal bleeding in a patient on Coumadin with mechanical heart valve very likely lower GI bleeding and conceivably even hemorrhoidal given the normal hemoglobin but with presumed diverticular bleeding Plan:  Clear liquid diet and mira lax.  Follow stools and hemoglobin and get cardiology input as to whether and for how long anticoagulation can be held. Certainly he is not hemodynamically unstable at this time. We will need colonoscopy release sigmoidoscopy this admission with timing to be determined based on his course and input from cardiology Reagen Goates,Hoang C 01/16/2014, 1:10 PM

## 2014-01-16 NOTE — ED Provider Notes (Signed)
TIME SEEN: 11:09 AM  CHIEF COMPLAINT: GI bleed  HPI: Pt is a 80 y.o. M with history of ischemic cardiomyopathy, atrial fibrillation on Coumadin, prostate cancer status post radiation, severe aortic stenosis with percutaneous aortic valve replacement 2012, coronary artery disease, prior pulmonary embolus, stroke who recently sustained a spontaneous L2 fracture on 01/04/14 after a episode of coughing who presents emergency department with a GI bleed. He reports that since his compression fracture he has been on narcotic pain medication which is caused him to be constipated. He normally takes Miralax and Metamucil every day. He states he has been using magnesium citrate and had a large bowel movement on Monday, 4 days ago. With that bowel movement he had a large amount of blood. Since that time he has had hematochezia as well as melena. He had 2 episodes of hematochezia with melena today and has had some bleeding without bowel movements. He does have a history of hemorrhoids but also diverticulosis. He denies a prior history of GI bleeding in the past. States his last colonoscopy was 5 years ago with Dr. Redmond School.  Denies abdominal pain. Has had nausea today but no vomiting. States his INR was 4.2 on Monday. He held his doses of Coumadin and have it rechecked yesterday it was 2.1.     He denies any new back pain. No numbness, tingling or focal weakness. No bowel or bladder incontinence.  PCP is Dr. Virgina Jock Cardiologist is Dr. Burt Knack  ROS: See HPI Constitutional: no fever  Eyes: no drainage  ENT: no runny nose   Cardiovascular:  no chest pain  Resp: no SOB  GI: no vomiting GU: no dysuria Integumentary: no rash  Allergy: no hives  Musculoskeletal: no leg swelling  Neurological: no slurred speech ROS otherwise negative  PAST MEDICAL HISTORY/PAST SURGICAL HISTORY:  Past Medical History  Diagnosis Date  . Ischemic cardiomyopathy 05/2010    Has EF of 25%  . Diverticulitis     CURRENTLY CONTROLLED  WITH NO EVIDENCE OF RECURRENT INFECTION  . AF (atrial fibrillation)     Has not tolerated amiodarone in the past. Amiodarone was stopped in September of 2010 due to side effects  . Chronic anticoagulation     on coumadin  . Prostate cancer   . Osteoarthritis     RIGHT KNEE  . Aortic stenosis, severe     WITH PERCUTANEOUS AORTIC VALVE (TAVI) IN March 2012 at the [facility]  . MI (myocardial infarction) 1974, 1978  . Cervical myelopathy   . Pulmonary embolism   . Hyperlipidemia   . GERD (gastroesophageal reflux disease)   . Esophageal stricture     WITH DILATATION  . NSVT (nonsustained ventricular tachycardia)   . Coronary artery disease   . Stroke   . Heart murmur     MEDICATIONS:  Prior to Admission medications   Medication Sig Start Date End Date Taking? Authorizing Provider  acetaminophen (TYLENOL) 500 MG tablet Take 1,000 mg by mouth every 6 (six) hours as needed.    Historical Provider, MD  alfuzosin (UROXATRAL) 10 MG 24 hr tablet Take 10 mg by mouth at bedtime.     Historical Provider, MD  aspirin 81 MG chewable tablet Chew 81 mg by mouth daily.     Historical Provider, MD  atorvastatin (LIPITOR) 10 MG tablet Take 10 mg by mouth every other day.     Historical Provider, MD  carvedilol (COREG) 6.25 MG tablet Take 6.25 mg by mouth 2 (two) times daily with a meal.  Historical Provider, MD  cholecalciferol (VITAMIN D) 1000 UNITS tablet Take 1,000 Units by mouth daily.    Historical Provider, MD  COUMADIN 5 MG tablet TAKE AS DIRECTED. 11/24/13   Sherren Mocha, MD  levothyroxine (SYNTHROID, LEVOTHROID) 150 MCG tablet Take 150 mcg by mouth daily before breakfast.    Historical Provider, MD  Multiple Vitamins-Minerals (MULTIVITAMIN PO) Take 1 tablet by mouth daily.    Historical Provider, MD  nitroGLYCERIN (NITROSTAT) 0.4 MG SL tablet Place 0.4 mg under the tongue every 5 (five) minutes as needed for chest pain.    Historical Provider, MD  omeprazole (PRILOSEC) 20 MG capsule  Take 20 mg by mouth daily as needed (acid reflux).    Historical Provider, MD  polyethylene glycol (MIRALAX / GLYCOLAX) packet Take 17 g by mouth daily as needed (constipation).    Historical Provider, MD  RESTASIS 0.05 % ophthalmic emulsion Place 1 drop into both eyes every 12 (twelve) hours.  12/05/11   Historical Provider, MD    ALLERGIES:  Allergies  Allergen Reactions  . Penicillins Shortness Of Breath and Rash  . Ace Inhibitors     Has not tolerated in the past due to hyperkalemia  . Antihistamines, Diphenhydramine-Type Other (See Comments)    Inhibits urination  . Heparin Other (See Comments)    Developed antibodies   . Amiodarone Other (See Comments)    Unknown     SOCIAL HISTORY:  History  Substance Use Topics  . Smoking status: Former Smoker -- 4.00 packs/day for 15 years    Types: Cigarettes    Quit date: 06/15/1969  . Smokeless tobacco: Never Used  . Alcohol Use: No    FAMILY HISTORY: Family History  Problem Relation Age of Onset  . Heart disease Mother 26  . Diabetes Mother 79  . Heart disease Father 72    EXAM: BP 126/67 mmHg  Pulse 90  Temp(Src) 97.8 F (36.6 C) (Oral)  Resp 16  Ht 6\' 2"  (1.88 m)  Wt 193 lb (87.544 kg)  BMI 24.77 kg/m2  SpO2 96% CONSTITUTIONAL: Alert and oriented and responds appropriately to questions. Well-appearing; well-nourished, elderly, in no distress HEAD: Normocephalic EYES: Conjunctivae clear, PERRL ENT: normal nose; no rhinorrhea; moist mucous membranes; pharynx without lesions noted NECK: Supple, no meningismus, no LAD  CARD: irregularly irregular and mildly tachycardic; S1 and S2 appreciated; + murmur, no clicks, no rubs, no gallops RESP: Normal chest excursion without splinting or tachypnea; breath sounds clear and equal bilaterally; no wheezes, no rhonchi, no rales,  ABD/GI: Normal bowel sounds; non-distended; soft, non-tender, no rebound, no guarding RECTAL:  Patient has 2 extremely large nonthrombosed external  hemorrhoids that are not actively bleeding, he does have maroon-colored gross blood on rectal exam and blood in his underwear BACK:  The back appears normal and is mildly tender to palpation in the upper lumbar region without step-off or deformity, there is no CVA tenderness EXT: Normal ROM in all joints; non-tender to palpation; no edema; normal capillary refill; no cyanosis    SKIN: Normal color for age and race; warm NEURO: Moves all extremities equally; sensation to light touch intact diffusely, no saddle anesthesia PSYCH: The patient's mood and manner are appropriate. Grooming and personal hygiene are appropriate.  MEDICAL DECISION MAKING: Pt here with a GI bleed that may be from diverticuli versus hemorrhoids. He does have a large amount of blood on rectal exam and is mildly tachycardic but otherwise well appearing and hemodynamically stable. Abdominal exam benign. Given his age, comorbidities  and that he is on Coumadin, I feel patient will need admission at least for observation. Will obtain labs, consult gastroenterology.  ED PROGRESS: 12:41 PM  Spoke with Dr. Amedeo Plenty with [facility] gastroenterology who agrees to see the patient in consult. He agrees that this sounds likely like a lower GI bleed. Patient is hemodynamically stable. Hemoglobin is 14. We'll admit to medicine.  12:55 PM  D/w hospitalist for admission to step down, inpatient.     Date: 01/16/2014  11:37 AM  Rate: 80  Rhythm: atrial fibrillation  QRS Axis: normal  Intervals: normal  ST/T Wave abnormalities: normal  Conduction Disutrbances: none  Narrative Interpretation:       [place], DO 01/16/14 1255

## 2014-01-17 DIAGNOSIS — K5733 Diverticulitis of large intestine without perforation or abscess with bleeding: Secondary | ICD-10-CM | POA: Diagnosis not present

## 2014-01-17 LAB — CBC
HEMATOCRIT: 40.2 % (ref 39.0–52.0)
Hemoglobin: 13.8 g/dL (ref 13.0–17.0)
MCH: 30.8 pg (ref 26.0–34.0)
MCHC: 34.3 g/dL (ref 30.0–36.0)
MCV: 89.7 fL (ref 78.0–100.0)
Platelets: 222 10*3/uL (ref 150–400)
RBC: 4.48 MIL/uL (ref 4.22–5.81)
RDW: 13.5 % (ref 11.5–15.5)
WBC: 7 10*3/uL (ref 4.0–10.5)

## 2014-01-17 LAB — HEMOGLOBIN AND HEMATOCRIT, BLOOD
HEMATOCRIT: 41.4 % (ref 39.0–52.0)
Hemoglobin: 14.2 g/dL (ref 13.0–17.0)

## 2014-01-17 NOTE — Progress Notes (Signed)
[facility] PROGRESS NOTE Subjective Patient reports that he is not had any gross bleeding during the night. He has a transcatheter aortic valve replacement which is a stented bowel prosthetic valve cardiology felt that it would be okay to hold his Coumadin. We also reviewed his old records from Dr. Sammuel Cooper. He had EGD and dilatation 2009 and colonoscopy 2009 as well. He had extensive diverticular disease of the left colon, small hyperplastic polyp, and large hemorrhoids. He had negative EGD 12/13 with no gross stricture seen by Dr Penelope Coop. He has been seen in the office by Dr Penelope Coop on several occasions for bright red blood with exam showing bleeding hemorrhoids treated with suppositories.  Objective: Vital signs in last 24 hours: Temp:  [97.5 F (36.4 C)-97.8 F (36.6 C)] 97.5 F (36.4 C) (11/07 0813) Pulse Rate:  [67-130] 89 (11/07 0845) Resp:  [13-21] 18 (11/07 0845) BP: (80-137)/(52-110) 93/52 mmHg (11/07 0845) SpO2:  [90 %-98 %] 91 % (11/07 0847) Weight:  [84.4 kg (186 lb 1.1 oz)-87.544 kg (193 lb)] 84.4 kg (186 lb 1.1 oz) (11/07 0428) Last BM Date: 01/16/14  Intake/Output from previous day: 11/06 0701 - 11/07 0700 In: 240 [P.O.:240] Out: 1175 [Urine:1175] Intake/Output this shift:    PE: General--no acute distress  Abdomen--soft and nontender  Lab Results:  Recent Labs  01/16/14 1146 01/16/14 1835 01/17/14 0039  WBC 9.3  --  7.0  HGB 14.3 14.3 13.8  HCT 41.7 42.4 40.2  PLT 219  --  222   BMET  Recent Labs  01/16/14 1146  NA 131*  K 4.5  CL 92*  CO2 26  CREATININE 0.94   LFT  Recent Labs  01/16/14 1146  PROT 6.4  AST 18  ALT 12  ALKPHOS 94  BILITOT 1.0   PT/INR  Recent Labs  01/16/14 1146  LABPROT 20.1*  INR 1.69*   PANCREAS No results for input(s): LIPASE in the last 72 hours.       Studies/Results: No results found.  Medications: I have reviewed the patient's current medications.  Assessment/Plan: 1. Acute lower  G.I. Bleeding. Probably due to hemorrhoids and/or diverticulosis in the face of over anticoagulation. At this point would follow him clinically and make a decision later about whether he needs a repeat endoscopic procedure.   Elisea Khader JR,Byrne Capek L 01/17/2014, 10:07 AM

## 2014-01-17 NOTE — Progress Notes (Signed)
Subjective:  No complaints of shortness of breath or chest pain  Objective:  Vital Signs in the last 24 hours: BP 93/52 mmHg  Pulse 89  Temp(Src) 97.5 F (36.4 C) (Oral)  Resp 18  Ht 6\' 2"  (1.88 m)  Wt 84.4 kg (186 lb 1.1 oz)  BMI 23.88 kg/m2  SpO2 91%  Physical Exam: Pleasant male in no acute distress Lungs:  Clear  Cardiac: irregular rhythm, normal S1-S2, no S3, 2/6 systolic murmur over aortic valve, 1 to 2/6 diastolic murmur Extremities:  No edema present  Intake/Output from previous day: 11/06 0701 - 11/07 0700 In: 240 [P.O.:240] Out: 1175 [Urine:1175] Weight Filed Weights   01/16/14 1048 01/17/14 0428  Weight: 87.544 kg (193 lb) 84.4 kg (186 lb 1.1 oz)    Lab Results: Basic Metabolic Panel:  Recent Labs  01/16/14 1146  NA 131*  K 4.5  CL 92*  CO2 26  GLUCOSE 104*  BUN 16  CREATININE 0.94    CBC:  Recent Labs  01/16/14 1146 01/16/14 1835 01/17/14 0039  WBC 9.3  --  7.0  NEUTROABS 7.5  --   --   HGB 14.3 14.3 13.8  HCT 41.7 42.4 40.2  MCV 88.5  --  89.7  PLT 219  --  222    BNP    Component Value Date/Time   PROBNP 261.0* 11/15/2011 1140    PROTIME: Lab Results  Component Value Date   INR 1.69* 01/16/2014   INR 4.2 01/13/2014   INR 3.2 12/30/2013    Telemetry: Atrial fibrillation with controlled response  Assessment/Plan: 1. Acute lower GI bleeding of undetermined source 2. Chronic atrial fibrillation 3. Ischemic cardiomyopathy 4. History of TAVR 5. History of pulmonary embolism and stroke  Recommendations:  As per consult note yesterday hold warfarin at this point. He has other indications for warfarin so once bleeding source identified will likely need to restart that.     Kerry Hough  MD [facility]  01/17/2014, 10:24 AM

## 2014-01-17 NOTE — Progress Notes (Signed)
[facility] PROGRESS NOTE  [name] MHD:[id number] DOB: Jul 03, 1932 DOA: 01/16/2014 PCP: [name], MD  Assessment/Plan: 80 y.o. male with PMH of CAD s/p CABG, CHF, s/p AVR (TAVR) Ischemic cardiomyopathy s/p ICD, A fib, CVA, Recurrent PE, Prostate CA, Recurrent spinal fracture last (12/2013), presented with GIB;  1. GIB, suspected LGIB on coumadin; hemodynamically stable; Hg is stable at this time -close monitor Hg, type/screen/TF prn; no new bleeding episodes  -awaiting endoscopy per GI   2. CAD s/p CABG; denies acute chest pain; cont home regimen; hold ASA 3. CHF; ischemic cardiomyopathy LVEF 25% s/p ICD; s/p TAVR;  -clinically euvolemic; hold diuretics while NPO  4. A fib, with recurrent CVA x 3; HR controlled; cont BB; hold coumadin  6. Recurrent spinal fracture last (12/2013); cont pain control; neuro exam no focal;  7. Anticoagulation chronic with coumadin for A fib, recurrent CVA, PE  -Patient had GIB, currently Hg is stable; per cardiology, GI discussions will hold coumadin, heparin until endoscopy evaluation  -will resume anticoagulation post procedure, results      Code Status: full Family Communication: d/w patient, recently with his wife, children  (indicate person spoken with, relationship, and if by phone, the number) Disposition Plan: home pend clinical improvement    Consultants:  GI, cardiology   Procedures:  none  Antibiotics:  none (indicate start date, and stop date if known)  HPI/Subjective: alert  Objective: Filed Vitals:   01/17/14 0845  BP: 93/52  Pulse: 89  Temp:   Resp: 18    Intake/Output Summary (Last 24 hours) at 01/17/14 1004 Last data filed at 01/17/14 0428  Gross per 24 hour  Intake    240 ml  Output   1175 ml  Net   -935 ml   Filed Weights   01/16/14 1048 01/17/14 0428  Weight: 87.544 kg (193 lb) 84.4 kg (186 lb 1.1 oz)    Exam:   General:  alert  Cardiovascular: s1,s2 rrr  Respiratory: CTA  BL  Abdomen: soft, nt,nd   Musculoskeletal: no LE edema   Data Reviewed: Basic Metabolic Panel:  Recent Labs Lab 01/16/14 1146  NA 131*  K 4.5  CL 92*  CO2 26  GLUCOSE 104*  BUN 16  CREATININE 0.94  CALCIUM 9.1   Liver Function Tests:  Recent Labs Lab 01/16/14 1146  AST 18  ALT 12  ALKPHOS 94  BILITOT 1.0  PROT 6.4  ALBUMIN 3.5   No results for input(s): LIPASE, AMYLASE in the last 168 hours. No results for input(s): AMMONIA in the last 168 hours. CBC:  Recent Labs Lab 01/16/14 1146 01/16/14 1835 01/17/14 0039  WBC 9.3  --  7.0  NEUTROABS 7.5  --   --   HGB 14.3 14.3 13.8  HCT 41.7 42.4 40.2  MCV 88.5  --  89.7  PLT 219  --  222   Cardiac Enzymes: No results for input(s): CKTOTAL, CKMB, CKMBINDEX, TROPONINI in the last 168 hours. BNP (last 3 results) No results for input(s): PROBNP in the last 8760 hours. CBG: No results for input(s): GLUCAP in the last 168 hours.  Recent Results (from the past 240 hour(s))  MRSA PCR Screening     Status: None   Collection Time: 01/16/14  5:00 PM  Result Value Ref Range Status   MRSA by PCR NEGATIVE NEGATIVE Final    Comment:        The GeneXpert MRSA Assay (FDA approved for NASAL specimens only), is one component of a  comprehensive MRSA colonization surveillance program. It is not intended to diagnose MRSA infection nor to guide or monitor treatment for MRSA infections.      Studies: No results found.  Scheduled Meds: . alfuzosin  10 mg Oral QHS  . atorvastatin  10 mg Oral QODAY  . budesonide-formoterol  2 puff Inhalation BID  . carvedilol  6.25 mg Oral BID WC  . cycloSPORINE  1 drop Both Eyes Q12H  . Influenza vac split quadrivalent PF  0.5 mL Intramuscular Tomorrow-1000  . ipratropium  2 spray Each Nare BID  . levothyroxine  150 mcg Oral QAC breakfast  . pantoprazole  40 mg Oral Daily  . pneumococcal 23 valent vaccine  0.5 mL Intramuscular Tomorrow-1000  . polyethylene glycol  17 g Oral TID   . sodium chloride  3 mL Intravenous Q12H   Continuous Infusions:   Principal Problem:   GIB (gastrointestinal bleeding) Active Problems:   Ischemic cardiomyopathy   Diverticulitis   S/P aortic valve replacement   Atrial fibrillation, chronic   Anticoagulated on Coumadin   Automatic implantable cardioverter-defibrillator in situ   GI bleed    Time spent: >35 minutes     Kinnie Feil  [facility] Pager [phone]. If 7PM-7AM, please contact night-coverage at [email or web address], password [facility] 01/17/2014, 10:04 AM  LOS: 1 day

## 2014-01-18 DIAGNOSIS — M8088XA Other osteoporosis with current pathological fracture, vertebra(e), initial encounter for fracture: Secondary | ICD-10-CM

## 2014-01-18 LAB — PROTIME-INR
INR: 1.67 — ABNORMAL HIGH (ref 0.00–1.49)
PROTHROMBIN TIME: 19.8 s — AB (ref 11.6–15.2)

## 2014-01-18 LAB — CBC
HCT: 43.1 % (ref 39.0–52.0)
HEMOGLOBIN: 14.5 g/dL (ref 13.0–17.0)
MCH: 29.7 pg (ref 26.0–34.0)
MCHC: 33.6 g/dL (ref 30.0–36.0)
MCV: 88.1 fL (ref 78.0–100.0)
Platelets: 222 10*3/uL (ref 150–400)
RBC: 4.89 MIL/uL (ref 4.22–5.81)
RDW: 13.4 % (ref 11.5–15.5)
WBC: 10 10*3/uL (ref 4.0–10.5)

## 2014-01-18 MED ORDER — WARFARIN - PHYSICIAN DOSING INPATIENT: Freq: Every day | Status: DC

## 2014-01-18 MED ORDER — ACETAMINOPHEN 325 MG PO TABS: 650.0000 mg | ORAL_TABLET | Freq: Four times a day (QID) | ORAL | Status: DC | PRN

## 2014-01-18 MED ORDER — WARFARIN SODIUM 3 MG PO TABS
3.0000 mg | ORAL_TABLET | Freq: Every day | ORAL | Status: DC
  Administered 2014-01-18 – 2014-01-20 (×3): 3 mg via ORAL
  Filled 2014-01-18 (×3): qty 1

## 2014-01-18 MED ORDER — POLYETHYLENE GLYCOL 3350 17 G PO PACK
17.0000 g | PACK | Freq: Every day | ORAL | Status: DC
  Administered 2014-01-19 – 2014-01-20 (×2): 17 g via ORAL
  Filled 2014-01-18 (×2): qty 1

## 2014-01-18 NOTE — Progress Notes (Signed)
NURSING PROGRESS NOTE  [name] [id number] Transfer Data: 01/18/2014 6:12 PM Attending Provider: Delfina Redwood, MD VFM:[id number],JQDU M, MD Code Status: FULL  [name] is a 80 y.o. male patient transferred from 3S  -No acute distress noted.  -No complaints of shortness of breath.  -No complaints of chest pain.   Cardiac Monitoring: Box # 9 in place. Cardiac monitor yields: atrial fibrillation  Blood pressure 116/78, pulse 95, temperature 97.4 F (36.3 C), temperature source Oral, resp. rate 18, height 6\' 2"  (1.88 m), weight 84.4 kg (186 lb 1.1 oz), SpO2 98 %.   IV Fluids:  IV in place, occlusive dsg intact without redness, IV cath antecubital right, condition patent and no redness   Allergies:  Penicillins; Ace inhibitors; Antihistamines, diphenhydramine-type; and Amiodarone  Past Medical History:   has a past medical history of Ischemic cardiomyopathy (05/2010); Diverticulitis; AF (atrial fibrillation); Chronic anticoagulation; Prostate cancer; Osteoarthritis; Aortic stenosis, severe; MI (myocardial infarction) (1974, 1978); Cervical myelopathy; Pulmonary embolism; Hyperlipidemia; GERD (gastroesophageal reflux disease); Esophageal stricture; NSVT (nonsustained ventricular tachycardia); Coronary artery disease; Stroke; and Heart murmur.  Past Surgical History:   has past surgical history that includes ICD (Feb 2003; 02/2013); Coronary artery bypass graft (1979); Coronary artery bypass graft (1991); Cardiac catheterization (2010); Cholecystectomy; Aortic valve replacement; Cardioversion (02/16/2011); Shoulder surgery; and Back surgery.  Skin: intact  Patient/Family orientated to room. Information packet given to patient/family. Admission inpatient armband information verified with patient/family to include name and date of birth and placed on patient arm. Side rails up x 2, fall assessment and education completed with patient/family. Patient/family able to verbalize  understanding of risk associated with falls and verbalized understanding to call for assistance before getting out of bed. Call light within reach. Patient/family able to voice and demonstrate understanding of unit orientation instructions.    Will continue to evaluate and treat per MD orders.  Wallie Renshaw, RN

## 2014-01-18 NOTE — Plan of Care (Signed)
Problem: Phase I Progression Outcomes Goal: OOB as tolerated unless otherwise ordered Outcome: Completed/Met Date Met:  01/18/14 Goal: Voiding-avoid urinary catheter unless indicated Outcome: Not Applicable Date Met:  46/27/03

## 2014-01-18 NOTE — Plan of Care (Signed)
Problem: Phase I Progression Outcomes Goal: Initial discharge plan identified Outcome: Progressing     

## 2014-01-18 NOTE — Progress Notes (Addendum)
Chart reviewed.  [facility] PROGRESS NOTE  [name] [id number]:[id number] DOB: 30-May-1932 DOA: 01/16/2014 PCP: [name], MD  Assessment/Plan: 80 y.o. male with PMH of CAD s/p CABG, CHF, s/p AVR (TAVR) Ischemic cardiomyopathy s/p ICD, A fib, CVA, Recurrent PE, Prostate CA, Recurrent spinal fracture last (12/2013), presented with GIB;  1. LGIB: likely diverticular v hemorroidal. H/h and vitals stable. Pt denies further bleeding, but RN reports blood tinge. Transfer to tele  2. CAD s/p CABG;   3. CHF; ischemic cardiomyopathy LVEF 25% s/p ICD; s/p TAVR;  -clinically euvolemic  4. A fib, with recurrent CVA x 3; HR controlled; cont BB; coumadin held  6. Recurrent spinal fracture last (12/2013); cont pain control. OOB and PT eval  7. Anticoagulation chronic with coumadin for A fib, recurrent CVA, PE  -Patient had GIB, currently Hg is stable; per cardiology, GI discussions will hold coumadin, heparin until endoscopy evaluation  -will resume anticoagulation post procedure, results      Code Status: full Family Communication:  Disposition Plan: transfer to tele   Consultants:  GI, cardiology   Procedures:  none  Antibiotics:  none (indicate start date, and stop date if known)  HPI/Subjective: Had 4 liquid BM, per him no blood, but RN says blood tinge. C/o back pain when getting up.  Objective: Filed Vitals:   01/18/14 0420  BP: 115/56  Pulse: 95  Temp: 97.5 F (36.4 C)  Resp: 18    Intake/Output Summary (Last 24 hours) at 01/18/14 0826 Last data filed at 01/17/14 2038  Gross per 24 hour  Intake    600 ml  Output    800 ml  Net   -200 ml   Filed Weights   01/16/14 1048 01/17/14 0428  Weight: 87.544 kg (193 lb) 84.4 kg (186 lb 1.1 oz)    Exam:   General:  A, o comfortable  Cardiovascular: s1,s2 rrr  Respiratory: CTA BL  Abdomen: soft, nt,nd   Musculoskeletal: no LE edema   Data Reviewed: Basic Metabolic Panel:  Recent Labs Lab  01/16/14 1146  NA 131*  K 4.5  CL 92*  CO2 26  GLUCOSE 104*  BUN 16  CREATININE 0.94  CALCIUM 9.1   Liver Function Tests:  Recent Labs Lab 01/16/14 1146  AST 18  ALT 12  ALKPHOS 94  BILITOT 1.0  PROT 6.4  ALBUMIN 3.5   No results for input(s): LIPASE, AMYLASE in the last 168 hours. No results for input(s): AMMONIA in the last 168 hours. CBC:  Recent Labs Lab 01/16/14 1146 01/16/14 1835 01/17/14 0039 01/17/14 0920 01/18/14 0340  WBC 9.3  --  7.0  --  10.0  NEUTROABS 7.5  --   --   --   --   HGB 14.3 14.3 13.8 14.2 14.5  HCT 41.7 42.4 40.2 41.4 43.1  MCV 88.5  --  89.7  --  88.1  PLT 219  --  222  --  222   Cardiac Enzymes: No results for input(s): CKTOTAL, CKMB, CKMBINDEX, TROPONINI in the last 168 hours. BNP (last 3 results) No results for input(s): PROBNP in the last 8760 hours. CBG: No results for input(s): GLUCAP in the last 168 hours.  Recent Results (from the past 240 hour(s))  MRSA PCR Screening     Status: None   Collection Time: 01/16/14  5:00 PM  Result Value Ref Range Status   MRSA by PCR NEGATIVE NEGATIVE Final    Comment:  The GeneXpert MRSA Assay (FDA approved for NASAL specimens only), is one component of a comprehensive MRSA colonization surveillance program. It is not intended to diagnose MRSA infection nor to guide or monitor treatment for MRSA infections.      Studies: No results found.  Scheduled Meds: . alfuzosin  10 mg Oral QHS  . atorvastatin  10 mg Oral QODAY  . budesonide-formoterol  2 puff Inhalation BID  . carvedilol  6.25 mg Oral BID WC  . cycloSPORINE  1 drop Both Eyes Q12H  . Influenza vac split quadrivalent PF  0.5 mL Intramuscular Tomorrow-1000  . ipratropium  2 spray Each Nare BID  . levothyroxine  150 mcg Oral QAC breakfast  . pantoprazole  40 mg Oral Daily  . polyethylene glycol  17 g Oral TID  . sodium chloride  3 mL Intravenous Q12H   Continuous Infusions:    Time spent: 25 minutes    [facility] Pager [phone]. If 7PM-7AM, please contact night-coverage at [email or web address], password [facility] 01/18/2014, 8:26 AM  LOS: 2 days

## 2014-01-18 NOTE — Plan of Care (Signed)
Problem: Phase I Progression Outcomes Goal: Pain controlled with appropriate interventions Outcome: Not Applicable Date Met:  76/18/48

## 2014-01-18 NOTE — Progress Notes (Signed)
[facility] PROGRESS NOTE Subjective Stools are brown and small amt of brb on tissue  Objective: Vital signs in last 24 hours: Temp:  [97.5 F (36.4 C)-98.6 F (37 C)] 97.5 F (36.4 C) (11/08 0420) Pulse Rate:  [70-95] 95 (11/08 0420) Resp:  [13-20] 18 (11/08 0420) BP: (94-115)/(50-64) 115/56 mmHg (11/08 0420) SpO2:  [92 %-100 %] 92 % (11/08 0832) Last BM Date: 01/16/14  Intake/Output from previous day: 11/07 0701 - 11/08 0700 In: 840 [P.O.:840] Out: 800 [Urine:800] Intake/Output this shift:    PE: General--watching TV no distress  Abdomen--nontender  Lab Results:  Recent Labs  01/16/14 1146 01/16/14 1835 01/17/14 0039 01/17/14 0920 01/18/14 0340  WBC 9.3  --  7.0  --  10.0  HGB 14.3 14.3 13.8 14.2 14.5  HCT 41.7 42.4 40.2 41.4 43.1  PLT 219  --  222  --  222   BMET  Recent Labs  01/16/14 1146  NA 131*  K 4.5  CL 92*  CO2 26  CREATININE 0.94   LFT  Recent Labs  01/16/14 1146  PROT 6.4  AST 18  ALT 12  ALKPHOS 94  BILITOT 1.0   PT/INR  Recent Labs  01/16/14 1146  LABPROT 20.1*  INR 1.69*   PANCREAS No results for input(s): LIPASE in the last 72 hours.       Studies/Results: No results found.  Medications: I have reviewed the patient's current medications.  Assessment/Plan: 1. LGI Bleed. Appears stable. Bleed probably due to increased INR and appears to have stopped. Would advance diet and continue miralax to obatain soft stools and resume anticoag to keep INR reasonable. Colon or sigmoid if rebleeds.   Cuinn Westerhold JR,Quaneisha Hanisch L 01/18/2014, 10:35 AM

## 2014-01-19 ENCOUNTER — Encounter (HOSPITAL_COMMUNITY): Payer: Self-pay

## 2014-01-19 LAB — BASIC METABOLIC PANEL
Anion gap: 12 (ref 5–15)
BUN: 13 mg/dL (ref 6–23)
CALCIUM: 9.2 mg/dL (ref 8.4–10.5)
CO2: 25 meq/L (ref 19–32)
CREATININE: 1.01 mg/dL (ref 0.50–1.35)
Chloride: 94 mEq/L — ABNORMAL LOW (ref 96–112)
GFR calc Af Amer: 79 mL/min — ABNORMAL LOW (ref >90)
GFR calc non Af Amer: 69 mL/min — ABNORMAL LOW (ref >90)
Glucose, Bld: 82 mg/dL (ref 70–99)
Potassium: 4.5 mEq/L (ref 3.7–5.3)
SODIUM: 131 meq/L — AB (ref 137–147)

## 2014-01-19 LAB — HEMOGLOBIN AND HEMATOCRIT, BLOOD
HEMATOCRIT: 40.1 % (ref 39.0–52.0)
HEMOGLOBIN: 13.6 g/dL (ref 13.0–17.0)

## 2014-01-19 LAB — PROTIME-INR
INR: 1.66 — ABNORMAL HIGH (ref 0.00–1.49)
Prothrombin Time: 19.7 seconds — ABNORMAL HIGH (ref 11.6–15.2)

## 2014-01-19 MED ORDER — WARFARIN - PHYSICIAN DOSING INPATIENT: Freq: Every day | Status: DC

## 2014-01-19 MED ORDER — IPRATROPIUM BROMIDE 0.06 % NA SOLN
2.0000 | Freq: Two times a day (BID) | NASAL | Status: DC | PRN
  Filled 2014-01-19: qty 15

## 2014-01-19 NOTE — Evaluation (Signed)
Physical Therapy Evaluation Patient Details Name: [name] MRN: [id number] DOB: [birth year] Today's Date: 01/19/2014   History of Present Illness  [name] is a 80 y.o. male with PMH of CAD s/p CABG, CHF, s/p AVR (TAVR) Ischemic cardiomyopathy s/p ICD, A fib on coumadin, CVA x 3, Recurrent PE, Prostate CA, Recurrent spinal fracture last (12/2013), presented with GI Bleed.. Pt with rcent compression fracture of L2.  Clinical Impression  Pt admitted with GI bleed and also has recent L2 compression fracture. Pt currently with functional limitations due to the deficits listed below (see PT Problem List). Pt will benefit from skilled PT to increase their independence and safety with mobility to allow discharge to the venue listed below. At this time, do not feel pt will need any PT post acute care.     Follow Up Recommendations No PT follow up    Equipment Recommendations       Recommendations for Other Services       Precautions / Restrictions Precautions Precautions: Fall;Back Required Braces or Orthoses: Spinal Brace Spinal Brace: Lumbar corset;Other (comment) Spinal Brace Comments: Pt brought from home due to recent L2 compression fracture.  Pt wears when up and applies in sitting at EOB. Restrictions Weight Bearing Restrictions: No      Mobility  Bed Mobility Overal bed mobility: Needs Assistance Bed Mobility: Rolling;Sidelying to Sit;Sit to Sidelying Rolling: Supervision Sidelying to sit: Supervision     Sit to sidelying: Supervision General bed mobility comments: cues for proper body mechanics  Transfers Overall transfer level: Needs assistance Equipment used: Rolling walker (2 wheeled) Transfers: Sit to/from Stand Sit to Stand: Supervision         General transfer comment: good use of UE  Ambulation/Gait Ambulation/Gait assistance: Min guard Ambulation Distance (Feet): 140 Feet Assistive device: Rolling walker (2 wheeled) Gait Pattern/deviations:  Step-through pattern     General Gait Details: gait guarded at times due to pain.  Had walked earlier with NT.  Stairs            Wheelchair Mobility    Modified Rankin (Stroke Patients Only)       Balance Overall balance assessment: Needs assistance   Sitting balance-Leahy Scale: Good       Standing balance-Leahy Scale: Fair Standing balance comment: RW                             Pertinent Vitals/Pain Pain Assessment: 0-10 Pain Score: 5  Pain Location: low back Pain Intervention(s): Monitored during session;Limited activity within patient's tolerance;Patient requesting pain meds-RN notified;Repositioned    Home Living Family/patient expects to be discharged to:: Private residence Living Arrangements: Spouse/significant other Available Help at Discharge: Family Type of Home: House Home Access: Stairs to enter Entrance Stairs-Rails: Left Entrance Stairs-Number of Steps: 2 Home Layout: One level Home Equipment: [place] - 2 wheels;Cane - single point Additional Comments: elevated commode seat    Prior Function Level of Independence: Independent with assistive device(s)         Comments: Had been walking without AD, but since recent L2 fx (within last 2 weeks) he has been ambulating with RW     Hand Dominance        Extremity/Trunk Assessment   Upper Extremity Assessment: Overall WFL for tasks assessed           Lower Extremity Assessment: Overall WFL for tasks assessed      Cervical / Trunk Assessment: Kyphotic  Communication   Communication: HOH (has hearing aid)  Cognition Arousal/Alertness: Awake/alert Behavior During Therapy: WFL for tasks assessed/performed Overall Cognitive Status: Within Functional Limits for tasks assessed       Memory: Decreased short-term memory;Decreased recall of precautions              General Comments General comments (skin integrity, edema, etc.): Pt sat in recliner after gait and  tried different positions to get comfortable, but requested to return to bed for increased comfort.    Exercises        Assessment/Plan    PT Assessment Patient needs continued PT services  PT Diagnosis Difficulty walking;Acute pain   PT Problem List Decreased balance;Decreased mobility;Decreased activity tolerance;Pain  PT Treatment Interventions Gait training;Stair training;Functional mobility training;Therapeutic activities;Therapeutic exercise;Balance training   PT Goals (Current goals can be found in the Care Plan section) Acute Rehab PT Goals Patient Stated Goal: go home PT Goal Formulation: With patient Time For Goal Achievement: 01/26/14 Potential to Achieve Goals: Good    Frequency Min 3X/week   Barriers to discharge        Co-evaluation               End of Session Equipment Utilized During Treatment: Gait belt;Back brace Activity Tolerance: Patient tolerated treatment well Patient left: in bed;with call bell/phone within reach Nurse Communication: Patient requests pain meds         Time: 9432-7614 PT Time Calculation (min): 30 min   Charges:   PT Evaluation $Initial PT Evaluation Tier I: 1 Procedure PT Treatments $Gait Training: 8-22 mins $Therapeutic Activity: 8-22 mins   PT G Codes:          Essie Lagunes LUBECK 01/19/2014, 12:02 PM

## 2014-01-19 NOTE — Care Management Note (Signed)
    Page 1 of 1   01/20/2014     12:08:04 PM CARE MANAGEMENT NOTE 01/20/2014  Patient:  [name], [name]   Account Number:  [id number]  Date Initiated:  01/19/2014  Documentation initiated by:  Tomi Bamberger  Subjective/Objective Assessment:   dx gib  admit- lives with spouse.     Action/Plan:   Anticipated DC Date:  01/20/2014   Anticipated DC Plan:  [place]  CM consult      Choice offered to / List presented to:             Status of service:  Completed, signed off Medicare Important Message given?  YES (If response is "NO", the following Medicare IM given date fields will be blank) Date Medicare IM given:  01/19/2014 Medicare IM given by:  Tomi Bamberger Date Additional Medicare IM given:   Additional Medicare IM given by:    Discharge Disposition:  HOME/SELF CARE  Per UR Regulation:  Reviewed for med. necessity/level of care/duration of stay  If discussed at [place] of Stay Meetings, dates discussed:    Comments:  01/19/14 Hannah, BSN [phone] patient lives with spouse, per physical therapy no pt fu needed.

## 2014-01-19 NOTE — Progress Notes (Signed)
[facility] PROGRESS NOTE  [name] TDD:[id number] DOB: 11/10/1932 DOA: 01/16/2014 PCP: [name], MD  Assessment/Plan: 80 y.o. male with PMH of CAD s/p CABG, CHF, s/p AVR (TAVR) Ischemic cardiomyopathy s/p ICD, A fib, CVA, Recurrent PE, Prostate CA, Recurrent spinal fracture last (12/2013), presented with GIB;  1. LGIB: likely diverticular v hemorroidal. H/h and vitals stable. Pt denies further bleeding. Tolerating solids  2. CAD s/p CABG;   3. CHF; ischemic cardiomyopathy LVEF 25% s/p ICD; s/p TAVR;  -clinically euvolemic  4. A fib, with recurrent CVA x 3; HR controlled; cont BB; coumadin resumed at lower dose (supratherapeutic on admission). Was on 5 mg daily except mondays, 2.5. i have decreased to 3 mg daily.  6. Recurrent spinal fracture last (12/2013); cont pain control. Worked with PT, who will work with pt again tomorrow    Code Status: full Family Communication:  Disposition Plan: likely home tomorrow after working with PT   Consultants:  GI, cardiology   Procedures:  none  Antibiotics:  none (indicate start date, and stop date if known)  HPI/Subjective: Tolerating solids. No bleeding.   Objective: Filed Vitals:   01/19/14 1317  BP: 102/65  Pulse: 61  Temp: 97.6 F (36.4 C)  Resp: 16    Intake/Output Summary (Last 24 hours) at 01/19/14 1416 Last data filed at 01/19/14 1321  Gross per 24 hour  Intake    440 ml  Output   1300 ml  Net   -860 ml   Filed Weights   01/16/14 1048 01/17/14 0428  Weight: 87.544 kg (193 lb) 84.4 kg (186 lb 1.1 oz)    Exam:   General:  A, o comfortable  Cardiovascular: s1,s2 rrr  Respiratory: CTA BL  Abdomen: soft, nt,nd   Musculoskeletal: no LE edema   Data Reviewed: Basic Metabolic Panel:  Recent Labs Lab 01/16/14 1146 01/19/14 0345  NA 131* 131*  K 4.5 4.5  CL 92* 94*  CO2 26 25  GLUCOSE 104* 82  BUN 16 13  CREATININE 0.94 1.01  CALCIUM 9.1 9.2   Liver Function  Tests:  Recent Labs Lab 01/16/14 1146  AST 18  ALT 12  ALKPHOS 94  BILITOT 1.0  PROT 6.4  ALBUMIN 3.5   No results for input(s): LIPASE, AMYLASE in the last 168 hours. No results for input(s): AMMONIA in the last 168 hours. CBC:  Recent Labs Lab 01/16/14 1146 01/16/14 1835 01/17/14 0039 01/17/14 0920 01/18/14 0340 01/19/14 0345  WBC 9.3  --  7.0  --  10.0  --   NEUTROABS 7.5  --   --   --   --   --   HGB 14.3 14.3 13.8 14.2 14.5 13.6  HCT 41.7 42.4 40.2 41.4 43.1 40.1  MCV 88.5  --  89.7  --  88.1  --   PLT 219  --  222  --  222  --    Cardiac Enzymes: No results for input(s): CKTOTAL, CKMB, CKMBINDEX, TROPONINI in the last 168 hours. BNP (last 3 results) No results for input(s): PROBNP in the last 8760 hours. CBG: No results for input(s): GLUCAP in the last 168 hours.  Recent Results (from the past 240 hour(s))  MRSA PCR Screening     Status: None   Collection Time: 01/16/14  5:00 PM  Result Value Ref Range Status   MRSA by PCR NEGATIVE NEGATIVE Final    Comment:        The GeneXpert MRSA Assay (FDA approved for  NASAL specimens only), is one component of a comprehensive MRSA colonization surveillance program. It is not intended to diagnose MRSA infection nor to guide or monitor treatment for MRSA infections.      Studies: No results found.  Scheduled Meds: . alfuzosin  10 mg Oral QHS  . atorvastatin  10 mg Oral QODAY  . budesonide-formoterol  2 puff Inhalation BID  . carvedilol  6.25 mg Oral BID WC  . cycloSPORINE  1 drop Both Eyes Q12H  . Influenza vac split quadrivalent PF  0.5 mL Intramuscular Tomorrow-1000  . ipratropium  2 spray Each Nare BID  . levothyroxine  150 mcg Oral QAC breakfast  . pantoprazole  40 mg Oral Daily  . polyethylene glycol  17 g Oral Daily  . sodium chloride  3 mL Intravenous Q12H  . warfarin  3 mg Oral Daily  . Warfarin - Physician Dosing Inpatient   Does not apply q1800   Continuous Infusions:    Time spent: 25  minutes   [place] [facility] Pager [phone]. If 7PM-7AM, please contact night-coverage at [email or web address], password [facility] 01/19/2014, 2:16 PM  LOS: 3 days

## 2014-01-19 NOTE — Progress Notes (Signed)
[facility] PROGRESS NOTE Subjective Patient reports no further bleeding overnight. Tolerating diet  Objective: Vital signs in last 24 hours: Temp:  [97.2 F (36.2 C)-97.6 F (36.4 C)] 97.2 F (36.2 C) (11/09 0615) Pulse Rate:  [71-99] 74 (11/09 0809) Resp:  [17-18] 18 (11/09 0615) BP: (98-116)/(52-80) 113/52 mmHg (11/09 0809) SpO2:  [92 %-98 %] 95 % (11/09 0615) Last BM Date: 01/17/14  Intake/Output from previous day: 11/08 0701 - 11/09 0700 In: 520 [P.O.:520] Out: 700 [Urine:700] Intake/Output this shift: Total I/O In: -  Out: 600 [Urine:600]  PE: General--no acute distress  Abdomen--soft and nontender  Lab Results:  Recent Labs  01/16/14 1146 01/16/14 1835 01/17/14 0039 01/17/14 0920 01/18/14 0340 01/19/14 0345  WBC 9.3  --  7.0  --  10.0  --   HGB 14.3 14.3 13.8 14.2 14.5 13.6  HCT 41.7 42.4 40.2 41.4 43.1 40.1  PLT 219  --  222  --  222  --    BMET  Recent Labs  01/16/14 1146 01/19/14 0345  NA 131* 131*  K 4.5 4.5  CL 92* 94*  CO2 26 25  CREATININE 0.94 1.01   LFT  Recent Labs  01/16/14 1146  PROT 6.4  AST 18  ALT 12  ALKPHOS 94  BILITOT 1.0   PT/INR  Recent Labs  01/16/14 1146 01/18/14 1153 01/19/14 0345  LABPROT 20.1* 19.8* 19.7*  INR 1.69* 1.67* 1.66*   PANCREAS No results for input(s): LIPASE in the last 72 hours.       Studies/Results: No results found.  Medications: I have reviewed the patient's current medications.  Assessment/Plan: 1. Lower G.I. Bleeding. Probably due to hemorrhoids and over anticoagulation. Seems to have resolved since is over anticoagulation has been corrected. Would attempt to adjust his Coumadin appropriately and could be discharged and followed as outpatient.   Cicilia Clinger JR,Tauheed Mcfayden L 01/19/2014, 8:16 AM

## 2014-01-20 ENCOUNTER — Encounter (HOSPITAL_COMMUNITY): Payer: Self-pay

## 2014-01-20 DIAGNOSIS — K5793 Diverticulitis of intestine, part unspecified, without perforation or abscess with bleeding: Secondary | ICD-10-CM

## 2014-01-20 LAB — PROTIME-INR
INR: 1.61 — ABNORMAL HIGH (ref 0.00–1.49)
PROTHROMBIN TIME: 19.3 s — AB (ref 11.6–15.2)

## 2014-01-20 MED ORDER — BISACODYL 10 MG RE SUPP: 10.0000 mg | Freq: Every day | RECTAL | Status: DC | PRN

## 2014-01-20 MED ORDER — POLYETHYLENE GLYCOL 3350 17 G PO PACK: 17.0000 g | PACK | Freq: Every day | ORAL | Status: DC

## 2014-01-20 MED ORDER — WARFARIN SODIUM 3 MG PO TABS: 3.0000 mg | ORAL_TABLET | Freq: Every day | ORAL | Status: DC

## 2014-01-20 MED ORDER — HYDROCODONE-ACETAMINOPHEN 5-325 MG PO TABS: 1.0000 | ORAL_TABLET | Freq: Four times a day (QID) | ORAL | Status: DC | PRN

## 2014-01-20 NOTE — Discharge Summary (Signed)
Physician Discharge Summary  [name] FXT:[id number] DOB: Jun 22, 1932 DOA: 01/16/2014  PCP: [name], MD  Admit date: 01/16/2014 Discharge date: 01/20/2014  Time spent: 35 minutes  Recommendations for Outpatient Follow-up:  INR check on Thursday: goal 2.5-3.5 (may need to be on lower end if patient continues to have issues with bleeding) follow-up in the office with Dr. Acquanetta Sit his primary gastroenterologist in 3 to 4 weeks Cbc, BMP 1 week  Discharge Diagnoses:  Principal Problem:   GIB (gastrointestinal bleeding) Active Problems:   Ischemic cardiomyopathy   Diverticulitis   S/P aortic valve replacement   Atrial fibrillation, chronic   Anticoagulated on Coumadin   Vertebral fracture, osteoporotic   Automatic implantable cardioverter-defibrillator in situ   GI bleed   Discharge Condition: improved  Diet recommendation: cardiac  Filed Weights   01/16/14 1048 01/17/14 0428  Weight: 87.544 kg (193 lb) 84.4 kg (186 lb 1.1 oz)    History of present illness:  [name] is a 80 y.o. male with PMH of CAD s/p CABG, CHF, s/p AVR (TAVR) Ischemic cardiomyopathy s/p ICD, A fib on coumadin, CVA x 3, Recurrent PE, Prostate CA, Recurrent spinal fracture last (12/2013), presented with GIB; He reports that since compression fractures he developed constipation on opioids; after trying bowel regimen he had bloody bowel movement on mondays, then developed 2 episodes of hematochezia, with melena today; He reports prior h/o intermittent hemorrhoidal bleeding but without significant blood loss; no nausea, vomiting  He denies chest pain, no SOB, no dizziness, no palpitations, no new focal neuro symptoms;    Hospital Course:  LGIB: likely diverticular v hemorroidal. H/h and vitals stable. Pt denies further bleeding. Tolerating solids  CAD s/p CABG  CHF; ischemic cardiomyopathy LVEF 25% s/p ICD; s/p TAVR;  -clinically euvolemic  A fib, with recurrent CVA x 3; HR controlled; cont  BB; coumadin resumed at lower dose (supratherapeutic on admission). Was on 5 mg daily except mondays, 2.5. i have decreased to 3 mg daily- monitor INR as outpatient.  Recurrent spinal fracture last (12/2013); cont pain control. Worked with PT- no further follow up    Procedures:    Consultations:  GI  Discharge Exam: Filed Vitals:   01/20/14 0740  BP: 114/65  Pulse: 81  Temp:   Resp:     General: A+Ox3, NAD Cardiovascular: rrr Respiratory: clear  Discharge Instructions You were cared for by a hospitalist during your hospital stay. If you have any questions about your discharge medications or the care you received while you were in the hospital after you are discharged, you can call the unit and asked to speak with the hospitalist on call if the hospitalist that took care of you is not available. Once you are discharged, your primary care physician will handle any further medical issues. Please note that NO REFILLS for any discharge medications will be authorized once you are discharged, as it is imperative that you return to your primary care physician (or establish a relationship with a primary care physician if you do not have one) for your aftercare needs so that they can reassess your need for medications and monitor your lab values.  Discharge Instructions    Diet - low sodium heart healthy    Complete by:  As directed      Discharge instructions    Complete by:  As directed   INR Thursday and per PCP after that Daily BMs (soft) with miralax     Increase activity slowly  Complete by:  As directed           Current Discharge Medication List    START taking these medications   Details  bisacodyl (DULCOLAX) 10 MG suppository Place 1 suppository (10 mg total) rectally daily as needed for moderate constipation. Qty: 12 suppository, Refills: 0    !! polyethylene glycol (MIRALAX / GLYCOLAX) packet Take 17 g by mouth daily. Qty: 14 each, Refills: 0     !! -  Potential duplicate medications found. Please discuss with provider.    CONTINUE these medications which have CHANGED   Details  HYDROcodone-acetaminophen (NORCO/VICODIN) 5-325 MG per tablet Take 1 tablet by mouth every 6 (six) hours as needed for moderate pain. Qty: 30 tablet, Refills: 0    warfarin (COUMADIN) 3 MG tablet Take 1 tablet (3 mg total) by mouth daily. Qty: 15 tablet, Refills: 0      CONTINUE these medications which have NOT CHANGED   Details  acetaminophen (TYLENOL) 500 MG tablet Take 1,000 mg by mouth every 6 (six) hours as needed.    alfuzosin (UROXATRAL) 10 MG 24 hr tablet Take 10 mg by mouth at bedtime.     atorvastatin (LIPITOR) 10 MG tablet Take 10 mg by mouth every other day.     Budesonide-Formoterol Fumarate (SYMBICORT IN) Inhale 1 puff into the lungs 2 (two) times daily.    carvedilol (COREG) 6.25 MG tablet Take 6.25 mg by mouth 2 (two) times daily with a meal.    ipratropium (ATROVENT) 0.03 % nasal spray Place 2 sprays into both nostrils 2 (two) times daily.    levothyroxine (SYNTHROID, LEVOTHROID) 150 MCG tablet Take 150 mcg by mouth daily before breakfast.    methocarbamol (ROBAXIN) 500 MG tablet Take 500 mg by mouth 3 (three) times daily as needed for muscle spasms.    Multiple Vitamins-Minerals (MULTIVITAMIN PO) Take 1 tablet by mouth daily.    !! polyethylene glycol (MIRALAX / GLYCOLAX) packet Take 17 g by mouth daily as needed (constipation).    RESTASIS 0.05 % ophthalmic emulsion Place 1 drop into both eyes every 12 (twelve) hours.     traMADol (ULTRAM) 50 MG tablet Take 50 mg by mouth every 8 (eight) hours as needed for moderate pain.    nitroGLYCERIN (NITROSTAT) 0.4 MG SL tablet Place 0.4 mg under the tongue every 5 (five) minutes as needed for chest pain.    omeprazole (PRILOSEC) 20 MG capsule Take 20 mg by mouth daily as needed (acid reflux).     !! - Potential duplicate medications found. Please discuss with provider.    STOP taking  these medications     aspirin 81 MG chewable tablet      cholecalciferol (VITAMIN D) 1000 UNITS tablet      ondansetron (ZOFRAN) 4 MG tablet      predniSONE (DELTASONE) 20 MG tablet        Allergies  Allergen Reactions  . Penicillins Shortness Of Breath and Rash  . Ace Inhibitors     Has not tolerated in the past due to hyperkalemia  . Antihistamines, Diphenhydramine-Type Other (See Comments)    Inhibits urination  . Amiodarone Other (See Comments)    Unknown       The results of significant diagnostics from this hospitalization (including imaging, microbiology, ancillary and laboratory) are listed below for reference.    Significant Diagnostic Studies: No results found.  Microbiology: Recent Results (from the past 240 hour(s))  MRSA PCR Screening     Status: None  Collection Time: 01/16/14  5:00 PM  Result Value Ref Range Status   MRSA by PCR NEGATIVE NEGATIVE Final    Comment:        The GeneXpert MRSA Assay (FDA approved for NASAL specimens only), is one component of a comprehensive MRSA colonization surveillance program. It is not intended to diagnose MRSA infection nor to guide or monitor treatment for MRSA infections.      Labs: Basic Metabolic Panel:  Recent Labs Lab 01/16/14 1146 01/19/14 0345  NA 131* 131*  K 4.5 4.5  CL 92* 94*  CO2 26 25  GLUCOSE 104* 82  BUN 16 13  CREATININE 0.94 1.01  CALCIUM 9.1 9.2   Liver Function Tests:  Recent Labs Lab 01/16/14 1146  AST 18  ALT 12  ALKPHOS 94  BILITOT 1.0  PROT 6.4  ALBUMIN 3.5   No results for input(s): LIPASE, AMYLASE in the last 168 hours. No results for input(s): AMMONIA in the last 168 hours. CBC:  Recent Labs Lab 01/16/14 1146 01/16/14 1835 01/17/14 0039 01/17/14 0920 01/18/14 0340 01/19/14 0345  WBC 9.3  --  7.0  --  10.0  --   NEUTROABS 7.5  --   --   --   --   --   HGB 14.3 14.3 13.8 14.2 14.5 13.6  HCT 41.7 42.4 40.2 41.4 43.1 40.1  MCV 88.5  --  89.7  --  88.1   --   PLT 219  --  222  --  222  --    Cardiac Enzymes: No results for input(s): CKTOTAL, CKMB, CKMBINDEX, TROPONINI in the last 168 hours. BNP: BNP (last 3 results) No results for input(s): PROBNP in the last 8760 hours. CBG: No results for input(s): GLUCAP in the last 168 hours.     SignedEulogio Bear  [facility] 01/20/2014, 9:51 AM

## 2014-01-20 NOTE — Plan of Care (Signed)
Problem: Discharge Progression Outcomes Goal: Tolerating diet Outcome: Completed/Met Date Met:  01/20/14     

## 2014-01-20 NOTE — Plan of Care (Signed)
Problem: Phase III Progression Outcomes Goal: H&H stablized <1gm drop in 24 hrs, no active bleeding Outcome: Completed/Met Date Met:  01/20/14

## 2014-01-20 NOTE — Progress Notes (Signed)
[name] to be D/C'd Home per MD order.  Discussed with the patient and all questions fully answered.    Medication List    STOP taking these medications        aspirin 81 MG chewable tablet     cholecalciferol 1000 UNITS tablet  Commonly known as:  VITAMIN D     ondansetron 4 MG tablet  Commonly known as:  ZOFRAN     predniSONE 20 MG tablet  Commonly known as:  DELTASONE      TAKE these medications        acetaminophen 500 MG tablet  Commonly known as:  TYLENOL  Take 1,000 mg by mouth every 6 (six) hours as needed.     alfuzosin 10 MG 24 hr tablet  Commonly known as:  UROXATRAL  Take 10 mg by mouth at bedtime.     atorvastatin 10 MG tablet  Commonly known as:  LIPITOR  Take 10 mg by mouth every other day.     bisacodyl 10 MG suppository  Commonly known as:  DULCOLAX  Place 1 suppository (10 mg total) rectally daily as needed for moderate constipation.     carvedilol 6.25 MG tablet  Commonly known as:  COREG  Take 6.25 mg by mouth 2 (two) times daily with a meal.     HYDROcodone-acetaminophen 5-325 MG per tablet  Commonly known as:  NORCO/VICODIN  Take 1 tablet by mouth every 6 (six) hours as needed for moderate pain.     ipratropium 0.03 % nasal spray  Commonly known as:  ATROVENT  Place 2 sprays into both nostrils 2 (two) times daily.     levothyroxine 150 MCG tablet  Commonly known as:  SYNTHROID, LEVOTHROID  Take 150 mcg by mouth daily before breakfast.     methocarbamol 500 MG tablet  Commonly known as:  ROBAXIN  Take 500 mg by mouth 3 (three) times daily as needed for muscle spasms.     MULTIVITAMIN PO  Take 1 tablet by mouth daily.     nitroGLYCERIN 0.4 MG SL tablet  Commonly known as:  NITROSTAT  Place 0.4 mg under the tongue every 5 (five) minutes as needed for chest pain.     omeprazole 20 MG capsule  Commonly known as:  PRILOSEC  Take 20 mg by mouth daily as needed (acid reflux).     polyethylene glycol packet  Commonly known as:   MIRALAX / GLYCOLAX  Take 17 g by mouth daily.     polyethylene glycol packet  Commonly known as:  MIRALAX / GLYCOLAX  Take 17 g by mouth daily as needed (constipation).     RESTASIS 0.05 % ophthalmic emulsion  Generic drug:  cycloSPORINE  Place 1 drop into both eyes every 12 (twelve) hours.     SYMBICORT IN  Inhale 1 puff into the lungs 2 (two) times daily.     traMADol 50 MG tablet  Commonly known as:  ULTRAM  Take 50 mg by mouth every 8 (eight) hours as needed for moderate pain.     warfarin 3 MG tablet  Commonly known as:  COUMADIN  Take 1 tablet (3 mg total) by mouth daily.        VVS, Skin clean, dry and intact without evidence of skin break down, no evidence of skin tears noted. IV catheter discontinued intact. Site without signs and symptoms of complications. Dressing and pressure applied.  An After Visit Summary was printed and given to the patient.  D/c education completed with patient/family including follow up instructions, medication list, d/c activities limitations if indicated, with other d/c instructions as indicated by MD - patient able to verbalize understanding, all questions fully answered.   Patient instructed to return to ED, call 911, or call MD for any changes in condition.   Patient escorted via [place], and D/C home via private auto.  Delman Cheadle 01/20/2014 10:04 AM

## 2014-01-20 NOTE — Progress Notes (Signed)
Physical Therapy Treatment Patient Details Name: [name] MRN: [id number] DOB: 26-Dec-1932 Today's Date: 01/20/2014    History of Present Illness [name] is a 80 y.o. male with PMH of CAD s/p CABG, CHF, s/p AVR (TAVR) Ischemic cardiomyopathy s/p ICD, A fib on coumadin, CVA x 3, Recurrent PE, Prostate CA, Recurrent spinal fracture last (12/2013), presented with GI Bleed.. Pt with rcent compression fracture of L2.    PT Comments    Progressing well, generally supervision to independent.  Discussed posture and back care.  Follow Up Recommendations  No PT follow up     Equipment Recommendations  None recommended by PT    Recommendations for Other Services       Precautions / Restrictions Precautions Precautions: Fall;Back Required Braces or Orthoses: Spinal Brace Spinal Brace: Lumbar corset;Other (comment) Spinal Brace Comments: Pt brought from home due to recent L2 compression fracture.  Pt wears when up and applies in sitting at EOB. Restrictions Weight Bearing Restrictions: No    Mobility  Bed Mobility Overal bed mobility: Modified Independent Bed Mobility: Rolling;Sidelying to Sit;Sit to Sidelying Rolling: Modified independent (Device/Increase time) Sidelying to sit: Modified independent (Device/Increase time)     Sit to sidelying: Modified independent (Device/Increase time) General bed mobility comments: cues for proper body mechanics  Transfers Overall transfer level: Modified independent Equipment used: Rolling walker (2 wheeled) Transfers: Sit to/from Stand              Ambulation/Gait Ambulation/Gait assistance: Supervision Ambulation Distance (Feet): 300 Feet Assistive device: Rolling walker (2 wheeled) Gait Pattern/deviations: Step-through pattern Gait velocity: moderate   General Gait Details: Discussed being vigilant about maintaining posture, light use of the walker, proper stance in the RW   Stairs            Wheelchair  Mobility    Modified Rankin (Stroke Patients Only)       Balance Overall balance assessment: Needs assistance   Sitting balance-Leahy Scale: Good       Standing balance-Leahy Scale: Fair                      Cognition Arousal/Alertness: Awake/alert Behavior During Therapy: WFL for tasks assessed/performed Overall Cognitive Status: Within Functional Limits for tasks assessed                      Exercises      General Comments General comments (skin integrity, edema, etc.): Reinforced issues important for back care/prec, posture and progression of activity at home.      Pertinent Vitals/Pain Pain Assessment: 0-10 Pain Score: 5  Pain Location: low back Pain Descriptors / Indicators: Constant Pain Intervention(s): Limited activity within patient's tolerance    Home Living                      Prior Function            PT Goals (current goals can now be found in the care plan section) Acute Rehab PT Goals Patient Stated Goal: go home PT Goal Formulation: With patient Time For Goal Achievement: 01/26/14 Potential to Achieve Goals: Good Progress towards PT goals: Progressing toward goals    Frequency       PT Plan Current plan remains appropriate    Co-evaluation             End of Session Equipment Utilized During Treatment: Back brace Activity Tolerance: Patient tolerated treatment well Patient left: in bed;with  call bell/phone within reach     Time: 0956-1016 PT Time Calculation (min) (ACUTE ONLY): 20 min  Charges:  $Gait Training: 8-22 mins                    G Codes:      Sharron Simpson, Tessie Fass 01/20/2014, 10:24 AM 01/20/2014  Donnella Sham, PT [phone]  (pager)

## 2014-01-20 NOTE — Progress Notes (Signed)
[facility] PROGRESS NOTE Subjective Patient tolerating diet no signs of continued bleeding reports that stools are brown and loose  Objective: Vital signs in last 24 hours: Temp:  [97.6 F (36.4 C)-97.9 F (36.6 C)] 97.9 F (36.6 C) (11/10 0610) Pulse Rate:  [61-83] 81 (11/10 0740) Resp:  [16-24] 24 (11/10 0610) BP: (102-114)/(63-72) 114/65 mmHg (11/10 0740) SpO2:  [95 %-99 %] 99 % (11/10 0754) Last BM Date: 01/17/14  Intake/Output from previous day: 11/09 0701 - 11/10 0700 In: 440 [P.O.:440] Out: 600 [Urine:600] Intake/Output this shift:    PE: General--no acute distress  Abdomen--soft and nontender  Lab Results:  Recent Labs  01/17/14 0920 01/18/14 0340 01/19/14 0345  WBC  --  10.0  --   HGB 14.2 14.5 13.6  HCT 41.4 43.1 40.1  PLT  --  222  --    BMET  Recent Labs  01/19/14 0345  NA 131*  K 4.5  CL 94*  CO2 25  CREATININE 1.01   LFT No results for input(s): PROT, AST, ALT, ALKPHOS, BILITOT, BILIDIR, IBILI in the last 72 hours. PT/INR  Recent Labs  01/18/14 1153 01/19/14 0345 01/20/14 0619  LABPROT 19.8* 19.7* 19.3*  INR 1.67* 1.66* 1.61*   PANCREAS No results for input(s): LIPASE in the last 72 hours.       Studies/Results: No results found.  Medications: I have reviewed the patient's current medications.  Assessment/Plan: 1. Lower G.I. Bleed. Probably diverticular caused by over anticoagulation. Would try to get his INR adjusted appropriately and could go ahead and discharge on Miralax. Have discussed with him the need to adjust the dose in order to have consistently soft bowel movements. Would have him follow-up in the office with Dr. Acquanetta Sit his primary gastroenterologist in 3 to 4 weeks.   Maison Agrusa JR,Tequisha Maahs L 01/20/2014, 8:27 AM

## 2014-01-21 ENCOUNTER — Encounter (HOSPITAL_COMMUNITY): Payer: Self-pay

## 2014-01-22 ENCOUNTER — Encounter (HOSPITAL_COMMUNITY): Payer: Self-pay

## 2014-01-23 ENCOUNTER — Encounter (HOSPITAL_COMMUNITY): Payer: Self-pay

## 2014-01-26 ENCOUNTER — Encounter (HOSPITAL_COMMUNITY): Payer: Self-pay

## 2014-01-26 ENCOUNTER — Ambulatory Visit (INDEPENDENT_AMBULATORY_CARE_PROVIDER_SITE_OTHER): Payer: Medicare Other | Admitting: Pharmacist

## 2014-01-26 DIAGNOSIS — I482 Chronic atrial fibrillation, unspecified: Secondary | ICD-10-CM

## 2014-01-26 DIAGNOSIS — Z5181 Encounter for therapeutic drug level monitoring: Secondary | ICD-10-CM

## 2014-01-26 LAB — POCT INR: INR: 1.6

## 2014-01-27 ENCOUNTER — Encounter (HOSPITAL_COMMUNITY): Payer: Self-pay

## 2014-01-28 ENCOUNTER — Encounter (HOSPITAL_COMMUNITY): Payer: Self-pay

## 2014-01-29 ENCOUNTER — Encounter (HOSPITAL_COMMUNITY): Payer: Self-pay

## 2014-01-30 ENCOUNTER — Encounter (HOSPITAL_COMMUNITY): Payer: Self-pay

## 2014-02-02 ENCOUNTER — Encounter (HOSPITAL_COMMUNITY): Payer: Self-pay

## 2014-02-02 ENCOUNTER — Other Ambulatory Visit: Payer: Self-pay | Admitting: Cardiovascular Disease

## 2014-02-02 ENCOUNTER — Ambulatory Visit (INDEPENDENT_AMBULATORY_CARE_PROVIDER_SITE_OTHER): Payer: Medicare Other | Admitting: Cardiovascular Disease

## 2014-02-02 DIAGNOSIS — Z5181 Encounter for therapeutic drug level monitoring: Secondary | ICD-10-CM

## 2014-02-02 DIAGNOSIS — I482 Chronic atrial fibrillation, unspecified: Secondary | ICD-10-CM

## 2014-02-02 LAB — POCT INR: INR: 3

## 2014-02-03 ENCOUNTER — Encounter (HOSPITAL_COMMUNITY): Payer: Self-pay

## 2014-02-04 ENCOUNTER — Ambulatory Visit (INDEPENDENT_AMBULATORY_CARE_PROVIDER_SITE_OTHER): Payer: Medicare Other | Admitting: Pharmacist

## 2014-02-04 ENCOUNTER — Encounter (HOSPITAL_COMMUNITY): Payer: Self-pay

## 2014-02-04 DIAGNOSIS — Z5181 Encounter for therapeutic drug level monitoring: Secondary | ICD-10-CM

## 2014-02-04 DIAGNOSIS — I482 Chronic atrial fibrillation, unspecified: Secondary | ICD-10-CM

## 2014-02-04 LAB — POCT INR: INR: 4.3

## 2014-02-04 NOTE — Telephone Encounter (Signed)
Sherren Mocha, MD at 11/26/2013 3:47 PM  carvedilol (COREG) 6.25 MG tablet Take 6.25 mg by mouth 2 (two) times daily    Your physician has recommended you make the following change in your medication:  START Losartan 25mg  take one by mouth daily  Your physician recommends that you schedule a follow-up appointment in: 1 MONTH with Truitt Merle NP

## 2014-02-09 ENCOUNTER — Encounter (HOSPITAL_COMMUNITY): Payer: Self-pay

## 2014-02-09 DIAGNOSIS — Z6822 Body mass index (BMI) 22.0-22.9, adult: Secondary | ICD-10-CM | POA: Diagnosis not present

## 2014-02-09 DIAGNOSIS — R05 Cough: Secondary | ICD-10-CM | POA: Diagnosis not present

## 2014-02-09 DIAGNOSIS — R110 Nausea: Secondary | ICD-10-CM | POA: Diagnosis not present

## 2014-02-09 DIAGNOSIS — Z7901 Long term (current) use of anticoagulants: Secondary | ICD-10-CM | POA: Diagnosis not present

## 2014-02-09 DIAGNOSIS — I482 Chronic atrial fibrillation: Secondary | ICD-10-CM | POA: Diagnosis not present

## 2014-02-09 DIAGNOSIS — R269 Unspecified abnormalities of gait and mobility: Secondary | ICD-10-CM | POA: Diagnosis not present

## 2014-02-09 DIAGNOSIS — R109 Unspecified abdominal pain: Secondary | ICD-10-CM | POA: Diagnosis not present

## 2014-02-10 ENCOUNTER — Encounter (HOSPITAL_COMMUNITY): Payer: Self-pay

## 2014-02-11 ENCOUNTER — Ambulatory Visit (INDEPENDENT_AMBULATORY_CARE_PROVIDER_SITE_OTHER): Payer: Medicare Other | Admitting: Interventional Cardiology

## 2014-02-11 ENCOUNTER — Encounter (HOSPITAL_COMMUNITY): Payer: Self-pay

## 2014-02-11 DIAGNOSIS — I482 Chronic atrial fibrillation, unspecified: Secondary | ICD-10-CM

## 2014-02-11 DIAGNOSIS — Z7901 Long term (current) use of anticoagulants: Secondary | ICD-10-CM | POA: Diagnosis not present

## 2014-02-11 DIAGNOSIS — I4891 Unspecified atrial fibrillation: Secondary | ICD-10-CM | POA: Diagnosis not present

## 2014-02-11 DIAGNOSIS — Z5181 Encounter for therapeutic drug level monitoring: Secondary | ICD-10-CM

## 2014-02-11 LAB — POCT INR: INR: 2.2

## 2014-02-12 ENCOUNTER — Encounter (HOSPITAL_COMMUNITY): Payer: Self-pay

## 2014-02-13 ENCOUNTER — Encounter (HOSPITAL_COMMUNITY): Payer: Self-pay

## 2014-02-16 ENCOUNTER — Encounter (HOSPITAL_COMMUNITY): Payer: Self-pay

## 2014-02-17 ENCOUNTER — Encounter (HOSPITAL_COMMUNITY): Payer: Self-pay

## 2014-02-18 ENCOUNTER — Ambulatory Visit (INDEPENDENT_AMBULATORY_CARE_PROVIDER_SITE_OTHER): Payer: Medicare Other | Admitting: *Deleted

## 2014-02-18 ENCOUNTER — Encounter (HOSPITAL_COMMUNITY): Payer: Self-pay

## 2014-02-18 DIAGNOSIS — I482 Chronic atrial fibrillation, unspecified: Secondary | ICD-10-CM

## 2014-02-18 DIAGNOSIS — Z5181 Encounter for therapeutic drug level monitoring: Secondary | ICD-10-CM

## 2014-02-18 LAB — POCT INR: INR: 2.3

## 2014-02-19 ENCOUNTER — Encounter (HOSPITAL_COMMUNITY): Payer: Self-pay | Admitting: Internal Medicine

## 2014-02-19 ENCOUNTER — Encounter (HOSPITAL_COMMUNITY): Payer: Self-pay

## 2014-02-20 ENCOUNTER — Encounter (HOSPITAL_COMMUNITY): Payer: Self-pay

## 2014-02-23 ENCOUNTER — Encounter (HOSPITAL_COMMUNITY): Payer: Self-pay

## 2014-02-24 ENCOUNTER — Encounter (HOSPITAL_COMMUNITY): Payer: Self-pay

## 2014-02-25 ENCOUNTER — Ambulatory Visit (INDEPENDENT_AMBULATORY_CARE_PROVIDER_SITE_OTHER): Payer: Medicare Other | Admitting: Pharmacist

## 2014-02-25 ENCOUNTER — Encounter (HOSPITAL_COMMUNITY): Payer: Self-pay

## 2014-02-25 DIAGNOSIS — I482 Chronic atrial fibrillation, unspecified: Secondary | ICD-10-CM

## 2014-02-25 DIAGNOSIS — Z5181 Encounter for therapeutic drug level monitoring: Secondary | ICD-10-CM

## 2014-02-25 LAB — POCT INR: INR: 2.4

## 2014-02-26 ENCOUNTER — Encounter (HOSPITAL_COMMUNITY): Payer: Self-pay

## 2014-02-27 ENCOUNTER — Encounter (HOSPITAL_COMMUNITY): Payer: Self-pay

## 2014-03-02 ENCOUNTER — Encounter (HOSPITAL_COMMUNITY): Payer: Self-pay

## 2014-03-03 ENCOUNTER — Encounter (HOSPITAL_COMMUNITY): Payer: Self-pay

## 2014-03-04 ENCOUNTER — Encounter (HOSPITAL_COMMUNITY): Payer: Self-pay

## 2014-03-05 ENCOUNTER — Encounter (HOSPITAL_COMMUNITY): Payer: Self-pay

## 2014-03-09 ENCOUNTER — Encounter: Payer: Self-pay | Admitting: Internal Medicine

## 2014-03-09 ENCOUNTER — Ambulatory Visit (INDEPENDENT_AMBULATORY_CARE_PROVIDER_SITE_OTHER): Payer: Medicare Other | Admitting: Internal Medicine

## 2014-03-09 ENCOUNTER — Encounter (HOSPITAL_COMMUNITY): Payer: Self-pay

## 2014-03-09 VITALS — BP 116/60 | HR 69 | Ht 75.0 in | Wt 186.8 lb

## 2014-03-09 DIAGNOSIS — I472 Ventricular tachycardia: Secondary | ICD-10-CM | POA: Diagnosis not present

## 2014-03-09 DIAGNOSIS — I482 Chronic atrial fibrillation, unspecified: Secondary | ICD-10-CM

## 2014-03-09 DIAGNOSIS — Z954 Presence of other heart-valve replacement: Secondary | ICD-10-CM

## 2014-03-09 DIAGNOSIS — Z95810 Presence of automatic (implantable) cardiac defibrillator: Secondary | ICD-10-CM | POA: Diagnosis not present

## 2014-03-09 DIAGNOSIS — I4729 Other ventricular tachycardia: Secondary | ICD-10-CM

## 2014-03-09 DIAGNOSIS — Z952 Presence of prosthetic heart valve: Secondary | ICD-10-CM

## 2014-03-09 DIAGNOSIS — I255 Ischemic cardiomyopathy: Secondary | ICD-10-CM

## 2014-03-09 LAB — MDC_IDC_ENUM_SESS_TYPE_INCLINIC
Battery Voltage: 3.04 V
Brady Statistic RV Percent Paced: 3.72 %
Date Time Interrogation Session: 20151228113444
HighPow Impedance: 228 Ohm
HighPow Impedance: 40 Ohm
HighPow Impedance: 54 Ohm
Lead Channel Impedance Value: 551 Ohm
Lead Channel Pacing Threshold Amplitude: 0.875 V
Lead Channel Pacing Threshold Pulse Width: 0.4 ms
Lead Channel Sensing Intrinsic Amplitude: 15.75 mV
Lead Channel Setting Pacing Pulse Width: 0.4 ms
MDC IDC MSMT BATTERY REMAINING LONGEVITY: 131 mo
MDC IDC MSMT LEADCHNL RV SENSING INTR AMPL: 21.75 mV
MDC IDC SET LEADCHNL RV PACING AMPLITUDE: 2 V
MDC IDC SET LEADCHNL RV SENSING SENSITIVITY: 0.3 mV
Zone Setting Detection Interval: 300 ms
Zone Setting Detection Interval: 400 ms
Zone Setting Detection Interval: 450 ms

## 2014-03-09 NOTE — Progress Notes (Addendum)
Patient Care Team: [name], MD as PCP - General (Internal Medicine)   HPI  [name] is a 80 y.o. male seen in followup for ischemic cardiomyopathy for which he underwent ICD implantation for primary prevention. He has had appropriate therapy for ventricular tachycardia. He underwent generator 12/14 He is status post bypass surgery.  He alo underwent percutaneous aortic valve replacement.with some perivalvular AI LVEDD increased and ARB added 9/15; he was unable to tolerate this.  Just prior to follow-up he had a second back fracture which left him immobilized her number of weeks. He is now back at home he is gradually increasing his efforts. There is significant fatigue    He also has a history of atrial fibrillation for which amiodarone was initiated. He was intolerant of this and this has been discontinued. His atrial fibrillation is now permanent. He is on warfarin            Past Medical History  Diagnosis Date  . Ischemic cardiomyopathy 05/2010    Has EF of 25%  . Diverticulitis     CURRENTLY CONTROLLED WITH NO EVIDENCE OF RECURRENT INFECTION  . AF (atrial fibrillation)     Has not tolerated amiodarone in the past. Amiodarone was stopped in September of 2010 due to side effects  . Chronic anticoagulation     on coumadin  . Prostate cancer   . Osteoarthritis     RIGHT KNEE  . Aortic stenosis, severe     WITH PERCUTANEOUS AORTIC VALVE (TAVI) IN March 2012 at the [facility]  . MI (myocardial infarction) 1974, 1978  . Cervical myelopathy   . Pulmonary embolism   . Hyperlipidemia   . GERD (gastroesophageal reflux disease)   . Esophageal stricture     WITH DILATATION  . NSVT (nonsustained ventricular tachycardia)   . Coronary artery disease   . Stroke   . Heart murmur     Past Surgical History  Procedure Laterality Date  . Icd  Feb 2003; 02/2013    gen change 02-11-2013 by Dr Caryl Comes  . Coronary artery bypass graft  1979  . Coronary artery bypass  graft  1991    REDO SURGERY  . Cardiac catheterization  2010    SEVERE LV DYSFUNCTION WITH ESTIMATED EJECTION FRACTION OF 25%  . Cholecystectomy    . Aortic valve replacement      Percutaneous AVR in March 2012 at the [facility]  . Cardioversion  02/16/2011    Procedure: CARDIOVERSION;  Surgeon: Carlena Bjornstad, MD;  Location: [facility];  Service: Cardiovascular;  Laterality: N/A;  . Shoulder surgery    . Back surgery    . Implantable cardioverter defibrillator (icd) generator change N/A 02/10/2013    Procedure: ICD GENERATOR CHANGE;  Surgeon: Deboraha Sprang, MD;  Location: [facility] C[facility] LAB;  Service: Cardiovascular;  Laterality: N/A;    Current Outpatient Prescriptions  Medication Sig Dispense Refill  . acetaminophen (TYLENOL) 500 MG tablet Take 1,000 mg by mouth every 6 (six) hours as needed.    Marland Kitchen alfuzosin (UROXATRAL) 10 MG 24 hr tablet Take 10 mg by mouth at bedtime.     Marland Kitchen atorvastatin (LIPITOR) 10 MG tablet Take 10 mg by mouth every other day.     . Budesonide-Formoterol Fumarate (SYMBICORT IN) Inhale 1 puff into the lungs 2 (two) times daily.    . carvedilol (COREG) 6.25 MG tablet TAKE 1 TABLET TWICE DAILY WITH A MEAL. 60 tablet 6  . HYDROcodone-acetaminophen (NORCO/VICODIN) 5-325 MG  per tablet Take 1 tablet by mouth every 6 (six) hours as needed for moderate pain. 30 tablet 0  . ipratropium (ATROVENT) 0.03 % nasal spray Place 2 sprays into both nostrils 2 (two) times daily.    Marland Kitchen levothyroxine (SYNTHROID, LEVOTHROID) 150 MCG tablet Take 150 mcg by mouth daily before breakfast.    . methocarbamol (ROBAXIN) 500 MG tablet Take 500 mg by mouth 3 (three) times daily as needed for muscle spasms.    . Multiple Vitamins-Minerals (MULTIVITAMIN PO) Take 1 tablet by mouth daily.    . nitroGLYCERIN (NITROSTAT) 0.4 MG SL tablet Place 0.4 mg under the tongue every 5 (five) minutes as needed for chest pain.    Marland Kitchen omeprazole (PRILOSEC) 20 MG capsule Take 20 mg by mouth daily as needed (acid reflux).    .  polyethylene glycol (MIRALAX / GLYCOLAX) packet Take 17 g by mouth daily as needed (constipation).    . RESTASIS 0.05 % ophthalmic emulsion Place 1 drop into both eyes every 12 (twelve) hours.     . traMADol (ULTRAM) 50 MG tablet Take 50 mg by mouth every 8 (eight) hours as needed for moderate pain.    Marland Kitchen warfarin (COUMADIN) 3 MG tablet Take 1 tablet (3 mg total) by mouth daily. 15 tablet 0   No current facility-administered medications for this visit.    Allergies  Allergen Reactions  . Penicillins Shortness Of Breath and Rash  . Ace Inhibitors     Has not tolerated in the past due to hyperkalemia  . Antihistamines, Diphenhydramine-Type Other (See Comments)    Inhibits urination  . Amiodarone Other (See Comments)    Unknown     Review of Systems negative except from HPI and PMH  Physical Exam BP 116/60 mmHg  Pulse 69  Ht 6\' 3"  (1.905 m)  Wt 186 lb 12.8 oz (84.732 kg)  BMI 23.35 kg/m2 Well developed and well nourished in no acute distress HENT normal E scleral and icterus clear Neck Supple JVP flat; carotids brisk and full Clear to ausculation  Device pocket well healed; without hematoma or erythema.  There is no tethering  Regular rate and rhythm, 2/6 murmur Soft with active bowel sounds No clubbing cyanosis none Edema Alert and oriented, grossly normal motor and sensory function Skin Warm and Dry  ECG demonstrates atrial fibrillation was infrequent ventricular pacing  Assessment and  Plan  Atrial fibrillation-permanent  Congestive heart failure-chronic-systolic  History of tablet with perivalvular AI  Recent back fracture  Implantable defibrillator-[employer]  Functional status is gradually improving. There is mild elevation of jugular venous pressure and in the context of his optivol being up I am concerned about volume accumulation. However, he is having no symptoms currently so we will have him resubmit a [facility] transmission in about 10 days and make a  decision at that point regarding reinitiation of the diuretic. We will arrange follow-up with Dr. Billee Cashing ; as noted above ARB was not tolerated  Atrial fibrillation permanent he is anticoagulated currently with warfarin INRs are stable and GI bleeding attributed to hemorrhoids has resolved. This occurred in the context of an elevated INR associated with antibiotics used to treat his costal acquired pneumonia

## 2014-03-09 NOTE — Patient Instructions (Addendum)
Your physician wants you to follow-up in: 12 months with Dr. Gari Crown will receive a reminder letter in the mail two months in advance. If you don't receive a letter, please call our office to schedule the follow-up appointment.  Remote monitoring is used to monitor your Pacemaker or ICD from home. This monitoring reduces the number of office visits required to check your device to one time per year. It allows [place] to keep an eye on the functioning of your device to ensure it is working properly. You are scheduled for a device check from home on 03/19/14 to look at [facility] may send your transmission at any time that day. If you have a wireless device, the transmission will be sent automatically. After your physician reviews your transmission, you will receive a postcard with your next transmission date.  Your physician recommends that you schedule a follow-up appointment with Dr. Burt Knack in 4 weeks with echo   Your physician has requested that you have an echocardiogram. Echocardiography is a painless test that uses sound waves to create images of your heart. It provides your doctor with information about the size and shape of your heart and how well your heart's chambers and valves are working. This procedure takes approximately one hour. There are no restrictions for this procedure.  Your physician recommends that you return for lab work ---next week CBC       Remote monitoring is used to monitor your Pacemaker or ICD from home. This monitoring reduces the number of office visits required to check your device to one time per year. It allows [place] to keep an eye on the functioning of your device to ensure it is working properly. You are scheduled for a device check from home on 06/08/14. You may send your transmission at any time that day. If you have a wireless device, the transmission will be sent automatically. After your physician reviews your transmission, you will receive a postcard with your next  transmission date.

## 2014-03-10 ENCOUNTER — Encounter (HOSPITAL_COMMUNITY): Payer: Self-pay

## 2014-03-11 ENCOUNTER — Encounter (HOSPITAL_COMMUNITY): Payer: Self-pay

## 2014-03-11 ENCOUNTER — Ambulatory Visit (INDEPENDENT_AMBULATORY_CARE_PROVIDER_SITE_OTHER): Payer: Medicare Other | Admitting: Pharmacist

## 2014-03-11 DIAGNOSIS — I482 Chronic atrial fibrillation, unspecified: Secondary | ICD-10-CM

## 2014-03-11 DIAGNOSIS — Z5181 Encounter for therapeutic drug level monitoring: Secondary | ICD-10-CM

## 2014-03-11 LAB — POCT INR: INR: 2.6

## 2014-03-12 ENCOUNTER — Encounter (HOSPITAL_COMMUNITY): Payer: Self-pay

## 2014-03-16 ENCOUNTER — Encounter (HOSPITAL_COMMUNITY): Payer: Self-pay

## 2014-03-17 ENCOUNTER — Other Ambulatory Visit (INDEPENDENT_AMBULATORY_CARE_PROVIDER_SITE_OTHER): Payer: Medicare Other | Admitting: *Deleted

## 2014-03-17 ENCOUNTER — Encounter (HOSPITAL_COMMUNITY): Payer: Self-pay

## 2014-03-17 DIAGNOSIS — I482 Chronic atrial fibrillation, unspecified: Secondary | ICD-10-CM

## 2014-03-17 DIAGNOSIS — M25561 Pain in right knee: Secondary | ICD-10-CM | POA: Diagnosis not present

## 2014-03-17 DIAGNOSIS — M545 Low back pain: Secondary | ICD-10-CM | POA: Diagnosis not present

## 2014-03-17 LAB — CBC WITH DIFFERENTIAL/PLATELET
BASOS PCT: 0.4 % (ref 0.0–3.0)
Basophils Absolute: 0 10*3/uL (ref 0.0–0.1)
Eosinophils Absolute: 0.2 10*3/uL (ref 0.0–0.7)
Eosinophils Relative: 2.9 % (ref 0.0–5.0)
HCT: 41.6 % (ref 39.0–52.0)
Hemoglobin: 13.6 g/dL (ref 13.0–17.0)
LYMPHS PCT: 13.7 % (ref 12.0–46.0)
Lymphs Abs: 1 10*3/uL (ref 0.7–4.0)
MCHC: 32.6 g/dL (ref 30.0–36.0)
MCV: 92.8 fl (ref 78.0–100.0)
MONO ABS: 0.6 10*3/uL (ref 0.1–1.0)
Monocytes Relative: 9.1 % (ref 3.0–12.0)
Neutro Abs: 5.2 10*3/uL (ref 1.4–7.7)
Neutrophils Relative %: 73.9 % (ref 43.0–77.0)
PLATELETS: 215 10*3/uL (ref 150.0–400.0)
RBC: 4.49 Mil/uL (ref 4.22–5.81)
RDW: 14.9 % (ref 11.5–15.5)
WBC: 7 10*3/uL (ref 4.0–10.5)

## 2014-03-18 ENCOUNTER — Encounter (HOSPITAL_COMMUNITY): Payer: Self-pay

## 2014-03-19 ENCOUNTER — Telehealth: Payer: Self-pay | Admitting: Cardiology

## 2014-03-19 ENCOUNTER — Ambulatory Visit (INDEPENDENT_AMBULATORY_CARE_PROVIDER_SITE_OTHER): Payer: Medicare Other | Admitting: *Deleted

## 2014-03-19 ENCOUNTER — Encounter (HOSPITAL_COMMUNITY): Payer: Self-pay

## 2014-03-19 ENCOUNTER — Telehealth: Payer: Self-pay | Admitting: Internal Medicine

## 2014-03-19 DIAGNOSIS — Z95810 Presence of automatic (implantable) cardiac defibrillator: Secondary | ICD-10-CM

## 2014-03-19 LAB — MDC_IDC_ENUM_SESS_TYPE_REMOTE
Battery Voltage: 3.03 V
Brady Statistic RV Percent Paced: 2.89 %
Date Time Interrogation Session: 20160107191652
HIGH POWER IMPEDANCE MEASURED VALUE: 38 Ohm
HIGH POWER IMPEDANCE MEASURED VALUE: 49 Ohm
Lead Channel Impedance Value: 418 Ohm
Lead Channel Impedance Value: 551 Ohm
Lead Channel Pacing Threshold Amplitude: 0.875 V
Lead Channel Sensing Intrinsic Amplitude: 15.5 mV
Lead Channel Setting Pacing Amplitude: 2 V
Lead Channel Setting Pacing Pulse Width: 0.4 ms
MDC IDC MSMT BATTERY REMAINING LONGEVITY: 131 mo
MDC IDC MSMT LEADCHNL RV PACING THRESHOLD PULSEWIDTH: 0.4 ms
MDC IDC MSMT LEADCHNL RV SENSING INTR AMPL: 15.5 mV
MDC IDC SET LEADCHNL RV SENSING SENSITIVITY: 0.3 mV
MDC IDC SET ZONE DETECTION INTERVAL: 450 ms
Zone Setting Detection Interval: 300 ms
Zone Setting Detection Interval: 400 ms

## 2014-03-19 NOTE — Telephone Encounter (Signed)
Informed pt that transmission was received.  

## 2014-03-19 NOTE — Telephone Encounter (Signed)
New message     Did you get his remote transmission?

## 2014-03-19 NOTE — Progress Notes (Signed)
Remote ICD transmission.   

## 2014-03-19 NOTE — Telephone Encounter (Signed)
Spoke with pt and reminded pt of remote transmission that is due today. Pt verbalized understanding.   

## 2014-03-20 ENCOUNTER — Telehealth: Payer: Self-pay | Admitting: Cardiology

## 2014-03-20 ENCOUNTER — Encounter (HOSPITAL_COMMUNITY): Payer: Self-pay

## 2014-03-20 NOTE — Telephone Encounter (Signed)
Pt was scheduled to send a remote transmission on Thursday 03-19-14. In the notes section of the appt it stated optivol only and to show to MD. I showed MD transmission. He informed me to tell the pt that results were normal and he wanted him to send another transmission in 2 weeks. Pt aware that he will be scheduled for 04-02-14.

## 2014-03-23 ENCOUNTER — Encounter (HOSPITAL_COMMUNITY): Payer: Self-pay

## 2014-03-24 ENCOUNTER — Other Ambulatory Visit: Payer: Self-pay | Admitting: Physical Medicine and Rehabilitation

## 2014-03-24 ENCOUNTER — Encounter (HOSPITAL_COMMUNITY): Payer: Self-pay

## 2014-03-24 DIAGNOSIS — M8088XA Other osteoporosis with current pathological fracture, vertebra(e), initial encounter for fracture: Secondary | ICD-10-CM | POA: Diagnosis not present

## 2014-03-24 DIAGNOSIS — M545 Low back pain: Secondary | ICD-10-CM | POA: Diagnosis not present

## 2014-03-24 DIAGNOSIS — M8008XA Age-related osteoporosis with current pathological fracture, vertebra(e), initial encounter for fracture: Secondary | ICD-10-CM | POA: Diagnosis not present

## 2014-03-24 DIAGNOSIS — R5383 Other fatigue: Secondary | ICD-10-CM | POA: Diagnosis not present

## 2014-03-24 DIAGNOSIS — M546 Pain in thoracic spine: Secondary | ICD-10-CM | POA: Diagnosis not present

## 2014-03-24 DIAGNOSIS — IMO0002 Reserved for concepts with insufficient information to code with codable children: Secondary | ICD-10-CM

## 2014-03-24 DIAGNOSIS — M810 Age-related osteoporosis without current pathological fracture: Secondary | ICD-10-CM | POA: Diagnosis not present

## 2014-03-24 DIAGNOSIS — E559 Vitamin D deficiency, unspecified: Secondary | ICD-10-CM | POA: Diagnosis not present

## 2014-03-25 ENCOUNTER — Encounter (HOSPITAL_COMMUNITY): Payer: Self-pay

## 2014-03-25 ENCOUNTER — Ambulatory Visit
Admission: RE | Admit: 2014-03-25 | Discharge: 2014-03-25 | Disposition: A | Discharge status: Home / Self Care | Payer: Medicare Other | Source: Ambulatory Visit | Attending: Physical Medicine and Rehabilitation | Admitting: Physical Medicine and Rehabilitation

## 2014-03-25 ENCOUNTER — Ambulatory Visit (INDEPENDENT_AMBULATORY_CARE_PROVIDER_SITE_OTHER): Payer: Medicare Other | Admitting: Pharmacist

## 2014-03-25 DIAGNOSIS — I482 Chronic atrial fibrillation, unspecified: Secondary | ICD-10-CM

## 2014-03-25 DIAGNOSIS — IMO0002 Reserved for concepts with insufficient information to code with codable children: Secondary | ICD-10-CM

## 2014-03-25 DIAGNOSIS — I4891 Unspecified atrial fibrillation: Secondary | ICD-10-CM | POA: Diagnosis not present

## 2014-03-25 DIAGNOSIS — Z5181 Encounter for therapeutic drug level monitoring: Secondary | ICD-10-CM

## 2014-03-25 DIAGNOSIS — M4845XA Fatigue fracture of vertebra, thoracolumbar region, initial encounter for fracture: Secondary | ICD-10-CM | POA: Diagnosis not present

## 2014-03-25 DIAGNOSIS — Z7901 Long term (current) use of anticoagulants: Secondary | ICD-10-CM | POA: Diagnosis not present

## 2014-03-25 DIAGNOSIS — M47816 Spondylosis without myelopathy or radiculopathy, lumbar region: Secondary | ICD-10-CM | POA: Diagnosis not present

## 2014-03-25 LAB — POCT INR: INR: 2.5

## 2014-03-26 ENCOUNTER — Encounter (HOSPITAL_COMMUNITY): Payer: Self-pay

## 2014-03-26 DIAGNOSIS — Z13820 Encounter for screening for osteoporosis: Secondary | ICD-10-CM | POA: Diagnosis not present

## 2014-03-27 ENCOUNTER — Encounter (HOSPITAL_COMMUNITY): Payer: Self-pay

## 2014-03-28 ENCOUNTER — Other Ambulatory Visit: Payer: Self-pay | Admitting: Cardiovascular Disease

## 2014-03-30 ENCOUNTER — Encounter (HOSPITAL_COMMUNITY): Payer: Self-pay

## 2014-03-31 ENCOUNTER — Encounter (HOSPITAL_COMMUNITY): Payer: Self-pay

## 2014-04-01 ENCOUNTER — Encounter (HOSPITAL_COMMUNITY): Payer: Self-pay

## 2014-04-02 ENCOUNTER — Other Ambulatory Visit (HOSPITAL_COMMUNITY): Payer: Self-pay | Admitting: Interventional Radiology

## 2014-04-02 ENCOUNTER — Encounter (HOSPITAL_COMMUNITY): Payer: Self-pay

## 2014-04-02 ENCOUNTER — Ambulatory Visit (INDEPENDENT_AMBULATORY_CARE_PROVIDER_SITE_OTHER): Payer: Medicare Other | Admitting: *Deleted

## 2014-04-02 ENCOUNTER — Encounter: Payer: Self-pay | Admitting: *Deleted

## 2014-04-02 ENCOUNTER — Telehealth (HOSPITAL_COMMUNITY): Payer: Self-pay | Admitting: Interventional Radiology

## 2014-04-02 DIAGNOSIS — M549 Dorsalgia, unspecified: Secondary | ICD-10-CM

## 2014-04-02 DIAGNOSIS — I255 Ischemic cardiomyopathy: Secondary | ICD-10-CM

## 2014-04-02 DIAGNOSIS — IMO0002 Reserved for concepts with insufficient information to code with codable children: Secondary | ICD-10-CM

## 2014-04-02 DIAGNOSIS — Z95810 Presence of automatic (implantable) cardiac defibrillator: Secondary | ICD-10-CM

## 2014-04-02 NOTE — Progress Notes (Signed)
EPIC Encounter for ICM Monitoring  Patient Name: [name] is a 81 y.o. male Date: 04/02/2014 Primary Care Physican: [name], MD Primary Cardiologist: Burt Knack Electrophysiologist: Caryl Comes Dry Weight: 179 lbs       In the past month, have you:  1. Gained more than 2 pounds in a day or more than 5 pounds in a week? This is my first ICM check on the patient. He reports to me that his baseline weight was about 210 lbs not long ago. He states that he he has had issues with his hip and spine- he was in the in ER 10/2014 for hip pain and his weight was 190 lbs. He was admitted on 11/6-11/10/15 for a GI bleed. His weight around discharge was 186 lbs. He reports that due to some of the medications he has taken, he developed nausea and lost his appetite. His weight dropped down to 170 lbs at its lowest point.. Now he is over his nausea and his appetite has picked back up. He has had a gradual increase in his weight up to 179 lbs today. He is weighing daily. His optivol readings started to increase in early December. I asked if this was about the time his appetite returned to normal and he felt this was the case.   2. Had changes in your medications (with verification of current medications)? no  3. Had more shortness of breath than is usual for you? no  4. Limited your activity because of shortness of breath? no  5. Not been able to sleep because of shortness of breath? no  6. Had increased swelling in your feet or ankles? no  7. Had symptoms of dehydration (dizziness, dry mouth, increased thirst, decreased urine output) no  8. Had changes in sodium restriction? no  9. Been compliant with medication? Yes   ICM trend:   Follow-up plan: ICM clinic phone appointment: 05/11/14. The patient's optivol trends today were reviewed with Dr. Caryl Comes. Impedence seems to be leveling off. The patient is without symptoms today. He is scheduled to follow back up with Dr. Burt Knack and have a repeat echo on  04/08/14. We will do an optivol check in the office same day. No other changes made today. The patient is aware and agreeable.   Copy of note sent to patient's primary care physician, primary cardiologist, and device following physician.  Alvis Lemmings, RN, BSN 04/02/2014 12:05 PM

## 2014-04-02 NOTE — Telephone Encounter (Signed)
Called pt, left VM for him to call to schedule new pt consult for compression fracture. JM

## 2014-04-03 ENCOUNTER — Encounter (HOSPITAL_COMMUNITY): Payer: Self-pay

## 2014-04-06 ENCOUNTER — Encounter (HOSPITAL_COMMUNITY): Payer: Self-pay

## 2014-04-07 ENCOUNTER — Other Ambulatory Visit (HOSPITAL_COMMUNITY): Payer: Medicare Other

## 2014-04-07 ENCOUNTER — Encounter (HOSPITAL_COMMUNITY): Payer: Self-pay

## 2014-04-08 ENCOUNTER — Encounter: Payer: Self-pay | Admitting: Cardiovascular Disease

## 2014-04-08 ENCOUNTER — Ambulatory Visit (HOSPITAL_COMMUNITY): Payer: Medicare Other | Attending: Internal Medicine | Admitting: Radiology

## 2014-04-08 ENCOUNTER — Ambulatory Visit (INDEPENDENT_AMBULATORY_CARE_PROVIDER_SITE_OTHER): Payer: Medicare Other | Admitting: Cardiovascular Disease

## 2014-04-08 ENCOUNTER — Encounter (HOSPITAL_COMMUNITY): Payer: Self-pay

## 2014-04-08 VITALS — BP 120/56 | HR 76 | Ht 75.0 in | Wt 189.0 lb

## 2014-04-08 DIAGNOSIS — Z952 Presence of prosthetic heart valve: Secondary | ICD-10-CM

## 2014-04-08 DIAGNOSIS — E785 Hyperlipidemia, unspecified: Secondary | ICD-10-CM

## 2014-04-08 DIAGNOSIS — I359 Nonrheumatic aortic valve disorder, unspecified: Secondary | ICD-10-CM

## 2014-04-08 DIAGNOSIS — Z125 Encounter for screening for malignant neoplasm of prostate: Secondary | ICD-10-CM | POA: Diagnosis not present

## 2014-04-08 DIAGNOSIS — Z954 Presence of other heart-valve replacement: Secondary | ICD-10-CM | POA: Insufficient documentation

## 2014-04-08 DIAGNOSIS — I255 Ischemic cardiomyopathy: Secondary | ICD-10-CM

## 2014-04-08 DIAGNOSIS — I2510 Atherosclerotic heart disease of native coronary artery without angina pectoris: Secondary | ICD-10-CM | POA: Diagnosis not present

## 2014-04-08 DIAGNOSIS — R739 Hyperglycemia, unspecified: Secondary | ICD-10-CM | POA: Diagnosis not present

## 2014-04-08 DIAGNOSIS — Z0000 Encounter for general adult medical examination without abnormal findings: Secondary | ICD-10-CM | POA: Diagnosis not present

## 2014-04-08 DIAGNOSIS — E559 Vitamin D deficiency, unspecified: Secondary | ICD-10-CM | POA: Diagnosis not present

## 2014-04-08 DIAGNOSIS — E039 Hypothyroidism, unspecified: Secondary | ICD-10-CM | POA: Diagnosis not present

## 2014-04-08 NOTE — Progress Notes (Signed)
Cardiology Office Note  Date:  04/08/2014   ID:  [name], DOB 1932-07-11, MRN [id number]  PCP:  [name], MD  Cardiologist:  Sherren Mocha, MD    Chief Complaint  Patient presents with  . Atrial Fibrillation     History of Present Illness: [name] is a 81 y.o. male who presents for follow-up of congestive heart failure and aortic valve disease. He has coronary artery disease and initially was treated with CABG in 1979 and then had redo CABG in 1991. He's had a severe ischemic cardiomyopathy with LVEF 25%. He then underwent transcatheter aortic valve replacement at the [facility] clinic in 2012 for treatment of severe symptomatic aortic stenosis. He has had moderate perivalvular AI. The patient has permanent atrial fibrillation and is maintained on long-term anticoagulation. He's had myalgias related to statin drugs, but is tolerating low-dose atorvastatin at present.   The patient has suffered another compression fracture since I have seen him last. He's getting better but still having quite a bit of pain. His mobility is improving and he is now ambulating with a walker. From a cardiac perspective he is doing fine. He denies chest pain, shortness of breath at rest, orthopnea, PND, or leg swelling. He's had no lightheadedness or heart palpitations. He does have shortness of breath with exertion but this is unchanged.     Past Medical History  Diagnosis Date  . Ischemic cardiomyopathy 05/2010    Has EF of 25%  . Diverticulitis     CURRENTLY CONTROLLED WITH NO EVIDENCE OF RECURRENT INFECTION  . AF (atrial fibrillation)     Has not tolerated amiodarone in the past. Amiodarone was stopped in September of 2010 due to side effects  . Chronic anticoagulation     on coumadin  . Prostate cancer   . Osteoarthritis     RIGHT KNEE  . Aortic stenosis, severe     WITH PERCUTANEOUS AORTIC VALVE (TAVI) IN March 2012 at the [facility]  . MI (myocardial infarction) 1974, 1978   . Cervical myelopathy   . Pulmonary embolism   . Hyperlipidemia   . GERD (gastroesophageal reflux disease)   . Esophageal stricture     WITH DILATATION  . NSVT (nonsustained ventricular tachycardia)   . Coronary artery disease   . Stroke   . Heart murmur     Past Surgical History  Procedure Laterality Date  . Icd  Feb 2003; 02/2013    gen change 02-11-2013 by Dr Caryl Comes  . Coronary artery bypass graft  1979  . Coronary artery bypass graft  1991    REDO SURGERY  . Cardiac catheterization  2010    SEVERE LV DYSFUNCTION WITH ESTIMATED EJECTION FRACTION OF 25%  . Cholecystectomy    . Aortic valve replacement      Percutaneous AVR in March 2012 at [facility]  . Cardioversion  02/16/2011    Procedure: CARDIOVERSION;  Surgeon: Carlena Bjornstad, MD;  Location: [place];  Service: Cardiovascular;  Laterality: N/A;  . Shoulder surgery    . Back surgery    . Implantable cardioverter defibrillator (icd) generator change N/A 02/10/2013    Procedure: ICD GENERATOR CHANGE;  Surgeon: Deboraha Sprang, MD;  Location: [facility] CATH LAB;  Service: Cardiovascular;  Laterality: N/A;    Current Outpatient Prescriptions  Medication Sig Dispense Refill  . acetaminophen (TYLENOL) 500 MG tablet Take 1,000 mg by mouth every 6 (six) hours as needed.    Marland Kitchen alfuzosin (UROXATRAL) 10 MG  24 hr tablet Take 10 mg by mouth at bedtime.     Marland Kitchen atorvastatin (LIPITOR) 10 MG tablet Take 10 mg by mouth every other day.     . Budesonide-Formoterol Fumarate (SYMBICORT IN) Inhale 1 puff into the lungs 2 (two) times daily.    . carvedilol (COREG) 6.25 MG tablet TAKE 1 TABLET TWICE DAILY WITH A MEAL. 60 tablet 6  . HYDROcodone-acetaminophen (NORCO/VICODIN) 5-325 MG per tablet Take 1 tablet by mouth every 6 (six) hours as needed for moderate pain. 30 tablet 0  . ipratropium (ATROVENT) 0.03 % nasal spray Place 2 sprays into both nostrils 2 (two) times daily.    Marland Kitchen levothyroxine (SYNTHROID, LEVOTHROID) 150 MCG tablet Take 150 mcg by  mouth daily before breakfast.    . methocarbamol (ROBAXIN) 500 MG tablet Take 500 mg by mouth 3 (three) times daily as needed for muscle spasms.    . Multiple Vitamins-Minerals (MULTIVITAMIN PO) Take 1 tablet by mouth daily.    Marland Kitchen NITROSTAT 0.4 MG SL tablet DISSOLVE 1 TABLET UNDER TONGUE AS NEEDED FOR CHEST PAIN,MAY REPEAT IN5 MINUTES FOR 2 DOSES. 25 tablet 0  . omeprazole (PRILOSEC) 20 MG capsule Take 20 mg by mouth daily as needed (acid reflux).    . polyethylene glycol (MIRALAX / GLYCOLAX) packet Take 17 g by mouth daily as needed (constipation).    . RESTASIS 0.05 % ophthalmic emulsion Place 1 drop into both eyes every 12 (twelve) hours.     . traMADol (ULTRAM) 50 MG tablet Take 50 mg by mouth every 8 (eight) hours as needed for moderate pain.    Marland Kitchen warfarin (COUMADIN) 5 MG tablet Take 5 mg by mouth daily. 5MG  6 days a week, 2.5MG  once a week.    . warfarin (COUMADIN) 3 MG tablet Take 1 tablet (3 mg total) by mouth daily. (Patient not taking: Reported on 04/08/2014) 15 tablet 0   No current facility-administered medications for this visit.    Allergies:   Penicillins; Ace inhibitors; Antihistamines, diphenhydramine-type; and Amiodarone   Social History:  The patient  reports that he quit smoking about 44 years ago. His smoking use included Cigarettes. He has a 60 pack-year smoking history. He has never used smokeless tobacco. He reports that he does not drink alcohol or use illicit drugs.   Family History:  The patient's family history includes Diabetes (age of onset: 33) in his mother; Heart disease (age of onset: 27) in his mother; Heart disease (age of onset: 24) in his father.    ROS:  Please see the history of present illness.  Otherwise, review of systems is positive for cough, back pain, wheezing, constipation, gait instability.  All other systems are reviewed and negative.    PHYSICAL EXAM: VS:  BP 120/56 mmHg  Pulse 76  Ht 6\' 3"  (1.905 m)  Wt 189 lb (85.73 kg)  BMI 23.62 kg/m2   SpO2 96% , BMI Body mass index is 23.62 kg/(m^2). GEN: Well nourished, well developed, in no acute distress HEENT: normal Neck: no JVD, carotid bruits, or masses Cardiac: RRR with grade 2/6 diastolic decrescendo murmur at the left lower sternal border, unchanged from previous exams Respiratory:  clear to auscultation bilaterally, normal work of breathing GI: soft, nontender, nondistended, + BS MS: no deformity or atrophy Skin: warm and dry, no rash Neuro:  Strength and sensation are intact Psych: euthymic mood, full affect  EKG:  EKG is not ordered today.  Recent Labs: 01/16/2014: ALT 12 01/19/2014: BUN 13; Creatinine 1.01; Potassium  4.5; Sodium 131* 03/17/2014: Hemoglobin 13.6; Platelets 215.0   Lipid Panel     Component Value Date/Time   CHOL 142 08/12/2013 0914   TRIG 48.0 08/12/2013 0914   HDL 48.70 08/12/2013 0914   CHOLHDL 3 08/12/2013 0914   VLDL 9.6 08/12/2013 0914   LDLCALC 84 08/12/2013 0914      Wt Readings from Last 3 Encounters:  04/08/14 189 lb (85.73 kg)  03/09/14 186 lb 12.8 oz (84.732 kg)  01/17/14 186 lb 1.1 oz (84.4 kg)     Other studies Reviewed: 2-D Echo interpretation pending  ASSESSMENT AND PLAN: 1.  Aortic valve disorder status post TAVR. I have personally reviewed the patient's echo images from today's study. The formal interpretation is pending, but I do not appreciate any difference in his Arab valvular aortic insufficiency and would graded at 2+. LV dimensions are stable. He will continue his current medical program and I will see him back in 6 months next  2. CAD status post CABG. The patient is stable without symptoms of angina. No changes were made to his medical program. Next  3. Chronic systolic heart failure, New York Heart Association functional class II. When I saw him last, I tried him on losartan 25 mg. He was unable to tolerate this secondary to hypotension. He will continue on carvedilol.  4. Hyperlipidemia. He is tolerating  atorvastatin every other day. His most recent lipid panel was reviewed. Will repeat in 6 months when I see him back for follow-up.  5. Paroxysmal atrial fibrillation. He continues on warfarin.   Current medicines are reviewed with the patient today.  The patient does not have concerns regarding medicines.  The following changes have been made:  no change  Labs/ tests ordered today include: lipids and LFT's in 6 months.  Orders Placed This Encounter  Procedures  . Lipid panel  . Hepatic function panel   Disposition:   FU with me in 6 months.  Signed, Sherren Mocha, MD  04/08/2014 5:30 PM    [address], [place], [place]  [address] Phone: [phone]; Fax: [phone]

## 2014-04-08 NOTE — Patient Instructions (Signed)
Your physician recommends that you return for a FASTING LIPID and LIVER in 6 MONTHS--nothing to eat or drink after midnight, lab opens at 7:30 AM  Your physician wants you to follow-up in: 6 MONTHS with Dr Burt Knack.  You will receive a reminder letter in the mail two months in advance. If you don't receive a letter, please call our office to schedule the follow-up appointment.  Your physician recommends that you continue on your current medications as directed. Please refer to the Current Medication list given to you today.

## 2014-04-08 NOTE — Progress Notes (Signed)
Echocardiogram performed.  

## 2014-04-09 ENCOUNTER — Encounter (HOSPITAL_COMMUNITY): Payer: Self-pay

## 2014-04-09 DIAGNOSIS — M8580 Other specified disorders of bone density and structure, unspecified site: Secondary | ICD-10-CM | POA: Diagnosis not present

## 2014-04-09 DIAGNOSIS — S22080A Wedge compression fracture of T11-T12 vertebra, initial encounter for closed fracture: Secondary | ICD-10-CM | POA: Diagnosis not present

## 2014-04-10 ENCOUNTER — Ambulatory Visit (INDEPENDENT_AMBULATORY_CARE_PROVIDER_SITE_OTHER): Payer: Medicare Other | Admitting: Internal Medicine

## 2014-04-10 ENCOUNTER — Encounter (HOSPITAL_COMMUNITY): Payer: Self-pay

## 2014-04-10 DIAGNOSIS — I482 Chronic atrial fibrillation, unspecified: Secondary | ICD-10-CM

## 2014-04-10 DIAGNOSIS — Z5181 Encounter for therapeutic drug level monitoring: Secondary | ICD-10-CM

## 2014-04-10 LAB — POCT INR: INR: 2.2

## 2014-04-13 ENCOUNTER — Telehealth: Payer: Self-pay | Admitting: Cardiovascular Disease

## 2014-04-13 NOTE — Telephone Encounter (Signed)
New Message  Pt requested to speak w/ Rn about his Echo results/ Please call back and discuss.

## 2014-04-13 NOTE — Telephone Encounter (Signed)
I spoke with the pt and reviewed echo results with him.  I advised the pt that he can increase his activity but this should be done very slowly due to his risk for falls.  Pt agreed with plan.

## 2014-04-14 ENCOUNTER — Other Ambulatory Visit (HOSPITAL_COMMUNITY): Payer: Self-pay | Admitting: Sports Medicine

## 2014-04-14 DIAGNOSIS — R269 Unspecified abnormalities of gait and mobility: Secondary | ICD-10-CM | POA: Diagnosis not present

## 2014-04-14 DIAGNOSIS — I252 Old myocardial infarction: Secondary | ICD-10-CM | POA: Diagnosis not present

## 2014-04-14 DIAGNOSIS — R627 Adult failure to thrive: Secondary | ICD-10-CM | POA: Diagnosis not present

## 2014-04-14 DIAGNOSIS — E785 Hyperlipidemia, unspecified: Secondary | ICD-10-CM | POA: Diagnosis not present

## 2014-04-14 DIAGNOSIS — C61 Malignant neoplasm of prostate: Secondary | ICD-10-CM

## 2014-04-14 DIAGNOSIS — Z6823 Body mass index (BMI) 23.0-23.9, adult: Secondary | ICD-10-CM | POA: Diagnosis not present

## 2014-04-14 DIAGNOSIS — K5909 Other constipation: Secondary | ICD-10-CM | POA: Diagnosis not present

## 2014-04-14 DIAGNOSIS — K219 Gastro-esophageal reflux disease without esophagitis: Secondary | ICD-10-CM | POA: Diagnosis not present

## 2014-04-14 DIAGNOSIS — Z0000 Encounter for general adult medical examination without abnormal findings: Secondary | ICD-10-CM | POA: Diagnosis not present

## 2014-04-14 DIAGNOSIS — Z1389 Encounter for screening for other disorder: Secondary | ICD-10-CM | POA: Diagnosis not present

## 2014-04-14 DIAGNOSIS — Z95810 Presence of automatic (implantable) cardiac defibrillator: Secondary | ICD-10-CM | POA: Diagnosis not present

## 2014-04-14 DIAGNOSIS — R739 Hyperglycemia, unspecified: Secondary | ICD-10-CM | POA: Diagnosis not present

## 2014-04-14 DIAGNOSIS — M810 Age-related osteoporosis without current pathological fracture: Secondary | ICD-10-CM | POA: Diagnosis not present

## 2014-04-16 ENCOUNTER — Encounter: Payer: Self-pay | Admitting: Internal Medicine

## 2014-04-16 DIAGNOSIS — Z1212 Encounter for screening for malignant neoplasm of rectum: Secondary | ICD-10-CM | POA: Diagnosis not present

## 2014-04-22 ENCOUNTER — Encounter (HOSPITAL_COMMUNITY)
Admission: RE | Admit: 2014-04-22 | Discharge: 2014-04-22 | Disposition: A | Discharge status: Home / Self Care | Payer: Medicare Other | Source: Ambulatory Visit | Attending: Sports Medicine | Admitting: Sports Medicine

## 2014-04-22 DIAGNOSIS — Z8546 Personal history of malignant neoplasm of prostate: Secondary | ICD-10-CM | POA: Diagnosis not present

## 2014-04-22 DIAGNOSIS — C61 Malignant neoplasm of prostate: Secondary | ICD-10-CM | POA: Insufficient documentation

## 2014-04-22 MED ORDER — TECHNETIUM TC 99M MEDRONATE IV KIT
26.5000 | PACK | Freq: Once | INTRAVENOUS | Status: AC | PRN
  Administered 2014-04-22: 26.5 via INTRAVENOUS

## 2014-04-24 ENCOUNTER — Ambulatory Visit (HOSPITAL_COMMUNITY)
Admission: RE | Admit: 2014-04-24 | Discharge: 2014-04-24 | Disposition: A | Discharge status: Home / Self Care | Payer: Medicare Other | Source: Ambulatory Visit | Attending: Interventional Radiology | Admitting: Interventional Radiology

## 2014-04-24 DIAGNOSIS — M4856XA Collapsed vertebra, not elsewhere classified, lumbar region, initial encounter for fracture: Secondary | ICD-10-CM | POA: Diagnosis not present

## 2014-04-24 DIAGNOSIS — M810 Age-related osteoporosis without current pathological fracture: Secondary | ICD-10-CM | POA: Diagnosis not present

## 2014-04-24 DIAGNOSIS — IMO0002 Reserved for concepts with insufficient information to code with codable children: Secondary | ICD-10-CM

## 2014-04-24 DIAGNOSIS — M549 Dorsalgia, unspecified: Secondary | ICD-10-CM

## 2014-04-24 DIAGNOSIS — S22080D Wedge compression fracture of T11-T12 vertebra, subsequent encounter for fracture with routine healing: Secondary | ICD-10-CM | POA: Diagnosis not present

## 2014-04-24 DIAGNOSIS — M4854XA Collapsed vertebra, not elsewhere classified, thoracic region, initial encounter for fracture: Secondary | ICD-10-CM | POA: Diagnosis not present

## 2014-04-27 ENCOUNTER — Ambulatory Visit (INDEPENDENT_AMBULATORY_CARE_PROVIDER_SITE_OTHER): Payer: Medicare Other | Admitting: Cardiology

## 2014-04-27 DIAGNOSIS — I4891 Unspecified atrial fibrillation: Secondary | ICD-10-CM | POA: Diagnosis not present

## 2014-04-27 DIAGNOSIS — Z7901 Long term (current) use of anticoagulants: Secondary | ICD-10-CM | POA: Diagnosis not present

## 2014-04-27 DIAGNOSIS — Z5181 Encounter for therapeutic drug level monitoring: Secondary | ICD-10-CM

## 2014-04-27 DIAGNOSIS — I482 Chronic atrial fibrillation, unspecified: Secondary | ICD-10-CM

## 2014-04-27 LAB — POCT INR: INR: 2.2

## 2014-05-11 ENCOUNTER — Ambulatory Visit (INDEPENDENT_AMBULATORY_CARE_PROVIDER_SITE_OTHER): Payer: Medicare Other | Admitting: *Deleted

## 2014-05-11 ENCOUNTER — Ambulatory Visit (INDEPENDENT_AMBULATORY_CARE_PROVIDER_SITE_OTHER): Payer: Medicare Other | Admitting: Internal Medicine

## 2014-05-11 ENCOUNTER — Encounter: Payer: Self-pay | Admitting: *Deleted

## 2014-05-11 ENCOUNTER — Encounter: Payer: Self-pay | Admitting: Internal Medicine

## 2014-05-11 DIAGNOSIS — I482 Chronic atrial fibrillation, unspecified: Secondary | ICD-10-CM

## 2014-05-11 DIAGNOSIS — I255 Ischemic cardiomyopathy: Secondary | ICD-10-CM

## 2014-05-11 DIAGNOSIS — Z95810 Presence of automatic (implantable) cardiac defibrillator: Secondary | ICD-10-CM

## 2014-05-11 DIAGNOSIS — Z5181 Encounter for therapeutic drug level monitoring: Secondary | ICD-10-CM

## 2014-05-11 LAB — MDC_IDC_ENUM_SESS_TYPE_REMOTE
Battery Voltage: 3.03 V
Brady Statistic RV Percent Paced: 4.62 %
Date Time Interrogation Session: 20160229162818
HIGH POWER IMPEDANCE MEASURED VALUE: 40 Ohm
HighPow Impedance: 51 Ohm
Lead Channel Impedance Value: 551 Ohm
Lead Channel Pacing Threshold Amplitude: 1 V
Lead Channel Pacing Threshold Pulse Width: 0.4 ms
Lead Channel Sensing Intrinsic Amplitude: 16.4 mV
Lead Channel Setting Pacing Amplitude: 2 V
Lead Channel Setting Pacing Pulse Width: 0.4 ms
Lead Channel Setting Sensing Sensitivity: 0.3 mV
MDC IDC MSMT BATTERY REMAINING LONGEVITY: 130 mo
MDC IDC MSMT LEADCHNL RV IMPEDANCE VALUE: 418 Ohm
MDC IDC SET ZONE DETECTION INTERVAL: 400 ms
MDC IDC SET ZONE DETECTION INTERVAL: 450 ms
Zone Setting Detection Interval: 300 ms

## 2014-05-11 LAB — POCT INR: INR: 2.2

## 2014-05-11 NOTE — Progress Notes (Signed)
EPIC Encounter for ICM Monitoring  Patient Name: [name] is a 81 y.o. male Date: 05/11/2014 Primary Care Physican: [name], MD Primary Cardiologist: Burt Knack Electrophysiologist: Caryl Comes Dry Weight: 180 lbs       In the past month, have you:  1. Gained more than 2 pounds in a day or more than 5 pounds in a week? no  2. Had changes in your medications (with verification of current medications)? no  3. Had more shortness of breath than is usual for you? no  4. Limited your activity because of shortness of breath? no  5. Not been able to sleep because of shortness of breath? no  6. Had increased swelling in your feet or ankles? no  7. Had symptoms of dehydration (dizziness, dry mouth, increased thirst, decreased urine output) no  8. Had changes in sodium restriction? no  9. Been compliant with medication? Yes   ICM trend:   Follow-up plan: ICM clinic phone appointment: 06/11/14. The patient's optivol readings continue to show some elevation, but this is improving. The patient's appetite has continued to improve after his GI bleed and weight loss down to 170 lbs. He states that he feels good. Reviewed the above reading with Dr. Caryl Comes. No changes made at the present time. The patient did recently have a repeat echo that shows his EF to be 15-20%.  Copy of note sent to patient's primary care physician, primary cardiologist, and device following physician.  Alvis Lemmings, RN, BSN 05/11/2014 2:41 PM

## 2014-05-12 ENCOUNTER — Telehealth: Payer: Self-pay | Admitting: Cardiovascular Disease

## 2014-05-12 NOTE — Telephone Encounter (Signed)
New Msg      Caryl Pina from Interventional Radiology, office of Dr. Estanislado Pandy calling.   Please contact office in regards to pt having an upcoming procedure.

## 2014-05-12 NOTE — Telephone Encounter (Signed)
Left message on machine for Caryl Pina to contact the office.

## 2014-05-14 NOTE — Telephone Encounter (Signed)
F/U     [name] returning call.     Please call back to Interventional Radiology.

## 2014-05-19 ENCOUNTER — Telehealth: Payer: Self-pay | Admitting: Cardiovascular Disease

## 2014-05-19 NOTE — Telephone Encounter (Signed)
I spoke with the pt and he said that the physician is located at [facility].  They are recommending that the pt have a IV medication and he would like to get Dr Antionette Char recommendation about whether this is safe.  I made the pt aware that I will try to obtain more information tomorrow.

## 2014-05-19 NOTE — Telephone Encounter (Signed)
Pt wants to know if dr cooper has spoken with dr Elease Hashimoto, pls advise

## 2014-05-20 NOTE — Telephone Encounter (Signed)
I spoke with Caryl Pina in Interventional Radiology and Dr Estanislado Pandy would like to perform either a vertebroplasty or kyphoplasty on the pt.  Dr Estanislado Pandy would like the pt to be off of coumadin for this procedure and can perform procedure if the pt is on lovenox.  I spoke with the pt and he is very anxious about coming off of coumadin with his history of Stroke and PE. The pt would like to know Dr Antionette Char thoughts on this procedure and whether this would be safe from a heart stand point.

## 2014-05-20 NOTE — Telephone Encounter (Signed)
I think it's ok with a lovenox bridge

## 2014-05-21 NOTE — Telephone Encounter (Signed)
I spoke with the pt and he is very anxious about doing any procedure that requires him needing to come off of coumadin.  The pt is aware that he can have this procedure performed while on lovenox. The pt is currently in the process of receiving an IV treatment to improve his bone strength and would like to continue with this plan. I made the pt aware that if he decides to proceed with vertebroplasty or kyphoplasty at any time then we can revisit this issue.  Pt was appreciative of my call.

## 2014-05-21 NOTE — Telephone Encounter (Signed)
I have left a message at Dr Kathrin Penner office to inquire about the type of medication that has been recommended. I am awaiting a return phone call.

## 2014-05-21 NOTE — Telephone Encounter (Signed)
I spoke with the [name] and he states that the nurse at [facility] called him this morning and made him aware that the IV treatment recommended to improve his bone strength would not effect his INR or coumadin.  This was the [name]'s main concern and at this time he is planning to proceed on IV treatment. I will contact Raliegh Ip to discuss the medication being recommend (I think the [name] has seen Dr Layne Benton).

## 2014-05-22 DIAGNOSIS — M810 Age-related osteoporosis without current pathological fracture: Secondary | ICD-10-CM | POA: Diagnosis not present

## 2014-05-22 DIAGNOSIS — R5383 Other fatigue: Secondary | ICD-10-CM | POA: Diagnosis not present

## 2014-05-22 NOTE — Telephone Encounter (Signed)
Will forward to Theodosia Quay RN.

## 2014-05-22 NOTE — Telephone Encounter (Signed)
Follow Up  Dr/ Bassett's office returning Lauren's phone call. Please call back and discuss.

## 2014-05-25 ENCOUNTER — Ambulatory Visit (INDEPENDENT_AMBULATORY_CARE_PROVIDER_SITE_OTHER): Payer: Medicare Other | Admitting: Internal Medicine

## 2014-05-25 DIAGNOSIS — Z5181 Encounter for therapeutic drug level monitoring: Secondary | ICD-10-CM

## 2014-05-25 DIAGNOSIS — I482 Chronic atrial fibrillation, unspecified: Secondary | ICD-10-CM

## 2014-05-25 LAB — POCT INR: INR: 2.8

## 2014-05-26 NOTE — Telephone Encounter (Signed)
I spoke with [name] and she said the pt is scheduled for Reclast infusion. Lavona Mound states that Dr Layne Benton has already received approval for the pt to receive this treatment from our office. The pt's main concern was whether this would effect his INR.  Discussed with Elberta Leatherwood Pharm-D and no interaction between Coumadin and Reclast.

## 2014-06-01 DIAGNOSIS — N411 Chronic prostatitis: Secondary | ICD-10-CM | POA: Diagnosis not present

## 2014-06-01 DIAGNOSIS — Z880 Allergy status to penicillin: Secondary | ICD-10-CM | POA: Diagnosis not present

## 2014-06-01 DIAGNOSIS — R300 Dysuria: Secondary | ICD-10-CM | POA: Diagnosis not present

## 2014-06-01 DIAGNOSIS — I4891 Unspecified atrial fibrillation: Secondary | ICD-10-CM | POA: Diagnosis not present

## 2014-06-01 DIAGNOSIS — C61 Malignant neoplasm of prostate: Secondary | ICD-10-CM | POA: Diagnosis not present

## 2014-06-01 DIAGNOSIS — N320 Bladder-neck obstruction: Secondary | ICD-10-CM | POA: Diagnosis not present

## 2014-06-01 DIAGNOSIS — R3989 Other symptoms and signs involving the genitourinary system: Secondary | ICD-10-CM | POA: Diagnosis not present

## 2014-06-01 DIAGNOSIS — I350 Nonrheumatic aortic (valve) stenosis: Secondary | ICD-10-CM | POA: Diagnosis not present

## 2014-06-08 ENCOUNTER — Ambulatory Visit (INDEPENDENT_AMBULATORY_CARE_PROVIDER_SITE_OTHER): Payer: Medicare Other | Admitting: Internal Medicine

## 2014-06-08 DIAGNOSIS — Z5181 Encounter for therapeutic drug level monitoring: Secondary | ICD-10-CM

## 2014-06-08 DIAGNOSIS — I482 Chronic atrial fibrillation, unspecified: Secondary | ICD-10-CM

## 2014-06-08 DIAGNOSIS — I4891 Unspecified atrial fibrillation: Secondary | ICD-10-CM | POA: Diagnosis not present

## 2014-06-08 DIAGNOSIS — Z7901 Long term (current) use of anticoagulants: Secondary | ICD-10-CM | POA: Diagnosis not present

## 2014-06-08 LAB — POCT INR: INR: 1.8

## 2014-06-11 ENCOUNTER — Ambulatory Visit (INDEPENDENT_AMBULATORY_CARE_PROVIDER_SITE_OTHER): Payer: Medicare Other | Admitting: *Deleted

## 2014-06-11 DIAGNOSIS — Z95810 Presence of automatic (implantable) cardiac defibrillator: Secondary | ICD-10-CM

## 2014-06-11 DIAGNOSIS — I255 Ischemic cardiomyopathy: Secondary | ICD-10-CM | POA: Diagnosis not present

## 2014-06-11 NOTE — Progress Notes (Signed)
Remote ICD transmission.   

## 2014-06-12 ENCOUNTER — Encounter: Payer: Self-pay | Admitting: *Deleted

## 2014-06-12 NOTE — Progress Notes (Signed)
EPIC Encounter for ICM Monitoring  Patient Name: [name] is a 81 y.o. male Date: 06/12/2014 Primary Care Physican: [name], MD Primary Cardiologist: Mare Ferrari Electrophysiologist: Caryl Comes Dry Weight: 180 lbs       In the past month, have you:  1. Gained more than 2 pounds in a day or more than 5 pounds in a week? No. He was 181 lbs today.   2. Had changes in your medications (with verification of current medications)? Yes. He has started Calcium one tablet BID and Vit D one tablet BID.  3. Had more shortness of breath than is usual for you? no  4. Limited your activity because of shortness of breath? no  5. Not been able to sleep because of shortness of breath? no  6. Had increased swelling in your feet or ankles? no  7. Had symptoms of dehydration (dizziness, dry mouth, increased thirst, decreased urine output) no  8. Had changes in sodium restriction? no  9. Been compliant with medication? Yes   ICM trend:   Follow-up plan: ICM clinic phone appointment: 07/16/14. The patient's impedence has continue to gradually approach baseline. He is symptom free at this time. He reports today that since his weight has gradually come back up to 180 lbs (was down in the 170's when he was sick), his SBP has come up from the 80-90 range and is now running about 111-115. He feels much better with his pressures slightly higher. He is scheduled for a Reclast injection on Monday.  No changes made today.   Copy of note sent to patient's primary care physician, primary cardiologist, and device following physician.  Alvis Lemmings, RN, BSN 06/12/2014 9:10 AM

## 2014-06-12 NOTE — Addendum Note (Signed)
Addended by: Alvis Lemmings C on: 06/12/2014 09:17 AM   Modules accepted: Level of Service

## 2014-06-15 ENCOUNTER — Other Ambulatory Visit (HOSPITAL_COMMUNITY): Payer: Self-pay | Admitting: Sports Medicine

## 2014-06-15 ENCOUNTER — Ambulatory Visit (INDEPENDENT_AMBULATORY_CARE_PROVIDER_SITE_OTHER): Payer: Medicare Other | Admitting: Cardiology

## 2014-06-15 ENCOUNTER — Ambulatory Visit (HOSPITAL_COMMUNITY)
Admission: RE | Admit: 2014-06-15 | Discharge: 2014-06-15 | Disposition: A | Discharge status: Home / Self Care | Payer: Medicare Other | Source: Ambulatory Visit | Attending: Sports Medicine | Admitting: Sports Medicine

## 2014-06-15 ENCOUNTER — Encounter (HOSPITAL_COMMUNITY): Payer: Self-pay

## 2014-06-15 DIAGNOSIS — M810 Age-related osteoporosis without current pathological fracture: Secondary | ICD-10-CM | POA: Insufficient documentation

## 2014-06-15 DIAGNOSIS — I482 Chronic atrial fibrillation, unspecified: Secondary | ICD-10-CM

## 2014-06-15 DIAGNOSIS — Z5181 Encounter for therapeutic drug level monitoring: Secondary | ICD-10-CM

## 2014-06-15 HISTORY — DX: Age-related osteoporosis without current pathological fracture: M81.0

## 2014-06-15 LAB — POCT INR: INR: 2.3

## 2014-06-15 MED ORDER — SODIUM CHLORIDE 0.9 % IV SOLN
Freq: Once | INTRAVENOUS | Status: AC
  Administered 2014-06-15: 14:00:00 via INTRAVENOUS

## 2014-06-15 MED ORDER — ZOLEDRONIC ACID 5 MG/100ML IV SOLN
5.0000 mg | Freq: Once | INTRAVENOUS | Status: AC
  Administered 2014-06-15: 5 mg via INTRAVENOUS
  Filled 2014-06-15: qty 100

## 2014-06-15 NOTE — Discharge Instructions (Signed)
Drink fluids/water as tolerated over next 72hrs °Tylenol or Ibuprofen OTC as directed °Continue calcium and Vit D as directed by your MDZoledronic Acid injection (Paget's Disease, Osteoporosis) °What is this medicine? °ZOLEDRONIC ACID (ZOE le dron ik AS id) lowers the amount of calcium loss from bone. It is used to treat Paget's disease and osteoporosis in women. °This medicine may be used for other purposes; ask your health care provider or pharmacist if you have questions. °COMMON BRAND NAME(S): Reclast, Zometa °What should I tell my health care provider before I take this medicine? °They need to know if you have any of these conditions: °-aspirin-sensitive asthma °-cancer, especially if you are receiving medicines used to treat cancer °-dental disease or wear dentures °-infection °-kidney disease °-low levels of calcium in the blood °-past surgery on the parathyroid gland or intestines °-receiving corticosteroids like dexamethasone or prednisone °-an unusual or allergic reaction to zoledronic acid, other medicines, foods, dyes, or preservatives °-pregnant or trying to get pregnant °-breast-feeding °How should I use this medicine? °This medicine is for infusion into a vein. It is given by a health care professional in a hospital or clinic setting. °Talk to your pediatrician regarding the use of this medicine in children. This medicine is not approved for use in children. °Overdosage: If you think you have taken too much of this medicine contact a poison control center or emergency room at once. °NOTE: This medicine is only for you. Do not share this medicine with others. °What if I miss a dose? °It is important not to miss your dose. Call your doctor or health care professional if you are unable to keep an appointment. °What may interact with this medicine? °-certain antibiotics given by injection °-NSAIDs, medicines for pain and inflammation, like ibuprofen or naproxen °-some diuretics like bumetanide,  furosemide °-teriparatide °This list may not describe all possible interactions. Give your health care provider a list of all the medicines, herbs, non-prescription drugs, or dietary supplements you use. Also tell them if you smoke, drink alcohol, or use illegal drugs. Some items may interact with your medicine. °What should I watch for while using this medicine? °Visit your doctor or health care professional for regular checkups. It may be some time before you see the benefit from this medicine. Do not stop taking your medicine unless your doctor tells you to. Your doctor may order blood tests or other tests to see how you are doing. °Women should inform their doctor if they wish to become pregnant or think they might be pregnant. There is a potential for serious side effects to an unborn child. Talk to your health care professional or pharmacist for more information. °You should make sure that you get enough calcium and vitamin D while you are taking this medicine. Discuss the foods you eat and the vitamins you take with your health care professional. °Some people who take this medicine have severe bone, joint, and/or muscle pain. This medicine may also increase your risk for jaw problems or a broken thigh bone. Tell your doctor right away if you have severe pain in your jaw, bones, joints, or muscles. Tell your doctor if you have any pain that does not go away or that gets worse. °Tell your dentist and dental surgeon that you are taking this medicine. You should not have major dental surgery while on this medicine. See your dentist to have a dental exam and fix any dental problems before starting this medicine. Take good care of your teeth while on   this medicine. Make sure you see your dentist for regular follow-up appointments. °What side effects may I notice from receiving this medicine? °Side effects that you should report to your doctor or health care professional as soon as possible: °-allergic reactions  like skin rash, itching or hives, swelling of the face, lips, or tongue °-anxiety, confusion, or depression °-breathing problems °-changes in vision °-eye pain °-feeling faint or lightheaded, falls °-jaw pain, especially after dental work °-mouth sores °-muscle cramps, stiffness, or weakness °-trouble passing urine or change in the amount of urine °Side effects that usually do not require medical attention (report to your doctor or health care professional if they continue or are bothersome): °-bone, joint, or muscle pain °-constipation °-diarrhea °-fever °-hair loss °-irritation at site where injected °-loss of appetite °-nausea, vomiting °-stomach upset °-trouble sleeping °-trouble swallowing °-weak or tired °This list may not describe all possible side effects. Call your doctor for medical advice about side effects. You may report side effects to FDA at 1-800-FDA-1088. °Where should I keep my medicine? °This drug is given in a hospital or clinic and will not be stored at home. °NOTE: This sheet is a summary. It may not cover all possible information. If you have questions about this medicine, talk to your doctor, pharmacist, or health care provider. °© 2015, [employer]. (2012-08-12 10:03:48) ° °

## 2014-06-16 ENCOUNTER — Other Ambulatory Visit: Payer: Self-pay | Admitting: Cardiovascular Disease

## 2014-06-17 ENCOUNTER — Encounter: Payer: Self-pay | Admitting: Cardiology

## 2014-06-18 DIAGNOSIS — H6123 Impacted cerumen, bilateral: Secondary | ICD-10-CM | POA: Diagnosis not present

## 2014-06-29 ENCOUNTER — Ambulatory Visit (INDEPENDENT_AMBULATORY_CARE_PROVIDER_SITE_OTHER): Payer: Medicare Other | Admitting: Internal Medicine

## 2014-06-29 ENCOUNTER — Telehealth: Payer: Self-pay

## 2014-06-29 DIAGNOSIS — Z5181 Encounter for therapeutic drug level monitoring: Secondary | ICD-10-CM

## 2014-06-29 DIAGNOSIS — I482 Chronic atrial fibrillation, unspecified: Secondary | ICD-10-CM

## 2014-06-29 LAB — POCT INR: INR: 3

## 2014-06-29 NOTE — Telephone Encounter (Signed)
Pt stated his INR today (06/29/2014) is 3.0 and should he stay on same dose? Call back number [phone]

## 2014-06-29 NOTE — Telephone Encounter (Signed)
See pt coumadin encounter sheet

## 2014-07-01 DIAGNOSIS — S22080D Wedge compression fracture of T11-T12 vertebra, subsequent encounter for fracture with routine healing: Secondary | ICD-10-CM | POA: Diagnosis not present

## 2014-07-01 DIAGNOSIS — M810 Age-related osteoporosis without current pathological fracture: Secondary | ICD-10-CM | POA: Diagnosis not present

## 2014-07-03 DIAGNOSIS — N411 Chronic prostatitis: Secondary | ICD-10-CM | POA: Diagnosis not present

## 2014-07-03 DIAGNOSIS — C61 Malignant neoplasm of prostate: Secondary | ICD-10-CM | POA: Diagnosis not present

## 2014-07-13 ENCOUNTER — Ambulatory Visit (INDEPENDENT_AMBULATORY_CARE_PROVIDER_SITE_OTHER): Payer: Medicare Other | Admitting: Internal Medicine

## 2014-07-13 DIAGNOSIS — I482 Chronic atrial fibrillation, unspecified: Secondary | ICD-10-CM

## 2014-07-13 DIAGNOSIS — Z5181 Encounter for therapeutic drug level monitoring: Secondary | ICD-10-CM

## 2014-07-13 LAB — POCT INR: INR: 2.9

## 2014-07-16 ENCOUNTER — Ambulatory Visit (INDEPENDENT_AMBULATORY_CARE_PROVIDER_SITE_OTHER): Payer: Medicare Other | Admitting: *Deleted

## 2014-07-16 DIAGNOSIS — Z95810 Presence of automatic (implantable) cardiac defibrillator: Secondary | ICD-10-CM | POA: Diagnosis not present

## 2014-07-16 DIAGNOSIS — I255 Ischemic cardiomyopathy: Secondary | ICD-10-CM

## 2014-07-20 ENCOUNTER — Encounter: Payer: Self-pay | Admitting: *Deleted

## 2014-07-20 NOTE — Progress Notes (Signed)
EPIC Encounter for ICM Monitoring  Patient Name: [name] is a 81 y.o. male Date: 07/20/2014 Primary Care Physican: [name], MD Primary Cardiologist: Burt Knack Electrophysiologist: Caryl Comes Dry Weight: 180 lbs       In the past month, have you:  1. Gained more than 2 pounds in a day or more than 5 pounds in a week? no  2. Had changes in your medications (with verification of current medications)? no  3. Had more shortness of breath than is usual for you? no  4. Limited your activity because of shortness of breath? no  5. Not been able to sleep because of shortness of breath? no  6. Had increased swelling in your feet or ankles? no  7. Had symptoms of dehydration (dizziness, dry mouth, increased thirst, decreased urine output) no  8. Had changes in sodium restriction? no  9. Been compliant with medication? Yes   ICM trend:   Follow-up plan: ICM clinic phone appointment: 08/20/14. No changes made today.  Copy of note sent to patient's primary care physician, primary cardiologist, and device following physician.  Alvis Lemmings, RN, BSN 07/20/2014 2:22 PM

## 2014-07-21 DIAGNOSIS — H35372 Puckering of macula, left eye: Secondary | ICD-10-CM | POA: Diagnosis not present

## 2014-07-22 DIAGNOSIS — M545 Low back pain: Secondary | ICD-10-CM | POA: Diagnosis not present

## 2014-07-22 DIAGNOSIS — M546 Pain in thoracic spine: Secondary | ICD-10-CM | POA: Diagnosis not present

## 2014-07-27 ENCOUNTER — Ambulatory Visit (INDEPENDENT_AMBULATORY_CARE_PROVIDER_SITE_OTHER): Payer: Medicare Other | Admitting: Cardiovascular Disease

## 2014-07-27 DIAGNOSIS — Z7901 Long term (current) use of anticoagulants: Secondary | ICD-10-CM | POA: Diagnosis not present

## 2014-07-27 DIAGNOSIS — Z5181 Encounter for therapeutic drug level monitoring: Secondary | ICD-10-CM

## 2014-07-27 DIAGNOSIS — I482 Chronic atrial fibrillation, unspecified: Secondary | ICD-10-CM

## 2014-07-27 DIAGNOSIS — I4891 Unspecified atrial fibrillation: Secondary | ICD-10-CM | POA: Diagnosis not present

## 2014-07-27 LAB — POCT INR: INR: 3.1

## 2014-07-28 ENCOUNTER — Other Ambulatory Visit: Payer: Self-pay | Admitting: Cardiovascular Disease

## 2014-08-06 DIAGNOSIS — R05 Cough: Secondary | ICD-10-CM | POA: Diagnosis not present

## 2014-08-06 DIAGNOSIS — R490 Dysphonia: Secondary | ICD-10-CM | POA: Diagnosis not present

## 2014-08-06 DIAGNOSIS — J069 Acute upper respiratory infection, unspecified: Secondary | ICD-10-CM | POA: Diagnosis not present

## 2014-08-11 ENCOUNTER — Ambulatory Visit (INDEPENDENT_AMBULATORY_CARE_PROVIDER_SITE_OTHER): Payer: Medicare Other | Admitting: Cardiology

## 2014-08-11 DIAGNOSIS — I482 Chronic atrial fibrillation, unspecified: Secondary | ICD-10-CM

## 2014-08-11 DIAGNOSIS — Z5181 Encounter for therapeutic drug level monitoring: Secondary | ICD-10-CM

## 2014-08-11 LAB — POCT INR: INR: 3.1

## 2014-08-13 DIAGNOSIS — E559 Vitamin D deficiency, unspecified: Secondary | ICD-10-CM | POA: Diagnosis not present

## 2014-08-13 DIAGNOSIS — E039 Hypothyroidism, unspecified: Secondary | ICD-10-CM | POA: Diagnosis not present

## 2014-08-13 DIAGNOSIS — I2510 Atherosclerotic heart disease of native coronary artery without angina pectoris: Secondary | ICD-10-CM | POA: Diagnosis not present

## 2014-08-13 DIAGNOSIS — Z7901 Long term (current) use of anticoagulants: Secondary | ICD-10-CM | POA: Diagnosis not present

## 2014-08-13 DIAGNOSIS — R739 Hyperglycemia, unspecified: Secondary | ICD-10-CM | POA: Diagnosis not present

## 2014-08-13 DIAGNOSIS — M810 Age-related osteoporosis without current pathological fracture: Secondary | ICD-10-CM | POA: Diagnosis not present

## 2014-08-13 DIAGNOSIS — Z6823 Body mass index (BMI) 23.0-23.9, adult: Secondary | ICD-10-CM | POA: Diagnosis not present

## 2014-08-13 DIAGNOSIS — R269 Unspecified abnormalities of gait and mobility: Secondary | ICD-10-CM | POA: Diagnosis not present

## 2014-08-13 DIAGNOSIS — D692 Other nonthrombocytopenic purpura: Secondary | ICD-10-CM | POA: Diagnosis not present

## 2014-08-13 DIAGNOSIS — E785 Hyperlipidemia, unspecified: Secondary | ICD-10-CM | POA: Diagnosis not present

## 2014-08-13 DIAGNOSIS — R627 Adult failure to thrive: Secondary | ICD-10-CM | POA: Diagnosis not present

## 2014-08-18 ENCOUNTER — Ambulatory Visit (INDEPENDENT_AMBULATORY_CARE_PROVIDER_SITE_OTHER): Payer: Medicare Other | Admitting: Internal Medicine

## 2014-08-18 DIAGNOSIS — Z5181 Encounter for therapeutic drug level monitoring: Secondary | ICD-10-CM

## 2014-08-18 DIAGNOSIS — I482 Chronic atrial fibrillation, unspecified: Secondary | ICD-10-CM

## 2014-08-18 LAB — POCT INR: INR: 2.6

## 2014-08-20 ENCOUNTER — Encounter: Payer: Self-pay | Admitting: *Deleted

## 2014-08-20 ENCOUNTER — Ambulatory Visit (INDEPENDENT_AMBULATORY_CARE_PROVIDER_SITE_OTHER): Payer: Medicare Other | Admitting: *Deleted

## 2014-08-20 DIAGNOSIS — Z95810 Presence of automatic (implantable) cardiac defibrillator: Secondary | ICD-10-CM | POA: Diagnosis not present

## 2014-08-20 DIAGNOSIS — I255 Ischemic cardiomyopathy: Secondary | ICD-10-CM | POA: Diagnosis not present

## 2014-08-20 NOTE — Progress Notes (Signed)
EPIC Encounter for ICM Monitoring  Patient Name: [name] is a 81 y.o. male Date: 08/20/2014 Primary Care Physican: [name], MD Primary Cardiologist: Burt Knack Electrophysiologist: Caryl Comes Dry Weight: 180 lbs       In the past month, have you:  1. Gained more than 2 pounds in a day or more than 5 pounds in a week? no  2. Had changes in your medications (with verification of current medications)? no  3. Had more shortness of breath than is usual for you? no  4. Limited your activity because of shortness of breath? no  5. Not been able to sleep because of shortness of breath? no  6. Had increased swelling in your feet or ankles? no  7. Had symptoms of dehydration (dizziness, dry mouth, increased thirst, decreased urine output) no  8. Had changes in sodium restriction? no  9. Been compliant with medication? Yes   ICM trend:   Follow-up plan: ICM clinic phone appointment: 09/21/14. No changes made today.  Copy of note sent to patient's primary care physician, primary cardiologist, and device following physician.  Alvis Lemmings, RN, BSN 08/20/2014 5:24 PM

## 2014-08-26 ENCOUNTER — Other Ambulatory Visit: Payer: Self-pay | Admitting: Cardiovascular Disease

## 2014-08-31 ENCOUNTER — Other Ambulatory Visit: Payer: Self-pay | Admitting: Cardiology

## 2014-08-31 DIAGNOSIS — R490 Dysphonia: Secondary | ICD-10-CM | POA: Diagnosis not present

## 2014-08-31 DIAGNOSIS — J029 Acute pharyngitis, unspecified: Secondary | ICD-10-CM | POA: Diagnosis not present

## 2014-09-01 ENCOUNTER — Ambulatory Visit (INDEPENDENT_AMBULATORY_CARE_PROVIDER_SITE_OTHER): Payer: Medicare Other | Admitting: Cardiovascular Disease

## 2014-09-01 DIAGNOSIS — I482 Chronic atrial fibrillation, unspecified: Secondary | ICD-10-CM

## 2014-09-01 DIAGNOSIS — Z7901 Long term (current) use of anticoagulants: Secondary | ICD-10-CM | POA: Diagnosis not present

## 2014-09-01 DIAGNOSIS — Z5181 Encounter for therapeutic drug level monitoring: Secondary | ICD-10-CM

## 2014-09-01 DIAGNOSIS — I4891 Unspecified atrial fibrillation: Secondary | ICD-10-CM | POA: Diagnosis not present

## 2014-09-01 LAB — POCT INR: INR: 2.5

## 2014-09-01 NOTE — Telephone Encounter (Signed)
Patient sees Dr Burt Knack AS PRIMARY CARDIOLOGIST

## 2014-09-15 ENCOUNTER — Ambulatory Visit (INDEPENDENT_AMBULATORY_CARE_PROVIDER_SITE_OTHER): Payer: Medicare Other | Admitting: Internal Medicine

## 2014-09-15 DIAGNOSIS — I482 Chronic atrial fibrillation, unspecified: Secondary | ICD-10-CM

## 2014-09-15 DIAGNOSIS — Z5181 Encounter for therapeutic drug level monitoring: Secondary | ICD-10-CM

## 2014-09-15 LAB — POCT INR: INR: 2.5

## 2014-09-21 ENCOUNTER — Ambulatory Visit (INDEPENDENT_AMBULATORY_CARE_PROVIDER_SITE_OTHER): Payer: Medicare Other | Admitting: *Deleted

## 2014-09-21 ENCOUNTER — Other Ambulatory Visit: Payer: Self-pay | Admitting: Cardiovascular Disease

## 2014-09-21 DIAGNOSIS — I255 Ischemic cardiomyopathy: Secondary | ICD-10-CM

## 2014-09-21 DIAGNOSIS — Z95810 Presence of automatic (implantable) cardiac defibrillator: Secondary | ICD-10-CM

## 2014-09-22 ENCOUNTER — Encounter: Payer: Self-pay | Admitting: *Deleted

## 2014-09-22 NOTE — Progress Notes (Signed)
EPIC Encounter for ICM Monitoring  Patient Name: [name] is a 81 y.o. male Date: 09/22/2014 Primary Care Physican: [name], MD Primary Cardiologist: Burt Knack Electrophysiologist: Caryl Comes Dry Weight: 180 lbs       In the past month, have you:  1. Gained more than 2 pounds in a day or more than 5 pounds in a week? no  2. Had changes in your medications (with verification of current medications)? The patient reports that he took a 10 day course of antibiotics for an URI, but is off of this currently.  3. Had more shortness of breath than is usual for you? no  4. Limited your activity because of shortness of breath? no  5. Not been able to sleep because of shortness of breath? no  6. Had increased swelling in your feet or ankles? no  7. Had symptoms of dehydration (dizziness, dry mouth, increased thirst, decreased urine output) no  8. Had changes in sodium restriction? no  9. Been compliant with medication? Yes   ICM trend:   Follow-up plan: ICM clinic phone appointment: 10/26/14. No changes made today.  Copy of note sent to patient's primary care physician, primary cardiologist, and device following physician.  Alvis Lemmings, RN, BSN 09/22/2014 2:17 PM

## 2014-09-23 LAB — CUP PACEART REMOTE DEVICE CHECK
Brady Statistic RV Percent Paced: 6.53 %
Date Time Interrogation Session: 20160711052403
HighPow Impedance: 40 Ohm
HighPow Impedance: 50 Ohm
Lead Channel Impedance Value: 551 Ohm
Lead Channel Pacing Threshold Amplitude: 0.875 V
Lead Channel Pacing Threshold Pulse Width: 0.4 ms
Lead Channel Setting Pacing Amplitude: 2 V
Lead Channel Setting Pacing Pulse Width: 0.4 ms
Lead Channel Setting Sensing Sensitivity: 0.3 mV
MDC IDC MSMT BATTERY REMAINING LONGEVITY: 127 mo
MDC IDC MSMT BATTERY VOLTAGE: 3.03 V
MDC IDC MSMT LEADCHNL RV IMPEDANCE VALUE: 418 Ohm
MDC IDC MSMT LEADCHNL RV SENSING INTR AMPL: 15.875 mV
MDC IDC MSMT LEADCHNL RV SENSING INTR AMPL: 15.875 mV
MDC IDC SET ZONE DETECTION INTERVAL: 400 ms
Zone Setting Detection Interval: 300 ms
Zone Setting Detection Interval: 450 ms

## 2014-09-30 ENCOUNTER — Ambulatory Visit (INDEPENDENT_AMBULATORY_CARE_PROVIDER_SITE_OTHER): Payer: Medicare Other | Admitting: Pharmacist

## 2014-09-30 DIAGNOSIS — I482 Chronic atrial fibrillation, unspecified: Secondary | ICD-10-CM

## 2014-09-30 DIAGNOSIS — Z5181 Encounter for therapeutic drug level monitoring: Secondary | ICD-10-CM

## 2014-09-30 LAB — POCT INR: INR: 2.4

## 2014-10-05 ENCOUNTER — Encounter: Payer: Self-pay | Admitting: Cardiovascular Disease

## 2014-10-05 NOTE — Telephone Encounter (Signed)
This encounter was created in error - please disregard.

## 2014-10-05 NOTE — Telephone Encounter (Signed)
New message     Calling to see if you have received physician form that was sent on July 19th? Please call to discuss

## 2014-10-06 ENCOUNTER — Other Ambulatory Visit (INDEPENDENT_AMBULATORY_CARE_PROVIDER_SITE_OTHER): Payer: Medicare Other | Admitting: *Deleted

## 2014-10-06 DIAGNOSIS — E785 Hyperlipidemia, unspecified: Secondary | ICD-10-CM

## 2014-10-06 LAB — HEPATIC FUNCTION PANEL
ALBUMIN: 3.9 g/dL (ref 3.5–5.2)
ALT: 15 U/L (ref 0–53)
AST: 19 U/L (ref 0–37)
Alkaline Phosphatase: 48 U/L (ref 39–117)
BILIRUBIN DIRECT: 0.2 mg/dL (ref 0.0–0.3)
Total Bilirubin: 0.7 mg/dL (ref 0.2–1.2)
Total Protein: 6.4 g/dL (ref 6.0–8.3)

## 2014-10-06 LAB — LIPID PANEL
CHOL/HDL RATIO: 3
CHOLESTEROL: 157 mg/dL (ref 0–200)
HDL: 49.5 mg/dL (ref >39.00)
LDL CALC: 92 mg/dL (ref 0–99)
NonHDL: 107.5
Triglycerides: 76 mg/dL (ref 0.0–149.0)
VLDL: 15.2 mg/dL (ref 0.0–40.0)

## 2014-10-14 ENCOUNTER — Ambulatory Visit (INDEPENDENT_AMBULATORY_CARE_PROVIDER_SITE_OTHER): Payer: Medicare Other | Admitting: Internal Medicine

## 2014-10-14 DIAGNOSIS — Z5181 Encounter for therapeutic drug level monitoring: Secondary | ICD-10-CM

## 2014-10-14 DIAGNOSIS — I482 Chronic atrial fibrillation, unspecified: Secondary | ICD-10-CM

## 2014-10-14 LAB — POCT INR: INR: 2.9

## 2014-10-16 ENCOUNTER — Encounter: Payer: Self-pay | Admitting: Cardiology

## 2014-10-23 ENCOUNTER — Encounter: Payer: Self-pay | Admitting: Internal Medicine

## 2014-10-26 ENCOUNTER — Ambulatory Visit (INDEPENDENT_AMBULATORY_CARE_PROVIDER_SITE_OTHER): Payer: Medicare Other | Admitting: *Deleted

## 2014-10-26 DIAGNOSIS — Z95810 Presence of automatic (implantable) cardiac defibrillator: Secondary | ICD-10-CM

## 2014-10-26 DIAGNOSIS — I255 Ischemic cardiomyopathy: Secondary | ICD-10-CM

## 2014-10-28 ENCOUNTER — Encounter: Payer: Self-pay | Admitting: Cardiology

## 2014-10-28 ENCOUNTER — Ambulatory Visit (INDEPENDENT_AMBULATORY_CARE_PROVIDER_SITE_OTHER): Payer: Medicare Other | Admitting: Pharmacist

## 2014-10-28 DIAGNOSIS — Z5181 Encounter for therapeutic drug level monitoring: Secondary | ICD-10-CM

## 2014-10-28 DIAGNOSIS — I482 Chronic atrial fibrillation, unspecified: Secondary | ICD-10-CM

## 2014-10-28 DIAGNOSIS — I4891 Unspecified atrial fibrillation: Secondary | ICD-10-CM | POA: Diagnosis not present

## 2014-10-28 DIAGNOSIS — Z7901 Long term (current) use of anticoagulants: Secondary | ICD-10-CM | POA: Diagnosis not present

## 2014-10-28 LAB — POCT INR
INR: 3
INR: 3

## 2014-10-28 NOTE — Progress Notes (Signed)
This encounter was created in error - please disregard.

## 2014-10-28 NOTE — Progress Notes (Signed)
EPIC Encounter for ICM Monitoring  Patient Name: [name] is a 81 y.o. male Date: 10/28/2014 Primary Care Physican: [name], MD Primary Cardiologist: Burt Knack Electrophysiologist: Caryl Comes Dry Weight: 181.4 lb       In the past month, have you:  1. Gained more than 2 pounds in a day or more than 5 pounds in a week? no  2. Had changes in your medications (with verification of current medications)? no  3. Had more shortness of breath than is usual for you? no  4. Limited your activity because of shortness of breath? no  5. Not been able to sleep because of shortness of breath? no  6. Had increased swelling in your feet or ankles? no  7. Had symptoms of dehydration (dizziness, dry mouth, increased thirst, decreased urine output) no  8. Had changes in sodium restriction? no  9. Been compliant with medication? Yes   ICM trend:  Follow-up plan: ICM clinic phone appointment 11/30/2014.  Patient having back pain from broken vertebrae and using walker.  No changes today.   Copy of note sent to patient's primary care physician, primary cardiologist, and device following physician.  Rosalene Billings, RN, CCM 10/28/2014 9:59 AM

## 2014-11-05 DIAGNOSIS — K5900 Constipation, unspecified: Secondary | ICD-10-CM | POA: Diagnosis not present

## 2014-11-12 ENCOUNTER — Ambulatory Visit (INDEPENDENT_AMBULATORY_CARE_PROVIDER_SITE_OTHER): Payer: Medicare Other | Admitting: Internal Medicine

## 2014-11-12 DIAGNOSIS — I482 Chronic atrial fibrillation, unspecified: Secondary | ICD-10-CM

## 2014-11-12 DIAGNOSIS — Z5181 Encounter for therapeutic drug level monitoring: Secondary | ICD-10-CM

## 2014-11-12 LAB — POCT INR: INR: 2.4

## 2014-11-18 ENCOUNTER — Ambulatory Visit (INDEPENDENT_AMBULATORY_CARE_PROVIDER_SITE_OTHER): Payer: Medicare Other | Admitting: Cardiovascular Disease

## 2014-11-18 ENCOUNTER — Encounter: Payer: Self-pay | Admitting: Cardiovascular Disease

## 2014-11-18 VITALS — BP 122/70 | Ht 75.0 in | Wt 193.0 lb

## 2014-11-18 DIAGNOSIS — I480 Paroxysmal atrial fibrillation: Secondary | ICD-10-CM

## 2014-11-18 DIAGNOSIS — I255 Ischemic cardiomyopathy: Secondary | ICD-10-CM

## 2014-11-18 DIAGNOSIS — I5022 Chronic systolic (congestive) heart failure: Secondary | ICD-10-CM | POA: Diagnosis not present

## 2014-11-18 DIAGNOSIS — I359 Nonrheumatic aortic valve disorder, unspecified: Secondary | ICD-10-CM | POA: Diagnosis not present

## 2014-11-18 NOTE — Patient Instructions (Addendum)
Medication Instructions:  Your physician recommends that you continue on your current medications as directed. Please refer to the Current Medication list given to you today.   Labwork: none  Testing/Procedures: Your physician has requested that you have an echocardiogram. Echocardiography is a painless test that uses sound waves to create images of your heart. It provides your doctor with information about the size and shape of your heart and how well your heart's chambers and valves are working. This procedure takes approximately one hour. There are no restrictions for this procedure. To be done in January 2017.     Follow-Up: Your physician wants you to follow-up in: January 2017--week or so after echo.  You will receive a reminder letter in the mail two months in advance. If you don't receive a letter, please call our office to schedule the follow-up appointment.

## 2014-11-18 NOTE — Progress Notes (Signed)
Cardiology Office Note Date:  11/18/2014   ID:  [name], DOB 02/02/1933, MRN [id number]  PCP:  Jeffrey Reel, MD  Cardiologist:  Sherren Mocha, MD    Chief Complaint  Patient presents with  . Follow-up     History of Present Illness: [name] is a 81 y.o. male who presents for follow-up of congestive heart failure and aortic valve disease. He has coronary artery disease and initially was treated with CABG in 1979 and then had redo CABG in 1991. He's had a severe ischemic cardiomyopathy with LVEF 25%. He then underwent transcatheter aortic valve replacement at the [facility] clinic in 2012 for treatment of severe symptomatic aortic stenosis. He has had moderate perivalvular AI. The patient has permanent atrial fibrillation and is maintained on long-term anticoagulation. He's had myalgias related to statin drugs, but is tolerating low-dose atorvastatin at present.   He continues to have a lot of problems with his back, which is his primary limitation with respect to physical activity. He ambulates with a walker when he out of the house, uses a cane inside the home.   He has no cardiac complaints today. He specifically denies chest pain, chest pressure, shortness of breath, heart palpitations, orthopnea, or PND. He's had no bleeding problems on warfarin. He has considered undergoing a back procedure that would require him to interrupt warfarin. However, he is concerned about his risk of stroke considering his past history.  Past Medical History  Diagnosis Date  . Ischemic cardiomyopathy 05/2010    Has EF of 25%  . Diverticulitis     CURRENTLY CONTROLLED WITH NO EVIDENCE OF RECURRENT INFECTION  . AF (atrial fibrillation)     Has not tolerated amiodarone in the past. Amiodarone was stopped in September of 2010 due to side effects  . Chronic anticoagulation     on coumadin  . Prostate cancer   . Osteoarthritis     RIGHT KNEE  . Aortic stenosis, severe     WITH PERCUTANEOUS  AORTIC VALVE (TAVI) IN March 2012 at the [facility]  . MI (myocardial infarction) 1974, 1978  . Cervical myelopathy   . Pulmonary embolism   . Hyperlipidemia   . GERD (gastroesophageal reflux disease)   . Esophageal stricture     WITH DILATATION  . NSVT (nonsustained ventricular tachycardia)   . Coronary artery disease   . Stroke   . Heart murmur   . Osteoporosis     Past Surgical History  Procedure Laterality Date  . Icd  Feb 2003; 02/2013    gen change 02-11-2013 by Dr Caryl Comes  . Coronary artery bypass graft  1979  . Coronary artery bypass graft  1991    REDO SURGERY  . Cardiac catheterization  2010    SEVERE LV DYSFUNCTION WITH ESTIMATED EJECTION FRACTION OF 25%  . Cholecystectomy    . Aortic valve replacement      Percutaneous AVR in March 2012 at the [facility]  . Cardioversion  02/16/2011    Procedure: CARDIOVERSION;  Surgeon: Carlena Bjornstad, MD;  Location: [facility];  Service: Cardiovascular;  Laterality: N/A;  . Shoulder surgery    . Back surgery    . Implantable cardioverter defibrillator (icd) generator change N/A 02/10/2013    Procedure: ICD GENERATOR CHANGE;  Surgeon: Deboraha Sprang, MD;  Location: [facility] CATH LAB;  Service: Cardiovascular;  Laterality: N/A;    Current Outpatient Prescriptions  Medication Sig Dispense Refill  . cholecalciferol (VITAMIN D) 1000 UNITS tablet Take  1,000 Units by mouth 2 (two) times daily.    Marland Kitchen COUMADIN 5 MG tablet TAKE AS DIRECTED BY COUMADIN CLINIC. 90 tablet 0  . HYDROcodone-acetaminophen (NORCO/VICODIN) 5-325 MG per tablet Take 1 tablet by mouth every 6 (six) hours as needed for moderate pain. 30 tablet 0  . ipratropium (ATROVENT) 0.03 % nasal spray Place 2 sprays into both nostrils 2 (two) times daily.    Marland Kitchen levothyroxine (SYNTHROID, LEVOTHROID) 150 MCG tablet Take 150 mcg by mouth daily before breakfast.    . methocarbamol (ROBAXIN) 500 MG tablet Take 500 mg by mouth 3 (three) times daily as needed for muscle spasms.    .  Multiple Vitamins-Minerals (MULTIVITAMIN PO) Take 1 tablet by mouth daily.    Marland Kitchen NITROSTAT 0.4 MG SL tablet DISSOLVE 1 TABLET UNDER TONGUE AS NEEDED FOR CHEST PAIN,MAY REPEAT IN5 MINUTES FOR 2 DOSES. 25 tablet 0  . omeprazole (PRILOSEC) 20 MG capsule Take 20 mg by mouth daily as needed (acid reflux).    . polyethylene glycol (MIRALAX / GLYCOLAX) packet Take 17 g by mouth daily as needed (constipation).    . RESTASIS 0.05 % ophthalmic emulsion Place 1 drop into both eyes every 12 (twelve) hours.     . traMADol (ULTRAM) 50 MG tablet Take 50 mg by mouth every 8 (eight) hours as needed for moderate pain.    Marland Kitchen warfarin (COUMADIN) 3 MG tablet Take 1 tablet (3 mg total) by mouth daily. 15 tablet 0  . acetaminophen (TYLENOL) 500 MG tablet Take 1,000 mg by mouth every 6 (six) hours as needed.    Marland Kitchen alfuzosin (UROXATRAL) 10 MG 24 hr tablet Take 10 mg by mouth at bedtime.     Marland Kitchen atorvastatin (LIPITOR) 10 MG tablet TAKE ONE TABLET EVERY OTHER DAY. 45 tablet 0  . Budesonide-Formoterol Fumarate (SYMBICORT IN) Inhale 1 puff into the lungs 2 (two) times daily.    . calcium carbonate (OS-CAL) 600 MG TABS tablet Take 600 mg by mouth 2 (two) times daily with a meal. 600 +D    . carvedilol (COREG) 6.25 MG tablet TAKE 1 TABLET TWICE DAILY WITH A MEAL. 60 tablet 6   No current facility-administered medications for this visit.    Allergies:   Penicillins; Ace inhibitors; Antihistamines, diphenhydramine-type; and Amiodarone   Social History:  The patient  reports that he quit smoking about 45 years ago. His smoking use included Cigarettes. He has a 60 pack-year smoking history. He has never used smokeless tobacco. He reports that he does not drink alcohol or use illicit drugs.   Family History:  The patient's  family history includes Diabetes (age of onset: 72) in his mother; Heart disease (age of onset: 46) in his mother; Heart disease (age of onset: 39) in his father.    ROS:  Please see the history of present  illness.  Otherwise, review of systems is positive for cough, back pain, muscle pain, easy bruising, wheezing, constipation, balance problems.  All other systems are reviewed and negative.    PHYSICAL EXAM: VS:  BP 122/70 mmHg  Ht 6\' 3"  (1.905 m)  Wt 193 lb (87.544 kg)  BMI 24.12 kg/m2 , BMI Body mass index is 24.12 kg/(m^2). GEN: Well nourished, well developed, pleasant elderly male in no acute distress HEENT: normal Neck: no JVD, no masses. No carotid bruits Cardiac: Irregularly irregular with 2/6 systolic murmur at the RUSB and faint diastolic murmur at the LLSB  Respiratory:  clear to auscultation bilaterally, normal work of breathing GI: soft, nontender, nondistended, + BS MS: no deformity or atrophy Ext: no pretibial edema, pedal pulses 2+= bilaterally Skin: warm and dry, no rash Neuro:  Strength and sensation are intact Psych: euthymic mood, full affect  EKG:  EKG is ordered today. The ekg ordered today shows atrial fibrillation 67 bpm, rightward axis, age-indeterminate anterior infarct  Recent Labs: 01/19/2014: BUN 13; Creatinine, Ser 1.01; Potassium 4.5; Sodium 131* 03/17/2014: Hemoglobin 13.6; Platelets 215.0 10/06/2014: ALT 15   Lipid Panel     Component Value Date/Time   CHOL 157 10/06/2014 1143   TRIG 76.0 10/06/2014 1143   HDL 49.50 10/06/2014 1143   CHOLHDL 3 10/06/2014 1143   VLDL 15.2 10/06/2014 1143   LDLCALC 92 10/06/2014 1143      Wt Readings from Last 3 Encounters:  11/18/14 193 lb (87.544 kg)  06/15/14 180 lb (81.647 kg)  04/08/14 189 lb (85.73 kg)     Cardiac Studies Reviewed: 2-D echocardiogram 04/08/2014: Study Conclusions  - Left ventricle: The cavity size was mildly dilated. Wall thickness was normal. Systolic function was severely reduced. The estimated ejection fraction was in the range of 15% to 20%. Severe diffuse hypokinesis with distinct regional wall motion abnormalities. The apical and mid septum are mildly  dyskinetic. Akinesis and scarring of the entireinferior myocardium; consistent with infarction. The study is not technically sufficient to allow evaluation of LV diastolic function. - Aortic valve: There was mild-moderate perivalvular regurgitation. - Left atrium: The atrium was moderately to severely dilated. - Right ventricle: Systolic function was mildly reduced.  Impressions:  - Direct image comparison shows little change in LV function/wall motion or the severity of perivalvular aortic regurgitatiojn compared to September 2015.  ASSESSMENT AND PLAN: 1.  Chronic systolic heart failure, New York Heart Association functional class II: The patient reports minimal cardiac symptoms and is primarily limited by his back problems. He does have severe left ventricular dysfunction. He has not tolerated an aggressive heart failure regimen because of low blood pressure. His blood pressure today is much higher than his normal baseline. He will continue on carvedilol. Will repeat an echocardiogram in January of next year or 1 year follow-up. I will see him back after his echo is completed.  2. Aortic valve disease status post TAVR with mild to moderate paravalvular regurgitation. Exam suggests no significant change. Repeat echo is planned in January. The patient is anticoagulated with warfarin.  3. Atrial fibrillation: Heart rate is controlled. Continue warfarin. Past history of amiodarone intolerance. Advised that he would be a candidate for Lovenox bridging if he requires interruption of Coumadin for any reason in the future.  4. Hyperlipidemia: Recent lipids reviewed and he is at goal on atorvastatin every other day.  5. CAD status post CABG: No anginal symptoms on current regimen.  Current medicines are reviewed with the patient today.  The patient does not have concerns regarding medicines.  Labs/ tests ordered today include:  No orders of the defined types were placed in this  encounter.    Disposition:   FU 6 months  Signed, Sherren Mocha, MD  11/18/2014 8:49 AM    [facility] [place], [place], [place]  [address] Phone: [phone]; Fax: [phone]

## 2014-11-25 ENCOUNTER — Emergency Department (HOSPITAL_COMMUNITY)
Admission: EM | Admit: 2014-11-25 | Discharge: 2014-11-25 | Disposition: A | Discharge status: Home / Self Care | Payer: Medicare Other | Attending: Emergency Medicine | Admitting: Emergency Medicine

## 2014-11-25 ENCOUNTER — Emergency Department (HOSPITAL_COMMUNITY): Payer: Medicare Other

## 2014-11-25 ENCOUNTER — Encounter (HOSPITAL_COMMUNITY): Payer: Self-pay

## 2014-11-25 DIAGNOSIS — I4891 Unspecified atrial fibrillation: Secondary | ICD-10-CM | POA: Insufficient documentation

## 2014-11-25 DIAGNOSIS — M810 Age-related osteoporosis without current pathological fracture: Secondary | ICD-10-CM | POA: Insufficient documentation

## 2014-11-25 DIAGNOSIS — Z8546 Personal history of malignant neoplasm of prostate: Secondary | ICD-10-CM | POA: Insufficient documentation

## 2014-11-25 DIAGNOSIS — Z9889 Other specified postprocedural states: Secondary | ICD-10-CM | POA: Diagnosis not present

## 2014-11-25 DIAGNOSIS — R609 Edema, unspecified: Secondary | ICD-10-CM | POA: Diagnosis not present

## 2014-11-25 DIAGNOSIS — Z880 Allergy status to penicillin: Secondary | ICD-10-CM | POA: Insufficient documentation

## 2014-11-25 DIAGNOSIS — E785 Hyperlipidemia, unspecified: Secondary | ICD-10-CM | POA: Diagnosis not present

## 2014-11-25 DIAGNOSIS — Z7901 Long term (current) use of anticoagulants: Secondary | ICD-10-CM | POA: Diagnosis not present

## 2014-11-25 DIAGNOSIS — I252 Old myocardial infarction: Secondary | ICD-10-CM | POA: Diagnosis not present

## 2014-11-25 DIAGNOSIS — Z86711 Personal history of pulmonary embolism: Secondary | ICD-10-CM | POA: Diagnosis not present

## 2014-11-25 DIAGNOSIS — Z79899 Other long term (current) drug therapy: Secondary | ICD-10-CM | POA: Diagnosis not present

## 2014-11-25 DIAGNOSIS — Z7951 Long term (current) use of inhaled steroids: Secondary | ICD-10-CM | POA: Diagnosis not present

## 2014-11-25 DIAGNOSIS — I2510 Atherosclerotic heart disease of native coronary artery without angina pectoris: Secondary | ICD-10-CM | POA: Diagnosis not present

## 2014-11-25 DIAGNOSIS — R1032 Left lower quadrant pain: Secondary | ICD-10-CM | POA: Diagnosis present

## 2014-11-25 DIAGNOSIS — R011 Cardiac murmur, unspecified: Secondary | ICD-10-CM | POA: Diagnosis not present

## 2014-11-25 DIAGNOSIS — Z87891 Personal history of nicotine dependence: Secondary | ICD-10-CM | POA: Insufficient documentation

## 2014-11-25 DIAGNOSIS — Z9049 Acquired absence of other specified parts of digestive tract: Secondary | ICD-10-CM | POA: Diagnosis not present

## 2014-11-25 DIAGNOSIS — Z951 Presence of aortocoronary bypass graft: Secondary | ICD-10-CM | POA: Insufficient documentation

## 2014-11-25 DIAGNOSIS — M1990 Unspecified osteoarthritis, unspecified site: Secondary | ICD-10-CM | POA: Diagnosis not present

## 2014-11-25 DIAGNOSIS — K219 Gastro-esophageal reflux disease without esophagitis: Secondary | ICD-10-CM | POA: Diagnosis not present

## 2014-11-25 DIAGNOSIS — Z8673 Personal history of transient ischemic attack (TIA), and cerebral infarction without residual deficits: Secondary | ICD-10-CM | POA: Diagnosis not present

## 2014-11-25 DIAGNOSIS — R109 Unspecified abdominal pain: Secondary | ICD-10-CM | POA: Diagnosis not present

## 2014-11-25 DIAGNOSIS — K5900 Constipation, unspecified: Secondary | ICD-10-CM | POA: Insufficient documentation

## 2014-11-25 LAB — COMPREHENSIVE METABOLIC PANEL
ALK PHOS: 49 U/L (ref 38–126)
ALT: 16 U/L — ABNORMAL LOW (ref 17–63)
ANION GAP: 8 (ref 5–15)
AST: 22 U/L (ref 15–41)
Albumin: 3.6 g/dL (ref 3.5–5.0)
BUN: 8 mg/dL (ref 6–20)
CALCIUM: 8.8 mg/dL — AB (ref 8.9–10.3)
CHLORIDE: 98 mmol/L — AB (ref 101–111)
CO2: 22 mmol/L (ref 22–32)
Creatinine, Ser: 0.96 mg/dL (ref 0.61–1.24)
GFR calc Af Amer: >60 mL/min (ref >60)
GFR calc non Af Amer: >60 mL/min (ref >60)
Glucose, Bld: 109 mg/dL — ABNORMAL HIGH (ref 65–99)
Potassium: 4.3 mmol/L (ref 3.5–5.1)
Sodium: 128 mmol/L — ABNORMAL LOW (ref 135–145)
Total Bilirubin: 0.8 mg/dL (ref 0.3–1.2)
Total Protein: 5.9 g/dL — ABNORMAL LOW (ref 6.5–8.1)

## 2014-11-25 LAB — URINALYSIS, ROUTINE W REFLEX MICROSCOPIC
Bilirubin Urine: NEGATIVE
GLUCOSE, UA: NEGATIVE mg/dL
Hgb urine dipstick: NEGATIVE
Ketones, ur: NEGATIVE mg/dL
Leukocytes, UA: NEGATIVE
Nitrite: NEGATIVE
Protein, ur: NEGATIVE mg/dL
Specific Gravity, Urine: 1.017 (ref 1.005–1.030)
Urobilinogen, UA: 0.2 mg/dL (ref 0.0–1.0)
pH: 6 (ref 5.0–8.0)

## 2014-11-25 LAB — CBC
HCT: 40.6 % (ref 39.0–52.0)
Hemoglobin: 13.8 g/dL (ref 13.0–17.0)
MCH: 31.3 pg (ref 26.0–34.0)
MCHC: 34 g/dL (ref 30.0–36.0)
MCV: 92.1 fL (ref 78.0–100.0)
Platelets: 161 10*3/uL (ref 150–400)
RBC: 4.41 MIL/uL (ref 4.22–5.81)
RDW: 13.6 % (ref 11.5–15.5)
WBC: 6.1 10*3/uL (ref 4.0–10.5)

## 2014-11-25 LAB — LIPASE, BLOOD: LIPASE: 42 U/L (ref 22–51)

## 2014-11-25 LAB — PROTIME-INR
INR: 3.01 — AB (ref 0.00–1.49)
PROTHROMBIN TIME: 30.7 s — AB (ref 11.6–15.2)

## 2014-11-25 MED ORDER — ONDANSETRON HCL 4 MG/2ML IJ SOLN
4.0000 mg | Freq: Once | INTRAMUSCULAR | Status: AC
  Administered 2014-11-25: 4 mg via INTRAVENOUS
  Filled 2014-11-25: qty 2

## 2014-11-25 MED ORDER — POLYETHYLENE GLYCOL 3350 17 G PO PACK
17.0000 g | PACK | Freq: Every day | ORAL | Status: DC
  Filled 2014-11-25: qty 1

## 2014-11-25 MED ORDER — SODIUM CHLORIDE 0.9 % IV SOLN
Freq: Once | INTRAVENOUS | Status: AC
  Administered 2014-11-25: 05:00:00 via INTRAVENOUS

## 2014-11-25 MED ORDER — POLYETHYLENE GLYCOL 3350 17 G PO PACK: 17.0000 g | PACK | Freq: Every day | ORAL | Status: DC | PRN

## 2014-11-25 MED ORDER — IOHEXOL 300 MG/ML  SOLN
100.0000 mL | Freq: Once | INTRAMUSCULAR | Status: AC | PRN
  Administered 2014-11-25: 100 mL via INTRAVENOUS

## 2014-11-25 NOTE — ED Notes (Signed)
Returned from CT.

## 2014-11-25 NOTE — ED Provider Notes (Signed)
CSN: [id number]     Arrival date & time 11/25/14  8099 History   First MD Initiated Contact with Patient 11/25/14 [phone]     Chief Complaint  Patient presents with  . Abdominal Pain  . Rectal Pain  . Nausea     (Consider location/radiation/quality/duration/timing/severity/associated sxs/prior Treatment) HPI   [name] is a 81 y.o. male with past medical history of atrial fibrillation currently on warfarin, diverticulitis, presenting today with mild abdominal pain. Patient states he has left lower quadrant abdominal pain along with some rectal pain when straining on the toilet. He states this is consistent with his prior episodes of diverticulitis. He denies any blood in his stool. He has nausea which was relieved with ginger ale. He denies any fevers or recent infections. He's had no changes in his medications. Nothing has made his symptoms worse during the interval. He has no further complaints.  10 Systems reviewed and are negative for acute change except as noted in the HPI.    Past Medical History  Diagnosis Date  . Ischemic cardiomyopathy 05/2010    Has EF of 25%  . Diverticulitis     CURRENTLY CONTROLLED WITH NO EVIDENCE OF RECURRENT INFECTION  . AF (atrial fibrillation)     Has not tolerated amiodarone in the past. Amiodarone was stopped in September of 2010 due to side effects  . Chronic anticoagulation     on coumadin  . Prostate cancer   . Osteoarthritis     RIGHT KNEE  . Aortic stenosis, severe     WITH PERCUTANEOUS AORTIC VALVE (TAVI) IN March 2012 at the [facility]  . MI (myocardial infarction) 1974, 1978  . Cervical myelopathy   . Pulmonary embolism   . Hyperlipidemia   . GERD (gastroesophageal reflux disease)   . Esophageal stricture     WITH DILATATION  . NSVT (nonsustained ventricular tachycardia)   . Coronary artery disease   . Stroke   . Heart murmur   . Osteoporosis    Past Surgical History  Procedure Laterality Date  . Icd  Feb 2003;  02/2013    gen change 02-11-2013 by Dr Caryl Comes  . Coronary artery bypass graft  1979  . Coronary artery bypass graft  1991    REDO SURGERY  . Cardiac catheterization  2010    SEVERE LV DYSFUNCTION WITH ESTIMATED EJECTION FRACTION OF 25%  . Cholecystectomy    . Aortic valve replacement      Percutaneous AVR in March 2012 at the [facility]  . Cardioversion  02/16/2011    Procedure: CARDIOVERSION;  Surgeon: Carlena Bjornstad, MD;  Location: [place];  Service: Cardiovascular;  Laterality: N/A;  . Shoulder surgery    . Back surgery    . Implantable cardioverter defibrillator (icd) generator change N/A 02/10/2013    Procedure: ICD GENERATOR CHANGE;  Surgeon: Deboraha Sprang, MD;  Location: [facility] CATH LAB;  Service: Cardiovascular;  Laterality: N/A;   Family History  Problem Relation Age of Onset  . Heart disease Mother 33  . Diabetes Mother 24  . Heart disease Father 29   Social History  Substance Use Topics  . Smoking status: Former Smoker -- 4.00 packs/day for 15 years    Types: Cigarettes    Quit date: 06/15/1969  . Smokeless tobacco: Never Used  . Alcohol Use: No    Review of Systems    Allergies  Penicillins; Ace inhibitors; Antihistamines, diphenhydramine-type; and Amiodarone  Home Medications   Prior to Admission medications  Medication Sig Start Date End Date Taking? Authorizing Provider  acetaminophen (TYLENOL) 500 MG tablet Take 1,000 mg by mouth every 6 (six) hours as needed.   Yes Historical Provider, MD  alfuzosin (UROXATRAL) 10 MG 24 hr tablet Take 10 mg by mouth at bedtime.    Yes Historical Provider, MD  atorvastatin (LIPITOR) 10 MG tablet TAKE ONE TABLET EVERY OTHER DAY. 07/28/14  Yes Sherren Mocha, MD  Budesonide-Formoterol Fumarate (SYMBICORT IN) Inhale 1 puff into the lungs 2 (two) times daily.   Yes Historical Provider, MD  calcium carbonate (OS-CAL) 600 MG TABS tablet Take 600 mg by mouth 2 (two) times daily with a meal. 600 +D   Yes Historical Provider, MD   carvedilol (COREG) 6.25 MG tablet TAKE 1 TABLET TWICE DAILY WITH A MEAL. 09/01/14  Yes Lelon Perla, MD  cholecalciferol (VITAMIN D) 1000 UNITS tablet Take 1,000 Units by mouth 2 (two) times daily.   Yes Historical Provider, MD  ipratropium (ATROVENT) 0.03 % nasal spray Place 2 sprays into both nostrils 2 (two) times daily.   Yes Historical Provider, MD  levothyroxine (SYNTHROID, LEVOTHROID) 150 MCG tablet Take 150 mcg by mouth daily before breakfast.   Yes Historical Provider, MD  methocarbamol (ROBAXIN) 500 MG tablet Take 500 mg by mouth 3 (three) times daily as needed for muscle spasms.   Yes Historical Provider, MD  Multiple Vitamins-Minerals (MULTIVITAMIN PO) Take 1 tablet by mouth daily.   Yes Historical Provider, MD  omeprazole (PRILOSEC) 20 MG capsule Take 20 mg by mouth daily as needed (acid reflux).   Yes Historical Provider, MD  polyethylene glycol (MIRALAX / GLYCOLAX) packet Take 17 g by mouth daily as needed (constipation).   Yes Historical Provider, MD  Probiotic Product (PROBIOTIC DAILY PO) Take 1 capsule by mouth daily.   Yes Historical Provider, MD  RESTASIS 0.05 % ophthalmic emulsion Place 1 drop into both eyes every 12 (twelve) hours.  12/05/11  Yes Historical Provider, MD  warfarin (COUMADIN) 3 MG tablet Take 1 tablet (3 mg total) by mouth daily. 01/20/14  Yes Geradine Girt, DO  COUMADIN 5 MG tablet TAKE AS DIRECTED BY COUMADIN CLINIC. 09/21/14   Sherren Mocha, MD  HYDROcodone-acetaminophen (NORCO/VICODIN) 5-325 MG per tablet Take 1 tablet by mouth every 6 (six) hours as needed for moderate pain. 01/20/14   Jessica U Vann, DO  NITROSTAT 0.4 MG SL tablet DISSOLVE 1 TABLET UNDER TONGUE AS NEEDED FOR CHEST PAIN,MAY REPEAT IN5 MINUTES FOR 2 DOSES. 08/26/14   Sherren Mocha, MD   BP 124/74 mmHg  Pulse 63  Temp(Src) 97.4 F (36.3 C) (Oral)  Resp 20  Ht 6\' 3"  (1.905 m)  Wt 181 lb (82.101 kg)  BMI 22.62 kg/m2  SpO2 100% Physical Exam  Constitutional: He is oriented to person,  place, and time. Vital signs are normal. He appears well-developed and well-nourished.  Non-toxic appearance. He does not appear ill. No distress.  HENT:  Head: Normocephalic and atraumatic.  Nose: Nose normal.  Mouth/Throat: Oropharynx is clear and moist. No oropharyngeal exudate.  Eyes: Conjunctivae and EOM are normal. Pupils are equal, round, and reactive to light. No scleral icterus.  Neck: Normal range of motion. Neck supple. No tracheal deviation, no edema, no erythema and normal range of motion present. No thyroid mass and no thyromegaly present.  Cardiovascular: Normal rate, regular rhythm, S1 normal, S2 normal, normal heart sounds, intact distal pulses and normal pulses.  Exam reveals no gallop and no friction rub.   No murmur  heard. Pulses:      Radial pulses are 2+ on the right side, and 2+ on the left side.       Dorsalis pedis pulses are 2+ on the right side, and 2+ on the left side.  Pulmonary/Chest: Effort normal and breath sounds normal. No respiratory distress. He has no wheezes. He has no rhonchi. He has no rales.  Abdominal: Soft. Normal appearance and bowel sounds are normal. He exhibits no distension, no ascites and no mass. There is no hepatosplenomegaly. There is tenderness. There is no rebound, no guarding and no CVA tenderness.  LLQ tenderness to palpation.  Musculoskeletal: Normal range of motion. He exhibits edema. He exhibits no tenderness.  Lymphadenopathy:    He has no cervical adenopathy.  Neurological: He is alert and oriented to person, place, and time. He has normal strength. No cranial nerve deficit or sensory deficit.  Skin: Skin is warm, dry and intact. No petechiae and no rash noted. He is not diaphoretic. No erythema. No pallor.  Psychiatric: He has a normal mood and affect. His behavior is normal. Judgment normal.  Nursing note and vitals reviewed.   ED Course  Procedures (including critical care time) Labs Review Labs Reviewed  COMPREHENSIVE  METABOLIC PANEL - Abnormal; Notable for the following:    Sodium 128 (*)    Chloride 98 (*)    Glucose, Bld 109 (*)    Calcium 8.8 (*)    Total Protein 5.9 (*)    ALT 16 (*)    All other components within normal limits  PROTIME-INR - Abnormal; Notable for the following:    Prothrombin Time 30.7 (*)    INR 3.01 (*)    All other components within normal limits  LIPASE, BLOOD  CBC  URINALYSIS, ROUTINE W REFLEX MICROSCOPIC (NOT AT [facility])    Imaging Review Ct Abdomen Pelvis W Contrast  11/25/2014   CLINICAL DATA:  Bilateral lower quadrant abdominal pain. History of diverticulitis. Recent onset of diarrhea.  EXAM: CT ABDOMEN AND PELVIS WITH CONTRAST  TECHNIQUE: Multidetector CT imaging of the abdomen and pelvis was performed using the standard protocol following bolus administration of intravenous contrast.  CONTRAST:  13mL OMNIPAQUE IOHEXOL 300 MG/ML  SOLN  COMPARISON:  CT 03/19/2009  FINDINGS: Post aortic valve replacement with mild cardiomegaly. Pacemaker wires partially included. Subpleural reticulation in the right middle lobe. Scattered atelectasis in both lower lobes.  Postcholecystectomy with clips in the gallbladder fossa. No biliary dilatation. There is no focal hepatic lesion. The spleen, pancreas, and adrenal glands are normal. Symmetric renal enhancement and excretion without hydronephrosis. There is a 1.7 cm cyst in the upper left kidney.  The stomach is physiologically distended. Small hiatal hernia. Small bowel is normally opacified. There is distal colonic diverticulosis, no diverticulitis. No colonic wall thickening. Moderate stool burden throughout the proximal colon. The appendix is normal. No free air, free fluid, or intra-abdominal fluid collection.  No retroperitoneal adenopathy. Abdominal aorta is normal in caliber. There is dense atherosclerosis of the abdominal aorta and its branches.  Within the pelvis the urinary bladder is physiologically distended. Brachytherapy seeds in  the prostate gland. No pelvic or inguinal adenopathy. There is fat in the left inguinal canal.  Chronic compression deformities of T12, L1, and L4. Diffuse degenerative change in the lumbar spine. There are no acute or suspicious osseous abnormalities.  IMPRESSION: 1. No acute abnormality in the abdomen/pelvis 2. Diverticulosis without diverticulitis. Moderate stool burden in the proximal colon. 3. Chronic incidental findings include atherosclerosis,  left renal cyst, and postcholecystectomy change.   Electronically Signed   By: Jeb Levering M.D.   On: 11/25/2014 05:53   I have personally reviewed and evaluated these images and lab results as part of my medical decision-making.   EKG Interpretation None      MDM   Final diagnoses:  None   patient presents emergency department for left lower quadrant abdominal pain. He has a history of diverticulitis and that is certainly a possibility today. Will obtain CT scan of the abdomen and pelvis for evaluation. Patient was also given IV fluids.  INR today is 3.01, just barely above is recommended range, I do not believe any changes are necessary.  CT scan shows diverticulosis without any diverticulitis. He also has a moderate stool burden, this may be the cause of his symptoms. He was given a dose of Maalox emergency department will be given a prescription to use as needed. Patient is advised to follow-up as primary care physician within 3 days. Saw signs were within his normal limits and he is safe for discharge.    Everlene Balls, MD 11/25/14 [phone]

## 2014-11-25 NOTE — Discharge Instructions (Signed)
Constipation [name], your CT scan today did not show any diverticulitis. It does show stool in your colon, take Maalox as needed for constipation. See a primary care physician within 3 days for close follow-up. If symptoms worsen come back to emergency department immediately. Thank you. Constipation is when a person:  Poops (has a bowel movement) less than 3 times a week.  Has a hard time pooping.  Has poop that is dry, hard, or bigger than normal. HOME CARE   Eat foods with a lot of fiber in them. This includes fruits, vegetables, beans, and whole grains such as brown rice.  Avoid fatty foods and foods with a lot of sugar. This includes french fries, hamburgers, cookies, candy, and soda.  If you are not getting enough fiber from food, take products with added fiber in them (supplements).  Drink enough fluid to keep your pee (urine) clear or pale yellow.  Exercise on a regular basis, or as told by your doctor.  Go to the restroom when you feel like you need to poop. Do not hold it.  Only take medicine as told by your doctor. Do not take medicines that help you poop (laxatives) without talking to your doctor first. GET HELP RIGHT AWAY IF:   You have bright red blood in your poop (stool).  Your constipation lasts more than 4 days or gets worse.  You have belly (abdominal) or butt (rectal) pain.  You have thin poop (as thin as a pencil).  You lose weight, and it cannot be explained. MAKE SURE YOU:   Understand these instructions.  Will watch your condition.  Will get help right away if you are not doing well or get worse. Document Released: 08/16/2007 Document Revised: 03/04/2013 Document Reviewed: 12/09/2012 [facility] Patient Information 2015 [place], [place]. This information is not intended to replace advice given to you by your health care provider. Make sure you discuss any questions you have with your health care provider.

## 2014-11-25 NOTE — ED Notes (Signed)
Pt states he has diverticulitis and had some diarrhea Sunday. Yesterday had a little bit. This morning woke up at 0300 with lower abd pain and rectal pain along with nausea. Drank some ginger ale and that eased his nausea a little. The nausea is intermittent.

## 2014-11-26 ENCOUNTER — Ambulatory Visit (INDEPENDENT_AMBULATORY_CARE_PROVIDER_SITE_OTHER): Payer: Medicare Other | Admitting: Cardiovascular Disease

## 2014-11-26 DIAGNOSIS — Z5181 Encounter for therapeutic drug level monitoring: Secondary | ICD-10-CM

## 2014-11-26 DIAGNOSIS — I482 Chronic atrial fibrillation, unspecified: Secondary | ICD-10-CM

## 2014-11-26 LAB — POCT INR: INR: 2.8

## 2014-11-30 ENCOUNTER — Ambulatory Visit (INDEPENDENT_AMBULATORY_CARE_PROVIDER_SITE_OTHER): Payer: Medicare Other

## 2014-11-30 DIAGNOSIS — I255 Ischemic cardiomyopathy: Secondary | ICD-10-CM

## 2014-11-30 DIAGNOSIS — Z95810 Presence of automatic (implantable) cardiac defibrillator: Secondary | ICD-10-CM

## 2014-11-30 NOTE — Progress Notes (Signed)
EPIC Encounter for ICM Monitoring  Patient Name: [name] is a 81 y.o. male Date: 11/30/2014 Primary Care Physican: [name], MD Primary Cardiologist: Burt Knack Electrophysiologist: Caryl Comes Dry Weight: 181.4 lb       In the past month, have you:  1. Gained more than 2 pounds in a day or more than 5 pounds in a week? no  2. Had changes in your medications (with verification of current medications)? no  3. Had more shortness of breath than is usual for you? no  4. Limited your activity because of shortness of breath? no  5. Not been able to sleep because of shortness of breath? no  6. Had increased swelling in your feet or ankles? no  7. Had symptoms of dehydration (dizziness, dry mouth, increased thirst, decreased urine output) no  8. Had changes in sodium restriction? no  9. Been compliant with medication? Yes   ICM trend:   Follow-up plan: ICM clinic phone appointment 12/31/2014.  Patient reported he is feeling well.  He did go to ER on 11/25/2014 for abdominal pain, constipation.  He has follow up with PCP on 12/04/2014.  No changes today.   Copy of note sent to patient's primary care physician, primary cardiologist, and device following physician.  Rosalene Billings, RN, CCM 11/30/2014 9:13 AM

## 2014-12-04 DIAGNOSIS — K5900 Constipation, unspecified: Secondary | ICD-10-CM | POA: Diagnosis not present

## 2014-12-04 DIAGNOSIS — K644 Residual hemorrhoidal skin tags: Secondary | ICD-10-CM | POA: Diagnosis not present

## 2014-12-11 ENCOUNTER — Ambulatory Visit (INDEPENDENT_AMBULATORY_CARE_PROVIDER_SITE_OTHER): Payer: Medicare Other | Admitting: Pharmacist

## 2014-12-11 DIAGNOSIS — I482 Chronic atrial fibrillation, unspecified: Secondary | ICD-10-CM

## 2014-12-11 DIAGNOSIS — Z5181 Encounter for therapeutic drug level monitoring: Secondary | ICD-10-CM

## 2014-12-11 LAB — POCT INR: INR: 3.3

## 2014-12-15 DIAGNOSIS — R269 Unspecified abnormalities of gait and mobility: Secondary | ICD-10-CM | POA: Diagnosis not present

## 2014-12-15 DIAGNOSIS — M810 Age-related osteoporosis without current pathological fracture: Secondary | ICD-10-CM | POA: Diagnosis not present

## 2014-12-15 DIAGNOSIS — K5900 Constipation, unspecified: Secondary | ICD-10-CM | POA: Diagnosis not present

## 2014-12-15 DIAGNOSIS — E785 Hyperlipidemia, unspecified: Secondary | ICD-10-CM | POA: Diagnosis not present

## 2014-12-15 DIAGNOSIS — Z7901 Long term (current) use of anticoagulants: Secondary | ICD-10-CM | POA: Diagnosis not present

## 2014-12-15 DIAGNOSIS — I429 Cardiomyopathy, unspecified: Secondary | ICD-10-CM | POA: Diagnosis not present

## 2014-12-15 DIAGNOSIS — Z23 Encounter for immunization: Secondary | ICD-10-CM | POA: Diagnosis not present

## 2014-12-15 DIAGNOSIS — R627 Adult failure to thrive: Secondary | ICD-10-CM | POA: Diagnosis not present

## 2014-12-15 DIAGNOSIS — Z6824 Body mass index (BMI) 24.0-24.9, adult: Secondary | ICD-10-CM | POA: Diagnosis not present

## 2014-12-17 ENCOUNTER — Other Ambulatory Visit: Payer: Self-pay | Admitting: Cardiovascular Disease

## 2014-12-25 ENCOUNTER — Ambulatory Visit (INDEPENDENT_AMBULATORY_CARE_PROVIDER_SITE_OTHER): Payer: Medicare Other | Admitting: Cardiology

## 2014-12-25 DIAGNOSIS — Z5181 Encounter for therapeutic drug level monitoring: Secondary | ICD-10-CM

## 2014-12-25 DIAGNOSIS — I482 Chronic atrial fibrillation, unspecified: Secondary | ICD-10-CM

## 2014-12-25 DIAGNOSIS — I4891 Unspecified atrial fibrillation: Secondary | ICD-10-CM | POA: Diagnosis not present

## 2014-12-25 DIAGNOSIS — Z7901 Long term (current) use of anticoagulants: Secondary | ICD-10-CM | POA: Diagnosis not present

## 2014-12-25 LAB — POCT INR: INR: 3.6

## 2014-12-31 ENCOUNTER — Ambulatory Visit (INDEPENDENT_AMBULATORY_CARE_PROVIDER_SITE_OTHER): Payer: Medicare Other

## 2014-12-31 DIAGNOSIS — I255 Ischemic cardiomyopathy: Secondary | ICD-10-CM | POA: Diagnosis not present

## 2014-12-31 DIAGNOSIS — Z95810 Presence of automatic (implantable) cardiac defibrillator: Secondary | ICD-10-CM

## 2014-12-31 NOTE — Progress Notes (Signed)
EPIC Encounter for ICM Monitoring  Patient Name: [name] is a 81 y.o. male Date: 12/31/2014 Primary Care Physican: [name], MD Primary Cardiologist: Burt Knack Electrophysiologist: Caryl Comes Dry Weight: 181.6 lb       In the past month, have you:  1. Gained more than 2 pounds in a day or more than 5 pounds in a week? no  2. Had changes in your medications (with verification of current medications)? no  3. Had more shortness of breath than is usual for you? no  4. Limited your activity because of shortness of breath? no  5. Not been able to sleep because of shortness of breath? no  6. Had increased swelling in your feet or ankles? no  7. Had symptoms of dehydration (dizziness, dry mouth, increased thirst, decreased urine output) no  8. Had changes in sodium restriction? no  9. Been compliant with medication? Yes   ICM trend:   Follow-up plan: ICM clinic phone appointment on 02/01/2015.  Optivol impedance trending along baseline.  Patient reported only problem is back pain from a fall that occurred a couple of years ago.  Denied any HF symptoms.  No changes today.  Copy of note sent to patient's primary care physician, primary cardiologist, and device following physician.  Rosalene Billings, RN, CCM 12/31/2014 9:27 AM

## 2015-01-01 ENCOUNTER — Ambulatory Visit (INDEPENDENT_AMBULATORY_CARE_PROVIDER_SITE_OTHER): Payer: Medicare Other | Admitting: Pharmacist

## 2015-01-01 DIAGNOSIS — Z5181 Encounter for therapeutic drug level monitoring: Secondary | ICD-10-CM

## 2015-01-01 DIAGNOSIS — I482 Chronic atrial fibrillation, unspecified: Secondary | ICD-10-CM

## 2015-01-01 LAB — POCT INR: INR: 2.2

## 2015-01-04 DIAGNOSIS — Z8546 Personal history of malignant neoplasm of prostate: Secondary | ICD-10-CM | POA: Diagnosis not present

## 2015-01-04 DIAGNOSIS — C61 Malignant neoplasm of prostate: Secondary | ICD-10-CM | POA: Diagnosis not present

## 2015-01-04 DIAGNOSIS — N320 Bladder-neck obstruction: Secondary | ICD-10-CM | POA: Diagnosis not present

## 2015-01-11 ENCOUNTER — Ambulatory Visit (INDEPENDENT_AMBULATORY_CARE_PROVIDER_SITE_OTHER): Payer: Medicare Other | Admitting: *Deleted

## 2015-01-11 DIAGNOSIS — I482 Chronic atrial fibrillation, unspecified: Secondary | ICD-10-CM

## 2015-01-11 DIAGNOSIS — J209 Acute bronchitis, unspecified: Secondary | ICD-10-CM | POA: Diagnosis not present

## 2015-01-11 DIAGNOSIS — K219 Gastro-esophageal reflux disease without esophagitis: Secondary | ICD-10-CM | POA: Diagnosis not present

## 2015-01-11 DIAGNOSIS — R05 Cough: Secondary | ICD-10-CM | POA: Diagnosis not present

## 2015-01-11 DIAGNOSIS — J309 Allergic rhinitis, unspecified: Secondary | ICD-10-CM | POA: Diagnosis not present

## 2015-01-11 DIAGNOSIS — Z5181 Encounter for therapeutic drug level monitoring: Secondary | ICD-10-CM | POA: Diagnosis not present

## 2015-01-11 DIAGNOSIS — Z6824 Body mass index (BMI) 24.0-24.9, adult: Secondary | ICD-10-CM | POA: Diagnosis not present

## 2015-01-11 LAB — POCT INR: INR: 2.7

## 2015-01-25 ENCOUNTER — Ambulatory Visit (INDEPENDENT_AMBULATORY_CARE_PROVIDER_SITE_OTHER): Payer: Medicare Other | Admitting: Cardiovascular Disease

## 2015-01-25 DIAGNOSIS — I482 Chronic atrial fibrillation, unspecified: Secondary | ICD-10-CM

## 2015-01-25 DIAGNOSIS — Z5181 Encounter for therapeutic drug level monitoring: Secondary | ICD-10-CM

## 2015-01-25 LAB — POCT INR: INR: 2.2

## 2015-02-01 ENCOUNTER — Ambulatory Visit (INDEPENDENT_AMBULATORY_CARE_PROVIDER_SITE_OTHER): Payer: Medicare Other

## 2015-02-01 DIAGNOSIS — Z95810 Presence of automatic (implantable) cardiac defibrillator: Secondary | ICD-10-CM

## 2015-02-02 NOTE — Progress Notes (Signed)
EPIC Encounter for ICM Monitoring  Patient Name: [name] is a 81 y.o. male Date: 02/02/2015 Primary Care Physican: [name], MD Primary Cardiologist: Burt Knack Electrophysiologist: Caryl Comes Dry Weight: 183 lbs       In the past month, have you:  1. Gained more than 2 pounds in a day or more than 5 pounds in a week? no  2. Had changes in your medications (with verification of current medications)? no  3. Had more shortness of breath than is usual for you? no  4. Limited your activity because of shortness of breath? no  5. Not been able to sleep because of shortness of breath? no  6. Had increased swelling in your feet or ankles? no  7. Had symptoms of dehydration (dizziness, dry mouth, increased thirst, decreased urine output) no  8. Had changes in sodium restriction? no  9. Been compliant with medication? Yes   ICM trend: 02/01/2015   Follow-up plan: ICM clinic phone appointment on 03/09/2015.  Optivol thoracic impedance trending along baseline.  He stated he is feeling well at this time.  Denied any problems at this time.  No changes today.   Copy of note sent to patient's primary care physician, primary cardiologist, and device following physician.  Rosalene Billings, RN, CCM 02/02/2015 1:20 PM

## 2015-02-10 ENCOUNTER — Ambulatory Visit (INDEPENDENT_AMBULATORY_CARE_PROVIDER_SITE_OTHER): Payer: Medicare Other | Admitting: Internal Medicine

## 2015-02-10 DIAGNOSIS — I482 Chronic atrial fibrillation, unspecified: Secondary | ICD-10-CM

## 2015-02-10 DIAGNOSIS — Z5181 Encounter for therapeutic drug level monitoring: Secondary | ICD-10-CM

## 2015-02-10 LAB — POCT INR: INR: 2.3

## 2015-02-25 ENCOUNTER — Ambulatory Visit (INDEPENDENT_AMBULATORY_CARE_PROVIDER_SITE_OTHER): Payer: Medicare Other | Admitting: Cardiovascular Disease

## 2015-02-25 DIAGNOSIS — I482 Chronic atrial fibrillation, unspecified: Secondary | ICD-10-CM

## 2015-02-25 DIAGNOSIS — I4891 Unspecified atrial fibrillation: Secondary | ICD-10-CM | POA: Diagnosis not present

## 2015-02-25 DIAGNOSIS — Z7901 Long term (current) use of anticoagulants: Secondary | ICD-10-CM | POA: Diagnosis not present

## 2015-02-25 DIAGNOSIS — Z5181 Encounter for therapeutic drug level monitoring: Secondary | ICD-10-CM

## 2015-02-25 LAB — POCT INR: INR: 3.1

## 2015-03-09 ENCOUNTER — Ambulatory Visit (INDEPENDENT_AMBULATORY_CARE_PROVIDER_SITE_OTHER): Payer: Medicare Other

## 2015-03-09 DIAGNOSIS — Z95810 Presence of automatic (implantable) cardiac defibrillator: Secondary | ICD-10-CM | POA: Diagnosis not present

## 2015-03-09 DIAGNOSIS — I255 Ischemic cardiomyopathy: Secondary | ICD-10-CM

## 2015-03-11 ENCOUNTER — Ambulatory Visit (INDEPENDENT_AMBULATORY_CARE_PROVIDER_SITE_OTHER): Payer: Medicare Other | Admitting: Cardiovascular Disease

## 2015-03-11 DIAGNOSIS — I482 Chronic atrial fibrillation, unspecified: Secondary | ICD-10-CM

## 2015-03-11 DIAGNOSIS — Z5181 Encounter for therapeutic drug level monitoring: Secondary | ICD-10-CM

## 2015-03-11 LAB — POCT INR: INR: 2.7

## 2015-03-11 NOTE — Progress Notes (Signed)
EPIC Encounter for ICM Monitoring  Patient Name: [name] is a 81 y.o. male Date: 03/11/2015 Primary Care Physican: [name], MD Primary Cardiologist: Burt Knack Electrophysiologist: Caryl Comes Dry Weight: 184 lb       In the past month, have you:  1. Gained more than 2 pounds in a day or more than 5 pounds in a week? no  2. Had changes in your medications (with verification of current medications)? no  3. Had more shortness of breath than is usual for you? no  4. Limited your activity because of shortness of breath? no  5. Not been able to sleep because of shortness of breath? no  6. Had increased swelling in your feet or ankles? no  7. Had symptoms of dehydration (dizziness, dry mouth, increased thirst, decreased urine output) no  8. Had changes in sodium restriction? no  9. Been compliant with medication? Yes   ICM trend:   Follow-up plan: ICM clinic phone appointment on 04/12/2015.  Optivol thoracic impedance trending along baseline.  Patient denied any HF symptoms and reported he is following low sodium diet.  No changes today.    Copy of note sent to patient's primary care physician, primary cardiologist, and device following physician.  Rosalene Billings, RN, CCM 03/11/2015 10:02 AM

## 2015-03-12 ENCOUNTER — Encounter: Payer: Self-pay | Admitting: *Deleted

## 2015-03-16 ENCOUNTER — Other Ambulatory Visit: Payer: Self-pay

## 2015-03-16 ENCOUNTER — Ambulatory Visit (HOSPITAL_COMMUNITY): Payer: Medicare Other | Attending: Cardiovascular Disease

## 2015-03-16 DIAGNOSIS — I059 Rheumatic mitral valve disease, unspecified: Secondary | ICD-10-CM | POA: Diagnosis not present

## 2015-03-16 DIAGNOSIS — Z952 Presence of prosthetic heart valve: Secondary | ICD-10-CM | POA: Diagnosis not present

## 2015-03-16 DIAGNOSIS — I517 Cardiomegaly: Secondary | ICD-10-CM | POA: Diagnosis not present

## 2015-03-16 DIAGNOSIS — R29898 Other symptoms and signs involving the musculoskeletal system: Secondary | ICD-10-CM | POA: Insufficient documentation

## 2015-03-16 DIAGNOSIS — I359 Nonrheumatic aortic valve disorder, unspecified: Secondary | ICD-10-CM | POA: Diagnosis not present

## 2015-03-16 DIAGNOSIS — E785 Hyperlipidemia, unspecified: Secondary | ICD-10-CM | POA: Diagnosis not present

## 2015-03-16 DIAGNOSIS — I351 Nonrheumatic aortic (valve) insufficiency: Secondary | ICD-10-CM | POA: Diagnosis not present

## 2015-03-16 DIAGNOSIS — I5022 Chronic systolic (congestive) heart failure: Secondary | ICD-10-CM | POA: Diagnosis not present

## 2015-03-18 DIAGNOSIS — M25551 Pain in right hip: Secondary | ICD-10-CM | POA: Diagnosis not present

## 2015-03-18 DIAGNOSIS — M47816 Spondylosis without myelopathy or radiculopathy, lumbar region: Secondary | ICD-10-CM | POA: Diagnosis not present

## 2015-03-18 DIAGNOSIS — M810 Age-related osteoporosis without current pathological fracture: Secondary | ICD-10-CM | POA: Diagnosis not present

## 2015-03-19 ENCOUNTER — Telehealth: Payer: Self-pay | Admitting: *Deleted

## 2015-03-19 ENCOUNTER — Encounter: Payer: Self-pay | Admitting: Cardiovascular Disease

## 2015-03-19 ENCOUNTER — Ambulatory Visit (INDEPENDENT_AMBULATORY_CARE_PROVIDER_SITE_OTHER): Payer: Medicare Other | Admitting: Cardiovascular Disease

## 2015-03-19 VITALS — BP 106/60 | HR 64 | Ht 75.0 in | Wt 196.0 lb

## 2015-03-19 DIAGNOSIS — R058 Other specified cough: Secondary | ICD-10-CM

## 2015-03-19 DIAGNOSIS — R05 Cough: Secondary | ICD-10-CM | POA: Diagnosis not present

## 2015-03-19 DIAGNOSIS — I5022 Chronic systolic (congestive) heart failure: Secondary | ICD-10-CM

## 2015-03-19 MED ORDER — FUROSEMIDE 20 MG PO TABS: 20.0000 mg | ORAL_TABLET | Freq: Every day | ORAL | Status: DC | PRN

## 2015-03-19 MED ORDER — LEVOFLOXACIN 250 MG PO TABS: 250.0000 mg | ORAL_TABLET | Freq: Every day | ORAL | Status: DC

## 2015-03-19 NOTE — Patient Instructions (Addendum)
Medication Instructions:  Your physician has recommended you make the following change in your medication:  1. START Furosemide 20mg  take one tablet by mouth daily as needed for swelling  Dr Burt Knack spoke with Dr Virgina Jock and we will prescribe Levaquin 250mg  take one tablet by mouth daily for 5 days  Labwork: No new orders.   Testing/Procedures: No new orders.   Follow-Up: Your physician wants you to follow-up in: 4 MONTHS with Dr Burt Knack.  You will receive a reminder letter in the mail two months in advance. If you don't receive a letter, please call our office to schedule the follow-up appointment.   Any Other Special Instructions Will Be Listed Below (If Applicable).     If you need a refill on your cardiac medications before your next appointment, please call your pharmacy.

## 2015-03-19 NOTE — Telephone Encounter (Signed)
[facility] was notified that patient was starting Levaquin 250mg  daily for 5 days. Called the patient & had to leave a message to call the [facility] office regarding the medication due to the fact Levaquin interacts with Coumadin.

## 2015-03-19 NOTE — Telephone Encounter (Signed)
Spoke with patient and advised that the Levaquin can potentially interfere with Coumadin/Warfarin and he verbalized understanding.  The patient verbalized understanding and will perform an INR check on 03/22/15.

## 2015-03-19 NOTE — Progress Notes (Signed)
Cardiology Office Note Date:  03/20/2015   ID:  [name], DOB 09-28-1932, MRN [id number]  PCP:  [name], MD  Cardiologist:  Sherren Mocha, MD    Chief Complaint  Patient presents with  . Congestive Heart Failure    History of Present Illness: [name] is a 82 y.o. male who presents for follow-up of congestive heart failure and aortic valve disease. He has coronary artery disease and initially was treated with CABG in 1979 and then had redo CABG in 1991. He's had a severe ischemic cardiomyopathy with LVEF 25%. He then underwent transcatheter aortic valve replacement at the [facility] clinic in 2012 for treatment of severe symptomatic aortic stenosis. He has had moderate perivalvular AI. The patient has permanent atrial fibrillation and is maintained on long-term anticoagulation.  He has primarily been limited by low back pain and gait unsteadiness over recent years. He does admit to shortness of breath with activity. He denies chest pain or pressure. No orthopnea or PND. He does complain of episodic leg swelling.    Past Medical History  Diagnosis Date  . Ischemic cardiomyopathy 05/2010    Has EF of 25%  . Diverticulitis     CURRENTLY CONTROLLED WITH NO EVIDENCE OF RECURRENT INFECTION  . AF (atrial fibrillation) ([place])     Has not tolerated amiodarone in the past. Amiodarone was stopped in September of 2010 due to side effects  . Chronic anticoagulation     on coumadin  . Prostate cancer ([place])   . Osteoarthritis     RIGHT KNEE  . Aortic stenosis, severe     WITH PERCUTANEOUS AORTIC VALVE (TAVI) IN March 2012 at the [facility]  . MI (myocardial infarction) ([place]) 1974, 1978  . Cervical myelopathy ([place])   . Pulmonary embolism ([place])   . Hyperlipidemia   . GERD (gastroesophageal reflux disease)   . Esophageal stricture     WITH DILATATION  . NSVT (nonsustained ventricular tachycardia) ([place])   . Coronary artery disease   . Stroke ([place])   . Heart murmur   .  Osteoporosis     Past Surgical History  Procedure Laterality Date  . Icd  Feb 2003; 02/2013    gen change 02-11-2013 by Dr Caryl Comes  . Coronary artery bypass graft  1979  . Coronary artery bypass graft  1991    REDO SURGERY  . Cardiac catheterization  2010    SEVERE LV DYSFUNCTION WITH ESTIMATED EJECTION FRACTION OF 25%  . Cholecystectomy    . Aortic valve replacement      Percutaneous AVR in March 2012 at the [facility]  . Cardioversion  02/16/2011    Procedure: CARDIOVERSION;  Surgeon: Carlena Bjornstad, MD;  Location: [facility];  Service: Cardiovascular;  Laterality: N/A;  . Shoulder surgery    . Back surgery    . Implantable cardioverter defibrillator (icd) generator change N/A 02/10/2013    Procedure: ICD GENERATOR CHANGE;  Surgeon: Deboraha Sprang, MD;  Location: [facility] CATH LAB;  Service: Cardiovascular;  Laterality: N/A;    Current Outpatient Prescriptions  Medication Sig Dispense Refill  . acetaminophen (TYLENOL) 500 MG tablet Take 1,000 mg by mouth every 6 (six) hours as needed.    Marland Kitchen alfuzosin (UROXATRAL) 10 MG 24 hr tablet Take 10 mg by mouth at bedtime.     Marland Kitchen atorvastatin (LIPITOR) 10 MG tablet TAKE ONE TABLET EVERY OTHER DAY. 45 tablet 0  . Budesonide-Formoterol Fumarate (SYMBICORT IN) Inhale 1 puff into the lungs 2 (  two) times daily.    . calcium carbonate (OS-CAL) 600 MG TABS tablet Take 600 mg by mouth 2 (two) times daily with a meal. 600 +D    . carvedilol (COREG) 6.25 MG tablet TAKE 1 TABLET TWICE DAILY WITH A MEAL. 60 tablet 6  . cholecalciferol (VITAMIN D) 1000 UNITS tablet Take 1,000 Units by mouth 2 (two) times daily.    Marland Kitchen COUMADIN 5 MG tablet TAKE AS DIRECTED BY COUMADIN CLINIC. 90 tablet 1  . HYDROcodone-acetaminophen (NORCO/VICODIN) 5-325 MG per tablet Take 1 tablet by mouth every 6 (six) hours as needed for moderate pain. 30 tablet 0  . ipratropium (ATROVENT) 0.03 % nasal spray Place 2 sprays into both nostrils 2 (two) times daily.    Marland Kitchen levothyroxine (SYNTHROID,  LEVOTHROID) 150 MCG tablet Take 150 mcg by mouth daily before breakfast.    . Multiple Vitamins-Minerals (MULTIVITAMIN PO) Take 1 tablet by mouth daily.    Marland Kitchen NITROSTAT 0.4 MG SL tablet DISSOLVE 1 TABLET UNDER TONGUE AS NEEDED FOR CHEST PAIN,MAY REPEAT IN5 MINUTES FOR 2 DOSES. 25 tablet 0  . omeprazole (PRILOSEC) 20 MG capsule Take 20 mg by mouth daily as needed (acid reflux).    . polyethylene glycol (MIRALAX / GLYCOLAX) packet Take 17 g by mouth daily as needed. 10 each 0  . Probiotic Product (PROBIOTIC DAILY PO) Take 1 capsule by mouth daily.    . RESTASIS 0.05 % ophthalmic emulsion Place 1 drop into both eyes every 12 (twelve) hours.     . furosemide (LASIX) 20 MG tablet Take 1 tablet (20 mg total) by mouth daily as needed (for swelling). 30 tablet 3  . levofloxacin (LEVAQUIN) 250 MG tablet Take 1 tablet (250 mg total) by mouth daily. 5 tablet 0   No current facility-administered medications for this visit.    Allergies:   Penicillins; Ace inhibitors; Antihistamines, diphenhydramine-type; and Amiodarone   Social History:  The patient  reports that he quit smoking about 45 years ago. His smoking use included Cigarettes. He has a 60 pack-year smoking history. He has never used smokeless tobacco. He reports that he does not drink alcohol or use illicit drugs.   Family History:  The patient's  family history includes Diabetes (age of onset: 34) in his mother; Heart disease (age of onset: 81) in his mother; Heart disease (age of onset: 50) in his father.    ROS:  Please see the history of present illness.  Otherwise, review of systems is positive for easy bruising, wheezing, constipation, balance problems.  All other systems are reviewed and negative.    PHYSICAL EXAM: VS:  BP 106/60 mmHg  Pulse 64  Ht 6\' 3"  (1.905 m)  Wt 196 lb (88.905 kg)  BMI 24.50 kg/m2 , BMI Body mass index is 24.5 kg/(m^2). GEN: Pleasant elderly male, in no acute distress HEENT: normal Neck: no JVD, no masses. No  carotid bruits Cardiac: irregularly irregular without murmur or gallop           Respiratory:  clear to auscultation bilaterally, normal work of breathing GI: soft, nontender, nondistended, + BS MS: no deformity or atrophy Ext: trace pretibial and pedal edema, pedal pulses 2+= bilaterally Skin: warm and dry, no rash Neuro:  Strength and sensation are intact Psych: euthymic mood, full affect  EKG:  EKG is not ordered today.  Recent Labs: 11/25/2014: ALT 16*; BUN 8; Creatinine, Ser 0.96; Hemoglobin 13.8; Platelets 161; Potassium 4.3; Sodium 128*   Lipid Panel     Component Value  Date/Time   CHOL 157 10/06/2014 1143   TRIG 76.0 10/06/2014 1143   HDL 49.50 10/06/2014 1143   CHOLHDL 3 10/06/2014 1143   VLDL 15.2 10/06/2014 1143   LDLCALC 92 10/06/2014 1143      Wt Readings from Last 3 Encounters:  03/19/15 196 lb (88.905 kg)  11/25/14 181 lb (82.101 kg)  11/18/14 193 lb (87.544 kg)     Cardiac Studies Reviewed: 2D Echo 03/16/2015: Study Conclusions  - Left ventricle: Inferior , septal and apical akinesis. The cavity size was severely dilated. Wall thickness was normal. Systolic function was severely reduced. The estimated ejection fraction was in the range of 20% to 25%. - Aortic valve: Post TAVR. Persistant moderate perivalvular regurgitation. - Mitral valve: Calcified annulus. - Left atrium: The atrium was moderately dilated. - Atrial septum: No defect or patent foramen ovale was identified. - Pulmonary arteries: PA peak pressure: 33 mm Hg (S).  ASSESSMENT AND PLAN: 1.  Chronic systolic heart failure: NYHA II. Pt unable to tolerate an aggressive CHF regimen because of hypotension, generalized weakness. Will continue his current Rx - meds reviewed today. Echo reviewed and shows severe LV dysfunction which is a chronic finding. Add furosemide 20 mg as needed for leg swelling.   2. Aortic valve disease s/p TAVR: moderate paravalvular AI unchanged from previous echo  studies. LV dimensions stable as well.   3. Chronic atrial fibrillation: maintained on warfarin, heart rate controlled.   4. CAD s/p CABG: no angina  5. Hyperlipidemia: treated with atorvastatin. Most recent lipids reviewed.   6. Cough suspected URI: pt compromised secondary to severe cardiac disease. D/W Dr Virgina Jock - prescribed Levaquin 250 mg x 5 days.   Current medicines are reviewed with the patient today.  The patient does not have concerns regarding medicines.  Labs/ tests ordered today include:  No orders of the defined types were placed in this encounter.   Disposition:   FU 4 months  Signed, Sherren Mocha, MD  03/20/2015 5:34 PM    [address], [place], [place]  [address] Phone: [phone]; Fax: [phone]

## 2015-03-22 ENCOUNTER — Ambulatory Visit (INDEPENDENT_AMBULATORY_CARE_PROVIDER_SITE_OTHER): Payer: Medicare Other | Admitting: Interventional Cardiology

## 2015-03-22 DIAGNOSIS — Z5181 Encounter for therapeutic drug level monitoring: Secondary | ICD-10-CM

## 2015-03-22 DIAGNOSIS — I482 Chronic atrial fibrillation, unspecified: Secondary | ICD-10-CM

## 2015-03-22 LAB — POCT INR: INR: 3.1

## 2015-03-24 ENCOUNTER — Encounter (HOSPITAL_COMMUNITY): Payer: Self-pay | Admitting: Emergency Medicine

## 2015-03-24 ENCOUNTER — Emergency Department (HOSPITAL_COMMUNITY): Payer: Medicare Other

## 2015-03-24 ENCOUNTER — Emergency Department (HOSPITAL_COMMUNITY)
Admission: EM | Admit: 2015-03-24 | Discharge: 2015-03-24 | Disposition: A | Discharge status: Home / Self Care | Payer: Medicare Other | Attending: Emergency Medicine | Admitting: Emergency Medicine

## 2015-03-24 DIAGNOSIS — Z86711 Personal history of pulmonary embolism: Secondary | ICD-10-CM | POA: Insufficient documentation

## 2015-03-24 DIAGNOSIS — K219 Gastro-esophageal reflux disease without esophagitis: Secondary | ICD-10-CM | POA: Insufficient documentation

## 2015-03-24 DIAGNOSIS — Z880 Allergy status to penicillin: Secondary | ICD-10-CM | POA: Insufficient documentation

## 2015-03-24 DIAGNOSIS — Z8546 Personal history of malignant neoplasm of prostate: Secondary | ICD-10-CM | POA: Insufficient documentation

## 2015-03-24 DIAGNOSIS — Z8669 Personal history of other diseases of the nervous system and sense organs: Secondary | ICD-10-CM | POA: Insufficient documentation

## 2015-03-24 DIAGNOSIS — I4891 Unspecified atrial fibrillation: Secondary | ICD-10-CM | POA: Insufficient documentation

## 2015-03-24 DIAGNOSIS — J069 Acute upper respiratory infection, unspecified: Secondary | ICD-10-CM | POA: Diagnosis not present

## 2015-03-24 DIAGNOSIS — E785 Hyperlipidemia, unspecified: Secondary | ICD-10-CM | POA: Insufficient documentation

## 2015-03-24 DIAGNOSIS — R011 Cardiac murmur, unspecified: Secondary | ICD-10-CM | POA: Insufficient documentation

## 2015-03-24 DIAGNOSIS — Z792 Long term (current) use of antibiotics: Secondary | ICD-10-CM | POA: Diagnosis not present

## 2015-03-24 DIAGNOSIS — Z9889 Other specified postprocedural states: Secondary | ICD-10-CM | POA: Insufficient documentation

## 2015-03-24 DIAGNOSIS — M1990 Unspecified osteoarthritis, unspecified site: Secondary | ICD-10-CM | POA: Diagnosis not present

## 2015-03-24 DIAGNOSIS — Z79899 Other long term (current) drug therapy: Secondary | ICD-10-CM | POA: Insufficient documentation

## 2015-03-24 DIAGNOSIS — I2510 Atherosclerotic heart disease of native coronary artery without angina pectoris: Secondary | ICD-10-CM | POA: Insufficient documentation

## 2015-03-24 DIAGNOSIS — Z7901 Long term (current) use of anticoagulants: Secondary | ICD-10-CM | POA: Diagnosis not present

## 2015-03-24 DIAGNOSIS — I252 Old myocardial infarction: Secondary | ICD-10-CM | POA: Diagnosis not present

## 2015-03-24 DIAGNOSIS — Z8673 Personal history of transient ischemic attack (TIA), and cerebral infarction without residual deficits: Secondary | ICD-10-CM | POA: Diagnosis not present

## 2015-03-24 DIAGNOSIS — Z87891 Personal history of nicotine dependence: Secondary | ICD-10-CM | POA: Diagnosis not present

## 2015-03-24 DIAGNOSIS — Z951 Presence of aortocoronary bypass graft: Secondary | ICD-10-CM | POA: Diagnosis not present

## 2015-03-24 DIAGNOSIS — R05 Cough: Secondary | ICD-10-CM | POA: Diagnosis present

## 2015-03-24 LAB — CBC WITH DIFFERENTIAL/PLATELET
BASOS ABS: 0 10*3/uL (ref 0.0–0.1)
Basophils Relative: 0 %
Eosinophils Absolute: 0.3 10*3/uL (ref 0.0–0.7)
Eosinophils Relative: 4 %
HEMATOCRIT: 40.3 % (ref 39.0–52.0)
Hemoglobin: 13.3 g/dL (ref 13.0–17.0)
LYMPHS ABS: 0.9 10*3/uL (ref 0.7–4.0)
Lymphocytes Relative: 11 %
MCH: 30.6 pg (ref 26.0–34.0)
MCHC: 33 g/dL (ref 30.0–36.0)
MCV: 92.6 fL (ref 78.0–100.0)
MONO ABS: 0.7 10*3/uL (ref 0.1–1.0)
MONOS PCT: 8 %
NEUTROS ABS: 6.4 10*3/uL (ref 1.7–7.7)
Neutrophils Relative %: 77 %
Platelets: 157 10*3/uL (ref 150–400)
RBC: 4.35 MIL/uL (ref 4.22–5.81)
RDW: 12.9 % (ref 11.5–15.5)
WBC: 8.3 10*3/uL (ref 4.0–10.5)

## 2015-03-24 LAB — BASIC METABOLIC PANEL
Anion gap: 7 (ref 5–15)
BUN: 12 mg/dL (ref 6–20)
CHLORIDE: 97 mmol/L — AB (ref 101–111)
CO2: 27 mmol/L (ref 22–32)
CREATININE: 0.98 mg/dL (ref 0.61–1.24)
Calcium: 9.1 mg/dL (ref 8.9–10.3)
GFR calc Af Amer: >60 mL/min (ref >60)
GFR calc non Af Amer: >60 mL/min (ref >60)
GLUCOSE: 112 mg/dL — AB (ref 65–99)
POTASSIUM: 4.6 mmol/L (ref 3.5–5.1)
Sodium: 131 mmol/L — ABNORMAL LOW (ref 135–145)

## 2015-03-24 MED ORDER — IPRATROPIUM-ALBUTEROL 0.5-2.5 (3) MG/3ML IN SOLN
3.0000 mL | Freq: Once | RESPIRATORY_TRACT | Status: AC
  Administered 2015-03-24: 3 mL via RESPIRATORY_TRACT
  Filled 2015-03-24: qty 3

## 2015-03-24 MED ORDER — DOXYCYCLINE HYCLATE 100 MG PO CAPS: 100.0000 mg | ORAL_CAPSULE | Freq: Two times a day (BID) | ORAL | Status: DC

## 2015-03-24 NOTE — ED Provider Notes (Signed)
CSN: [id number]     Arrival date & time 03/24/15  [id number] History   First MD Initiated Contact with Patient 03/24/15 0715     Chief Complaint  Patient presents with  . URI     (Consider location/radiation/quality/duration/timing/severity/associated sxs/prior Treatment) HPI Comments: 82 y.o. M with h/o HFrEF and s/p AVR presenting for 1 week of cough, SOB, and wheezing.  He saw his Cardiologist 5 days ago who called his PCP and treated him for URI with 5 day course of Levaquin 250mg  daily.  He finished course of Levaquin yesterday, but has not noticed any improvement in symptoms.  He reports productive cough with brown non-bloody sputum.  He mostly notices SOB with laying flat in bed and has become lightheaded a few times when he is SOB.  He has noticed audible wheezes as well.  Cough and SOB were so bad that it was difficult for him to sleep overnight.  Also endorses some nasal congestion.  Denies any fevers, CP, LE edema.  Patient is former smoker who quit 40 years ago.  Patient is a 82 y.o. male presenting with cough and shortness of breath. The history is provided by the patient and the spouse.  Cough Cough characteristics:  Productive and hacking Sputum characteristics:  [place] Severity:  Moderate Onset quality:  Gradual Duration:  7 days Timing:  Intermittent Progression:  Unchanged Chronicity:  New Smoker: no   Associated symptoms: shortness of breath, sinus congestion and wheezing   Associated symptoms: no chest pain, no chills, no ear pain, no fever and no sore throat   Shortness of breath:    Severity:  Moderate   Onset quality:  Gradual   Duration:  7 days   Timing:  Intermittent   Progression:  Unchanged Shortness of Breath Associated symptoms: cough and wheezing   Associated symptoms: no chest pain, no ear pain, no fever and no sore throat     Past Medical History  Diagnosis Date  . Ischemic cardiomyopathy 05/2010    Has EF of 25%  . Diverticulitis     CURRENTLY  CONTROLLED WITH NO EVIDENCE OF RECURRENT INFECTION  . AF (atrial fibrillation) ([place])     Has not tolerated amiodarone in the past. Amiodarone was stopped in September of 2010 due to side effects  . Chronic anticoagulation     on coumadin  . Prostate cancer ([place])   . Osteoarthritis     RIGHT KNEE  . Aortic stenosis, severe     WITH PERCUTANEOUS AORTIC VALVE (TAVI) IN March 2012 at the [facility]  . MI (myocardial infarction) ([place]) 1974, 1978  . Cervical myelopathy ([place])   . Pulmonary embolism ([place])   . Hyperlipidemia   . GERD (gastroesophageal reflux disease)   . Esophageal stricture     WITH DILATATION  . NSVT (nonsustained ventricular tachycardia) ([place])   . Coronary artery disease   . Heart murmur   . Osteoporosis   . Stroke [facility])     3 strokes   Past Surgical History  Procedure Laterality Date  . Icd  Feb 2003; 02/2013    gen change 02-11-2013 by Dr Caryl Comes  . Coronary artery bypass graft  1979  . Coronary artery bypass graft  1991    REDO SURGERY  . Cardiac catheterization  2010    SEVERE LV DYSFUNCTION WITH ESTIMATED EJECTION FRACTION OF 25%  . Cholecystectomy    . Aortic valve replacement      Percutaneous AVR in March 2012 at the [facility]  .  Cardioversion  02/16/2011    Procedure: CARDIOVERSION;  Surgeon: Carlena Bjornstad, MD;  Location: [facility];  Service: Cardiovascular;  Laterality: N/A;  . Shoulder surgery    . Back surgery    . Implantable cardioverter defibrillator (icd) generator change N/A 02/10/2013    Procedure: ICD GENERATOR CHANGE;  Surgeon: Deboraha Sprang, MD;  Location: [facility] C[facility] LAB;  Service: Cardiovascular;  Laterality: N/A;   Family History  Problem Relation Age of Onset  . Heart disease Mother 39  . Diabetes Mother 10  . Heart disease Father 48   Social History  Substance Use Topics  . Smoking status: Former Smoker -- 4.00 packs/day for 15 years    Types: Cigarettes    Quit date: 06/15/1969  . Smokeless tobacco: Never Used  . Alcohol  Use: No    Review of Systems  Constitutional: Negative for fever and chills.  HENT: Negative for ear pain and sore throat.   Respiratory: Positive for cough, shortness of breath and wheezing.   Cardiovascular: Negative for chest pain and leg swelling.  All other systems reviewed and are negative.     Allergies  Penicillins; Ace inhibitors; Antihistamines, diphenhydramine-type; and Amiodarone  Home Medications   Prior to Admission medications   Medication Sig Start Date End Date Taking? Authorizing Provider  acetaminophen (TYLENOL) 500 MG tablet Take 1,000 mg by mouth every 6 (six) hours as needed.    Historical Provider, MD  alfuzosin (UROXATRAL) 10 MG 24 hr tablet Take 10 mg by mouth at bedtime.     Historical Provider, MD  atorvastatin (LIPITOR) 10 MG tablet TAKE ONE TABLET EVERY OTHER DAY. 07/28/14   Sherren Mocha, MD  Budesonide-Formoterol Fumarate (SYMBICORT IN) Inhale 1 puff into the lungs 2 (two) times daily.    Historical Provider, MD  calcium carbonate (OS-CAL) 600 MG TABS tablet Take 600 mg by mouth 2 (two) times daily with a meal. 600 +D    Historical Provider, MD  carvedilol (COREG) 6.25 MG tablet TAKE 1 TABLET TWICE DAILY WITH A MEAL. 09/01/14   Lelon Perla, MD  cholecalciferol (VITAMIN D) 1000 UNITS tablet Take 1,000 Units by mouth 2 (two) times daily.    Historical Provider, MD  COUMADIN 5 MG tablet TAKE AS DIRECTED BY COUMADIN CLINIC. 12/17/14   Sherren Mocha, MD  furosemide (LASIX) 20 MG tablet Take 1 tablet (20 mg total) by mouth daily as needed (for swelling). 03/19/15   Sherren Mocha, MD  HYDROcodone-acetaminophen (NORCO/VICODIN) 5-325 MG per tablet Take 1 tablet by mouth every 6 (six) hours as needed for moderate pain. 01/20/14   Geradine Girt, DO  ipratropium (ATROVENT) 0.03 % nasal spray Place 2 sprays into both nostrils 2 (two) times daily.    Historical Provider, MD  levofloxacin (LEVAQUIN) 250 MG tablet Take 1 tablet (250 mg total) by mouth daily. 03/19/15    Sherren Mocha, MD  levothyroxine (SYNTHROID, LEVOTHROID) 150 MCG tablet Take 150 mcg by mouth daily before breakfast.    Historical Provider, MD  Multiple Vitamins-Minerals (MULTIVITAMIN PO) Take 1 tablet by mouth daily.    Historical Provider, MD  NITROSTAT 0.4 MG SL tablet DISSOLVE 1 TABLET UNDER TONGUE AS NEEDED FOR CHEST PAIN,MAY REPEAT IN5 MINUTES FOR 2 DOSES. 08/26/14   Sherren Mocha, MD  omeprazole (PRILOSEC) 20 MG capsule Take 20 mg by mouth daily as needed (acid reflux).    Historical Provider, MD  polyethylene glycol (MIRALAX / GLYCOLAX) packet Take 17 g by mouth daily as needed. 11/25/14  Everlene Balls, MD  Probiotic Product (PROBIOTIC DAILY PO) Take 1 capsule by mouth daily.    Historical Provider, MD  RESTASIS 0.05 % ophthalmic emulsion Place 1 drop into both eyes every 12 (twelve) hours.  12/05/11   Historical Provider, MD   BP 102/65 mmHg  Pulse 71  Temp(Src) 97.3 F (36.3 C) (Oral)  Resp 14  Ht 6\' 3"  (1.905 m)  Wt 83.462 kg  BMI 23.00 kg/m2  SpO2 96% Physical Exam  Constitutional: He is oriented to person, place, and time. He appears well-developed and well-nourished. No distress.  HENT:  Head: Normocephalic and atraumatic.  Right Ear: External ear normal.  Left Ear: External ear normal.  Mouth/Throat: Oropharynx is clear and moist. No oropharyngeal exudate.  Eyes: Conjunctivae and EOM are normal. Pupils are equal, round, and reactive to light.  Neck: Neck supple.  Cardiovascular: Normal rate and intact distal pulses.   No murmur heard. Irregularly irregular  Pulmonary/Chest: Effort normal. No respiratory distress.  Rhonchorus breath sounds diffusely and cough with deep inspiration Expiratory wheeze diffusely  Abdominal: Soft. Bowel sounds are normal. He exhibits no distension. There is no tenderness. There is no rebound and no guarding.  Musculoskeletal: He exhibits no edema or tenderness.  Lymphadenopathy:    He has no cervical adenopathy.  Neurological: He is  alert and oriented to person, place, and time.  Skin: Skin is warm and dry. No rash noted.  Psychiatric: He has a normal mood and affect. His behavior is normal.    ED Course  Procedures (including critical care time) Labs Review Labs Reviewed - No data to display  Imaging Review No results found. I have personally reviewed and evaluated these images and lab results as part of my medical decision-making.   EKG Interpretation   Date/Time:  Wednesday March 24 2015 06:56:43 EST Ventricular Rate:  80 PR Interval:    QRS Duration: 107 QT Interval:  382 QTC Calculation: 441 R Axis:   82 Text Interpretation:  Atrial fibrillation Ventricular premature complex  Abnormal lateral Q waves Anterior infarct, old Borderline repol abnrm,  inferolateral leads Baseline wander in lead(s) V5 similar to previous  Confirmed by ZAVITZ  MD, JOSHUA ([id number]) on 03/24/2015 7:10:24 AM      MDM   Final diagnoses:  None    82 y/o M with significant cardiopulmonary history recently completed 5 day course of Levaquin without improvement in symptoms of SOB, wheezing, and cough.  No CP and EKG unchanged from previous, so doubt cardiac ischemic etiology.  CXR clear without evidence of volume excess or infiltrate, so doubt pneumonia or heart failure as cause of symptoms.  Labs show normal WBC and otherwise unremarkable.  Most likely viral URI and cough vs bacterial bronchitis that was treated empirically with low dose antibiotics and showed no improvement due to ineffective treatment.  VSS and breathing comfortably with O2 sat 97% on RA. Given duoneb x1 prior to discharge.  Safe to discharge home with close outpatient follow-up.  Will treat with 10 day course of Doxycycline.  Advised close monitoring of INR.    Virginia Crews, MD 03/24/15 [place], MD 03/24/15 [id number]  Elnora Morrison, MD 03/27/15 1201

## 2015-03-24 NOTE — ED Notes (Signed)
Was put on Levaquin last week by Dr. Virgina Jock, took last dose yesterday-- still is coughing "so much I can't sleep"-- no fever subjectively,

## 2015-03-24 NOTE — Discharge Instructions (Signed)
Upper Respiratory Infection, Adult Most upper respiratory infections (URIs) are a viral infection of the air passages leading to the lungs. A URI affects the nose, throat, and upper air passages. The most common type of URI is nasopharyngitis and is typically referred to as "the common cold." URIs run their course and usually go away on their own. Most of the time, a URI does not require medical attention, but sometimes a bacterial infection in the upper airways can follow a viral infection. This is called a secondary infection. Sinus and middle ear infections are common types of secondary upper respiratory infections. Bacterial pneumonia can also complicate a URI. A URI can worsen asthma and chronic obstructive pulmonary disease (COPD). Sometimes, these complications can require emergency medical care and may be life threatening.  CAUSES Almost all URIs are caused by viruses. A virus is a type of germ and can spread from one person to another.  RISKS FACTORS You may be at risk for a URI if:   You smoke.   You have chronic heart or lung disease.  You have a weakened defense (immune) system.   You are very young or very old.   You have nasal allergies or asthma.  You work in crowded or poorly ventilated areas.  You work in health care facilities or schools. SIGNS AND SYMPTOMS  Symptoms typically develop 2-3 days after you come in contact with a cold virus. Most viral URIs last 7-10 days. However, viral URIs from the influenza virus (flu virus) can last 14-18 days and are typically more severe. Symptoms may include:   Runny or stuffy (congested) nose.   Sneezing.   Cough.   Sore throat.   Headache.   Fatigue.   Fever.   Loss of appetite.   Pain in your forehead, behind your eyes, and over your cheekbones (sinus pain).  Muscle aches.  DIAGNOSIS  Your health care provider may diagnose a URI by:  Physical exam.  Tests to check that your symptoms are not due to  another condition such as:  Strep throat.  Sinusitis.  Pneumonia.  Asthma. TREATMENT  A URI goes away on its own with time. It cannot be cured with medicines, but medicines may be prescribed or recommended to relieve symptoms. Medicines may help:  Reduce your fever.  Reduce your cough.  Relieve nasal congestion. HOME CARE INSTRUCTIONS   Take medicines only as directed by your health care provider.   Gargle warm saltwater or take cough drops to comfort your throat as directed by your health care provider.  Use a warm mist humidifier or inhale steam from a shower to increase air moisture. This may make it easier to breathe.  Drink enough fluid to keep your urine clear or pale yellow.   Eat soups and other clear broths and maintain good nutrition.   Rest as needed.   Return to work when your temperature has returned to normal or as your health care provider advises. You may need to stay home longer to avoid infecting others. You can also use a face mask and careful hand washing to prevent spread of the virus.  Increase the usage of your inhaler if you have asthma.   Do not use any tobacco products, including cigarettes, chewing tobacco, or electronic cigarettes. If you need help quitting, ask your health care provider. PREVENTION  The best way to protect yourself from getting a cold is to practice good hygiene.   Avoid oral or hand contact with people with cold   symptoms.   Wash your hands often if contact occurs.  There is no clear evidence that vitamin C, vitamin E, echinacea, or exercise reduces the chance of developing a cold. However, it is always recommended to get plenty of rest, exercise, and practice good nutrition.  SEEK MEDICAL CARE IF:   You are getting worse rather than better.   Your symptoms are not controlled by medicine.   You have chills.  You have worsening shortness of breath.  You have brown or red mucus.  You have yellow or brown nasal  discharge.  You have pain in your face, especially when you bend forward.  You have a fever.  You have swollen neck glands.  You have pain while swallowing.  You have white areas in the back of your throat. SEEK IMMEDIATE MEDICAL CARE IF:   You have severe or persistent:  Headache.  Ear pain.  Sinus pain.  Chest pain.  You have chronic lung disease and any of the following:  Wheezing.  Prolonged cough.  Coughing up blood.  A change in your usual mucus.  You have a stiff neck.  You have changes in your:  Vision.  Hearing.  Thinking.  Mood. MAKE SURE YOU:   Understand these instructions.  Will watch your condition.  Will get help right away if you are not doing well or get worse.   This information is not intended to replace advice given to you by your health care provider. Make sure you discuss any questions you have with your health care provider.   Document Released: 08/23/2000 Document Revised: 07/14/2014 Document Reviewed: 06/04/2013 Elsevier Interactive Patient Education 2016 [employer]  

## 2015-03-25 ENCOUNTER — Telehealth: Payer: Self-pay | Admitting: Pharmacist

## 2015-03-25 NOTE — Telephone Encounter (Signed)
Pt called to report that he was started on doxycycline 100mg  BID x10 days yesterday. Advised pt to check his INR tomorrow (day 3 of 10) and call us with the result.

## 2015-03-26 ENCOUNTER — Ambulatory Visit (INDEPENDENT_AMBULATORY_CARE_PROVIDER_SITE_OTHER): Payer: Medicare Other | Admitting: Internal Medicine

## 2015-03-26 DIAGNOSIS — Z7901 Long term (current) use of anticoagulants: Secondary | ICD-10-CM | POA: Diagnosis not present

## 2015-03-26 DIAGNOSIS — J209 Acute bronchitis, unspecified: Secondary | ICD-10-CM | POA: Diagnosis not present

## 2015-03-26 DIAGNOSIS — Z5181 Encounter for therapeutic drug level monitoring: Secondary | ICD-10-CM

## 2015-03-26 DIAGNOSIS — I482 Chronic atrial fibrillation, unspecified: Secondary | ICD-10-CM

## 2015-03-26 DIAGNOSIS — I4891 Unspecified atrial fibrillation: Secondary | ICD-10-CM | POA: Diagnosis not present

## 2015-03-26 DIAGNOSIS — Z6824 Body mass index (BMI) 24.0-24.9, adult: Secondary | ICD-10-CM | POA: Diagnosis not present

## 2015-03-26 DIAGNOSIS — R062 Wheezing: Secondary | ICD-10-CM | POA: Diagnosis not present

## 2015-03-26 DIAGNOSIS — J309 Allergic rhinitis, unspecified: Secondary | ICD-10-CM | POA: Diagnosis not present

## 2015-03-26 DIAGNOSIS — R05 Cough: Secondary | ICD-10-CM | POA: Diagnosis not present

## 2015-03-26 LAB — PROTIME-INR

## 2015-03-26 LAB — POCT INR: INR: 2.5

## 2015-03-28 ENCOUNTER — Emergency Department (HOSPITAL_COMMUNITY)
Admission: EM | Admit: 2015-03-28 | Discharge: 2015-03-28 | Disposition: A | Discharge status: Home / Self Care | Payer: Medicare Other | Attending: Emergency Medicine | Admitting: Emergency Medicine

## 2015-03-28 ENCOUNTER — Emergency Department (HOSPITAL_COMMUNITY): Payer: Medicare Other

## 2015-03-28 ENCOUNTER — Encounter (HOSPITAL_COMMUNITY): Payer: Self-pay | Admitting: *Deleted

## 2015-03-28 DIAGNOSIS — Z79899 Other long term (current) drug therapy: Secondary | ICD-10-CM | POA: Diagnosis not present

## 2015-03-28 DIAGNOSIS — Z87891 Personal history of nicotine dependence: Secondary | ICD-10-CM | POA: Diagnosis not present

## 2015-03-28 DIAGNOSIS — M810 Age-related osteoporosis without current pathological fracture: Secondary | ICD-10-CM | POA: Insufficient documentation

## 2015-03-28 DIAGNOSIS — Z792 Long term (current) use of antibiotics: Secondary | ICD-10-CM | POA: Diagnosis not present

## 2015-03-28 DIAGNOSIS — J40 Bronchitis, not specified as acute or chronic: Secondary | ICD-10-CM | POA: Diagnosis not present

## 2015-03-28 DIAGNOSIS — I252 Old myocardial infarction: Secondary | ICD-10-CM | POA: Insufficient documentation

## 2015-03-28 DIAGNOSIS — Z951 Presence of aortocoronary bypass graft: Secondary | ICD-10-CM | POA: Insufficient documentation

## 2015-03-28 DIAGNOSIS — Z7951 Long term (current) use of inhaled steroids: Secondary | ICD-10-CM | POA: Insufficient documentation

## 2015-03-28 DIAGNOSIS — E785 Hyperlipidemia, unspecified: Secondary | ICD-10-CM | POA: Diagnosis not present

## 2015-03-28 DIAGNOSIS — J9801 Acute bronchospasm: Secondary | ICD-10-CM | POA: Diagnosis not present

## 2015-03-28 DIAGNOSIS — Z8673 Personal history of transient ischemic attack (TIA), and cerebral infarction without residual deficits: Secondary | ICD-10-CM | POA: Diagnosis not present

## 2015-03-28 DIAGNOSIS — R011 Cardiac murmur, unspecified: Secondary | ICD-10-CM | POA: Diagnosis not present

## 2015-03-28 DIAGNOSIS — J209 Acute bronchitis, unspecified: Secondary | ICD-10-CM | POA: Diagnosis not present

## 2015-03-28 DIAGNOSIS — R05 Cough: Secondary | ICD-10-CM | POA: Diagnosis not present

## 2015-03-28 DIAGNOSIS — Z8546 Personal history of malignant neoplasm of prostate: Secondary | ICD-10-CM | POA: Insufficient documentation

## 2015-03-28 DIAGNOSIS — Z95810 Presence of automatic (implantable) cardiac defibrillator: Secondary | ICD-10-CM | POA: Diagnosis not present

## 2015-03-28 DIAGNOSIS — Z7901 Long term (current) use of anticoagulants: Secondary | ICD-10-CM | POA: Insufficient documentation

## 2015-03-28 DIAGNOSIS — I2510 Atherosclerotic heart disease of native coronary artery without angina pectoris: Secondary | ICD-10-CM | POA: Insufficient documentation

## 2015-03-28 DIAGNOSIS — Z880 Allergy status to penicillin: Secondary | ICD-10-CM | POA: Diagnosis not present

## 2015-03-28 DIAGNOSIS — Z86711 Personal history of pulmonary embolism: Secondary | ICD-10-CM | POA: Diagnosis not present

## 2015-03-28 DIAGNOSIS — Z8669 Personal history of other diseases of the nervous system and sense organs: Secondary | ICD-10-CM | POA: Diagnosis not present

## 2015-03-28 DIAGNOSIS — K219 Gastro-esophageal reflux disease without esophagitis: Secondary | ICD-10-CM | POA: Diagnosis not present

## 2015-03-28 DIAGNOSIS — R0602 Shortness of breath: Secondary | ICD-10-CM | POA: Diagnosis not present

## 2015-03-28 LAB — CBC WITH DIFFERENTIAL/PLATELET
BASOS ABS: 0 10*3/uL (ref 0.0–0.1)
Basophils Relative: 0 %
Eosinophils Absolute: 0.3 10*3/uL (ref 0.0–0.7)
Eosinophils Relative: 4 %
HEMATOCRIT: 40.4 % (ref 39.0–52.0)
HEMOGLOBIN: 13.5 g/dL (ref 13.0–17.0)
LYMPHS PCT: 13 %
Lymphs Abs: 1 10*3/uL (ref 0.7–4.0)
MCH: 31 pg (ref 26.0–34.0)
MCHC: 33.4 g/dL (ref 30.0–36.0)
MCV: 92.7 fL (ref 78.0–100.0)
MONO ABS: 0.5 10*3/uL (ref 0.1–1.0)
Monocytes Relative: 7 %
NEUTROS ABS: 5.6 10*3/uL (ref 1.7–7.7)
NEUTROS PCT: 76 %
PLATELETS: 199 10*3/uL (ref 150–400)
RBC: 4.36 MIL/uL (ref 4.22–5.81)
RDW: 12.6 % (ref 11.5–15.5)
WBC: 7.4 10*3/uL (ref 4.0–10.5)

## 2015-03-28 LAB — URINALYSIS, ROUTINE W REFLEX MICROSCOPIC
Bilirubin Urine: NEGATIVE
GLUCOSE, UA: NEGATIVE mg/dL
Hgb urine dipstick: NEGATIVE
Ketones, ur: NEGATIVE mg/dL
LEUKOCYTES UA: NEGATIVE
Nitrite: NEGATIVE
PROTEIN: NEGATIVE mg/dL
Specific Gravity, Urine: 1.012 (ref 1.005–1.030)
pH: 6.5 (ref 5.0–8.0)

## 2015-03-28 LAB — BASIC METABOLIC PANEL
ANION GAP: 8 (ref 5–15)
BUN: 11 mg/dL (ref 6–20)
CO2: 26 mmol/L (ref 22–32)
Calcium: 8.8 mg/dL — ABNORMAL LOW (ref 8.9–10.3)
Chloride: 96 mmol/L — ABNORMAL LOW (ref 101–111)
Creatinine, Ser: 0.94 mg/dL (ref 0.61–1.24)
GFR calc Af Amer: >60 mL/min (ref >60)
GLUCOSE: 107 mg/dL — AB (ref 65–99)
POTASSIUM: 4.5 mmol/L (ref 3.5–5.1)
Sodium: 130 mmol/L — ABNORMAL LOW (ref 135–145)

## 2015-03-28 LAB — HEPATIC FUNCTION PANEL
ALT: 15 U/L — ABNORMAL LOW (ref 17–63)
AST: 20 U/L (ref 15–41)
Albumin: 3.6 g/dL (ref 3.5–5.0)
Alkaline Phosphatase: 63 U/L (ref 38–126)
BILIRUBIN DIRECT: 0.2 mg/dL (ref 0.1–0.5)
BILIRUBIN INDIRECT: 0.7 mg/dL (ref 0.3–0.9)
Total Bilirubin: 0.9 mg/dL (ref 0.3–1.2)
Total Protein: 6.3 g/dL — ABNORMAL LOW (ref 6.5–8.1)

## 2015-03-28 LAB — BRAIN NATRIURETIC PEPTIDE: B NATRIURETIC PEPTIDE 5: 389.9 pg/mL — AB (ref 0.0–100.0)

## 2015-03-28 LAB — I-STAT TROPONIN, ED: TROPONIN I, POC: 0 ng/mL (ref 0.00–0.08)

## 2015-03-28 MED ORDER — METHYLPREDNISOLONE SODIUM SUCC 125 MG IJ SOLR
125.0000 mg | Freq: Once | INTRAMUSCULAR | Status: AC
  Administered 2015-03-28: 125 mg via INTRAVENOUS
  Filled 2015-03-28: qty 2

## 2015-03-28 MED ORDER — AZITHROMYCIN 250 MG PO TABS: ORAL_TABLET | ORAL | Status: DC

## 2015-03-28 MED ORDER — IPRATROPIUM-ALBUTEROL 0.5-2.5 (3) MG/3ML IN SOLN
3.0000 mL | Freq: Once | RESPIRATORY_TRACT | Status: AC
  Administered 2015-03-28: 3 mL via RESPIRATORY_TRACT
  Filled 2015-03-28: qty 3

## 2015-03-28 MED ORDER — ALBUTEROL SULFATE (2.5 MG/3ML) 0.083% IN NEBU
2.5000 mg | INHALATION_SOLUTION | Freq: Once | RESPIRATORY_TRACT | Status: AC
  Administered 2015-03-28: 2.5 mg via RESPIRATORY_TRACT
  Filled 2015-03-28: qty 3

## 2015-03-28 MED ORDER — PREDNISONE 10 MG PO TABS: 20.0000 mg | ORAL_TABLET | Freq: Every day | ORAL | Status: DC

## 2015-03-28 NOTE — ED Notes (Signed)
Pt c/o awakening from sleep today with SOB, pt reports being here for SOB on Weds & received a breathing tx & ABX, pt being compliant with ABX, pt reports dry cough, pt has dusky fingernails, pt speaking in complete sentences, pt reports hx of A fib, pt takes Coumadin, A&O x4

## 2015-03-28 NOTE — ED Notes (Signed)
Patient transported to X-ray 

## 2015-03-28 NOTE — Discharge Instructions (Signed)
Stop taking the doxycycline. Follow-up with your family doctor in 2-3 days

## 2015-03-28 NOTE — ED Provider Notes (Signed)
CSN: [id number]     Arrival date & time 03/28/15  0745 History   First MD Initiated Contact with Patient 03/28/15 0802     Chief Complaint  Patient presents with  . Shortness of Breath     (Consider location/radiation/quality/duration/timing/severity/associated sxs/prior Treatment) Patient is a 82 y.o. male presenting with shortness of breath. The history is provided by the patient (Patient complains of cough and shortness of breath he is benign Levaquin and now doxycycline. He states the doxycycline is giving him indigestion).  Shortness of Breath Severity:  Moderate Onset quality:  Sudden Timing:  Constant Progression:  Waxing and waning Chronicity:  Recurrent Context: activity   Associated symptoms: wheezing   Associated symptoms: no abdominal pain, no chest pain, no cough, no headaches and no rash     Past Medical History  Diagnosis Date  . Ischemic cardiomyopathy 05/2010    Has EF of 25%  . Diverticulitis     CURRENTLY CONTROLLED WITH NO EVIDENCE OF RECURRENT INFECTION  . AF (atrial fibrillation) ([place])     Has not tolerated amiodarone in the past. Amiodarone was stopped in September of 2010 due to side effects  . Chronic anticoagulation     on coumadin  . Prostate cancer ([place])   . Osteoarthritis     RIGHT KNEE  . Aortic stenosis, severe     WITH PERCUTANEOUS AORTIC VALVE (TAVI) IN March 2012 at the [facility]  . MI (myocardial infarction) ([place]) 1974, 1978  . Cervical myelopathy ([place])   . Pulmonary embolism ([place])   . Hyperlipidemia   . GERD (gastroesophageal reflux disease)   . Esophageal stricture     WITH DILATATION  . NSVT (nonsustained ventricular tachycardia) ([place])   . Coronary artery disease   . Heart murmur   . Osteoporosis   . Stroke [facility])     3 strokes   Past Surgical History  Procedure Laterality Date  . Icd  Feb 2003; 02/2013    gen change 02-11-2013 by Dr Caryl Comes  . Coronary artery bypass graft  1979  . Coronary artery bypass graft  1991     REDO SURGERY  . Cardiac catheterization  2010    SEVERE LV DYSFUNCTION WITH ESTIMATED EJECTION FRACTION OF 25%  . Cholecystectomy    . Aortic valve replacement      Percutaneous AVR in March 2012 at the [facility]  . Cardioversion  02/16/2011    Procedure: CARDIOVERSION;  Surgeon: Carlena Bjornstad, MD;  Location: [place];  Service: Cardiovascular;  Laterality: N/A;  . Shoulder surgery    . Back surgery    . Implantable cardioverter defibrillator (icd) generator change N/A 02/10/2013    Procedure: ICD GENERATOR CHANGE;  Surgeon: Deboraha Sprang, MD;  Location: [facility] CATH LAB;  Service: Cardiovascular;  Laterality: N/A;   Family History  Problem Relation Age of Onset  . Heart disease Mother 30  . Diabetes Mother 46  . Heart disease Father 27   Social History  Substance Use Topics  . Smoking status: Former Smoker -- 4.00 packs/day for 15 years    Types: Cigarettes    Quit date: 06/15/1969  . Smokeless tobacco: Never Used  . Alcohol Use: No    Review of Systems  Constitutional: Negative for appetite change and fatigue.  HENT: Negative for congestion, ear discharge and sinus pressure.   Eyes: Negative for discharge.  Respiratory: Positive for shortness of breath and wheezing. Negative for cough.   Cardiovascular: Negative for chest pain.  Gastrointestinal:  Negative for abdominal pain and diarrhea.  Genitourinary: Negative for frequency and hematuria.  Musculoskeletal: Negative for back pain.  Skin: Negative for rash.  Neurological: Negative for seizures and headaches.  Psychiatric/Behavioral: Negative for hallucinations.      Allergies  Penicillins; Tizanidine; Ace inhibitors; Antihistamines, diphenhydramine-type; and Amiodarone  Home Medications   Prior to Admission medications   Medication Sig Start Date End Date Taking? Authorizing Provider  acetaminophen (TYLENOL) 500 MG tablet Take 1,000 mg by mouth every 6 (six) hours as needed.   Yes Historical Provider, MD   alfuzosin (UROXATRAL) 10 MG 24 hr tablet Take 10 mg by mouth at bedtime.    Yes Historical Provider, MD  atorvastatin (LIPITOR) 10 MG tablet TAKE ONE TABLET EVERY OTHER DAY. 07/28/14  Yes Sherren Mocha, MD  Budesonide-Formoterol Fumarate (SYMBICORT IN) Inhale 1 puff into the lungs 2 (two) times daily.   Yes Historical Provider, MD  calcium carbonate (OS-CAL) 600 MG TABS tablet Take 600 mg by mouth 2 (two) times daily with a meal. 600 +D   Yes Historical Provider, MD  carvedilol (COREG) 6.25 MG tablet TAKE 1 TABLET TWICE DAILY WITH A MEAL. 09/01/14  Yes Lelon Perla, MD  cholecalciferol (VITAMIN D) 1000 UNITS tablet Take 1,000 Units by mouth 2 (two) times daily.   Yes Historical Provider, MD  COUMADIN 5 MG tablet TAKE AS DIRECTED BY COUMADIN CLINIC. Patient taking differently: TAKE 2.5MG  ON MONDAY AND 5MG  ALL OTHER DAYS OF THE WEEK 12/17/14  Yes Sherren Mocha, MD  doxycycline (VIBRAMYCIN) 100 MG capsule Take 1 capsule (100 mg total) by mouth 2 (two) times daily. 03/24/15  Yes Virginia Crews, MD  furosemide (LASIX) 20 MG tablet Take 1 tablet (20 mg total) by mouth daily as needed (for swelling). 03/19/15  Yes Sherren Mocha, MD  HYDROcodone-acetaminophen (NORCO/VICODIN) 5-325 MG per tablet Take 1 tablet by mouth every 6 (six) hours as needed for moderate pain. 01/20/14  Yes Jessica U Vann, DO  ipratropium (ATROVENT) 0.03 % nasal spray Place 2 sprays into both nostrils 2 (two) times daily.   Yes Historical Provider, MD  levothyroxine (SYNTHROID, LEVOTHROID) 150 MCG tablet Take 150 mcg by mouth daily before breakfast.   Yes Historical Provider, MD  NITROSTAT 0.4 MG SL tablet DISSOLVE 1 TABLET UNDER TONGUE AS NEEDED FOR CHEST PAIN,MAY REPEAT IN5 MINUTES FOR 2 DOSES. 08/26/14  Yes Sherren Mocha, MD  omeprazole (PRILOSEC) 20 MG capsule Take 20 mg by mouth daily as needed (acid reflux).   Yes Historical Provider, MD  polyethylene glycol (MIRALAX / GLYCOLAX) packet Take 17 g by mouth daily as  needed. Patient taking differently: Take 17 g by mouth daily as needed for mild constipation.  11/25/14  Yes Everlene Balls, MD  Probiotic Product (PROBIOTIC DAILY PO) Take 1 capsule by mouth daily.   Yes Historical Provider, MD  RESTASIS 0.05 % ophthalmic emulsion Place 1 drop into both eyes every 12 (twelve) hours.  12/05/11  Yes Historical Provider, MD  azithromycin (ZITHROMAX Z-PAK) 250 MG tablet 2 po day one, then 1 daily x 4 days 03/28/15   Milton Ferguson, MD  levofloxacin (LEVAQUIN) 250 MG tablet Take 1 tablet (250 mg total) by mouth daily. 03/19/15   Sherren Mocha, MD  predniSONE (DELTASONE) 10 MG tablet Take 2 tablets (20 mg total) by mouth daily. 03/28/15   Milton Ferguson, MD   BP 125/75 mmHg  Pulse 53  Temp(Src) 97.3 F (36.3 C) (Oral)  Resp 11  SpO2 100% Physical Exam  Constitutional: He  is oriented to person, place, and time. He appears well-developed.  HENT:  Head: Normocephalic.  Eyes: Conjunctivae and EOM are normal. No scleral icterus.  Neck: Neck supple. No thyromegaly present.  Cardiovascular: Normal rate and regular rhythm.  Exam reveals no gallop and no friction rub.   No murmur heard. Pulmonary/Chest: No stridor. He has wheezes. He has no rales. He exhibits no tenderness.  Abdominal: He exhibits no distension. There is no tenderness. There is no rebound.  Musculoskeletal: Normal range of motion. He exhibits no edema.  Lymphadenopathy:    He has no cervical adenopathy.  Neurological: He is oriented to person, place, and time. He exhibits normal muscle tone. Coordination normal.  Skin: No rash noted. No erythema.  Psychiatric: He has a normal mood and affect. His behavior is normal.    ED Course  Procedures (including critical care time) Labs Review Labs Reviewed  BRAIN NATRIURETIC PEPTIDE - Abnormal; Notable for the following:    B Natriuretic Peptide 389.9 (*)    All other components within normal limits  BASIC METABOLIC PANEL - Abnormal; Notable for the following:     Sodium 130 (*)    Chloride 96 (*)    Glucose, Bld 107 (*)    Calcium 8.8 (*)    All other components within normal limits  HEPATIC FUNCTION PANEL - Abnormal; Notable for the following:    Total Protein 6.3 (*)    ALT 15 (*)    All other components within normal limits  URINALYSIS, ROUTINE W REFLEX MICROSCOPIC (NOT AT [facility])  CBC WITH DIFFERENTIAL/PLATELET  Randolm Idol, ED    Imaging Review Dg Chest 2 View  03/28/2015  CLINICAL DATA:  Shortness of breath with productive cough. EXAM: CHEST  2 VIEW COMPARISON:  03/24/2015 FINDINGS: Lordotic technique is demonstrated. Sternotomy wires and left-sided pacemaker unchanged. Lungs are hypoinflated without focal consolidation or effusion. Mild flattening of the hemidiaphragms on the lateral film. Mild cardiomegaly. Mild calcified plaque over the thoracic aorta. Degenerative changes of spine. Remainder of the exam is unchanged. IMPRESSION: Hypoinflation without acute cardiopulmonary disease. Mild stable cardiomegaly. Electronically Signed   By: Marin Olp M.D.   On: 03/28/2015 09:57   I have personally reviewed and evaluated these images and lab results as part of my medical decision-making.   EKG Interpretation   Date/Time:  Sunday March 28 2015 07:49:02 EST Ventricular Rate:  66 PR Interval:    QRS Duration: 116 QT Interval:  408 QTC Calculation: 427 R Axis:   81 Text Interpretation:  Atrial fibrillation Anterior infarct , age  undetermined Abnormal ECG Confirmed by Lydon Vansickle  MD, Denzil Mceachron [phone]) on  03/28/2015 9:58:29 AM      MDM   Final diagnoses:  Bronchospasm  Bronchitis    Chest x-ray shows no pneumonia. Patient improved with neb treatment. Will change the antibiotic to a Z-Pak since the patient is not tolerating the doxycycline and will start some prednisone and will have him follow-up with his PCP this week    Milton Ferguson, MD 03/28/15 1130

## 2015-03-28 NOTE — ED Notes (Signed)
Pt reports taking Tizanidine 4mg  for muscle spasms in the R hip, pt reports double vision, feeling light headed, shaky & anxious with first med admin yesterday, pt taking Doxycycline100 mg tab BID

## 2015-03-30 ENCOUNTER — Encounter: Payer: Self-pay | Admitting: Cardiovascular Disease

## 2015-03-30 ENCOUNTER — Ambulatory Visit (INDEPENDENT_AMBULATORY_CARE_PROVIDER_SITE_OTHER): Payer: Medicare Other | Admitting: Internal Medicine

## 2015-03-30 DIAGNOSIS — I482 Chronic atrial fibrillation, unspecified: Secondary | ICD-10-CM

## 2015-03-30 DIAGNOSIS — Z5181 Encounter for therapeutic drug level monitoring: Secondary | ICD-10-CM

## 2015-03-30 LAB — POCT INR: INR: 3.3

## 2015-03-31 ENCOUNTER — Other Ambulatory Visit: Payer: Self-pay | Admitting: Cardiology

## 2015-03-31 NOTE — Telephone Encounter (Signed)
Rx request sent to pharmacy.  

## 2015-04-02 DIAGNOSIS — Z6823 Body mass index (BMI) 23.0-23.9, adult: Secondary | ICD-10-CM | POA: Diagnosis not present

## 2015-04-02 DIAGNOSIS — R062 Wheezing: Secondary | ICD-10-CM | POA: Diagnosis not present

## 2015-04-02 DIAGNOSIS — J209 Acute bronchitis, unspecified: Secondary | ICD-10-CM | POA: Diagnosis not present

## 2015-04-02 DIAGNOSIS — R197 Diarrhea, unspecified: Secondary | ICD-10-CM | POA: Diagnosis not present

## 2015-04-02 DIAGNOSIS — R05 Cough: Secondary | ICD-10-CM | POA: Diagnosis not present

## 2015-04-06 ENCOUNTER — Ambulatory Visit (INDEPENDENT_AMBULATORY_CARE_PROVIDER_SITE_OTHER): Payer: Medicare Other | Admitting: Pharmacist

## 2015-04-06 DIAGNOSIS — R197 Diarrhea, unspecified: Secondary | ICD-10-CM | POA: Diagnosis not present

## 2015-04-06 DIAGNOSIS — I482 Chronic atrial fibrillation, unspecified: Secondary | ICD-10-CM

## 2015-04-06 DIAGNOSIS — Z5181 Encounter for therapeutic drug level monitoring: Secondary | ICD-10-CM

## 2015-04-06 LAB — POCT INR: INR: 4

## 2015-04-09 ENCOUNTER — Ambulatory Visit (INDEPENDENT_AMBULATORY_CARE_PROVIDER_SITE_OTHER): Payer: Medicare Other | Admitting: Cardiovascular Disease

## 2015-04-09 DIAGNOSIS — I482 Chronic atrial fibrillation, unspecified: Secondary | ICD-10-CM

## 2015-04-09 DIAGNOSIS — Z5181 Encounter for therapeutic drug level monitoring: Secondary | ICD-10-CM

## 2015-04-09 LAB — POCT INR: INR: 3.8

## 2015-04-12 ENCOUNTER — Ambulatory Visit (INDEPENDENT_AMBULATORY_CARE_PROVIDER_SITE_OTHER): Payer: Medicare Other | Admitting: *Deleted

## 2015-04-12 DIAGNOSIS — I5022 Chronic systolic (congestive) heart failure: Secondary | ICD-10-CM

## 2015-04-12 DIAGNOSIS — Z95810 Presence of automatic (implantable) cardiac defibrillator: Secondary | ICD-10-CM

## 2015-04-12 DIAGNOSIS — I255 Ischemic cardiomyopathy: Secondary | ICD-10-CM

## 2015-04-12 NOTE — Progress Notes (Signed)
Remote ICD transmission.   

## 2015-04-13 DIAGNOSIS — C61 Malignant neoplasm of prostate: Secondary | ICD-10-CM | POA: Diagnosis not present

## 2015-04-13 DIAGNOSIS — R300 Dysuria: Secondary | ICD-10-CM | POA: Diagnosis not present

## 2015-04-13 DIAGNOSIS — N320 Bladder-neck obstruction: Secondary | ICD-10-CM | POA: Diagnosis not present

## 2015-04-14 ENCOUNTER — Ambulatory Visit (INDEPENDENT_AMBULATORY_CARE_PROVIDER_SITE_OTHER): Payer: Medicare Other | Admitting: Pharmacist

## 2015-04-14 ENCOUNTER — Encounter: Payer: Self-pay | Admitting: *Deleted

## 2015-04-14 DIAGNOSIS — Z5181 Encounter for therapeutic drug level monitoring: Secondary | ICD-10-CM | POA: Diagnosis not present

## 2015-04-14 DIAGNOSIS — E784 Other hyperlipidemia: Secondary | ICD-10-CM | POA: Diagnosis not present

## 2015-04-14 DIAGNOSIS — E038 Other specified hypothyroidism: Secondary | ICD-10-CM | POA: Diagnosis not present

## 2015-04-14 DIAGNOSIS — I482 Chronic atrial fibrillation, unspecified: Secondary | ICD-10-CM

## 2015-04-14 DIAGNOSIS — R739 Hyperglycemia, unspecified: Secondary | ICD-10-CM | POA: Diagnosis not present

## 2015-04-14 DIAGNOSIS — E559 Vitamin D deficiency, unspecified: Secondary | ICD-10-CM | POA: Diagnosis not present

## 2015-04-14 DIAGNOSIS — I2510 Atherosclerotic heart disease of native coronary artery without angina pectoris: Secondary | ICD-10-CM | POA: Diagnosis not present

## 2015-04-14 DIAGNOSIS — Z125 Encounter for screening for malignant neoplasm of prostate: Secondary | ICD-10-CM | POA: Diagnosis not present

## 2015-04-14 LAB — POCT INR: INR: 2.9

## 2015-04-14 NOTE — Progress Notes (Signed)
EPIC Encounter for ICM Monitoring  Patient Name: [name] is a 82 y.o. male Date: 04/14/2015 Primary Care Physican: [name], MD Primary Cardiologist: [place] Electrophysiologist: Caryl Comes Dry Weight: 180.4 lb       In the past month, have you:  1. Gained more than 2 pounds in a day or more than 5 pounds in a week? no  2. Had changes in your medications (with verification of current medications)? no  3. Had more shortness of breath than is usual for you? no  4. Limited your activity because of shortness of breath? no  5. Not been able to sleep because of shortness of breath? no  6. Had increased swelling in your feet or ankles? no  7. Had symptoms of dehydration (dizziness, dry mouth, increased thirst, decreased urine output) no  8. Had changes in sodium restriction? no  9. Been compliant with medication? Yes   ICM trend: 3 month view for 04/12/2015   ICM trend: 1 year view for 04/12/2015   Follow-up plan: ICM clinic phone appointment on 05/24/2015.  Optivol thoracic impedance trending along reference line.  Patient denied any current fluid retention symptoms.  He visited ER on 03/24/2015 and dx with respiratory infection.  He was given antibiotic and is feeling much better.  He has a physical scheduled with Dr Virgina Jock next week.  Encouraged to call if he has any fluid retention symptoms.  No changes today.   Copy of note sent to patient's primary care physician, primary cardiologist, and device following physician.  Rosalene Billings, RN, CCM 04/14/2015 8:50 AM

## 2015-04-15 ENCOUNTER — Other Ambulatory Visit: Payer: Self-pay | Admitting: Cardiovascular Disease

## 2015-04-16 DIAGNOSIS — H2513 Age-related nuclear cataract, bilateral: Secondary | ICD-10-CM | POA: Diagnosis not present

## 2015-04-19 ENCOUNTER — Encounter: Payer: Self-pay | Admitting: Internal Medicine

## 2015-04-20 DIAGNOSIS — M1990 Unspecified osteoarthritis, unspecified site: Secondary | ICD-10-CM | POA: Diagnosis not present

## 2015-04-20 DIAGNOSIS — D692 Other nonthrombocytopenic purpura: Secondary | ICD-10-CM | POA: Diagnosis not present

## 2015-04-20 DIAGNOSIS — Z7901 Long term (current) use of anticoagulants: Secondary | ICD-10-CM | POA: Diagnosis not present

## 2015-04-20 DIAGNOSIS — M810 Age-related osteoporosis without current pathological fracture: Secondary | ICD-10-CM | POA: Diagnosis not present

## 2015-04-20 DIAGNOSIS — I252 Old myocardial infarction: Secondary | ICD-10-CM | POA: Diagnosis not present

## 2015-04-20 DIAGNOSIS — Z6824 Body mass index (BMI) 24.0-24.9, adult: Secondary | ICD-10-CM | POA: Diagnosis not present

## 2015-04-20 DIAGNOSIS — R739 Hyperglycemia, unspecified: Secondary | ICD-10-CM | POA: Diagnosis not present

## 2015-04-20 DIAGNOSIS — Z1389 Encounter for screening for other disorder: Secondary | ICD-10-CM | POA: Diagnosis not present

## 2015-04-20 DIAGNOSIS — R197 Diarrhea, unspecified: Secondary | ICD-10-CM | POA: Diagnosis not present

## 2015-04-20 DIAGNOSIS — Z0000 Encounter for general adult medical examination without abnormal findings: Secondary | ICD-10-CM | POA: Diagnosis not present

## 2015-04-20 DIAGNOSIS — E784 Other hyperlipidemia: Secondary | ICD-10-CM | POA: Diagnosis not present

## 2015-04-20 DIAGNOSIS — R2689 Other abnormalities of gait and mobility: Secondary | ICD-10-CM | POA: Diagnosis not present

## 2015-04-20 LAB — CUP PACEART REMOTE DEVICE CHECK
Battery Remaining Longevity: 122 mo
Battery Voltage: 3 V
Brady Statistic RV Percent Paced: 3.94 %
Date Time Interrogation Session: 20170130102606
HIGH POWER IMPEDANCE MEASURED VALUE: 43 Ohm
HIGH POWER IMPEDANCE MEASURED VALUE: 53 Ohm
Implantable Lead Location: 753860
Lead Channel Impedance Value: 456 Ohm
Lead Channel Impedance Value: 551 Ohm
Lead Channel Sensing Intrinsic Amplitude: 17.125 mV
Lead Channel Sensing Intrinsic Amplitude: 17.125 mV
Lead Channel Setting Pacing Amplitude: 2 V
Lead Channel Setting Sensing Sensitivity: 0.3 mV
MDC IDC LEAD IMPLANT DT: 20021220
MDC IDC LEAD MODEL: 6944
MDC IDC MSMT LEADCHNL RV PACING THRESHOLD AMPLITUDE: 1 V
MDC IDC MSMT LEADCHNL RV PACING THRESHOLD PULSEWIDTH: 0.4 ms
MDC IDC SET LEADCHNL RV PACING PULSEWIDTH: 0.4 ms

## 2015-04-23 ENCOUNTER — Encounter: Payer: Self-pay | Admitting: Cardiology

## 2015-04-28 ENCOUNTER — Ambulatory Visit (INDEPENDENT_AMBULATORY_CARE_PROVIDER_SITE_OTHER): Payer: Medicare Other | Admitting: Cardiology

## 2015-04-28 DIAGNOSIS — I4891 Unspecified atrial fibrillation: Secondary | ICD-10-CM | POA: Diagnosis not present

## 2015-04-28 DIAGNOSIS — Z7901 Long term (current) use of anticoagulants: Secondary | ICD-10-CM | POA: Diagnosis not present

## 2015-04-28 DIAGNOSIS — I482 Chronic atrial fibrillation, unspecified: Secondary | ICD-10-CM

## 2015-04-28 DIAGNOSIS — Z5181 Encounter for therapeutic drug level monitoring: Secondary | ICD-10-CM

## 2015-04-28 LAB — POCT INR: INR: 2.4

## 2015-04-30 ENCOUNTER — Telehealth: Payer: Self-pay | Admitting: Internal Medicine

## 2015-04-30 NOTE — Telephone Encounter (Signed)
New message     Patient receive a letter regarding his device - Patient is aware of upcoming appt in March .

## 2015-04-30 NOTE — Telephone Encounter (Signed)
Informed pt that he needed appt w/ MD. Pt agreed to appt on 07-01-2015 at 2:00 PM w/ MD. Informed pt that the letter has changed and that is why it doesn't give results anymore. Pt verbalized understanding.

## 2015-05-05 DIAGNOSIS — M810 Age-related osteoporosis without current pathological fracture: Secondary | ICD-10-CM | POA: Diagnosis not present

## 2015-05-07 DIAGNOSIS — M25561 Pain in right knee: Secondary | ICD-10-CM | POA: Diagnosis not present

## 2015-05-10 LAB — POCT INR: INR: 3.3

## 2015-05-11 ENCOUNTER — Ambulatory Visit (INDEPENDENT_AMBULATORY_CARE_PROVIDER_SITE_OTHER): Payer: Medicare Other

## 2015-05-11 DIAGNOSIS — I482 Chronic atrial fibrillation, unspecified: Secondary | ICD-10-CM

## 2015-05-11 DIAGNOSIS — Z5181 Encounter for therapeutic drug level monitoring: Secondary | ICD-10-CM

## 2015-05-12 DIAGNOSIS — K642 Third degree hemorrhoids: Secondary | ICD-10-CM | POA: Diagnosis not present

## 2015-05-17 ENCOUNTER — Ambulatory Visit (INDEPENDENT_AMBULATORY_CARE_PROVIDER_SITE_OTHER): Payer: Medicare Other | Admitting: Interventional Cardiology

## 2015-05-17 DIAGNOSIS — Z5181 Encounter for therapeutic drug level monitoring: Secondary | ICD-10-CM

## 2015-05-17 DIAGNOSIS — I482 Chronic atrial fibrillation, unspecified: Secondary | ICD-10-CM

## 2015-05-17 LAB — POCT INR: INR: 2.9

## 2015-05-24 ENCOUNTER — Ambulatory Visit (INDEPENDENT_AMBULATORY_CARE_PROVIDER_SITE_OTHER): Payer: Medicare Other

## 2015-05-24 DIAGNOSIS — Z95810 Presence of automatic (implantable) cardiac defibrillator: Secondary | ICD-10-CM

## 2015-05-24 DIAGNOSIS — I5022 Chronic systolic (congestive) heart failure: Secondary | ICD-10-CM

## 2015-05-24 NOTE — Progress Notes (Signed)
EPIC Encounter for ICM Monitoring  Patient Name: [name] is a 82 y.o. male Date: 05/24/2015 Primary Care Physican: [name], MD Primary Cardiologist: Burt Knack Electrophysiologist: Allred Dry Weight: 179 lbs   In the past month, have you:  1. Gained more than 2 pounds in a day or more than 5 pounds in a week? no  2. Had changes in your medications (with verification of current medications)? no  3. Had more shortness of breath than is usual for you? no  4. Limited your activity because of shortness of breath? no  5. Not been able to sleep because of shortness of breath? no  6. Had increased swelling in your feet or ankles? no  7. Had symptoms of dehydration (dizziness, dry mouth, increased thirst, decreased urine output) no  8. Had changes in sodium restriction? no  9. Been compliant with medication? Yes   ICM trend: 3 month view for 05/24/2015   ICM trend: 1 year view for 05/24/2015   Follow-up plan: ICM clinic phone appointment on 08/02/2015.  Thoracic impedance trending along reference line suggesting fluid levels are stable.  Patient denied any fluid symptoms and encouraged to call should he have any which were reviewed with patient.  No changes today.    Rosalene Billings, RN, CCM 05/24/2015 10:15 AM

## 2015-05-27 DIAGNOSIS — K644 Residual hemorrhoidal skin tags: Secondary | ICD-10-CM | POA: Diagnosis not present

## 2015-05-27 DIAGNOSIS — K642 Third degree hemorrhoids: Secondary | ICD-10-CM | POA: Diagnosis not present

## 2015-05-27 DIAGNOSIS — L290 Pruritus ani: Secondary | ICD-10-CM | POA: Diagnosis not present

## 2015-05-31 ENCOUNTER — Ambulatory Visit (INDEPENDENT_AMBULATORY_CARE_PROVIDER_SITE_OTHER): Payer: Medicare Other | Admitting: Cardiovascular Disease

## 2015-05-31 DIAGNOSIS — Z5181 Encounter for therapeutic drug level monitoring: Secondary | ICD-10-CM

## 2015-05-31 DIAGNOSIS — I482 Chronic atrial fibrillation, unspecified: Secondary | ICD-10-CM

## 2015-05-31 LAB — POCT INR: INR: 3.8

## 2015-06-07 ENCOUNTER — Ambulatory Visit (INDEPENDENT_AMBULATORY_CARE_PROVIDER_SITE_OTHER): Payer: Medicare Other | Admitting: Pharmacist

## 2015-06-07 DIAGNOSIS — I482 Chronic atrial fibrillation, unspecified: Secondary | ICD-10-CM

## 2015-06-07 DIAGNOSIS — Z5181 Encounter for therapeutic drug level monitoring: Secondary | ICD-10-CM

## 2015-06-07 LAB — POCT INR: INR: 3.1

## 2015-06-08 DIAGNOSIS — H1132 Conjunctival hemorrhage, left eye: Secondary | ICD-10-CM | POA: Diagnosis not present

## 2015-06-14 ENCOUNTER — Ambulatory Visit (INDEPENDENT_AMBULATORY_CARE_PROVIDER_SITE_OTHER): Payer: Medicare Other | Admitting: Internal Medicine

## 2015-06-14 DIAGNOSIS — I482 Chronic atrial fibrillation, unspecified: Secondary | ICD-10-CM

## 2015-06-14 DIAGNOSIS — Z5181 Encounter for therapeutic drug level monitoring: Secondary | ICD-10-CM

## 2015-06-14 DIAGNOSIS — Z7901 Long term (current) use of anticoagulants: Secondary | ICD-10-CM | POA: Diagnosis not present

## 2015-06-14 DIAGNOSIS — I4891 Unspecified atrial fibrillation: Secondary | ICD-10-CM | POA: Diagnosis not present

## 2015-06-14 LAB — POCT INR: INR: 3.2

## 2015-06-21 ENCOUNTER — Ambulatory Visit (INDEPENDENT_AMBULATORY_CARE_PROVIDER_SITE_OTHER): Payer: Medicare Other | Admitting: Pharmacist

## 2015-06-21 DIAGNOSIS — I482 Chronic atrial fibrillation, unspecified: Secondary | ICD-10-CM

## 2015-06-21 DIAGNOSIS — Z5181 Encounter for therapeutic drug level monitoring: Secondary | ICD-10-CM

## 2015-06-21 LAB — POCT INR: INR: 2.6

## 2015-06-24 ENCOUNTER — Other Ambulatory Visit: Payer: Self-pay | Admitting: Cardiovascular Disease

## 2015-06-25 DIAGNOSIS — R300 Dysuria: Secondary | ICD-10-CM | POA: Diagnosis not present

## 2015-06-25 DIAGNOSIS — C61 Malignant neoplasm of prostate: Secondary | ICD-10-CM | POA: Diagnosis not present

## 2015-06-25 DIAGNOSIS — R350 Frequency of micturition: Secondary | ICD-10-CM | POA: Diagnosis not present

## 2015-06-28 ENCOUNTER — Ambulatory Visit (INDEPENDENT_AMBULATORY_CARE_PROVIDER_SITE_OTHER): Payer: Medicare Other | Admitting: Cardiovascular Disease

## 2015-06-28 DIAGNOSIS — I482 Chronic atrial fibrillation, unspecified: Secondary | ICD-10-CM

## 2015-06-28 DIAGNOSIS — Z5181 Encounter for therapeutic drug level monitoring: Secondary | ICD-10-CM

## 2015-06-28 LAB — POCT INR: INR: 2.2

## 2015-06-30 NOTE — Progress Notes (Signed)
Patient Care Team: [name], MD as PCP - General (Internal Medicine)   HPI  [name] is a 82 y.o. male seen in followup for ischemic cardiomyopathy for which he underwent ICD implantation for primary prevention. He has had appropriate therapy for ventricular tachycardia. He underwent generator 12/14 He is status post bypass surgery.  He alo underwent percutaneous aortic valve replacement.with some perivalvular AI LVEDD increased and ARB added 9/15; he was unable to tolerate this.   He also has a history of atrial fibrillation for which amiodarone was initiated. He was intolerant of this and this has been discontinued. His atrial fibrillation is now permanent. He is on warfarin  He is status post TAVR; his back pain limits his activity. He denies exertional shortness of breath chest pain or peripheral edema          Past Medical History  Diagnosis Date  . Ischemic cardiomyopathy 05/2010    Has EF of 25%  . Diverticulitis     CURRENTLY CONTROLLED WITH NO EVIDENCE OF RECURRENT INFECTION  . AF (atrial fibrillation) ([place])     Has not tolerated amiodarone in the past. Amiodarone was stopped in September of 2010 due to side effects  . Chronic anticoagulation     on coumadin  . Prostate cancer ([place])   . Osteoarthritis     RIGHT KNEE  . Aortic stenosis, severe     WITH PERCUTANEOUS AORTIC VALVE (TAVI) IN March 2012 at the [facility]  . MI (myocardial infarction) ([place]) 1974, 1978  . Cervical myelopathy ([place])   . Pulmonary embolism ([place])   . Hyperlipidemia   . GERD (gastroesophageal reflux disease)   . Esophageal stricture     WITH DILATATION  . NSVT (nonsustained ventricular tachycardia) ([place])   . Coronary artery disease   . Heart murmur   . Osteoporosis   . Stroke [facility])     3 strokes    Past Surgical History  Procedure Laterality Date  . Icd  Feb 2003; 02/2013    gen change 02-11-2013 by Dr Caryl Comes  . Coronary artery bypass graft  1979  . Coronary artery bypass  graft  1991    REDO SURGERY  . Cardiac catheterization  2010    SEVERE LV DYSFUNCTION WITH ESTIMATED EJECTION FRACTION OF 25%  . Cholecystectomy    . Aortic valve replacement      Percutaneous [facility]R in March 2012 at the [facility]  . Cardioversion  02/16/2011    Procedure: CARDIOVERSION;  Surgeon: Carlena Bjornstad, MD;  Location: [place];  Service: Cardiovascular;  Laterality: N/A;  . Shoulder surgery    . Back surgery    . Implantable cardioverter defibrillator (icd) generator change N/A 02/10/2013    Procedure: ICD GENERATOR CHANGE;  Surgeon: Deboraha Sprang, MD;  Location: [facility] CATH LAB;  Service: Cardiovascular;  Laterality: N/A;    Current Outpatient Prescriptions  Medication Sig Dispense Refill  . acetaminophen (TYLENOL) 500 MG tablet Take 1,000 mg by mouth every 6 (six) hours as needed.    Marland Kitchen alfuzosin (UROXATRAL) 10 MG 24 hr tablet Take 10 mg by mouth at bedtime.     Marland Kitchen atorvastatin (LIPITOR) 10 MG tablet TAKE ONE TABLET EVERY OTHER DAY. 45 tablet 2  . azithromycin (ZITHROMAX Z-PAK) 250 MG tablet 2 po day one, then 1 daily x 4 days 5 tablet 0  . Budesonide-Formoterol Fumarate (SYMBICORT IN) Inhale 1 puff into the lungs 2 (two) times daily.    . calcium carbonate (OS-CAL)  600 MG TABS tablet Take 600 mg by mouth 2 (two) times daily with a meal. 600 +D    . carvedilol (COREG) 6.25 MG tablet TAKE 1 TABLET TWICE DAILY WITH A MEAL. 60 tablet 3  . cholecalciferol (VITAMIN D) 1000 UNITS tablet Take 1,000 Units by mouth 2 (two) times daily.    Marland Kitchen COUMADIN 5 MG tablet TAKE AS DIRECTED BY COUMADIN CLINIC. 90 tablet 1  . furosemide (LASIX) 20 MG tablet Take 1 tablet (20 mg total) by mouth daily as needed (for swelling). 30 tablet 3  . HYDROcodone-acetaminophen (NORCO/VICODIN) 5-325 MG per tablet Take 1 tablet by mouth every 6 (six) hours as needed for moderate pain. 30 tablet 0  . ipratropium (ATROVENT) 0.03 % nasal spray Place 2 sprays into both nostrils 2 (two) times daily.    Marland Kitchen levothyroxine  (SYNTHROID, LEVOTHROID) 150 MCG tablet Take 150 mcg by mouth daily before breakfast.    . NITROSTAT 0.4 MG SL tablet DISSOLVE 1 TABLET UNDER TONGUE AS NEEDED FOR CHEST PAIN,MAY REPEAT IN5 MINUTES FOR 2 DOSES. 25 tablet 0  . omeprazole (PRILOSEC) 20 MG capsule Take 20 mg by mouth daily as needed (acid reflux).    . polyethylene glycol (MIRALAX / GLYCOLAX) packet Take 17 g by mouth daily as needed. (Patient taking differently: Take 17 g by mouth daily as needed for mild constipation. ) 10 each 0  . predniSONE (DELTASONE) 10 MG tablet Take 2 tablets (20 mg total) by mouth daily. 15 tablet 0  . Probiotic Product (PROBIOTIC DAILY PO) Take 1 capsule by mouth daily.    . RESTASIS 0.05 % ophthalmic emulsion Place 1 drop into both eyes every 12 (twelve) hours.      No current facility-administered medications for this visit.    Allergies  Allergen Reactions  . Penicillins Shortness Of Breath and Rash    Has patient had a PCN reaction causing immediate rash, facial/tongue/throat swelling, SOB or lightheadedness with hypotension: Yes Has patient had a PCN reaction causing severe rash involving mucus membranes or skin necrosis: No Has patient had a PCN reaction that required hospitalization No Has patient had a PCN reaction occurring within the last 10 years: No If all of the above answers are "NO", then may proceed with Cephalosporin use.   . Tizanidine Shortness Of Breath    Light headed  . Ace Inhibitors     Has not tolerated in the past due to hyperkalemia  . Antihistamines, Diphenhydramine-Type Other (See Comments)    Inhibits urination  . Amiodarone Other (See Comments)    Unknown     Review of Systems negative except from HPI and PMH  Physical Exam There were no vitals taken for this visit. Well developed and well nourished in no acute distress HENT normal E scleral and icterus clear Neck Supple JVP flat; carotids brisk and full Clear to ausculation  Device pocket well healed;  without hematoma or erythema.  There is no tethering Regular rate and rhythm, 2/6 murmur Soft with active bowel sounds No clubbing cyanosis none Edema Alert and oriented, grossly normal motor and sensory function Skin Warm and Dry  ECG demonstrates atrial fibrillation was infrequent ventricular pacing  Assessment and  Plan  Atrial fibrillation-permanent  Congestive heart failure-chronic-systolic  Implantable defibrillator-[employer]tronic  TAVR

## 2015-07-01 ENCOUNTER — Ambulatory Visit (INDEPENDENT_AMBULATORY_CARE_PROVIDER_SITE_OTHER): Payer: Medicare Other | Admitting: Internal Medicine

## 2015-07-01 ENCOUNTER — Encounter: Payer: Self-pay | Admitting: Internal Medicine

## 2015-07-01 VITALS — BP 118/68 | HR 55 | Ht 75.0 in | Wt 191.8 lb

## 2015-07-01 DIAGNOSIS — I480 Paroxysmal atrial fibrillation: Secondary | ICD-10-CM | POA: Diagnosis not present

## 2015-07-01 DIAGNOSIS — Z95810 Presence of automatic (implantable) cardiac defibrillator: Secondary | ICD-10-CM | POA: Diagnosis not present

## 2015-07-01 DIAGNOSIS — I5022 Chronic systolic (congestive) heart failure: Secondary | ICD-10-CM

## 2015-07-01 NOTE — Patient Instructions (Signed)
Medication Instructions: - Your physician recommends that you continue on your current medications as directed. Please refer to the Current Medication list given to you today.  Labwork: - none  Procedures/Testing: - none  Follow-Up: - Remote monitoring is used to monitor your Pacemaker of ICD from home. This monitoring reduces the number of office visits required to check your device to one time per year. It allows [place] to keep an eye on the functioning of your device to ensure it is working properly. You are scheduled for a device check from home on 09/30/15. You may send your transmission at any time that day. If you have a wireless device, the transmission will be sent automatically. After your physician reviews your transmission, you will receive a postcard with your next transmission date.  - Your physician wants you to follow-up in: 1 year with Dr. Caryl Comes. You will receive a reminder letter in the mail two months in advance. If you don't receive a letter, please call our office to schedule the follow-up appointment.  Any Additional Special Instructions Will Be Listed Below (If Applicable).     If you need a refill on your cardiac medications before your next appointment, please call your pharmacy.

## 2015-07-08 ENCOUNTER — Ambulatory Visit (INDEPENDENT_AMBULATORY_CARE_PROVIDER_SITE_OTHER): Payer: Medicare Other | Admitting: Cardiovascular Disease

## 2015-07-08 DIAGNOSIS — I482 Chronic atrial fibrillation, unspecified: Secondary | ICD-10-CM

## 2015-07-08 DIAGNOSIS — Z5181 Encounter for therapeutic drug level monitoring: Secondary | ICD-10-CM

## 2015-07-08 LAB — POCT INR: INR: 2.2

## 2015-07-09 DIAGNOSIS — L290 Pruritus ani: Secondary | ICD-10-CM | POA: Diagnosis not present

## 2015-07-09 LAB — CUP PACEART INCLINIC DEVICE CHECK
Brady Statistic RV Percent Paced: 5.7 %
HIGH POWER IMPEDANCE MEASURED VALUE: 42 Ohm
Implantable Lead Implant Date: 20021220
Implantable Lead Model: 6944
Lead Channel Impedance Value: 551 Ohm
Lead Channel Pacing Threshold Amplitude: 1 V
Lead Channel Pacing Threshold Pulse Width: 0.4 ms
Lead Channel Sensing Intrinsic Amplitude: 20 mV
Lead Channel Setting Pacing Amplitude: 2 V
MDC IDC LEAD LOCATION: 753860
MDC IDC SESS DTM: 20170428105052
MDC IDC SET LEADCHNL RV PACING PULSEWIDTH: 0.4 ms
MDC IDC SET LEADCHNL RV SENSING SENSITIVITY: 0.3 mV

## 2015-07-11 ENCOUNTER — Encounter (HOSPITAL_COMMUNITY): Payer: Self-pay | Admitting: *Deleted

## 2015-07-11 ENCOUNTER — Emergency Department (HOSPITAL_COMMUNITY): Payer: Medicare Other

## 2015-07-11 ENCOUNTER — Emergency Department (HOSPITAL_COMMUNITY)
Admission: EM | Admit: 2015-07-11 | Discharge: 2015-07-12 | Disposition: A | Discharge status: Home / Self Care | Payer: Medicare Other | Attending: Emergency Medicine | Admitting: Emergency Medicine

## 2015-07-11 DIAGNOSIS — M1711 Unilateral primary osteoarthritis, right knee: Secondary | ICD-10-CM | POA: Diagnosis not present

## 2015-07-11 DIAGNOSIS — R0602 Shortness of breath: Secondary | ICD-10-CM | POA: Diagnosis not present

## 2015-07-11 DIAGNOSIS — E785 Hyperlipidemia, unspecified: Secondary | ICD-10-CM | POA: Insufficient documentation

## 2015-07-11 DIAGNOSIS — R011 Cardiac murmur, unspecified: Secondary | ICD-10-CM | POA: Diagnosis not present

## 2015-07-11 DIAGNOSIS — Z880 Allergy status to penicillin: Secondary | ICD-10-CM | POA: Diagnosis not present

## 2015-07-11 DIAGNOSIS — M810 Age-related osteoporosis without current pathological fracture: Secondary | ICD-10-CM | POA: Insufficient documentation

## 2015-07-11 DIAGNOSIS — Z87891 Personal history of nicotine dependence: Secondary | ICD-10-CM | POA: Diagnosis not present

## 2015-07-11 DIAGNOSIS — Z86711 Personal history of pulmonary embolism: Secondary | ICD-10-CM | POA: Insufficient documentation

## 2015-07-11 DIAGNOSIS — I252 Old myocardial infarction: Secondary | ICD-10-CM | POA: Diagnosis not present

## 2015-07-11 DIAGNOSIS — Z9889 Other specified postprocedural states: Secondary | ICD-10-CM | POA: Insufficient documentation

## 2015-07-11 DIAGNOSIS — Z7951 Long term (current) use of inhaled steroids: Secondary | ICD-10-CM | POA: Diagnosis not present

## 2015-07-11 DIAGNOSIS — I4891 Unspecified atrial fibrillation: Secondary | ICD-10-CM | POA: Insufficient documentation

## 2015-07-11 DIAGNOSIS — I509 Heart failure, unspecified: Secondary | ICD-10-CM | POA: Insufficient documentation

## 2015-07-11 DIAGNOSIS — K219 Gastro-esophageal reflux disease without esophagitis: Secondary | ICD-10-CM | POA: Diagnosis not present

## 2015-07-11 DIAGNOSIS — J189 Pneumonia, unspecified organism: Secondary | ICD-10-CM | POA: Diagnosis not present

## 2015-07-11 DIAGNOSIS — J159 Unspecified bacterial pneumonia: Secondary | ICD-10-CM | POA: Diagnosis not present

## 2015-07-11 DIAGNOSIS — Z951 Presence of aortocoronary bypass graft: Secondary | ICD-10-CM | POA: Insufficient documentation

## 2015-07-11 DIAGNOSIS — Z8673 Personal history of transient ischemic attack (TIA), and cerebral infarction without residual deficits: Secondary | ICD-10-CM | POA: Insufficient documentation

## 2015-07-11 DIAGNOSIS — Z79899 Other long term (current) drug therapy: Secondary | ICD-10-CM | POA: Diagnosis not present

## 2015-07-11 DIAGNOSIS — R05 Cough: Secondary | ICD-10-CM | POA: Diagnosis not present

## 2015-07-11 DIAGNOSIS — I2510 Atherosclerotic heart disease of native coronary artery without angina pectoris: Secondary | ICD-10-CM | POA: Diagnosis not present

## 2015-07-11 DIAGNOSIS — R0600 Dyspnea, unspecified: Secondary | ICD-10-CM

## 2015-07-11 HISTORY — DX: Heart failure, unspecified: I50.9

## 2015-07-11 LAB — CBC WITH DIFFERENTIAL/PLATELET
BASOS ABS: 0 10*3/uL (ref 0.0–0.1)
Basophils Relative: 1 %
EOS PCT: 7 %
Eosinophils Absolute: 0.4 10*3/uL (ref 0.0–0.7)
HEMATOCRIT: 40 % (ref 39.0–52.0)
Hemoglobin: 13.5 g/dL (ref 13.0–17.0)
LYMPHS ABS: 0.9 10*3/uL (ref 0.7–4.0)
Lymphocytes Relative: 15 %
MCH: 31.2 pg (ref 26.0–34.0)
MCHC: 33.8 g/dL (ref 30.0–36.0)
MCV: 92.4 fL (ref 78.0–100.0)
MONO ABS: 0.7 10*3/uL (ref 0.1–1.0)
MONOS PCT: 12 %
NEUTROS ABS: 3.9 10*3/uL (ref 1.7–7.7)
Neutrophils Relative %: 65 %
PLATELETS: 172 10*3/uL (ref 150–400)
RBC: 4.33 MIL/uL (ref 4.22–5.81)
RDW: 13.4 % (ref 11.5–15.5)
WBC: 6 10*3/uL (ref 4.0–10.5)

## 2015-07-11 LAB — COMPREHENSIVE METABOLIC PANEL
ALT: 15 U/L — AB (ref 17–63)
AST: 21 U/L (ref 15–41)
Albumin: 3.6 g/dL (ref 3.5–5.0)
Alkaline Phosphatase: 58 U/L (ref 38–126)
Anion gap: 9 (ref 5–15)
BILIRUBIN TOTAL: 0.7 mg/dL (ref 0.3–1.2)
BUN: 11 mg/dL (ref 6–20)
CHLORIDE: 93 mmol/L — AB (ref 101–111)
CO2: 25 mmol/L (ref 22–32)
CREATININE: 0.91 mg/dL (ref 0.61–1.24)
Calcium: 8.9 mg/dL (ref 8.9–10.3)
Glucose, Bld: 102 mg/dL — ABNORMAL HIGH (ref 65–99)
POTASSIUM: 4.5 mmol/L (ref 3.5–5.1)
Sodium: 127 mmol/L — ABNORMAL LOW (ref 135–145)
TOTAL PROTEIN: 6.2 g/dL — AB (ref 6.5–8.1)

## 2015-07-11 LAB — I-STAT TROPONIN, ED: TROPONIN I, POC: 0.01 ng/mL (ref 0.00–0.08)

## 2015-07-11 LAB — PROTIME-INR
INR: 2.62 — ABNORMAL HIGH (ref 0.00–1.49)
Prothrombin Time: 27.7 seconds — ABNORMAL HIGH (ref 11.6–15.2)

## 2015-07-11 MED ORDER — IPRATROPIUM-ALBUTEROL 0.5-2.5 (3) MG/3ML IN SOLN
3.0000 mL | Freq: Once | RESPIRATORY_TRACT | Status: AC
  Administered 2015-07-11: 3 mL via RESPIRATORY_TRACT
  Filled 2015-07-11: qty 3

## 2015-07-11 MED ORDER — METHYLPREDNISOLONE SODIUM SUCC 125 MG IJ SOLR
125.0000 mg | Freq: Once | INTRAMUSCULAR | Status: AC
  Administered 2015-07-12: 125 mg via INTRAVENOUS
  Filled 2015-07-11: qty 2

## 2015-07-11 MED ORDER — IOPAMIDOL (ISOVUE-370) INJECTION 76%
INTRAVENOUS | Status: AC
  Administered 2015-07-11: 70 mL
  Filled 2015-07-11: qty 100

## 2015-07-11 NOTE — ED Notes (Signed)
Patient presents stating he had a coughing spell and got scared.  Has a low EF, valve replacement and defib device

## 2015-07-11 NOTE — ED Notes (Signed)
MD at bedside. 

## 2015-07-11 NOTE — ED Provider Notes (Signed)
CSN: [id number]     Arrival date & time 07/11/15  2040 History   First MD Initiated Contact with Patient 07/11/15 2115     Chief Complaint  Patient presents with  . Cough  . Shortness of Breath     (Consider location/radiation/quality/duration/timing/severity/associated sxs/prior Treatment) HPI Comments: 82 year old male with history of atrial fibrillation, ventricular tachycardia, ischemic cardiomyopathy, Coumadin use, multiple pulmonary emboli, aortic valve replacement presents with recurrent productive cough and shortness of breath worsening the past 2 days. Low-grade temperature no fever recorded. Patient feels difficulty getting a breath. No chest pain. Patient's had pneumonia multiple times. No recent hospitalization. No leg swelling.  Patient is a 82 y.o. male presenting with cough and shortness of breath. The history is provided by the patient.  Cough Associated symptoms: shortness of breath and wheezing   Associated symptoms: no chest pain, no chills, no fever, no headaches and no rash   Shortness of Breath Associated symptoms: cough and wheezing   Associated symptoms: no abdominal pain, no chest pain, no fever, no headaches, no neck pain, no rash and no vomiting     Past Medical History  Diagnosis Date  . Ischemic cardiomyopathy 05/2010    Has EF of 25%  . Diverticulitis     CURRENTLY CONTROLLED WITH NO EVIDENCE OF RECURRENT INFECTION  . AF (atrial fibrillation) ([place])     Has not tolerated amiodarone in the past. Amiodarone was stopped in September of 2010 due to side effects  . Chronic anticoagulation     on coumadin  . Prostate cancer ([place])   . Osteoarthritis     RIGHT KNEE  . Aortic stenosis, severe     WITH PERCUTANEOUS AORTIC VALVE (TAVI) IN March 2012 at the [facility]  . MI (myocardial infarction) ([place]) 1974, 1978  . Cervical myelopathy ([place])   . Pulmonary embolism ([place])   . Hyperlipidemia   . GERD (gastroesophageal reflux disease)   . Esophageal  stricture     WITH DILATATION  . NSVT (nonsustained ventricular tachycardia) ([place])   . Coronary artery disease   . Heart murmur   . Osteoporosis   . Stroke [name])     3 strokes  . CHF (congestive heart failure) [facility])    Past Surgical History  Procedure Laterality Date  . Icd  Feb 2003; 02/2013    gen change 02-11-2013 by Dr Caryl Comes  . Coronary artery bypass graft  1979  . Coronary artery bypass graft  1991    REDO SURGERY  . Cardiac catheterization  2010    SEVERE LV DYSFUNCTION WITH ESTIMATED EJECTION FRACTION OF 25%  . Cholecystectomy    . Aortic valve replacement      Percutaneous AVR in March 2012 at the [facility]  . Cardioversion  02/16/2011    Procedure: CARDIOVERSION;  Surgeon: Carlena Bjornstad, MD;  Location: [place];  Service: Cardiovascular;  Laterality: N/A;  . Shoulder surgery    . Back surgery    . Implantable cardioverter defibrillator (icd) generator change N/A 02/10/2013    Procedure: ICD GENERATOR CHANGE;  Surgeon: Deboraha Sprang, MD;  Location: [facility] C[facility] LAB;  Service: Cardiovascular;  Laterality: N/A;   Family History  Problem Relation Age of Onset  . Heart disease Mother 91  . Diabetes Mother 71  . Heart disease Father 76   Social History  Substance Use Topics  . Smoking status: Former Smoker -- 4.00 packs/day for 15 years    Types: Cigarettes    Quit date: 06/15/1969  .  Smokeless tobacco: Never Used  . Alcohol Use: No    Review of Systems  Constitutional: Negative for fever and chills.  HENT: Negative for congestion.   Eyes: Negative for visual disturbance.  Respiratory: Positive for cough, shortness of breath and wheezing.   Cardiovascular: Negative for chest pain.  Gastrointestinal: Negative for vomiting and abdominal pain.  Genitourinary: Negative for dysuria and flank pain.  Musculoskeletal: Negative for back pain, neck pain and neck stiffness.  Skin: Negative for rash.  Neurological: Negative for light-headedness and headaches.       Allergies  Penicillins; Tizanidine; Ace inhibitors; Antihistamines, diphenhydramine-type; and Amiodarone  Home Medications   Prior to Admission medications   Medication Sig Start Date End Date Taking? Authorizing Provider  acetaminophen (TYLENOL) 500 MG tablet Take 1,000 mg by mouth every 6 (six) hours as needed.   Yes Historical Provider, MD  alfuzosin (UROXATRAL) 10 MG 24 hr tablet Take 10 mg by mouth at bedtime.    Yes Historical Provider, MD  atorvastatin (LIPITOR) 10 MG tablet TAKE ONE TABLET EVERY OTHER DAY. 04/15/15  Yes Sherren Mocha, MD  Budesonide-Formoterol Fumarate (SYMBICORT IN) Inhale 1 puff into the lungs 2 (two) times daily.   Yes Historical Provider, MD  calcium carbonate (OS-CAL) 600 MG TABS tablet Take 600 mg by mouth 2 (two) times daily with a meal. 600 +D   Yes Historical Provider, MD  carvedilol (COREG) 6.25 MG tablet TAKE 1 TABLET TWICE DAILY WITH A MEAL. 03/31/15  Yes Sherren Mocha, MD  cholecalciferol (VITAMIN D) 1000 UNITS tablet Take 1,000 Units by mouth 2 (two) times daily.   Yes Historical Provider, MD  COUMADIN 5 MG tablet TAKE AS DIRECTED BY COUMADIN CLINIC. Patient taking differently: Take 5 mg daily 06/24/15  Yes Sherren Mocha, MD  furosemide (LASIX) 20 MG tablet Take 1 tablet (20 mg total) by mouth daily as needed (for swelling). 03/19/15  Yes Sherren Mocha, MD  HYDROcodone-acetaminophen (NORCO/VICODIN) 5-325 MG per tablet Take 1 tablet by mouth every 6 (six) hours as needed for moderate pain. 01/20/14  Yes Jessica U Vann, DO  ipratropium (ATROVENT) 0.03 % nasal spray Place 2 sprays into both nostrils 2 (two) times daily.   Yes Historical Provider, MD  levothyroxine (SYNTHROID, LEVOTHROID) 150 MCG tablet Take 150 mcg by mouth daily before breakfast.   Yes Historical Provider, MD  omeprazole (PRILOSEC) 20 MG capsule Take 20 mg by mouth daily as needed (acid reflux).   Yes Historical Provider, MD  polyethylene glycol (MIRALAX / GLYCOLAX) packet Take 17 g  by mouth daily as needed for mild constipation.    Yes Historical Provider, MD  Probiotic Product (PROBIOTIC DAILY PO) Take 1 capsule by mouth daily.   Yes Historical Provider, MD  RESTASIS 0.05 % ophthalmic emulsion Place 1 drop into both eyes every 12 (twelve) hours.  12/05/11  Yes Historical Provider, MD  NITROSTAT 0.4 MG SL tablet DISSOLVE 1 TABLET UNDER TONGUE AS NEEDED FOR CHEST PAIN,MAY REPEAT IN5 MINUTES FOR 2 DOSES. 08/26/14   Sherren Mocha, MD  predniSONE (DELTASONE) 10 MG tablet Take 2 tablets (20 mg total) by mouth daily. Patient not taking: Reported on 07/11/2015 03/28/15   Milton Ferguson, MD   BP 138/78 mmHg  Pulse 60  Temp(Src) 98.5 F (36.9 C) (Oral)  Resp 24  Ht 6\' 3"  (1.905 m)  Wt 194 lb 4.8 oz (88.134 kg)  BMI 24.29 kg/m2  SpO2 100% Physical Exam  Constitutional: He is oriented to person, place, and time. He appears well-developed and  well-nourished.  HENT:  Head: Normocephalic and atraumatic.  Eyes: Right eye exhibits no discharge. Left eye exhibits no discharge.  Neck: Normal range of motion. Neck supple. No tracheal deviation present.  Cardiovascular: Normal rate and regular rhythm.   Pulmonary/Chest: Effort normal. He has wheezes (expiratory bilateral middle lobes). He has rales (sparse rales).  Abdominal: Soft. He exhibits no distension. There is no tenderness. There is no guarding.  Musculoskeletal: He exhibits no edema.  Neurological: He is alert and oriented to person, place, and time.  Skin: Skin is warm. No rash noted.  Psychiatric: He has a normal mood and affect.  Nursing note and vitals reviewed.   ED Course  Procedures (including critical care time) Labs Review Labs Reviewed  COMPREHENSIVE METABOLIC PANEL - Abnormal; Notable for the following:    Sodium 127 (*)    Chloride 93 (*)    Glucose, Bld 102 (*)    Total Protein 6.2 (*)    ALT 15 (*)    All other components within normal limits  PROTIME-INR - Abnormal; Notable for the following:     Prothrombin Time 27.7 (*)    INR 2.62 (*)    All other components within normal limits  CBC WITH DIFFERENTIAL/PLATELET  Randolm Idol, ED    Imaging Review Dg Chest 2 View  07/11/2015  CLINICAL DATA:  Yellow productive cough for 1 week. Shortness of breath and wheezing for 1 week. EXAM: CHEST  2 VIEW COMPARISON:  03/28/2015 FINDINGS: Cardiac pacemaker. Postoperative changes in the mediastinum. Normal heart size and pulmonary vascularity. Reticular and nodular interstitial pattern to the lung bases. This is not significantly changed since previous study and probably represents chronic process. No focal airspace disease or consolidation in the lungs. No blunting of costophrenic angles. No pneumothorax. Degenerative changes in the spine and shoulders. IMPRESSION: Chronic appearing interstitial pattern to the lungs. No evidence of active pulmonary disease. Electronically Signed   By: Lucienne Capers M.D.   On: 07/11/2015 22:07   I have personally reviewed and evaluated these images and lab results as part of my medical decision-making.   EKG Interpretation   Date/Time:  Sunday July 11 2015 20:52:08 EDT Ventricular Rate:  64 PR Interval:    QRS Duration: 108 QT Interval:  390 QTC Calculation: 402 R Axis:   97 Text Interpretation:  Atrial fibrillation Rightward axis Cannot rule out  Anterior infarct , age undetermined ST \\T \ T wave abnormality, consider  lateral ischemia Abnormal ECG Confirmed by Jenavi Beedle  MD, Klohe Lovering ([id number]) on  07/11/2015 9:03:32 PM      MDM   Final diagnoses:  Acute dyspnea  Cough  Patient presents with protective cough and worsening shortness of breath. Patient has significant cardiac history.. Plan for cardiac screen, chest x-ray look for signs of pneumonia or fluid. Discussed possibility of blood clot as well however most likely infectious with other symptoms. Plan to check INR.  Patient improved on reassessment after nebulizer. Chest x-ray no significant  findings. With dyspnea worse than normal, pulmonary embolism history and cough plan for CT scan to look for both blood clot or a cold pneumonia. If CT scan unremarkable patient comfortable following up with primary doctor. Patient's care will be signed out to follow-up results of CT.   Medications  methylPREDNISolone sodium succinate (SOLU-MEDROL) 125 mg/2 mL injection 125 mg (not administered)  ipratropium-albuterol (DUONEB) 0.5-2.5 (3) MG/3ML nebulizer solution 3 mL (3 mLs Nebulization Given 07/11/15 2245)  iopamidol (ISOVUE-370) 76 % injection (75 mLs  Contrast Given  07/11/15 2322)    Filed Vitals:   07/11/15 2052 07/11/15 2130  BP: 149/72 138/78  Pulse: 60   Temp: 98.5 F (36.9 C)   TempSrc: Oral   Resp: 20 24  Height: 6\' 3"  (1.905 m)   Weight: 194 lb 4.8 oz (88.134 kg)   SpO2: 100%     Final diagnoses:  Acute dyspnea      Elnora Morrison, MD 07/11/15 2327

## 2015-07-11 NOTE — ED Notes (Signed)
Patient transported to X-ray 

## 2015-07-11 NOTE — ED Notes (Signed)
Patient transported to CT 

## 2015-07-12 DIAGNOSIS — J159 Unspecified bacterial pneumonia: Secondary | ICD-10-CM | POA: Diagnosis not present

## 2015-07-12 MED ORDER — AZITHROMYCIN 250 MG PO TABS
ORAL_TABLET | ORAL | Status: DC
Start: 2015-07-12 — End: 2015-07-19

## 2015-07-12 MED ORDER — AZITHROMYCIN 250 MG PO TABS
500.0000 mg | ORAL_TABLET | Freq: Once | ORAL | Status: AC
  Administered 2015-07-12: 500 mg via ORAL
  Filled 2015-07-12: qty 2

## 2015-07-12 NOTE — Discharge Instructions (Signed)

## 2015-07-12 NOTE — ED Provider Notes (Signed)
I assumed care in signout to f/u on CT chest CT neg for PE, ?early pneumonia Pt with cough/congestion.   Due to multiple medical problems, will start antibiotics Pt reports he has had good success with zpack before.  He has his coumadin checked frequently so I feel it is safe to start and he can have his INR monitored We discussed strict return precautions I reviewed labs/ekg When I am in room, pt without any vital signs abnormalities.  He was not bradycardic on my evaluation (one vital sign showed bradycardia)  Ripley Fraise, MD 07/12/15 [phone]

## 2015-07-15 ENCOUNTER — Encounter: Payer: Self-pay | Admitting: Cardiovascular Disease

## 2015-07-15 DIAGNOSIS — Z6824 Body mass index (BMI) 24.0-24.9, adult: Secondary | ICD-10-CM | POA: Diagnosis not present

## 2015-07-15 DIAGNOSIS — J189 Pneumonia, unspecified organism: Secondary | ICD-10-CM | POA: Diagnosis not present

## 2015-07-15 DIAGNOSIS — R05 Cough: Secondary | ICD-10-CM | POA: Diagnosis not present

## 2015-07-15 DIAGNOSIS — E871 Hypo-osmolality and hyponatremia: Secondary | ICD-10-CM | POA: Diagnosis not present

## 2015-07-15 DIAGNOSIS — I429 Cardiomyopathy, unspecified: Secondary | ICD-10-CM | POA: Diagnosis not present

## 2015-07-15 DIAGNOSIS — I482 Chronic atrial fibrillation: Secondary | ICD-10-CM | POA: Diagnosis not present

## 2015-07-19 ENCOUNTER — Telehealth: Payer: Self-pay | Admitting: Cardiovascular Disease

## 2015-07-19 ENCOUNTER — Ambulatory Visit (INDEPENDENT_AMBULATORY_CARE_PROVIDER_SITE_OTHER): Payer: Medicare Other | Admitting: Interventional Cardiology

## 2015-07-19 ENCOUNTER — Encounter: Payer: Self-pay | Admitting: Cardiology

## 2015-07-19 ENCOUNTER — Ambulatory Visit (INDEPENDENT_AMBULATORY_CARE_PROVIDER_SITE_OTHER): Payer: Medicare Other | Admitting: Cardiology

## 2015-07-19 VITALS — BP 122/80 | HR 73 | Ht 75.0 in | Wt 186.8 lb

## 2015-07-19 DIAGNOSIS — I4891 Unspecified atrial fibrillation: Secondary | ICD-10-CM | POA: Diagnosis not present

## 2015-07-19 DIAGNOSIS — R0600 Dyspnea, unspecified: Secondary | ICD-10-CM | POA: Diagnosis not present

## 2015-07-19 DIAGNOSIS — I482 Chronic atrial fibrillation, unspecified: Secondary | ICD-10-CM

## 2015-07-19 DIAGNOSIS — Z5181 Encounter for therapeutic drug level monitoring: Secondary | ICD-10-CM

## 2015-07-19 DIAGNOSIS — I359 Nonrheumatic aortic valve disorder, unspecified: Secondary | ICD-10-CM | POA: Diagnosis not present

## 2015-07-19 DIAGNOSIS — I351 Nonrheumatic aortic (valve) insufficiency: Secondary | ICD-10-CM | POA: Diagnosis not present

## 2015-07-19 LAB — BASIC METABOLIC PANEL
BUN: 12 mg/dL (ref 7–25)
CHLORIDE: 94 mmol/L — AB (ref 98–110)
CO2: 28 mmol/L (ref 20–31)
Calcium: 8.9 mg/dL (ref 8.6–10.3)
Creat: 0.96 mg/dL (ref 0.70–1.11)
Glucose, Bld: 102 mg/dL — ABNORMAL HIGH (ref 65–99)
POTASSIUM: 4.6 mmol/L (ref 3.5–5.3)
Sodium: 129 mmol/L — ABNORMAL LOW (ref 135–146)

## 2015-07-19 LAB — POCT INR: INR: 3.8

## 2015-07-19 LAB — BRAIN NATRIURETIC PEPTIDE: Brain Natriuretic Peptide: 204.4 pg/mL — ABNORMAL HIGH (ref <100)

## 2015-07-19 NOTE — Patient Instructions (Addendum)
Medication Instructions:  None  Labwork: BMET, BNP today  Testing/Procedures: None   Follow-Up: Your physician recommends that you schedule a follow-up appointment next available with Dr. Burt Knack.   Any Other Special Instructions Will Be Listed Below (If Applicable).     If you need a refill on your cardiac medications before your next appointment, please call your pharmacy.

## 2015-07-19 NOTE — Telephone Encounter (Signed)
Pt c/o Shortness Of Breath: STAT if SOB developed within the last 24 hours or pt is noticeably SOB on the phone  1. Are you currently SOB (can you hear that pt is SOB on the phone)? Yes    2. How long have you been experiencing SOB? Today 5/8  3. Are you SOB when sitting or when up moving around? Walking   4. Are you currently experiencing any other symptoms? no

## 2015-07-19 NOTE — Telephone Encounter (Signed)
Pt scheduled to see Ellen Henri today at 10 AM. Pt aware.

## 2015-07-19 NOTE — Progress Notes (Signed)
07/19/2015 [name]   1932-08-24  [id number]  Primary Physician [name], MD Primary Cardiologist: Dr. Burt Knack  Electrophysiologist: Dr. Caryl Comes  Reason for Visit/CC: Dyspnea  HPI:  The patient is a 82 year old male, who presents to clinic as an add on with a chief complaint of dyspnea.  He is followed both by Dr. Burt Knack as well as Dr. Caryl Comes. He has a history of congestive heart failure/ischemic cardiomyopathy, CAD and aortic valve disease. His CAD was initially treated with CABG surgery in 1979. He then had redo CABG in 1991. He's had severe ischemic cardiomyopathy with left ventricular EF as low as 25%. He has an ICD which is followed by Dr. Caryl Comes. He also underwent transcatheter aortic valve replacement the [facility] in 2012 for treatment of severe symptomatic aortic stenosis. He has had moderate perivalvular aortic insufficiency. He also has a history of permanent atrial fibrillation and is maintained on long-term anticoagulation therapy with Coumadin. Also with prior h/o PE.  He notes he was seen in the [facility] ED on 07/11/15 with complaint of dyspnea and cough. Chest CT was negative for PE. PNA could not be excluded. There was felt to be possible edema and mucus plugging, felt to represent possible bronchitis. He was given a Zpack and discharged home from the ED. He finished his course of antibiotics on 07/15/15.  He notes that he continues to feel short of breath at rest. Today while walking around his house, he had extreme exertional dyspnea but no chest pain. He was also short of breath while talking on the phone with his son earlier today and was unable to speak in complete sentences. He continues to have a productive cough with green /yellow colored sputum. No fevers or chills.  In clinic today, he appears comfortable at rest. EKG shows chronic atrial fibrillation. HR is well controlled at 73 bpm.    Current Outpatient Prescriptions  Medication Sig Dispense Refill  .  acetaminophen (TYLENOL) 500 MG tablet Take 1,000 mg by mouth every 6 (six) hours as needed.    Marland Kitchen alfuzosin (UROXATRAL) 10 MG 24 hr tablet Take 10 mg by mouth at bedtime.     Marland Kitchen atorvastatin (LIPITOR) 10 MG tablet TAKE ONE TABLET EVERY OTHER DAY. 45 tablet 2  . Budesonide-Formoterol Fumarate (SYMBICORT IN) Inhale 1 puff into the lungs 2 (two) times daily.    . calcium carbonate (OS-CAL) 600 MG TABS tablet Take 600 mg by mouth 2 (two) times daily with a meal. 600 +D    . carvedilol (COREG) 6.25 MG tablet TAKE 1 TABLET TWICE DAILY WITH A MEAL. 60 tablet 3  . cholecalciferol (VITAMIN D) 1000 UNITS tablet Take 1,000 Units by mouth 2 (two) times daily.    Marland Kitchen COUMADIN 5 MG tablet TAKE AS DIRECTED BY COUMADIN CLINIC. (Patient taking differently: Take 5 mg daily) 90 tablet 1  . furosemide (LASIX) 20 MG tablet Take 1 tablet (20 mg total) by mouth daily as needed (for swelling). 30 tablet 3  . HYDROcodone-acetaminophen (NORCO/VICODIN) 5-325 MG per tablet Take 1 tablet by mouth every 6 (six) hours as needed for moderate pain. 30 tablet 0  . ipratropium (ATROVENT) 0.03 % nasal spray Place 2 sprays into both nostrils 2 (two) times daily.    Marland Kitchen levothyroxine (SYNTHROID, LEVOTHROID) 150 MCG tablet Take 150 mcg by mouth daily before breakfast.    . NITROSTAT 0.4 MG SL tablet DISSOLVE 1 TABLET UNDER TONGUE AS NEEDED FOR CHEST PAIN,MAY REPEAT IN5 MINUTES FOR 2 DOSES. 25 tablet  0  . omeprazole (PRILOSEC) 20 MG capsule Take 20 mg by mouth daily as needed (acid reflux).    . polyethylene glycol (MIRALAX / GLYCOLAX) packet Take 17 g by mouth daily as needed for mild constipation.     . Probiotic Product (PROBIOTIC DAILY PO) Take 1 capsule by mouth daily.    . RESTASIS 0.05 % ophthalmic emulsion Place 1 drop into both eyes every 12 (twelve) hours.      No current facility-administered medications for this visit.    Allergies  Allergen Reactions  . Penicillins Shortness Of Breath and Rash    Has patient had a PCN  reaction causing immediate rash, facial/tongue/throat swelling, SOB or lightheadedness with hypotension: Yes Has patient had a PCN reaction causing severe rash involving mucus membranes or skin necrosis: No Has patient had a PCN reaction that required hospitalization No Has patient had a PCN reaction occurring within the last 10 years: No If all of the above answers are "NO", then may proceed with Cephalosporin use.   . Tizanidine Shortness Of Breath    Light headed  . Ace Inhibitors     Has not tolerated in the past due to hyperkalemia  . Antihistamines, Diphenhydramine-Type Other (See Comments)    Inhibits urination  . Amiodarone Other (See Comments)    Unknown     Social History   Social History  . Marital Status: Married    Spouse Name: N/A  . Number of Children: Y  . Years of Education: N/A   Occupational History  . retired. developer.     Social History Main Topics  . Smoking status: Former Smoker -- 4.00 packs/day for 15 years    Types: Cigarettes    Quit date: 06/15/1969  . Smokeless tobacco: Never Used  . Alcohol Use: No  . Drug Use: No  . Sexual Activity: Yes   Other Topics Concern  . Not on file   Social History Narrative     Review of Systems: General: negative for chills, fever, night sweats or weight changes.  Cardiovascular: negative for chest pain, dyspnea on exertion, edema, orthopnea, palpitations, paroxysmal nocturnal dyspnea or shortness of breath Dermatological: negative for rash Respiratory: negative for cough or wheezing Urologic: negative for hematuria Abdominal: negative for nausea, vomiting, diarrhea, bright red blood per rectum, melena, or hematemesis Neurologic: negative for visual changes, syncope, or dizziness All other systems reviewed and are otherwise negative except as noted above.    Height 6\' 3"  (1.905 m), weight 186 lb 12.8 oz (84.732 kg).  General appearance: alert, cooperative and no distress Neck: no carotid bruit and no  JVD Lungs: bibasilar wheezing and rhonchi Heart: irregularly irregular rhythm, regular rate Extremities: no LEE Pulses: 2+ and symmetric Skin: warm and dry Neurologic: Grossly normal  EKG atrial fibrillation. Rate is controlled at 73 bpm.  ASSESSMENT AND PLAN:   1. Bronchitis: Patient's recent symptoms including productive cough with shortness of breath along with physical exam findings suggest that he likely has unresolved acute bronchitis. The patient was also seen and examined by Dr. Burt Knack who also feels that this is the likely cause for his recent dyspnea. He does not appear to be volume overloaded, however we will check a BNP to assess volume status. Dr. Burt Knack has recommended that the patient touch base with his primary care provider, Dr. Virgina Jock, for a prescription for a new antibiotic to treat his unresolved bronchitis. Dr. Burt Knack does not recommend any other further cardiac workup at this time. Continue routine follow-up  with Dr. Burt Knack yearly.   Lyda Jester PA-C 07/19/2015 10:22 AM

## 2015-07-19 NOTE — Telephone Encounter (Signed)
Received call transferred from operator and spoke with pt. He reports shortness of breath when walking through his house today. Was unable to speak with son on the phone due to shortness of breath. I recommended pt call EMS but pt does not want to do this and wants to discuss with Lauren/Dr. Burt Knack.  Wants to know if he should seen Dr. Virgina Jock or Dr.Cooper.   Pt reports he was seen in ED recently for shortness of breath. Given breathing treatment and diagnosed with pneumonia.  Has finished antibiotic. He reports he is still slightly short of breath but that it has improved some from episode earlier.  He is able to speak with me on the phone. He reports his wife is with him.

## 2015-07-22 DIAGNOSIS — Z7901 Long term (current) use of anticoagulants: Secondary | ICD-10-CM | POA: Diagnosis not present

## 2015-07-22 DIAGNOSIS — I482 Chronic atrial fibrillation: Secondary | ICD-10-CM | POA: Diagnosis not present

## 2015-07-22 DIAGNOSIS — J189 Pneumonia, unspecified organism: Secondary | ICD-10-CM | POA: Diagnosis not present

## 2015-07-22 DIAGNOSIS — I272 Other secondary pulmonary hypertension: Secondary | ICD-10-CM | POA: Diagnosis not present

## 2015-07-22 DIAGNOSIS — E871 Hypo-osmolality and hyponatremia: Secondary | ICD-10-CM | POA: Diagnosis not present

## 2015-07-22 DIAGNOSIS — Z6824 Body mass index (BMI) 24.0-24.9, adult: Secondary | ICD-10-CM | POA: Diagnosis not present

## 2015-07-24 ENCOUNTER — Other Ambulatory Visit: Payer: Self-pay | Admitting: Cardiovascular Disease

## 2015-07-29 ENCOUNTER — Other Ambulatory Visit: Payer: Self-pay | Admitting: Cardiovascular Disease

## 2015-07-29 ENCOUNTER — Ambulatory Visit (INDEPENDENT_AMBULATORY_CARE_PROVIDER_SITE_OTHER): Payer: Medicare Other | Admitting: *Deleted

## 2015-07-29 DIAGNOSIS — I482 Chronic atrial fibrillation, unspecified: Secondary | ICD-10-CM

## 2015-07-29 DIAGNOSIS — Z5181 Encounter for therapeutic drug level monitoring: Secondary | ICD-10-CM | POA: Diagnosis not present

## 2015-07-29 LAB — POCT INR: INR: 2.5

## 2015-08-02 ENCOUNTER — Ambulatory Visit (INDEPENDENT_AMBULATORY_CARE_PROVIDER_SITE_OTHER): Payer: Medicare Other

## 2015-08-02 DIAGNOSIS — I5022 Chronic systolic (congestive) heart failure: Secondary | ICD-10-CM | POA: Diagnosis not present

## 2015-08-02 DIAGNOSIS — Z95810 Presence of automatic (implantable) cardiac defibrillator: Secondary | ICD-10-CM

## 2015-08-04 NOTE — Progress Notes (Signed)
EPIC Encounter for ICM Monitoring  Patient Name: [name] is a 82 y.o. male Date: 08/04/2015 Primary Care Physican: [name], MD Primary Cardiologist: Burt Knack Electrophysiologist: Caryl Comes Dry Weight: 180 lbs       In the past month, have you:  1. Gained more than 2 pounds in a day or more than 5 pounds in a week? Yes, 2 lbs in the last week and was resolved by taking 1 dose of prn lasix  2. Had changes in your medications (with verification of current medications)? no  3. Had more shortness of breath than is usual for you? Yes, due to fluid, pneumonia and bronchitis  4. Limited your activity because of shortness of breath? no  5. Not been able to sleep because of shortness of breath? no  6. Had increased swelling in your feet, ankles, legs or stomach area? no  7. Had symptoms of dehydration (dizziness, dry mouth, increased thirst, decreased urine output) no  8. Had changes in sodium restriction? no  9. Been compliant with medication? Yes  ICM trend: 3 month view for 08/02/2015   ICM trend: 1 year view for 08/02/2015   Follow-up plan: ICM clinic phone appointment 09/07/2015.    FLUID LEVELS:  Optivol thoracic impedance decreased 07/19/2015 to 08/01/2015 suggesting fluid accumulation and returned to baseline 08/02/2015.    SYMPTOMS:    He had SOB and weight gain of 2 pounds within a week but has resolved since taking Lasix on 08/07/2015.  He had pneumonia and bronchitis in the last 2 weeks but after taking 4 antibiotics he is feeling much better.  His breathing has returned to his baseline.  Encouraged to call for any fluid symptoms.   RECOMMENDATIONS: No changes today.    Rosalene Billings, RN, CCM 08/04/2015 9:24 AM

## 2015-08-17 ENCOUNTER — Ambulatory Visit (INDEPENDENT_AMBULATORY_CARE_PROVIDER_SITE_OTHER): Payer: Medicare Other | Admitting: Internal Medicine

## 2015-08-17 DIAGNOSIS — I482 Chronic atrial fibrillation, unspecified: Secondary | ICD-10-CM

## 2015-08-17 DIAGNOSIS — Z5181 Encounter for therapeutic drug level monitoring: Secondary | ICD-10-CM

## 2015-08-17 DIAGNOSIS — I4891 Unspecified atrial fibrillation: Secondary | ICD-10-CM | POA: Diagnosis not present

## 2015-08-17 DIAGNOSIS — Z7901 Long term (current) use of anticoagulants: Secondary | ICD-10-CM | POA: Diagnosis not present

## 2015-08-17 LAB — POCT INR: INR: 2.8

## 2015-08-19 DIAGNOSIS — E871 Hypo-osmolality and hyponatremia: Secondary | ICD-10-CM | POA: Diagnosis not present

## 2015-08-19 DIAGNOSIS — I429 Cardiomyopathy, unspecified: Secondary | ICD-10-CM | POA: Diagnosis not present

## 2015-08-19 DIAGNOSIS — L89159 Pressure ulcer of sacral region, unspecified stage: Secondary | ICD-10-CM | POA: Diagnosis not present

## 2015-08-19 DIAGNOSIS — R627 Adult failure to thrive: Secondary | ICD-10-CM | POA: Diagnosis not present

## 2015-08-19 DIAGNOSIS — R739 Hyperglycemia, unspecified: Secondary | ICD-10-CM | POA: Diagnosis not present

## 2015-08-19 DIAGNOSIS — Z6824 Body mass index (BMI) 24.0-24.9, adult: Secondary | ICD-10-CM | POA: Diagnosis not present

## 2015-08-19 DIAGNOSIS — I482 Chronic atrial fibrillation: Secondary | ICD-10-CM | POA: Diagnosis not present

## 2015-08-19 DIAGNOSIS — I5022 Chronic systolic (congestive) heart failure: Secondary | ICD-10-CM | POA: Diagnosis not present

## 2015-08-19 DIAGNOSIS — I272 Other secondary pulmonary hypertension: Secondary | ICD-10-CM | POA: Diagnosis not present

## 2015-08-19 DIAGNOSIS — Z7901 Long term (current) use of anticoagulants: Secondary | ICD-10-CM | POA: Diagnosis not present

## 2015-08-19 DIAGNOSIS — D692 Other nonthrombocytopenic purpura: Secondary | ICD-10-CM | POA: Diagnosis not present

## 2015-08-25 DIAGNOSIS — M810 Age-related osteoporosis without current pathological fracture: Secondary | ICD-10-CM | POA: Diagnosis not present

## 2015-08-25 DIAGNOSIS — E559 Vitamin D deficiency, unspecified: Secondary | ICD-10-CM | POA: Diagnosis not present

## 2015-08-25 DIAGNOSIS — M25561 Pain in right knee: Secondary | ICD-10-CM | POA: Diagnosis not present

## 2015-08-25 DIAGNOSIS — R5383 Other fatigue: Secondary | ICD-10-CM | POA: Diagnosis not present

## 2015-08-31 LAB — POCT INR: INR: 4.1

## 2015-09-01 ENCOUNTER — Ambulatory Visit (INDEPENDENT_AMBULATORY_CARE_PROVIDER_SITE_OTHER): Payer: Medicare Other | Admitting: Internal Medicine

## 2015-09-01 ENCOUNTER — Emergency Department (HOSPITAL_COMMUNITY): Payer: Medicare Other

## 2015-09-01 ENCOUNTER — Observation Stay (HOSPITAL_COMMUNITY)
Admission: EM | Admit: 2015-09-01 | Discharge: 2015-09-02 | Disposition: A | Discharge status: Home / Self Care | Payer: Medicare Other | Attending: Internal Medicine | Admitting: Internal Medicine

## 2015-09-01 ENCOUNTER — Other Ambulatory Visit: Payer: Self-pay

## 2015-09-01 ENCOUNTER — Telehealth: Payer: Self-pay

## 2015-09-01 ENCOUNTER — Encounter (HOSPITAL_COMMUNITY): Payer: Self-pay | Admitting: Vascular Surgery

## 2015-09-01 DIAGNOSIS — I5022 Chronic systolic (congestive) heart failure: Secondary | ICD-10-CM | POA: Diagnosis not present

## 2015-09-01 DIAGNOSIS — E039 Hypothyroidism, unspecified: Secondary | ICD-10-CM | POA: Diagnosis not present

## 2015-09-01 DIAGNOSIS — I482 Chronic atrial fibrillation, unspecified: Secondary | ICD-10-CM | POA: Diagnosis present

## 2015-09-01 DIAGNOSIS — Z952 Presence of prosthetic heart valve: Secondary | ICD-10-CM | POA: Diagnosis not present

## 2015-09-01 DIAGNOSIS — Z8546 Personal history of malignant neoplasm of prostate: Secondary | ICD-10-CM | POA: Insufficient documentation

## 2015-09-01 DIAGNOSIS — Z86711 Personal history of pulmonary embolism: Secondary | ICD-10-CM | POA: Insufficient documentation

## 2015-09-01 DIAGNOSIS — R531 Weakness: Secondary | ICD-10-CM

## 2015-09-01 DIAGNOSIS — Z5181 Encounter for therapeutic drug level monitoring: Secondary | ICD-10-CM

## 2015-09-01 DIAGNOSIS — Z87891 Personal history of nicotine dependence: Secondary | ICD-10-CM | POA: Diagnosis not present

## 2015-09-01 DIAGNOSIS — R0609 Other forms of dyspnea: Secondary | ICD-10-CM | POA: Diagnosis not present

## 2015-09-01 DIAGNOSIS — Z7901 Long term (current) use of anticoagulants: Secondary | ICD-10-CM | POA: Insufficient documentation

## 2015-09-01 DIAGNOSIS — Z880 Allergy status to penicillin: Secondary | ICD-10-CM | POA: Diagnosis not present

## 2015-09-01 DIAGNOSIS — R0602 Shortness of breath: Secondary | ICD-10-CM | POA: Diagnosis not present

## 2015-09-01 DIAGNOSIS — I252 Old myocardial infarction: Secondary | ICD-10-CM | POA: Diagnosis not present

## 2015-09-01 DIAGNOSIS — M1711 Unilateral primary osteoarthritis, right knee: Secondary | ICD-10-CM | POA: Insufficient documentation

## 2015-09-01 DIAGNOSIS — Z951 Presence of aortocoronary bypass graft: Secondary | ICD-10-CM | POA: Diagnosis not present

## 2015-09-01 DIAGNOSIS — E871 Hypo-osmolality and hyponatremia: Secondary | ICD-10-CM | POA: Diagnosis present

## 2015-09-01 DIAGNOSIS — R0600 Dyspnea, unspecified: Secondary | ICD-10-CM | POA: Diagnosis not present

## 2015-09-01 DIAGNOSIS — I350 Nonrheumatic aortic (valve) stenosis: Secondary | ICD-10-CM | POA: Diagnosis not present

## 2015-09-01 DIAGNOSIS — M810 Age-related osteoporosis without current pathological fracture: Secondary | ICD-10-CM | POA: Insufficient documentation

## 2015-09-01 DIAGNOSIS — R404 Transient alteration of awareness: Secondary | ICD-10-CM | POA: Diagnosis not present

## 2015-09-01 DIAGNOSIS — I255 Ischemic cardiomyopathy: Secondary | ICD-10-CM | POA: Diagnosis not present

## 2015-09-01 DIAGNOSIS — E785 Hyperlipidemia, unspecified: Secondary | ICD-10-CM | POA: Insufficient documentation

## 2015-09-01 DIAGNOSIS — Z7982 Long term (current) use of aspirin: Secondary | ICD-10-CM | POA: Diagnosis not present

## 2015-09-01 DIAGNOSIS — I2510 Atherosclerotic heart disease of native coronary artery without angina pectoris: Secondary | ICD-10-CM | POA: Insufficient documentation

## 2015-09-01 DIAGNOSIS — E038 Other specified hypothyroidism: Secondary | ICD-10-CM

## 2015-09-01 DIAGNOSIS — Z8673 Personal history of transient ischemic attack (TIA), and cerebral infarction without residual deficits: Secondary | ICD-10-CM | POA: Insufficient documentation

## 2015-09-01 DIAGNOSIS — R5383 Other fatigue: Secondary | ICD-10-CM | POA: Diagnosis not present

## 2015-09-01 DIAGNOSIS — E0590 Thyrotoxicosis, unspecified without thyrotoxic crisis or storm: Secondary | ICD-10-CM | POA: Diagnosis not present

## 2015-09-01 DIAGNOSIS — K219 Gastro-esophageal reflux disease without esophagitis: Secondary | ICD-10-CM | POA: Diagnosis not present

## 2015-09-01 LAB — BASIC METABOLIC PANEL
Anion gap: 8 (ref 5–15)
BUN: 13 mg/dL (ref 6–20)
CALCIUM: 8.6 mg/dL — AB (ref 8.9–10.3)
CHLORIDE: 94 mmol/L — AB (ref 101–111)
CO2: 27 mmol/L (ref 22–32)
CREATININE: 1.1 mg/dL (ref 0.61–1.24)
Glucose, Bld: 89 mg/dL (ref 65–99)
Potassium: 4 mmol/L (ref 3.5–5.1)
SODIUM: 129 mmol/L — AB (ref 135–145)

## 2015-09-01 LAB — CBC
HCT: 42.4 % (ref 39.0–52.0)
Hemoglobin: 13.7 g/dL (ref 13.0–17.0)
MCH: 29.5 pg (ref 26.0–34.0)
MCHC: 32.3 g/dL (ref 30.0–36.0)
MCV: 91.2 fL (ref 78.0–100.0)
PLATELETS: 167 10*3/uL (ref 150–400)
RBC: 4.65 MIL/uL (ref 4.22–5.81)
RDW: 13.6 % (ref 11.5–15.5)
WBC: 5.5 10*3/uL (ref 4.0–10.5)

## 2015-09-01 LAB — URINALYSIS, ROUTINE W REFLEX MICROSCOPIC
BILIRUBIN URINE: NEGATIVE
GLUCOSE, UA: NEGATIVE mg/dL
HGB URINE DIPSTICK: NEGATIVE
Ketones, ur: NEGATIVE mg/dL
Leukocytes, UA: NEGATIVE
Nitrite: NEGATIVE
Protein, ur: NEGATIVE mg/dL
SPECIFIC GRAVITY, URINE: 1.013 (ref 1.005–1.030)
pH: 7 (ref 5.0–8.0)

## 2015-09-01 LAB — PROTIME-INR
INR: 2.6 — ABNORMAL HIGH (ref 0.00–1.49)
PROTHROMBIN TIME: 27.5 s — AB (ref 11.6–15.2)

## 2015-09-01 LAB — TROPONIN I
TROPONIN I: 0.04 ng/mL — AB (ref <0.031)
Troponin I: 0.04 ng/mL — ABNORMAL HIGH (ref <0.031)

## 2015-09-01 LAB — I-STAT TROPONIN, ED: TROPONIN I, POC: 0.04 ng/mL (ref 0.00–0.08)

## 2015-09-01 LAB — T4, FREE: Free T4: 1.56 ng/dL — ABNORMAL HIGH (ref 0.61–1.12)

## 2015-09-01 LAB — BRAIN NATRIURETIC PEPTIDE: B NATRIURETIC PEPTIDE 5: 213.2 pg/mL — AB (ref 0.0–100.0)

## 2015-09-01 MED ORDER — SODIUM CHLORIDE 0.9% FLUSH
3.0000 mL | Freq: Two times a day (BID) | INTRAVENOUS | Status: DC
  Administered 2015-09-01 – 2015-09-02 (×2): 3 mL via INTRAVENOUS

## 2015-09-01 MED ORDER — WARFARIN - PHARMACIST DOSING INPATIENT: Freq: Every day | Status: DC

## 2015-09-01 MED ORDER — SODIUM CHLORIDE 0.9 % IV BOLUS (SEPSIS)
500.0000 mL | Freq: Once | INTRAVENOUS | Status: AC
  Administered 2015-09-01: 500 mL via INTRAVENOUS

## 2015-09-01 MED ORDER — ONDANSETRON HCL 4 MG/2ML IJ SOLN: 4.0000 mg | Freq: Four times a day (QID) | INTRAMUSCULAR | Status: DC | PRN

## 2015-09-01 MED ORDER — ACETAMINOPHEN 500 MG PO TABS: 1000.0000 mg | ORAL_TABLET | Freq: Four times a day (QID) | ORAL | Status: DC | PRN

## 2015-09-01 MED ORDER — IPRATROPIUM BROMIDE 0.06 % NA SOLN
2.0000 | Freq: Two times a day (BID) | NASAL | Status: DC
  Filled 2015-09-01: qty 30

## 2015-09-01 MED ORDER — ALFUZOSIN HCL ER 10 MG PO TB24
10.0000 mg | ORAL_TABLET | Freq: Every day | ORAL | Status: DC
  Administered 2015-09-01: 10 mg via ORAL
  Filled 2015-09-01 (×2): qty 1

## 2015-09-01 MED ORDER — CARVEDILOL 6.25 MG PO TABS
6.2500 mg | ORAL_TABLET | Freq: Two times a day (BID) | ORAL | Status: DC
Start: 2015-09-02 — End: 2015-09-01

## 2015-09-01 MED ORDER — LEVOTHYROXINE SODIUM 75 MCG PO TABS
150.0000 ug | ORAL_TABLET | Freq: Every day | ORAL | Status: DC
  Administered 2015-09-02: 150 ug via ORAL
  Filled 2015-09-01: qty 1
  Filled 2015-09-01: qty 2

## 2015-09-01 MED ORDER — BUDESONIDE-FORMOTEROL FUMARATE 80-4.5 MCG/ACT IN AERO
1.0000 | INHALATION_SPRAY | Freq: Two times a day (BID) | RESPIRATORY_TRACT | Status: DC
  Administered 2015-09-02: 1 via RESPIRATORY_TRACT
  Filled 2015-09-01 (×2): qty 6.9

## 2015-09-01 MED ORDER — NITROGLYCERIN 0.4 MG SL SUBL
0.4000 mg | SUBLINGUAL_TABLET | SUBLINGUAL | Status: DC | PRN
Start: 2015-09-01 — End: 2015-09-02

## 2015-09-01 MED ORDER — HYDROCODONE-ACETAMINOPHEN 5-325 MG PO TABS: 1.0000 | ORAL_TABLET | Freq: Four times a day (QID) | ORAL | Status: DC | PRN

## 2015-09-01 MED ORDER — VITAMIN D 1000 UNITS PO TABS
1000.0000 [IU] | ORAL_TABLET | Freq: Two times a day (BID) | ORAL | Status: DC
  Administered 2015-09-01 – 2015-09-02 (×2): 1000 [IU] via ORAL
  Filled 2015-09-01 (×2): qty 1

## 2015-09-01 MED ORDER — PANTOPRAZOLE SODIUM 40 MG PO TBEC
40.0000 mg | DELAYED_RELEASE_TABLET | Freq: Every day | ORAL | Status: DC
  Administered 2015-09-02: 40 mg via ORAL
  Filled 2015-09-01: qty 1

## 2015-09-01 MED ORDER — CALCIUM CARBONATE 1250 (500 CA) MG PO TABS
1250.0000 mg | ORAL_TABLET | Freq: Two times a day (BID) | ORAL | Status: DC
  Administered 2015-09-02: 1250 mg via ORAL
  Filled 2015-09-01 (×3): qty 1

## 2015-09-01 MED ORDER — ATORVASTATIN CALCIUM 10 MG PO TABS
10.0000 mg | ORAL_TABLET | Freq: Every day | ORAL | Status: DC
  Filled 2015-09-01: qty 1

## 2015-09-01 MED ORDER — SODIUM CHLORIDE 0.9 % IV SOLN: 250.0000 mL | INTRAVENOUS | Status: DC | PRN

## 2015-09-01 MED ORDER — ASPIRIN 300 MG RE SUPP: 300.0000 mg | RECTAL | Status: DC

## 2015-09-01 MED ORDER — NITROGLYCERIN 0.4 MG SL SUBL: 0.4000 mg | SUBLINGUAL_TABLET | SUBLINGUAL | Status: DC | PRN

## 2015-09-01 MED ORDER — RISAQUAD PO CAPS
1.0000 | ORAL_CAPSULE | Freq: Every day | ORAL | Status: DC
  Administered 2015-09-02: 1 via ORAL
  Filled 2015-09-01: qty 1

## 2015-09-01 MED ORDER — CYCLOSPORINE 0.05 % OP EMUL
1.0000 [drp] | Freq: Two times a day (BID) | OPHTHALMIC | Status: DC
Start: 2015-09-01 — End: 2015-09-02
  Administered 2015-09-01 – 2015-09-02 (×2): 1 [drp] via OPHTHALMIC
  Filled 2015-09-01 (×3): qty 1

## 2015-09-01 MED ORDER — ACETAMINOPHEN 325 MG PO TABS: 650.0000 mg | ORAL_TABLET | ORAL | Status: DC | PRN

## 2015-09-01 MED ORDER — POLYETHYLENE GLYCOL 3350 17 G PO PACK: 17.0000 g | PACK | Freq: Every day | ORAL | Status: DC | PRN

## 2015-09-01 MED ORDER — SODIUM CHLORIDE 0.9% FLUSH: 3.0000 mL | INTRAVENOUS | Status: DC | PRN

## 2015-09-01 MED ORDER — WARFARIN SODIUM 5 MG PO TABS
5.0000 mg | ORAL_TABLET | Freq: Once | ORAL | Status: AC
  Administered 2015-09-01: 5 mg via ORAL
  Filled 2015-09-01: qty 1

## 2015-09-01 MED ORDER — CARVEDILOL 6.25 MG PO TABS
6.2500 mg | ORAL_TABLET | Freq: Two times a day (BID) | ORAL | Status: DC
  Administered 2015-09-01 – 2015-09-02 (×2): 6.25 mg via ORAL
  Filled 2015-09-01 (×3): qty 1

## 2015-09-01 MED ORDER — ASPIRIN 81 MG PO CHEW: 324.0000 mg | CHEWABLE_TABLET | ORAL | Status: DC

## 2015-09-01 MED ORDER — ASPIRIN EC 81 MG PO TBEC
81.0000 mg | DELAYED_RELEASE_TABLET | Freq: Every day | ORAL | Status: DC
  Administered 2015-09-02: 81 mg via ORAL
  Filled 2015-09-01: qty 1

## 2015-09-01 NOTE — H&P (Signed)
History and Physical    [name] [id number] DOB: 1932/04/13 DOA: 09/01/2015  PCP: [name], MD   Patient coming from: Home, via cardiology clinic  Chief Complaint: Exertional dyspnea, nausea   HPI: [name] is a 82 y.o. male with medical history significant for chronic atrial fibrillation on Coumadin, severe aortic stenosis status post TAVR, CAD status post CABG, and chronic systolic CHF who presents to the ED with 2 days of exertional dyspnea, generalized weakness, and nausea. Patient reports that he been in his usual state of health until the insidious development of exertional dyspnea with nausea yesterday. He denies any chest pain or palpitations and denies any change in his chronic cough. Patient describes the symptoms as reminiscent of his experience just prior to his aortic valve replacement in 2012. He has known perivalvular regurgitation, documented on TTE in January of this year and followed by cardiology in the outpatient setting. Patient denies any unilateral leg swelling or tenderness, palpitations, hemoptysis, or pleuritic pain. He denies abdominal pain, vomiting, or diarrhea.  ED Course: Upon arrival to the ED, patient is found to beafebrile, saturating well on room air, and with vital signs stable. EKG demonstrates atrial fibrillation with LVH by voltage criteria and anterior Q waves. Chest x-ray is negative for acute cardiopulmonary disease. BMP is notable for serum sodium of 129 which appears to be consistent with prior values. CBC is unremarkable, INR is therapeutic at 2.60, troponin is at the upper limits of normal at 0.04, and BNP is mildly elevated to 213. Urinalysis is unremarkable. Patient was given 500 mL bolus of normal saline in the emergency department and monitored on telemetry with no ischemic changes identified. Given his significant cardiac history and symptoms concerning for possible ACS, patient will be observed on telemetry unit overnight with serial  troponins and plan for updated echocardiogram in the morning.  Review of Systems:  All other systems reviewed and apart from HPI, are negative.  Past Medical History  Diagnosis Date  . Ischemic cardiomyopathy 05/2010    Has EF of 25%  . Diverticulitis     CURRENTLY CONTROLLED WITH NO EVIDENCE OF RECURRENT INFECTION  . AF (atrial fibrillation) ([place])     Has not tolerated amiodarone in the past. Amiodarone was stopped in September of 2010 due to side effects  . Chronic anticoagulation     on coumadin  . Prostate cancer ([place])   . Osteoarthritis     RIGHT KNEE  . Aortic stenosis, severe     WITH PERCUTANEOUS AORTIC VALVE (TAVI) IN March 2012 at the [facility]  . MI (myocardial infarction) ([place]) 1974, 1978  . Cervical myelopathy ([place])   . Pulmonary embolism ([place])   . Hyperlipidemia   . GERD (gastroesophageal reflux disease)   . Esophageal stricture     WITH DILATATION  . NSVT (nonsustained ventricular tachycardia) ([place])   . Coronary artery disease   . Heart murmur   . Osteoporosis   . Stroke [facility])     3 strokes  . CHF (congestive heart failure) [facility])     Past Surgical History  Procedure Laterality Date  . Icd  Feb 2003; 02/2013    gen change 02-11-2013 by Dr Caryl Comes  . Coronary artery bypass graft  1979  . Coronary artery bypass graft  1991    REDO SURGERY  . Cardiac catheterization  2010    SEVERE LV DYSFUNCTION WITH ESTIMATED EJECTION FRACTION OF 25%  . Cholecystectomy    . Aortic valve replacement  Percutaneous AVR in March 2012 at the [facility]  . Cardioversion  02/16/2011    Procedure: CARDIOVERSION;  Surgeon: Carlena Bjornstad, MD;  Location: [place];  Service: Cardiovascular;  Laterality: N/A;  . Shoulder surgery    . Back surgery    . Implantable cardioverter defibrillator (icd) generator change N/A 02/10/2013    Procedure: ICD GENERATOR CHANGE;  Surgeon: Deboraha Sprang, MD;  Location: [facility] CATH LAB;  Service: Cardiovascular;  Laterality: N/A;      reports that he quit smoking about 46 years ago. His smoking use included Cigarettes. He has a 60 pack-year smoking history. He has never used smokeless tobacco. He reports that he does not drink alcohol or use illicit drugs.  Allergies  Allergen Reactions  . Penicillins Shortness Of Breath and Rash    Has patient had a PCN reaction causing immediate rash, facial/tongue/throat swelling, SOB or lightheadedness with hypotension: Yes Has patient had a PCN reaction causing severe rash involving mucus membranes or skin necrosis: No Has patient had a PCN reaction that required hospitalization No Has patient had a PCN reaction occurring within the last 10 years: No If all of the above answers are "NO", then may proceed with Cephalosporin use.   . Tizanidine Shortness Of Breath    Light headed  . Ace Inhibitors     Has not tolerated in the past due to hyperkalemia  . Antihistamines, Diphenhydramine-Type Other (See Comments)    Inhibits urination  . Amiodarone Other (See Comments)    Unknown     Family History  Problem Relation Age of Onset  . Heart disease Mother 88  . Diabetes Mother 50  . Heart disease Father 25     Prior to Admission medications   Medication Sig Start Date End Date Taking? Authorizing Provider  acetaminophen (TYLENOL) 500 MG tablet Take 1,000 mg by mouth every 6 (six) hours as needed.   Yes Historical Provider, MD  alfuzosin (UROXATRAL) 10 MG 24 hr tablet Take 10 mg by mouth at bedtime.    Yes Historical Provider, MD  aspirin 81 MG tablet Take 81 mg by mouth daily.   Yes Historical Provider, MD  atorvastatin (LIPITOR) 10 MG tablet TAKE ONE TABLET EVERY OTHER DAY. 04/15/15  Yes Sherren Mocha, MD  Budesonide-Formoterol Fumarate (SYMBICORT IN) Inhale 1 puff into the lungs 2 (two) times daily.   Yes Historical Provider, MD  calcium carbonate (OS-CAL) 600 MG TABS tablet Take 600 mg by mouth 2 (two) times daily with a meal. 600 +D   Yes Historical Provider, MD  carvedilol  (COREG) 6.25 MG tablet Take 1 tablet (6.25 mg total) by mouth 2 (two) times daily with a meal. 07/29/15  Yes Sherren Mocha, MD  cholecalciferol (VITAMIN D) 1000 UNITS tablet Take 1,000 Units by mouth 2 (two) times daily.   Yes Historical Provider, MD  COUMADIN 5 MG tablet TAKE AS DIRECTED BY COUMADIN CLINIC. Patient taking differently: Take 5 mg daily 06/24/15  Yes Sherren Mocha, MD  furosemide (LASIX) 20 MG tablet Take 1 tablet (20 mg total) by mouth daily as needed (for swelling). 03/19/15  Yes Sherren Mocha, MD  HYDROcodone-acetaminophen (NORCO/VICODIN) 5-325 MG per tablet Take 1 tablet by mouth every 6 (six) hours as needed for moderate pain. 01/20/14  Yes Jessica U Vann, DO  ipratropium (ATROVENT) 0.03 % nasal spray Place 2 sprays into both nostrils 2 (two) times daily.   Yes Historical Provider, MD  levothyroxine (SYNTHROID, LEVOTHROID) 150 MCG tablet Take 150 mcg by  mouth daily before breakfast.   Yes Historical Provider, MD  NITROSTAT 0.4 MG SL tablet DISSOLVE 1 TABLET UNDER TONGUE AS NEEDED FOR CHEST PAIN,MAY REPEAT IN5 MINUTES FOR 2 DOSES. 07/26/15  Yes Sherren Mocha, MD  omeprazole (PRILOSEC) 20 MG capsule Take 20 mg by mouth daily as needed (acid reflux).   Yes Historical Provider, MD  polyethylene glycol (MIRALAX / GLYCOLAX) packet Take 17 g by mouth daily as needed for mild constipation.    Yes Historical Provider, MD  Probiotic Product (PROBIOTIC DAILY PO) Take 1 capsule by mouth daily.   Yes Historical Provider, MD  RESTASIS 0.05 % ophthalmic emulsion Place 1 drop into both eyes every 12 (twelve) hours.  12/05/11  Yes Historical Provider, MD    Physical Exam: Filed Vitals:   09/01/15 1900 09/01/15 1915 09/01/15 1930 09/01/15 1945  BP: 133/87 111/71 111/75 115/67  Pulse: 58  79   Temp:      TempSrc:      Resp: 15 16 17 18   SpO2: 98% 99% 98% 97%      Constitutional: NAD, calm, comfortable Eyes: PERTLA, lids and conjunctivae normal ENMT: Mucous membranes are moist. Posterior  pharynx clear of any exudate or lesions.   Neck: normal, supple, no masses, no thyromegaly Respiratory: clear to auscultation bilaterally, no wheezing, no crackles. Normal respiratory effort.   Cardiovascular: S1 & S2 heard, regular rate and rhythm, soft systolic murmur at apex. No extremity edema. No significant JVD. Abdomen: No distension, no tenderness, no masses palpated. Bowel sounds normal.  Musculoskeletal: no clubbing / cyanosis. No joint deformity upper and lower extremities. Normal muscle tone.  Skin: no significant rashes, lesions, ulcers. Warm, dry, well-perfused. Neurologic: CN 2-12 grossly intact. Sensation intact, DTR normal. Strength 5/5 in all 4 limbs.  Psychiatric: Normal judgment and insight. Alert and oriented x 3. Normal mood and affect.     Labs on Admission: I have personally reviewed following labs and imaging studies  CBC:  Recent Labs Lab 09/01/15 1420  WBC 5.5  HGB 13.7  HCT 42.4  MCV 91.2  PLT [id number]   Basic Metabolic Panel:  Recent Labs Lab 09/01/15 1420  NA 129*  K 4.0  CL 94*  CO2 27  GLUCOSE 89  BUN 13  CREATININE 1.10  CALCIUM 8.6*   GFR: CrCl cannot be calculated (Unknown ideal weight.). Liver Function Tests: No results for input(s): AST, ALT, ALKPHOS, BILITOT, PROT, ALBUMIN in the last 168 hours. No results for input(s): LIPASE, AMYLASE in the last 168 hours. No results for input(s): AMMONIA in the last 168 hours. Coagulation Profile:  Recent Labs Lab 08/31/15 09/01/15 1420  INR 4.1 2.60*   Cardiac Enzymes:  Recent Labs Lab 09/01/15 1734  TROPONINI 0.04*   BNP (last 3 results) No results for input(s): PROBNP in the last 8760 hours. HbA1C: No results for input(s): HGBA1C in the last 72 hours. CBG: No results for input(s): GLUCAP in the last 168 hours. Lipid Profile: No results for input(s): CHOL, HDL, LDLCALC, TRIG, CHOLHDL, LDLDIRECT in the last 72 hours. Thyroid Function Tests: No results for input(s): TSH, T4TOTAL,  FREET4, T3FREE, THYROIDAB in the last 72 hours. Anemia Panel: No results for input(s): VITAMINB12, FOLATE, FERRITIN, TIBC, IRON, RETICCTPCT in the last 72 hours. Urine analysis:    Component Value Date/Time   COLORURINE YELLOW 09/01/2015 1751   APPEARANCEUR CLEAR 09/01/2015 1751   LABSPEC 1.013 09/01/2015 1751   LABSPEC 1.010 11/16/2006 1032   PHURINE 7.0 09/01/2015 1751   PHURINE 6.0 11/16/2006  [place] 09/01/2015 1751   HGBUR NEGATIVE 09/01/2015 1751   HGBUR Large 11/16/2006 [place] 09/01/2015 1751   BILIRUBINUR Negative 11/16/2006 [place] 09/01/2015 1751   KETONESUR Negative 11/16/2006 1032   PROTEINUR NEGATIVE 09/01/2015 1751   PROTEINUR Negative 11/16/2006 1032   UROBILINOGEN 0.2 11/25/2014 0510   NITRITE NEGATIVE 09/01/2015 1751   NITRITE Negative 11/16/2006 1032   LEUKOCYTESUR NEGATIVE 09/01/2015 1751   LEUKOCYTESUR Trace 11/16/2006 1032   Sepsis Labs: @LABRCNTIP (procalcitonin:4,lacticidven:4) )No results found for this or any previous visit (from the past 240 hour(s)).   Radiological Exams on Admission: Dg Chest 2 View  09/01/2015  CLINICAL DATA:  Pt reports weakness and fatigue onset yesterday; he denies CP or SOB; he reports feeling like he did just before having aortic valve replacement 5 years ago; h/o ICD insertion, AFIB, and CHF also; former smoker EXAM: CHEST  2 VIEW COMPARISON:  07/11/2015 FINDINGS: Prior median sternotomy. Artifact degradation posteriorly on the lateral view. There is an incompletely imaged upper lumbar vertebral compression deformity. Pacer/AICD device. Midline trachea. Mild cardiomegaly. Atherosclerosis in the transverse aorta. No pleural effusion or pneumothorax. Chronic mild pulmonary interstitial thickening. No overt congestive failure. No lobar consolidation. Aortic valve repair. IMPRESSION: No acute cardiopulmonary disease. Cardiomegaly without congestive failure. Aortic atherosclerosis.  Electronically Signed   By: Abigail Miyamoto M.D.   On: 09/01/2015 14:52    EKG: Independently reviewed. Atrial fibrillation, LVH by voltage criteria, anterior Q-wave  Assessment/Plan  1. Exertional dyspnea, nausea - CXR clear and pt saturating well on rm air, but there is concern that this could represent an anginal equivalent  - EKG with no acute ischemic features, initial troponin 0.04  - ASA 324 mg given at time of admission; continuing his beta-blocker and statin  - Monitor on telemetry; obtain serial troponin measurements; update TTE    2. Chronic atrial fibrillation  - CHADS-VASc is 56 (age x2, CHF, CAD)  - Continue AC with warfarin, INR 2.60 on admission  - Rate well-controlled with Coreg, will continue current dose  - Monitoring on telemetry    3. Chronic systolic CHF  - Appears euvolemic at time of admission; BNP elevated to 213  - TTE (03/16/15) with EF 20-25%; mod LAE; akinesis of inferior, septal, and apical wall; aortic perivalvular regurgitation  - Managed with Lasix and Coreg at home, will continue  - TTE in am    4. CAD - Status-post remote CABG with revision in [place] with ASA 81, Coreg, Lipitor, prn NTG  - Ruling-out ACS as above - Continue current management  5. GERD - Stable, managed with Prilosec at home, will continue PPI   6. Hyponatremia  - Serum sodium 129 on admission - Per chart review, this appears chronic and stable  - Likely secondary to systolic CHF and associated effective arterial vol depletion   7. Hypothyroidism  - Appears to be stable  - Continue current-dose Synthroid    8. Aortic stenosis s/p TAVR - Underwent TAVI at [facility] in 2012  - Perivalvular regurgitation noted on TTE from January '17, described as moderate at that time  - TTE ordered    DVT prophylaxis: Coumadin  Code Status: Full  Family Communication: Wife updated at bedside  Disposition Plan: Observe on telemetry   Consults called: None   Admission  status: Observation     Jeffrey Bulls, MD [facility] Pager [phone]  If 7PM-7AM, please contact night-coverage [email or web address] Password [email or web address]  09/01/2015, 7:51 PM

## 2015-09-01 NOTE — ED Provider Notes (Signed)
CSN: [id number]     Arrival date & time 09/01/15  1412 History   First MD Initiated Contact with Patient 09/01/15 1439     Chief Complaint  Patient presents with  . Shortness of Breath  . Fatigue     (Consider location/radiation/quality/duration/timing/severity/associated sxs/prior Treatment) HPI Comments: [name] is a 82 y.o. male with history of pacemaker, mechanical aortic valve s/p 2012, MI x 4, CVA x 3, atrial fibrillation on coumadin, hyperlipidemia, and osteoarthritis presents to ED via EMS with complaint of generalized weakness and shortness of breath. Symptoms started yesterday. He states he felt this way before his aortic valve replacement. He endorses a chronic non-productive cough. He denies chest pain or lower extremity swelling at this time. However, he has had some indigestion, described as a "discomfort" that is worse with exertion and dissipates with rest and maalox. He also has a history of PE while on coumadin.  He has a history of prostate cancer, radiation therapy was completed six years ago, he goes to regular follow up appointments and is in remission. He denies any recent long distance travel/hospitalizations/immobilizations. He denies hemoptysis. He also endorses dysuria, no hematuria.   Of note, his INR yesterday was 4.1, his Coumadin was held. Attributes the abnormality to "not eating right."  Patient is a 82 y.o. male presenting with shortness of breath. The history is provided by the patient.  Shortness of Breath   Past Medical History  Diagnosis Date  . Ischemic cardiomyopathy 05/2010    Has EF of 25%  . Diverticulitis     CURRENTLY CONTROLLED WITH NO EVIDENCE OF RECURRENT INFECTION  . AF (atrial fibrillation) ([place])     Has not tolerated amiodarone in the past. Amiodarone was stopped in September of 2010 due to side effects  . Chronic anticoagulation     on coumadin  . Prostate cancer ([place])   . Osteoarthritis     RIGHT KNEE  . Aortic stenosis, severe      WITH PERCUTANEOUS AORTIC VALVE (TAVI) IN March 2012 at the [facility]  . MI (myocardial infarction) ([place]) 1974, 1978  . Cervical myelopathy ([place])   . Pulmonary embolism ([place])   . Hyperlipidemia   . GERD (gastroesophageal reflux disease)   . Esophageal stricture     WITH DILATATION  . NSVT (nonsustained ventricular tachycardia) ([place])   . Coronary artery disease   . Heart murmur   . Osteoporosis   . Stroke [facility])     3 strokes  . CHF (congestive heart failure) [facility])    Past Surgical History  Procedure Laterality Date  . Icd  Feb 2003; 02/2013    gen change 02-11-2013 by Dr Caryl Comes  . Coronary artery bypass graft  1979  . Coronary artery bypass graft  1991    REDO SURGERY  . Cardiac catheterization  2010    SEVERE LV DYSFUNCTION WITH ESTIMATED EJECTION FRACTION OF 25%  . Cholecystectomy    . Aortic valve replacement      Percutaneous AVR in March 2012 at the [facility]  . Cardioversion  02/16/2011    Procedure: CARDIOVERSION;  Surgeon: Carlena Bjornstad, MD;  Location: [place];  Service: Cardiovascular;  Laterality: N/A;  . Shoulder surgery    . Back surgery    . Implantable cardioverter defibrillator (icd) generator change N/A 02/10/2013    Procedure: ICD GENERATOR CHANGE;  Surgeon: Deboraha Sprang, MD;  Location: [facility] CATH LAB;  Service: Cardiovascular;  Laterality: N/A;   Family History  Problem Relation Age of Onset  . Heart disease Mother 4  . Diabetes Mother 57  . Heart disease Father 39   Social History  Substance Use Topics  . Smoking status: Former Smoker -- 4.00 packs/day for 15 years    Types: Cigarettes    Quit date: 06/15/1969  . Smokeless tobacco: Never Used  . Alcohol Use: No    Review of Systems  Respiratory: Positive for shortness of breath.   Gastrointestinal: Positive for nausea.  Genitourinary: Positive for dysuria ( on occasion).  Neurological: Positive for weakness ( generalized).  All other systems reviewed and are  negative.     Allergies  Penicillins; Tizanidine; Ace inhibitors; Antihistamines, diphenhydramine-type; and Amiodarone  Home Medications   Prior to Admission medications   Medication Sig Start Date End Date Taking? Authorizing Provider  acetaminophen (TYLENOL) 500 MG tablet Take 1,000 mg by mouth every 6 (six) hours as needed.   Yes Historical Provider, MD  alfuzosin (UROXATRAL) 10 MG 24 hr tablet Take 10 mg by mouth at bedtime.    Yes Historical Provider, MD  aspirin 81 MG tablet Take 81 mg by mouth daily.   Yes Historical Provider, MD  atorvastatin (LIPITOR) 10 MG tablet TAKE ONE TABLET EVERY OTHER DAY. 04/15/15  Yes Sherren Mocha, MD  Budesonide-Formoterol Fumarate (SYMBICORT IN) Inhale 1 puff into the lungs 2 (two) times daily.   Yes Historical Provider, MD  calcium carbonate (OS-CAL) 600 MG TABS tablet Take 600 mg by mouth 2 (two) times daily with a meal. 600 +D   Yes Historical Provider, MD  carvedilol (COREG) 6.25 MG tablet Take 1 tablet (6.25 mg total) by mouth 2 (two) times daily with a meal. 07/29/15  Yes Sherren Mocha, MD  cholecalciferol (VITAMIN D) 1000 UNITS tablet Take 1,000 Units by mouth 2 (two) times daily.   Yes Historical Provider, MD  COUMADIN 5 MG tablet TAKE AS DIRECTED BY COUMADIN CLINIC. Patient taking differently: Take 5 mg daily 06/24/15  Yes Sherren Mocha, MD  furosemide (LASIX) 20 MG tablet Take 1 tablet (20 mg total) by mouth daily as needed (for swelling). 03/19/15  Yes Sherren Mocha, MD  HYDROcodone-acetaminophen (NORCO/VICODIN) 5-325 MG per tablet Take 1 tablet by mouth every 6 (six) hours as needed for moderate pain. 01/20/14  Yes Jessica U Vann, DO  ipratropium (ATROVENT) 0.03 % nasal spray Place 2 sprays into both nostrils 2 (two) times daily.   Yes Historical Provider, MD  levothyroxine (SYNTHROID, LEVOTHROID) 150 MCG tablet Take 150 mcg by mouth daily before breakfast.   Yes Historical Provider, MD  NITROSTAT 0.4 MG SL tablet DISSOLVE 1 TABLET UNDER  TONGUE AS NEEDED FOR CHEST PAIN,MAY REPEAT IN5 MINUTES FOR 2 DOSES. 07/26/15  Yes Sherren Mocha, MD  omeprazole (PRILOSEC) 20 MG capsule Take 20 mg by mouth daily as needed (acid reflux).   Yes Historical Provider, MD  polyethylene glycol (MIRALAX / GLYCOLAX) packet Take 17 g by mouth daily as needed for mild constipation.    Yes Historical Provider, MD  Probiotic Product (PROBIOTIC DAILY PO) Take 1 capsule by mouth daily.   Yes Historical Provider, MD  RESTASIS 0.05 % ophthalmic emulsion Place 1 drop into both eyes every 12 (twelve) hours.  12/05/11  Yes Historical Provider, MD   BP 117/59 mmHg  Pulse 71  Temp(Src) 97.8 F (36.6 C) (Oral)  Resp 17  SpO2 97% Physical Exam  Constitutional: He appears well-developed and well-nourished. No distress.  HENT:  Head: Normocephalic and atraumatic.  Mouth/Throat: Oropharynx is  clear and moist. No oropharyngeal exudate.  Eyes: Conjunctivae and EOM are normal. Pupils are equal, round, and reactive to light. Right eye exhibits no discharge. Left eye exhibits no discharge. No scleral icterus.  Neck: Normal range of motion. Neck supple.  Cardiovascular: Normal rate, regular rhythm, normal heart sounds and intact distal pulses.   Systolic ejection murmur  Pulmonary/Chest: Effort normal and breath sounds normal. No respiratory distress.  Abdominal: Soft. Bowel sounds are normal. There is no tenderness. There is no rebound and no guarding.  Musculoskeletal: Normal range of motion.  Lymphadenopathy:    He has no cervical adenopathy.  Neurological: He is alert. Coordination normal.  Skin: Skin is warm and dry. He is not diaphoretic.  Psychiatric: He has a normal mood and affect.    ED Course  Procedures (including critical care time) Labs Review Labs Reviewed  BASIC METABOLIC PANEL - Abnormal; Notable for the following:    Sodium 129 (*)    Chloride 94 (*)    Calcium 8.6 (*)    All other components within normal limits  PROTIME-INR - Abnormal;  Notable for the following:    Prothrombin Time 27.5 (*)    INR 2.60 (*)    All other components within normal limits  BRAIN NATRIURETIC PEPTIDE - Abnormal; Notable for the following:    B Natriuretic Peptide 213.2 (*)    All other components within normal limits  TROPONIN I - Abnormal; Notable for the following:    Troponin I 0.04 (*)    All other components within normal limits  CBC  URINALYSIS, ROUTINE W REFLEX MICROSCOPIC (NOT AT [facility])  Randolm Idol, ED    Imaging Review Dg Chest 2 View  09/01/2015  CLINICAL DATA:  Pt reports weakness and fatigue onset yesterday; he denies CP or SOB; he reports feeling like he did just before having aortic valve replacement 5 years ago; h/o ICD insertion, AFIB, and CHF also; former smoker EXAM: CHEST  2 VIEW COMPARISON:  07/11/2015 FINDINGS: Prior median sternotomy. Artifact degradation posteriorly on the lateral view. There is an incompletely imaged upper lumbar vertebral compression deformity. Pacer/AICD device. Midline trachea. Mild cardiomegaly. Atherosclerosis in the transverse aorta. No pleural effusion or pneumothorax. Chronic mild pulmonary interstitial thickening. No overt congestive failure. No lobar consolidation. Aortic valve repair. IMPRESSION: No acute cardiopulmonary disease. Cardiomegaly without congestive failure. Aortic atherosclerosis. Electronically Signed   By: Abigail Miyamoto M.D.   On: 09/01/2015 14:52   I have personally reviewed and evaluated these images and lab results as part of my medical decision-making.   EKG Interpretation None      MDM   Final diagnoses:  Shortness of breath  Weakness    Patient is afebrile and non-toxic appearing. His vital signs are stable. CBC re-assuring, low suspicion for anemia. BMP remarkable for hyponatremia; however, review of records indicate he is chronically hyponatremic. U/A negative for UTI. INR therapeutic at 2.6. CXR negative for acute cardiopulmonary process. BNP mildly elevated;  however, do not think this is an acute exacerbation of CHF.Troponin negative. EKG shows LVH and secondary repolarization abnormality. Heart score 6. Discussed patient with Dr. Tomi Bamberger, who also evaluated, agrees with plan. Will repeat troponin. Given significant cardiac history will consult TRH for admission for cardiac r/o.   6:35 PM: Consult to Dr. Myna Hidalgo of [facility] Will admit to telemetry observation for cardiac r/o.     Roxanna Mew, [place] 09/01/15 1840  Dorie Rank, MD 09/01/15 [phone]

## 2015-09-01 NOTE — ED Notes (Signed)
Pt reports to the ED for eval of generalized weakness and SOB. PT presents via [facility] from his cardiologists office. Pt reports he has been having the SOB and gen weakness since yesterday morning. Pt has significant cardiac hx. Pt reports these symptoms feel like he did when he had to have a heart valve replaced. VSS en route. 12 lead unremarkable en route as well. Pt was noted to be in A. Fib but it was rate controlled between 70s-90s. Denies any CP. Pt A&Ox4, resp e/u, and skin warm and dry. CBG 109 mg/dl for EMS.

## 2015-09-01 NOTE — Telephone Encounter (Signed)
Patient was a walk-in to [facility] office this afternoon. Patient complained of weakness, SOB, and nausea. Patient stated that he is feeling the same as he felt before he had his valve replacement. Patient has history of A. Fib, CHF, ischemic cardiomyopathy, CAD, aortic valve replacement 2012, PE, and CABG 1979.  Last INR 4.1 on 08/31/15. Patient's vital signs stable. Informed patient about our walk-in policy, and encouraged patient that he needed to be seen in ED. Called EMS to transport patient, with patient's extensive heart history. Patient agreed to plan.

## 2015-09-01 NOTE — Progress Notes (Signed)
ANTICOAGULATION CONSULT NOTE - Initial Consult  Pharmacy Consult for warfarin Indication: atrial fibrillation  Allergies  Allergen Reactions  . Penicillins Shortness Of Breath and Rash    Has patient had a PCN reaction causing immediate rash, facial/tongue/throat swelling, SOB or lightheadedness with hypotension: Yes Has patient had a PCN reaction causing severe rash involving mucus membranes or skin necrosis: No Has patient had a PCN reaction that required hospitalization No Has patient had a PCN reaction occurring within the last 10 years: No If all of the above answers are "NO", then may proceed with Cephalosporin use.   . Tizanidine Shortness Of Breath    Light headed  . Ace Inhibitors     Has not tolerated in the past due to hyperkalemia  . Antihistamines, Diphenhydramine-Type Other (See Comments)    Inhibits urination  . Amiodarone Other (See Comments)    Unknown     Patient Measurements:    Vital Signs: Temp: 97.8 F (36.6 C) (06/21 1417) Temp Source: Oral (06/21 1417) BP: 115/67 mmHg (06/21 1945) Pulse Rate: 79 (06/21 1930)  Labs:  Recent Labs  08/31/15 09/01/15 1420 09/01/15 1734  HGB  --  13.7  --   HCT  --  42.4  --   PLT  --  167  --   LABPROT  --  27.5*  --   INR 4.1 2.60*  --   CREATININE  --  1.10  --   TROPONINI  --   --  0.04*    CrCl cannot be calculated (Unknown ideal weight.).   Medical History: Past Medical History  Diagnosis Date  . Ischemic cardiomyopathy 05/2010    Has EF of 25%  . Diverticulitis     CURRENTLY CONTROLLED WITH NO EVIDENCE OF RECURRENT INFECTION  . AF (atrial fibrillation) ([place])     Has not tolerated amiodarone in the past. Amiodarone was stopped in September of 2010 due to side effects  . Chronic anticoagulation     on coumadin  . Prostate cancer ([place])   . Osteoarthritis     RIGHT KNEE  . Aortic stenosis, severe     WITH PERCUTANEOUS AORTIC VALVE (TAVI) IN March 2012 at the [facility]  . MI (myocardial  infarction) ([place]) 1974, 1978  . Cervical myelopathy ([place])   . Pulmonary embolism ([place])   . Hyperlipidemia   . GERD (gastroesophageal reflux disease)   . Esophageal stricture     WITH DILATATION  . NSVT (nonsustained ventricular tachycardia) ([place])   . Coronary artery disease   . Heart murmur   . Osteoporosis   . Stroke [facility])     3 strokes  . CHF (congestive heart failure) (HCC)    Assessment: 70 yom presented to the ED with SOB. He is on chronic coumadin for history of afib. INR is therapeutic today at 2.6 and H/H + plts are WNL. No bleeding noted.   Goal of Therapy:  INR 2-3 Monitor platelets by anticoagulation protocol: Yes   Plan:  - Warfarin 5mg  PO x 1 tonight - Daily INR  Josha Weekley, Rande Lawman 09/01/2015,7:56 PM

## 2015-09-01 NOTE — ED Notes (Signed)
Called [employer] to order supper tray.

## 2015-09-01 NOTE — ED Notes (Signed)
Phlebotomy at bedside at this time.

## 2015-09-02 ENCOUNTER — Observation Stay (HOSPITAL_BASED_OUTPATIENT_CLINIC_OR_DEPARTMENT_OTHER): Payer: Medicare Other

## 2015-09-02 ENCOUNTER — Other Ambulatory Visit: Payer: Self-pay

## 2015-09-02 DIAGNOSIS — Z952 Presence of prosthetic heart valve: Secondary | ICD-10-CM | POA: Diagnosis not present

## 2015-09-02 DIAGNOSIS — Z5181 Encounter for therapeutic drug level monitoring: Secondary | ICD-10-CM | POA: Diagnosis not present

## 2015-09-02 DIAGNOSIS — R0600 Dyspnea, unspecified: Secondary | ICD-10-CM

## 2015-09-02 DIAGNOSIS — I255 Ischemic cardiomyopathy: Secondary | ICD-10-CM | POA: Diagnosis not present

## 2015-09-02 DIAGNOSIS — E871 Hypo-osmolality and hyponatremia: Secondary | ICD-10-CM | POA: Diagnosis not present

## 2015-09-02 DIAGNOSIS — R0609 Other forms of dyspnea: Secondary | ICD-10-CM

## 2015-09-02 DIAGNOSIS — I482 Chronic atrial fibrillation: Secondary | ICD-10-CM | POA: Diagnosis not present

## 2015-09-02 DIAGNOSIS — Z7901 Long term (current) use of anticoagulants: Secondary | ICD-10-CM

## 2015-09-02 DIAGNOSIS — R0602 Shortness of breath: Secondary | ICD-10-CM | POA: Insufficient documentation

## 2015-09-02 DIAGNOSIS — E039 Hypothyroidism, unspecified: Secondary | ICD-10-CM | POA: Diagnosis not present

## 2015-09-02 DIAGNOSIS — I5022 Chronic systolic (congestive) heart failure: Secondary | ICD-10-CM | POA: Diagnosis not present

## 2015-09-02 DIAGNOSIS — K219 Gastro-esophageal reflux disease without esophagitis: Secondary | ICD-10-CM | POA: Diagnosis not present

## 2015-09-02 DIAGNOSIS — I2510 Atherosclerotic heart disease of native coronary artery without angina pectoris: Secondary | ICD-10-CM | POA: Diagnosis not present

## 2015-09-02 DIAGNOSIS — I350 Nonrheumatic aortic (valve) stenosis: Secondary | ICD-10-CM | POA: Diagnosis not present

## 2015-09-02 DIAGNOSIS — Z951 Presence of aortocoronary bypass graft: Secondary | ICD-10-CM | POA: Diagnosis not present

## 2015-09-02 LAB — BASIC METABOLIC PANEL
Anion gap: 6 (ref 5–15)
Anion gap: 5 (ref 5–15)
BUN: 15 mg/dL (ref 6–20)
BUN: 15 mg/dL (ref 6–20)
CALCIUM: 8.6 mg/dL — AB (ref 8.9–10.3)
CHLORIDE: 96 mmol/L — AB (ref 101–111)
CHLORIDE: 96 mmol/L — AB (ref 101–111)
CO2: 26 mmol/L (ref 22–32)
CO2: 30 mmol/L (ref 22–32)
CREATININE: 1.1 mg/dL (ref 0.61–1.24)
CREATININE: 1.18 mg/dL (ref 0.61–1.24)
Calcium: 8.1 mg/dL — ABNORMAL LOW (ref 8.9–10.3)
GFR calc Af Amer: >60 mL/min (ref >60)
GFR calc Af Amer: >60 mL/min (ref >60)
GFR calc non Af Amer: 56 mL/min — ABNORMAL LOW (ref >60)
GLUCOSE: 105 mg/dL — AB (ref 65–99)
GLUCOSE: 89 mg/dL (ref 65–99)
POTASSIUM: 3.9 mmol/L (ref 3.5–5.1)
Potassium: 4.5 mmol/L (ref 3.5–5.1)
Sodium: 128 mmol/L — ABNORMAL LOW (ref 135–145)
Sodium: 131 mmol/L — ABNORMAL LOW (ref 135–145)

## 2015-09-02 LAB — ECHOCARDIOGRAM COMPLETE
AO mean calculated velocity dopler: 122 cm/s
AOPV: 0.59 m/s
AV Peak grad: 11 mmHg
AV VEL mean LVOT/AV: 0.49
AV pk vel: 167 cm/s
AVG: 7 mmHg
CHL CUP DOP CALC LVOT VTI: 17.2 cm
EWDT: 257 ms
FS: 13 % — AB (ref 28–44)
Height: 75 in
IV/PV OW: 1.31
LA diam end sys: 49 mm
LA vol A4C: 72.6 ml
LADIAMINDEX: 2.33 cm/m2
LASIZE: 49 mm
LAVOL: 86.8 mL
LAVOLIN: 41.2 mL/m2
LV SIMPSON'S DISK: 24
LV dias vol index: 80 mL/m2
LVDIAVOL: 168 mL — AB (ref 62–150)
LVOT peak VTI: 0.52 cm
LVOTPV: 98.5 cm/s
LVSYSVOL: 128 mL — AB (ref 21–61)
LVSYSVOLIN: 61 mL/m2
MV Dec: 257
MVPKEVEL: 64.7 m/s
PW: 9.85 mm — AB (ref 0.6–1.1)
Reg peak vel: 212 cm/s
Stroke v: 40 ml
TRMAXVEL: 212 cm/s
VTI: 33.1 cm
Weight: 2958.4 oz

## 2015-09-02 LAB — TSH: TSH: 0.17 u[IU]/mL — AB (ref 0.350–4.500)

## 2015-09-02 LAB — PROTIME-INR
INR: 2.52 — ABNORMAL HIGH (ref 0.00–1.49)
PROTHROMBIN TIME: 26.9 s — AB (ref 11.6–15.2)

## 2015-09-02 LAB — TROPONIN I
TROPONIN I: 0.03 ng/mL (ref <0.031)
Troponin I: 0.03 ng/mL (ref <0.031)

## 2015-09-02 MED ORDER — LEVOTHYROXINE SODIUM 125 MCG PO TABS: 125.0000 ug | ORAL_TABLET | Freq: Every day | ORAL | Status: AC

## 2015-09-02 MED ORDER — WARFARIN SODIUM 5 MG PO TABS
5.0000 mg | ORAL_TABLET | Freq: Once | ORAL | Status: DC
  Filled 2015-09-02: qty 1

## 2015-09-02 MED ORDER — FUROSEMIDE 20 MG PO TABS
20.0000 mg | ORAL_TABLET | Freq: Every day | ORAL | Status: DC
  Administered 2015-09-02: 20 mg via ORAL
  Filled 2015-09-02: qty 1

## 2015-09-02 NOTE — Care Management Obs Status (Signed)
[place] NOTIFICATION   Patient Details  Name: [name] MRN: [id number] Date of Birth: 04-13-1932   Medicare Observation Status Notification Given:  Yes    Royston Bake, RN 09/02/2015, 2:16 PM

## 2015-09-02 NOTE — Progress Notes (Signed)
ANTICOAGULATION CONSULT NOTE - Consult  Pharmacy Consult for warfarin Indication: atrial fibrillation  Allergies  Allergen Reactions  . Penicillins Shortness Of Breath and Rash    Has patient had a PCN reaction causing immediate rash, facial/tongue/throat swelling, SOB or lightheadedness with hypotension: Yes Has patient had a PCN reaction causing severe rash involving mucus membranes or skin necrosis: No Has patient had a PCN reaction that required hospitalization No Has patient had a PCN reaction occurring within the last 10 years: No If all of the above answers are "NO", then may proceed with Cephalosporin use.   . Tizanidine Shortness Of Breath    Light headed  . Ace Inhibitors     Has not tolerated in the past due to hyperkalemia  . Antihistamines, Diphenhydramine-Type Other (See Comments)    Inhibits urination  . Amiodarone Other (See Comments)    Unknown     Patient Measurements: Height: 6\' 3"  (190.5 cm) Weight: 184 lb 14.4 oz (83.87 kg) (Scale B) IBW/kg (Calculated) : 84.5  Vital Signs: Temp: 97.7 F (36.5 C) (06/22 1302) Temp Source: Oral (06/22 1302) BP: 103/56 mmHg (06/22 1302) Pulse Rate: 61 (06/22 1302)  Labs:  Recent Labs  08/31/15 09/01/15 1420  09/01/15 2010 09/02/15 0214 09/02/15 0237 09/02/15 0820  HGB  --  13.7  --   --   --   --   --   HCT  --  42.4  --   --   --   --   --   PLT  --  167  --   --   --   --   --   LABPROT  --  27.5*  --   --  26.9*  --   --   INR 4.1 2.60*  --   --  2.52*  --   --   CREATININE  --  1.10  --   --  1.10  --   --   TROPONINI  --   --   < > 0.04*  --  0.03 0.03  < > = values in this interval not displayed.  Estimated Creatinine Clearance: 62.5 mL/min (by C-G formula based on Cr of 1.1).  Assessment: CC: SOB, fatigue  PMH: ICM, afib, diverticulitis, prostate ca, OA, AS,m PE, HLD, GERd, esophageal stricture, CAD, CHF, CVA, mech AVR  AC: Warfarin for hx afib (INR goal 2.5-3 per [facility] clinic note), hx PE + mech AVR  - INR 2.52  PTA Warfarin 5 mg daily  Renal: SCr 1.1  Heme: H&H 13.7/42.4, Plt 167  Goal of Therapy:  INR 2.5 - 3  Monitor platelets by anticoagulation protocol: Yes   Plan:  - Warfarin 5mg  PO x 1 tonight - Daily INR, CBC q72h - Monitor s/sx of bleeding  Levester Fresh, PharmD, BCPS, [facility] Clinical Pharmacist Pager [phone] 09/02/2015 2:00 PM

## 2015-09-02 NOTE — Progress Notes (Signed)
  Echocardiogram 2D Echocardiogram has been performed.  [name] M 09/02/2015, 12:05 PM

## 2015-09-02 NOTE — Discharge Summary (Signed)
Physician Discharge Summary  PACER WEHR [id number] DOB: [birth year] DOA: 09/01/2015  PCP: [name], MD  Admit date: 09/01/2015 Discharge date: 09/02/2015  Admitted From: home  Disposition: Home   Recommendations for Outpatient Follow-up:  1. Follow up with PCP in 1-2 weeks 2. Please obtain BMP/CBC in one week 3. Please follow up with cardiology Dr Burt Knack in one week.   Home Health:No  Discharge Condition stable.  CODE STATUS: full code Diet recommendation: Heart Healthy   Brief/Interim Summary: [name] is a 82 y.o. male with medical history significant for chronic atrial fibrillation on Coumadin, severe aortic stenosis status post TAVR, CAD status post CABG, and chronic systolic CHF who presents to the ED with 2 days of exertional dyspnea, generalized weakness, and nausea.  Discharge Diagnoses:  Principal Problem:   DOE (dyspnea on exertion) Active Problems:   Ischemic cardiomyopathy   GERD (gastroesophageal reflux disease)   Atrial fibrillation, chronic (HCC)   Anticoagulated on Coumadin   Weakness   Hyponatremia   Hypothyroidism   Chronic systolic CHF (congestive heart failure) (HCC)   Exertional dyspnea   Shortness of breath  Dyspnea/ generalized weakness: unclear etiology, probably from volume depletion, hyponatremia and hyperthyroidism.  He was admitted for evaluation of ACS.  Serial Troponins  Are negative.  Echocardiogram is unchanged. Recommend to follow up with cardiology.  He ws given fluid bolus in ED and patient reports he felt much better after that.  tsh is low and free t4 is high, hence decreased the dose from 150 to 125 mcg.  Recommend rechecking free t4 and TSh in 4 weeks at PCP office.    Chronic systolic heart failure: compensated failure.   Hyponatremia: Improved with gentle hydraton.     Discharge Instructions  Discharge Instructions    Diet - low sodium heart healthy    Complete by:  As directed      Discharge instructions     Complete by:  As directed   Follow up with PCP in 2 weeks and recheck your TSH and free t4 in 4 weeks.  We decreased your dose of synthroid from 150 mcg to 173mcg daily.  Please follow up with cardiology in one week.            Medication List    TAKE these medications        acetaminophen 500 MG tablet  Commonly known as:  TYLENOL  Take 1,000 mg by mouth every 6 (six) hours as needed.     alfuzosin 10 MG 24 hr tablet  Commonly known as:  UROXATRAL  Take 10 mg by mouth at bedtime.     aspirin 81 MG tablet  Take 81 mg by mouth daily.     atorvastatin 10 MG tablet  Commonly known as:  LIPITOR  TAKE ONE TABLET EVERY OTHER DAY.     calcium carbonate 600 MG Tabs tablet  Commonly known as:  OS-CAL  Take 600 mg by mouth 2 (two) times daily with a meal. 600 +D     carvedilol 6.25 MG tablet  Commonly known as:  COREG  Take 1 tablet (6.25 mg total) by mouth 2 (two) times daily with a meal.     cholecalciferol 1000 units tablet  Commonly known as:  VITAMIN D  Take 1,000 Units by mouth 2 (two) times daily.     COUMADIN 5 MG tablet  Generic drug:  warfarin  TAKE AS DIRECTED BY COUMADIN CLINIC.     furosemide 20 MG tablet  Commonly known as:  LASIX  Take 1 tablet (20 mg total) by mouth daily as needed (for swelling).     HYDROcodone-acetaminophen 5-325 MG tablet  Commonly known as:  NORCO/VICODIN  Take 1 tablet by mouth every 6 (six) hours as needed for moderate pain.     ipratropium 0.03 % nasal spray  Commonly known as:  ATROVENT  Place 2 sprays into both nostrils 2 (two) times daily.     levothyroxine 125 MCG tablet  Commonly known as:  SYNTHROID  Take 1 tablet (125 mcg total) by mouth daily before breakfast.     NITROSTAT 0.4 MG SL tablet  Generic drug:  nitroGLYCERIN  DISSOLVE 1 TABLET UNDER TONGUE AS NEEDED FOR CHEST PAIN,MAY REPEAT IN5 MINUTES FOR 2 DOSES.     omeprazole 20 MG capsule  Commonly known as:  PRILOSEC  Take 20 mg by mouth daily as needed (acid  reflux).     polyethylene glycol packet  Commonly known as:  MIRALAX / GLYCOLAX  Take 17 g by mouth daily as needed for mild constipation.     PROBIOTIC DAILY PO  Take 1 capsule by mouth daily.     RESTASIS 0.05 % ophthalmic emulsion  Generic drug:  cycloSPORINE  Place 1 drop into both eyes every 12 (twelve) hours.     SYMBICORT IN  Inhale 1 puff into the lungs 2 (two) times daily.           Follow-up Information    Follow up with [name], MD In 2 weeks.   Specialty:  Internal Medicine   Why:  please check your thyroid panel in 4 weeks .    Contact information:   [address] [place] [address] [phone]       Follow up with Sherren Mocha, MD. Schedule an appointment as soon as possible for a visit in 1 week.   Specialty:  Cardiology   Contact information:   [id number] [place] [address] [phone]      Allergies  Allergen Reactions  . Penicillins Shortness Of Breath and Rash    Has patient had a PCN reaction causing immediate rash, facial/tongue/throat swelling, SOB or lightheadedness with hypotension: Yes Has patient had a PCN reaction causing severe rash involving mucus membranes or skin necrosis: No Has patient had a PCN reaction that required hospitalization No Has patient had a PCN reaction occurring within the last 10 years: No If all of the above answers are "NO", then may proceed with Cephalosporin use.   . Tizanidine Shortness Of Breath    Light headed  . Ace Inhibitors     Has not tolerated in the past due to hyperkalemia  . Antihistamines, Diphenhydramine-Type Other (See Comments)    Inhibits urination  . Amiodarone Other (See Comments)    Unknown     Consultations:  None.    Procedures/Studies: Dg Chest 2 View  09/01/2015  CLINICAL DATA:  Pt reports weakness and fatigue onset yesterday; he denies CP or SOB; he reports feeling like he did just before having aortic valve replacement 5 years ago; h/o  ICD insertion, AFIB, and CHF also; former smoker EXAM: CHEST  2 VIEW COMPARISON:  07/11/2015 FINDINGS: Prior median sternotomy. Artifact degradation posteriorly on the lateral view. There is an incompletely imaged upper lumbar vertebral compression deformity. Pacer/AICD device. Midline trachea. Mild cardiomegaly. Atherosclerosis in the transverse aorta. No pleural effusion or pneumothorax. Chronic mild pulmonary interstitial thickening. No overt congestive failure. No lobar consolidation. Aortic  valve repair. IMPRESSION: No acute cardiopulmonary disease. Cardiomegaly without congestive failure. Aortic atherosclerosis. Electronically Signed   By: Abigail Miyamoto M.D.   On: 09/01/2015 14:52       Subjective:  Feels much better soon after he was given fluids in ED. Denies any chest pain or sob or palpitations.  Discharge Exam: Filed Vitals:   09/02/15 0954 09/02/15 1302  BP:  103/56  Pulse: 75 61  Temp:  97.7 F (36.5 C)  Resp: 16 18   Filed Vitals:   09/02/15 0028 09/02/15 0401 09/02/15 0954 09/02/15 1302  BP: 125/59 108/62  103/56  Pulse: 61 58 75 61  Temp: 97.6 F (36.4 C) 98.7 F (37.1 C)  97.7 F (36.5 C)  TempSrc: Oral Oral  Oral  Resp: 18 18 16 18   Height:      Weight:  83.87 kg (184 lb 14.4 oz)    SpO2: 98% 98% 95% 99%    General: Pt is alert, awake, not in acute distress Cardiovascular: RRR, S1/S2 +, no rubs, no gallops Respiratory: CTA bilaterally, no wheezing, no rhonchi Abdominal: Soft, NT, ND, bowel sounds + Extremities: no edema, no cyanosis    The results of significant diagnostics from this hospitalization (including imaging, microbiology, ancillary and laboratory) are listed below for reference.     Microbiology: No results found for this or any previous visit (from the past 240 hour(s)).   Labs: BNP (last 3 results)  Recent Labs  03/28/15 0823 07/19/15 1116 09/01/15 1713  BNP 389.9* 204.4* [id number]   Basic Metabolic Panel:  Recent Labs Lab  09/01/15 1420 09/02/15 0214 09/02/15 1514  NA 129* 128* 131*  K 4.0 3.9 4.5  CL 94* 96* 96*  CO2 27 26 30   GLUCOSE 89 105* 89  BUN 13 15 15   CREATININE 1.10 1.10 1.18  CALCIUM 8.6* 8.1* 8.6*   Liver Function Tests: No results for input(s): AST, ALT, ALKPHOS, BILITOT, PROT, ALBUMIN in the last 168 hours. No results for input(s): LIPASE, AMYLASE in the last 168 hours. No results for input(s): AMMONIA in the last 168 hours. CBC:  Recent Labs Lab 09/01/15 1420  WBC 5.5  HGB 13.7  HCT 42.4  MCV 91.2  PLT 167   Cardiac Enzymes:  Recent Labs Lab 09/01/15 1734 09/01/15 2010 09/02/15 0237 09/02/15 0820  TROPONINI 0.04* 0.04* 0.03 0.03   BNP: Invalid input(s): POCBNP CBG: No results for input(s): GLUCAP in the last 168 hours. D-Dimer No results for input(s): DDIMER in the last 72 hours. Hgb A1c No results for input(s): HGBA1C in the last 72 hours. Lipid Profile No results for input(s): CHOL, HDL, LDLCALC, TRIG, CHOLHDL, LDLDIRECT in the last 72 hours. Thyroid function studies  Recent Labs  09/01/15 2010  TSH 0.170*   Anemia work up No results for input(s): VITAMINB12, FOLATE, FERRITIN, TIBC, IRON, RETICCTPCT in the last 72 hours. Urinalysis    Component Value Date/Time   COLORURINE YELLOW 09/01/2015 1751   APPEARANCEUR CLEAR 09/01/2015 1751   LABSPEC 1.013 09/01/2015 1751   LABSPEC 1.010 11/16/2006 1032   PHURINE 7.0 09/01/2015 1751   PHURINE 6.0 11/16/2006 1032   GLUCOSEU NEGATIVE 09/01/2015 1751   HGBUR NEGATIVE 09/01/2015 1751   HGBUR Large 11/16/2006 [place] 09/01/2015 1751   BILIRUBINUR Negative 11/16/2006 [place] 09/01/2015 1751   KETONESUR Negative 11/16/2006 [place] 09/01/2015 1751   PROTEINUR Negative 11/16/2006 1032   UROBILINOGEN 0.2 11/25/2014 0510   NITRITE NEGATIVE 09/01/2015  1751   NITRITE Negative 11/16/2006 [place] 09/01/2015 1751   LEUKOCYTESUR Trace  11/16/2006 1032   Sepsis Labs Invalid input(s): PROCALCITONIN,  WBC,  LACTICIDVEN Microbiology No results found for this or any previous visit (from the past 240 hour(s)).   Time coordinating discharge: Over 30 minutes  SIGNED:   Hosie Poisson, MD  [facility] 09/02/2015, 5:37 PM Pager [phone]  If 7PM-7AM, please contact night-coverage [email or web address] Password [email or web address]

## 2015-09-07 ENCOUNTER — Telehealth: Payer: Self-pay | Admitting: *Deleted

## 2015-09-07 ENCOUNTER — Ambulatory Visit (INDEPENDENT_AMBULATORY_CARE_PROVIDER_SITE_OTHER): Payer: Medicare Other | Admitting: Nurse Practitioner

## 2015-09-07 ENCOUNTER — Ambulatory Visit (INDEPENDENT_AMBULATORY_CARE_PROVIDER_SITE_OTHER): Payer: Medicare Other

## 2015-09-07 ENCOUNTER — Ambulatory Visit (INDEPENDENT_AMBULATORY_CARE_PROVIDER_SITE_OTHER): Payer: Medicare Other | Admitting: Cardiovascular Disease

## 2015-09-07 ENCOUNTER — Encounter: Payer: Self-pay | Admitting: Nurse Practitioner

## 2015-09-07 VITALS — BP 110/68 | HR 56 | Ht 75.0 in | Wt 186.1 lb

## 2015-09-07 DIAGNOSIS — I5022 Chronic systolic (congestive) heart failure: Secondary | ICD-10-CM

## 2015-09-07 DIAGNOSIS — I482 Chronic atrial fibrillation, unspecified: Secondary | ICD-10-CM

## 2015-09-07 DIAGNOSIS — Z5181 Encounter for therapeutic drug level monitoring: Secondary | ICD-10-CM

## 2015-09-07 DIAGNOSIS — Z95810 Presence of automatic (implantable) cardiac defibrillator: Secondary | ICD-10-CM

## 2015-09-07 LAB — POCT INR: INR: 3.5

## 2015-09-07 LAB — BASIC METABOLIC PANEL
BUN: 11 mg/dL (ref 7–25)
CO2: 26 mmol/L (ref 20–31)
Calcium: 9.3 mg/dL (ref 8.6–10.3)
Chloride: 95 mmol/L — ABNORMAL LOW (ref 98–110)
Creat: 0.99 mg/dL (ref 0.70–1.11)
Glucose, Bld: 92 mg/dL (ref 65–99)
Potassium: 4.8 mmol/L (ref 3.5–5.3)
Sodium: 131 mmol/L — ABNORMAL LOW (ref 135–146)

## 2015-09-07 NOTE — Progress Notes (Signed)
EPIC Encounter for ICM Monitoring  Patient Name: [name] is a 82 y.o. male Date: 09/07/2015 Primary Care Physican: [name], MD Primary Cardiologist: Burt Knack Electrophysiologist: Caryl Comes Dry Weight: 176 lbs        In the past month, have you:  1. Gained more than 2 pounds in a day or more than 5 pounds in a week? No  2. Had changes in your medications (with verification of current medications)? No  3. Had more shortness of breath than is usual for you? No   4. Limited your activity because of shortness of breath? No   5. Not been able to sleep because of shortness of breath? No   6. Had increased swelling in your feet, ankles, legs or stomach area? No   7. Had symptoms of dehydration (dizziness, dry mouth, increased thirst, decreased urine output) No   8. Had changes in sodium restriction? No   9. Been compliant with medication? Yes   ICM trend: 3 month view for 09/07/2015   ICM trend: 1 year view for 09/07/2015    Follow-up plan: ICM clinic phone appointment 10/08/2015.    FLUID LEVELS: Optivol thoracic impedance decreased 08/04/2015 to 08/29/2015 suggesting fluid accumulation and returned to baseline 08/30/2015 suggesting stabilized fluid levels.    SYMPTOMS:  None.  He reported having pneumonia but after 4 courses of antibiotics he is finally feeling better.  He had office visit with Truitt Merle, PA.  Encouraged to call for any fluid symptoms.   RECOMMENDATIONS: No changes today.     Rosalene Billings, RN, CCM 09/07/2015 3:19 PM

## 2015-09-07 NOTE — Telephone Encounter (Signed)
S/w pt per provider's request pt's appointment today will be at 11:45 am.

## 2015-09-07 NOTE — Progress Notes (Signed)
CARDIOLOGY OFFICE NOTE  Date:  09/07/2015    [name] Date of Birth: 06-Oct-1932 Medical Record [id number]  PCP:  [name], MD  Cardiologist:  [place]    Chief Complaint  Patient presents with  . Fatigue    Work in [place] - seen for Dr. Burt Knack and Dr. Caryl Comes    History of Present Illness: [name] is a 82 y.o. male who presents today for a work in visit. Seen for Dr. Burt Knack and Dr. Caryl Comes. He previously was followed by Dr. Doreatha Lew.   He has a very complex past medical history.  He has a history of congestive heart failure/ischemic cardiomyopathy, CAD and aortic valve disease. His CAD was initially treated with CABG surgery in 1979. He then had redo CABG in 1991. He's had severe ischemic cardiomyopathy with left ventricular EF as low as 25%. He has an ICD which is followed by Dr. Caryl Comes. He also underwent transcatheter aortic valve replacement the [facility] in 2012 for treatment of severe symptomatic aortic stenosis. He has had moderate perivalvular aortic insufficiency. He also has a history of permanent atrial fibrillation (previously intolerant to amiodarone) and is maintained on long-term anticoagulation therapy with Coumadin. Also with prior h/o PE.  Last seen here back in May.   Walked in here to the office last week - generalized weakness - did not have an appointment - referred on to the ER. Got an echo - EF still 20 to 25%. Sodium a little low. Referred back here for me to see. I have not formally seen him since January of 2013.   Comes back today. Here with his wife, [name] - I see her as well - actually saw her last week - she has not done well with his chronic illness. He says he feels better today. Not really clear as to why he feels better but does not seem as weak. His dose of thyroid medicine was cut back - he is asking about when to recheck. Seeing Dr. Virgina Jock later this week. No chest pain. Says his breathing is ok. Weight is  stable. Not dizzy or lightheaded. No syncope.  Pretty inactive but trying to walk some but does not get out of the house much. Very dependent on his wife.  Complains that his voice is still hoarse - thinking about going back to ENT. He is not on ACE. Actually on very little CHF regimen.   Past Medical History  Diagnosis Date  . Ischemic cardiomyopathy 05/2010    Has EF of 25%  . Diverticulitis     CURRENTLY CONTROLLED WITH NO EVIDENCE OF RECURRENT INFECTION  . AF (atrial fibrillation) ([place])     Has not tolerated amiodarone in the past. Amiodarone was stopped in September of 2010 due to side effects  . Chronic anticoagulation     on coumadin  . Prostate cancer ([place])   . Osteoarthritis     RIGHT KNEE  . Aortic stenosis, severe     WITH PERCUTANEOUS AORTIC VALVE (TAVI) IN March 2012 at the [facility]  . MI (myocardial infarction) ([employer]) 1974, 1978  . Cervical myelopathy ([place])   . Pulmonary embolism ([place])   . Hyperlipidemia   . GERD (gastroesophageal reflux disease)   . Esophageal stricture     WITH DILATATION  . NSVT (nonsustained ventricular tachycardia) ([place])   . Coronary artery disease   . Heart murmur   . Osteoporosis   . Stroke [facility])  3 strokes  . CHF (congestive heart failure) [facility])     Past Surgical History  Procedure Laterality Date  . Icd  Feb 2003; 02/2013    gen change 02-11-2013 by Dr Caryl Comes  . Coronary artery bypass graft  1979  . Coronary artery bypass graft  1991    REDO SURGERY  . Cardiac catheterization  2010    SEVERE LV DYSFUNCTION WITH ESTIMATED EJECTION FRACTION OF 25%  . Cholecystectomy    . Aortic valve replacement      Percutaneous AVR in March 2012 at the [facility]  . Cardioversion  02/16/2011    Procedure: CARDIOVERSION;  Surgeon: Carlena Bjornstad, MD;  Location: [place];  Service: Cardiovascular;  Laterality: N/A;  . Shoulder surgery    . Back surgery    . Implantable cardioverter defibrillator (icd) generator change N/A 02/10/2013     Procedure: ICD GENERATOR CHANGE;  Surgeon: Deboraha Sprang, MD;  Location: [facility] CATH LAB;  Service: Cardiovascular;  Laterality: N/A;     Medications: Current Outpatient Prescriptions  Medication Sig Dispense Refill  . acetaminophen (TYLENOL) 500 MG tablet Take 1,000 mg by mouth every 6 (six) hours as needed.    Marland Kitchen alfuzosin (UROXATRAL) 10 MG 24 hr tablet Take 10 mg by mouth at bedtime.     Marland Kitchen aspirin 81 MG tablet Take 81 mg by mouth daily.    Marland Kitchen atorvastatin (LIPITOR) 10 MG tablet TAKE ONE TABLET EVERY OTHER DAY. 45 tablet 2  . Budesonide-Formoterol Fumarate (SYMBICORT IN) Inhale 1 puff into the lungs 2 (two) times daily.    . calcium carbonate (OS-CAL) 600 MG TABS tablet Take 600 mg by mouth 2 (two) times daily with a meal. 600 +D    . carvedilol (COREG) 6.25 MG tablet Take 1 tablet (6.25 mg total) by mouth 2 (two) times daily with a meal. 60 tablet 11  . cholecalciferol (VITAMIN D) 1000 UNITS tablet Take 1,000 Units by mouth 2 (two) times daily.    Marland Kitchen COUMADIN 5 MG tablet TAKE AS DIRECTED BY COUMADIN CLINIC. (Patient taking differently: Take 5 mg daily) 90 tablet 1  . furosemide (LASIX) 20 MG tablet Take 1 tablet (20 mg total) by mouth daily as needed (for swelling). 30 tablet 3  . HYDROcodone-acetaminophen (NORCO/VICODIN) 5-325 MG per tablet Take 1 tablet by mouth every 6 (six) hours as needed for moderate pain. 30 tablet 0  . ipratropium (ATROVENT) 0.03 % nasal spray Place 2 sprays into both nostrils 2 (two) times daily.    Marland Kitchen levothyroxine (SYNTHROID) 125 MCG tablet Take 1 tablet (125 mcg total) by mouth daily before breakfast. 30 tablet 0  . NITROSTAT 0.4 MG SL tablet DISSOLVE 1 TABLET UNDER TONGUE AS NEEDED FOR CHEST PAIN,MAY REPEAT IN5 MINUTES FOR 2 DOSES. 25 tablet 10  . omeprazole (PRILOSEC) 20 MG capsule Take 20 mg by mouth daily as needed (acid reflux).    . polyethylene glycol (MIRALAX / GLYCOLAX) packet Take 17 g by mouth daily as needed for mild constipation.     . Probiotic Product  (PROBIOTIC DAILY PO) Take 1 capsule by mouth daily.    . RESTASIS 0.05 % ophthalmic emulsion Place 1 drop into both eyes every 12 (twelve) hours.      No current facility-administered medications for this visit.    Allergies: Allergies  Allergen Reactions  . Penicillins Shortness Of Breath and Rash    Has patient had a PCN reaction causing immediate rash, facial/tongue/throat swelling, SOB or lightheadedness with hypotension: Yes  Has patient had a PCN reaction causing severe rash involving mucus membranes or skin necrosis: No Has patient had a PCN reaction that required hospitalization No Has patient had a PCN reaction occurring within the last 10 years: No If all of the above answers are "NO", then may proceed with Cephalosporin use.   . Tizanidine Shortness Of Breath    Light headed  . Ace Inhibitors     Has not tolerated in the past due to hyperkalemia  . Antihistamines, Diphenhydramine-Type Other (See Comments)    Inhibits urination  . Amiodarone Other (See Comments)    Unknown     Social History: The patient  reports that he quit smoking about 46 years ago. His smoking use included Cigarettes. He has a 60 pack-year smoking history. He has never used smokeless tobacco. He reports that he does not drink alcohol or use illicit drugs.   Family History: The patient's family history includes Diabetes (age of onset: 64) in his mother; Heart disease (age of onset: 69) in his mother; Heart disease (age of onset: 36) in his father.   Review of Systems: Please see the history of present illness.   Otherwise, the review of systems is positive for none.   All other systems are reviewed and negative.   Physical Exam: VS:  BP 110/68 mmHg  Pulse 56  Ht 6\' 3"  (1.905 m)  Wt 186 lb 1.9 oz (84.423 kg)  BMI 23.26 kg/m2 .  BMI Body mass index is 23.26 kg/(m^2).  Wt Readings from Last 3 Encounters:  09/07/15 186 lb 1.9 oz (84.423 kg)  09/02/15 184 lb 14.4 oz (83.87 kg)  07/19/15 186 lb  12.8 oz (84.732 kg)    General: Elderly male. Looks chronically ill but alert and no acute distress.  HEENT: Normal. Neck: Supple, no JVD, carotid bruits, or masses noted.  Cardiac: Irregular irregular rhythm. Rate ok. Soft outflow murmur noted. No edema.  Respiratory:  Lungs are fairly clear to auscultation bilaterally with normal work of breathing.  GI: Soft and nontender.  MS: No deformity or atrophy. Gait and ROM intact. He is using a walker.  Skin: Warm and dry. Color is normal.  Neuro:  Strength and sensation are intact and no gross focal deficits noted.  Psych: Alert, appropriate and with normal affect.   LABORATORY DATA:  EKG:  EKG is not ordered today.  Lab Results  Component Value Date   WBC 5.5 09/01/2015   HGB 13.7 09/01/2015   HCT 42.4 09/01/2015   PLT 167 09/01/2015   GLUCOSE 89 09/02/2015   CHOL 157 10/06/2014   TRIG 76.0 10/06/2014   HDL 49.50 10/06/2014   LDLCALC 92 10/06/2014   ALT 15* 07/11/2015   AST 21 07/11/2015   NA 131* 09/02/2015   K 4.5 09/02/2015   CL 96* 09/02/2015   CREATININE 1.18 09/02/2015   BUN 15 09/02/2015   CO2 30 09/02/2015   TSH 0.170* 09/01/2015   PSA 0.62 08/12/2008   INR 2.52* 09/02/2015   HGBA1C 5.9* 12/03/2011    BNP (last 3 results)  Recent Labs  03/28/15 0823 07/19/15 1116 09/01/15 1713  BNP 389.9* 204.4* 213.2*    ProBNP (last 3 results) No results for input(s): PROBNP in the last 8760 hours.   Other Studies Reviewed Today:  Cardiac Studies Reviewed:  Echo Study Conclusions from 08/2015  - Left ventricle: Diffuse hypokinesis septal and apical akinesis.  The cavity size was severely dilated. Wall thickness was  increased in a pattern of  mild LVH. Systolic function was  severely reduced. The estimated ejection fraction was in the  range of 20% to 25%. - Aortic valve: Post TAVR with normal systolic gradients and mild  perivalvular regurgitation. - Left atrium: The atrium was moderately dilated. -  Atrial septum: No defect or patent foramen ovale was identified.   2D Echo 03/16/2015: Study Conclusions  - Left ventricle: Inferior , septal and apical akinesis. The cavity size was severely dilated. Wall thickness was normal. Systolic function was severely reduced. The estimated ejection fraction was in the range of 20% to 25%. - Aortic valve: Post TAVR. Persistant moderate perivalvular regurgitation. - Mitral valve: Calcified annulus. - Left atrium: The atrium was moderately dilated. - Atrial septum: No defect or patent foramen ovale was identified. - Pulmonary arteries: PA peak pressure: 33 mm Hg (S).  ASSESSMENT AND PLAN: 1. Chronic systolic heart failure: NYHA II/III. Pt unable to tolerate an aggressive CHF regimen because of hypotension, generalized weakness. Will continue his current Rx. Echo reviewed and shows severe LV dysfunction which is a chronic finding.   2. Aortic valve disease s/p TAVR: moderate paravalvular AI unchanged from previous echo studies. LV dimensions stable as well.   3. Chronic atrial fibrillation: maintained on warfarin, heart rate controlled.   4. CAD s/p CABG: no angina reported.   5. Hyperlipidemia: treated with atorvastatin.   6. Generalized weakness - which I suspect is multifactorial. Tried to be encouraging about the need for regular activity. He realizes that this is taking a toll on Jo as well and has agreed to start looking into getting some help in the home.    Current medicines are reviewed with the patient today.  The patient does not have concerns regarding medicines other than what has been noted above.  The following changes have been made:  See above.  Labs/ tests ordered today include:    Orders Placed This Encounter  Procedures  . Brain natriuretic peptide  . Basic metabolic panel     Disposition:   FU with Dr. Burt Knack in August - I will be happy to co-manage after that visit with Dr. Burt Knack.   Patient is agreeable  to this plan and will call if any problems develop in the interim.   Signed: Burtis Junes, RN, ANP-C 09/07/2015 12:53 PM  [facility] [address] [place], [place]  [address] Phone: [phone] Fax: [phone]

## 2015-09-07 NOTE — Patient Instructions (Addendum)
We will be checking the following labs today - BNP and BMET   Medication Instructions:    Continue with your current medicines.     Testing/Procedures To Be Arranged:  N/A  Follow-Up:   See Dr. Burt Knack in August as planned.     Other Special Instructions:   Think about what we talked about today    If you need a refill on your cardiac medications before your next appointment, please call your pharmacy.   Call the [place] office at [phone] if you have any questions, problems or concerns.

## 2015-09-08 ENCOUNTER — Other Ambulatory Visit (HOSPITAL_COMMUNITY): Payer: Self-pay | Admitting: *Deleted

## 2015-09-08 LAB — BRAIN NATRIURETIC PEPTIDE: Brain Natriuretic Peptide: 217.7 pg/mL — ABNORMAL HIGH (ref <100)

## 2015-09-09 ENCOUNTER — Ambulatory Visit (HOSPITAL_COMMUNITY)
Admission: RE | Admit: 2015-09-09 | Discharge: 2015-09-09 | Disposition: A | Discharge status: Home / Self Care | Payer: Medicare Other | Source: Ambulatory Visit | Attending: Sports Medicine | Admitting: Sports Medicine

## 2015-09-09 DIAGNOSIS — M810 Age-related osteoporosis without current pathological fracture: Secondary | ICD-10-CM | POA: Insufficient documentation

## 2015-09-09 MED ORDER — ZOLEDRONIC ACID 5 MG/100ML IV SOLN
5.0000 mg | Freq: Once | INTRAVENOUS | Status: AC
  Administered 2015-09-09: 5 mg via INTRAVENOUS

## 2015-09-09 MED ORDER — ZOLEDRONIC ACID 5 MG/100ML IV SOLN
INTRAVENOUS | Status: AC
  Administered 2015-09-09: 5 mg via INTRAVENOUS
  Filled 2015-09-09: qty 100

## 2015-09-10 DIAGNOSIS — E038 Other specified hypothyroidism: Secondary | ICD-10-CM | POA: Diagnosis not present

## 2015-09-10 DIAGNOSIS — I5022 Chronic systolic (congestive) heart failure: Secondary | ICD-10-CM | POA: Diagnosis not present

## 2015-09-13 ENCOUNTER — Ambulatory Visit (INDEPENDENT_AMBULATORY_CARE_PROVIDER_SITE_OTHER): Payer: Medicare Other | Admitting: Internal Medicine

## 2015-09-13 DIAGNOSIS — Z5181 Encounter for therapeutic drug level monitoring: Secondary | ICD-10-CM

## 2015-09-13 DIAGNOSIS — I482 Chronic atrial fibrillation, unspecified: Secondary | ICD-10-CM

## 2015-09-13 LAB — POCT INR: INR: 3.5

## 2015-09-20 ENCOUNTER — Ambulatory Visit (INDEPENDENT_AMBULATORY_CARE_PROVIDER_SITE_OTHER): Payer: Medicare Other

## 2015-09-20 DIAGNOSIS — I482 Chronic atrial fibrillation, unspecified: Secondary | ICD-10-CM

## 2015-09-20 DIAGNOSIS — Z5181 Encounter for therapeutic drug level monitoring: Secondary | ICD-10-CM

## 2015-09-20 LAB — POCT INR: INR: 2.9

## 2015-09-23 DIAGNOSIS — L290 Pruritus ani: Secondary | ICD-10-CM | POA: Diagnosis not present

## 2015-09-23 DIAGNOSIS — K644 Residual hemorrhoidal skin tags: Secondary | ICD-10-CM | POA: Diagnosis not present

## 2015-09-24 DIAGNOSIS — L821 Other seborrheic keratosis: Secondary | ICD-10-CM | POA: Diagnosis not present

## 2015-09-24 DIAGNOSIS — D225 Melanocytic nevi of trunk: Secondary | ICD-10-CM | POA: Diagnosis not present

## 2015-09-24 DIAGNOSIS — D1801 Hemangioma of skin and subcutaneous tissue: Secondary | ICD-10-CM | POA: Diagnosis not present

## 2015-09-24 DIAGNOSIS — Z85828 Personal history of other malignant neoplasm of skin: Secondary | ICD-10-CM | POA: Diagnosis not present

## 2015-09-24 DIAGNOSIS — L570 Actinic keratosis: Secondary | ICD-10-CM | POA: Diagnosis not present

## 2015-09-27 ENCOUNTER — Ambulatory Visit (INDEPENDENT_AMBULATORY_CARE_PROVIDER_SITE_OTHER): Payer: Medicare Other | Admitting: Internal Medicine

## 2015-09-27 DIAGNOSIS — I482 Chronic atrial fibrillation, unspecified: Secondary | ICD-10-CM

## 2015-09-27 DIAGNOSIS — Z7901 Long term (current) use of anticoagulants: Secondary | ICD-10-CM | POA: Diagnosis not present

## 2015-09-27 DIAGNOSIS — Z5181 Encounter for therapeutic drug level monitoring: Secondary | ICD-10-CM

## 2015-09-27 LAB — POCT INR: INR: 2.7

## 2015-10-02 DIAGNOSIS — I5022 Chronic systolic (congestive) heart failure: Secondary | ICD-10-CM | POA: Diagnosis not present

## 2015-10-02 DIAGNOSIS — E038 Other specified hypothyroidism: Secondary | ICD-10-CM | POA: Diagnosis not present

## 2015-10-02 DIAGNOSIS — Z6823 Body mass index (BMI) 23.0-23.9, adult: Secondary | ICD-10-CM | POA: Diagnosis not present

## 2015-10-02 DIAGNOSIS — I482 Chronic atrial fibrillation: Secondary | ICD-10-CM | POA: Diagnosis not present

## 2015-10-02 DIAGNOSIS — E871 Hypo-osmolality and hyponatremia: Secondary | ICD-10-CM | POA: Diagnosis not present

## 2015-10-02 DIAGNOSIS — R5383 Other fatigue: Secondary | ICD-10-CM | POA: Diagnosis not present

## 2015-10-08 ENCOUNTER — Ambulatory Visit (INDEPENDENT_AMBULATORY_CARE_PROVIDER_SITE_OTHER): Payer: Medicare Other | Admitting: *Deleted

## 2015-10-08 DIAGNOSIS — Z95810 Presence of automatic (implantable) cardiac defibrillator: Secondary | ICD-10-CM

## 2015-10-08 DIAGNOSIS — I255 Ischemic cardiomyopathy: Secondary | ICD-10-CM

## 2015-10-08 DIAGNOSIS — I5022 Chronic systolic (congestive) heart failure: Secondary | ICD-10-CM | POA: Diagnosis not present

## 2015-10-08 NOTE — Progress Notes (Signed)
Remote ICD transmission.   

## 2015-10-08 NOTE — Progress Notes (Signed)
EPIC Encounter for ICM Monitoring  Patient Name: [name] is a 82 y.o. male Date: 10/08/2015 Primary Care Physican: [name], MD Primary Cardiologist: Burt Knack Electrophysiologist: Caryl Comes Dry Weight: unknown      Heart Failure questions reviewed, pt asymptomatic   Thoracic impedance decreased suggesting fluid accumulation since 09/17/2015.  Recommendations:  Take Furosemide 20 mg x 2 days and then return to prn dosage.    ICM trend: 10/08/2015    Follow-up plan: ICM clinic phone appointment on 10/14/2015.  Copy of ICM check sent to primary cardiologist and device physician.   Rosalene Billings, RN 10/08/2015 3:26 PM

## 2015-10-11 ENCOUNTER — Ambulatory Visit (INDEPENDENT_AMBULATORY_CARE_PROVIDER_SITE_OTHER): Payer: Medicare Other | Admitting: Internal Medicine

## 2015-10-11 ENCOUNTER — Encounter: Payer: Self-pay | Admitting: Cardiology

## 2015-10-11 DIAGNOSIS — Z5181 Encounter for therapeutic drug level monitoring: Secondary | ICD-10-CM

## 2015-10-11 DIAGNOSIS — I482 Chronic atrial fibrillation, unspecified: Secondary | ICD-10-CM

## 2015-10-11 LAB — POCT INR: INR: 2.5

## 2015-10-14 ENCOUNTER — Ambulatory Visit (INDEPENDENT_AMBULATORY_CARE_PROVIDER_SITE_OTHER): Payer: Medicare Other

## 2015-10-14 DIAGNOSIS — I5022 Chronic systolic (congestive) heart failure: Secondary | ICD-10-CM

## 2015-10-14 DIAGNOSIS — Z95810 Presence of automatic (implantable) cardiac defibrillator: Secondary | ICD-10-CM

## 2015-10-15 ENCOUNTER — Telehealth: Payer: Self-pay | Admitting: Cardiovascular Disease

## 2015-10-15 LAB — CUP PACEART REMOTE DEVICE CHECK
Battery Remaining Longevity: 119 mo
Battery Voltage: 2.96 V
Brady Statistic RV Percent Paced: 3.23 %
Date Time Interrogation Session: 20170728122936
HIGH POWER IMPEDANCE MEASURED VALUE: 41 Ohm
HighPow Impedance: 52 Ohm
Implantable Lead Model: 6944
Lead Channel Impedance Value: 418 Ohm
Lead Channel Sensing Intrinsic Amplitude: 17.75 mV
Lead Channel Setting Sensing Sensitivity: 0.3 mV
MDC IDC LEAD IMPLANT DT: 20021220
MDC IDC LEAD LOCATION: 753860
MDC IDC MSMT LEADCHNL RV IMPEDANCE VALUE: 551 Ohm
MDC IDC MSMT LEADCHNL RV PACING THRESHOLD AMPLITUDE: 1 V
MDC IDC MSMT LEADCHNL RV PACING THRESHOLD PULSEWIDTH: 0.4 ms
MDC IDC MSMT LEADCHNL RV SENSING INTR AMPL: 17.75 mV
MDC IDC SET LEADCHNL RV PACING AMPLITUDE: 2 V
MDC IDC SET LEADCHNL RV PACING PULSEWIDTH: 0.4 ms

## 2015-10-15 NOTE — Telephone Encounter (Signed)
Spoke with patient.

## 2015-10-15 NOTE — Progress Notes (Signed)
EPIC Encounter for ICM Monitoring  Patient Name: [name] is a 82 y.o. male Date: 10/15/2015 Primary Care Physican: [name], MD Primary Cardiologist: Burt Knack Electrophysiologist: Caryl Comes Dry Weight: 181.4 lb         Heart Failure questions reviewed, pt has a little swelling in one ankle  After taking 2 days of Furosemide 20 mg on 10/08/2015, thoracic impedance returned to normal but then decreased again on 10/10/2015 suggesting fluid accumulation.  He reported he eats out most of the time and this weekend he had [employer].   Recommendations: Recommended to repeat Furosemide 20 mg 1 tablet x 2 days and then return to prn as prescribed.  Advised to limit sodium to 2000 mg daily.    ICM trend: 10/14/2015     Follow-up plan: ICM clinic phone appointment on 10/20/2015.  Copy of ICM check sent to primary cardiologist and device physician.   Rosalene Billings, RN 10/15/2015 12:31 PM

## 2015-10-15 NOTE — Telephone Encounter (Signed)
New Message  Pt call requesting to speak with RN about he results of his defib check. Please call back to discuss

## 2015-10-20 ENCOUNTER — Ambulatory Visit (INDEPENDENT_AMBULATORY_CARE_PROVIDER_SITE_OTHER): Payer: Medicare Other

## 2015-10-20 DIAGNOSIS — I5022 Chronic systolic (congestive) heart failure: Secondary | ICD-10-CM

## 2015-10-20 DIAGNOSIS — Z95810 Presence of automatic (implantable) cardiac defibrillator: Secondary | ICD-10-CM

## 2015-10-20 NOTE — Progress Notes (Signed)
EPIC Encounter for ICM Monitoring  Patient Name: [name] is a 82 y.o. male Date: 10/20/2015 Primary Care Physican: [name], MD Primary Cardiologist: Burt Knack Electrophysiologist: Caryl Comes Dry Weight: unknown       Heart Failure questions reviewed, ankle swelling has resolved.  Pt asymptomatic today  Since last ICM remote transmission on 10/14/2015 and taking 1 prn dose of Furosemide, thoracic impedance abnormal, slightly below baseline suggesting fluid accumulation.  Recommendations:   Patient took 1 dose of Furosemide for 1 day instead of 2 days as recommended.  No changes today.  Has appointment with Dr Burt Knack on 11/01/2015.  Will repeat ICM transmission on 10/29/2015  ICM trend: 10-20-2015     Follow-up plan: ICM clinic phone appointment on 10/29/2015.  Copy of ICM check sent to primary cardiologist and device physician.   Rosalene Billings, RN 10/20/2015 3:55 PM

## 2015-10-25 ENCOUNTER — Emergency Department (HOSPITAL_COMMUNITY)
Admission: EM | Admit: 2015-10-25 | Discharge: 2015-10-26 | Disposition: A | Discharge status: Home / Self Care | Payer: Medicare Other | Attending: Dermatology | Admitting: Dermatology

## 2015-10-25 ENCOUNTER — Ambulatory Visit (INDEPENDENT_AMBULATORY_CARE_PROVIDER_SITE_OTHER): Payer: Medicare Other | Admitting: Pharmacist

## 2015-10-25 ENCOUNTER — Encounter (HOSPITAL_COMMUNITY): Payer: Self-pay | Admitting: Emergency Medicine

## 2015-10-25 DIAGNOSIS — Z8673 Personal history of transient ischemic attack (TIA), and cerebral infarction without residual deficits: Secondary | ICD-10-CM | POA: Insufficient documentation

## 2015-10-25 DIAGNOSIS — K625 Hemorrhage of anus and rectum: Secondary | ICD-10-CM | POA: Insufficient documentation

## 2015-10-25 DIAGNOSIS — Z5181 Encounter for therapeutic drug level monitoring: Secondary | ICD-10-CM

## 2015-10-25 DIAGNOSIS — I509 Heart failure, unspecified: Secondary | ICD-10-CM | POA: Diagnosis not present

## 2015-10-25 DIAGNOSIS — Z5321 Procedure and treatment not carried out due to patient leaving prior to being seen by health care provider: Secondary | ICD-10-CM | POA: Insufficient documentation

## 2015-10-25 DIAGNOSIS — Z951 Presence of aortocoronary bypass graft: Secondary | ICD-10-CM | POA: Diagnosis not present

## 2015-10-25 DIAGNOSIS — Z95810 Presence of automatic (implantable) cardiac defibrillator: Secondary | ICD-10-CM | POA: Diagnosis not present

## 2015-10-25 DIAGNOSIS — I252 Old myocardial infarction: Secondary | ICD-10-CM | POA: Insufficient documentation

## 2015-10-25 DIAGNOSIS — I482 Chronic atrial fibrillation, unspecified: Secondary | ICD-10-CM

## 2015-10-25 DIAGNOSIS — I2510 Atherosclerotic heart disease of native coronary artery without angina pectoris: Secondary | ICD-10-CM | POA: Diagnosis not present

## 2015-10-25 DIAGNOSIS — Z87891 Personal history of nicotine dependence: Secondary | ICD-10-CM | POA: Diagnosis not present

## 2015-10-25 DIAGNOSIS — Z8546 Personal history of malignant neoplasm of prostate: Secondary | ICD-10-CM | POA: Diagnosis not present

## 2015-10-25 LAB — CBC
HEMATOCRIT: 41.9 % (ref 39.0–52.0)
Hemoglobin: 13.8 g/dL (ref 13.0–17.0)
MCH: 30.9 pg (ref 26.0–34.0)
MCHC: 32.9 g/dL (ref 30.0–36.0)
MCV: 93.7 fL (ref 78.0–100.0)
PLATELETS: 187 10*3/uL (ref 150–400)
RBC: 4.47 MIL/uL (ref 4.22–5.81)
RDW: 13.4 % (ref 11.5–15.5)
WBC: 6.7 10*3/uL (ref 4.0–10.5)

## 2015-10-25 LAB — COMPREHENSIVE METABOLIC PANEL
ALT: 15 U/L — ABNORMAL LOW (ref 17–63)
AST: 20 U/L (ref 15–41)
Albumin: 3.8 g/dL (ref 3.5–5.0)
Alkaline Phosphatase: 50 U/L (ref 38–126)
Anion gap: 5 (ref 5–15)
BILIRUBIN TOTAL: 1 mg/dL (ref 0.3–1.2)
BUN: 9 mg/dL (ref 6–20)
CO2: 30 mmol/L (ref 22–32)
CREATININE: 0.97 mg/dL (ref 0.61–1.24)
Calcium: 9.6 mg/dL (ref 8.9–10.3)
Chloride: 93 mmol/L — ABNORMAL LOW (ref 101–111)
Glucose, Bld: 82 mg/dL (ref 65–99)
POTASSIUM: 4.1 mmol/L (ref 3.5–5.1)
Sodium: 128 mmol/L — ABNORMAL LOW (ref 135–145)
TOTAL PROTEIN: 6.3 g/dL — AB (ref 6.5–8.1)

## 2015-10-25 LAB — TYPE AND SCREEN
ABO/RH(D): A POS
ANTIBODY SCREEN: NEGATIVE

## 2015-10-25 LAB — POCT INR: INR: 2.3

## 2015-10-25 NOTE — ED Notes (Signed)
Pt no answer when called for room placement x2.

## 2015-10-25 NOTE — ED Triage Notes (Signed)
Had hemorrhoid surgery 2 months ago. At 0800 today thought he had to pass gas and had a large amount of rectal bleeding. Called surgeon and told to do sizt bath. Filled sitz with blood and has continued to have bleeding. On coumadin.

## 2015-10-26 DIAGNOSIS — E038 Other specified hypothyroidism: Secondary | ICD-10-CM | POA: Diagnosis not present

## 2015-10-29 ENCOUNTER — Ambulatory Visit (INDEPENDENT_AMBULATORY_CARE_PROVIDER_SITE_OTHER): Payer: Medicare Other

## 2015-10-29 DIAGNOSIS — Z95810 Presence of automatic (implantable) cardiac defibrillator: Secondary | ICD-10-CM

## 2015-10-29 DIAGNOSIS — Z8546 Personal history of malignant neoplasm of prostate: Secondary | ICD-10-CM | POA: Diagnosis not present

## 2015-10-29 DIAGNOSIS — I5022 Chronic systolic (congestive) heart failure: Secondary | ICD-10-CM

## 2015-10-29 DIAGNOSIS — R350 Frequency of micturition: Secondary | ICD-10-CM | POA: Diagnosis not present

## 2015-10-29 NOTE — Progress Notes (Signed)
EPIC Encounter for ICM Monitoring  Patient Name: [name] is a 82 y.o. male Date: 10/29/2015 Primary Care Physican: [name], MD Primary Cardiologist: Burt Knack Electrophysiologist: Caryl Comes Dry Weight: unknown      Heart Failure questions reviewed, pt asymptomatic   Repeat ICM transmission and thoracic impedance returned to normal 10/23/2015.  Recommendations: No changes.  Low sodium diet education provided.  Patient asked why he has a HF appointment and did not know anything about it until some one called today to tell him it was scheduled due to he was in the ER recently.  He stated he was there due to rectal bleeding from hemorrhoid surgery.  Advised to discuss with Dr Burt Knack at his appointment on 11/01/2015.    Follow-up plan: ICM clinic phone appointment on 11/29/2015.  Office appointment with Dr Burt Knack 11/01/2015.  Copy of ICM check sent to primary cardiologist and device physician.   ICM trend: 10/29/2015       Rosalene Billings, RN 10/29/2015 8:51 AM

## 2015-11-01 ENCOUNTER — Encounter: Payer: Self-pay | Admitting: Cardiovascular Disease

## 2015-11-01 ENCOUNTER — Ambulatory Visit (INDEPENDENT_AMBULATORY_CARE_PROVIDER_SITE_OTHER): Payer: Medicare Other | Admitting: Cardiovascular Disease

## 2015-11-01 VITALS — BP 118/58 | HR 64 | Ht 75.0 in | Wt 191.6 lb

## 2015-11-01 DIAGNOSIS — I5022 Chronic systolic (congestive) heart failure: Secondary | ICD-10-CM | POA: Diagnosis not present

## 2015-11-01 DIAGNOSIS — I482 Chronic atrial fibrillation, unspecified: Secondary | ICD-10-CM

## 2015-11-01 DIAGNOSIS — I359 Nonrheumatic aortic valve disorder, unspecified: Secondary | ICD-10-CM

## 2015-11-01 DIAGNOSIS — E785 Hyperlipidemia, unspecified: Secondary | ICD-10-CM | POA: Diagnosis not present

## 2015-11-01 DIAGNOSIS — I255 Ischemic cardiomyopathy: Secondary | ICD-10-CM | POA: Diagnosis not present

## 2015-11-01 NOTE — Patient Instructions (Signed)
Medication Instructions:  Your physician recommends that you continue on your current medications as directed. Please refer to the Current Medication list given to you today.  Labwork: Your physician recommends that you return for a FASTING LIPID, BMP and BNP in 3 MONTHS--nothing to eat or drink after midnight, lab opens at 7:30 AM.   Testing/Procedures: No new orders.   Follow-Up: Your physician recommends that you schedule a follow-up appointment in: 3 MONTHS with Truitt Merle NP  Your physician wants you to follow-up in: 6 MONTHS with Dr Burt Knack.  You will receive a reminder letter in the mail two months in advance. If you don't receive a letter, please call our office to schedule the follow-up appointment.   Any Other Special Instructions Will Be Listed Below (If Applicable).     If you need a refill on your cardiac medications before your next appointment, please call your pharmacy.

## 2015-11-01 NOTE — Progress Notes (Signed)
Cardiology Office Note Date:  11/03/2015   ID:  [name], DOB 04-Jan-1933, MRN [id number]  PCP:  [name], MD  Cardiologist:  Sherren Mocha, MD    Chief Complaint  Patient presents with  . chronic systolic heart failure     History of Present Illness: [name] is a 82 y.o. male who presents for follow-up evaluation. The patient's followed for chronic systolic heart failure, coronary artery disease, and aortic valve disease. The patient's cardiac history is extensive.  His CAD was initially treated with CABG surgery in 1979. He then had redo CABG in 1991. He's had severe ischemic cardiomyopathy with left ventricular EF as low as 25%. He has an ICD which is followed by Dr. Caryl Comes. He also underwent transcatheter aortic valve replacement the [facility] in 2012 for treatment of severe symptomatic aortic stenosis. He has had moderate perivalvular aortic insufficiency. He also has a history of permanent atrial fibrillation (previously intolerant to amiodarone) and is maintained on long-term anticoagulation therapy with Coumadin. Also with prior h/o PE.  The patient feels like he is doing well. He's been doing more walking recently. He has no dyspnea with exertion. He ambulates with a walker because of leg weakness and gait instability. He denies orthopnea, PND, heart palpitations, lightheadedness, syncope, or edema.  He was in the ER last week for rectal bleeding. Hemoglobin was stable and he left before being seen by the EDP. Details are not really clear. He denies further problem.   Past Medical History:  Diagnosis Date  . AF (atrial fibrillation) ([place])    Has not tolerated amiodarone in the past. Amiodarone was stopped in September of 2010 due to side effects  . Aortic stenosis, severe    WITH PERCUTANEOUS AORTIC VALVE (TAVI) IN March 2012 at the [facility]  . Cervical myelopathy ([place])   . CHF (congestive heart failure) ([place])   . Chronic anticoagulation    on  coumadin  . Coronary artery disease   . Diverticulitis    CURRENTLY CONTROLLED WITH NO EVIDENCE OF RECURRENT INFECTION  . Esophageal stricture    WITH DILATATION  . GERD (gastroesophageal reflux disease)   . Heart murmur   . Hyperlipidemia   . Ischemic cardiomyopathy 05/2010   Has EF of 25%  . MI (myocardial infarction) ([place]) 1974, 1978  . NSVT (nonsustained ventricular tachycardia) ([place])   . Osteoarthritis    RIGHT KNEE  . Osteoporosis   . Prostate cancer ([place])   . Pulmonary embolism ([place])   . Stroke [facility])    3 strokes    Past Surgical History:  Procedure Laterality Date  . AORTIC VALVE REPLACEMENT     Percutaneous AVR in March 2012 at the [facility]  . BACK SURGERY    . CARDIAC CATHETERIZATION  2010   SEVERE LV DYSFUNCTION WITH ESTIMATED EJECTION FRACTION OF 25%  . CARDIOVERSION  02/16/2011   Procedure: CARDIOVERSION;  Surgeon: Carlena Bjornstad, MD;  Location: [place];  Service: Cardiovascular;  Laterality: N/A;  . CHOLECYSTECTOMY    . CORONARY ARTERY BYPASS GRAFT  1979  . CORONARY ARTERY BYPASS GRAFT  1991   REDO SURGERY  . ICD  Feb 2003; 02/2013   gen change 02-11-2013 by Dr Caryl Comes  . IMPLANTABLE CARDIOVERTER DEFIBRILLATOR (ICD) GENERATOR CHANGE N/A 02/10/2013   Procedure: ICD GENERATOR CHANGE;  Surgeon: Deboraha Sprang, MD;  Location: [facility] CATH LAB;  Service: Cardiovascular;  Laterality: N/A;  . SHOULDER SURGERY      Current Outpatient  Prescriptions  Medication Sig Dispense Refill  . acetaminophen (TYLENOL) 500 MG tablet Take 1,000 mg by mouth every 6 (six) hours as needed for mild pain.     Marland Kitchen alfuzosin (UROXATRAL) 10 MG 24 hr tablet Take 10 mg by mouth at bedtime.     Marland Kitchen aspirin 81 MG tablet Take 81 mg by mouth daily.    Marland Kitchen atorvastatin (LIPITOR) 10 MG tablet Take 10 mg by mouth every other day.    . Budesonide-Formoterol Fumarate (SYMBICORT IN) Inhale 1 puff into the lungs 2 (two) times daily.    . calcium carbonate (OS-CAL) 600 MG TABS tablet Take 600 mg by mouth 2  (two) times daily with a meal. 600 +D    . carvedilol (COREG) 6.25 MG tablet Take 1 tablet (6.25 mg total) by mouth 2 (two) times daily with a meal. 60 tablet 11  . cholecalciferol (VITAMIN D) 1000 UNITS tablet Take 1,000 Units by mouth 2 (two) times daily.    Marland Kitchen COUMADIN 5 MG tablet TAKE AS DIRECTED BY COUMADIN CLINIC. (Patient taking differently: Take 5 mg daily) 90 tablet 1  . furosemide (LASIX) 20 MG tablet Take 1 tablet (20 mg total) by mouth daily as needed (for swelling). 30 tablet 3  . HYDROcodone-acetaminophen (NORCO/VICODIN) 5-325 MG per tablet Take 1 tablet by mouth every 6 (six) hours as needed for moderate pain. 30 tablet 0  . ipratropium (ATROVENT) 0.03 % nasal spray Place 2 sprays into both nostrils 2 (two) times daily.    Marland Kitchen levothyroxine (SYNTHROID) 125 MCG tablet Take 1 tablet (125 mcg total) by mouth daily before breakfast. 30 tablet 0  . NITROSTAT 0.4 MG SL tablet DISSOLVE 1 TABLET UNDER TONGUE AS NEEDED FOR CHEST PAIN,MAY REPEAT IN5 MINUTES FOR 2 DOSES. 25 tablet 10  . omeprazole (PRILOSEC) 20 MG capsule Take 20 mg by mouth daily as needed (acid reflux).    . polyethylene glycol (MIRALAX / GLYCOLAX) packet Take 17 g by mouth daily as needed for mild constipation.     . Probiotic Product (PROBIOTIC DAILY PO) Take 1 capsule by mouth daily.    . RESTASIS 0.05 % ophthalmic emulsion Place 1 drop into both eyes every 12 (twelve) hours.      No current facility-administered medications for this visit.     Allergies:   Penicillins; Tizanidine; Ace inhibitors; Antihistamines, diphenhydramine-type; and Amiodarone   Social History:  The patient  reports that he quit smoking about 46 years ago. His smoking use included Cigarettes. He has a 60.00 pack-year smoking history. He has never used smokeless tobacco. He reports that he does not drink alcohol or use drugs.   Family History:  The patient's  family history includes Diabetes (age of onset: 12) in his mother; Heart disease (age of  onset: 38) in his mother; Heart disease (age of onset: 65) in his father.    ROS:  Please see the history of present illness.  Otherwise, review of systems is positive for Cough, easy bruising, constipation, balance problems.  All other systems are reviewed and negative.    PHYSICAL EXAM: VS:  BP (!) 118/58   Pulse 64   Ht 6\' 3"  (1.905 m)   Wt 86.9 kg (191 lb 9.6 oz)   SpO2 99%   BMI 23.95 kg/m  , BMI Body mass index is 23.95 kg/m. GEN: Thin, pleasant, elderly male, in no acute distress  HEENT: normal  Neck: no JVD, no masses. No carotid bruits Cardiac: Irregularly irregular with grade 2/6 systolic murmur  at the left lower sternal border, no diastolic murmur appreciable              Respiratory:  clear to auscultation bilaterally, normal work of breathing GI: soft, nontender, nondistended, + BS MS: no deformity or atrophy  Ext: no pretibial, ankle, or pedal edema Skin: warm and dry, no rash Neuro:  Strength and sensation are intact Psych: euthymic mood, full affect  EKG:  EKG is not ordered today.  Recent Labs: 09/01/2015: TSH 0.170 09/07/2015: Brain Natriuretic Peptide 217.7 10/25/2015: ALT 15; BUN 9; Creatinine, Ser 0.97; Hemoglobin 13.8; Platelets 187; Potassium 4.1; Sodium 128   Lipid Panel     Component Value Date/Time   CHOL 157 10/06/2014 1143   TRIG 76.0 10/06/2014 1143   HDL 49.50 10/06/2014 1143   CHOLHDL 3 10/06/2014 1143   VLDL 15.2 10/06/2014 1143   LDLCALC 92 10/06/2014 1143      Wt Readings from Last 3 Encounters:  11/01/15 86.9 kg (191 lb 9.6 oz)  09/09/15 80 kg (176 lb 6 oz)  09/07/15 84.4 kg (186 lb 1.9 oz)     Cardiac Studies Reviewed: Echo Study Conclusions from 08/2015  - Left ventricle: Diffuse hypokinesis septal and apical akinesis.  The cavity size was severely dilated. Wall thickness was  increased in a pattern of mild LVH. Systolic function was  severely reduced. The estimated ejection fraction was in the  range of 20% to 25%. -  Aortic valve: Post TAVR with normal systolic gradients and mild  perivalvular regurgitation. - Left atrium: The atrium was moderately dilated. - Atrial septum: No defect or patent foramen ovale was identified.  ASSESSMENT AND PLAN: 1.  Chronic systolic heart failure, NYHA IIB: The patient is doing well from a symptomatic perspective. He really has no cardiopulmonary symptoms. He is limited only by weakness and gait instability which is chronic and related to his low back problems. He is unable to tolerate escalation of medical therapy because of chronic hypotension. He's had problems with this with additive therapy in the past. He will continue on scheduled carvedilol and Lasix as needed  2. Aortic valve disease s/p TAVR with moderate paravalvular regurgitation: Most recent echo reviewed with stable findings  3. Chronic atrial fibrillation: Heart rate controlled, tolerating anticoagulation with warfarin without bleeding problems.  4. CAD s/p CABG: No anginal symptoms. Maintained on aspirin 81 mg and atorvastatin  5. Hyperlipidemia: Will check lipids prior to his next office visit with Truitt Merle in 3 months  6. Hyponatremia: Chronic finding dating back several years. He does not appear volume overloaded on exam. Continue furosemide as needed.  Current medicines are reviewed with the patient today.  The patient does not have concerns regarding medicines.  Labs/ tests ordered today include:   Orders Placed This Encounter  Procedures  . Lipid panel  . Basic metabolic panel  . B Nat Peptide    Disposition:   FU 3 months with Truitt Merle, 6 months with me. BMET, BNP, lipid before next visit  Signed, Sherren Mocha, MD  11/03/2015 9:08 PM    [facility], [place], [place]  [address] Phone: [phone]; Fax: [phone]

## 2015-11-02 ENCOUNTER — Ambulatory Visit (HOSPITAL_COMMUNITY): Payer: Medicare Other

## 2015-11-08 ENCOUNTER — Ambulatory Visit (INDEPENDENT_AMBULATORY_CARE_PROVIDER_SITE_OTHER): Payer: Medicare Other | Admitting: Cardiology

## 2015-11-08 DIAGNOSIS — I482 Chronic atrial fibrillation, unspecified: Secondary | ICD-10-CM

## 2015-11-08 DIAGNOSIS — Z5181 Encounter for therapeutic drug level monitoring: Secondary | ICD-10-CM

## 2015-11-08 LAB — POCT INR: INR: 2.4

## 2015-11-10 DIAGNOSIS — K642 Third degree hemorrhoids: Secondary | ICD-10-CM | POA: Diagnosis not present

## 2015-11-10 DIAGNOSIS — K644 Residual hemorrhoidal skin tags: Secondary | ICD-10-CM | POA: Diagnosis not present

## 2015-11-22 ENCOUNTER — Ambulatory Visit (INDEPENDENT_AMBULATORY_CARE_PROVIDER_SITE_OTHER): Payer: Medicare Other | Admitting: Internal Medicine

## 2015-11-22 DIAGNOSIS — Z5181 Encounter for therapeutic drug level monitoring: Secondary | ICD-10-CM

## 2015-11-22 DIAGNOSIS — I482 Chronic atrial fibrillation, unspecified: Secondary | ICD-10-CM

## 2015-11-22 DIAGNOSIS — Z7901 Long term (current) use of anticoagulants: Secondary | ICD-10-CM | POA: Diagnosis not present

## 2015-11-22 LAB — POCT INR: INR: 2.4

## 2015-11-29 ENCOUNTER — Ambulatory Visit (INDEPENDENT_AMBULATORY_CARE_PROVIDER_SITE_OTHER): Payer: Medicare Other

## 2015-11-29 DIAGNOSIS — Z95810 Presence of automatic (implantable) cardiac defibrillator: Secondary | ICD-10-CM | POA: Diagnosis not present

## 2015-11-29 DIAGNOSIS — I5022 Chronic systolic (congestive) heart failure: Secondary | ICD-10-CM | POA: Diagnosis not present

## 2015-11-29 NOTE — Progress Notes (Signed)
EPIC Encounter for ICM Monitoring  Patient Name: [name] is a 82 y.o. male Date: 11/29/2015 Primary Care Physican: [name], MD Primary Cardiologist:Cooper Electrophysiologist: Caryl Comes Dry Weight:  unknown       Heart Failure questions reviewed, pt asymptomatic   Thoracic impedance normal today but has been slightly below baseline in the last month.  He stated it may be related to foods he is eating.   Recommendations: No changes. Advised to limit salt to 2000 mg daily.     Follow-up plan: ICM clinic phone appointment on 01/07/2016.  Copy of ICM check sent to device physician.   ICM trend: 11/29/2015       Rosalene Billings, RN 11/29/2015 11:04 AM

## 2015-12-06 ENCOUNTER — Ambulatory Visit (INDEPENDENT_AMBULATORY_CARE_PROVIDER_SITE_OTHER): Payer: Medicare Other | Admitting: Cardiology

## 2015-12-06 DIAGNOSIS — I482 Chronic atrial fibrillation, unspecified: Secondary | ICD-10-CM

## 2015-12-06 DIAGNOSIS — Z5181 Encounter for therapeutic drug level monitoring: Secondary | ICD-10-CM

## 2015-12-06 LAB — POCT INR: INR: 1.7

## 2015-12-13 ENCOUNTER — Ambulatory Visit (INDEPENDENT_AMBULATORY_CARE_PROVIDER_SITE_OTHER): Payer: Medicare Other | Admitting: Interventional Cardiology

## 2015-12-13 DIAGNOSIS — I482 Chronic atrial fibrillation, unspecified: Secondary | ICD-10-CM

## 2015-12-13 DIAGNOSIS — Z5181 Encounter for therapeutic drug level monitoring: Secondary | ICD-10-CM

## 2015-12-13 LAB — POCT INR: INR: 3.5

## 2015-12-15 DIAGNOSIS — M810 Age-related osteoporosis without current pathological fracture: Secondary | ICD-10-CM | POA: Diagnosis not present

## 2015-12-15 DIAGNOSIS — M546 Pain in thoracic spine: Secondary | ICD-10-CM | POA: Diagnosis not present

## 2015-12-15 DIAGNOSIS — M5136 Other intervertebral disc degeneration, lumbar region: Secondary | ICD-10-CM | POA: Diagnosis not present

## 2015-12-20 ENCOUNTER — Ambulatory Visit (INDEPENDENT_AMBULATORY_CARE_PROVIDER_SITE_OTHER): Payer: Medicare Other | Admitting: Cardiovascular Disease

## 2015-12-20 DIAGNOSIS — I482 Chronic atrial fibrillation, unspecified: Secondary | ICD-10-CM

## 2015-12-20 DIAGNOSIS — Z5181 Encounter for therapeutic drug level monitoring: Secondary | ICD-10-CM

## 2015-12-20 LAB — POCT INR: INR: 2.3

## 2015-12-21 DIAGNOSIS — I482 Chronic atrial fibrillation: Secondary | ICD-10-CM | POA: Diagnosis not present

## 2015-12-21 DIAGNOSIS — Z7901 Long term (current) use of anticoagulants: Secondary | ICD-10-CM | POA: Diagnosis not present

## 2015-12-21 DIAGNOSIS — Z952 Presence of prosthetic heart valve: Secondary | ICD-10-CM | POA: Diagnosis not present

## 2015-12-21 DIAGNOSIS — M1990 Unspecified osteoarthritis, unspecified site: Secondary | ICD-10-CM | POA: Diagnosis not present

## 2015-12-21 DIAGNOSIS — R7309 Other abnormal glucose: Secondary | ICD-10-CM | POA: Diagnosis not present

## 2015-12-21 DIAGNOSIS — L89159 Pressure ulcer of sacral region, unspecified stage: Secondary | ICD-10-CM | POA: Diagnosis not present

## 2015-12-21 DIAGNOSIS — I5022 Chronic systolic (congestive) heart failure: Secondary | ICD-10-CM | POA: Diagnosis not present

## 2015-12-21 DIAGNOSIS — E038 Other specified hypothyroidism: Secondary | ICD-10-CM | POA: Diagnosis not present

## 2015-12-21 DIAGNOSIS — Z6824 Body mass index (BMI) 24.0-24.9, adult: Secondary | ICD-10-CM | POA: Diagnosis not present

## 2015-12-21 DIAGNOSIS — E871 Hypo-osmolality and hyponatremia: Secondary | ICD-10-CM | POA: Diagnosis not present

## 2015-12-27 ENCOUNTER — Other Ambulatory Visit: Payer: Self-pay | Admitting: Cardiovascular Disease

## 2015-12-30 ENCOUNTER — Ambulatory Visit (INDEPENDENT_AMBULATORY_CARE_PROVIDER_SITE_OTHER): Payer: Medicare Other | Admitting: Internal Medicine

## 2015-12-30 DIAGNOSIS — K644 Residual hemorrhoidal skin tags: Secondary | ICD-10-CM | POA: Diagnosis not present

## 2015-12-30 DIAGNOSIS — Z5181 Encounter for therapeutic drug level monitoring: Secondary | ICD-10-CM

## 2015-12-30 DIAGNOSIS — I482 Chronic atrial fibrillation, unspecified: Secondary | ICD-10-CM

## 2015-12-30 DIAGNOSIS — Z7901 Long term (current) use of anticoagulants: Secondary | ICD-10-CM | POA: Diagnosis not present

## 2015-12-30 LAB — POCT INR: INR: 3.8

## 2015-12-30 NOTE — Progress Notes (Signed)
This encounter was created in error - please disregard.

## 2016-01-06 ENCOUNTER — Ambulatory Visit (INDEPENDENT_AMBULATORY_CARE_PROVIDER_SITE_OTHER): Payer: Medicare Other | Admitting: Cardiovascular Disease

## 2016-01-06 DIAGNOSIS — Z5181 Encounter for therapeutic drug level monitoring: Secondary | ICD-10-CM

## 2016-01-06 DIAGNOSIS — I482 Chronic atrial fibrillation, unspecified: Secondary | ICD-10-CM

## 2016-01-06 LAB — POCT INR: INR: 2.9

## 2016-01-07 ENCOUNTER — Ambulatory Visit (INDEPENDENT_AMBULATORY_CARE_PROVIDER_SITE_OTHER): Payer: Medicare Other | Admitting: *Deleted

## 2016-01-07 DIAGNOSIS — I5022 Chronic systolic (congestive) heart failure: Secondary | ICD-10-CM

## 2016-01-07 DIAGNOSIS — Z95810 Presence of automatic (implantable) cardiac defibrillator: Secondary | ICD-10-CM | POA: Diagnosis not present

## 2016-01-07 DIAGNOSIS — I255 Ischemic cardiomyopathy: Secondary | ICD-10-CM

## 2016-01-07 NOTE — Progress Notes (Signed)
EPIC Encounter for ICM Monitoring  Patient Name: [name] is a 82 y.o. male Date: 01/07/2016 Primary Care Physican: [name], MD Primary Cardiologist:[place]oper Electrophysiologist: Caryl Comes Dry Weight:   183 lbs                                                       Heart Failure questions reviewed, pt asymptomatic   Thoracic impedance normal   Recommendations: No changes.  Advised to limit salt intake to 2000 mg daily.  Encouraged to call for fluid symptoms.    Follow-up plan: ICM clinic phone appointment on 02/07/2016.  Copy of ICM check sent to device physician.   ICM trend: 01/07/2016       Rosalene Billings, RN 01/07/2016 11:21 AM

## 2016-01-10 NOTE — Progress Notes (Signed)
Remote ICD transmission.   

## 2016-01-12 ENCOUNTER — Encounter: Payer: Self-pay | Admitting: Cardiology

## 2016-01-20 ENCOUNTER — Ambulatory Visit (INDEPENDENT_AMBULATORY_CARE_PROVIDER_SITE_OTHER): Payer: Medicare Other | Admitting: Cardiology

## 2016-01-20 DIAGNOSIS — I482 Chronic atrial fibrillation, unspecified: Secondary | ICD-10-CM

## 2016-01-20 DIAGNOSIS — Z5181 Encounter for therapeutic drug level monitoring: Secondary | ICD-10-CM

## 2016-01-20 LAB — POCT INR: INR: 3.7

## 2016-01-26 ENCOUNTER — Ambulatory Visit (INDEPENDENT_AMBULATORY_CARE_PROVIDER_SITE_OTHER): Payer: Medicare Other | Admitting: Cardiovascular Disease

## 2016-01-26 DIAGNOSIS — I482 Chronic atrial fibrillation, unspecified: Secondary | ICD-10-CM

## 2016-01-26 DIAGNOSIS — Z5181 Encounter for therapeutic drug level monitoring: Secondary | ICD-10-CM

## 2016-01-26 LAB — POCT INR: INR: 3.4

## 2016-01-31 NOTE — Progress Notes (Signed)
CARDIOLOGY OFFICE NOTE  Date:  02/01/2016    [name] Date of Birth: 1933/03/03 Medical Record [id number]  PCP:  [name], MD  Cardiologist:  Jerel Shepherd    Chief Complaint  Patient presents with  . Cardiomyopathy    Seen for Dr. Burt Knack    History of Present Illness: [name] is a 82 y.o. male who presents today for a 3 month check. Seen for Dr. Burt Knack.   He has a long standing history of chronic systolic heart failure, coronary artery disease, and aortic valve disease. The patient's cardiac history is extensive.  His CAD was initially treated with CABG surgery in 1979. He then had redo CABG in 1991. He's had severe ischemic cardiomyopathy with left ventricular EF as low as 25%. He has an ICD which is followed by Dr. Caryl Comes. He also underwent transcatheter aortic valve replacement the [facility] in 2012 for treatment of severe symptomatic aortic stenosis. He has had moderate perivalvular aortic insufficiency. He also has a history of permanent atrial fibrillation (previously intolerant to amiodarone) and is maintained on long-term anticoagulation therapy with Coumadin - which he checks at home. Also with prior h/o PE.  Seen 3 months ago and felt to be stable.   Comes in today. Actually here by himself today. [name] had another appointment to go to. He is doing well. Very talkative today. Getting a steroid shot in his knee later today. For lab here today. He notes his breathing is good. No chest pain fortunately. Very little swelling - which is chronic. He says he is not worrying very much anymore about anything. No active bleeding. His coumadin has been somewhat labile. He does bruise more easily when INR is on the higher side. No falls.   Past Medical History:  Diagnosis Date  . AF (atrial fibrillation) ([place])    Has not tolerated amiodarone in the past. Amiodarone was stopped in September of 2010 due to side effects  . Aortic stenosis, severe    WITH  PERCUTANEOUS AORTIC VALVE (TAVI) IN March 2012 at the [facility]  . Cervical myelopathy ([place])   . CHF (congestive heart failure) ([place])   . Chronic anticoagulation    on coumadin  . Coronary artery disease   . Diverticulitis    CURRENTLY CONTROLLED WITH NO EVIDENCE OF RECURRENT INFECTION  . Esophageal stricture    WITH DILATATION  . GERD (gastroesophageal reflux disease)   . Heart murmur   . Hyperlipidemia   . Ischemic cardiomyopathy 05/2010   Has EF of 25%  . MI (myocardial infarction) 1974, 1978  . NSVT (nonsustained ventricular tachycardia) ([place])   . Osteoarthritis    RIGHT KNEE  . Osteoporosis   . Prostate cancer ([name])   . Pulmonary embolism ([place])   . Stroke [name])    3 strokes    Past Surgical History:  Procedure Laterality Date  . AORTIC VALVE REPLACEMENT     Percutaneous AVR in March 2012 at the [facility]  . BACK SURGERY    . CARDIAC CATHETERIZATION  2010   SEVERE LV DYSFUNCTION WITH ESTIMATED EJECTION FRACTION OF 25%  . CARDIOVERSION  02/16/2011   Procedure: CARDIOVERSION;  Surgeon: Carlena Bjornstad, MD;  Location: [place];  Service: Cardiovascular;  Laterality: N/A;  . CHOLECYSTECTOMY    . CORONARY ARTERY BYPASS GRAFT  1979  . CORONARY ARTERY BYPASS GRAFT  1991   REDO SURGERY  . ICD  Feb 2003; 02/2013   gen change  02-11-2013 by Dr Caryl Comes  . IMPLANTABLE CARDIOVERTER DEFIBRILLATOR (ICD) GENERATOR CHANGE N/A 02/10/2013   Procedure: ICD GENERATOR CHANGE;  Surgeon: Deboraha Sprang, MD;  Location: [facility] CATH LAB;  Service: Cardiovascular;  Laterality: N/A;  . SHOULDER SURGERY       Medications: Current Outpatient Prescriptions  Medication Sig Dispense Refill  . acetaminophen (TYLENOL) 500 MG tablet Take 1,000 mg by mouth every 6 (six) hours as needed for mild pain.     Marland Kitchen alfuzosin (UROXATRAL) 10 MG 24 hr tablet Take 10 mg by mouth at bedtime.     Marland Kitchen aspirin 81 MG tablet Take 81 mg by mouth daily.    Marland Kitchen atorvastatin (LIPITOR) 10 MG tablet Take 10 mg by mouth every  other day.    . Budesonide-Formoterol Fumarate (SYMBICORT IN) Inhale 1 puff into the lungs 2 (two) times daily.    . calcium carbonate (OS-CAL) 600 MG TABS tablet Take 600 mg by mouth 2 (two) times daily with a meal. 600 +D    . carvedilol (COREG) 6.25 MG tablet Take 1 tablet (6.25 mg total) by mouth 2 (two) times daily with a meal. 60 tablet 11  . cholecalciferol (VITAMIN D) 1000 UNITS tablet Take 1,000 Units by mouth 2 (two) times daily.    Marland Kitchen COUMADIN 5 MG tablet TAKE AS DIRECTED BY COUMADIN CLINIC. 100 tablet 1  . furosemide (LASIX) 20 MG tablet Take 1 tablet (20 mg total) by mouth daily as needed (for swelling). 30 tablet 3  . HYDROcodone-acetaminophen (NORCO/VICODIN) 5-325 MG per tablet Take 1 tablet by mouth every 6 (six) hours as needed for moderate pain. 30 tablet 0  . ipratropium (ATROVENT) 0.03 % nasal spray Place 2 sprays into both nostrils 2 (two) times daily.    Marland Kitchen levothyroxine (SYNTHROID) 125 MCG tablet Take 1 tablet (125 mcg total) by mouth daily before breakfast. 30 tablet 0  . NITROSTAT 0.4 MG SL tablet DISSOLVE 1 TABLET UNDER TONGUE AS NEEDED FOR CHEST PAIN,MAY REPEAT IN5 MINUTES FOR 2 DOSES. 25 tablet 10  . omeprazole (PRILOSEC) 20 MG capsule Take 20 mg by mouth daily as needed (acid reflux).    . polyethylene glycol (MIRALAX / GLYCOLAX) packet Take 17 g by mouth daily as needed for mild constipation.     . Probiotic Product (PROBIOTIC DAILY PO) Take 1 capsule by mouth daily.    . RESTASIS 0.05 % ophthalmic emulsion Place 1 drop into both eyes every 12 (twelve) hours.      No current facility-administered medications for this visit.     Allergies: Allergies  Allergen Reactions  . Penicillins Shortness Of Breath and Rash    Has patient had a PCN reaction causing immediate rash, facial/tongue/throat swelling, SOB or lightheadedness with hypotension: Yes Has patient had a PCN reaction causing severe rash involving mucus membranes or skin necrosis: No Has patient had a PCN  reaction that required hospitalization No Has patient had a PCN reaction occurring within the last 10 years: No If all of the above answers are "NO", then may proceed with Cephalosporin use.   . Tizanidine Shortness Of Breath    Light headed  . Ace Inhibitors     Has not tolerated in the past due to hyperkalemia  . Antihistamines, Diphenhydramine-Type Other (See Comments)    Inhibits urination  . Amiodarone Other (See Comments)    Unknown     Social History: The patient  reports that he quit smoking about 46 years ago. His smoking use included Cigarettes. He has  a 60.00 pack-year smoking history. He has never used smokeless tobacco. He reports that he does not drink alcohol or use drugs.   Family History: The patient's family history includes Diabetes (age of onset: 28) in his mother; Heart disease (age of onset: 73) in his mother; Heart disease (age of onset: 53) in his father.   Review of Systems: Please see the history of present illness.   Otherwise, the review of systems is positive for none.   All other systems are reviewed and negative.   Physical Exam: VS:  BP 120/62   Pulse 60   Ht 6' 2.5" (1.892 m)   Wt 193 lb 6.4 oz (87.7 kg)   BMI 24.50 kg/m  .  BMI Body mass index is 24.5 kg/m.  Wt Readings from Last 3 Encounters:  02/01/16 193 lb 6.4 oz (87.7 kg)  11/01/15 191 lb 9.6 oz (86.9 kg)  09/09/15 176 lb 6 oz (80 kg)    General: Pleasant. Elderly male who is alert and in no acute distress.  Chronically ill appearing.  HEENT: Normal.  Neck: Supple, no JVD, carotid bruits, or masses noted.  Cardiac: Irregular irregular rhythm. Rate ok.  No edema.  Respiratory:  Lungs are clear to auscultation bilaterally with normal work of breathing.  GI: Soft and nontender.  MS: No deformity or atrophy. Gait and ROM intact. He is a little kyphotic. Using a walker.  Skin: Warm and dry. Color is normal.  Neuro:  Strength and sensation are intact and no gross focal deficits noted.    Psych: Alert, appropriate and with normal affect.   LABORATORY DATA:  EKG:  EKG is not ordered today.  Lab Results  Component Value Date   WBC 6.7 10/25/2015   HGB 13.8 10/25/2015   HCT 41.9 10/25/2015   PLT 187 10/25/2015   GLUCOSE 82 10/25/2015   CHOL 157 10/06/2014   TRIG 76.0 10/06/2014   HDL 49.50 10/06/2014   LDLCALC 92 10/06/2014   ALT 15 (L) 10/25/2015   AST 20 10/25/2015   NA 128 (L) 10/25/2015   K 4.1 10/25/2015   CL 93 (L) 10/25/2015   CREATININE 0.97 10/25/2015   BUN 9 10/25/2015   CO2 30 10/25/2015   TSH 0.170 (L) 09/01/2015   PSA 0.62 08/12/2008   INR 3.4 01/26/2016   HGBA1C 5.9 (H) 12/03/2011    BNP (last 3 results)  Recent Labs  07/19/15 1116 09/01/15 1713 09/07/15 1243  BNP 204.4* 213.2* 217.7*    ProBNP (last 3 results) No results for input(s): PROBNP in the last 8760 hours.   Other Studies Reviewed Today:  Echo Study Conclusions from 08/2015  - Left ventricle: Diffuse hypokinesis septal and apical akinesis.  The cavity size was severely dilated. Wall thickness was  increased in a pattern of mild LVH. Systolic function was  severely reduced. The estimated ejection fraction was in the  range of 20% to 25%. - Aortic valve: Post TAVR with normal systolic gradients and mild  perivalvular regurgitation. - Left atrium: The atrium was moderately dilated. - Atrial septum: No defect or patent foramen ovale was identified.  ASSESSMENT AND PLAN: 1.  Chronic systolic heart failure, NYHA IIB: The patient is doing well from a symptomatic perspective. He really has no cardiopulmonary symptoms. He continues to be limited by weakness and gait instability which is chronic and related to his low back problems. He is unable to tolerate escalation of medical therapy because of chronic hypotension. He's had problems with this with  additive therapy in the past. He will continue on scheduled carvedilol and Lasix as needed  2. Aortic valve disease s/p  TAVR with moderate paravalvular regurgitation: Most recent echo reviewed with stable findings  3. Chronic atrial fibrillation: Heart rate controlled, tolerating anticoagulation with warfarin without bleeding problems.  4. CAD s/p CABG: No anginal symptoms. Maintained on aspirin 81 mg and atorvastatin  5. Hyperlipidemia: Lab today  6. Hyponatremia: Chronic finding dating back several years. He does not appear volume overloaded on exam. Continue furosemide as needed.  Current medicines are reviewed with the patient today.  The patient does not have concerns regarding medicines other than what has been noted above.  The following changes have been made:  See above.  Labs/ tests ordered today include:   No orders of the defined types were placed in this encounter.    Disposition:   FU with me and Dr. Burt Knack in 3 months. Lab today. Overall, he seems to be holding his own.   Patient is agreeable to this plan and will call if any problems develop in the interim.   Signed: Burtis Junes, RN, ANP-C 02/01/2016 10:51 AM  [facility] [address] [place],   [address] Phone: [phone] Fax: [phone]

## 2016-02-01 ENCOUNTER — Encounter: Payer: Self-pay | Admitting: Nurse Practitioner

## 2016-02-01 ENCOUNTER — Ambulatory Visit (INDEPENDENT_AMBULATORY_CARE_PROVIDER_SITE_OTHER): Payer: Medicare Other | Admitting: Nurse Practitioner

## 2016-02-01 ENCOUNTER — Other Ambulatory Visit: Payer: Medicare Other | Admitting: *Deleted

## 2016-02-01 VITALS — BP 120/62 | HR 60 | Ht 74.5 in | Wt 193.4 lb

## 2016-02-01 DIAGNOSIS — M25561 Pain in right knee: Secondary | ICD-10-CM | POA: Diagnosis not present

## 2016-02-01 DIAGNOSIS — Z952 Presence of prosthetic heart valve: Secondary | ICD-10-CM | POA: Diagnosis not present

## 2016-02-01 DIAGNOSIS — I255 Ischemic cardiomyopathy: Secondary | ICD-10-CM

## 2016-02-01 DIAGNOSIS — I5022 Chronic systolic (congestive) heart failure: Secondary | ICD-10-CM | POA: Diagnosis not present

## 2016-02-01 DIAGNOSIS — M5136 Other intervertebral disc degeneration, lumbar region: Secondary | ICD-10-CM | POA: Diagnosis not present

## 2016-02-01 DIAGNOSIS — I482 Chronic atrial fibrillation, unspecified: Secondary | ICD-10-CM

## 2016-02-01 DIAGNOSIS — E785 Hyperlipidemia, unspecified: Secondary | ICD-10-CM

## 2016-02-01 DIAGNOSIS — I359 Nonrheumatic aortic valve disorder, unspecified: Secondary | ICD-10-CM | POA: Diagnosis not present

## 2016-02-01 LAB — BASIC METABOLIC PANEL
BUN: 9 mg/dL (ref 7–25)
CO2: 27 mmol/L (ref 20–31)
Calcium: 8.7 mg/dL (ref 8.6–10.3)
Chloride: 97 mmol/L — ABNORMAL LOW (ref 98–110)
Creat: 1 mg/dL (ref 0.70–1.11)
Glucose, Bld: 90 mg/dL (ref 65–99)
Potassium: 4.4 mmol/L (ref 3.5–5.3)
Sodium: 131 mmol/L — ABNORMAL LOW (ref 135–146)

## 2016-02-01 LAB — LIPID PANEL
CHOL/HDL RATIO: 2.8 ratio (ref <5.0)
CHOLESTEROL: 142 mg/dL (ref <200)
HDL: 51 mg/dL (ref >40)
LDL Cholesterol: 77 mg/dL (ref <100)
TRIGLYCERIDES: 72 mg/dL (ref <150)
VLDL: 14 mg/dL (ref <30)

## 2016-02-01 LAB — BRAIN NATRIURETIC PEPTIDE: Brain Natriuretic Peptide: 335.7 pg/mL — ABNORMAL HIGH (ref <100)

## 2016-02-01 NOTE — Patient Instructions (Addendum)
We will be checking the following labs today - Lipid, BMET and BNP   Medication Instructions:    Continue with your current medicines.     Testing/Procedures To Be Arranged:  N/A  Follow-Up:   See me and Dr. Burt Knack in 3 months    Other Special Instructions:   Keep up the good work!    If you need a refill on your cardiac medications before your next appointment, please call your pharmacy.   Call the [place] office at [phone] if you have any questions, problems or concerns.

## 2016-02-01 NOTE — Addendum Note (Signed)
Addended by: Eulis Foster on: 02/01/2016 11:10 AM   Modules accepted: Orders

## 2016-02-02 ENCOUNTER — Ambulatory Visit (INDEPENDENT_AMBULATORY_CARE_PROVIDER_SITE_OTHER): Payer: Medicare Other | Admitting: Cardiovascular Disease

## 2016-02-02 DIAGNOSIS — Z5181 Encounter for therapeutic drug level monitoring: Secondary | ICD-10-CM

## 2016-02-02 DIAGNOSIS — I482 Chronic atrial fibrillation, unspecified: Secondary | ICD-10-CM

## 2016-02-02 DIAGNOSIS — Z7901 Long term (current) use of anticoagulants: Secondary | ICD-10-CM | POA: Diagnosis not present

## 2016-02-02 LAB — POCT INR: INR: 2.5

## 2016-02-03 LAB — CUP PACEART REMOTE DEVICE CHECK
Battery Voltage: 3.02 V
Brady Statistic RV Percent Paced: 6.93 %
Date Time Interrogation Session: 20171027071706
HIGH POWER IMPEDANCE MEASURED VALUE: 41 Ohm
HIGH POWER IMPEDANCE MEASURED VALUE: 51 Ohm
Implantable Lead Implant Date: 20021220
Lead Channel Impedance Value: 456 Ohm
Lead Channel Impedance Value: 551 Ohm
Lead Channel Pacing Threshold Amplitude: 0.75 V
Lead Channel Sensing Intrinsic Amplitude: 18.875 mV
Lead Channel Sensing Intrinsic Amplitude: 18.875 mV
Lead Channel Setting Sensing Sensitivity: 0.3 mV
MDC IDC LEAD LOCATION: 753860
MDC IDC LEAD MODEL: 6944
MDC IDC MSMT BATTERY REMAINING LONGEVITY: 117 mo
MDC IDC MSMT LEADCHNL RV PACING THRESHOLD PULSEWIDTH: 0.4 ms
MDC IDC PG IMPLANT DT: 20141201
MDC IDC SET LEADCHNL RV PACING AMPLITUDE: 2 V
MDC IDC SET LEADCHNL RV PACING PULSEWIDTH: 0.4 ms

## 2016-02-07 ENCOUNTER — Emergency Department (HOSPITAL_COMMUNITY): Payer: Medicare Other

## 2016-02-07 ENCOUNTER — Ambulatory Visit (INDEPENDENT_AMBULATORY_CARE_PROVIDER_SITE_OTHER): Payer: Medicare Other

## 2016-02-07 ENCOUNTER — Ambulatory Visit (INDEPENDENT_AMBULATORY_CARE_PROVIDER_SITE_OTHER): Payer: Medicare Other | Admitting: Cardiovascular Disease

## 2016-02-07 ENCOUNTER — Emergency Department (HOSPITAL_COMMUNITY)
Admission: EM | Admit: 2016-02-07 | Discharge: 2016-02-07 | Disposition: A | Discharge status: Home / Self Care | Payer: Medicare Other | Attending: Emergency Medicine | Admitting: Emergency Medicine

## 2016-02-07 ENCOUNTER — Encounter (HOSPITAL_COMMUNITY): Payer: Self-pay

## 2016-02-07 DIAGNOSIS — Y999 Unspecified external cause status: Secondary | ICD-10-CM | POA: Diagnosis not present

## 2016-02-07 DIAGNOSIS — W1830XA Fall on same level, unspecified, initial encounter: Secondary | ICD-10-CM | POA: Diagnosis not present

## 2016-02-07 DIAGNOSIS — I252 Old myocardial infarction: Secondary | ICD-10-CM | POA: Insufficient documentation

## 2016-02-07 DIAGNOSIS — Z951 Presence of aortocoronary bypass graft: Secondary | ICD-10-CM | POA: Diagnosis not present

## 2016-02-07 DIAGNOSIS — Z5181 Encounter for therapeutic drug level monitoring: Secondary | ICD-10-CM

## 2016-02-07 DIAGNOSIS — Z8673 Personal history of transient ischemic attack (TIA), and cerebral infarction without residual deficits: Secondary | ICD-10-CM | POA: Diagnosis not present

## 2016-02-07 DIAGNOSIS — S59902A Unspecified injury of left elbow, initial encounter: Secondary | ICD-10-CM | POA: Diagnosis present

## 2016-02-07 DIAGNOSIS — M5489 Other dorsalgia: Secondary | ICD-10-CM | POA: Diagnosis not present

## 2016-02-07 DIAGNOSIS — Z8546 Personal history of malignant neoplasm of prostate: Secondary | ICD-10-CM | POA: Diagnosis not present

## 2016-02-07 DIAGNOSIS — Z87891 Personal history of nicotine dependence: Secondary | ICD-10-CM | POA: Insufficient documentation

## 2016-02-07 DIAGNOSIS — Z23 Encounter for immunization: Secondary | ICD-10-CM | POA: Insufficient documentation

## 2016-02-07 DIAGNOSIS — S5002XA Contusion of left elbow, initial encounter: Secondary | ICD-10-CM | POA: Diagnosis not present

## 2016-02-07 DIAGNOSIS — I2510 Atherosclerotic heart disease of native coronary artery without angina pectoris: Secondary | ICD-10-CM | POA: Insufficient documentation

## 2016-02-07 DIAGNOSIS — S51012A Laceration without foreign body of left elbow, initial encounter: Secondary | ICD-10-CM | POA: Insufficient documentation

## 2016-02-07 DIAGNOSIS — S4992XA Unspecified injury of left shoulder and upper arm, initial encounter: Secondary | ICD-10-CM | POA: Diagnosis not present

## 2016-02-07 DIAGNOSIS — Y929 Unspecified place or not applicable: Secondary | ICD-10-CM | POA: Diagnosis not present

## 2016-02-07 DIAGNOSIS — R4789 Other speech disturbances: Secondary | ICD-10-CM | POA: Diagnosis not present

## 2016-02-07 DIAGNOSIS — M25512 Pain in left shoulder: Secondary | ICD-10-CM | POA: Diagnosis not present

## 2016-02-07 DIAGNOSIS — Y939 Activity, unspecified: Secondary | ICD-10-CM | POA: Diagnosis not present

## 2016-02-07 DIAGNOSIS — Z7982 Long term (current) use of aspirin: Secondary | ICD-10-CM | POA: Diagnosis not present

## 2016-02-07 DIAGNOSIS — Z95810 Presence of automatic (implantable) cardiac defibrillator: Secondary | ICD-10-CM | POA: Insufficient documentation

## 2016-02-07 DIAGNOSIS — R4701 Aphasia: Secondary | ICD-10-CM | POA: Diagnosis not present

## 2016-02-07 DIAGNOSIS — S40022A Contusion of left upper arm, initial encounter: Secondary | ICD-10-CM | POA: Insufficient documentation

## 2016-02-07 DIAGNOSIS — I110 Hypertensive heart disease with heart failure: Secondary | ICD-10-CM | POA: Diagnosis not present

## 2016-02-07 DIAGNOSIS — T148XXA Other injury of unspecified body region, initial encounter: Secondary | ICD-10-CM | POA: Diagnosis not present

## 2016-02-07 DIAGNOSIS — I482 Chronic atrial fibrillation, unspecified: Secondary | ICD-10-CM

## 2016-02-07 DIAGNOSIS — I5022 Chronic systolic (congestive) heart failure: Secondary | ICD-10-CM | POA: Diagnosis not present

## 2016-02-07 DIAGNOSIS — S60222A Contusion of left hand, initial encounter: Secondary | ICD-10-CM | POA: Diagnosis not present

## 2016-02-07 DIAGNOSIS — R479 Unspecified speech disturbances: Secondary | ICD-10-CM

## 2016-02-07 DIAGNOSIS — S0990XA Unspecified injury of head, initial encounter: Secondary | ICD-10-CM | POA: Diagnosis not present

## 2016-02-07 DIAGNOSIS — W19XXXA Unspecified fall, initial encounter: Secondary | ICD-10-CM

## 2016-02-07 DIAGNOSIS — R4702 Dysphasia: Secondary | ICD-10-CM | POA: Diagnosis not present

## 2016-02-07 LAB — CBC WITH DIFFERENTIAL/PLATELET
Basophils Absolute: 0 10*3/uL (ref 0.0–0.1)
Basophils Relative: 1 %
Eosinophils Absolute: 0.3 10*3/uL (ref 0.0–0.7)
Eosinophils Relative: 4 %
HCT: 39.8 % (ref 39.0–52.0)
Hemoglobin: 13.2 g/dL (ref 13.0–17.0)
Lymphocytes Relative: 13 %
Lymphs Abs: 0.9 10*3/uL (ref 0.7–4.0)
MCH: 29.9 pg (ref 26.0–34.0)
MCHC: 33.2 g/dL (ref 30.0–36.0)
MCV: 90 fL (ref 78.0–100.0)
Monocytes Absolute: 0.7 10*3/uL (ref 0.1–1.0)
Monocytes Relative: 10 %
Neutro Abs: 5 10*3/uL (ref 1.7–7.7)
Neutrophils Relative %: 73 %
Platelets: 190 10*3/uL (ref 150–400)
RBC: 4.42 MIL/uL (ref 4.22–5.81)
RDW: 14 % (ref 11.5–15.5)
WBC: 6.9 10*3/uL (ref 4.0–10.5)

## 2016-02-07 LAB — PROTIME-INR
INR: 3.75
Prothrombin Time: 38 s — ABNORMAL HIGH (ref 11.4–15.2)

## 2016-02-07 LAB — BASIC METABOLIC PANEL
Anion gap: 7 (ref 5–15)
BUN: 12 mg/dL (ref 6–20)
CHLORIDE: 96 mmol/L — AB (ref 101–111)
CO2: 28 mmol/L (ref 22–32)
CREATININE: 0.96 mg/dL (ref 0.61–1.24)
Calcium: 8.9 mg/dL (ref 8.9–10.3)
GFR calc Af Amer: >60 mL/min (ref >60)
GFR calc non Af Amer: >60 mL/min (ref >60)
Glucose, Bld: 100 mg/dL — ABNORMAL HIGH (ref 65–99)
Potassium: 4.4 mmol/L (ref 3.5–5.1)
SODIUM: 131 mmol/L — AB (ref 135–145)

## 2016-02-07 LAB — CBG MONITORING, ED: Glucose-Capillary: 84 mg/dL (ref 65–99)

## 2016-02-07 MED ORDER — TETANUS-DIPHTH-ACELL PERTUSSIS 5-2.5-18.5 LF-MCG/0.5 IM SUSP
0.5000 mL | Freq: Once | INTRAMUSCULAR | Status: AC
  Administered 2016-02-07: 0.5 mL via INTRAMUSCULAR
  Filled 2016-02-07: qty 0.5

## 2016-02-07 MED ORDER — IOPAMIDOL (ISOVUE-370) INJECTION 76%
INTRAVENOUS | Status: AC
  Administered 2016-02-07: 50 mL via INTRAVENOUS
  Filled 2016-02-07: qty 50

## 2016-02-07 NOTE — ED Notes (Signed)
Pt and wife state they understand instructions. Home stable via wc with wife. All questions answered.

## 2016-02-07 NOTE — Progress Notes (Signed)
EPIC Encounter for ICM Monitoring  Patient Name: [name] is a 82 y.o. male Date: 02/07/2016 Primary Care Physican: [name], MD Primary Cardiologist:Cooper Electrophysiologist: Faustino Congress Weight:  183lbs      Heart Failure questions reviewed, pt asymptomatic.  Patient in ER today for fall at home which resulted in tearing of the skin  Thoracic impedance normal   Recommendations: No changes.  Reinforced low salt food choices and limiting fluid intake to < 2 liters per day. Encouraged to call for fluid symptoms.    Follow-up plan: ICM clinic phone appointment on 03/09/2016.  Copy of ICM check sent to device physician.   ICM trend: 02/07/2016      Rosalene Billings, RN 02/07/2016 3:51 PM

## 2016-02-07 NOTE — ED Notes (Signed)
Wound at left upper arm and left lateral hand dressed with gauze and cling.

## 2016-02-07 NOTE — ED Notes (Signed)
RN to start IV and draw blood upon pt arrival back form x-ray. Pt OTF.

## 2016-02-07 NOTE — ED Provider Notes (Signed)
[facility] DEPT Provider Note   CSN: [id number] Arrival date & time: 02/07/16  0429     History   Chief Complaint Chief Complaint  Patient presents with  . Transient Ischemic Attack    HPI [name] is a 82 y.o. male.  HPI  This is an 82 year old male with history of atrial fibrillation, PE, heart failure, ischemic cardiomyopathy who presents with word finding difficulty. Patient reports that he got up at approximately 2 AM to urinate. He came back to the bed and was having difficulty finding his words. He states that "I would think of a word but something different would come out." His wife noted the speech difficulty. It lasted for approximately 45 minutes. Has a history of TIA. Is on Coumadin for both PE and atrial fibrillation. He went to bed at 10 PM. Patient currently states that he feels better. Of note, he reports that he fell after his knee gave out yesterday. He denies hitting his head or loss of consciousness. He does report injury to the left arm. He did recently have his Coumadin dose increased. Denies any chest pain, shortness of breath, nausea, vomiting. Denies any weakness, numbness, tingling or other strokelike symptoms.  Past Medical History:  Diagnosis Date  . AF (atrial fibrillation) ([place])    Has not tolerated amiodarone in the past. Amiodarone was stopped in September of 2010 due to side effects  . Aortic stenosis, severe    WITH PERCUTANEOUS AORTIC VALVE (TAVI) IN March 2012 at the [facility]  . Cervical myelopathy ([place])   . CHF (congestive heart failure) ([place])   . Chronic anticoagulation    on coumadin  . Coronary artery disease   . Diverticulitis    CURRENTLY CONTROLLED WITH NO EVIDENCE OF RECURRENT INFECTION  . Esophageal stricture    WITH DILATATION  . GERD (gastroesophageal reflux disease)   . Heart murmur   . Hyperlipidemia   . Ischemic cardiomyopathy 05/2010   Has EF of 25%  . MI (myocardial infarction) 1974, 1978  . NSVT  (nonsustained ventricular tachycardia) ([place])   . Osteoarthritis    RIGHT KNEE  . Osteoporosis   . Prostate cancer ([place])   . Pulmonary embolism ([place])   . Stroke [facility])    3 strokes    Patient Active Problem List   Diagnosis Date Noted  . Shortness of breath   . Weakness 09/01/2015  . Hyponatremia 09/01/2015  . Hypothyroidism 09/01/2015  . DOE (dyspnea on exertion) 09/01/2015  . Chronic systolic CHF (congestive heart failure) ([place]) 09/01/2015  . Exertional dyspnea 09/01/2015  . GI bleed 01/16/2014  . GIB (gastrointestinal bleeding) 01/16/2014  . Encounter for therapeutic drug monitoring 05/09/2013  . Automatic implantable cardioverter-defibrillator in situ 12/13/2012  . Rectal bleed 05/30/2012    Class: Acute  . Acute low back pain 05/30/2012    Class: Acute  . Hypotension 05/30/2012    Class: Acute  . Vertebral fracture, osteoporotic ([place]) 05/21/2012  . Gait instability 12/02/2011    Class: Acute  . Atrial fibrillation, chronic ([place]) 12/02/2011    Class: Chronic  . Anticoagulated on Coumadin 12/02/2011  . Deformity, finger acquired 12/19/2010  . Chronic cough 12/05/2010  . S/P aortic valve replacement 10/22/2010  . Ischemic cardiomyopathy   . Diverticulitis   . Prostate cancer ([place])   . Osteoarthritis   . Cervical myelopathy ([place])   . Pulmonary embolism ([place])   . Hyperlipidemia   . GERD (gastroesophageal reflux disease)   . Esophageal stricture   .  Indigestion 06/29/2010  . VENTRICULAR TACHYCARDIA 01/15/2009    Past Surgical History:  Procedure Laterality Date  . AORTIC VALVE REPLACEMENT     Percutaneous AVR in March 2012 at the [facility]  . BACK SURGERY    . CARDIAC CATHETERIZATION  2010   SEVERE LV DYSFUNCTION WITH ESTIMATED EJECTION FRACTION OF 25%  . CARDIOVERSION  02/16/2011   Procedure: CARDIOVERSION;  Surgeon: Carlena Bjornstad, MD;  Location: [facility];  Service: Cardiovascular;  Laterality: N/A;  . CHOLECYSTECTOMY    . CORONARY ARTERY BYPASS GRAFT   1979  . CORONARY ARTERY BYPASS GRAFT  1991   REDO SURGERY  . ICD  Feb 2003; 02/2013   gen change 02-11-2013 by Dr Caryl Comes  . IMPLANTABLE CARDIOVERTER DEFIBRILLATOR (ICD) GENERATOR CHANGE N/A 02/10/2013   Procedure: ICD GENERATOR CHANGE;  Surgeon: Deboraha Sprang, MD;  Location: [facility] CATH LAB;  Service: Cardiovascular;  Laterality: N/A;  . SHOULDER SURGERY         Home Medications    Prior to Admission medications   Medication Sig Start Date End Date Taking? Authorizing Provider  acetaminophen (TYLENOL) 500 MG tablet Take 1,000 mg by mouth every 6 (six) hours as needed for mild pain.    Yes Historical Provider, MD  alfuzosin (UROXATRAL) 10 MG 24 hr tablet Take 10 mg by mouth at bedtime.    Yes Historical Provider, MD  aspirin 81 MG tablet Take 81 mg by mouth daily.   Yes Historical Provider, MD  atorvastatin (LIPITOR) 10 MG tablet Take 10 mg by mouth every other day.   Yes Historical Provider, MD  Budesonide-Formoterol Fumarate (SYMBICORT IN) Inhale 1 puff into the lungs 2 (two) times daily.   Yes Historical Provider, MD  carvedilol (COREG) 6.25 MG tablet Take 1 tablet (6.25 mg total) by mouth 2 (two) times daily with a meal. 07/29/15  Yes Sherren Mocha, MD  cholecalciferol (VITAMIN D) 1000 UNITS tablet Take 1,000 Units by mouth 2 (two) times daily.   Yes Historical Provider, MD  COUMADIN 5 MG tablet TAKE AS DIRECTED BY COUMADIN CLINIC. Patient taking differently: Take 5mg  daily. 12/27/15  Yes Sherren Mocha, MD  furosemide (LASIX) 20 MG tablet Take 1 tablet (20 mg total) by mouth daily as needed (for swelling). 03/19/15  Yes Sherren Mocha, MD  ipratropium (ATROVENT) 0.03 % nasal spray Place 2 sprays into both nostrils 2 (two) times daily.   Yes Historical Provider, MD  levothyroxine (SYNTHROID) 125 MCG tablet Take 1 tablet (125 mcg total) by mouth daily before breakfast. 09/02/15  Yes Hosie Poisson, MD  NITROSTAT 0.4 MG SL tablet DISSOLVE 1 TABLET UNDER TONGUE AS NEEDED FOR CHEST PAIN,MAY REPEAT  IN5 MINUTES FOR 2 DOSES. 07/26/15  Yes Sherren Mocha, MD  omeprazole (PRILOSEC) 20 MG capsule Take 20 mg by mouth daily as needed (acid reflux).   Yes Historical Provider, MD  polyethylene glycol (MIRALAX / GLYCOLAX) packet Take 17 g by mouth daily as needed for mild constipation.    Yes Historical Provider, MD  Probiotic Product (PROBIOTIC DAILY PO) Take 1 capsule by mouth daily.   Yes Historical Provider, MD  RESTASIS 0.05 % ophthalmic emulsion Place 1 drop into both eyes every 12 (twelve) hours.  12/05/11  Yes Historical Provider, MD    Family History Family History  Problem Relation Age of Onset  . Heart disease Mother 30  . Diabetes Mother 26  . Heart disease Father 27    Social History Social History  Substance Use Topics  . Smoking  status: Former Smoker    Packs/day: 4.00    Years: 15.00    Types: Cigarettes    Quit date: 06/15/1969  . Smokeless tobacco: Never Used  . Alcohol use No     Allergies   Penicillins; Tizanidine; Ace inhibitors; Antihistamines, diphenhydramine-type; and Amiodarone   Review of Systems Review of Systems  Constitutional: Negative for fever.  Eyes: Negative for visual disturbance.  Respiratory: Negative for shortness of breath.   Cardiovascular: Negative for chest pain.  Gastrointestinal: Negative for abdominal pain, nausea and vomiting.  Musculoskeletal: Negative for neck pain.  Skin: Positive for wound.  Neurological: Positive for speech difficulty. Negative for dizziness and weakness.  All other systems reviewed and are negative.    Physical Exam Updated Vital Signs BP 123/69 (BP Location: Right Arm)   Pulse (!) 57   Temp 97.8 F (36.6 C) (Oral)   Resp 16   Ht 6\' 2"  (1.88 m)   Wt 184 lb (83.5 kg)   SpO2 99%   BMI 23.62 kg/m   Physical Exam  Constitutional: He is oriented to person, place, and time. No distress.  Elderly, no acute distress  HENT:  Head: Normocephalic and atraumatic.  Eyes: EOM are normal. Pupils are equal,  round, and reactive to light.  Neck: Normal range of motion. Neck supple.  No midline C-spine tenderness  Cardiovascular: Normal rate and normal heart sounds.   No murmur heard. Irregular rhythm  Pulmonary/Chest: Effort normal and breath sounds normal. No respiratory distress. He has no wheezes.  Abdominal: Soft. Bowel sounds are normal. There is no tenderness. There is no rebound.  Musculoskeletal: He exhibits no edema or deformity.  Bruising and tenderness to palpation over the mid proximal left arm, no obvious deformities, large skin tear noted over the dorsum of the arm just proximal to the elbow as well as over the left wrist, 2+ radial pulse  Neurological: He is alert and oriented to person, place, and time.  Fluent speech, can name and repeat, 5 out of 5 strength in all 4 extremities, no dysmetria to finger-nose-finger, no drift  Skin: Skin is warm and dry.  Psychiatric: He has a normal mood and affect.  Nursing note and vitals reviewed.    ED Treatments / Results  Labs (all labs ordered are listed, but only abnormal results are displayed) Labs Reviewed  BASIC METABOLIC PANEL - Abnormal; Notable for the following:       Result Value   Sodium 131 (*)    Chloride 96 (*)    Glucose, Bld 100 (*)    All other components within normal limits  PROTIME-INR - Abnormal; Notable for the following:    Prothrombin Time 38.0 (*)    All other components within normal limits  CBC WITH DIFFERENTIAL/PLATELET  CBG MONITORING, ED    EKG  EKG Interpretation  Date/Time:  Monday February 07 2016 04:30:36 EST Ventricular Rate:  70 PR Interval:    QRS Duration: 106 QT Interval:  409 QTC Calculation: 442 R Axis:   91 Text Interpretation:  Atrial fibrillation Paired ventricular premature complexes Right axis deviation Borderline repolarization abnormality Baseline wander in lead(s) I III aVR aVL aVF V1 V2 V3 V4 V5 V6 Confirmed by Yoav Okane  MD, Loma Sousa ([id number]) on 02/07/2016 8:25:54 AM        Radiology Ct Angio Head W Or Wo Contrast  Result Date: 02/07/2016 CLINICAL DATA:  Word finding difficulty that has subsequently resolved. EXAM: CT ANGIOGRAPHY HEAD AND NECK TECHNIQUE: Multidetector CT imaging of  the head and neck was performed using the standard protocol during bolus administration of intravenous contrast. Multiplanar CT image reconstructions and MIPs were obtained to evaluate the vascular anatomy. Carotid stenosis measurements (when applicable) are obtained utilizing NASCET criteria, using the distal internal carotid diameter as the denominator. CONTRAST:  50 cc Isovue 370 intravenous COMPARISON:  Head CT from earlier today FINDINGS: CTA NECK FINDINGS Aortic arch: Atherosclerosis. No aneurysm or dissection where seen. Partly visualized CABG. 3 vessel branching Right carotid system: Atheromatous wall thickening of the common carotid with predominantly calcified plaque at the bifurcation. No flow limiting stenosis, dissection, or ulceration. Left carotid system: Predominately calcified plaque at the bifurcation without flow limiting stenosis, dissection, or ulceration. Vertebral arteries: No stenosis in the proximal subclavian arteries. Calcified plaque at both vertebral ostia with mild narrowing on the right. The vertebral arteries are smooth and widely patent to the dura. Skeleton: Degenerative changes without acute or aggressive finding. Other neck: No significant incidental finding Upper chest: Negative Review of the MIP images confirms the above findings CTA HEAD FINDINGS Anterior circulation: Symmetric carotid arteries. Extensive atherosclerotic calcification on the siphons. No major branch occlusion or flow limiting stenosis. Tiny inferior outpouching from the anterior communicating artery region likely has an emanating vessel on reformats. No suspected aneurysm. Posterior circulation: Symmetric vertebral arteries. Symmetric prominent calcified plaque beyond the dural penetration  without flow limiting stenosis. Large posterior communicating artery on the right. No major branch occlusion. Negative for aneurysm. Venous sinuses: As permitted by contrast timing, patent Anatomic variants: Fetal type PCA on the right. Delayed phase: Negative for mass or parenchymal enhancement. Extensive chronic microvascular disease in the cerebral white matter. Chronic left sphenoid sinusitis. Review of the MIP images confirms the above findings IMPRESSION: 1. No acute finding. 2. Atherosclerosis without flow limiting stenosis in the head or neck. 3. Extensive chronic microvascular disease in the cerebral white matter. Electronically Signed   By: Monte Fantasia M.D.   On: 02/07/2016 08:17   Dg Elbow Complete Left  Result Date: 02/07/2016 CLINICAL DATA:  Patient fell striking a door frame. Bruising to the posterior left elbow. EXAM: LEFT ELBOW - COMPLETE 3+ VIEW COMPARISON:  None. FINDINGS: There is no evidence of fracture, dislocation, or joint effusion. There is no evidence of arthropathy or other focal bone abnormality. Soft tissues are unremarkable. Vascular calcifications. IMPRESSION: No acute bony abnormalities. Electronically Signed   By: Lucienne Capers M.D.   On: 02/07/2016 05:31   Ct Head Wo Contrast  Result Date: 02/07/2016 CLINICAL DATA:  82 y/o  M; status post fall. EXAM: CT HEAD WITHOUT CONTRAST TECHNIQUE: Contiguous axial images were obtained from the base of the skull through the vertex without intravenous contrast. COMPARISON:  12/03/2011 CT head. FINDINGS: Brain: No evidence of acute infarction, hemorrhage, hydrocephalus, extra-axial collection or mass lesion/mass effect. Moderate chronic microvascular ischemic changes and brain parenchymal volume loss are progressed from 2013. Vascular: No hyperdense vessel. Calcific atherosclerosis of carotid siphons. Skull: Normal. Negative for fracture or focal lesion. Sinuses/Orbits: Partial opacification of the left sphenoid sinus and posterior  ethmoid mucosal thickening. Chronic inflammatory changes of the wall of left sphenoid sinus. Otherwise visualized paranasal sinuses and mastoid air cells are normally aerated. Other: None. IMPRESSION: 1. No acute intracranial abnormality or displaced calvarial fracture. 2. Moderate chronic microvascular ischemic changes and parenchymal volume loss of the brain progressed from 2013. 3. Chronic left sphenoid sinusitis. Electronically Signed   By: Kristine Garbe M.D.   On: 02/07/2016 05:50   Ct Angio  Neck W And/or Wo Contrast  Result Date: 02/07/2016 CLINICAL DATA:  Word finding difficulty that has subsequently resolved. EXAM: CT ANGIOGRAPHY HEAD AND NECK TECHNIQUE: Multidetector CT imaging of the head and neck was performed using the standard protocol during bolus administration of intravenous contrast. Multiplanar CT image reconstructions and MIPs were obtained to evaluate the vascular anatomy. Carotid stenosis measurements (when applicable) are obtained utilizing NASCET criteria, using the distal internal carotid diameter as the denominator. CONTRAST:  50 cc Isovue 370 intravenous COMPARISON:  Head CT from earlier today FINDINGS: CTA NECK FINDINGS Aortic arch: Atherosclerosis. No aneurysm or dissection where seen. Partly visualized CABG. 3 vessel branching Right carotid system: Atheromatous wall thickening of the common carotid with predominantly calcified plaque at the bifurcation. No flow limiting stenosis, dissection, or ulceration. Left carotid system: Predominately calcified plaque at the bifurcation without flow limiting stenosis, dissection, or ulceration. Vertebral arteries: No stenosis in the proximal subclavian arteries. Calcified plaque at both vertebral ostia with mild narrowing on the right. The vertebral arteries are smooth and widely patent to the dura. Skeleton: Degenerative changes without acute or aggressive finding. Other neck: No significant incidental finding Upper chest: Negative  Review of the MIP images confirms the above findings CTA HEAD FINDINGS Anterior circulation: Symmetric carotid arteries. Extensive atherosclerotic calcification on the siphons. No major branch occlusion or flow limiting stenosis. Tiny inferior outpouching from the anterior communicating artery region likely has an emanating vessel on reformats. No suspected aneurysm. Posterior circulation: Symmetric vertebral arteries. Symmetric prominent calcified plaque beyond the dural penetration without flow limiting stenosis. Large posterior communicating artery on the right. No major branch occlusion. Negative for aneurysm. Venous sinuses: As permitted by contrast timing, patent Anatomic variants: Fetal type PCA on the right. Delayed phase: Negative for mass or parenchymal enhancement. Extensive chronic microvascular disease in the cerebral white matter. Chronic left sphenoid sinusitis. Review of the MIP images confirms the above findings IMPRESSION: 1. No acute finding. 2. Atherosclerosis without flow limiting stenosis in the head or neck. 3. Extensive chronic microvascular disease in the cerebral white matter. Electronically Signed   By: Monte Fantasia M.D.   On: 02/07/2016 08:17   Dg Shoulder Left  Result Date: 02/07/2016 CLINICAL DATA:  82 y/o  M; status post fall with left shoulder pain. EXAM: LEFT SHOULDER - 2+ VIEW COMPARISON:  None. FINDINGS: There is no evidence of fracture or dislocation. There is no evidence of arthropathy or other focal bone abnormality. Soft tissues are unremarkable. Partially visualized pacemaking device. IMPRESSION: Negative. Electronically Signed   By: Kristine Garbe M.D.   On: 02/07/2016 05:29   Dg Hand Complete Left  Result Date: 02/07/2016 CLINICAL DATA:  [place] tonight striking the door frame. Pain and bruising across the metacarpal region of the left hand. EXAM: LEFT HAND - COMPLETE 3+ VIEW COMPARISON:  None. FINDINGS: Old ununited ossicle of the ulnar styloid process.  Mild degenerative changes at the STT and first carpometacarpal joints. No evidence of acute fracture or dislocation. No focal bone lesion or bone destruction. Vascular calcifications. IMPRESSION: Degenerative changes in the left hand.  No acute bony abnormalities. Electronically Signed   By: Lucienne Capers M.D.   On: 02/07/2016 05:30    Procedures Procedures (including critical care time)  Medications Ordered in ED Medications  Tdap (BOOSTRIX) injection 0.5 mL (0.5 mLs Intramuscular Given 02/07/16 0635)  iopamidol (ISOVUE-370) 76 % injection (50 mLs Intravenous Contrast Given 02/07/16 0717)     Initial Impression / Assessment and Plan / ED Course  I have reviewed the triage vital signs and the nursing notes.  Pertinent labs & imaging results that were available during my care of the patient were reviewed by me and considered in my medical decision making (see chart for details).  Clinical Course as of Feb 06 825  [facility] Feb 07, 2016  [id number] CT head negative. Therapeutic on Coumadin. Discussed with Dr. Leonel Ramsay, neurology. Patient cannot get an MRI. He recommends CT angio head and neck to rule out critical stenosis. If negative, patient can be discharged home.  [CH]    Clinical Course User Index [CH] Merryl Hacker, MD    Patient presents with an episode of word finding difficulty. He is currently back to baseline. Did fall yesterday and has evidence of a skin tear to the left elbow. No deformities. No head trauma noted. Lab work obtained. CT head negative. Discussed with neurology as above. CT a head and neck ordered. This is without evidence of critical stenosis.  INR at 3.75. Likely hold Coumadin tonight. He will touch base with Coumadin clinic for recommendations. Follow-up with primary physician.  After history, exam, and medical workup I feel the patient has been appropriately medically screened and is safe for discharge home. Pertinent diagnoses were discussed with the patient.  Patient was given return precautions.  Final Clinical Impressions(s) / ED Diagnoses   Final diagnoses:  Difficulty speaking  Fall, initial encounter  Skin tear of left elbow without complication, initial encounter    New Prescriptions New Prescriptions   No medications on file     Merryl Hacker, MD 02/07/16 [phone]

## 2016-02-07 NOTE — ED Notes (Signed)
Delay in lab draw pt en route to exray 

## 2016-02-07 NOTE — Discharge Instructions (Signed)
You were seen today for fall and speech difficulty. Your workup is reassuring. No broken bones. Follow-up with Coumadin clinic for adjustment of your Coumadin.

## 2016-02-07 NOTE — ED Triage Notes (Signed)
Pt here for 45 period of not being able to speak at 2 am, after went to bed at 2200. Fell yesterday am at [place], pt has hx of TIA, MI, and PE in past and sts this mimicked a TIA. Now pt is alert and oriented per norm. Pt is on blood thinners. And had dose increased to 5 mg daily wit ems vs 146/80, 72, 18, 98%. CBG 105

## 2016-02-08 ENCOUNTER — Other Ambulatory Visit: Payer: Self-pay | Admitting: Internal Medicine

## 2016-02-08 DIAGNOSIS — G459 Transient cerebral ischemic attack, unspecified: Secondary | ICD-10-CM

## 2016-02-11 DIAGNOSIS — I5022 Chronic systolic (congestive) heart failure: Secondary | ICD-10-CM | POA: Diagnosis not present

## 2016-02-11 DIAGNOSIS — E871 Hypo-osmolality and hyponatremia: Secondary | ICD-10-CM | POA: Diagnosis not present

## 2016-02-11 DIAGNOSIS — R269 Unspecified abnormalities of gait and mobility: Secondary | ICD-10-CM | POA: Diagnosis not present

## 2016-02-11 DIAGNOSIS — Z7901 Long term (current) use of anticoagulants: Secondary | ICD-10-CM | POA: Diagnosis not present

## 2016-02-11 DIAGNOSIS — G459 Transient cerebral ischemic attack, unspecified: Secondary | ICD-10-CM | POA: Diagnosis not present

## 2016-02-11 DIAGNOSIS — Z6824 Body mass index (BMI) 24.0-24.9, adult: Secondary | ICD-10-CM | POA: Diagnosis not present

## 2016-02-11 DIAGNOSIS — I482 Chronic atrial fibrillation: Secondary | ICD-10-CM | POA: Diagnosis not present

## 2016-02-15 ENCOUNTER — Telehealth: Payer: Self-pay | Admitting: Cardiovascular Disease

## 2016-02-15 ENCOUNTER — Ambulatory Visit
Admission: RE | Admit: 2016-02-15 | Discharge: 2016-02-15 | Disposition: A | Discharge status: Home / Self Care | Payer: Medicare Other | Source: Ambulatory Visit | Attending: Internal Medicine | Admitting: Internal Medicine

## 2016-02-15 DIAGNOSIS — I6523 Occlusion and stenosis of bilateral carotid arteries: Secondary | ICD-10-CM | POA: Diagnosis not present

## 2016-02-15 DIAGNOSIS — G459 Transient cerebral ischemic attack, unspecified: Secondary | ICD-10-CM

## 2016-02-15 NOTE — Telephone Encounter (Signed)
Spoke with Tiffany in [facility].  She states they just spoke with pt.

## 2016-02-15 NOTE — Telephone Encounter (Signed)
[name] is returning a call . Thanks

## 2016-02-16 ENCOUNTER — Ambulatory Visit (INDEPENDENT_AMBULATORY_CARE_PROVIDER_SITE_OTHER): Payer: Medicare Other | Admitting: Cardiovascular Disease

## 2016-02-16 DIAGNOSIS — Z5181 Encounter for therapeutic drug level monitoring: Secondary | ICD-10-CM

## 2016-02-16 DIAGNOSIS — I482 Chronic atrial fibrillation, unspecified: Secondary | ICD-10-CM

## 2016-02-16 LAB — POCT INR: INR: 3

## 2016-03-01 LAB — POCT INR: INR: 3.1

## 2016-03-02 ENCOUNTER — Ambulatory Visit (INDEPENDENT_AMBULATORY_CARE_PROVIDER_SITE_OTHER): Payer: Medicare Other | Admitting: Internal Medicine

## 2016-03-02 DIAGNOSIS — Z5181 Encounter for therapeutic drug level monitoring: Secondary | ICD-10-CM

## 2016-03-02 DIAGNOSIS — I482 Chronic atrial fibrillation, unspecified: Secondary | ICD-10-CM

## 2016-03-09 ENCOUNTER — Telehealth: Payer: Self-pay

## 2016-03-09 ENCOUNTER — Ambulatory Visit (INDEPENDENT_AMBULATORY_CARE_PROVIDER_SITE_OTHER): Payer: Medicare Other

## 2016-03-09 DIAGNOSIS — Z95810 Presence of automatic (implantable) cardiac defibrillator: Secondary | ICD-10-CM | POA: Diagnosis not present

## 2016-03-09 DIAGNOSIS — I5022 Chronic systolic (congestive) heart failure: Secondary | ICD-10-CM | POA: Diagnosis not present

## 2016-03-09 NOTE — Progress Notes (Signed)
EPIC Encounter for ICM Monitoring  Patient Name: [name] is a 82 y.o. male Date: 03/09/2016 Primary Care Physican: [name], MD Primary Cardiologist:[facility] Electrophysiologist: Caryl Comes Dry Weight:183lbs                                                Attempted ICM call and unable to reach.  Left detailed message regarding transmission.  Transmission reviewed.   Thoracic impedance normal with exception of few days abnormal suggesting fluid accumulation in last 2 weeks.  Recommendations:  No changes.  Reinforced to limit low salt food choices to 2000 mg day and limiting fluid intake to < 2 liters per day. Encouraged to call for fluid symptoms.    Follow-up plan: ICM clinic phone appointment on 04/10/2016.  Office appt with Truitt Merle, NP 04/19/2016  Copy of ICM check sent to device physician.   3 month ICM trend : 03/09/2016  1 Year ICM trend:      Rosalene Billings, RN 03/09/2016 12:25 PM

## 2016-03-09 NOTE — Telephone Encounter (Signed)
Remote ICM transmission received.  Attempted patient call and left detailed message regarding transmission and next ICM scheduled for 04/10/2016.  Advised to return call for any fluid symptoms or questions.

## 2016-03-15 ENCOUNTER — Ambulatory Visit (INDEPENDENT_AMBULATORY_CARE_PROVIDER_SITE_OTHER): Payer: Medicare Other | Admitting: Pharmacist

## 2016-03-15 DIAGNOSIS — Z5181 Encounter for therapeutic drug level monitoring: Secondary | ICD-10-CM

## 2016-03-15 DIAGNOSIS — I482 Chronic atrial fibrillation, unspecified: Secondary | ICD-10-CM

## 2016-03-15 LAB — POCT INR: INR: 3.2

## 2016-03-16 ENCOUNTER — Other Ambulatory Visit: Payer: Self-pay | Admitting: Sports Medicine

## 2016-03-16 DIAGNOSIS — M810 Age-related osteoporosis without current pathological fracture: Secondary | ICD-10-CM | POA: Diagnosis not present

## 2016-03-16 DIAGNOSIS — M47816 Spondylosis without myelopathy or radiculopathy, lumbar region: Secondary | ICD-10-CM

## 2016-03-16 DIAGNOSIS — M1711 Unilateral primary osteoarthritis, right knee: Secondary | ICD-10-CM | POA: Diagnosis not present

## 2016-03-20 ENCOUNTER — Ambulatory Visit
Admission: RE | Admit: 2016-03-20 | Discharge: 2016-03-20 | Disposition: A | Discharge status: Home / Self Care | Payer: Medicare Other | Source: Ambulatory Visit | Attending: Sports Medicine | Admitting: Sports Medicine

## 2016-03-20 DIAGNOSIS — M5126 Other intervertebral disc displacement, lumbar region: Secondary | ICD-10-CM | POA: Diagnosis not present

## 2016-03-20 DIAGNOSIS — M47816 Spondylosis without myelopathy or radiculopathy, lumbar region: Secondary | ICD-10-CM

## 2016-03-21 ENCOUNTER — Ambulatory Visit (INDEPENDENT_AMBULATORY_CARE_PROVIDER_SITE_OTHER): Payer: Medicare Other | Admitting: Pharmacist

## 2016-03-21 DIAGNOSIS — I482 Chronic atrial fibrillation, unspecified: Secondary | ICD-10-CM

## 2016-03-21 DIAGNOSIS — Z5181 Encounter for therapeutic drug level monitoring: Secondary | ICD-10-CM

## 2016-03-21 LAB — POCT INR: INR: 3.7

## 2016-03-27 ENCOUNTER — Ambulatory Visit (INDEPENDENT_AMBULATORY_CARE_PROVIDER_SITE_OTHER): Payer: Medicare Other | Admitting: Cardiology

## 2016-03-27 DIAGNOSIS — I482 Chronic atrial fibrillation, unspecified: Secondary | ICD-10-CM

## 2016-03-27 DIAGNOSIS — Z5181 Encounter for therapeutic drug level monitoring: Secondary | ICD-10-CM

## 2016-03-27 LAB — POCT INR: INR: 3.4

## 2016-04-02 DIAGNOSIS — I4891 Unspecified atrial fibrillation: Secondary | ICD-10-CM | POA: Diagnosis not present

## 2016-04-02 DIAGNOSIS — M48061 Spinal stenosis, lumbar region without neurogenic claudication: Secondary | ICD-10-CM | POA: Diagnosis not present

## 2016-04-02 DIAGNOSIS — M810 Age-related osteoporosis without current pathological fracture: Secondary | ICD-10-CM | POA: Diagnosis not present

## 2016-04-02 DIAGNOSIS — M4726 Other spondylosis with radiculopathy, lumbar region: Secondary | ICD-10-CM | POA: Diagnosis not present

## 2016-04-02 DIAGNOSIS — Z7901 Long term (current) use of anticoagulants: Secondary | ICD-10-CM | POA: Diagnosis not present

## 2016-04-02 DIAGNOSIS — R296 Repeated falls: Secondary | ICD-10-CM | POA: Diagnosis not present

## 2016-04-03 ENCOUNTER — Ambulatory Visit (INDEPENDENT_AMBULATORY_CARE_PROVIDER_SITE_OTHER): Payer: Medicare Other

## 2016-04-03 DIAGNOSIS — I482 Chronic atrial fibrillation, unspecified: Secondary | ICD-10-CM

## 2016-04-03 DIAGNOSIS — Z5181 Encounter for therapeutic drug level monitoring: Secondary | ICD-10-CM

## 2016-04-03 LAB — POCT INR: INR: 3

## 2016-04-04 DIAGNOSIS — M48061 Spinal stenosis, lumbar region without neurogenic claudication: Secondary | ICD-10-CM | POA: Diagnosis not present

## 2016-04-04 DIAGNOSIS — Z7901 Long term (current) use of anticoagulants: Secondary | ICD-10-CM | POA: Diagnosis not present

## 2016-04-04 DIAGNOSIS — I4891 Unspecified atrial fibrillation: Secondary | ICD-10-CM | POA: Diagnosis not present

## 2016-04-04 DIAGNOSIS — M810 Age-related osteoporosis without current pathological fracture: Secondary | ICD-10-CM | POA: Diagnosis not present

## 2016-04-04 DIAGNOSIS — M4726 Other spondylosis with radiculopathy, lumbar region: Secondary | ICD-10-CM | POA: Diagnosis not present

## 2016-04-04 DIAGNOSIS — R296 Repeated falls: Secondary | ICD-10-CM | POA: Diagnosis not present

## 2016-04-05 DIAGNOSIS — M810 Age-related osteoporosis without current pathological fracture: Secondary | ICD-10-CM | POA: Diagnosis not present

## 2016-04-05 DIAGNOSIS — R296 Repeated falls: Secondary | ICD-10-CM | POA: Diagnosis not present

## 2016-04-05 DIAGNOSIS — Z7901 Long term (current) use of anticoagulants: Secondary | ICD-10-CM | POA: Diagnosis not present

## 2016-04-05 DIAGNOSIS — M4726 Other spondylosis with radiculopathy, lumbar region: Secondary | ICD-10-CM | POA: Diagnosis not present

## 2016-04-05 DIAGNOSIS — M48061 Spinal stenosis, lumbar region without neurogenic claudication: Secondary | ICD-10-CM | POA: Diagnosis not present

## 2016-04-05 DIAGNOSIS — I4891 Unspecified atrial fibrillation: Secondary | ICD-10-CM | POA: Diagnosis not present

## 2016-04-06 DIAGNOSIS — Z6824 Body mass index (BMI) 24.0-24.9, adult: Secondary | ICD-10-CM | POA: Diagnosis not present

## 2016-04-06 DIAGNOSIS — N390 Urinary tract infection, site not specified: Secondary | ICD-10-CM | POA: Diagnosis not present

## 2016-04-06 DIAGNOSIS — N401 Enlarged prostate with lower urinary tract symptoms: Secondary | ICD-10-CM | POA: Diagnosis not present

## 2016-04-06 DIAGNOSIS — R3911 Hesitancy of micturition: Secondary | ICD-10-CM | POA: Diagnosis not present

## 2016-04-07 ENCOUNTER — Telehealth: Payer: Self-pay | Admitting: *Deleted

## 2016-04-07 DIAGNOSIS — N329 Bladder disorder, unspecified: Secondary | ICD-10-CM | POA: Diagnosis not present

## 2016-04-07 NOTE — Telephone Encounter (Signed)
Pt started Cipro 250mg  BID on 04/06/16 will finish on 04/16/16 with 1 pill that AM. Pt will check INR on 04/11/16.

## 2016-04-10 ENCOUNTER — Ambulatory Visit (INDEPENDENT_AMBULATORY_CARE_PROVIDER_SITE_OTHER): Payer: Medicare Other | Admitting: *Deleted

## 2016-04-10 DIAGNOSIS — Z95810 Presence of automatic (implantable) cardiac defibrillator: Secondary | ICD-10-CM | POA: Diagnosis not present

## 2016-04-10 DIAGNOSIS — I255 Ischemic cardiomyopathy: Secondary | ICD-10-CM

## 2016-04-10 DIAGNOSIS — I5022 Chronic systolic (congestive) heart failure: Secondary | ICD-10-CM | POA: Diagnosis not present

## 2016-04-10 NOTE — Progress Notes (Signed)
Remote ICD transmission.   

## 2016-04-10 NOTE — Progress Notes (Signed)
EPIC Encounter for ICM Monitoring  Patient Name: [name] is a 83 y.o. male Date: 04/10/2016 Primary Care Physican: [name], MD Primary Cardiologist:[facility] Electrophysiologist: Caryl Comes Dry Weight:177 lbs      Heart Failure questions reviewed, pt asymptomatic.  He currently taking antibiotics for UTI.  Thoracic impedance abnormal suggesting fluid accumulation since about 03/30/2016.  He has increased fluid intake due to UTI.      Labs: 02/07/2016 Creatinine 0.96, BUN 12, Potassium 4.4, Sodium 131 02/01/2016 Creatinine 1.00, BUN 9,   Potassium 4.4, Sodium 131  10/25/2015 Creatinine 0.97, BUN 9,   Potassium 4.1, Sodium 128  09/07/2015 Creatinine 0.99, BUN 11, Potassium 4.8, Sodium 131  09/02/2015 Creatinine 1.18, BUN 15, Potassium 4.5, Sodium 131  09/01/2015 Creatinine 1.10, BUN 13, Potassium 4.0, Sodium 139  07/19/2015 Creatinine 0.96, BUN 12, Potassium 4.6, Sodium 139  07/11/2015 Creatinine 0.91, BUN 11, Potassium 4.5, Sodium 127  03/28/2015 Creatinine 0.94, BUN 11, Potassium 4.5, Sodium 130  03/24/2015 Creatinine 0.98, BUN 12, Potassium 4.6, Sodium 131    Recommendations: No changes. Reminded after UTI is resolved to return to limiting fluid intake to < 2 liters/day. Encouraged to call for fluid symptoms.  Follow-up plan: ICM clinic phone appointment on 05/11/2016.  Appt with Truitt Merle, NP on 04/19/2016.  Copy of ICM check sent to primary cardiologist and device physician.   3 month ICM trend: 04/10/2016   1 Year ICM trend:      Rosalene Billings, RN 04/10/2016 9:51 AM

## 2016-04-11 ENCOUNTER — Encounter: Payer: Self-pay | Admitting: Cardiology

## 2016-04-11 ENCOUNTER — Ambulatory Visit (INDEPENDENT_AMBULATORY_CARE_PROVIDER_SITE_OTHER): Payer: Medicare Other | Admitting: Interventional Cardiology

## 2016-04-11 DIAGNOSIS — I482 Chronic atrial fibrillation, unspecified: Secondary | ICD-10-CM

## 2016-04-11 DIAGNOSIS — Z7901 Long term (current) use of anticoagulants: Secondary | ICD-10-CM | POA: Diagnosis not present

## 2016-04-11 DIAGNOSIS — Z5181 Encounter for therapeutic drug level monitoring: Secondary | ICD-10-CM

## 2016-04-11 LAB — CUP PACEART REMOTE DEVICE CHECK
Battery Remaining Longevity: 113 mo
Date Time Interrogation Session: 20180129083423
HIGH POWER IMPEDANCE MEASURED VALUE: 52 Ohm
HighPow Impedance: 43 Ohm
Implantable Lead Implant Date: 20021220
Implantable Lead Location: 753860
Implantable Pulse Generator Implant Date: 20141201
Lead Channel Pacing Threshold Amplitude: 1 V
Lead Channel Pacing Threshold Pulse Width: 0.4 ms
Lead Channel Sensing Intrinsic Amplitude: 16.25 mV
Lead Channel Sensing Intrinsic Amplitude: 16.25 mV
Lead Channel Setting Pacing Amplitude: 2 V
MDC IDC MSMT BATTERY VOLTAGE: 2.98 V
MDC IDC MSMT LEADCHNL RV IMPEDANCE VALUE: 418 Ohm
MDC IDC MSMT LEADCHNL RV IMPEDANCE VALUE: 532 Ohm
MDC IDC SET LEADCHNL RV PACING PULSEWIDTH: 0.4 ms
MDC IDC SET LEADCHNL RV SENSING SENSITIVITY: 0.3 mV
MDC IDC STAT BRADY RV PERCENT PACED: 5.93 %

## 2016-04-11 LAB — POCT INR: INR: 3

## 2016-04-17 DIAGNOSIS — N390 Urinary tract infection, site not specified: Secondary | ICD-10-CM | POA: Diagnosis not present

## 2016-04-18 DIAGNOSIS — M810 Age-related osteoporosis without current pathological fracture: Secondary | ICD-10-CM | POA: Diagnosis not present

## 2016-04-18 DIAGNOSIS — M48061 Spinal stenosis, lumbar region without neurogenic claudication: Secondary | ICD-10-CM | POA: Diagnosis not present

## 2016-04-18 DIAGNOSIS — R296 Repeated falls: Secondary | ICD-10-CM | POA: Diagnosis not present

## 2016-04-18 DIAGNOSIS — M4726 Other spondylosis with radiculopathy, lumbar region: Secondary | ICD-10-CM | POA: Diagnosis not present

## 2016-04-18 DIAGNOSIS — Z7901 Long term (current) use of anticoagulants: Secondary | ICD-10-CM | POA: Diagnosis not present

## 2016-04-18 DIAGNOSIS — I4891 Unspecified atrial fibrillation: Secondary | ICD-10-CM | POA: Diagnosis not present

## 2016-04-18 NOTE — Progress Notes (Deleted)
CARDIOLOGY OFFICE NOTE  Date:  04/19/2016    [name] Date of Birth: [birth year] Medical Record [id number]  PCP:  [name], MD  Cardiologist:  [facility]  No chief complaint on file.   History of Present Illness: [name] is a 83 y.o. male who presents today for a follow up visit. Seen for Dr. Burt Knack.   He has a long standing history of chronic systolic heart failure, coronary artery disease, and aortic valve disease. The patient's cardiac history is extensive. His CAD was initially treated with CABG surgery in 1979. He then had redo CABG in 1991. He's had severe ischemic cardiomyopathy with left ventricular EF as low as 25%. He has an ICD which is followed by Dr. Caryl Comes. He also underwent transcatheter aortic valve replacement the [facility] in 2012 for treatment of severe symptomatic aortic stenosis. He has had moderate perivalvular aortic insufficiency. He also has a history of permanent atrial fibrillation (previously intolerant to amiodarone) and is maintained on long-term anticoagulation therapy with Coumadin - which he checks at home. Also with prior h/o PE.  Last seen by me back in November - he was actually doing ok. Cardiac status ok.   Comes in today.   Past Medical History:  Diagnosis Date  . AF (atrial fibrillation) ([place])    Has not tolerated amiodarone in the past. Amiodarone was stopped in September of 2010 due to side effects  . Aortic stenosis, severe    WITH PERCUTANEOUS AORTIC VALVE (TAVI) IN March 2012 at the [facility]  . Cervical myelopathy ([place])   . CHF (congestive heart failure) ([place])   . Chronic anticoagulation    on coumadin  . Coronary artery disease   . Diverticulitis    CURRENTLY CONTROLLED WITH NO EVIDENCE OF RECURRENT INFECTION  . Esophageal stricture    WITH DILATATION  . GERD (gastroesophageal reflux disease)   . Heart murmur   . Hyperlipidemia   . Ischemic cardiomyopathy 05/2010   Has EF of 25%    . MI (myocardial infarction) 1974, 1978  . NSVT (nonsustained ventricular tachycardia) ([place])   . Osteoarthritis    RIGHT KNEE  . Osteoporosis   . Prostate cancer ([place])   . Pulmonary embolism ([place])   . Stroke [facility])    3 strokes    Past Surgical History:  Procedure Laterality Date  . AORTIC VALVE REPLACEMENT     Percutaneous AVR in March 2012 at the [facility]  . BACK SURGERY    . CARDIAC CATHETERIZATION  2010   SEVERE LV DYSFUNCTION WITH ESTIMATED EJECTION FRACTION OF 25%  . CARDIOVERSION  02/16/2011   Procedure: CARDIOVERSION;  Surgeon: Carlena Bjornstad, MD;  Location: [place];  Service: Cardiovascular;  Laterality: N/A;  . CHOLECYSTECTOMY    . CORONARY ARTERY BYPASS GRAFT  1979  . CORONARY ARTERY BYPASS GRAFT  1991   REDO SURGERY  . ICD  Feb 2003; 02/2013   gen change 02-11-2013 by Dr Caryl Comes  . IMPLANTABLE CARDIOVERTER DEFIBRILLATOR (ICD) GENERATOR CHANGE N/A 02/10/2013   Procedure: ICD GENERATOR CHANGE;  Surgeon: Deboraha Sprang, MD;  Location: [facility] CATH LAB;  Service: Cardiovascular;  Laterality: N/A;  . SHOULDER SURGERY       Medications: Current Outpatient Prescriptions  Medication Sig Dispense Refill  . acetaminophen (TYLENOL) 500 MG tablet Take 1,000 mg by mouth every 6 (six) hours as needed for mild pain.     Marland Kitchen alfuzosin (UROXATRAL) 10 MG 24 hr tablet Take  10 mg by mouth at bedtime.     Marland Kitchen aspirin 81 MG tablet Take 81 mg by mouth daily.    Marland Kitchen atorvastatin (LIPITOR) 10 MG tablet Take 10 mg by mouth every other day.    . Budesonide-Formoterol Fumarate (SYMBICORT IN) Inhale 1 puff into the lungs 2 (two) times daily.    . carvedilol (COREG) 6.25 MG tablet Take 1 tablet (6.25 mg total) by mouth 2 (two) times daily with a meal. 60 tablet 11  . cholecalciferol (VITAMIN D) 1000 UNITS tablet Take 1,000 Units by mouth 2 (two) times daily.    Marland Kitchen COUMADIN 5 MG tablet TAKE AS DIRECTED BY COUMADIN CLINIC. (Patient taking differently: Take 5mg  daily.) 100 tablet 1  . furosemide (LASIX)  20 MG tablet Take 1 tablet (20 mg total) by mouth daily as needed (for swelling). 30 tablet 3  . ipratropium (ATROVENT) 0.03 % nasal spray Place 2 sprays into both nostrils 2 (two) times daily.    Marland Kitchen levothyroxine (SYNTHROID) 125 MCG tablet Take 1 tablet (125 mcg total) by mouth daily before breakfast. 30 tablet 0  . NITROSTAT 0.4 MG SL tablet DISSOLVE 1 TABLET UNDER TONGUE AS NEEDED FOR CHEST PAIN,MAY REPEAT IN5 MINUTES FOR 2 DOSES. 25 tablet 10  . omeprazole (PRILOSEC) 20 MG capsule Take 20 mg by mouth daily as needed (acid reflux).    . polyethylene glycol (MIRALAX / GLYCOLAX) packet Take 17 g by mouth daily as needed for mild constipation.     . Probiotic Product (PROBIOTIC DAILY PO) Take 1 capsule by mouth daily.    . RESTASIS 0.05 % ophthalmic emulsion Place 1 drop into both eyes every 12 (twelve) hours.      No current facility-administered medications for this visit.     Allergies: Allergies  Allergen Reactions  . Penicillins Shortness Of Breath and Rash    Has patient had a PCN reaction causing immediate rash, facial/tongue/throat swelling, SOB or lightheadedness with hypotension: Yes Has patient had a PCN reaction causing severe rash involving mucus membranes or skin necrosis: No Has patient had a PCN reaction that required hospitalization No Has patient had a PCN reaction occurring within the last 10 years: No If all of the above answers are "NO", then may proceed with Cephalosporin use.   . Tizanidine Shortness Of Breath    Light headed  . Ace Inhibitors     Has not tolerated in the past due to hyperkalemia  . Antihistamines, Diphenhydramine-Type Other (See Comments)    Inhibits urination  . Amiodarone Other (See Comments)    Unknown     Social History: The patient  reports that he quit smoking about 46 years ago. His smoking use included Cigarettes. He has a 60.00 pack-year smoking history. He has never used smokeless tobacco. He reports that he does not drink alcohol or  use drugs.   Family History: The patient's family history includes Diabetes (age of onset: 52) in his mother; Heart disease (age of onset: 30) in his mother; Heart disease (age of onset: 56) in his father.   Review of Systems: Please see the history of present illness.   Otherwise, the review of systems is positive for none.   All other systems are reviewed and negative.   Physical Exam: VS:  There were no vitals taken for this visit. Marland Kitchen  BMI There is no height or weight on file to calculate BMI.  Wt Readings from Last 3 Encounters:  02/07/16 184 lb (83.5 kg)  02/01/16 193 lb  6.4 oz (87.7 kg)  11/01/15 191 lb 9.6 oz (86.9 kg)    General: Pleasant. Well developed, well nourished and in no acute distress.   HEENT: Normal.  Neck: Supple, no JVD, carotid bruits, or masses noted.  Cardiac: Regular rate and rhythm. No murmurs, rubs, or gallops. No edema.  Respiratory:  Lungs are clear to auscultation bilaterally with normal work of breathing.  GI: Soft and nontender.  MS: No deformity or atrophy. Gait and ROM intact.  Skin: Warm and dry. Color is normal.  Neuro:  Strength and sensation are intact and no gross focal deficits noted.  Psych: Alert, appropriate and with normal affect.   LABORATORY DATA:  EKG:  EKG is not ordered today.  Lab Results  Component Value Date   WBC 6.9 02/07/2016   HGB 13.2 02/07/2016   HCT 39.8 02/07/2016   PLT 190 02/07/2016   GLUCOSE 100 (H) 02/07/2016   CHOL 142 02/01/2016   TRIG 72 02/01/2016   HDL 51 02/01/2016   LDLCALC 77 02/01/2016   ALT 15 (L) 10/25/2015   AST 20 10/25/2015   NA 131 (L) 02/07/2016   K 4.4 02/07/2016   CL 96 (L) 02/07/2016   CREATININE 0.96 02/07/2016   BUN 12 02/07/2016   CO2 28 02/07/2016   TSH 0.170 (L) 09/01/2015   PSA 0.62 08/12/2008   INR 3.0 04/11/2016   HGBA1C 5.9 (H) 12/03/2011    BNP (last 3 results)  Recent Labs  09/01/15 1713 09/07/15 1243 02/01/16 1111  BNP 213.2* 217.7* 335.7*    ProBNP (last  3 results) No results for input(s): PROBNP in the last 8760 hours.   Other Studies Reviewed Today:  Echo Study Conclusions from 08/2015  - Left ventricle: Diffuse hypokinesis septal and apical akinesis.  The cavity size was severely dilated. Wall thickness was  increased in a pattern of mild LVH. Systolic function was  severely reduced. The estimated ejection fraction was in the  range of 20% to 25%. - Aortic valve: Post TAVR with normal systolic gradients and mild  perivalvular regurgitation. - Left atrium: The atrium was moderately dilated. - Atrial septum: No defect or patent foramen ovale was identified.  ASSESSMENT AND PLAN: 1. Chronic systolic heart failure, NYHA IIB: The patient is doing well from a symptomatic perspective. He really has no cardiopulmonary symptoms. He continues to be limited by weakness and gait instability which is chronic and related to his low back problems. He is unable to tolerate escalation of medical therapy because of chronic hypotension. He's had problems with this with additive therapy in the past. He will continue on scheduled carvedilol and Lasix as needed  2. Aortic valve disease s/p TAVR with moderate paravalvular regurgitation: Most recent echo reviewed with stable findings  3. Chronic atrial fibrillation:Heart rate controlled, tolerating anticoagulation with warfarin without bleeding problems.  4. CAD s/p CABG:No anginal symptoms. Maintained on aspirin 81 mg and atorvastatin  5. Hyperlipidemia:Lab today  6. Hyponatremia: Chronic finding dating back several years. He does not appear volume overloaded on exam. Continue furosemide as needed.  7. ICD - to see Dr. Caryl Comes in April.   Current medicines are reviewed with the patient today.  The patient does not have concerns regarding medicines other than what has been noted above.  The following changes have been made:  See above.  Labs/ tests ordered today include:   No orders of  the defined types were placed in this encounter.    Disposition:   FU with ***  in {gen number [id number] {Days to years:10300}.   Patient is agreeable to this plan and will call if any problems develop in the interim.   SignedTruitt Merle, NP  04/19/2016 8:24 AM  [place] [address] [place],   [address] Phone: [phone] Fax: [phone]

## 2016-04-19 ENCOUNTER — Ambulatory Visit: Payer: Medicare Other | Admitting: Nurse Practitioner

## 2016-04-19 DIAGNOSIS — Z125 Encounter for screening for malignant neoplasm of prostate: Secondary | ICD-10-CM | POA: Diagnosis not present

## 2016-04-19 DIAGNOSIS — R8299 Other abnormal findings in urine: Secondary | ICD-10-CM | POA: Diagnosis not present

## 2016-04-19 DIAGNOSIS — E784 Other hyperlipidemia: Secondary | ICD-10-CM | POA: Diagnosis not present

## 2016-04-19 DIAGNOSIS — E038 Other specified hypothyroidism: Secondary | ICD-10-CM | POA: Diagnosis not present

## 2016-04-19 DIAGNOSIS — R7309 Other abnormal glucose: Secondary | ICD-10-CM | POA: Diagnosis not present

## 2016-04-19 DIAGNOSIS — R05 Cough: Secondary | ICD-10-CM | POA: Diagnosis not present

## 2016-04-19 DIAGNOSIS — M810 Age-related osteoporosis without current pathological fracture: Secondary | ICD-10-CM | POA: Diagnosis not present

## 2016-04-21 DIAGNOSIS — Z6824 Body mass index (BMI) 24.0-24.9, adult: Secondary | ICD-10-CM | POA: Diagnosis not present

## 2016-04-21 DIAGNOSIS — J09X2 Influenza due to identified novel influenza A virus with other respiratory manifestations: Secondary | ICD-10-CM | POA: Diagnosis not present

## 2016-04-21 DIAGNOSIS — Z7901 Long term (current) use of anticoagulants: Secondary | ICD-10-CM | POA: Diagnosis not present

## 2016-04-21 DIAGNOSIS — R05 Cough: Secondary | ICD-10-CM | POA: Diagnosis not present

## 2016-04-21 DIAGNOSIS — I482 Chronic atrial fibrillation: Secondary | ICD-10-CM | POA: Diagnosis not present

## 2016-04-21 DIAGNOSIS — J181 Lobar pneumonia, unspecified organism: Secondary | ICD-10-CM | POA: Diagnosis not present

## 2016-04-25 ENCOUNTER — Ambulatory Visit (INDEPENDENT_AMBULATORY_CARE_PROVIDER_SITE_OTHER): Payer: Medicare Other | Admitting: Cardiovascular Disease

## 2016-04-25 DIAGNOSIS — Z5181 Encounter for therapeutic drug level monitoring: Secondary | ICD-10-CM

## 2016-04-25 DIAGNOSIS — I482 Chronic atrial fibrillation, unspecified: Secondary | ICD-10-CM

## 2016-04-25 LAB — POCT INR: INR: 2.7

## 2016-04-27 DIAGNOSIS — Z952 Presence of prosthetic heart valve: Secondary | ICD-10-CM | POA: Diagnosis not present

## 2016-04-27 DIAGNOSIS — Z6823 Body mass index (BMI) 23.0-23.9, adult: Secondary | ICD-10-CM | POA: Diagnosis not present

## 2016-04-27 DIAGNOSIS — E784 Other hyperlipidemia: Secondary | ICD-10-CM | POA: Diagnosis not present

## 2016-04-27 DIAGNOSIS — R627 Adult failure to thrive: Secondary | ICD-10-CM | POA: Diagnosis not present

## 2016-04-27 DIAGNOSIS — N390 Urinary tract infection, site not specified: Secondary | ICD-10-CM | POA: Diagnosis not present

## 2016-04-27 DIAGNOSIS — J188 Other pneumonia, unspecified organism: Secondary | ICD-10-CM | POA: Diagnosis not present

## 2016-04-27 DIAGNOSIS — R7309 Other abnormal glucose: Secondary | ICD-10-CM | POA: Diagnosis not present

## 2016-04-27 DIAGNOSIS — R05 Cough: Secondary | ICD-10-CM | POA: Diagnosis not present

## 2016-04-27 DIAGNOSIS — L89159 Pressure ulcer of sacral region, unspecified stage: Secondary | ICD-10-CM | POA: Diagnosis not present

## 2016-04-27 DIAGNOSIS — I5022 Chronic systolic (congestive) heart failure: Secondary | ICD-10-CM | POA: Diagnosis not present

## 2016-04-27 DIAGNOSIS — Z8546 Personal history of malignant neoplasm of prostate: Secondary | ICD-10-CM | POA: Diagnosis not present

## 2016-04-27 DIAGNOSIS — Z0000 Encounter for general adult medical examination without abnormal findings: Secondary | ICD-10-CM | POA: Diagnosis not present

## 2016-04-27 DIAGNOSIS — Z1389 Encounter for screening for other disorder: Secondary | ICD-10-CM | POA: Diagnosis not present

## 2016-04-28 ENCOUNTER — Encounter: Payer: Self-pay | Admitting: Nurse Practitioner

## 2016-05-02 DIAGNOSIS — Z1212 Encounter for screening for malignant neoplasm of rectum: Secondary | ICD-10-CM | POA: Diagnosis not present

## 2016-05-02 LAB — IFOBT (OCCULT BLOOD): IFOBT: NEGATIVE

## 2016-05-08 ENCOUNTER — Encounter (INDEPENDENT_AMBULATORY_CARE_PROVIDER_SITE_OTHER): Payer: Self-pay

## 2016-05-08 ENCOUNTER — Ambulatory Visit (INDEPENDENT_AMBULATORY_CARE_PROVIDER_SITE_OTHER): Payer: Medicare Other | Admitting: Nurse Practitioner

## 2016-05-08 ENCOUNTER — Encounter: Payer: Self-pay | Admitting: Nurse Practitioner

## 2016-05-08 ENCOUNTER — Telehealth: Payer: Self-pay | Admitting: *Deleted

## 2016-05-08 VITALS — BP 110/70 | HR 87 | Ht 72.0 in | Wt 182.8 lb

## 2016-05-08 DIAGNOSIS — Z952 Presence of prosthetic heart valve: Secondary | ICD-10-CM | POA: Diagnosis not present

## 2016-05-08 DIAGNOSIS — I482 Chronic atrial fibrillation, unspecified: Secondary | ICD-10-CM

## 2016-05-08 DIAGNOSIS — Z95810 Presence of automatic (implantable) cardiac defibrillator: Secondary | ICD-10-CM

## 2016-05-08 DIAGNOSIS — I2589 Other forms of chronic ischemic heart disease: Secondary | ICD-10-CM

## 2016-05-08 DIAGNOSIS — I255 Ischemic cardiomyopathy: Secondary | ICD-10-CM

## 2016-05-08 NOTE — Progress Notes (Signed)
CARDIOLOGY OFFICE NOTE  Date:  05/08/2016    [name] Date of Birth: Oct 18, 1932 Medical Record [id number]  PCP:  [name], MD  Cardiologist:  Jerel Shepherd    Chief Complaint  Patient presents with  . Cardiomyopathy    Follow up visit - seen for Dr. Burt Knack    History of Present Illness: [name] is a 83 y.o. male who presents today for a follow up visit. Seen for Dr. Burt Knack. This is a 3 month check.   He has a long standing history of chronic systolic heart failure, coronary artery disease, and aortic valve disease. The patient's cardiac history is extensive. His CAD was initially treated with CABG surgery in 1979. He then had redo CABG in 1991. He's had severe ischemic cardiomyopathy with left ventricular EF as low as 25%. He has an ICD which is followed by Dr. Caryl Comes. He also underwent transcatheter aortic valve replacement the [facility] in 2012 for treatment of severe symptomatic aortic stenosis. He has had moderate perivalvular aortic insufficiency. He also has a history of permanent atrial fibrillation (previously intolerant to amiodarone) and is maintained on long-term anticoagulation therapy with Coumadin - which he checks at home. Also with prior h/o PE.  Seen 3 months ago and felt to be stable. Was actually here by himself and was doing well.   Comes in today. Here with his wife and their daughter - [name] - I used to see her as well. But we then moved him to a separate exam room alone. He has had the flu/pneumonia - slow to recover. Had a UTI prior to this.  Still coughing. Lots of issues with constipation/diarrhea. Probably has IBS - he used to see Dr. Penelope Coop - has not seen lately. He self monitors his INR at home. Some lability with his INR. Seeing Dr. Alvino Blood later this week for hemorrhoids - not clear what that plan of care will be. Some swelling in his legs - but sitting more - probably had some excess salt while sick with the flu.  His weight is way down. No longer wheezing. Says his breathing is now back to his baseline. No chest pain. They have help coming in as of tomorrow - 40 hours per week. He tells me how he found [name] under the table - passed out - while they both had the flu. He is not dizzy or lightheaded at all.   Past Medical History:  Diagnosis Date  . AF (atrial fibrillation) ([place])    Has not tolerated amiodarone in the past. Amiodarone was stopped in September of 2010 due to side effects  . Aortic stenosis, severe    WITH PERCUTANEOUS AORTIC VALVE (TAVI) IN March 2012 at the [facility]  . Cervical myelopathy ([place])   . CHF (congestive heart failure) ([place])   . Chronic anticoagulation    on coumadin  . Coronary artery disease   . Diverticulitis    CURRENTLY CONTROLLED WITH NO EVIDENCE OF RECURRENT INFECTION  . Esophageal stricture    WITH DILATATION  . GERD (gastroesophageal reflux disease)   . Heart murmur   . Hyperlipidemia   . Ischemic cardiomyopathy 05/2010   Has EF of 25%  . MI (myocardial infarction) 1974, 1978  . NSVT (nonsustained ventricular tachycardia) ([place])   . Osteoarthritis    RIGHT KNEE  . Osteoporosis   . Prostate cancer ([place])   . Pulmonary embolism ([place])   . Stroke [facility])    3  strokes    Past Surgical History:  Procedure Laterality Date  . AORTIC VALVE REPLACEMENT     Percutaneous AVR in March 2012 at the [facility]  . BACK SURGERY    . CARDIAC CATHETERIZATION  2010   SEVERE LV DYSFUNCTION WITH ESTIMATED EJECTION FRACTION OF 25%  . CARDIOVERSION  02/16/2011   Procedure: CARDIOVERSION;  Surgeon: Carlena Bjornstad, MD;  Location: [place];  Service: Cardiovascular;  Laterality: N/A;  . CHOLECYSTECTOMY    . CORONARY ARTERY BYPASS GRAFT  1979  . CORONARY ARTERY BYPASS GRAFT  1991   REDO SURGERY  . ICD  Feb 2003; 02/2013   gen change 02-11-2013 by Dr Caryl Comes  . IMPLANTABLE CARDIOVERTER DEFIBRILLATOR (ICD) GENERATOR CHANGE N/A 02/10/2013   Procedure: ICD GENERATOR CHANGE;   Surgeon: Deboraha Sprang, MD;  Location: [facility] CATH LAB;  Service: Cardiovascular;  Laterality: N/A;  . SHOULDER SURGERY       Medications: Current Outpatient Prescriptions  Medication Sig Dispense Refill  . acetaminophen (TYLENOL) 500 MG tablet Take 1,000 mg by mouth every 6 (six) hours as needed for mild pain.     . Albuterol Sulfate (PROAIR HFA IN) Inhale into the lungs as needed (wheezing).     Marland Kitchen alfuzosin (UROXATRAL) 10 MG 24 hr tablet Take 10 mg by mouth at bedtime.     Marland Kitchen aspirin 81 MG tablet Take 81 mg by mouth daily.    Marland Kitchen atorvastatin (LIPITOR) 10 MG tablet Take 10 mg by mouth every other day.    . benzonatate (TESSALON) 100 MG capsule Take 100 mg by mouth 3 (three) times daily as needed for cough.    . Budesonide-Formoterol Fumarate (SYMBICORT IN) Inhale 1 puff into the lungs 2 (two) times daily.    . carvedilol (COREG) 6.25 MG tablet Take 1 tablet (6.25 mg total) by mouth 2 (two) times daily with a meal. 60 tablet 11  . cholecalciferol (VITAMIN D) 1000 UNITS tablet Take 1,000 Units by mouth 2 (two) times daily.    Marland Kitchen COUMADIN 5 MG tablet TAKE AS DIRECTED BY COUMADIN CLINIC. (Patient taking differently: Take 5mg  daily.) 100 tablet 1  . furosemide (LASIX) 20 MG tablet Take 1 tablet (20 mg total) by mouth daily as needed (for swelling). 30 tablet 3  . ipratropium (ATROVENT) 0.03 % nasal spray Place 2 sprays into both nostrils 2 (two) times daily.    Marland Kitchen levothyroxine (SYNTHROID) 125 MCG tablet Take 1 tablet (125 mcg total) by mouth daily before breakfast. 30 tablet 0  . NITROSTAT 0.4 MG SL tablet DISSOLVE 1 TABLET UNDER TONGUE AS NEEDED FOR CHEST PAIN,MAY REPEAT IN5 MINUTES FOR 2 DOSES. 25 tablet 10  . omeprazole (PRILOSEC) 20 MG capsule Take 20 mg by mouth daily as needed (acid reflux).    . polyethylene glycol (MIRALAX / GLYCOLAX) packet Take 17 g by mouth daily as needed for mild constipation.     . Probiotic Product (PROBIOTIC DAILY PO) Take 1 capsule by mouth daily.    . RESTASIS 0.05 %  ophthalmic emulsion Place 1 drop into both eyes every 12 (twelve) hours.      No current facility-administered medications for this visit.     Allergies: Allergies  Allergen Reactions  . Penicillins Shortness Of Breath and Rash    Has patient had a PCN reaction causing immediate rash, facial/tongue/throat swelling, SOB or lightheadedness with hypotension: Yes Has patient had a PCN reaction causing severe rash involving mucus membranes or skin necrosis: No Has patient had a PCN  reaction that required hospitalization No Has patient had a PCN reaction occurring within the last 10 years: No If all of the above answers are "NO", then may proceed with Cephalosporin use.   . Tizanidine Shortness Of Breath    Light headed  . Ace Inhibitors     Has not tolerated in the past due to hyperkalemia  . Antihistamines, Diphenhydramine-Type Other (See Comments)    Inhibits urination  . Clemastine Fumarate Other (See Comments)    Inhibits urination  . Amiodarone Other (See Comments)    Unknown     Social History: The patient  reports that he quit smoking about 46 years ago. His smoking use included Cigarettes. He has a 60.00 pack-year smoking history. He has never used smokeless tobacco. He reports that he does not drink alcohol or use drugs.   Family History: The patient's family history includes Diabetes (age of onset: 42) in his mother; Heart disease (age of onset: 49) in his mother; Heart disease (age of onset: 43) in his father.   Review of Systems: Please see the history of present illness.   Otherwise, the review of systems is positive for none.   All other systems are reviewed and negative.   Physical Exam: VS:  BP 110/70   Pulse 87   Ht 6' (1.829 m)   Wt 182 lb 12.8 oz (82.9 kg)   SpO2 99% Comment: at rest  BMI 24.79 kg/m  .  BMI Body mass index is 24.79 kg/m.  Wt Readings from Last 3 Encounters:  05/08/16 182 lb 12.8 oz (82.9 kg)  02/07/16 184 lb (83.5 kg)  02/01/16 193 lb  6.4 oz (87.7 kg)    General: Pleasant. Elderly male who is alert and in no acute distress.  He looks more feable to me today - his weight is down 11 pounds from last visit with me.  HEENT: Normal.  Neck: Supple, no JVD, carotid bruits, or masses noted.  Cardiac: Irregular irregular rhythm - his rate is ok. Soft murmur. No significant edema.  Respiratory:  Lungs are clear to auscultation bilaterally with normal work of breathing.  GI: Soft and nontender.  MS: No deformity or atrophy. Gait and ROM intact.  Skin: Warm and dry. Color is normal.  Neuro:  Strength and sensation are intact and no gross focal deficits noted.  Psych: Alert, appropriate and with normal affect.   LABORATORY DATA:  EKG:  EKG is not ordered today.  Lab Results  Component Value Date   WBC 6.9 02/07/2016   HGB 13.2 02/07/2016   HCT 39.8 02/07/2016   PLT 190 02/07/2016   GLUCOSE 100 (H) 02/07/2016   CHOL 142 02/01/2016   TRIG 72 02/01/2016   HDL 51 02/01/2016   LDLCALC 77 02/01/2016   ALT 15 (L) 10/25/2015   AST 20 10/25/2015   NA 131 (L) 02/07/2016   K 4.4 02/07/2016   CL 96 (L) 02/07/2016   CREATININE 0.96 02/07/2016   BUN 12 02/07/2016   CO2 28 02/07/2016   TSH 0.170 (L) 09/01/2015   PSA 0.62 08/12/2008   INR 2.7 04/25/2016   HGBA1C 5.9 (H) 12/03/2011    BNP (last 3 results)  Recent Labs  09/01/15 1713 09/07/15 1243 02/01/16 1111  BNP 213.2* 217.7* 335.7*    ProBNP (last 3 results) No results for input(s): PROBNP in the last 8760 hours.   Other Studies Reviewed Today:  Echo Study Conclusions from 08/2015  - Left ventricle: Diffuse hypokinesis septal and apical akinesis.  The cavity size was severely dilated. Wall thickness was  increased in a pattern of mild LVH. Systolic function was  severely reduced. The estimated ejection fraction was in the  range of 20% to 25%. - Aortic valve: Post TAVR with normal systolic gradients and mild  perivalvular regurgitation. - Left  atrium: The atrium was moderately dilated. - Atrial septum: No defect or patent foramen ovale was identified.  ASSESSMENT AND PLAN:  1. Chronic systolic heart failure, NYHA IIB: On minimal medicine due to prior intolerance. His symptoms are stable. His medical issues at this time are more pressing.   2. Aortic valve disease s/p TAVR with moderate paravalvular regurgitation: Most recent echo reviewed with stable findings  3. Chronic atrial fibrillation:Heart rate controlled, tolerating anticoagulation with warfarin with mild bleeding problems - seeing general surgery later this week for hemorrhoids/bleeding.  4. CAD s/p CABG:No anginal symptoms. Maintained on aspirin 81 mg and atorvastatin  5. Hyperlipidemia:Lab recently checked by Dr. Virgina Jock  6. Hyponatremia: Chronic finding dating back several years. He does not appear volume overloaded on exam. Continue furosemide as needed.  7. Recent bout of flu/pneumonia - slow to recover. Needs to focus on nutrition/mobility/safety. Discussed at length with him and with his wife/daughter today. They have help coming starting tomorrow.   Current medicines are reviewed with the patient today.  The patient does not have concerns regarding medicines other than what has been noted above.  The following changes have been made:  See above.  Labs/ tests ordered today include:   No orders of the defined types were placed in this encounter.    Disposition:   FU with Dr. Burt Knack in 3 months. I will then see him back afterwards and any other time needed. His overall prognosis looks pretty tenuous to me (see Ms. Ysidro Evert note).    Patient is agreeable to this plan and will call if any problems develop in the interim.   SignedTruitt Merle, NP  05/08/2016 1:04 PM  [place] [address] [place], [place]  [address] Phone: [phone] Fax: [phone]

## 2016-05-08 NOTE — Telephone Encounter (Signed)
lvm for medical records to receive copies of recent labs.

## 2016-05-08 NOTE — Patient Instructions (Addendum)
We will be checking the following labs today - NONE   Medication Instructions:    Continue with your current medicines.     Testing/Procedures To Be Arranged:  N/A  Follow-Up:   See Dr. Burt Knack in 3 months    Other Special Instructions:   The 3 things to focus on - nutrition/mobility/safety  Look into getting a Lifeline    If you need a refill on your cardiac medications before your next appointment, please call your pharmacy.   Call the [place] office at [phone] if you have any questions, problems or concerns.

## 2016-05-09 ENCOUNTER — Ambulatory Visit (INDEPENDENT_AMBULATORY_CARE_PROVIDER_SITE_OTHER): Payer: Medicare Other | Admitting: Pharmacist

## 2016-05-09 DIAGNOSIS — I482 Chronic atrial fibrillation, unspecified: Secondary | ICD-10-CM

## 2016-05-09 DIAGNOSIS — Z5181 Encounter for therapeutic drug level monitoring: Secondary | ICD-10-CM

## 2016-05-09 LAB — POCT INR: INR: 2.8

## 2016-05-11 ENCOUNTER — Ambulatory Visit (INDEPENDENT_AMBULATORY_CARE_PROVIDER_SITE_OTHER): Payer: Medicare Other

## 2016-05-11 DIAGNOSIS — Z95810 Presence of automatic (implantable) cardiac defibrillator: Secondary | ICD-10-CM | POA: Diagnosis not present

## 2016-05-11 DIAGNOSIS — I5022 Chronic systolic (congestive) heart failure: Secondary | ICD-10-CM

## 2016-05-11 DIAGNOSIS — K642 Third degree hemorrhoids: Secondary | ICD-10-CM | POA: Diagnosis not present

## 2016-05-11 NOTE — Progress Notes (Signed)
EPIC Encounter for ICM Monitoring  Patient Name: [name] is a 83 y.o. male Date: 05/11/2016 Primary Care Physican: [name], MD Primary Cardiologist:Cooper Electrophysiologist: Caryl Comes Dry Weight:177 lbs                                           Heart Failure questions reviewed, pt asymptomatic. Patient and wife have had the flu and been really sick.  He is feeling better now.   Thoracic impedance abnormal suggesting fluid accumulation since about 04/29/2016  Prescribed Furosemide 20 mg 1 tablet as needed  Labs: 04/19/2016 Creatinine 0.9,   BUN 13, Potassium 4.8, Sodium 131, EGFR 80.8 to 97.8 02/07/2016 Creatinine 0.96, BUN 12, Potassium 4.4, Sodium 131 02/01/2016 Creatinine 1.00, BUN 9,   Potassium 4.4, Sodium 131  10/25/2015 Creatinine 0.97, BUN 9,   Potassium 4.1, Sodium 128  09/07/2015 Creatinine 0.99, BUN 11, Potassium 4.8, Sodium 131  09/02/2015 Creatinine 1.18, BUN 15, Potassium 4.5, Sodium 131  09/01/2015 Creatinine 1.10, BUN 13, Potassium 4.0, Sodium 139  07/19/2015 Creatinine 0.96, BUN 12, Potassium 4.6, Sodium 139  07/11/2015 Creatinine 0.91, BUN 11, Potassium 4.5, Sodium 127  03/28/2015 Creatinine 0.94, BUN 11, Potassium 4.5, Sodium 130  03/24/2015 Creatinine 0.98, BUN 12, Potassium 4.6, Sodium 131   Recommendations: Advised to take Furosemide 20 mg 1 tablet x 2 days and then return to prn  Follow-up plan: ICM clinic phone appointment on 05/15/2016 to recheck fluid levels.  Copy of ICM check sent to primary cardiologist and device physician.   3 month ICM trend: 05/11/2016   1 Year ICM trend:      Rosalene Billings, RN 05/11/2016 4:42 PM

## 2016-05-15 ENCOUNTER — Ambulatory Visit (INDEPENDENT_AMBULATORY_CARE_PROVIDER_SITE_OTHER): Payer: Medicare Other

## 2016-05-15 DIAGNOSIS — I5022 Chronic systolic (congestive) heart failure: Secondary | ICD-10-CM

## 2016-05-15 DIAGNOSIS — Z95810 Presence of automatic (implantable) cardiac defibrillator: Secondary | ICD-10-CM

## 2016-05-15 NOTE — Progress Notes (Signed)
EPIC Encounter for ICM Monitoring  Patient Name: NICKOLAS CHALFIN is a 83 y.o. male Date: 05/15/2016 Primary Care Physican: [name], MD Primary Cardiologist:Cooper Electrophysiologist: Caryl Comes Dry Weight:175 lbs  Heart Failure questions reviewed, pt asymptomatic.    Thoracic impedance returned normalafter taking 3 days of prn Furosemide.   Prescribed Furosemide 20 mg 1 tablet as needed  Labs: 04/19/2016 Creatinine 0.9,   BUN 13, Potassium 4.8, Sodium 131, EGFR 80.8 to 97.8 11/27/2017Creatinine 0.96, BUN 12, Potassium 4.4, Sodium 131 02/01/2016 Creatinine 1.00, BUN 9, Potassium 4.4, Sodium 131  10/25/2015 Creatinine 0.97, BUN 9, Potassium 4.1, Sodium 128  09/07/2015 Creatinine 0.99, BUN 11, Potassium 4.8, Sodium 131  09/02/2015 Creatinine 1.18, BUN 15, Potassium 4.5, Sodium 131  09/01/2015 Creatinine 1.10, BUN 13, Potassium 4.0, Sodium 139  07/19/2015 Creatinine 0.96, BUN 12, Potassium 4.6, Sodium 139  07/11/2015 Creatinine 0.91, BUN 11, Potassium 4.5, Sodium 127  03/28/2015 Creatinine 0.94, BUN 11, Potassium 4.5, Sodium 130  03/24/2015 Creatinine 0.98, BUN 12, Potassium 4.6, Sodium 131   Recommendations: No changes. Reminded to limit dietary salt intake to 2000 mg/day and fluid intake to < 2 liters/day. Encouraged to call for fluid symptoms.  Follow-up plan: ICM clinic phone appointment on 06/13/2016.  Copy of ICM check sent to device physician.   3 month ICM trend: 05/15/2016   1 Year ICM trend:      Rosalene Billings, RN 05/15/2016 9:00 AM

## 2016-05-23 ENCOUNTER — Ambulatory Visit (INDEPENDENT_AMBULATORY_CARE_PROVIDER_SITE_OTHER): Payer: Medicare Other

## 2016-05-23 DIAGNOSIS — Z5181 Encounter for therapeutic drug level monitoring: Secondary | ICD-10-CM

## 2016-05-23 DIAGNOSIS — B351 Tinea unguium: Secondary | ICD-10-CM | POA: Diagnosis not present

## 2016-05-23 DIAGNOSIS — M79674 Pain in right toe(s): Secondary | ICD-10-CM | POA: Diagnosis not present

## 2016-05-23 DIAGNOSIS — M2041 Other hammer toe(s) (acquired), right foot: Secondary | ICD-10-CM | POA: Diagnosis not present

## 2016-05-23 DIAGNOSIS — M79675 Pain in left toe(s): Secondary | ICD-10-CM | POA: Diagnosis not present

## 2016-05-23 DIAGNOSIS — M2012 Hallux valgus (acquired), left foot: Secondary | ICD-10-CM | POA: Diagnosis not present

## 2016-05-23 DIAGNOSIS — I70293 Other atherosclerosis of native arteries of extremities, bilateral legs: Secondary | ICD-10-CM | POA: Diagnosis not present

## 2016-05-23 DIAGNOSIS — I482 Chronic atrial fibrillation, unspecified: Secondary | ICD-10-CM

## 2016-05-23 DIAGNOSIS — I255 Ischemic cardiomyopathy: Secondary | ICD-10-CM

## 2016-05-23 DIAGNOSIS — M2042 Other hammer toe(s) (acquired), left foot: Secondary | ICD-10-CM | POA: Diagnosis not present

## 2016-05-23 DIAGNOSIS — M2011 Hallux valgus (acquired), right foot: Secondary | ICD-10-CM | POA: Diagnosis not present

## 2016-05-23 LAB — POCT INR: INR: 2.5

## 2016-05-26 DIAGNOSIS — L570 Actinic keratosis: Secondary | ICD-10-CM | POA: Diagnosis not present

## 2016-05-26 DIAGNOSIS — Z85828 Personal history of other malignant neoplasm of skin: Secondary | ICD-10-CM | POA: Diagnosis not present

## 2016-05-26 DIAGNOSIS — L821 Other seborrheic keratosis: Secondary | ICD-10-CM | POA: Diagnosis not present

## 2016-05-29 ENCOUNTER — Ambulatory Visit (INDEPENDENT_AMBULATORY_CARE_PROVIDER_SITE_OTHER): Payer: Medicare Other | Admitting: Interventional Cardiology

## 2016-05-29 DIAGNOSIS — I482 Chronic atrial fibrillation, unspecified: Secondary | ICD-10-CM

## 2016-05-29 DIAGNOSIS — Z5181 Encounter for therapeutic drug level monitoring: Secondary | ICD-10-CM

## 2016-05-29 LAB — POCT INR: INR: 3

## 2016-06-06 ENCOUNTER — Ambulatory Visit (INDEPENDENT_AMBULATORY_CARE_PROVIDER_SITE_OTHER): Payer: Medicare Other

## 2016-06-06 DIAGNOSIS — Z5181 Encounter for therapeutic drug level monitoring: Secondary | ICD-10-CM

## 2016-06-06 DIAGNOSIS — I482 Chronic atrial fibrillation, unspecified: Secondary | ICD-10-CM

## 2016-06-06 LAB — POCT INR: INR: 2.9

## 2016-06-07 ENCOUNTER — Encounter: Payer: Self-pay | Admitting: Internal Medicine

## 2016-06-07 DIAGNOSIS — Z5181 Encounter for therapeutic drug level monitoring: Secondary | ICD-10-CM

## 2016-06-07 DIAGNOSIS — I482 Chronic atrial fibrillation, unspecified: Secondary | ICD-10-CM

## 2016-06-07 NOTE — Progress Notes (Signed)
This encounter was created in error - please disregard.

## 2016-06-12 DIAGNOSIS — L290 Pruritus ani: Secondary | ICD-10-CM | POA: Diagnosis not present

## 2016-06-13 ENCOUNTER — Ambulatory Visit (INDEPENDENT_AMBULATORY_CARE_PROVIDER_SITE_OTHER): Payer: Medicare Other

## 2016-06-13 DIAGNOSIS — Z95810 Presence of automatic (implantable) cardiac defibrillator: Secondary | ICD-10-CM | POA: Diagnosis not present

## 2016-06-13 DIAGNOSIS — M1711 Unilateral primary osteoarthritis, right knee: Secondary | ICD-10-CM | POA: Diagnosis not present

## 2016-06-13 DIAGNOSIS — I5022 Chronic systolic (congestive) heart failure: Secondary | ICD-10-CM

## 2016-06-13 NOTE — Progress Notes (Signed)
EPIC Encounter for ICM Monitoring  Patient Name: [name] is a 83 y.o. male Date: 06/13/2016 Primary Care Physican: [name], MD Primary Cardiologist:Cooper Electrophysiologist: Caryl Comes Dry Weight:177 lbs      Heart Failure questions reviewed, pt symptomatic with ankle swelling and 2 lb weight gain.  He stated he has not had swelling before and asked why it occurs.  Discussed diet and advised too much salt or fluid can cause symptoms.    Thoracic impedance abnormal suggesting fluid accumulation.  Prescribed Furosemide 20 mg 1 tablet as needed  Labs: 04/19/2016 Creatinine 0.9, BUN 13, Potassium 4.8, Sodium 131, EGFR 80.8 to 97.8 11/27/2017Creatinine 0.96, BUN 12, Potassium 4.4, Sodium 131 02/01/2016 Creatinine 1.00, BUN 9, Potassium 4.4, Sodium 131  10/25/2015 Creatinine 0.97, BUN 9, Potassium 4.1, Sodium 128  09/07/2015 Creatinine 0.99, BUN 11, Potassium 4.8, Sodium 131  09/02/2015 Creatinine 1.18, BUN 15, Potassium 4.5, Sodium 131  09/01/2015 Creatinine 1.10, BUN 13, Potassium 4.0, Sodium 139  07/19/2015 Creatinine 0.96, BUN 12, Potassium 4.6, Sodium 139  07/11/2015 Creatinine 0.91, BUN 11, Potassium 4.5, Sodium 127  03/28/2015 Creatinine 0.94, BUN 11, Potassium 4.5, Sodium 130  03/24/2015 Creatinine 0.98, BUN 12, Potassium 4.6, Sodium 131   Recommendations:  Patient has taken the prn Furosemide today and yesterday.  Advised to take 1 tablet tomorrow.  Advised if symptoms not resolved after 3rd day of extra Furosemide he can take additional days if needed.  Discussed reading food labels to determine amount of daily salt intake.  He has been eating at restaurants a lot and explained restaurant food is very high in salt and he may be getting at least double amount of salt then is recommended.  Encouraged him to cut back on eating out.   Follow-up plan: ICM clinic phone appointment on 06/16/2016 to recheck fluid levels.  Office appointment scheduled on 07/27/2016 with Dr  Caryl Comes for defib check and 08/02/2016 with Dr Burt Knack.  Copy of ICM check sent to primary cardiologist and device physician.   3 month ICM trend: 06/13/2016   1 Year ICM trend:      Rosalene Billings, RN 06/13/2016 8:29 AM

## 2016-06-16 ENCOUNTER — Ambulatory Visit (INDEPENDENT_AMBULATORY_CARE_PROVIDER_SITE_OTHER): Payer: Self-pay

## 2016-06-16 DIAGNOSIS — I5022 Chronic systolic (congestive) heart failure: Secondary | ICD-10-CM

## 2016-06-16 DIAGNOSIS — Z95810 Presence of automatic (implantable) cardiac defibrillator: Secondary | ICD-10-CM

## 2016-06-16 NOTE — Progress Notes (Signed)
EPIC Encounter for ICM Monitoring  Patient Name: [name] is a 83 y.o. male Date: 06/16/2016 Primary Care Physican: [name], MD Primary Cardiologist:Cooper Electrophysiologist: Caryl Comes Dry Weight:177lbs        Heart Failure questions reviewed, pt reported most of the ankle swelling has resolved and weight has decreased by 2 pounds but he is still at 177 lbs..     Thoracic impedance remains abnormal suggesting fluid accumulation after taking prn Furosemide x 3 days.  He had pneumonia in March and thinks maybe this is why he is having trouble with fluid.   Prescribed Furosemide 20 mg 1 tablet as needed  Labs: 04/19/2016 Creatinine 0.9, BUN 13, Potassium 4.8, Sodium 131, EGFR 80.8 to 97.8 11/27/2017Creatinine 0.96, BUN 12, Potassium 4.4, Sodium 131 02/01/2016 Creatinine 1.00, BUN 9, Potassium 4.4, Sodium 131  10/25/2015 Creatinine 0.97, BUN 9, Potassium 4.1, Sodium 128  09/07/2015 Creatinine 0.99, BUN 11, Potassium 4.8, Sodium 131  09/02/2015 Creatinine 1.18, BUN 15, Potassium 4.5, Sodium 131  09/01/2015 Creatinine 1.10, BUN 13, Potassium 4.0, Sodium 139  07/19/2015 Creatinine 0.96, BUN 12, Potassium 4.6, Sodium 139  07/11/2015 Creatinine 0.91, BUN 11, Potassium 4.5, Sodium 127  03/28/2015 Creatinine 0.94, BUN 11, Potassium 4.5, Sodium 130  03/24/2015 Creatinine 0.98, BUN 12, Potassium 4.6, Sodium 131   Recommendations:  Advised him to take prn Furosemide 20 mg x 2 days and then return to prn.  Encouraged to limit salt in diet.      Follow-up plan: ICM clinic phone appointment on 07/04/2016.  Office appointment scheduled on 07/27/2016 with Dr Caryl Comes for defib check and 08/02/2016 with Dr Burt Knack.  Copy of ICM check sent to primary cardiologist and device physician.   3 month ICM trend: 06/16/2016   1 Year ICM trend:      Rosalene Billings, RN 06/16/2016 8:05 AM

## 2016-06-17 ENCOUNTER — Other Ambulatory Visit: Payer: Self-pay | Admitting: Cardiovascular Disease

## 2016-06-17 DIAGNOSIS — I5022 Chronic systolic (congestive) heart failure: Secondary | ICD-10-CM

## 2016-06-20 ENCOUNTER — Ambulatory Visit (INDEPENDENT_AMBULATORY_CARE_PROVIDER_SITE_OTHER): Payer: Medicare Other | Admitting: Pharmacist

## 2016-06-20 DIAGNOSIS — Z7901 Long term (current) use of anticoagulants: Secondary | ICD-10-CM | POA: Diagnosis not present

## 2016-06-20 DIAGNOSIS — I482 Chronic atrial fibrillation, unspecified: Secondary | ICD-10-CM

## 2016-06-20 DIAGNOSIS — M1711 Unilateral primary osteoarthritis, right knee: Secondary | ICD-10-CM | POA: Diagnosis not present

## 2016-06-20 DIAGNOSIS — Z5181 Encounter for therapeutic drug level monitoring: Secondary | ICD-10-CM

## 2016-06-20 LAB — POCT INR: INR: 2.7

## 2016-06-27 DIAGNOSIS — M1711 Unilateral primary osteoarthritis, right knee: Secondary | ICD-10-CM | POA: Diagnosis not present

## 2016-07-03 DIAGNOSIS — K5900 Constipation, unspecified: Secondary | ICD-10-CM | POA: Diagnosis not present

## 2016-07-03 DIAGNOSIS — Z6824 Body mass index (BMI) 24.0-24.9, adult: Secondary | ICD-10-CM | POA: Diagnosis not present

## 2016-07-03 DIAGNOSIS — R0781 Pleurodynia: Secondary | ICD-10-CM | POA: Diagnosis not present

## 2016-07-03 DIAGNOSIS — I482 Chronic atrial fibrillation: Secondary | ICD-10-CM | POA: Diagnosis not present

## 2016-07-03 DIAGNOSIS — I5022 Chronic systolic (congestive) heart failure: Secondary | ICD-10-CM | POA: Diagnosis not present

## 2016-07-04 ENCOUNTER — Ambulatory Visit (INDEPENDENT_AMBULATORY_CARE_PROVIDER_SITE_OTHER): Payer: Self-pay | Admitting: Internal Medicine

## 2016-07-04 ENCOUNTER — Ambulatory Visit (INDEPENDENT_AMBULATORY_CARE_PROVIDER_SITE_OTHER): Payer: Self-pay

## 2016-07-04 DIAGNOSIS — Z95810 Presence of automatic (implantable) cardiac defibrillator: Secondary | ICD-10-CM

## 2016-07-04 DIAGNOSIS — I482 Chronic atrial fibrillation, unspecified: Secondary | ICD-10-CM

## 2016-07-04 DIAGNOSIS — I5022 Chronic systolic (congestive) heart failure: Secondary | ICD-10-CM

## 2016-07-04 DIAGNOSIS — Z5181 Encounter for therapeutic drug level monitoring: Secondary | ICD-10-CM

## 2016-07-04 LAB — POCT INR: INR: 2.7

## 2016-07-04 NOTE — Progress Notes (Signed)
EPIC Encounter for ICM Monitoring  Patient Name: [name] is a 83 y.o. male Date: 07/04/2016 Primary Care Physican: [name], MD Primary Cardiologist:Cooper Electrophysiologist: Caryl Comes Dry Weight:176lbs                                                  Heart Failure questions reviewed, pt    Thoracic impedance returned to normal  Prescribed Furosemide 20 mg 1 tablet as needed  Labs: 04/19/2016 Creatinine 0.9, BUN 13, Potassium 4.8, Sodium 131, EGFR 80.8 to 97.8 11/27/2017Creatinine 0.96, BUN 12, Potassium 4.4, Sodium 131 02/01/2016 Creatinine 1.00, BUN 9, Potassium 4.4, Sodium 131  10/25/2015 Creatinine 0.97, BUN 9, Potassium 4.1, Sodium 128  09/07/2015 Creatinine 0.99, BUN 11, Potassium 4.8, Sodium 131  09/02/2015 Creatinine 1.18, BUN 15, Potassium 4.5, Sodium 131  09/01/2015 Creatinine 1.10, BUN 13, Potassium 4.0, Sodium 139  07/19/2015 Creatinine 0.96, BUN 12, Potassium 4.6, Sodium 139  07/11/2015 Creatinine 0.91, BUN 11, Potassium 4.5, Sodium 127  03/28/2015 Creatinine 0.94, BUN 11, Potassium 4.5, Sodium 130  03/24/2015 Creatinine 0.98, BUN 12, Potassium 4.6, Sodium 131   Recommendations: No changes. Discussed to limit salt intake to 2000 mg/day and fluid intake to < 2 liters/day.  Encouraged to call for fluid symptoms.  Follow-up plan: ICM clinic phone appointment on 08/29/2016.  Office appointment scheduled on 07/27/2016 with Dr Caryl Comes for defib check and 08/02/2016 with Dr Burt Knack.  Copy of ICM check sent to device physician.   3 month ICM trend: 07/04/2016   1 Year ICM trend:      Rosalene Billings, RN 07/04/2016 9:44 AM

## 2016-07-05 ENCOUNTER — Encounter (HOSPITAL_COMMUNITY): Payer: Self-pay | Admitting: Vascular Surgery

## 2016-07-05 ENCOUNTER — Emergency Department (HOSPITAL_COMMUNITY)
Admission: EM | Admit: 2016-07-05 | Discharge: 2016-07-09 | Disposition: A | Discharge status: Home / Self Care | Payer: Medicare Other | Attending: Emergency Medicine | Admitting: Emergency Medicine

## 2016-07-05 DIAGNOSIS — R52 Pain, unspecified: Secondary | ICD-10-CM | POA: Diagnosis not present

## 2016-07-05 DIAGNOSIS — Z8673 Personal history of transient ischemic attack (TIA), and cerebral infarction without residual deficits: Secondary | ICD-10-CM | POA: Insufficient documentation

## 2016-07-05 DIAGNOSIS — I5032 Chronic diastolic (congestive) heart failure: Secondary | ICD-10-CM | POA: Insufficient documentation

## 2016-07-05 DIAGNOSIS — R109 Unspecified abdominal pain: Secondary | ICD-10-CM | POA: Diagnosis not present

## 2016-07-05 DIAGNOSIS — Z8546 Personal history of malignant neoplasm of prostate: Secondary | ICD-10-CM | POA: Diagnosis not present

## 2016-07-05 DIAGNOSIS — I252 Old myocardial infarction: Secondary | ICD-10-CM | POA: Diagnosis not present

## 2016-07-05 DIAGNOSIS — Z7901 Long term (current) use of anticoagulants: Secondary | ICD-10-CM | POA: Insufficient documentation

## 2016-07-05 DIAGNOSIS — Z951 Presence of aortocoronary bypass graft: Secondary | ICD-10-CM | POA: Insufficient documentation

## 2016-07-05 DIAGNOSIS — Z87891 Personal history of nicotine dependence: Secondary | ICD-10-CM | POA: Insufficient documentation

## 2016-07-05 DIAGNOSIS — E039 Hypothyroidism, unspecified: Secondary | ICD-10-CM | POA: Insufficient documentation

## 2016-07-05 DIAGNOSIS — M5489 Other dorsalgia: Secondary | ICD-10-CM | POA: Diagnosis not present

## 2016-07-05 DIAGNOSIS — Z7982 Long term (current) use of aspirin: Secondary | ICD-10-CM | POA: Insufficient documentation

## 2016-07-05 DIAGNOSIS — N281 Cyst of kidney, acquired: Secondary | ICD-10-CM | POA: Diagnosis not present

## 2016-07-05 LAB — CBC
HCT: 37.1 % — ABNORMAL LOW (ref 39.0–52.0)
HEMOGLOBIN: 12.2 g/dL — AB (ref 13.0–17.0)
MCH: 30.4 pg (ref 26.0–34.0)
MCHC: 32.9 g/dL (ref 30.0–36.0)
MCV: 92.5 fL (ref 78.0–100.0)
PLATELETS: 199 10*3/uL (ref 150–400)
RBC: 4.01 MIL/uL — ABNORMAL LOW (ref 4.22–5.81)
RDW: 13.2 % (ref 11.5–15.5)
WBC: 6.6 10*3/uL (ref 4.0–10.5)

## 2016-07-05 LAB — URINALYSIS, ROUTINE W REFLEX MICROSCOPIC
BILIRUBIN URINE: NEGATIVE
GLUCOSE, UA: NEGATIVE mg/dL
HGB URINE DIPSTICK: NEGATIVE
Ketones, ur: NEGATIVE mg/dL
Leukocytes, UA: NEGATIVE
Nitrite: NEGATIVE
Protein, ur: NEGATIVE mg/dL
SPECIFIC GRAVITY, URINE: 1.015 (ref 1.005–1.030)
pH: 6 (ref 5.0–8.0)

## 2016-07-05 LAB — PROTIME-INR
INR: 2.49
PROTHROMBIN TIME: 27.4 s — AB (ref 11.4–15.2)

## 2016-07-05 LAB — COMPREHENSIVE METABOLIC PANEL
ALBUMIN: 3.4 g/dL — AB (ref 3.5–5.0)
ALT: 12 U/L — ABNORMAL LOW (ref 17–63)
ANION GAP: 6 (ref 5–15)
AST: 17 U/L (ref 15–41)
Alkaline Phosphatase: 52 U/L (ref 38–126)
BUN: 14 mg/dL (ref 6–20)
CALCIUM: 8.7 mg/dL — AB (ref 8.9–10.3)
CHLORIDE: 95 mmol/L — AB (ref 101–111)
CO2: 26 mmol/L (ref 22–32)
Creatinine, Ser: 0.94 mg/dL (ref 0.61–1.24)
GFR calc non Af Amer: >60 mL/min (ref >60)
GLUCOSE: 103 mg/dL — AB (ref 65–99)
POTASSIUM: 4.6 mmol/L (ref 3.5–5.1)
SODIUM: 127 mmol/L — AB (ref 135–145)
Total Bilirubin: 0.6 mg/dL (ref 0.3–1.2)
Total Protein: 6 g/dL — ABNORMAL LOW (ref 6.5–8.1)

## 2016-07-05 LAB — LIPASE, BLOOD: LIPASE: 32 U/L (ref 11–51)

## 2016-07-05 MED ORDER — IOPAMIDOL (ISOVUE-300) INJECTION 61%
INTRAVENOUS | Status: AC
  Administered 2016-07-06: 100 mL
  Filled 2016-07-05: qty 100

## 2016-07-05 MED ORDER — ONDANSETRON HCL 4 MG/2ML IJ SOLN
4.0000 mg | Freq: Once | INTRAMUSCULAR | Status: AC
  Administered 2016-07-05: 4 mg via INTRAVENOUS
  Filled 2016-07-05: qty 2

## 2016-07-05 MED ORDER — FENTANYL CITRATE (PF) 100 MCG/2ML IJ SOLN
50.0000 ug | Freq: Once | INTRAMUSCULAR | Status: AC
  Administered 2016-07-05: 50 ug via INTRAVENOUS
  Filled 2016-07-05: qty 2

## 2016-07-05 NOTE — ED Triage Notes (Signed)
Pt reports to the ED for eval of left lower back pain. He has been having this pain x several days. He was seen by his PCP who thought he was constipated and gave him medication and states that he did have a BM and the pain got better but did not completely resolve and he states that he started having the pain again. He states that he took another laxative because he has not had a BM in the past few days but he states the medication did not work because it only made him pass gas. Pain is not worse with palpation. Denies any numbness, tingling, paralysis, or bowel or bladder changes. Pt reports some abd pain earlier but denies any at this time.

## 2016-07-05 NOTE — ED Notes (Signed)
Patient unable to provide urine specimen at this time.

## 2016-07-05 NOTE — ED Provider Notes (Signed)
[facility] DEPT Provider Note   CSN: [id number] Arrival date & time: 07/05/16  1947     History   Chief Complaint Chief Complaint  Patient presents with  . Back Pain  . Constipation    HPI CORDAE MCCAREY is a 83 y.o. male.  Patient with history of atrial fibrillation on Coumadin, ischemic cardiomyopathy with low EF, severe aortic stenosis, history of PE, history of CVA, history of MI, history of prostate cancer -- presents with several days of intermittent left middle back and flank pains. Patient was seen by his PCP who ordered abdominal x-rays. Patient was treated for constipation. Initially patient had several large bowel movements that seemed to improve his pain however symptoms returned shortly after. Pain is severe at times. Patient has had difficulty with urination and burning with urination with dark urine. No fevers, chest pain or shortness of breath. He does not feel like he is in heart failure. The onset of this condition was acute. The course is intermittent. Aggravating factors: none. Alleviating factors: none. Patient had a CT in 2016 showing normal caliber aorta.       Past Medical History:  Diagnosis Date  . AF (atrial fibrillation) ([place])    Has not tolerated amiodarone in the past. Amiodarone was stopped in September of 2010 due to side effects  . Aortic stenosis, severe    WITH PERCUTANEOUS AORTIC VALVE (TAVI) IN March 2012 at the [facility]  . Cervical myelopathy ([place])   . CHF (congestive heart failure) ([place])   . Chronic anticoagulation    on coumadin  . Coronary artery disease   . Diverticulitis    CURRENTLY CONTROLLED WITH NO EVIDENCE OF RECURRENT INFECTION  . Esophageal stricture    WITH DILATATION  . GERD (gastroesophageal reflux disease)   . Heart murmur   . Hyperlipidemia   . Ischemic cardiomyopathy 05/2010   Has EF of 25%  . MI (myocardial infarction) ([place]) 1974, 1978  . NSVT (nonsustained ventricular tachycardia) ([place])   .  Osteoarthritis    RIGHT KNEE  . Osteoporosis   . Prostate cancer ([place])   . Pulmonary embolism ([place])   . Stroke [facility])    3 strokes    Patient Active Problem List   Diagnosis Date Noted  . Shortness of breath   . Weakness 09/01/2015  . Hyponatremia 09/01/2015  . Hypothyroidism 09/01/2015  . DOE (dyspnea on exertion) 09/01/2015  . Chronic systolic CHF (congestive heart failure) ([place]) 09/01/2015  . Exertional dyspnea 09/01/2015  . GI bleed 01/16/2014  . GIB (gastrointestinal bleeding) 01/16/2014  . Encounter for therapeutic drug monitoring 05/09/2013  . Automatic implantable cardioverter-defibrillator in situ 12/13/2012  . Rectal bleed 05/30/2012    Class: Acute  . Acute low back pain 05/30/2012    Class: Acute  . Hypotension 05/30/2012    Class: Acute  . Vertebral fracture, osteoporotic ([place]) 05/21/2012  . Gait instability 12/02/2011    Class: Acute  . Atrial fibrillation, chronic ([place]) 12/02/2011    Class: Chronic  . Anticoagulated on Coumadin 12/02/2011  . Deformity, finger acquired 12/19/2010  . Chronic cough 12/05/2010  . S/P aortic valve replacement 10/22/2010  . Ischemic cardiomyopathy   . Diverticulitis   . Prostate cancer ([place])   . Osteoarthritis   . Cervical myelopathy ([place])   . Pulmonary embolism ([place])   . Hyperlipidemia   . GERD (gastroesophageal reflux disease)   . Esophageal stricture   . Indigestion 06/29/2010  . VENTRICULAR TACHYCARDIA 01/15/2009    Past Surgical  History:  Procedure Laterality Date  . AORTIC VALVE REPLACEMENT     Percutaneous AVR in March 2012 at the [facility]  . BACK SURGERY    . CARDIAC CATHETERIZATION  2010   SEVERE LV DYSFUNCTION WITH ESTIMATED EJECTION FRACTION OF 25%  . CARDIOVERSION  02/16/2011   Procedure: CARDIOVERSION;  Surgeon: Carlena Bjornstad, MD;  Location: [place];  Service: Cardiovascular;  Laterality: N/A;  . CHOLECYSTECTOMY    . CORONARY ARTERY BYPASS GRAFT  1979  . CORONARY ARTERY BYPASS GRAFT  1991   REDO  SURGERY  . ICD  Feb 2003; 02/2013   gen change 02-11-2013 by Dr Caryl Comes  . IMPLANTABLE CARDIOVERTER DEFIBRILLATOR (ICD) GENERATOR CHANGE N/A 02/10/2013   Procedure: ICD GENERATOR CHANGE;  Surgeon: Deboraha Sprang, MD;  Location: [facility] CATH LAB;  Service: Cardiovascular;  Laterality: N/A;  . SHOULDER SURGERY         Home Medications    Prior to Admission medications   Medication Sig Start Date End Date Taking? Authorizing Provider  acetaminophen (TYLENOL) 500 MG tablet Take 1,000 mg by mouth every 6 (six) hours as needed for mild pain.     Historical Provider, MD  Albuterol Sulfate (PROAIR HFA IN) Inhale into the lungs as needed (wheezing).     Historical Provider, MD  alfuzosin (UROXATRAL) 10 MG 24 hr tablet Take 10 mg by mouth at bedtime.     Historical Provider, MD  aspirin 81 MG tablet Take 81 mg by mouth daily.    Historical Provider, MD  atorvastatin (LIPITOR) 10 MG tablet Take 10 mg by mouth every other day.    Historical Provider, MD  benzonatate (TESSALON) 100 MG capsule Take 100 mg by mouth 3 (three) times daily as needed for cough.    Historical Provider, MD  Budesonide-Formoterol Fumarate (SYMBICORT IN) Inhale 1 puff into the lungs 2 (two) times daily.    Historical Provider, MD  carvedilol (COREG) 6.25 MG tablet Take 1 tablet (6.25 mg total) by mouth 2 (two) times daily with a meal. 07/29/15   Sherren Mocha, MD  cholecalciferol (VITAMIN D) 1000 UNITS tablet Take 1,000 Units by mouth 2 (two) times daily.    Historical Provider, MD  COUMADIN 5 MG tablet TAKE AS DIRECTED BY COUMADIN CLINIC. Patient taking differently: Take 5mg  daily. 12/27/15   Sherren Mocha, MD  furosemide (LASIX) 20 MG tablet TAKE (1) TABLET DAILY AS NEEDED FOR SWELLING. 06/19/16   Sherren Mocha, MD  ipratropium (ATROVENT) 0.03 % nasal spray Place 2 sprays into both nostrils 2 (two) times daily.    Historical Provider, MD  levothyroxine (SYNTHROID) 125 MCG tablet Take 1 tablet (125 mcg total) by mouth daily before  breakfast. 09/02/15   Hosie Poisson, MD  NITROSTAT 0.4 MG SL tablet DISSOLVE 1 TABLET UNDER TONGUE AS NEEDED FOR CHEST PAIN,MAY REPEAT IN5 MINUTES FOR 2 DOSES. 07/26/15   Sherren Mocha, MD  omeprazole (PRILOSEC) 20 MG capsule Take 20 mg by mouth daily as needed (acid reflux).    Historical Provider, MD  polyethylene glycol (MIRALAX / GLYCOLAX) packet Take 17 g by mouth daily as needed for mild constipation.     Historical Provider, MD  Probiotic Product (PROBIOTIC DAILY PO) Take 1 capsule by mouth daily.    Historical Provider, MD  RESTASIS 0.05 % ophthalmic emulsion Place 1 drop into both eyes every 12 (twelve) hours.  12/05/11   Historical Provider, MD    Family History Family History  Problem Relation Age of Onset  . Heart  disease Mother 107  . Diabetes Mother 36  . Heart disease Father 61    Social History Social History  Substance Use Topics  . Smoking status: Former Smoker    Packs/day: 4.00    Years: 15.00    Types: Cigarettes    Quit date: 06/15/1969  . Smokeless tobacco: Never Used  . Alcohol use No     Allergies   Penicillins; Tizanidine; Ace inhibitors; Antihistamines, diphenhydramine-type; Clemastine fumarate; and Amiodarone   Review of Systems Review of Systems  Constitutional: Negative for fever.  HENT: Negative for rhinorrhea and sore throat.   Eyes: Negative for redness.  Respiratory: Negative for cough.   Cardiovascular: Negative for chest pain.  Gastrointestinal: Positive for constipation. Negative for abdominal pain, diarrhea, nausea and vomiting.  Genitourinary: Positive for flank pain. Negative for dysuria.  Musculoskeletal: Positive for back pain. Negative for myalgias.  Skin: Negative for rash.  Neurological: Negative for headaches.     Physical Exam Updated Vital Signs BP (!) 165/70 (BP Location: Left Arm)   Pulse 69   Temp 97.8 F (36.6 C) (Oral)   Resp 18   SpO2 97%   Physical Exam  Constitutional: He appears well-developed and  well-nourished.  HENT:  Head: Normocephalic and atraumatic.  Mouth/Throat: Oropharynx is clear and moist.  Eyes: Conjunctivae are normal. Right eye exhibits no discharge. Left eye exhibits no discharge.  Neck: Normal range of motion. Neck supple.  Cardiovascular: Normal rate, regular rhythm and normal heart sounds.   Pulmonary/Chest: Effort normal and breath sounds normal. No respiratory distress. He has no wheezes. He has no rales.  Abdominal: Soft. There is no tenderness. There is no rebound and no guarding.  No tenderness with palpation of abdomen, some tenderness with palpation of left flank and middle back.  Neurological: He is alert.  Skin: Skin is warm and dry.  Psychiatric: He has a normal mood and affect.  Nursing note and vitals reviewed.    ED Treatments / Results  Labs (all labs ordered are listed, but only abnormal results are displayed) Labs Reviewed  COMPREHENSIVE METABOLIC PANEL - Abnormal; Notable for the following:       Result Value   Sodium 127 (*)    Chloride 95 (*)    Glucose, Bld 103 (*)    Calcium 8.7 (*)    Total Protein 6.0 (*)    Albumin 3.4 (*)    ALT 12 (*)    All other components within normal limits  CBC - Abnormal; Notable for the following:    RBC 4.01 (*)    Hemoglobin 12.2 (*)    HCT 37.1 (*)    All other components within normal limits  PROTIME-INR - Abnormal; Notable for the following:    Prothrombin Time 27.4 (*)    All other components within normal limits  LIPASE, BLOOD  URINALYSIS, ROUTINE W REFLEX MICROSCOPIC     Radiology Ct Abdomen Pelvis W Contrast  Result Date: 07/06/2016 CLINICAL DATA:  Acute onset of left flank pain and nausea. Initial encounter. EXAM: CT ABDOMEN AND PELVIS WITH CONTRAST TECHNIQUE: Multidetector CT imaging of the abdomen and pelvis was performed using the standard protocol following bolus administration of intravenous contrast. CONTRAST:  100 mL ISOVUE-300 IOPAMIDOL (ISOVUE-300) INJECTION 61% COMPARISON:   CT of the abdomen and pelvis from 11/25/2014 FINDINGS: Lower chest: Bibasilar scarring is noted. Diffuse coronary artery calcifications are seen. A pacemaker lead is partially imaged. An aortic valve replacement is noted. Hepatobiliary: The liver is unremarkable in appearance.  The patient is status post cholecystectomy, with clips noted at the gallbladder fossa. The common bile duct remains normal in caliber. Pancreas: The pancreas is within normal limits. Spleen: The spleen is unremarkable in appearance. Adrenals/Urinary Tract: The adrenal glands are unremarkable in appearance. A small left renal cyst is noted. Nonspecific perinephric stranding is noted bilaterally. There is no evidence of hydronephrosis. No renal or ureteral stones are identified. Stomach/Bowel: The stomach is unremarkable in appearance. The small bowel is within normal limits. The appendix is normal in caliber, without evidence of appendicitis. The colon is unremarkable in appearance. Vascular/Lymphatic: Scattered calcification is seen along the abdominal aorta and its branches. The abdominal aorta is otherwise grossly unremarkable. The inferior vena cava is grossly unremarkable. No retroperitoneal lymphadenopathy is seen. No pelvic sidewall lymphadenopathy is identified. Reproductive: The bladder is moderately distended and grossly unremarkable. The prostate is normal in size, with a few brachytherapy seeds noted. Other: No additional soft tissue abnormalities are seen. Musculoskeletal: No acute osseous abnormalities are identified. Chronic compression deformities are noted at T12, L1 and L4. The visualized musculature is unremarkable in appearance. IMPRESSION: 1. No acute abnormality seen to explain the patient's symptoms. 2. Scattered aortic atherosclerosis. 3. Diffuse coronary artery calcifications seen. 4. Mild scarring at the lung bases. 5. Small left renal cyst seen. 6. Chronic compression deformities at T12, L1 and L4. Electronically  Signed   By: Garald Balding M.D.   On: 07/06/2016 00:49    Procedures Procedures (including critical care time)  Medications Ordered in ED Medications  fentaNYL (SUBLIMAZE) injection 50 mcg (50 mcg Intravenous Given 07/05/16 2317)  ondansetron (ZOFRAN) injection 4 mg (4 mg Intravenous Given 07/05/16 2317)  iopamidol (ISOVUE-300) 61 % injection (100 mLs  Contrast Given 07/06/16 0003)     Initial Impression / Assessment and Plan / ED Course  I have reviewed the triage vital signs and the nursing notes.  Pertinent labs & imaging results that were available during my care of the patient were reviewed by me and considered in my medical decision making (see chart for details).     Patient seen and examined. Work-up initiated. Medications ordered.   Vital signs reviewed and are as follows: BP (!) 150/72   Pulse 96   Temp 97.8 F (36.6 C) (Oral)   Resp 18   SpO2 97%   1:29 AM Patient feels better. Discussed results with patient and family. Discussed with Dr. Venora Maples who has seen patient.  Will continue treating constipation with Linzess and MiraLAX. Patient was prescribed tramadol and Valium by PCP. Encouraged using these sparingly and only if needed for severe symptoms. Will add Bentyl.  Use pain medication only under direct supervision at the lowest possible dose needed to control your pain.   The patient was urged to return to the Emergency Department immediately with worsening of current symptoms, worsening abdominal pain, persistent vomiting, blood noted in stools, fever, or any other concerns. The patient verbalized understanding.    Final Clinical Impressions(s) / ED Diagnoses   Final diagnoses:  Acute left flank pain   Patient with intermittent left flank pain over the past several days. Possibly related to constipation. Tonight, CT is reassuring without aortic etiology, bowel obstruction, kidney stone, or other emergent etiology which would cause his pain. Symptoms are  well-controlled at the current time without vomiting. Labs are reassuring. Chronic hyponatremia and hypochloremia noted. Anemia baseline. No UTI.  New Prescriptions New Prescriptions   DICYCLOMINE (BENTYL) 20 MG TABLET    Take 1 tablet (  20 mg total) by mouth 2 (two) times daily as needed for spasms.     Carlisle Cater, PA-C 07/06/16 [place], MD 07/06/16 [phone]

## 2016-07-05 NOTE — ED Notes (Signed)
Patient's wife up to desk.  Expressing frustration with wait times.  This Rn explained delays, apologized, and assured patient we are attempting to room as quickly as we can.

## 2016-07-06 ENCOUNTER — Encounter (HOSPITAL_COMMUNITY): Payer: Self-pay | Admitting: Radiology

## 2016-07-06 ENCOUNTER — Emergency Department (HOSPITAL_COMMUNITY): Payer: Medicare Other

## 2016-07-06 DIAGNOSIS — N281 Cyst of kidney, acquired: Secondary | ICD-10-CM | POA: Diagnosis not present

## 2016-07-06 DIAGNOSIS — L570 Actinic keratosis: Secondary | ICD-10-CM | POA: Diagnosis not present

## 2016-07-06 DIAGNOSIS — L821 Other seborrheic keratosis: Secondary | ICD-10-CM | POA: Diagnosis not present

## 2016-07-06 DIAGNOSIS — R109 Unspecified abdominal pain: Secondary | ICD-10-CM | POA: Diagnosis not present

## 2016-07-06 DIAGNOSIS — D485 Neoplasm of uncertain behavior of skin: Secondary | ICD-10-CM | POA: Diagnosis not present

## 2016-07-06 DIAGNOSIS — Z85828 Personal history of other malignant neoplasm of skin: Secondary | ICD-10-CM | POA: Diagnosis not present

## 2016-07-06 MED ORDER — DICYCLOMINE HCL 20 MG PO TABS: 20.0000 mg | ORAL_TABLET | Freq: Two times a day (BID) | ORAL | 0 refills | Status: DC | PRN

## 2016-07-06 NOTE — Discharge Instructions (Signed)
Please read and follow all provided instructions.  Your diagnoses today include:  1. Acute left flank pain     Tests performed today include:  Blood counts and electrolytes  Blood tests to check liver and kidney function  Blood tests to check pancreas function  Urine test to look for infection - no infection  CT scan of abdomen - no kidney stone, bowel blockage or other serious problem, you do have moderate amount of stoll in large intestine  Vital signs. See below for your results today.   Medications prescribed:   Bentyl - medication for intestinal cramps and spasms  Take any prescribed medications only as directed.  Home care instructions:   Follow any educational materials contained in this packet.  Follow-up instructions: Please follow-up with your primary care provider in the next 2 days for further evaluation of your symptoms.    Return instructions:  SEEK IMMEDIATE MEDICAL ATTENTION IF:  The pain does not go away or becomes severe   A temperature above 101F develops   Repeated vomiting occurs (multiple episodes)   The pain becomes localized to portions of the abdomen. The right side could possibly be appendicitis. In an adult, the left lower portion of the abdomen could be colitis or diverticulitis.   Blood is being passed in stools or vomit (bright red or black tarry stools)   You develop chest pain, difficulty breathing, dizziness or fainting, or become confused, poorly responsive, or inconsolable (young children)  If you have any other emergent concerns regarding your health  Additional Information: Abdominal (belly) pain can be caused by many things. Your caregiver performed an examination and possibly ordered blood/urine tests and imaging (CT scan, x-rays, ultrasound). Many cases can be observed and treated at home after initial evaluation in the emergency department. Even though you are being discharged home, abdominal pain can be unpredictable.  Therefore, you need a repeated exam if your pain does not resolve, returns, or worsens. Most patients with abdominal pain don't have to be admitted to the hospital or have surgery, but serious problems like appendicitis and gallbladder attacks can start out as nonspecific pain. Many abdominal conditions cannot be diagnosed in one visit, so follow-up evaluations are very important.  Your vital signs today were: BP (!) 150/72    Pulse 96    Temp 97.8 F (36.6 C) (Oral)    Resp 18    SpO2 97%  If your blood pressure (bp) was elevated above 135/85 this visit, please have this repeated by your doctor within one month. --------------

## 2016-07-12 DIAGNOSIS — M47816 Spondylosis without myelopathy or radiculopathy, lumbar region: Secondary | ICD-10-CM | POA: Diagnosis not present

## 2016-07-12 DIAGNOSIS — M810 Age-related osteoporosis without current pathological fracture: Secondary | ICD-10-CM | POA: Diagnosis not present

## 2016-07-13 ENCOUNTER — Telehealth: Payer: Self-pay | Admitting: Pharmacist

## 2016-07-13 NOTE — Telephone Encounter (Signed)
Received fax from [facility] that pt needs a lumbar facet injection, date not set yet, with request to hold Coumadin. Pt takes Coumadin for afib with hx of 3 strokes, CHF, and CAD. Also has a history of a PE. Will need to be bridged with Lovenox to hold his Coumadin 5 days prior to spinal injection. Will coordinate in Coumadin clinic. Clearance faxed to#[phone].

## 2016-07-13 NOTE — Telephone Encounter (Signed)
Pt is also aware that he will require a Lovenox bridge. He will call clinic once he has his procedure date set and is aware we will need to see him ~1 week prior. Will likely need INR check the day of his procedure as well (usually a home tester).

## 2016-07-18 ENCOUNTER — Ambulatory Visit (INDEPENDENT_AMBULATORY_CARE_PROVIDER_SITE_OTHER): Payer: Self-pay | Admitting: Pharmacist

## 2016-07-18 DIAGNOSIS — Z5181 Encounter for therapeutic drug level monitoring: Secondary | ICD-10-CM

## 2016-07-18 DIAGNOSIS — I482 Chronic atrial fibrillation, unspecified: Secondary | ICD-10-CM

## 2016-07-18 LAB — POCT INR: INR: 3.2

## 2016-07-24 ENCOUNTER — Other Ambulatory Visit: Payer: Self-pay | Admitting: Cardiovascular Disease

## 2016-07-27 ENCOUNTER — Encounter: Payer: Self-pay | Admitting: Internal Medicine

## 2016-07-27 ENCOUNTER — Ambulatory Visit (INDEPENDENT_AMBULATORY_CARE_PROVIDER_SITE_OTHER): Payer: Medicare Other | Admitting: Internal Medicine

## 2016-07-27 VITALS — BP 112/66 | HR 62 | Ht 74.0 in | Wt 189.0 lb

## 2016-07-27 DIAGNOSIS — I255 Ischemic cardiomyopathy: Secondary | ICD-10-CM

## 2016-07-27 DIAGNOSIS — Z95810 Presence of automatic (implantable) cardiac defibrillator: Secondary | ICD-10-CM

## 2016-07-27 DIAGNOSIS — I482 Chronic atrial fibrillation: Secondary | ICD-10-CM | POA: Diagnosis not present

## 2016-07-27 DIAGNOSIS — I5022 Chronic systolic (congestive) heart failure: Secondary | ICD-10-CM

## 2016-07-27 DIAGNOSIS — I4821 Permanent atrial fibrillation: Secondary | ICD-10-CM

## 2016-07-27 DIAGNOSIS — I2589 Other forms of chronic ischemic heart disease: Secondary | ICD-10-CM

## 2016-07-27 NOTE — Progress Notes (Signed)
Patient Care Team: [name], MD as PCP - General (Internal Medicine)   HPI  [name] is a 83 y.o. male seen in followup for ischemic cardiomyopathy for which he underwent ICD implantation for primary prevention. He has had appropriate therapy for ventricular tachycardia. He underwent generator 12/14 He is status post bypass surgery.  He alo underwent percutaneous aortic valve replacement.with some perivalvular AI LVEDD increased and ARB added 9/15; he was unable to tolerate this.   He also has a history of atrial fibrillation for which amiodarone was initiated. He was intolerant of this and this has been discontinued. His atrial fibrillation is now permanent. He is on warfarin  No bleeding issues and no transient neurological symptoms   He is status post TAVR; back pain recurring on for injection next week  The patient denies chest pain, shortness of breath, nocturnal dyspnea, orthopnea or peripheral edema.  There have been no palpitations, lightheadedness or syncope.    No ICD discharges        Past Medical History:  Diagnosis Date  . AF (atrial fibrillation) ([place])    Has not tolerated amiodarone in the past. Amiodarone was stopped in September of 2010 due to side effects  . Aortic stenosis, severe    WITH PERCUTANEOUS AORTIC VALVE (TAVI) IN March 2012 at the [facility]  . Cervical myelopathy ([name])   . CHF (congestive heart failure) ([place])   . Chronic anticoagulation    on coumadin  . Coronary artery disease   . Diverticulitis    CURRENTLY CONTROLLED WITH NO EVIDENCE OF RECURRENT INFECTION  . Esophageal stricture    WITH DILATATION  . GERD (gastroesophageal reflux disease)   . Heart murmur   . Hyperlipidemia   . Ischemic cardiomyopathy 05/2010   Has EF of 25%  . MI (myocardial infarction) ([place]) 1974, 1978  . NSVT (nonsustained ventricular tachycardia) ([place])   . Osteoarthritis    RIGHT KNEE  . Osteoporosis   . Prostate cancer ([place])   . Pulmonary  embolism ([place])   . Stroke [facility])    3 strokes    Past Surgical History:  Procedure Laterality Date  . AORTIC VALVE REPLACEMENT     Percutaneous AVR in March 2012 at the [facility]  . BACK SURGERY    . CARDIAC CATHETERIZATION  2010   SEVERE LV DYSFUNCTION WITH ESTIMATED EJECTION FRACTION OF 25%  . CARDIOVERSION  02/16/2011   Procedure: CARDIOVERSION;  Surgeon: Carlena Bjornstad, MD;  Location: [facility];  Service: Cardiovascular;  Laterality: N/A;  . CHOLECYSTECTOMY    . CORONARY ARTERY BYPASS GRAFT  1979  . CORONARY ARTERY BYPASS GRAFT  1991   REDO SURGERY  . ICD  Feb 2003; 02/2013   gen change 02-11-2013 by Dr Caryl Comes  . IMPLANTABLE CARDIOVERTER DEFIBRILLATOR (ICD) GENERATOR CHANGE N/A 02/10/2013   Procedure: ICD GENERATOR CHANGE;  Surgeon: Deboraha Sprang, MD;  Location: [facility] CATH LAB;  Service: Cardiovascular;  Laterality: N/A;  . SHOULDER SURGERY      Current Outpatient Prescriptions  Medication Sig Dispense Refill  . acetaminophen (TYLENOL) 500 MG tablet Take 1,000 mg by mouth every 6 (six) hours as needed for mild pain.     Marland Kitchen albuterol (PROVENTIL HFA;VENTOLIN HFA) 108 (90 Base) MCG/ACT inhaler Inhale 1-2 puffs into the lungs every 6 (six) hours as needed for wheezing or shortness of breath.    . alfuzosin (UROXATRAL) 10 MG 24 hr tablet Take 10 mg by mouth at bedtime.     Marland Kitchen  aspirin 81 MG tablet Take 81 mg by mouth daily.    Marland Kitchen atorvastatin (LIPITOR) 10 MG tablet Take 10 mg by mouth every other day.    . benzonatate (TESSALON) 100 MG capsule Take 100 mg by mouth 3 (three) times daily as needed for cough.    . Budesonide-Formoterol Fumarate (SYMBICORT IN) Inhale 1 puff into the lungs 2 (two) times daily.    . carvedilol (COREG) 6.25 MG tablet TAKE 1 TABLET TWICE DAILY WITH A MEAL. 180 tablet 2  . cholecalciferol (VITAMIN D) 1000 UNITS tablet Take 1,000 Units by mouth 2 (two) times daily.    Marland Kitchen COUMADIN 5 MG tablet TAKE AS DIRECTED BY COUMADIN CLINIC. (Patient taking differently: Take 5mg   daily except on wednesday take 2.5mg ) 100 tablet 1  . dicyclomine (BENTYL) 20 MG tablet Take 1 tablet (20 mg total) by mouth 2 (two) times daily as needed for spasms. 20 tablet 0  . furosemide (LASIX) 20 MG tablet TAKE (1) TABLET DAILY AS NEEDED FOR SWELLING. 30 tablet 0  . ipratropium (ATROVENT) 0.03 % nasal spray Place 2 sprays into both nostrils 2 (two) times daily.    Marland Kitchen levothyroxine (SYNTHROID) 125 MCG tablet Take 1 tablet (125 mcg total) by mouth daily before breakfast. 30 tablet 0  . linaclotide (LINZESS) 145 MCG CAPS capsule Take 145 mcg by mouth daily before breakfast.    . NITROSTAT 0.4 MG SL tablet DISSOLVE 1 TABLET UNDER TONGUE AS NEEDED FOR CHEST PAIN,MAY REPEAT IN5 MINUTES FOR 2 DOSES. 25 tablet 10  . omeprazole (PRILOSEC) 20 MG capsule Take 20 mg by mouth daily as needed (acid reflux).    . polyethylene glycol (MIRALAX / GLYCOLAX) packet Take 17 g by mouth daily as needed for mild constipation.     . Probiotic Product (PROBIOTIC DAILY PO) Take 1 capsule by mouth daily.    . RESTASIS 0.05 % ophthalmic emulsion Place 1 drop into both eyes 2 (two) times daily as needed (dry eyes).     . traMADol (ULTRAM) 50 MG tablet Take 50 mg by mouth every 6 (six) hours as needed for moderate pain.     No current facility-administered medications for this visit.     Allergies  Allergen Reactions  . Penicillins Shortness Of Breath and Rash    Has patient had a PCN reaction causing immediate rash, facial/tongue/throat swelling, SOB or lightheadedness with hypotension: Yes Has patient had a PCN reaction causing severe rash involving mucus membranes or skin necrosis: No Has patient had a PCN reaction that required hospitalization No Has patient had a PCN reaction occurring within the last 10 years: No If all of the above answers are "NO", then may proceed with Cephalosporin use.   . Tizanidine Shortness Of Breath    Light headed  . Ace Inhibitors     Has not tolerated in the past due to  hyperkalemia  . Antihistamines, Diphenhydramine-Type Other (See Comments)    Inhibits urination  . Clemastine Fumarate Other (See Comments)    Inhibits urination  . Amiodarone Other (See Comments)    Unknown     Review of Systems negative except from HPI and PMH  Physical Exam BP 112/66   Pulse 62   Ht 6\' 2"  (1.88 m)   Wt 189 lb (85.7 kg)   SpO2 99%   BMI 24.27 kg/m  Well developed and nourished in no acute distress HENT normal Neck supple with JVP-flat Carotids brisk and full without bruits Clear Regular rate and rhythm, 2/6 M  Abd-soft with active BS without hepatomegaly No Clubbing cyanosis edema Skin-warm and dry A & Oriented  Grossly normal sensory and motor function  Walking wi walke   ECG demonstrates atrial fibrillation was infrequent ventricular pacing  Assessment and  Plan  Atrial fibrillation-permanent  Congestive heart failure-chronic-systolic  Implantable defibrillator-[employer]  TAVR  Euvolemic continue current meds  Has anticpated back injections   Device function normal

## 2016-07-28 ENCOUNTER — Ambulatory Visit (INDEPENDENT_AMBULATORY_CARE_PROVIDER_SITE_OTHER): Payer: Medicare Other | Admitting: *Deleted

## 2016-07-28 DIAGNOSIS — Z5181 Encounter for therapeutic drug level monitoring: Secondary | ICD-10-CM

## 2016-07-28 DIAGNOSIS — I482 Chronic atrial fibrillation, unspecified: Secondary | ICD-10-CM

## 2016-07-28 DIAGNOSIS — I255 Ischemic cardiomyopathy: Secondary | ICD-10-CM | POA: Diagnosis not present

## 2016-07-28 LAB — CUP PACEART INCLINIC DEVICE CHECK
Battery Remaining Longevity: 109 mo
Battery Voltage: 3.01 V
Date Time Interrogation Session: 20180517204849
HIGH POWER IMPEDANCE MEASURED VALUE: 57 Ohm
HighPow Impedance: 44 Ohm
Implantable Lead Implant Date: 20021220
Implantable Lead Model: 6944
Lead Channel Impedance Value: 456 Ohm
Lead Channel Impedance Value: 551 Ohm
Lead Channel Pacing Threshold Amplitude: 0.875 V
Lead Channel Pacing Threshold Pulse Width: 0.4 ms
Lead Channel Sensing Intrinsic Amplitude: 19 mV
Lead Channel Setting Pacing Amplitude: 2 V
Lead Channel Setting Pacing Pulse Width: 0.4 ms
MDC IDC LEAD LOCATION: 753860
MDC IDC MSMT LEADCHNL RV SENSING INTR AMPL: 18.125 mV
MDC IDC PG IMPLANT DT: 20141201
MDC IDC SET LEADCHNL RV SENSING SENSITIVITY: 0.3 mV
MDC IDC STAT BRADY RV PERCENT PACED: 4.04 %

## 2016-07-28 LAB — POCT INR: INR: 2.7

## 2016-07-28 MED ORDER — ENOXAPARIN SODIUM 80 MG/0.8ML ~~LOC~~ SOLN: 80.0000 mg | Freq: Two times a day (BID) | SUBCUTANEOUS | 1 refills | Status: DC

## 2016-07-28 NOTE — Patient Instructions (Signed)
07/29/16- Last dose of Coumadin.  07/30/16- No Coumadin or Lovenox.  07/31/16-Inject Lovenox 80mg  in the fatty abdominal tissue at least 2 inches from the belly button twice a day about 12 hours apart, 8am and 8pm rotate sites. No Coumadin.  08/01/16- Inject Lovenox in the fatty tissue every 12 hours, 8am and 8pm. No Coumadin.  08/02/16- Inject Lovenox in the fatty tissue every 12 hours, 8am and 8pm. No Coumadin.  08/03/16- Inject Lovenox in the fatty tissue in the morning at 8 am (No PM dose). No Coumadin.  08/04/16-Check INR this AM.  Procedure Day - No Lovenox - Resume Coumadin in the evening or as directed by doctor   08/05/16-Resume Lovenox inject in the fatty tissue every 12 hours and take Coumadin.  08/06/16- Inject Lovenox in the fatty tissue every 12 hours and take Coumadin.  08/07/16- Inject Lovenox in the fatty tissue every 12 hours and take Coumadin.  08/08/16- Inject Lovenox in the fatty tissue every 12 hours and take Coumadin.  08/09/16-Inject Lovenox in the fatty tissue every 12 hours and take Coumadin.  08/10/16-Check INR call us with result @ [phone].

## 2016-08-02 ENCOUNTER — Encounter: Payer: Self-pay | Admitting: Cardiovascular Disease

## 2016-08-02 ENCOUNTER — Ambulatory Visit (INDEPENDENT_AMBULATORY_CARE_PROVIDER_SITE_OTHER): Payer: Medicare Other | Admitting: Cardiovascular Disease

## 2016-08-02 VITALS — BP 120/70 | HR 62 | Ht 74.0 in | Wt 187.0 lb

## 2016-08-02 DIAGNOSIS — I482 Chronic atrial fibrillation, unspecified: Secondary | ICD-10-CM

## 2016-08-02 DIAGNOSIS — I255 Ischemic cardiomyopathy: Secondary | ICD-10-CM | POA: Diagnosis not present

## 2016-08-02 DIAGNOSIS — I359 Nonrheumatic aortic valve disorder, unspecified: Secondary | ICD-10-CM

## 2016-08-02 DIAGNOSIS — I5022 Chronic systolic (congestive) heart failure: Secondary | ICD-10-CM | POA: Diagnosis not present

## 2016-08-02 DIAGNOSIS — Z952 Presence of prosthetic heart valve: Secondary | ICD-10-CM | POA: Diagnosis not present

## 2016-08-02 NOTE — Progress Notes (Signed)
Cardiology Office Note Date:  08/02/2016   ID:  [name], DOB 11-27-1932, MRN [id number]  PCP:  [name], MD  Cardiologist:  Sherren Mocha, MD    Chief Complaint  Patient presents with  . chronic systolic heart failure    follow up     History of Present Illness: [name] is a 83 y.o. male who presents for Follow-up evaluation. The patient has extensive cardiac history. He has coronary artery disease with initial CABG surgery in 1979 and redo CABG in 1991. He's had a long-standing severe ischemic cardiomyopathy with LVEF less than 30%. The patient is undergone ICD implantation and he is followed regularly by Dr. Caryl Comes. He underwent TAVR the [facility] clinic in 2012 and he has moderate paravalvular aortic insufficiency which has been followed with serial echo studies. He's developed permanent atrial fibrillation and is maintained on long-term anticoagulation. We have not been able to treat him with much medication for his heart failure because of multiple intolerances.  He's awaiting an injection for his right knee - using lovenox for bridging anticoagulation. He hasn't been able to walk much because of his knee problems. He's using a walker. From a cardiac perspective he is doing okay. He denies any recent problems with chest pain, chest pressure, leg swelling, orthopnea, or PND. He has mild dyspnea with activity but he is much more limited from musculoskeletal problems.  Past Medical History:  Diagnosis Date  . AF (atrial fibrillation) ([place])    Has not tolerated amiodarone in the past. Amiodarone was stopped in September of 2010 due to side effects  . Aortic stenosis, severe    WITH PERCUTANEOUS AORTIC VALVE (TAVI) IN March 2012 at the [facility]  . Cervical myelopathy ([place])   . CHF (congestive heart failure) ([place])   . Chronic anticoagulation    on coumadin  . Coronary artery disease   . Diverticulitis    CURRENTLY CONTROLLED WITH NO EVIDENCE OF RECURRENT  INFECTION  . Esophageal stricture    WITH DILATATION  . GERD (gastroesophageal reflux disease)   . Heart murmur   . Hyperlipidemia   . Ischemic cardiomyopathy 05/2010   Has EF of 25%  . MI (myocardial infarction) ([place]) 1974, 1978  . NSVT (nonsustained ventricular tachycardia) ([place])   . Osteoarthritis    RIGHT KNEE  . Osteoporosis   . Prostate cancer ([place])   . Pulmonary embolism ([place])   . Stroke [facility])    3 strokes    Past Surgical History:  Procedure Laterality Date  . AORTIC VALVE REPLACEMENT     Percutaneous AVR in March 2012 at the [facility]  . BACK SURGERY    . CARDIAC CATHETERIZATION  2010   SEVERE LV DYSFUNCTION WITH ESTIMATED EJECTION FRACTION OF 25%  . CARDIOVERSION  02/16/2011   Procedure: CARDIOVERSION;  Surgeon: Carlena Bjornstad, MD;  Location: [facility];  Service: Cardiovascular;  Laterality: N/A;  . CHOLECYSTECTOMY    . CORONARY ARTERY BYPASS GRAFT  1979  . CORONARY ARTERY BYPASS GRAFT  1991   REDO SURGERY  . ICD  Feb 2003; 02/2013   gen change 02-11-2013 by Dr Caryl Comes  . IMPLANTABLE CARDIOVERTER DEFIBRILLATOR (ICD) GENERATOR CHANGE N/A 02/10/2013   Procedure: ICD GENERATOR CHANGE;  Surgeon: Deboraha Sprang, MD;  Location: [facility] CATH LAB;  Service: Cardiovascular;  Laterality: N/A;  . SHOULDER SURGERY      Current Outpatient Prescriptions  Medication Sig Dispense Refill  . acetaminophen (TYLENOL) 500 MG tablet Take 1,000 mg  by mouth every 6 (six) hours as needed for mild pain.     Marland Kitchen albuterol (PROVENTIL HFA;VENTOLIN HFA) 108 (90 Base) MCG/ACT inhaler Inhale 1-2 puffs into the lungs every 6 (six) hours as needed for wheezing or shortness of breath.    . alfuzosin (UROXATRAL) 10 MG 24 hr tablet Take 10 mg by mouth at bedtime.     Marland Kitchen aspirin 81 MG tablet Take 81 mg by mouth daily.    Marland Kitchen atorvastatin (LIPITOR) 10 MG tablet Take 10 mg by mouth every other day.    . benzonatate (TESSALON) 100 MG capsule Take 100 mg by mouth 3 (three) times daily as needed for cough.    .  Budesonide-Formoterol Fumarate (SYMBICORT IN) Inhale 1 puff into the lungs 2 (two) times daily.    . carvedilol (COREG) 6.25 MG tablet TAKE 1 TABLET TWICE DAILY WITH A MEAL. 180 tablet 2  . cholecalciferol (VITAMIN D) 1000 UNITS tablet Take 1,000 Units by mouth 2 (two) times daily.    Marland Kitchen COUMADIN 5 MG tablet TAKE AS DIRECTED BY COUMADIN CLINIC. (Patient taking differently: Take 5mg  daily except on wednesday take 2.5mg ) 100 tablet 1  . dicyclomine (BENTYL) 20 MG tablet Take 1 tablet (20 mg total) by mouth 2 (two) times daily as needed for spasms. 20 tablet 0  . enoxaparin (LOVENOX) 80 MG/0.8ML injection Inject 80 mg into the skin every 12 (twelve) hours.    . enoxaparin (LOVENOX) 80 MG/0.8ML injection Inject 0.8 mLs (80 mg total) into the skin every 12 (twelve) hours. 20 Syringe 1  . furosemide (LASIX) 20 MG tablet TAKE (1) TABLET DAILY AS NEEDED FOR SWELLING. 30 tablet 0  . ipratropium (ATROVENT) 0.03 % nasal spray Place 2 sprays into both nostrils 2 (two) times daily.    Marland Kitchen levothyroxine (SYNTHROID) 125 MCG tablet Take 1 tablet (125 mcg total) by mouth daily before breakfast. 30 tablet 0  . linaclotide (LINZESS) 145 MCG CAPS capsule Take 145 mcg by mouth daily before breakfast.    . NITROSTAT 0.4 MG SL tablet DISSOLVE 1 TABLET UNDER TONGUE AS NEEDED FOR CHEST PAIN,MAY REPEAT IN5 MINUTES FOR 2 DOSES. 25 tablet 10  . omeprazole (PRILOSEC) 20 MG capsule Take 20 mg by mouth daily as needed (acid reflux).    . polyethylene glycol (MIRALAX / GLYCOLAX) packet Take 17 g by mouth daily as needed for mild constipation.     . Probiotic Product (PROBIOTIC DAILY PO) Take 1 capsule by mouth daily.    . RESTASIS 0.05 % ophthalmic emulsion Place 1 drop into both eyes 2 (two) times daily as needed (dry eyes).     . traMADol (ULTRAM) 50 MG tablet Take 50 mg by mouth every 6 (six) hours as needed for moderate pain.     No current facility-administered medications for this visit.     Allergies:   Penicillins;  Tizanidine; Ace inhibitors; Antihistamines, diphenhydramine-type; Clemastine fumarate; and Amiodarone   Social History:  The patient  reports that he quit smoking about 47 years ago. His smoking use included Cigarettes. He has a 60.00 pack-year smoking history. He has never used smokeless tobacco. He reports that he does not drink alcohol or use drugs.   Family History:  The patient's  family history includes Diabetes (age of onset: 58) in his mother; Heart disease (age of onset: 81) in his mother; Heart disease (age of onset: 13) in his father.    ROS:  Please see the history of present illness.  Otherwise, review of systems  is positive for back pain, muscle pain, easy bruising, constipation.  All other systems are reviewed and negative.    PHYSICAL EXAM: VS:  BP 120/70   Pulse 62   Ht 6\' 2"  (1.88 m)   Wt 187 lb (84.8 kg)   BMI 24.01 kg/m  , BMI Body mass index is 24.01 kg/m. GEN: Well nourished, well developed, elderly male in no acute distress  HEENT: normal  Neck: no JVD, no masses. No carotid bruits Cardiac: RRR with grade 2/6 systolic ejection murmur at the RUSB and 2/6 diastolic decrescendo murmur at the left lower sternal border           Respiratory:  clear to auscultation bilaterally, normal work of breathing GI: soft, nontender, nondistended, + BS MS: no deformity or atrophy  Ext: no pretibial edema, pedal pulses 2+= bilaterally Skin: warm and dry, no rash Neuro:  Strength and sensation are intact Psych: euthymic mood, full affect  EKG:  EKG is ordered today. The ekg ordered today shows atrial fibrillation with demand ventricular pacemaker, possible anterior infarct age undetermined, T-wave abnormality consider lateral ischemia.  Recent Labs: 09/01/2015: TSH 0.170 02/01/2016: Brain Natriuretic Peptide 335.7 07/05/2016: ALT 12; BUN 14; Creatinine, Ser 0.94; Hemoglobin 12.2; Platelets 199; Potassium 4.6; Sodium 127   Lipid Panel     Component Value Date/Time   CHOL 142  02/01/2016 1111   TRIG 72 02/01/2016 1111   HDL 51 02/01/2016 1111   CHOLHDL 2.8 02/01/2016 1111   VLDL 14 02/01/2016 1111   LDLCALC 77 02/01/2016 1111      Wt Readings from Last 3 Encounters:  08/02/16 187 lb (84.8 kg)  07/27/16 189 lb (85.7 kg)  05/08/16 182 lb 12.8 oz (82.9 kg)     Cardiac Studies Reviewed: 2D Echo 09-02-15: Left ventricle:  Diffuse hypokinesis septal and apical akinesis. The cavity size was severely dilated. Wall thickness was increased in a pattern of mild LVH. Systolic function was severely reduced. The estimated ejection fraction was in the range of 20% to 25%.  ------------------------------------------------------------------- Aortic valve:  Post TAVR with normal systolic gradients and mild perivalvular regurgitation.  Doppler:     VTI ratio of LVOT to aortic valve: 0.52. Peak velocity ratio of LVOT to aortic valve: 0.59. Mean velocity ratio of LVOT to aortic valve: 0.49.    Mean gradient (S): 7 mm Hg. Peak gradient (S): 11 mm Hg.  ------------------------------------------------------------------- Aorta:  Ascending aorta: The ascending aorta was mildly dilated.  ------------------------------------------------------------------- Mitral valve:   Mildly thickened leaflets .  Doppler:  There was trivial regurgitation.  ------------------------------------------------------------------- Left atrium:  The atrium was moderately dilated.  ------------------------------------------------------------------- Atrial septum:  No defect or patent foramen ovale was identified.   ------------------------------------------------------------------- Right ventricle:  The cavity size was normal. Wall thickness was normal. Systolic function was normal.  ------------------------------------------------------------------- Pulmonic valve:    Doppler:  There was trivial  regurgitation.  ------------------------------------------------------------------- Tricuspid valve:   Doppler:  There was mild regurgitation.  ------------------------------------------------------------------- Right atrium:  The atrium was normal in size.  ------------------------------------------------------------------- Pericardium:  The pericardium was normal in appearance.  ------------------------------------------------------------------- Systemic veins: Inferior vena cava: The vessel was normal in size. The respirophasic diameter changes were in the normal range (>= 50%), consistent with normal central venous pressure.   ASSESSMENT AND PLAN: 1. Chronic systolic heart failure New York Heart Association functional class II: Intolerant of aggressive medical therapy because of hypotension and marked fatigue. He will continue on carvedilol at his current dose. Long-standing severe ischemic cardiomyopathy noted.  2. Aortic valve disease status post TAVR: The patient has had stable moderate perivalvular regurgitation. Clinically he is stable. Will arrange an echo at the time of his follow-up visit.  3. Permanent atrial fibrillation: Continues on warfarin. Heart rate is well controlled. No bleeding problems reported.  4. CAD status post redo CABG, no angina: Maintained on aspirin and atorvastatin.  5. Hyperlipidemia: Patient takes a statin drug. Most recent lipids are reviewed.  Current medicines are reviewed with the patient today.  The patient does not have concerns regarding medicines.  Labs/ tests ordered today include:  No orders of the defined types were placed in this encounter.   Disposition:   FU 4 months  Signed, Sherren Mocha, MD  08/02/2016 12:16 PM    [facility] [place], [place], [place]  [address] Phone: [phone]; Fax: [phone]

## 2016-08-02 NOTE — Patient Instructions (Signed)
Medication Instructions:  Your physician recommends that you continue on your current medications as directed. Please refer to the Current Medication list given to you today.  Labwork: No new orders.   Testing/Procedures: Your physician has requested that you have an echocardiogram in 4 MONTHS. Echocardiography is a painless test that uses sound waves to create images of your heart. It provides your doctor with information about the size and shape of your heart and how well your heart's chambers and valves are working. This procedure takes approximately one hour. There are no restrictions for this procedure.  Follow-Up: Your physician recommends that you schedule a follow-up appointment in: 4 MONTHS with Dr Burt Knack   Any Other Special Instructions Will Be Listed Below (If Applicable).     If you need a refill on your cardiac medications before your next appointment, please call your pharmacy.

## 2016-08-04 ENCOUNTER — Ambulatory Visit (INDEPENDENT_AMBULATORY_CARE_PROVIDER_SITE_OTHER): Payer: Self-pay | Admitting: Pharmacist

## 2016-08-04 DIAGNOSIS — Z5181 Encounter for therapeutic drug level monitoring: Secondary | ICD-10-CM

## 2016-08-04 DIAGNOSIS — I482 Chronic atrial fibrillation, unspecified: Secondary | ICD-10-CM

## 2016-08-04 DIAGNOSIS — Z7901 Long term (current) use of anticoagulants: Secondary | ICD-10-CM | POA: Diagnosis not present

## 2016-08-04 DIAGNOSIS — M47816 Spondylosis without myelopathy or radiculopathy, lumbar region: Secondary | ICD-10-CM | POA: Diagnosis not present

## 2016-08-04 LAB — POCT INR: INR: 1.2

## 2016-08-07 ENCOUNTER — Other Ambulatory Visit: Payer: Self-pay | Admitting: Cardiovascular Disease

## 2016-08-09 DIAGNOSIS — H903 Sensorineural hearing loss, bilateral: Secondary | ICD-10-CM | POA: Diagnosis not present

## 2016-08-09 DIAGNOSIS — I739 Peripheral vascular disease, unspecified: Secondary | ICD-10-CM | POA: Diagnosis not present

## 2016-08-09 DIAGNOSIS — B351 Tinea unguium: Secondary | ICD-10-CM | POA: Diagnosis not present

## 2016-08-09 DIAGNOSIS — M79675 Pain in left toe(s): Secondary | ICD-10-CM | POA: Diagnosis not present

## 2016-08-09 DIAGNOSIS — H6123 Impacted cerumen, bilateral: Secondary | ICD-10-CM | POA: Diagnosis not present

## 2016-08-09 DIAGNOSIS — M79674 Pain in right toe(s): Secondary | ICD-10-CM | POA: Diagnosis not present

## 2016-08-10 ENCOUNTER — Ambulatory Visit (INDEPENDENT_AMBULATORY_CARE_PROVIDER_SITE_OTHER): Payer: Medicare Other | Admitting: Cardiovascular Disease

## 2016-08-10 DIAGNOSIS — I482 Chronic atrial fibrillation, unspecified: Secondary | ICD-10-CM

## 2016-08-10 DIAGNOSIS — Z5181 Encounter for therapeutic drug level monitoring: Secondary | ICD-10-CM

## 2016-08-10 LAB — POCT INR: INR: 1.4

## 2016-08-14 ENCOUNTER — Ambulatory Visit (INDEPENDENT_AMBULATORY_CARE_PROVIDER_SITE_OTHER): Payer: Medicare Other | Admitting: Pharmacist

## 2016-08-14 DIAGNOSIS — I482 Chronic atrial fibrillation, unspecified: Secondary | ICD-10-CM

## 2016-08-14 DIAGNOSIS — Z5181 Encounter for therapeutic drug level monitoring: Secondary | ICD-10-CM | POA: Diagnosis not present

## 2016-08-14 DIAGNOSIS — I255 Ischemic cardiomyopathy: Secondary | ICD-10-CM

## 2016-08-14 DIAGNOSIS — H60312 Diffuse otitis externa, left ear: Secondary | ICD-10-CM | POA: Diagnosis not present

## 2016-08-14 LAB — POCT INR: INR: 1.8

## 2016-08-15 DIAGNOSIS — H2513 Age-related nuclear cataract, bilateral: Secondary | ICD-10-CM | POA: Diagnosis not present

## 2016-08-17 ENCOUNTER — Ambulatory Visit (INDEPENDENT_AMBULATORY_CARE_PROVIDER_SITE_OTHER): Payer: Medicare Other

## 2016-08-17 DIAGNOSIS — Z5181 Encounter for therapeutic drug level monitoring: Secondary | ICD-10-CM

## 2016-08-17 DIAGNOSIS — I482 Chronic atrial fibrillation, unspecified: Secondary | ICD-10-CM

## 2016-08-17 LAB — POCT INR: INR: 2.9

## 2016-08-24 ENCOUNTER — Ambulatory Visit (INDEPENDENT_AMBULATORY_CARE_PROVIDER_SITE_OTHER): Payer: Medicare Other | Admitting: Cardiology

## 2016-08-24 DIAGNOSIS — Z5181 Encounter for therapeutic drug level monitoring: Secondary | ICD-10-CM

## 2016-08-24 DIAGNOSIS — I482 Chronic atrial fibrillation, unspecified: Secondary | ICD-10-CM

## 2016-08-24 LAB — POCT INR: INR: 2.3

## 2016-08-25 DIAGNOSIS — M25561 Pain in right knee: Secondary | ICD-10-CM | POA: Diagnosis not present

## 2016-08-25 DIAGNOSIS — M47816 Spondylosis without myelopathy or radiculopathy, lumbar region: Secondary | ICD-10-CM | POA: Diagnosis not present

## 2016-08-29 ENCOUNTER — Ambulatory Visit (INDEPENDENT_AMBULATORY_CARE_PROVIDER_SITE_OTHER): Payer: Medicare Other

## 2016-08-29 DIAGNOSIS — Z95810 Presence of automatic (implantable) cardiac defibrillator: Secondary | ICD-10-CM | POA: Diagnosis not present

## 2016-08-29 DIAGNOSIS — I5022 Chronic systolic (congestive) heart failure: Secondary | ICD-10-CM | POA: Diagnosis not present

## 2016-08-29 NOTE — Progress Notes (Signed)
EPIC Encounter for ICM Monitoring  Patient Name: [name] is a 83 y.o. male Date: 08/29/2016 Primary Care Physican: [name], MD Primary Cardiologist:Cooper Electrophysiologist: Caryl Comes Dry Weight:170lbs  Heart Failure questions reviewed, pt asymptomatic   Thoracic impedance normal  Prescribed Furosemide 20 mg 1 tablet as needed  Labs: 04/19/2016 Creatinine 0.9, BUN 13, Potassium 4.8, Sodium 131, EGFR 80.8 to 97.8 11/27/2017Creatinine 0.96, BUN 12, Potassium 4.4, Sodium 131 02/01/2016 Creatinine 1.00, BUN 9, Potassium 4.4, Sodium 131  10/25/2015 Creatinine 0.97, BUN 9, Potassium 4.1, Sodium 128  09/07/2015 Creatinine 0.99, BUN 11, Potassium 4.8, Sodium 131  09/02/2015 Creatinine 1.18, BUN 15, Potassium 4.5, Sodium 131  09/01/2015 Creatinine 1.10, BUN 13, Potassium 4.0, Sodium 139  07/19/2015 Creatinine 0.96, BUN 12, Potassium 4.6, Sodium 139  07/11/2015 Creatinine 0.91, BUN 11, Potassium 4.5, Sodium 127  03/28/2015 Creatinine 0.94, BUN 11, Potassium 4.5, Sodium 130   Recommendations: No changes.  Encouraged to call for fluid symptoms.  Follow-up plan: ICM clinic phone appointment on 09/29/2016.   Copy of ICM check sent to device physician.   3 month ICM trend: 08/29/2016   1 Year ICM trend:      Rosalene Billings, RN 08/29/2016 11:48 AM

## 2016-08-31 ENCOUNTER — Ambulatory Visit (INDEPENDENT_AMBULATORY_CARE_PROVIDER_SITE_OTHER): Payer: Medicare Other | Admitting: Cardiovascular Disease

## 2016-08-31 DIAGNOSIS — I482 Chronic atrial fibrillation, unspecified: Secondary | ICD-10-CM

## 2016-08-31 DIAGNOSIS — Z5181 Encounter for therapeutic drug level monitoring: Secondary | ICD-10-CM

## 2016-08-31 LAB — POCT INR: INR: 1.6

## 2016-09-07 ENCOUNTER — Ambulatory Visit (INDEPENDENT_AMBULATORY_CARE_PROVIDER_SITE_OTHER): Payer: Medicare Other

## 2016-09-07 DIAGNOSIS — I482 Chronic atrial fibrillation, unspecified: Secondary | ICD-10-CM

## 2016-09-07 DIAGNOSIS — Z5181 Encounter for therapeutic drug level monitoring: Secondary | ICD-10-CM

## 2016-09-07 LAB — POCT INR: INR: 2.5

## 2016-09-14 ENCOUNTER — Ambulatory Visit (INDEPENDENT_AMBULATORY_CARE_PROVIDER_SITE_OTHER): Payer: Medicare Other | Admitting: Pharmacist

## 2016-09-14 DIAGNOSIS — Z5181 Encounter for therapeutic drug level monitoring: Secondary | ICD-10-CM

## 2016-09-14 DIAGNOSIS — I482 Chronic atrial fibrillation, unspecified: Secondary | ICD-10-CM

## 2016-09-14 DIAGNOSIS — Z7901 Long term (current) use of anticoagulants: Secondary | ICD-10-CM | POA: Diagnosis not present

## 2016-09-14 LAB — POCT INR: INR: 2.5

## 2016-09-28 ENCOUNTER — Ambulatory Visit (INDEPENDENT_AMBULATORY_CARE_PROVIDER_SITE_OTHER): Payer: Medicare Other | Admitting: Cardiology

## 2016-09-28 DIAGNOSIS — Z5181 Encounter for therapeutic drug level monitoring: Secondary | ICD-10-CM

## 2016-09-28 DIAGNOSIS — I482 Chronic atrial fibrillation, unspecified: Secondary | ICD-10-CM

## 2016-09-28 LAB — POCT INR: INR: 2.4

## 2016-09-29 ENCOUNTER — Telehealth: Payer: Self-pay

## 2016-09-29 ENCOUNTER — Ambulatory Visit (INDEPENDENT_AMBULATORY_CARE_PROVIDER_SITE_OTHER): Payer: Medicare Other

## 2016-09-29 DIAGNOSIS — Z95810 Presence of automatic (implantable) cardiac defibrillator: Secondary | ICD-10-CM | POA: Diagnosis not present

## 2016-09-29 DIAGNOSIS — I5022 Chronic systolic (congestive) heart failure: Secondary | ICD-10-CM | POA: Diagnosis not present

## 2016-09-29 NOTE — Telephone Encounter (Signed)
Remote ICM transmission received.  Attempted patient call and person answering phone stated he was not available.    

## 2016-09-29 NOTE — Progress Notes (Signed)
EPIC Encounter for ICM Monitoring  Patient Name: [name] is a 83 y.o. male Date: 09/29/2016 Primary Care Physican: [name], MD Primary Cardiologist:Cooper Electrophysiologist: Caryl Comes Dry Weight:172lbs       Heart Failure questions reviewed, pt asymptomatic.   Thoracic impedance normal  Prescribed dosage: Furosemide 20 mg 1 tablet as needed  Labs: 04/19/2016 Creatinine 0.9, BUN 13, Potassium 4.8, Sodium 131, EGFR 80.8 to 97.8 11/27/2017Creatinine 0.96, BUN 12, Potassium 4.4, Sodium 131 02/01/2016 Creatinine 1.00, BUN 9, Potassium 4.4, Sodium 131  10/25/2015 Creatinine 0.97, BUN 9, Potassium 4.1, Sodium 128  09/07/2015 Creatinine 0.99, BUN 11, Potassium 4.8, Sodium 131  09/02/2015 Creatinine 1.18, BUN 15, Potassium 4.5, Sodium 131  09/01/2015 Creatinine 1.10, BUN 13, Potassium 4.0, Sodium 139  07/19/2015 Creatinine 0.96, BUN 12, Potassium 4.6, Sodium 139  07/11/2015 Creatinine 0.91, BUN 11, Potassium 4.5, Sodium 127  03/28/2015 Creatinine 0.94, BUN 11, Potassium 4.5, Sodium 130   Recommendations: No changes.  Encouraged to call for fluid symptoms.  Follow-up plan: ICM clinic phone appointment on 10/30/2016.  Office appointment scheduled 11/29/2016 with Dr. Burt Knack.  Copy of ICM check sent to device physician.   3 month ICM trend: 09/29/2016   1 Year ICM trend:      Rosalene Billings, RN 09/29/2016 8:53 AM

## 2016-10-11 DIAGNOSIS — L290 Pruritus ani: Secondary | ICD-10-CM | POA: Diagnosis not present

## 2016-10-11 DIAGNOSIS — K644 Residual hemorrhoidal skin tags: Secondary | ICD-10-CM | POA: Diagnosis not present

## 2016-10-12 ENCOUNTER — Ambulatory Visit (INDEPENDENT_AMBULATORY_CARE_PROVIDER_SITE_OTHER): Payer: Medicare Other | Admitting: Cardiology

## 2016-10-12 DIAGNOSIS — Z5181 Encounter for therapeutic drug level monitoring: Secondary | ICD-10-CM

## 2016-10-12 DIAGNOSIS — I482 Chronic atrial fibrillation, unspecified: Secondary | ICD-10-CM

## 2016-10-12 DIAGNOSIS — N390 Urinary tract infection, site not specified: Secondary | ICD-10-CM | POA: Diagnosis not present

## 2016-10-12 LAB — POCT INR: INR: 2.7

## 2016-10-26 ENCOUNTER — Ambulatory Visit (INDEPENDENT_AMBULATORY_CARE_PROVIDER_SITE_OTHER): Payer: Medicare Other | Admitting: Cardiovascular Disease

## 2016-10-26 DIAGNOSIS — I482 Chronic atrial fibrillation, unspecified: Secondary | ICD-10-CM

## 2016-10-26 DIAGNOSIS — Z5181 Encounter for therapeutic drug level monitoring: Secondary | ICD-10-CM

## 2016-10-26 LAB — POCT INR: INR: 2.4

## 2016-10-30 ENCOUNTER — Ambulatory Visit (INDEPENDENT_AMBULATORY_CARE_PROVIDER_SITE_OTHER): Payer: Medicare Other | Admitting: *Deleted

## 2016-10-30 DIAGNOSIS — I255 Ischemic cardiomyopathy: Secondary | ICD-10-CM | POA: Diagnosis not present

## 2016-10-30 DIAGNOSIS — Z95810 Presence of automatic (implantable) cardiac defibrillator: Secondary | ICD-10-CM | POA: Diagnosis not present

## 2016-10-30 DIAGNOSIS — I5022 Chronic systolic (congestive) heart failure: Secondary | ICD-10-CM | POA: Diagnosis not present

## 2016-10-30 NOTE — Progress Notes (Signed)
ICD Remote Transmission  

## 2016-10-30 NOTE — Progress Notes (Signed)
EPIC Encounter for ICM Monitoring  Patient Name: [name] is a 83 y.o. male Date: 10/30/2016 Primary Care Physican: [name], MD Primary Cardiologist:Cooper Electrophysiologist: Caryl Comes Dry Weight:171lbs  Clinical Status Since 29-Sep-2016 VT-NS (>4 beats, >150 bpm) 4      Heart Failure questions reviewed, pt asymptomatic.   Thoracic impedance normal.  Prescribed dosage: Furosemide 20 mg 1 tablet as needed  Labs: 04/19/2016 Creatinine 0.9, BUN 13, Potassium 4.8, Sodium 131, EGFR 80.8 to 97.8 11/27/2017Creatinine 0.96, BUN 12, Potassium 4.4, Sodium 131 02/01/2016 Creatinine 1.00, BUN 9, Potassium 4.4, Sodium 131  10/25/2015 Creatinine 0.97, BUN 9, Potassium 4.1, Sodium 128  09/07/2015 Creatinine 0.99, BUN 11, Potassium 4.8, Sodium 131  09/02/2015 Creatinine 1.18, BUN 15, Potassium 4.5, Sodium 131  09/01/2015 Creatinine 1.10, BUN 13, Potassium 4.0, Sodium 139  07/19/2015 Creatinine 0.96, BUN 12, Potassium 4.6, Sodium 139  07/11/2015 Creatinine 0.91, BUN 11, Potassium 4.5, Sodium 127  03/28/2015 Creatinine 0.94, BUN 11, Potassium 4.5, Sodium 130  Recommendations: No changes.    Encouraged to call for fluid symptoms.  Follow-up plan: ICM clinic phone appointment on 11/30/2016.    Copy of ICM check sent to device physician.   3 month ICM trend: 10/30/2016   1 Year ICM trend:      Rosalene Billings, RN 10/30/2016 1:23 PM

## 2016-11-01 DIAGNOSIS — M79674 Pain in right toe(s): Secondary | ICD-10-CM | POA: Diagnosis not present

## 2016-11-01 DIAGNOSIS — I739 Peripheral vascular disease, unspecified: Secondary | ICD-10-CM | POA: Diagnosis not present

## 2016-11-01 DIAGNOSIS — M79675 Pain in left toe(s): Secondary | ICD-10-CM | POA: Diagnosis not present

## 2016-11-01 DIAGNOSIS — B351 Tinea unguium: Secondary | ICD-10-CM | POA: Diagnosis not present

## 2016-11-01 LAB — CUP PACEART REMOTE DEVICE CHECK
Brady Statistic RV Percent Paced: 4.28 %
HIGH POWER IMPEDANCE MEASURED VALUE: 43 Ohm
HighPow Impedance: 56 Ohm
Implantable Lead Implant Date: 20021220
Implantable Pulse Generator Implant Date: 20141201
Lead Channel Impedance Value: 456 Ohm
Lead Channel Pacing Threshold Amplitude: 0.875 V
Lead Channel Pacing Threshold Pulse Width: 0.4 ms
Lead Channel Setting Pacing Pulse Width: 0.4 ms
Lead Channel Setting Sensing Sensitivity: 0.3 mV
MDC IDC LEAD LOCATION: 753860
MDC IDC MSMT BATTERY REMAINING LONGEVITY: 106 mo
MDC IDC MSMT BATTERY VOLTAGE: 3.01 V
MDC IDC MSMT LEADCHNL RV IMPEDANCE VALUE: 532 Ohm
MDC IDC MSMT LEADCHNL RV SENSING INTR AMPL: 17.875 mV
MDC IDC MSMT LEADCHNL RV SENSING INTR AMPL: 17.875 mV
MDC IDC SESS DTM: 20180820062705
MDC IDC SET LEADCHNL RV PACING AMPLITUDE: 2 V

## 2016-11-02 DIAGNOSIS — J309 Allergic rhinitis, unspecified: Secondary | ICD-10-CM | POA: Diagnosis not present

## 2016-11-02 DIAGNOSIS — R05 Cough: Secondary | ICD-10-CM | POA: Diagnosis not present

## 2016-11-02 DIAGNOSIS — K5909 Other constipation: Secondary | ICD-10-CM | POA: Diagnosis not present

## 2016-11-02 DIAGNOSIS — Z6824 Body mass index (BMI) 24.0-24.9, adult: Secondary | ICD-10-CM | POA: Diagnosis not present

## 2016-11-02 DIAGNOSIS — K219 Gastro-esophageal reflux disease without esophagitis: Secondary | ICD-10-CM | POA: Diagnosis not present

## 2016-11-02 DIAGNOSIS — K649 Unspecified hemorrhoids: Secondary | ICD-10-CM | POA: Diagnosis not present

## 2016-11-02 DIAGNOSIS — K5900 Constipation, unspecified: Secondary | ICD-10-CM | POA: Diagnosis not present

## 2016-11-03 ENCOUNTER — Encounter: Payer: Self-pay | Admitting: Pharmacist

## 2016-11-09 ENCOUNTER — Ambulatory Visit (INDEPENDENT_AMBULATORY_CARE_PROVIDER_SITE_OTHER): Payer: Medicare Other

## 2016-11-09 DIAGNOSIS — Z7901 Long term (current) use of anticoagulants: Secondary | ICD-10-CM | POA: Diagnosis not present

## 2016-11-09 DIAGNOSIS — I482 Chronic atrial fibrillation, unspecified: Secondary | ICD-10-CM

## 2016-11-09 DIAGNOSIS — Z5181 Encounter for therapeutic drug level monitoring: Secondary | ICD-10-CM

## 2016-11-09 LAB — POCT INR: INR: 2.9

## 2016-11-10 ENCOUNTER — Encounter: Payer: Self-pay | Admitting: Cardiology

## 2016-11-15 DIAGNOSIS — M25561 Pain in right knee: Secondary | ICD-10-CM | POA: Diagnosis not present

## 2016-11-17 ENCOUNTER — Other Ambulatory Visit: Payer: Self-pay | Admitting: Cardiovascular Disease

## 2016-11-20 ENCOUNTER — Other Ambulatory Visit: Payer: Self-pay

## 2016-11-20 ENCOUNTER — Ambulatory Visit (HOSPITAL_COMMUNITY): Payer: Medicare Other | Attending: Cardiovascular Disease

## 2016-11-20 DIAGNOSIS — I359 Nonrheumatic aortic valve disorder, unspecified: Secondary | ICD-10-CM | POA: Diagnosis not present

## 2016-11-20 DIAGNOSIS — I482 Chronic atrial fibrillation, unspecified: Secondary | ICD-10-CM

## 2016-11-20 DIAGNOSIS — I420 Dilated cardiomyopathy: Secondary | ICD-10-CM | POA: Insufficient documentation

## 2016-11-20 DIAGNOSIS — I5022 Chronic systolic (congestive) heart failure: Secondary | ICD-10-CM | POA: Diagnosis not present

## 2016-11-20 DIAGNOSIS — I082 Rheumatic disorders of both aortic and tricuspid valves: Secondary | ICD-10-CM | POA: Insufficient documentation

## 2016-11-20 DIAGNOSIS — Z952 Presence of prosthetic heart valve: Secondary | ICD-10-CM | POA: Diagnosis not present

## 2016-11-20 DIAGNOSIS — I422 Other hypertrophic cardiomyopathy: Secondary | ICD-10-CM | POA: Diagnosis present

## 2016-11-23 ENCOUNTER — Ambulatory Visit (INDEPENDENT_AMBULATORY_CARE_PROVIDER_SITE_OTHER): Payer: Medicare Other | Admitting: Internal Medicine

## 2016-11-23 DIAGNOSIS — I482 Chronic atrial fibrillation, unspecified: Secondary | ICD-10-CM

## 2016-11-23 DIAGNOSIS — Z5181 Encounter for therapeutic drug level monitoring: Secondary | ICD-10-CM

## 2016-11-23 LAB — POCT INR: INR: 2.3

## 2016-11-24 ENCOUNTER — Encounter: Payer: Self-pay | Admitting: Cardiology

## 2016-11-29 ENCOUNTER — Encounter: Payer: Self-pay | Admitting: Cardiovascular Disease

## 2016-11-29 ENCOUNTER — Ambulatory Visit (INDEPENDENT_AMBULATORY_CARE_PROVIDER_SITE_OTHER): Payer: Medicare Other | Admitting: Cardiovascular Disease

## 2016-11-29 VITALS — BP 118/70 | HR 51 | Ht 74.0 in | Wt 184.0 lb

## 2016-11-29 DIAGNOSIS — I5022 Chronic systolic (congestive) heart failure: Secondary | ICD-10-CM

## 2016-11-29 DIAGNOSIS — I255 Ischemic cardiomyopathy: Secondary | ICD-10-CM | POA: Diagnosis not present

## 2016-11-29 DIAGNOSIS — Z952 Presence of prosthetic heart valve: Secondary | ICD-10-CM | POA: Diagnosis not present

## 2016-11-29 NOTE — Progress Notes (Signed)
Cardiology Office Note Date:  11/29/2016   ID:  [name], DOB [birth year], MRN [id number]  PCP:  [name], MD  Cardiologist:  Sherren Mocha, MD    Chief Complaint  Patient presents with  . Follow-up    CHF     History of Present Illness: [name] is a 83 y.o. male who presents for follow-up of chronic systolic heart failure. The patient has an extensive cardiac history. He has coronary artery disease with initial CABG surgery in 1979 and redo CABG in 1991. He's had a long-standing severe ischemic cardiomyopathy with LVEF less than 30%. The patient is undergone ICD implantation and he is followed regularly by Dr. Caryl Comes. He underwent TAVR the [facility] clinic in 2012 and he has mild-moderate paravalvular aortic insufficiency which has been followed with serial echo studies. He's developed permanent atrial fibrillation and is maintained on long-term anticoagulation. We have not been able to treat him with much medication for his heart failure because of multiple intolerances.  He had become debilitated from chronic back pain, but underwent an injection a few months ago and is now doing remarkably well in this regard. He still had some problems with his knee. He is not very active but he denies chest pain, chest pressure, or shortness of breath with activity. He denies orthopnea or PND. He occasionally has mild leg swelling on the left.   Past Medical History:  Diagnosis Date  . AF (atrial fibrillation) ([place])    Has not tolerated amiodarone in the past. Amiodarone was stopped in September of 2010 due to side effects  . Aortic stenosis, severe    WITH PERCUTANEOUS AORTIC VALVE (TAVI) IN March 2012 at the [facility]  . Cervical myelopathy ([place])   . CHF (congestive heart failure) ([place])   . Chronic anticoagulation    on coumadin  . Coronary artery disease   . Diverticulitis    CURRENTLY CONTROLLED WITH NO EVIDENCE OF RECURRENT INFECTION  . Esophageal stricture    WITH  DILATATION  . GERD (gastroesophageal reflux disease)   . Heart murmur   . Hyperlipidemia   . Ischemic cardiomyopathy 05/2010   Has EF of 25%  . MI (myocardial infarction) ([place]) 1974, 1978  . NSVT (nonsustained ventricular tachycardia) ([place])   . Osteoarthritis    RIGHT KNEE  . Osteoporosis   . Prostate cancer ([place])   . Pulmonary embolism ([place])   . Stroke [facility])    3 strokes    Past Surgical History:  Procedure Laterality Date  . AORTIC VALVE REPLACEMENT     Percutaneous AVR in March 2012 at the [facility]  . BACK SURGERY    . CARDIAC CATHETERIZATION  2010   SEVERE LV DYSFUNCTION WITH ESTIMATED EJECTION FRACTION OF 25%  . CARDIOVERSION  02/16/2011   Procedure: CARDIOVERSION;  Surgeon: Carlena Bjornstad, MD;  Location: [facility];  Service: Cardiovascular;  Laterality: N/A;  . CHOLECYSTECTOMY    . CORONARY ARTERY BYPASS GRAFT  1979  . CORONARY ARTERY BYPASS GRAFT  1991   REDO SURGERY  . ICD  Feb 2003; 02/2013   gen change 02-11-2013 by Dr Caryl Comes  . IMPLANTABLE CARDIOVERTER DEFIBRILLATOR (ICD) GENERATOR CHANGE N/A 02/10/2013   Procedure: ICD GENERATOR CHANGE;  Surgeon: Deboraha Sprang, MD;  Location[facility] C[facility] LAB;  Service: Cardiovascular;  Laterality: N/A;  . SHOULDER SURGERY      Current Outpatient Prescriptions  Medication Sig Dispense Refill  . acetaminophen (TYLENOL) 500 MG tablet Take 1,000 mg by  mouth every 6 (six) hours as needed for mild pain.     Marland Kitchen albuterol (PROVENTIL HFA;VENTOLIN HFA) 108 (90 Base) MCG/ACT inhaler Inhale 1-2 puffs into the lungs every 6 (six) hours as needed for wheezing or shortness of breath.    . alfuzosin (UROXATRAL) 10 MG 24 hr tablet Take 10 mg by mouth at bedtime.     Marland Kitchen aspirin 81 MG tablet Take 81 mg by mouth daily.    Marland Kitchen atorvastatin (LIPITOR) 10 MG tablet Take 10 mg by mouth every other day.    . benzonatate (TESSALON) 100 MG capsule Take 100 mg by mouth 3 (three) times daily as needed for cough.    . Budesonide-Formoterol Fumarate (SYMBICORT IN)  Inhale 1 puff into the lungs 2 (two) times daily.    . carvedilol (COREG) 6.25 MG tablet TAKE 1 TABLET TWICE DAILY WITH A MEAL. 180 tablet 2  . cholecalciferol (VITAMIN D) 1000 UNITS tablet Take 1,000 Units by mouth 2 (two) times daily.    Marland Kitchen COUMADIN 5 MG tablet TAKE AS DIRECTED BY COUMADIN CLINIC. 100 tablet 0  . dicyclomine (BENTYL) 20 MG tablet Take 1 tablet (20 mg total) by mouth 2 (two) times daily as needed for spasms. 20 tablet 0  . enoxaparin (LOVENOX) 80 MG/0.8ML injection Inject 0.8 mLs (80 mg total) into the skin every 12 (twelve) hours. 20 Syringe 1  . furosemide (LASIX) 20 MG tablet TAKE (1) TABLET DAILY AS NEEDED FOR SWELLING. 30 tablet 0  . ipratropium (ATROVENT) 0.03 % nasal spray Place 2 sprays into both nostrils 2 (two) times daily.    Marland Kitchen levothyroxine (SYNTHROID) 125 MCG tablet Take 1 tablet (125 mcg total) by mouth daily before breakfast. 30 tablet 0  . linaclotide (LINZESS) 145 MCG CAPS capsule Take 145 mcg by mouth daily before breakfast.    . NITROSTAT 0.4 MG SL tablet DISSOLVE 1 TABLET UNDER TONGUE AS NEEDED FOR CHEST PAIN,MAY REPEAT IN5 MINUTES FOR 2 DOSES. 25 tablet 10  . omeprazole (PRILOSEC) 20 MG capsule Take 20 mg by mouth daily as needed (acid reflux).    . polyethylene glycol (MIRALAX / GLYCOLAX) packet Take 17 g by mouth daily as needed for mild constipation.     . Probiotic Product (PROBIOTIC DAILY PO) Take 1 capsule by mouth daily.    . RESTASIS 0.05 % ophthalmic emulsion Place 1 drop into both eyes 2 (two) times daily as needed (dry eyes).     . traMADol (ULTRAM) 50 MG tablet Take 50 mg by mouth every 6 (six) hours as needed for moderate pain.     No current facility-administered medications for this visit.     Allergies:   Penicillins; Tizanidine; Ace inhibitors; Antihistamines, diphenhydramine-type; Clemastine fumarate; and Amiodarone   Social History:  The patient  reports that he quit smoking about 47 years ago. His smoking use included Cigarettes. He has  a 60.00 pack-year smoking history. He has never used smokeless tobacco. He reports that he does not drink alcohol or use drugs.   Family History:  The patient's family history includes Diabetes (age of onset: 59) in his mother; Heart disease (age of onset: 42) in his mother; Heart disease (age of onset: 86) in his father.   ROS:  Please see the history of present illness.  All other systems are reviewed and negative.   PHYSICAL EXAM: VS:  BP 118/70   Pulse (!) 51   Ht 6\' 2"  (1.88 m)   Wt 83.5 kg (184 lb)   SpO2  96%   BMI 23.62 kg/m  , BMI Body mass index is 23.62 kg/m. GEN: Elderly male, in no acute distress  HEENT: normal  Neck: no JVD, no masses. No carotid bruits Cardiac: RRR with soft systolic ejection murmur at the right upper sternal border and soft diastolic decrescendo murmur at the left lower sternal border               Respiratory:  clear to auscultation bilaterally, normal work of breathing GI: soft, nontender, nondistended, + BS MS: no deformity or atrophy  Ext: no pretibial edema Skin: warm and dry, no rash Neuro:  Strength and sensation are intact Psych: euthymic mood, full affect  EKG:  EKG is not ordered today.  Recent Labs: 02/01/2016: Brain Natriuretic Peptide 335.7 07/05/2016: ALT 12; BUN 14; Creatinine, Ser 0.94; Hemoglobin 12.2; Platelets 199; Potassium 4.6; Sodium 127   Lipid Panel     Component Value Date/Time   CHOL 142 02/01/2016 1111   TRIG 72 02/01/2016 1111   HDL 51 02/01/2016 1111   CHOLHDL 2.8 02/01/2016 1111   VLDL 14 02/01/2016 1111   LDLCALC 77 02/01/2016 1111      Wt Readings from Last 3 Encounters:  11/29/16 83.5 kg (184 lb)  08/02/16 84.8 kg (187 lb)  07/27/16 85.7 kg (189 lb)     Cardiac Studies Reviewed: 2D Echo 11/20/2016: Study Conclusions  - Left ventricle: The cavity size was normal. There was mild focal   basal hypertrophy of the septum. Systolic function was severely   reduced. The estimated ejection fraction was in  the range of 20%   to 25%. Diffuse hypokinesis. Akinesis of the   mid-apicalinferoseptal and apical myocardium. The study is not   technically sufficient to allow evaluation of LV diastolic   function. - Aortic valve: A TAVR bioprosthesis was present. Transvalvular   velocity was within the normal range. There was no stenosis.   There was mild regurgitation. - Aorta: Ascending aortic diameter: 37 mm (S). - Ascending aorta: The ascending aorta was mildly dilated. - Mitral valve: Transvalvular velocity was within the normal range.   There was no evidence for stenosis. There was trivial   regurgitation. - Left atrium: The atrium was severely dilated. - Right ventricle: The cavity size was normal. Wall thickness was   normal. Systolic function was normal. - Tricuspid valve: There was mild regurgitation. - Pulmonary arteries: Systolic pressure could not be accurately   estimated.  ASSESSMENT AND PLAN: 1.  Chronic systolic heart failure with New York Heart Association functional class II symptoms: He continues on carvedilol. Unable to tolerate an ACE/ARB because of hypotension and frailty. Continue same therapy. Echocardiogram is reviewed as stable findings of severe LV dysfunction and mild paravalvular regurgitation involving his TAVR valve  2. Aortic valve disease status post TAVR: Stable findings on recent echo. He appears to be clinically stable.  3. Permanent atrial fibrillation: The patient continues on warfarin. His heart rate is well controlled and he has had no bleeding problems  4. Coronary artery disease: No angina. Patient is undergone redo CABG. He continues on a low-dose of aspirin and atorvastatin.  Current medicines are reviewed with the patient today.  The patient does not have concerns regarding medicines.  Labs/ tests ordered today include:  No orders of the defined types were placed in this encounter.   Disposition:   FU 4 months  Signed, Sherren Mocha, MD    11/29/2016 5:25 PM    [place] [phone]  [address], [place], [place]  [address] Phone: [phone]; Fax: [phone]

## 2016-11-29 NOTE — Patient Instructions (Signed)
Medication Instructions:  Your provider recommends that you continue on your current medications as directed. Please refer to the Current Medication list given to you today.    Labwork: None  Testing/Procedures: None  Follow-Up: Your provider wants you to follow-up in: 4 months with Dr. Burt Knack. You will receive a reminder letter in the mail two months in advance. If you don't receive a letter, please call our office to schedule the follow-up appointment.    Any Other Special Instructions Will Be Listed Below (If Applicable).     If you need a refill on your cardiac medications before your next appointment, please call your pharmacy.

## 2016-11-30 ENCOUNTER — Ambulatory Visit (INDEPENDENT_AMBULATORY_CARE_PROVIDER_SITE_OTHER): Payer: Medicare Other

## 2016-11-30 DIAGNOSIS — Z95810 Presence of automatic (implantable) cardiac defibrillator: Secondary | ICD-10-CM | POA: Diagnosis not present

## 2016-11-30 DIAGNOSIS — I5022 Chronic systolic (congestive) heart failure: Secondary | ICD-10-CM

## 2016-12-01 ENCOUNTER — Telehealth: Payer: Self-pay

## 2016-12-01 NOTE — Progress Notes (Signed)
EPIC Encounter for ICM Monitoring  Patient Name: [name] is a 83 y.o. male Date: 12/01/2016 Primary Care Physican: Russo, Jimmey, MD Primary Cardiologist:Cooper Electrophysiologist: Klein Dry Weight:Last weight 171lbs       Attempted call to patient and unable to reach.    Transmission reviewed.    Thoracic impedance normal.  Prescribed dosage: Furosemide 20 mg 1 tablet as needed  Labs: 04/19/2016 Creatinine 0.9, BUN 13, Potassium 4.8, Sodium 131, EGFR 80.8 to 97.8 11/27/2017Creatinine 0.96, BUN 12, Potassium 4.4, Sodium 131 02/01/2016 Creatinine 1.00, BUN 9, Potassium 4.4, Sodium 131  10/25/2015 Creatinine 0.97, BUN 9, Potassium 4.1, Sodium 128  09/07/2015 Creatinine 0.99, BUN 11, Potassium 4.8, Sodium 131  09/02/2015 Creatinine 1.18, BUN 15, Potassium 4.5, Sodium 131  09/01/2015 Creatinine 1.10, BUN 13, Potassium 4.0, Sodium 139  07/19/2015 Creatinine 0.96, BUN 12, Potassium 4.6, Sodium 139  07/11/2015 Creatinine 0.91, BUN 11, Potassium 4.5, Sodium 127  03/28/2015 Creatinine 0.94, BUN 11, Potassium 4.5, Sodium 130  Recommendations: NONE - Unable to reach patient   Follow-up plan: ICM clinic phone appointment on 01/01/2017.    Copy of ICM check sent to Dr. Klein.   3 month ICM trend: 11/30/2016   1 Year ICM trend:       S , RN 12/01/2016 11:05 AM    

## 2016-12-01 NOTE — Telephone Encounter (Signed)
Remote ICM transmission received.  Attempted call to patient and caregiver reported he was not available.

## 2016-12-05 ENCOUNTER — Other Ambulatory Visit: Payer: Self-pay | Admitting: Cardiovascular Disease

## 2016-12-05 DIAGNOSIS — Z6823 Body mass index (BMI) 23.0-23.9, adult: Secondary | ICD-10-CM | POA: Diagnosis not present

## 2016-12-05 DIAGNOSIS — R0781 Pleurodynia: Secondary | ICD-10-CM | POA: Diagnosis not present

## 2016-12-05 DIAGNOSIS — R269 Unspecified abnormalities of gait and mobility: Secondary | ICD-10-CM | POA: Diagnosis not present

## 2016-12-11 ENCOUNTER — Telehealth: Payer: Self-pay | Admitting: *Deleted

## 2016-12-11 ENCOUNTER — Ambulatory Visit (INDEPENDENT_AMBULATORY_CARE_PROVIDER_SITE_OTHER): Payer: Medicare Other | Admitting: Cardiovascular Disease

## 2016-12-11 DIAGNOSIS — Z5181 Encounter for therapeutic drug level monitoring: Secondary | ICD-10-CM | POA: Diagnosis not present

## 2016-12-11 DIAGNOSIS — I482 Chronic atrial fibrillation, unspecified: Secondary | ICD-10-CM

## 2016-12-11 LAB — POCT INR: INR: 3.9

## 2016-12-11 NOTE — Telephone Encounter (Signed)
Spoke with pt's daughter and she states there is bright blood present in foley catheter She is aware of results of INR today and that he is holding coumadin today.  Informed that if he can tolerate to eat some dark leafy greens today and to call PCP regarding this as the catheter may need to be irrigated . She states she has called them and had to leave message Instructed to call back if bleeding continues or if they have any other questions and she states understanding

## 2016-12-15 DIAGNOSIS — I482 Chronic atrial fibrillation: Secondary | ICD-10-CM | POA: Diagnosis not present

## 2016-12-15 DIAGNOSIS — R627 Adult failure to thrive: Secondary | ICD-10-CM | POA: Diagnosis not present

## 2016-12-15 DIAGNOSIS — R2689 Other abnormalities of gait and mobility: Secondary | ICD-10-CM | POA: Diagnosis not present

## 2016-12-15 DIAGNOSIS — Z952 Presence of prosthetic heart valve: Secondary | ICD-10-CM | POA: Diagnosis not present

## 2016-12-15 DIAGNOSIS — R3911 Hesitancy of micturition: Secondary | ICD-10-CM | POA: Diagnosis not present

## 2016-12-15 DIAGNOSIS — Z6823 Body mass index (BMI) 23.0-23.9, adult: Secondary | ICD-10-CM | POA: Diagnosis not present

## 2016-12-15 DIAGNOSIS — I5022 Chronic systolic (congestive) heart failure: Secondary | ICD-10-CM | POA: Diagnosis not present

## 2016-12-15 DIAGNOSIS — Z7901 Long term (current) use of anticoagulants: Secondary | ICD-10-CM | POA: Diagnosis not present

## 2016-12-15 DIAGNOSIS — R0781 Pleurodynia: Secondary | ICD-10-CM | POA: Diagnosis not present

## 2016-12-15 DIAGNOSIS — K5909 Other constipation: Secondary | ICD-10-CM | POA: Diagnosis not present

## 2016-12-15 DIAGNOSIS — E038 Other specified hypothyroidism: Secondary | ICD-10-CM | POA: Diagnosis not present

## 2016-12-15 DIAGNOSIS — Z23 Encounter for immunization: Secondary | ICD-10-CM | POA: Diagnosis not present

## 2016-12-21 ENCOUNTER — Ambulatory Visit (INDEPENDENT_AMBULATORY_CARE_PROVIDER_SITE_OTHER): Payer: Medicare Other | Admitting: Interventional Cardiology

## 2016-12-21 DIAGNOSIS — I482 Chronic atrial fibrillation, unspecified: Secondary | ICD-10-CM

## 2016-12-21 DIAGNOSIS — Z7901 Long term (current) use of anticoagulants: Secondary | ICD-10-CM | POA: Diagnosis not present

## 2016-12-21 DIAGNOSIS — Z5181 Encounter for therapeutic drug level monitoring: Secondary | ICD-10-CM

## 2016-12-21 LAB — POCT INR: INR: 2.6

## 2017-01-01 ENCOUNTER — Ambulatory Visit (INDEPENDENT_AMBULATORY_CARE_PROVIDER_SITE_OTHER): Payer: Medicare Other

## 2017-01-01 DIAGNOSIS — I5022 Chronic systolic (congestive) heart failure: Secondary | ICD-10-CM

## 2017-01-01 DIAGNOSIS — Z95810 Presence of automatic (implantable) cardiac defibrillator: Secondary | ICD-10-CM

## 2017-01-01 NOTE — Progress Notes (Signed)
EPIC Encounter for ICM Monitoring  Patient Name: [name] is a 83 y.o. male Date: 01/01/2017 Primary Care Physican: [name], MD Primary Cardiologist:Cooper Electrophysiologist: Faustino Congress Weight:unknown      Heart Failure questions reviewed, pt asymptomatic.   Thoracic impedance normal.  Prescribed dosage: Furosemide 20 mg 1 tablet as needed  Labs: 07/05/2016 Creatinine 0.94, BUN 14, Potassium 4.6, Sodium 127, EGFR >60 04/19/2016 Creatinine 0.9, BUN 13, Potassium 4.8, Sodium 131, EGFR 80.8 to 97.8 11/27/2017Creatinine 0.96, BUN 12, Potassium 4.4, Sodium 131 02/01/2016 Creatinine 1.00, BUN 9, Potassium 4.4, Sodium 131  10/25/2015 Creatinine 0.97, BUN 9, Potassium 4.1, Sodium 128  09/07/2015 Creatinine 0.99, BUN 11, Potassium 4.8, Sodium 131  09/02/2015 Creatinine 1.18, BUN 15, Potassium 4.5, Sodium 131  09/01/2015 Creatinine 1.10, BUN 13, Potassium 4.0, Sodium 139  07/19/2015 Creatinine 0.96, BUN 12, Potassium 4.6, Sodium 139  07/11/2015 Creatinine 0.91, BUN 11, Potassium 4.5, Sodium 127  03/28/2015 Creatinine 0.94, BUN 11, Potassium 4.5, Sodium 130  Recommendations: No changes.    Encouraged to call for fluid symptoms.  Follow-up plan: ICM clinic phone appointment on 02/06/2017.   Copy of ICM check sent to Dr. Caryl Comes.   3 month ICM trend: 01/01/2017   1 Year ICM trend:      Rosalene Billings, RN 01/01/2017 1:52 PM

## 2017-01-04 ENCOUNTER — Ambulatory Visit (INDEPENDENT_AMBULATORY_CARE_PROVIDER_SITE_OTHER): Payer: Medicare Other | Admitting: Cardiovascular Disease

## 2017-01-04 DIAGNOSIS — M79675 Pain in left toe(s): Secondary | ICD-10-CM | POA: Diagnosis not present

## 2017-01-04 DIAGNOSIS — Z85828 Personal history of other malignant neoplasm of skin: Secondary | ICD-10-CM | POA: Diagnosis not present

## 2017-01-04 DIAGNOSIS — M79674 Pain in right toe(s): Secondary | ICD-10-CM | POA: Diagnosis not present

## 2017-01-04 DIAGNOSIS — Z5181 Encounter for therapeutic drug level monitoring: Secondary | ICD-10-CM

## 2017-01-04 DIAGNOSIS — L570 Actinic keratosis: Secondary | ICD-10-CM | POA: Diagnosis not present

## 2017-01-04 DIAGNOSIS — D692 Other nonthrombocytopenic purpura: Secondary | ICD-10-CM | POA: Diagnosis not present

## 2017-01-04 DIAGNOSIS — I739 Peripheral vascular disease, unspecified: Secondary | ICD-10-CM | POA: Diagnosis not present

## 2017-01-04 DIAGNOSIS — D1801 Hemangioma of skin and subcutaneous tissue: Secondary | ICD-10-CM | POA: Diagnosis not present

## 2017-01-04 DIAGNOSIS — I482 Chronic atrial fibrillation, unspecified: Secondary | ICD-10-CM

## 2017-01-04 DIAGNOSIS — B351 Tinea unguium: Secondary | ICD-10-CM | POA: Diagnosis not present

## 2017-01-04 DIAGNOSIS — L578 Other skin changes due to chronic exposure to nonionizing radiation: Secondary | ICD-10-CM | POA: Diagnosis not present

## 2017-01-04 DIAGNOSIS — D2271 Melanocytic nevi of right lower limb, including hip: Secondary | ICD-10-CM | POA: Diagnosis not present

## 2017-01-04 DIAGNOSIS — L821 Other seborrheic keratosis: Secondary | ICD-10-CM | POA: Diagnosis not present

## 2017-01-04 DIAGNOSIS — L814 Other melanin hyperpigmentation: Secondary | ICD-10-CM | POA: Diagnosis not present

## 2017-01-04 LAB — POCT INR: INR: 2

## 2017-01-10 DIAGNOSIS — M1711 Unilateral primary osteoarthritis, right knee: Secondary | ICD-10-CM | POA: Diagnosis not present

## 2017-01-18 ENCOUNTER — Ambulatory Visit (INDEPENDENT_AMBULATORY_CARE_PROVIDER_SITE_OTHER): Payer: Medicare Other | Admitting: Pharmacist

## 2017-01-18 DIAGNOSIS — I482 Chronic atrial fibrillation, unspecified: Secondary | ICD-10-CM

## 2017-01-18 DIAGNOSIS — M1711 Unilateral primary osteoarthritis, right knee: Secondary | ICD-10-CM | POA: Diagnosis not present

## 2017-01-18 DIAGNOSIS — Z5181 Encounter for therapeutic drug level monitoring: Secondary | ICD-10-CM

## 2017-01-18 LAB — POCT INR: INR: 3.9

## 2017-01-20 ENCOUNTER — Encounter (HOSPITAL_COMMUNITY): Payer: Self-pay

## 2017-01-20 ENCOUNTER — Inpatient Hospital Stay (HOSPITAL_COMMUNITY)
Admission: EM | Admit: 2017-01-20 | Discharge: 2017-01-22 | DRG: 690 | Disposition: A | Discharge status: Home Health | Payer: Medicare Other | Attending: Internal Medicine | Admitting: Internal Medicine

## 2017-01-20 DIAGNOSIS — Z952 Presence of prosthetic heart valve: Secondary | ICD-10-CM

## 2017-01-20 DIAGNOSIS — L89151 Pressure ulcer of sacral region, stage 1: Secondary | ICD-10-CM | POA: Diagnosis present

## 2017-01-20 DIAGNOSIS — Z79890 Hormone replacement therapy (postmenopausal): Secondary | ICD-10-CM

## 2017-01-20 DIAGNOSIS — L8990 Pressure ulcer of unspecified site, unspecified stage: Secondary | ICD-10-CM

## 2017-01-20 DIAGNOSIS — K648 Other hemorrhoids: Secondary | ICD-10-CM | POA: Diagnosis present

## 2017-01-20 DIAGNOSIS — I5022 Chronic systolic (congestive) heart failure: Secondary | ICD-10-CM | POA: Diagnosis present

## 2017-01-20 DIAGNOSIS — Z833 Family history of diabetes mellitus: Secondary | ICD-10-CM

## 2017-01-20 DIAGNOSIS — N390 Urinary tract infection, site not specified: Secondary | ICD-10-CM | POA: Diagnosis not present

## 2017-01-20 DIAGNOSIS — L89891 Pressure ulcer of other site, stage 1: Secondary | ICD-10-CM | POA: Diagnosis present

## 2017-01-20 DIAGNOSIS — Z951 Presence of aortocoronary bypass graft: Secondary | ICD-10-CM

## 2017-01-20 DIAGNOSIS — Z7901 Long term (current) use of anticoagulants: Secondary | ICD-10-CM

## 2017-01-20 DIAGNOSIS — E860 Dehydration: Secondary | ICD-10-CM | POA: Diagnosis present

## 2017-01-20 DIAGNOSIS — M1711 Unilateral primary osteoarthritis, right knee: Secondary | ICD-10-CM | POA: Diagnosis present

## 2017-01-20 DIAGNOSIS — D649 Anemia, unspecified: Secondary | ICD-10-CM | POA: Diagnosis present

## 2017-01-20 DIAGNOSIS — I255 Ischemic cardiomyopathy: Secondary | ICD-10-CM | POA: Diagnosis present

## 2017-01-20 DIAGNOSIS — Z888 Allergy status to other drugs, medicaments and biological substances status: Secondary | ICD-10-CM

## 2017-01-20 DIAGNOSIS — Z86711 Personal history of pulmonary embolism: Secondary | ICD-10-CM

## 2017-01-20 DIAGNOSIS — E039 Hypothyroidism, unspecified: Secondary | ICD-10-CM | POA: Diagnosis present

## 2017-01-20 DIAGNOSIS — R109 Unspecified abdominal pain: Secondary | ICD-10-CM | POA: Diagnosis not present

## 2017-01-20 DIAGNOSIS — Z79899 Other long term (current) drug therapy: Secondary | ICD-10-CM

## 2017-01-20 DIAGNOSIS — I482 Chronic atrial fibrillation: Secondary | ICD-10-CM | POA: Diagnosis present

## 2017-01-20 DIAGNOSIS — I252 Old myocardial infarction: Secondary | ICD-10-CM

## 2017-01-20 DIAGNOSIS — M810 Age-related osteoporosis without current pathological fracture: Secondary | ICD-10-CM | POA: Diagnosis present

## 2017-01-20 DIAGNOSIS — Z8249 Family history of ischemic heart disease and other diseases of the circulatory system: Secondary | ICD-10-CM

## 2017-01-20 DIAGNOSIS — N3001 Acute cystitis with hematuria: Secondary | ICD-10-CM | POA: Diagnosis not present

## 2017-01-20 DIAGNOSIS — E785 Hyperlipidemia, unspecified: Secondary | ICD-10-CM | POA: Diagnosis present

## 2017-01-20 DIAGNOSIS — E871 Hypo-osmolality and hyponatremia: Secondary | ICD-10-CM | POA: Diagnosis present

## 2017-01-20 DIAGNOSIS — I2510 Atherosclerotic heart disease of native coronary artery without angina pectoris: Secondary | ICD-10-CM | POA: Diagnosis present

## 2017-01-20 DIAGNOSIS — Z87891 Personal history of nicotine dependence: Secondary | ICD-10-CM

## 2017-01-20 DIAGNOSIS — Z7951 Long term (current) use of inhaled steroids: Secondary | ICD-10-CM

## 2017-01-20 DIAGNOSIS — Z8546 Personal history of malignant neoplasm of prostate: Secondary | ICD-10-CM

## 2017-01-20 DIAGNOSIS — R339 Retention of urine, unspecified: Secondary | ICD-10-CM | POA: Diagnosis not present

## 2017-01-20 DIAGNOSIS — Z880 Allergy status to penicillin: Secondary | ICD-10-CM

## 2017-01-20 DIAGNOSIS — R338 Other retention of urine: Secondary | ICD-10-CM

## 2017-01-20 DIAGNOSIS — Z8673 Personal history of transient ischemic attack (TIA), and cerebral infarction without residual deficits: Secondary | ICD-10-CM

## 2017-01-20 DIAGNOSIS — Z923 Personal history of irradiation: Secondary | ICD-10-CM

## 2017-01-20 DIAGNOSIS — K219 Gastro-esophageal reflux disease without esophagitis: Secondary | ICD-10-CM | POA: Diagnosis present

## 2017-01-20 DIAGNOSIS — Z7982 Long term (current) use of aspirin: Secondary | ICD-10-CM

## 2017-01-20 DIAGNOSIS — K5900 Constipation, unspecified: Secondary | ICD-10-CM | POA: Diagnosis present

## 2017-01-20 DIAGNOSIS — Z95810 Presence of automatic (implantable) cardiac defibrillator: Secondary | ICD-10-CM

## 2017-01-20 LAB — URINALYSIS, ROUTINE W REFLEX MICROSCOPIC
BILIRUBIN URINE: NEGATIVE
Glucose, UA: NEGATIVE mg/dL
Ketones, ur: 5 mg/dL — AB
NITRITE: POSITIVE — AB
PH: 6 (ref 5.0–8.0)
Protein, ur: NEGATIVE mg/dL
SQUAMOUS EPITHELIAL / LPF: NONE SEEN
Specific Gravity, Urine: 1.013 (ref 1.005–1.030)

## 2017-01-20 LAB — CBC WITH DIFFERENTIAL/PLATELET
Basophils Absolute: 0 10*3/uL (ref 0.0–0.1)
Basophils Relative: 0 %
Eosinophils Absolute: 0.2 10*3/uL (ref 0.0–0.7)
Eosinophils Relative: 2 %
HEMATOCRIT: 34.7 % — AB (ref 39.0–52.0)
HEMOGLOBIN: 12.1 g/dL — AB (ref 13.0–17.0)
LYMPHS ABS: 0.6 10*3/uL — AB (ref 0.7–4.0)
Lymphocytes Relative: 7 %
MCH: 31.5 pg (ref 26.0–34.0)
MCHC: 34.9 g/dL (ref 30.0–36.0)
MCV: 90.4 fL (ref 78.0–100.0)
MONOS PCT: 8 %
Monocytes Absolute: 0.7 10*3/uL (ref 0.1–1.0)
NEUTROS ABS: 7.2 10*3/uL (ref 1.7–7.7)
NEUTROS PCT: 83 %
Platelets: 156 10*3/uL (ref 150–400)
RBC: 3.84 MIL/uL — ABNORMAL LOW (ref 4.22–5.81)
RDW: 14 % (ref 11.5–15.5)
WBC: 8.7 10*3/uL (ref 4.0–10.5)

## 2017-01-20 LAB — I-STAT CHEM 8, ED
BUN: 14 mg/dL (ref 6–20)
CALCIUM ION: 1.06 mmol/L — AB (ref 1.15–1.40)
CREATININE: 0.7 mg/dL (ref 0.61–1.24)
Chloride: 88 mmol/L — ABNORMAL LOW (ref 101–111)
Glucose, Bld: 96 mg/dL (ref 65–99)
HCT: 37 % — ABNORMAL LOW (ref 39.0–52.0)
Hemoglobin: 12.6 g/dL — ABNORMAL LOW (ref 13.0–17.0)
Potassium: 4.5 mmol/L (ref 3.5–5.1)
Sodium: 122 mmol/L — ABNORMAL LOW (ref 135–145)
TCO2: 23 mmol/L (ref 22–32)

## 2017-01-20 LAB — PROTIME-INR
INR: 3.81
Prothrombin Time: 37.3 seconds — ABNORMAL HIGH (ref 11.4–15.2)

## 2017-01-20 MED ORDER — LEVOFLOXACIN 750 MG PO TABS
750.0000 mg | ORAL_TABLET | Freq: Once | ORAL | Status: AC
  Administered 2017-01-20: 750 mg via ORAL
  Filled 2017-01-20: qty 1

## 2017-01-20 NOTE — H&P (Signed)
History and Physical   [name] YIF:[id number] DOB: 11-09-1932 DOA: 01/20/2017  PCP: [name], MD  Chief Complaint: Abdominal pain  HPI: 83 year old man with cardiac history significant for transcutaneous aortic valve replacement in 8676, systolic heart failure status post ICD, coronary artery disease status post CABG, prostate cancer status post radiation therapy, chronic back pain, and urinary retention presenting with abdominal pain. Next line patient reports having problems with urinary retention, urology team removed his Foley on about 3-4 weeks ago and over the past week he has been visiting his wife has been sick in the hospital and he has been taking tramadol for his chronic back pain. He reports having constipation has not had a bowel movement in over 5 days, last able to urinate normally 6 days prior to admission, since then easily be able to urinate drops of urine per his report. He takes MiraLAX daily but has not had much success over the course of the past week as mentioned above.  At baseline he lives alone with his wife, however they have significant support in the home, have hired caregivers. He presents today with his son who assists with the history. He is independent in his ADLs, requires assistance with his IADLs. Walks with a 2 wheeled walker. He is a former smoker, retired Marine scientist.  He denies any fevers, chills, shortness of breath or chest pain, nausea, vomiting. He does report some bleeding associated with his hemorrhoids the setting of constipation. He does not take Lasix on a daily basis but he tried to induce urination by taking his Lasix 20 mg on the day of admission at the request of his loved ones. He does take an alpha blocker for his urinary retention.  He takes Coumadin at a dose of 5 mg by mouth daily, he's uncertain of his last dose. He reports his goal is 2.5-3.  ED Course: In emergency department is tachycardic 7:20, systolic blood pressure 947, BMP  remarkable for sodium of 122, CBC with mild anemia 12.1, PT/INR 3.81.  Urinary retention, he had Foley catheter placed, drained over 500 mL. UA significant for positive nitrites and leukocytes. Urine culture pending. Due to history of penicillin allergy, treated with levofloxacin. Medicine consulted for admission.  Review of Systems: A complete ROS was obtained; pertinent positives negatives are denoted in the HPI. Otherwise, all systems are negative.   Past Medical History:  Diagnosis Date  . AF (atrial fibrillation) ([name])    Has not tolerated amiodarone in the past. Amiodarone was stopped in September of 2010 due to side effects  . Aortic stenosis, severe    WITH PERCUTANEOUS AORTIC VALVE (TAVI) IN March 2012 at the [facility]  . Cervical myelopathy ([place])   . CHF (congestive heart failure) ([place])   . Chronic anticoagulation    on coumadin  . Coronary artery disease   . Diverticulitis    CURRENTLY CONTROLLED WITH NO EVIDENCE OF RECURRENT INFECTION  . Esophageal stricture    WITH DILATATION  . GERD (gastroesophageal reflux disease)   . Heart murmur   . Hyperlipidemia   . Ischemic cardiomyopathy 05/2010   Has EF of 25%  . MI (myocardial infarction) ([place]) 1974, 1978  . NSVT (nonsustained ventricular tachycardia) ([place])   . Osteoarthritis    RIGHT KNEE  . Osteoporosis   . Prostate cancer ([place])   . Pulmonary embolism ([place])   . Stroke [facility])    3 strokes   Social History   Socioeconomic History  . Marital status: Married  Spouse name: Not on file  . Number of children: Y  . Years of education: Not on file  . Highest education level: Not on file  Social Needs  . Financial resource strain: Not on file  . Food insecurity - worry: Not on file  . Food insecurity - inability: Not on file  . Transportation needs - medical: Not on file  . Transportation needs - non-medical: Not on file  Occupational History  . Occupation: retired. developer.     Employer: RETIRED  Tobacco  Use  . Smoking status: Former Smoker    Packs/day: 4.00    Years: 15.00    Pack years: 60.00    Types: Cigarettes    Last attempt to quit: 06/15/1969    Years since quitting: 47.6  . Smokeless tobacco: Never Used  Substance and Sexual Activity  . Alcohol use: No  . Drug use: No  . Sexual activity: Yes  Other Topics Concern  . Not on file  Social History Narrative  . Not on file   Family History  Problem Relation Age of Onset  . Heart disease Mother 37  . Diabetes Mother 55  . Heart disease Father 65    Physical Exam: Vitals:   01/20/17 2045 01/20/17 2130 01/20/17 2200 01/20/17 2230  BP: 125/68 (!) 107/57 120/64 119/64  Pulse: 62 77 69 67  Resp:      Temp:      TempSrc:      SpO2: 95% 96% 95% 96%   General: Appears calm and comfortable, elderly white man. ENT: Grossly normal hearing with hearing aid assistance, mucus membranes slightly dry Cardiovascular: RRR. Cardiac devices present L chest.  No M/R/G. No LE edema.  Respiratory: CTA bilaterally. No wheezes or crackles. Normal respiratory effort. Abdomen: Soft, non-tender. Bowel sounds present. Hemorrhoid noted, no active bleeding. Skin: No rash or induration seen on limited exam, with exception of peri-anal erythema, no skin breakdown. Musculoskeletal: Grossly normal tone BUE/BLE. Appropriate ROM.  Psychiatric: Grossly normal mood and affect, oriented to current year. Neurologic: Moves all extremities in coordinated fashion. GU: foley catheter in place draining dark colored urine; ~750 cc  I have personally reviewed the following labs, culture data, and imaging studies.  Assessment/Plan:  Urinary retention UTI, likely Hx of urinary retention, with recent successful voiding trial in 12/2016, presenting with abdominal pain found to have urinary retention, pain alleviated with foley catheter placement; possibly exacerbated by constipation. Plan: -continue fluoroquinolone for empiric tx of UTI, follow up final urine  cx -continue foley catheter -continue alpha-blocker (alfluzosin)  Other problems -Hyponatremia: suspect related to dehydration induced by furosemide usage, gently hydrating with 75 cc q hr of NS x 8 hrs, repeat BMP in AM -Cardiac diagnoses: CAD, systolic heart failure, hx of aortic valve replacement: does not appear to be volume overloaded on exam, continue home digoxin, atorvastatin, ASA, and BB -AF: INR at 3.81; hold warfarin now, check INR in am this will inform future warfarin dosing -Hypothyroidism: continue home thyroid supplement -Constipation: continue home miralax, add lactulose in hopes of improving bowel regimen -Chronic low back pain: scheduled acetaminophen  DVT prophylaxis: none due to elevated INR Code Status: full code Disposition Plan: Anticipate D/C home in 2-5 days Consults called: none Admission status: admit to hospital medicine   Cheri Rous, MD [facility] Page:[phone]  If 7PM-7AM, please contact night-coverage [email or web address] Password [email or web address]

## 2017-01-20 NOTE — ED Provider Notes (Signed)
[facility] EMERGENCY DEPARTMENT Provider Note   CSN: [id number] Arrival date & time: 01/20/17  1938     History   Chief Complaint Chief Complaint  Patient presents with  . Urinary Retention    HPI [name] is a 83 y.o. male.  HPI   Patient is an 83 year old male with a history of atrial fibrillation, aortic valve replacement, CHF, CAD, prostate cancer, strokes, and urinary retention presenting for inability to urinate.  Patient and his son at bedside report that this problem began approximately a month ago when he had a fall.  The current episode of urinary retention began this evening.  Patient visited his urologist and it was suspected that he had trauma that caused a contusion around the bladder that affected his ability to urinate.  A Foley was placed at that time, and he was sent home with it.  Patient had a Foley for 17 days but it was removed about 2 weeks ago.  Since then patient reports that he is persistently having more more difficulty urinating.  Patient reports over the last 5 days it is gotten particularly worse.  Patient reports he is only been able to produce dribbling amounts of urine today and it burns to urinate.  Patient reports he has not had a bowel movement in 5-6 days.  Patient is currently nauseous but denies any fever, chills, vomiting, diarrhea chest pain, shortness of breath.  Patient reports he has had a small amount of blood in his stools after straining.  Patient had prostate cancer 10 years ago, and is currently on surveillance.    On discussion with patient son alone, patient son reports that his father has been having problems with memory, and per the aide report at home who takes care of his parents, he has voided several times a day.  It is unknown what amount these are.  Past Medical History:  Diagnosis Date  . AF (atrial fibrillation) ([place])    Has not tolerated amiodarone in the past. Amiodarone was stopped in September of  2010 due to side effects  . Aortic stenosis, severe    WITH PERCUTANEOUS AORTIC VALVE (TAVI) IN March 2012 at the [facility]  . Cervical myelopathy ([place])   . CHF (congestive heart failure) ([place])   . Chronic anticoagulation    on coumadin  . Coronary artery disease   . Diverticulitis    CURRENTLY CONTROLLED WITH NO EVIDENCE OF RECURRENT INFECTION  . Esophageal stricture    WITH DILATATION  . GERD (gastroesophageal reflux disease)   . Heart murmur   . Hyperlipidemia   . Ischemic cardiomyopathy 05/2010   Has EF of 25%  . MI (myocardial infarction) ([place]) 1974, 1978  . NSVT (nonsustained ventricular tachycardia) ([place])   . Osteoarthritis    RIGHT KNEE  . Osteoporosis   . Prostate cancer ([place])   . Pulmonary embolism ([place])   . Stroke [facility])    3 strokes    Patient Active Problem List   Diagnosis Date Noted  . Shortness of breath   . Weakness 09/01/2015  . Hyponatremia 09/01/2015  . Hypothyroidism 09/01/2015  . DOE (dyspnea on exertion) 09/01/2015  . Chronic systolic CHF (congestive heart failure) ([place]) 09/01/2015  . Exertional dyspnea 09/01/2015  . GI bleed 01/16/2014  . GIB (gastrointestinal bleeding) 01/16/2014  . Encounter for therapeutic drug monitoring 05/09/2013  . Automatic implantable cardioverter-defibrillator in situ 12/13/2012  . Rectal bleed 05/30/2012    Class: Acute  . Acute low  back pain 05/30/2012    Class: Acute  . Hypotension 05/30/2012    Class: Acute  . Vertebral fracture, osteoporotic ([place]) 05/21/2012  . Gait instability 12/02/2011    Class: Acute  . Atrial fibrillation, chronic ([place]) 12/02/2011    Class: Chronic  . Anticoagulated on Coumadin 12/02/2011  . Deformity, finger acquired 12/19/2010  . Chronic cough 12/05/2010  . S/P aortic valve replacement 10/22/2010  . Ischemic cardiomyopathy   . Diverticulitis   . Prostate cancer ([place])   . Osteoarthritis   . Cervical myelopathy ([place])   . Pulmonary embolism ([place])   . Hyperlipidemia   . GERD  (gastroesophageal reflux disease)   . Esophageal stricture   . Indigestion 06/29/2010  . VENTRICULAR TACHYCARDIA 01/15/2009    Past Surgical History:  Procedure Laterality Date  . AORTIC VALVE REPLACEMENT     Percutaneous AVR in March 2012 at the [facility]  . BACK SURGERY    . CARDIAC CATHETERIZATION  2010   SEVERE LV DYSFUNCTION WITH ESTIMATED EJECTION FRACTION OF 25%  . CHOLECYSTECTOMY    . CORONARY ARTERY BYPASS GRAFT  1979  . CORONARY ARTERY BYPASS GRAFT  1991   REDO SURGERY  . ICD  Feb 2003; 02/2013   gen change 02-11-2013 by Dr Caryl Comes  . SHOULDER SURGERY         Home Medications    Prior to Admission medications   Medication Sig Start Date End Date Taking? Authorizing Provider  acetaminophen (TYLENOL) 500 MG tablet Take 1,000 mg by mouth every 6 (six) hours as needed for mild pain.     [provider]  albuterol (PROVENTIL HFA;VENTOLIN HFA) 108 (90 Base) MCG/ACT inhaler Inhale 1-2 puffs into the lungs every 6 (six) hours as needed for wheezing or shortness of breath.    [provider]  alfuzosin (UROXATRAL) 10 MG 24 hr tablet Take 10 mg by mouth at bedtime.     [provider]  aspirin 81 MG tablet Take 81 mg by mouth daily.    [provider]  atorvastatin (LIPITOR) 10 MG tablet Take 10 mg by mouth every other day.    [provider]  benzonatate (TESSALON) 100 MG capsule Take 100 mg by mouth 3 (three) times daily as needed for cough.    [provider]  Budesonide-Formoterol Fumarate (SYMBICORT IN) Inhale 1 puff into the lungs 2 (two) times daily.    [provider]  carvedilol (COREG) 6.25 MG tablet TAKE 1 TABLET TWICE DAILY WITH A MEAL. 07/24/16   Sherren Mocha, MD  cholecalciferol (VITAMIN D) 1000 UNITS tablet Take 1,000 Units by mouth 2 (two) times daily.    [provider]  COUMADIN 5 MG tablet TAKE AS DIRECTED BY COUMADIN CLINIC. 11/17/16   Sherren Mocha, MD  dicyclomine (BENTYL) 20 MG  tablet Take 1 tablet (20 mg total) by mouth 2 (two) times daily as needed for spasms. 07/06/16   Carlisle Cater, PA-C  enoxaparin (LOVENOX) 80 MG/0.8ML injection Inject 0.8 mLs (80 mg total) into the skin every 12 (twelve) hours. 07/28/16   Sherren Mocha, MD  furosemide (LASIX) 20 MG tablet TAKE (1) TABLET DAILY AS NEEDED FOR SWELLING. 06/19/16   Sherren Mocha, MD  ipratropium (ATROVENT) 0.03 % nasal spray Place 2 sprays into both nostrils 2 (two) times daily.    [provider]  levothyroxine (SYNTHROID) 125 MCG tablet Take 1 tablet (125 mcg total) by mouth daily before breakfast. 09/02/15   Hosie Poisson, MD  linaclotide [facility]) 145 MCG  CAPS capsule Take 145 mcg by mouth daily before breakfast.    [provider]  nitroGLYCERIN (NITROSTAT) 0.4 MG SL tablet Place 1 tablet (0.4 mg total) under the tongue every 5 (five) minutes x 3 doses as needed for chest pain (Max 3 doses in 15 min. Call 911). 12/06/16   Sherren Mocha, MD  omeprazole (PRILOSEC) 20 MG capsule Take 20 mg by mouth daily as needed (acid reflux).    [provider]  polyethylene glycol (MIRALAX / GLYCOLAX) packet Take 17 g by mouth daily as needed for mild constipation.     [provider]  Probiotic Product (PROBIOTIC DAILY PO) Take 1 capsule by mouth daily.    [provider]  RESTASIS 0.05 % ophthalmic emulsion Place 1 drop into both eyes 2 (two) times daily as needed (dry eyes).  12/05/11   [provider]  traMADol (ULTRAM) 50 MG tablet Take 50 mg by mouth every 6 (six) hours as needed for moderate pain.    [provider]    Family History Family History  Problem Relation Age of Onset  . Heart disease Mother 72  . Diabetes Mother 75  . Heart disease Father 20    Social History Social History   Tobacco Use  . Smoking status: Former Smoker    Packs/day: 4.00    Years: 15.00    Pack years: 60.00    Types: Cigarettes    Last attempt to quit: 06/15/1969     Years since quitting: 47.6  . Smokeless tobacco: Never Used  Substance Use Topics  . Alcohol use: No  . Drug use: No     Allergies   Penicillins; Tizanidine; Ace inhibitors; Antihistamines, diphenhydramine-type; Clemastine fumarate; and Amiodarone   Review of Systems Review of Systems  Constitutional: Negative for chills and fever.  HENT: Negative for congestion and sore throat.   Eyes: Negative for visual disturbance.  Respiratory: Negative for chest tightness and shortness of breath.   Cardiovascular: Negative for chest pain.  Gastrointestinal: Positive for abdominal pain, constipation and nausea. Negative for diarrhea and vomiting.  Genitourinary: Positive for decreased urine volume, difficulty urinating and dysuria.  Musculoskeletal: Positive for back pain.  Neurological: Negative for dizziness, syncope, light-headedness and headaches.     Physical Exam Updated Vital Signs BP (!) 144/100   Pulse 78   Temp 98.1 F (36.7 C) (Oral)   Resp 18   SpO2 96%   Physical Exam  Constitutional: He appears well-developed and well-nourished. No distress.  HENT:  Head: Normocephalic and atraumatic.  Mouth/Throat: Oropharynx is clear and moist.  Eyes: Conjunctivae and EOM are normal. Pupils are equal, round, and reactive to light.  Neck: Normal range of motion. Neck supple.  Cardiovascular: Normal rate, regular rhythm, S1 normal and S2 normal.  No murmur heard. Pulmonary/Chest: Effort normal and breath sounds normal. He has no wheezes. He has no rales.  Abdominal: Soft. He exhibits no distension. There is tenderness. There is no guarding.  Tenderness to palpation over suprapubic region.  Genitourinary:  Genitourinary Comments: Exam performed with Noemi Chapel, MD.  Patient exhibits multiple prolapsed internal hemorrhoids.  Anoscope exam demonstrates no bleeding in proximal rectum.  There are multiple internal hemorrhage.  There is no evidence of thrombosis of any external  hemorrhoids.  There is minor active bleeding of the internal hemorrhoids  Musculoskeletal: Normal range of motion. He exhibits no edema or deformity.  Neurological: He is alert.  Patient was extremities with good coordination and symmetrically.Cranial nerves grossly  intact.   Skin: Skin is warm and dry. No rash noted. No erythema.  Psychiatric: He has a normal mood and affect. His behavior is normal. Judgment and thought content normal.  Nursing note and vitals reviewed.    ED Treatments / Results  Labs (all labs ordered are listed, but only abnormal results are displayed) Labs Reviewed  URINALYSIS, ROUTINE W REFLEX MICROSCOPIC    EKG  EKG Interpretation None       Radiology No results found.  Procedures Procedures (including critical care time)  Medications Ordered in ED Medications - No data to display   Initial Impression / Assessment and Plan / ED Course  I have reviewed the triage vital signs and the nursing notes.  Pertinent labs & imaging results that were available during my care of the patient were reviewed by me and considered in my medical decision making (see chart for details).      Final Clinical Impressions(s) / ED Diagnoses   Final diagnoses:  None   On initial examination, patient had 540 mL of urine in his bladder.  Due to tenderness suprapubically, will Place Foley and reassess after Foley in place.  Differential diagnosis includes BPH, prostate cancer, urinary tract infection, urethral clot.  Rectal bleeding is not significant and is not coming from a higher source than internal hemorrhoids per anoscope exam.  Suprapubic tenderness is resolved on reevaluation after Foley placement.  On laboratory evaluation, patient demonstrates urinary tract infection.  This may be causative of his acute urinary retention.  Additionally, patient has a worsening hyponatremia at 122 today. Per record review his sodium has not been this low prior.   Hemoglobin is  12.1.  INR 3.81.  Will consult hospital medicine to admit. Dr. Cheri Rous accepting.  Spoke with pharmacist regarding antibiotic selection for urinary tract infection in the setting of warfarin treatment and anaphylactic allergy to penicillin.  Patient is on warfarin for aortic valve replacement.  Levaquin preferred agent with close INR follow-up.  ED Discharge Orders    None        Tamala Julian 01/21/17 0256    Noemi Chapel, MD 01/21/17 [phone]

## 2017-01-20 NOTE — ED Triage Notes (Signed)
Pt comes from home via [facility] for urinary retention, pt has a home health nurse who stays with him and reports pt has voided today normally. Pt has a hx of dementia. Pt also reports blood in his stool today, hx of hemorrhoids.

## 2017-01-20 NOTE — ED Provider Notes (Signed)
The patient is a 83 year old male, he has had some difficulties with urinary retention over the last month including visiting with the urologist at [place] where they placed a Foley catheter for the better part of 2 weeks, this was recently discontinued and for the last week he has been doing well however he notes that over the course of the day he has been unable to urinate and has had a progressive pain in his lower abdomen.  On exam the patient initially had tenderness.  When I was able to examine the patient he had a Foley catheter in place which drained his entire bladder and now had a soft nontender abdomen.  He states he feels much better.  The patient also complained of some rectal bleeding.  On exam the patient does have 4 large nontender hemorrhoids which have minimal bleeding.  An anoscopic exam showed no internal bleeding, all of the blood is coming from those hemorrhoids.  They are not thrombosed.  The patient can follow-up with gastroenterology.  According to the son he has had prior banding procedures and has chronic hemorrhoids.  Will recommend Proctofoam and follow-up with GI.  He has seen Dr. Penelope Coop in the past  Procedure Note:  Anoscopy  Risks benefits alternatives of the procedure given to the patient Verbal Consent obtained Patient placed in the lateral decubitus position Anoscopy performed Findings  Large external hemorrhoids - no internal blood in the rectal vault - brown mucous / stool present Hemorrhoids soft and non thrombosed. Patient tolerated procedure without any complaints   Medical screening examination/treatment/procedure(s) were conducted as a shared visit with non-physician practitioner(s) and myself.  I personally evaluated the patient during the encounter.  Clinical Impression:   Final diagnoses:  None         Noemi Chapel, MD 01/21/17 [phone]

## 2017-01-21 DIAGNOSIS — R339 Retention of urine, unspecified: Secondary | ICD-10-CM | POA: Diagnosis not present

## 2017-01-21 DIAGNOSIS — Z952 Presence of prosthetic heart valve: Secondary | ICD-10-CM | POA: Diagnosis not present

## 2017-01-21 DIAGNOSIS — R338 Other retention of urine: Secondary | ICD-10-CM | POA: Diagnosis present

## 2017-01-21 DIAGNOSIS — E785 Hyperlipidemia, unspecified: Secondary | ICD-10-CM | POA: Diagnosis present

## 2017-01-21 DIAGNOSIS — M810 Age-related osteoporosis without current pathological fracture: Secondary | ICD-10-CM | POA: Diagnosis present

## 2017-01-21 DIAGNOSIS — R109 Unspecified abdominal pain: Secondary | ICD-10-CM | POA: Diagnosis not present

## 2017-01-21 DIAGNOSIS — I2510 Atherosclerotic heart disease of native coronary artery without angina pectoris: Secondary | ICD-10-CM | POA: Diagnosis present

## 2017-01-21 DIAGNOSIS — K648 Other hemorrhoids: Secondary | ICD-10-CM | POA: Diagnosis present

## 2017-01-21 DIAGNOSIS — E871 Hypo-osmolality and hyponatremia: Secondary | ICD-10-CM | POA: Diagnosis present

## 2017-01-21 DIAGNOSIS — K5900 Constipation, unspecified: Secondary | ICD-10-CM | POA: Diagnosis present

## 2017-01-21 DIAGNOSIS — Z951 Presence of aortocoronary bypass graft: Secondary | ICD-10-CM | POA: Diagnosis not present

## 2017-01-21 DIAGNOSIS — L89891 Pressure ulcer of other site, stage 1: Secondary | ICD-10-CM | POA: Diagnosis present

## 2017-01-21 DIAGNOSIS — Z8546 Personal history of malignant neoplasm of prostate: Secondary | ICD-10-CM | POA: Diagnosis not present

## 2017-01-21 DIAGNOSIS — L89151 Pressure ulcer of sacral region, stage 1: Secondary | ICD-10-CM | POA: Diagnosis present

## 2017-01-21 DIAGNOSIS — Z95810 Presence of automatic (implantable) cardiac defibrillator: Secondary | ICD-10-CM | POA: Diagnosis not present

## 2017-01-21 DIAGNOSIS — I252 Old myocardial infarction: Secondary | ICD-10-CM | POA: Diagnosis not present

## 2017-01-21 DIAGNOSIS — I5022 Chronic systolic (congestive) heart failure: Secondary | ICD-10-CM | POA: Diagnosis present

## 2017-01-21 DIAGNOSIS — E039 Hypothyroidism, unspecified: Secondary | ICD-10-CM | POA: Diagnosis present

## 2017-01-21 DIAGNOSIS — I482 Chronic atrial fibrillation: Secondary | ICD-10-CM | POA: Diagnosis present

## 2017-01-21 DIAGNOSIS — D649 Anemia, unspecified: Secondary | ICD-10-CM | POA: Diagnosis present

## 2017-01-21 DIAGNOSIS — I255 Ischemic cardiomyopathy: Secondary | ICD-10-CM | POA: Diagnosis present

## 2017-01-21 DIAGNOSIS — Z923 Personal history of irradiation: Secondary | ICD-10-CM | POA: Diagnosis not present

## 2017-01-21 DIAGNOSIS — K219 Gastro-esophageal reflux disease without esophagitis: Secondary | ICD-10-CM | POA: Diagnosis present

## 2017-01-21 DIAGNOSIS — E860 Dehydration: Secondary | ICD-10-CM | POA: Diagnosis present

## 2017-01-21 DIAGNOSIS — M1711 Unilateral primary osteoarthritis, right knee: Secondary | ICD-10-CM | POA: Diagnosis present

## 2017-01-21 DIAGNOSIS — N390 Urinary tract infection, site not specified: Secondary | ICD-10-CM | POA: Diagnosis present

## 2017-01-21 DIAGNOSIS — L8990 Pressure ulcer of unspecified site, unspecified stage: Secondary | ICD-10-CM

## 2017-01-21 LAB — CBC
HEMATOCRIT: 34 % — AB (ref 39.0–52.0)
HEMOGLOBIN: 11.4 g/dL — AB (ref 13.0–17.0)
MCH: 30.2 pg (ref 26.0–34.0)
MCHC: 33.5 g/dL (ref 30.0–36.0)
MCV: 90.2 fL (ref 78.0–100.0)
Platelets: 172 10*3/uL (ref 150–400)
RBC: 3.77 MIL/uL — AB (ref 4.22–5.81)
RDW: 13.7 % (ref 11.5–15.5)
WBC: 6.9 10*3/uL (ref 4.0–10.5)

## 2017-01-21 LAB — BASIC METABOLIC PANEL
ANION GAP: 7 (ref 5–15)
BUN: 11 mg/dL (ref 6–20)
CHLORIDE: 90 mmol/L — AB (ref 101–111)
CO2: 27 mmol/L (ref 22–32)
Calcium: 8.3 mg/dL — ABNORMAL LOW (ref 8.9–10.3)
Creatinine, Ser: 0.84 mg/dL (ref 0.61–1.24)
GFR calc non Af Amer: >60 mL/min (ref >60)
GLUCOSE: 107 mg/dL — AB (ref 65–99)
POTASSIUM: 4 mmol/L (ref 3.5–5.1)
Sodium: 124 mmol/L — ABNORMAL LOW (ref 135–145)

## 2017-01-21 LAB — PROTIME-INR
INR: 3.19
PROTHROMBIN TIME: 32.4 s — AB (ref 11.4–15.2)

## 2017-01-21 LAB — TSH: TSH: 2.815 u[IU]/mL (ref 0.350–4.500)

## 2017-01-21 MED ORDER — CYCLOSPORINE 0.05 % OP EMUL
1.0000 [drp] | Freq: Two times a day (BID) | OPHTHALMIC | Status: DC | PRN
  Filled 2017-01-21: qty 1

## 2017-01-21 MED ORDER — POLYETHYLENE GLYCOL 3350 17 G PO PACK
17.0000 g | PACK | Freq: Two times a day (BID) | ORAL | Status: DC
  Administered 2017-01-21: 17 g via ORAL
  Filled 2017-01-21: qty 1

## 2017-01-21 MED ORDER — ASPIRIN EC 81 MG PO TBEC
81.0000 mg | DELAYED_RELEASE_TABLET | Freq: Every day | ORAL | Status: DC
  Administered 2017-01-21 – 2017-01-22 (×2): 81 mg via ORAL
  Filled 2017-01-21 (×2): qty 1

## 2017-01-21 MED ORDER — LEVOFLOXACIN 750 MG PO TABS
750.0000 mg | ORAL_TABLET | Freq: Every day | ORAL | Status: DC
  Administered 2017-01-21: 750 mg via ORAL
  Filled 2017-01-21: qty 1

## 2017-01-21 MED ORDER — MOMETASONE FURO-FORMOTEROL FUM 100-5 MCG/ACT IN AERO
2.0000 | INHALATION_SPRAY | Freq: Two times a day (BID) | RESPIRATORY_TRACT | Status: DC
  Administered 2017-01-21 – 2017-01-22 (×2): 2 via RESPIRATORY_TRACT
  Filled 2017-01-21: qty 8.8

## 2017-01-21 MED ORDER — WHITE PETROLATUM EX OINT
TOPICAL_OINTMENT | CUTANEOUS | Status: AC
  Administered 2017-01-21: 1
  Filled 2017-01-21: qty 28.35

## 2017-01-21 MED ORDER — ATORVASTATIN CALCIUM 10 MG PO TABS
10.0000 mg | ORAL_TABLET | ORAL | Status: DC
  Administered 2017-01-21: 10 mg via ORAL
  Filled 2017-01-21: qty 1

## 2017-01-21 MED ORDER — ALFUZOSIN HCL ER 10 MG PO TB24
10.0000 mg | ORAL_TABLET | Freq: Every day | ORAL | Status: DC
  Administered 2017-01-21 (×2): 10 mg via ORAL
  Filled 2017-01-21 (×3): qty 1

## 2017-01-21 MED ORDER — LACTULOSE 10 GM/15ML PO SOLN
20.0000 g | Freq: Two times a day (BID) | ORAL | Status: DC
  Administered 2017-01-21 (×2): 20 g via ORAL
  Filled 2017-01-21 (×2): qty 30

## 2017-01-21 MED ORDER — SODIUM CHLORIDE 0.9 % IV SOLN
INTRAVENOUS | Status: AC
  Administered 2017-01-21: 02:00:00 via INTRAVENOUS

## 2017-01-21 MED ORDER — LEVOTHYROXINE SODIUM 25 MCG PO TABS
125.0000 ug | ORAL_TABLET | Freq: Every day | ORAL | Status: DC
  Administered 2017-01-21 – 2017-01-22 (×2): 125 ug via ORAL
  Filled 2017-01-21 (×2): qty 1

## 2017-01-21 MED ORDER — SODIUM CHLORIDE 0.9 % IV SOLN
INTRAVENOUS | Status: DC
  Administered 2017-01-21 – 2017-01-22 (×2): via INTRAVENOUS

## 2017-01-21 MED ORDER — IPRATROPIUM-ALBUTEROL 0.5-2.5 (3) MG/3ML IN SOLN
3.0000 mL | Freq: Four times a day (QID) | RESPIRATORY_TRACT | Status: DC
  Administered 2017-01-21 (×2): 3 mL via RESPIRATORY_TRACT
  Filled 2017-01-21 (×2): qty 3

## 2017-01-21 MED ORDER — CARVEDILOL 6.25 MG PO TABS
6.2500 mg | ORAL_TABLET | Freq: Two times a day (BID) | ORAL | Status: DC
  Administered 2017-01-21 – 2017-01-22 (×3): 6.25 mg via ORAL
  Filled 2017-01-21 (×3): qty 1

## 2017-01-21 MED ORDER — ALBUTEROL SULFATE (2.5 MG/3ML) 0.083% IN NEBU: 2.5000 mg | INHALATION_SOLUTION | RESPIRATORY_TRACT | Status: DC | PRN

## 2017-01-21 MED ORDER — VITAMIN D 1000 UNITS PO TABS
1000.0000 [IU] | ORAL_TABLET | Freq: Two times a day (BID) | ORAL | Status: DC
  Administered 2017-01-21 – 2017-01-22 (×3): 1000 [IU] via ORAL
  Filled 2017-01-21 (×3): qty 1

## 2017-01-21 MED ORDER — ACETAMINOPHEN 500 MG PO TABS
1000.0000 mg | ORAL_TABLET | Freq: Three times a day (TID) | ORAL | Status: DC
  Administered 2017-01-21 – 2017-01-22 (×5): 1000 mg via ORAL
  Filled 2017-01-21 (×5): qty 2

## 2017-01-21 MED ORDER — ALBUTEROL SULFATE (2.5 MG/3ML) 0.083% IN NEBU: 2.5000 mg | INHALATION_SOLUTION | Freq: Four times a day (QID) | RESPIRATORY_TRACT | Status: DC | PRN

## 2017-01-21 MED ORDER — FLUTICASONE FUROATE-VILANTEROL 100-25 MCG/INH IN AEPB
1.0000 | INHALATION_SPRAY | Freq: Every day | RESPIRATORY_TRACT | Status: DC
  Administered 2017-01-21: 09:00:00 1 via RESPIRATORY_TRACT
  Filled 2017-01-21: qty 28

## 2017-01-21 MED ORDER — BUDESONIDE-FORMOTEROL FUMARATE 80-4.5 MCG/ACT IN AERO
2.0000 | INHALATION_SPRAY | Freq: Two times a day (BID) | RESPIRATORY_TRACT | Status: DC
  Filled 2017-01-21: qty 6.9

## 2017-01-21 NOTE — Progress Notes (Signed)
PROGRESS NOTE    [name]  EHM:[id number] DOB: 03-11-1933 DOA: 01/20/2017 PCP: Jeffrey Baton, MD     Brief Narrative:  [name] is a 83 year old man with past medical history significant for transcutaneous aortic valve replacement in 3662, systolic heart failure status post ICD, coronary artery disease status post CABG, prostate cancer status post radiation therapy, chronic back pain, and urinary retention presenting with abdominal pain. Patient reports having problems with urinary retention. He follows with [facility]rology at [name]. He had his foley catheter removed about 3-4 weeks ago after passing voiding trial. He reports having constipation has not had a bowel movement in over 5 days, last able to urinate normally 6 days prior to admission. He states that his urination would only be 10 drops. He noted burning with urination and lower pelvis pain. He took 1 dose lasix to induce urination without success.   Assessment & Plan:   Active Problems:   Hyponatremia   Pressure injury of skin   Acute urinary retention Hx prostate cancer s/p IMRT in Nov 2008  Follows with urology at [facility] replaced Continue alfuzosin   Urinary tract infection Continue levaquin (PCN allergy)  Await final urine culture  Hyponatremia Suspect secondary to dehydration, poor PO intake  Continue IV fluids, improving  Coronary artery disease s/p CABG  Continue aspirin  Chronic systolic heart failure Stable, continue coreg   S/p aortic valve replacement Permanent A Fib  Coumadin per pharmacy. INR goal 2.5-3   Hyperlipidemia Continue atorvastatin  Hypothyroidism Continue Synthroid  Constipation Resolved, had 3-4 BM this morning.  Stop lactulose and MiraLAX.   DVT prophylaxis: coumadin Code Status: full Family Communication: no family at bedside Disposition Plan: pending stabilization   Consultants:   None  Procedures:   None   Antimicrobials:  Anti-infectives (From  admission, onward)   Start     Dose/Rate Route Frequency Ordered Stop   01/21/17 2200  levofloxacin (LEVAQUIN) tablet 750 mg     750 mg Oral Daily at bedtime 01/21/17 0052     01/20/17 2230  levofloxacin (LEVAQUIN) tablet 750 mg     750 mg Oral  Once 01/20/17 2226 01/20/17 2238        Subjective: Patient had 3-4 large bowel movements this morning.  He has no other complaints.  He did admit to dysuria prior to admission.  Currently, Foley is in place draining urine.  Objective: Vitals:   01/21/17 0015 01/21/17 0056 01/21/17 0600 01/21/17 0925  BP: 105/64 124/63 107/65   Pulse: 75 82 68   Resp:  17 17   Temp:  98.2 F (36.8 C) 98.4 F (36.9 C)   TempSrc:  Oral Oral   SpO2: 97% 97% 97% 96%  Weight:  86.2 kg (190 lb)    Height:  6\' 2"  (1.88 m)      Intake/Output Summary (Last 24 hours) at 01/21/2017 1416 Last data filed at 01/21/2017 1403 Gross per 24 hour  Intake 873.75 ml  Output 900 ml  Net -26.25 ml   Filed Weights   01/21/17 0056  Weight: 86.2 kg (190 lb)    Examination:  General exam: Appears calm and comfortable  Respiratory system: Clear to auscultation. Respiratory effort normal. Cardiovascular system: S1 & S2 heard, Irreg rhythm. No pedal edema. Gastrointestinal system: Abdomen is nondistended, soft and nontender. No organomegaly or masses felt. Normal bowel sounds heard. Central nervous system: Alert and oriented. No focal neurological deficits. Extremities: Symmetric 5 x 5 power. Skin: No  rashes, lesions or ulcers Psychiatry: Judgement and insight appear normal. Mood & affect appropriate.   Data Reviewed: I have personally reviewed following labs and imaging studies  CBC: Recent Labs  Lab 01/20/17 2120 01/20/17 2132 01/21/17 0427  WBC 8.7  --  6.9  NEUTROABS 7.2  --   --   HGB 12.1* 12.6* 11.4*  HCT 34.7* 37.0* 34.0*  MCV 90.4  --  90.2  PLT 156  --  387   Basic Metabolic Panel: Recent Labs  Lab 01/20/17 2132 01/21/17 0427  NA 122* 124*    K 4.5 4.0  CL 88* 90*  CO2  --  27  GLUCOSE 96 107*  BUN 14 11  CREATININE 0.70 0.84  CALCIUM  --  8.3*   GFR: Estimated Creatinine Clearance: 78.8 mL/min (by C-G formula based on SCr of 0.84 mg/dL). Liver Function Tests: No results for input(s): AST, ALT, ALKPHOS, BILITOT, PROT, ALBUMIN in the last 168 hours. No results for input(s): LIPASE, AMYLASE in the last 168 hours. No results for input(s): AMMONIA in the last 168 hours. Coagulation Profile: Recent Labs  Lab 01/18/17 01/20/17 2120 01/21/17 0427  INR 3.9 3.81 3.19   Cardiac Enzymes: No results for input(s): CKTOTAL, CKMB, CKMBINDEX, TROPONINI in the last 168 hours. BNP (last 3 results) No results for input(s): PROBNP in the last 8760 hours. HbA1C: No results for input(s): HGBA1C in the last 72 hours. CBG: No results for input(s): GLUCAP in the last 168 hours. Lipid Profile: No results for input(s): CHOL, HDL, LDLCALC, TRIG, CHOLHDL, LDLDIRECT in the last 72 hours. Thyroid Function Tests: Recent Labs    01/21/17 0427  TSH 2.815   Anemia Panel: No results for input(s): VITAMINB12, FOLATE, FERRITIN, TIBC, IRON, RETICCTPCT in the last 72 hours. Sepsis Labs: No results for input(s): PROCALCITON, LATICACIDVEN in the last 168 hours.  No results found for this or any previous visit (from the past 240 hour(s)).     Radiology Studies: No results found.    Scheduled Meds: . acetaminophen  1,000 mg Oral Q8H  . alfuzosin  10 mg Oral QHS  . aspirin EC  81 mg Oral Daily  . atorvastatin  10 mg Oral QODAY  . carvedilol  6.25 mg Oral BID WC  . cholecalciferol  1,000 Units Oral BID  . ipratropium-albuterol  3 mL Nebulization Q6H  . lactulose  20 g Oral BID  . levofloxacin  750 mg Oral QHS  . levothyroxine  125 mcg Oral QAC breakfast  . mometasone-formoterol  2 puff Inhalation BID  . polyethylene glycol  17 g Oral BID   Continuous Infusions: . sodium chloride 75 mL/hr at 01/21/17 1352     LOS: 0 days     Time spent: 40 minutes   Dessa Phi, DO [facility] [email or web address] Password [email or web address] 01/21/2017, 2:16 PM

## 2017-01-21 NOTE — Progress Notes (Signed)
Received pt. Alert and oriented. Pt complains of lower back pain. Had pt on low bed for safety from history of fall at home per son at bed side. Pt settled. Snacks provided.

## 2017-01-21 NOTE — ED Notes (Signed)
Attempted report 

## 2017-01-22 DIAGNOSIS — R338 Other retention of urine: Secondary | ICD-10-CM

## 2017-01-22 LAB — BASIC METABOLIC PANEL
ANION GAP: 7 (ref 5–15)
BUN: 7 mg/dL (ref 6–20)
CALCIUM: 8 mg/dL — AB (ref 8.9–10.3)
CO2: 24 mmol/L (ref 22–32)
CREATININE: 0.85 mg/dL (ref 0.61–1.24)
Chloride: 94 mmol/L — ABNORMAL LOW (ref 101–111)
GFR calc Af Amer: >60 mL/min (ref >60)
Glucose, Bld: 89 mg/dL (ref 65–99)
Potassium: 4 mmol/L (ref 3.5–5.1)
SODIUM: 125 mmol/L — AB (ref 135–145)

## 2017-01-22 LAB — CBC
HCT: 34.7 % — ABNORMAL LOW (ref 39.0–52.0)
Hemoglobin: 11.7 g/dL — ABNORMAL LOW (ref 13.0–17.0)
MCH: 30.3 pg (ref 26.0–34.0)
MCHC: 33.7 g/dL (ref 30.0–36.0)
MCV: 89.9 fL (ref 78.0–100.0)
PLATELETS: 160 10*3/uL (ref 150–400)
RBC: 3.86 MIL/uL — AB (ref 4.22–5.81)
RDW: 13.6 % (ref 11.5–15.5)
WBC: 6.1 10*3/uL (ref 4.0–10.5)

## 2017-01-22 LAB — PROTIME-INR
INR: 2.57
PROTHROMBIN TIME: 27.4 s — AB (ref 11.4–15.2)

## 2017-01-22 MED ORDER — LEVOFLOXACIN 250 MG PO TABS: 250.0000 mg | ORAL_TABLET | Freq: Every day | ORAL | 0 refills | Status: DC

## 2017-01-22 MED ORDER — LEVOFLOXACIN 500 MG PO TABS: 250.0000 mg | ORAL_TABLET | Freq: Every day | ORAL | Status: DC

## 2017-01-22 MED ORDER — WARFARIN SODIUM 5 MG PO TABS: 5.0000 mg | ORAL_TABLET | Freq: Once | ORAL | Status: DC

## 2017-01-22 MED ORDER — WARFARIN - PHARMACIST DOSING INPATIENT: Freq: Every day | Status: DC

## 2017-01-22 MED ORDER — LACTULOSE 10 G PO PACK: 10.0000 g | PACK | Freq: Every day | ORAL | 0 refills | Status: DC | PRN

## 2017-01-22 NOTE — Evaluation (Signed)
Physical Therapy Evaluation Patient Details Name: [name] MRN: [id number] DOB: [birth year] Today's Date: 01/22/2017   History of Present Illness  Pt admitted with urinary retention, UTI, hyponatremia. PMH: falls, AVR 1194, systolic HF, s/p ICD, CAD with CABG, prostate cancer s/p radiation, chronic back pain.     Clinical Impression  Patient evaluated by Physical Therapy with no further acute PT needs identified. All education has been completed and the patient has no further questions. PTA, pt was mod I with all mobility and use of RW for ambulation. Pt is operating near baseline however presents with balance deficits  and history of falling. Session focused on gait and stair training and maximizing safety in preparation for safe d/c home.  Feel patient is safe to return home once medically cleared for d/c, but could  Benefit greatly  from HHPT to improve mobility, balance, and safety See below for any follow-up Physical Therapy or equipment needs. PT is signing off. Thank you for this referral.     Follow Up Recommendations Home health PT;Supervision for mobility/OOB    Equipment Recommendations  None recommended by PT    Recommendations for Other Services       Precautions / Restrictions Precautions Precautions: Fall Restrictions Weight Bearing Restrictions: No      Mobility  Bed Mobility Overal bed mobility: Modified Independent             General bed mobility comments: OOB in chair upon entry  Transfers Overall transfer level: Needs assistance Equipment used: Rolling walker (2 wheeled) Transfers: Sit to/from Stand Sit to Stand: Supervision         General transfer comment: Supervision for safety. Pt performing with proper techinque  Ambulation/Gait Ambulation/Gait assistance: Min guard Ambulation Distance (Feet): 300 Feet Assistive device: Rolling walker (2 wheeled) Gait Pattern/deviations: Step-through pattern;Trunk flexed Gait velocity: decreased    General Gait Details: cue for posture and safety with RW as pt would allow it to travel too far infront of him at times.   Stairs Stairs: Yes Stairs assistance: Min guard Stair Management: One rail Right;Step to pattern Number of Stairs: 12 General stair comments: Cued patient for proper sequencing with step-to side ways with 2UE support for max safety.  Wheelchair Mobility    Modified Rankin (Stroke Patients Only)       Balance Overall balance assessment: Needs assistance Sitting-balance support: Feet unsupported;No upper extremity supported Sitting balance-Leahy Scale: Normal     Standing balance support: Single extremity supported Standing balance-Leahy Scale: Fair Standing balance comment: pt relies on UE support for standing balance. Can tolerate unsupported for short times.                              Pertinent Vitals/Pain Pain Assessment: Faces Faces Pain Scale: Hurts a little bit Pain Location: back Pain Descriptors / Indicators: Aching Pain Intervention(s): Monitored during session    Home Living Family/patient expects to be discharged to:: Private residence Living Arrangements: Spouse/significant other Available Help at Discharge: Family;Personal care attendant;Available 24 hours/day Type of Home: House Home Access: Stairs to enter Entrance Stairs-Rails: Left Entrance Stairs-Number of Steps: 3 Home Layout: Two level;Able to live on main level with bedroom/bathroom Home Equipment: Walker - 4 wheels;Shower seat;Grab bars - tub/shower;Grab bars - toilet;Hand held shower head Additional Comments: has lived on first floor of home for  5 years    Prior Function Level of Independence: Independent with assistive device(s);Needs assistance   Gait /  Transfers Assistance Needed: walks with rollator  ADL's / Homemaking Assistance Needed: assist for IADL        Hand Dominance   Dominant Hand: Right    Extremity/Trunk Assessment   Upper  Extremity Assessment Upper Extremity Assessment: Overall WFL for tasks assessed    Lower Extremity Assessment Lower Extremity Assessment: Overall WFL for tasks assessed    Cervical / Trunk Assessment Cervical / Trunk Assessment: Kyphotic  Communication   Communication: HOH  Cognition Arousal/Alertness: Awake/alert Behavior During Therapy: WFL for tasks assessed/performed Overall Cognitive Status: Within Functional Limits for tasks assessed                                        General Comments General comments (skin integrity, edema, etc.): VSS throughout session.     Exercises     Assessment/Plan    PT Assessment All further PT needs can be met in the next venue of care  PT Problem List Decreased strength;Decreased range of motion;Decreased balance;Decreased knowledge of use of DME;Decreased safety awareness;Pain       PT Treatment Interventions      PT Goals (Current goals can be found in the Care Plan section)  Acute Rehab PT Goals Patient Stated Goal: to go home with HHPT PT Goal Formulation: With patient Time For Goal Achievement: 02/05/17 Potential to Achieve Goals: Good    Frequency     Barriers to discharge        Co-evaluation               AM-PAC PT "6 Clicks" Daily Activity  Outcome Measure Difficulty turning over in bed (including adjusting bedclothes, sheets and blankets)?: A Little Difficulty moving from lying on back to sitting on the side of the bed? : A Little Difficulty sitting down on and standing up from a chair with arms (e.g., wheelchair, bedside commode, etc,.)?: A Little Help needed moving to and from a bed to chair (including a wheelchair)?: A Little Help needed walking in hospital room?: A Little Help needed climbing 3-5 steps with a railing? : A Little 6 Click Score: 18    End of Session Equipment Utilized During Treatment: Gait belt Activity Tolerance: Patient tolerated treatment well Patient left: in  chair;with call bell/phone within reach Nurse Communication: Mobility status PT Visit Diagnosis: Unsteadiness on feet (R26.81);History of falling (Z91.81);Muscle weakness (generalized) (M62.81);Other abnormalities of gait and mobility (R26.89);Pain    Time: 5183-3582 PT Time Calculation (min) (ACUTE ONLY): 32 min   Charges:   PT Evaluation $PT Eval Moderate Complexity: 1 Mod PT Treatments $Gait Training: 8-22 mins   PT G Codes:       Reinaldo Berber, PT, DPT Acute Rehab Services Pager: [phone]    Reinaldo Berber 01/22/2017, 2:01 PM

## 2017-01-22 NOTE — Discharge Summary (Addendum)
Physician Discharge Summary  [name] YIR:[id number] DOB: March 09, 1933 DOA: 01/20/2017  PCP: [name], MD  Admit date: 01/20/2017 Discharge date: 01/22/2017  Admitted From: Home Disposition:  Home  Recommendations for Outpatient Follow-up:  1. Follow up with PCP in 1 week 2. Continue foley catheter at discharge. Follow up with urologist at [facility] in 1 week.  3. Please obtain BMP in 1 week to ensure improvement in sodium level  4. Please follow up on the following pending results: final urine culture result  5. Continue follow-up with Coumadin clinic  Discharge Condition: Stable CODE STATUS: Full   Diet recommendation: Heart healthy   Brief/Interim Summary: DENO SIDA is a 83 year old man with past medical history significant for transcutaneous aortic valve replacement in 5009, systolic heart failure status post ICD, coronary artery disease status post CABG, prostate cancer status post radiation therapy, chronic back pain, and urinary retention presenting with abdominal pain. Patient reports having problems with urinary retention. He follows with urology at [facility]. He had his foley catheter removed about 3-4 weeks ago after passing voiding trial. He reports having constipation has not had a bowel movement in over 5 days, last able to urinate normally 6 days prior to admission. He states that his urination would only be 10 drops. He noted burning with urination and lower pelvis pain. He took 1 dose lasix to induce urination without success.   In the emergency department, patient had 540 mL of urine in his bladder with tenderness suprapubically. Foley was placed in the emergency department.  Suprapubic tenderness resolved after Foley placement.  Urinalysis revealed urinary tract infection and patient was empirically started on Levaquin.  Urine culture is pending at the time, however patient had clinical improvement with Levaquin.  He was also given IV fluids with improvement in  his hyponatremia.  Patient reported feeling much better, reported that he had 24-hour care givers at home and family felt comfortable with discharge home.  Discharge Diagnoses:  Active Problems:   Hyponatremia   Pressure injury of skin   Acute urinary retention   Acute urinary retention Hx prostate cancer s/p IMRT in Nov 2008  Follows with urology at [facility] Dr. Tacey Ruiz replaced Continue alfuzosin   Urinary tract infection Continue levaquin (PCN allergy), dose reduced per pharmacy recommendation  Await final urine culture, pending final result. He has been improving with levaquin and will be discharged on this antibiotic for 7 more days   Hyponatremia Suspect secondary to dehydration, poor PO intake  IV fluids, improving. Follow up outpatient BMP to ensure continued improvement. Chronically low Na level   Coronary artery disease s/p CABG  Continue aspirin  Chronic systolic heart failure Stable, continue coreg   S/p aortic valve replacement Permanent A Fib  Coumadin per pharmacy. INR goal 2.5-3   Hyperlipidemia Continue atorvastatin  Hypothyroidism Continue Synthroid  Constipation Resolved with lactulose and miralax.  Stage I Pressure Injury of Anus and Stage I Pressure Injury of Sacrum Supportive care     Discharge Instructions  Discharge Instructions    Call MD for:  difficulty breathing, headache or visual disturbances   Complete by:  As directed    Call MD for:  extreme fatigue   Complete by:  As directed    Call MD for:  hives   Complete by:  As directed    Call MD for:  persistant dizziness or light-headedness   Complete by:  As directed    Call MD for:  persistant  nausea and vomiting   Complete by:  As directed    Call MD for:  severe uncontrolled pain   Complete by:  As directed    Call MD for:  temperature >100.4   Complete by:  As directed    Continue foley catheter   Complete by:  As directed    Diet - low sodium heart  healthy   Complete by:  As directed    Discharge instructions   Complete by:  As directed    You were cared for by a hospitalist during your hospital stay. If you have any questions about your discharge medications or the care you received while you were in the hospital after you are discharged, you can call the unit and asked to speak with the hospitalist on call if the hospitalist that took care of you is not available. Once you are discharged, your primary care physician will handle any further medical issues. Please note that NO REFILLS for any discharge medications will be authorized once you are discharged, as it is imperative that you return to your primary care physician (or establish a relationship with a primary care physician if you do not have one) for your aftercare needs so that they can reassess your need for medications and monitor your lab values.   Increase activity slowly   Complete by:  As directed      Allergies as of 01/22/2017      Reactions   Penicillins Shortness Of Breath, Rash   Has patient had a PCN reaction causing immediate rash, facial/tongue/throat swelling, SOB or lightheadedness with hypotension: Yes Has patient had a PCN reaction causing severe rash involving mucus membranes or skin necrosis: No Has patient had a PCN reaction that required hospitalization No Has patient had a PCN reaction occurring within the last 10 years: No If all of the above answers are "NO", then may proceed with Cephalosporin use.   Tizanidine Shortness Of Breath   Light headed   Ace Inhibitors    Has not tolerated in the past due to hyperkalemia   Antihistamines, Diphenhydramine-type Other (See Comments)   Inhibits urination   Clemastine Fumarate Other (See Comments)   Inhibits urination   Amiodarone Other (See Comments)   Unknown       Medication List    TAKE these medications   acetaminophen 500 MG tablet Commonly known as:  TYLENOL Take 1,000 mg by mouth every 6 (six)  hours as needed for mild pain.   albuterol 108 (90 Base) MCG/ACT inhaler Commonly known as:  PROVENTIL HFA;VENTOLIN HFA Inhale 1-2 puffs into the lungs every 6 (six) hours as needed for wheezing or shortness of breath.   alfuzosin 10 MG 24 hr tablet Commonly known as:  UROXATRAL Take 10 mg by mouth at bedtime.   aspirin 81 MG tablet Take 81 mg by mouth daily.   atorvastatin 10 MG tablet Commonly known as:  LIPITOR Take 10 mg by mouth every other day.   benzonatate 100 MG capsule Commonly known as:  TESSALON Take 100 mg by mouth 3 (three) times daily as needed for cough.   carvedilol 6.25 MG tablet Commonly known as:  COREG TAKE 1 TABLET TWICE DAILY WITH A MEAL.   cholecalciferol 1000 units tablet Commonly known as:  VITAMIN D Take 1,000 Units by mouth 2 (two) times daily.   COUMADIN 5 MG tablet Generic drug:  warfarin Take as directed. If you are unsure how to take this medication, talk to your nurse  or doctor. Original instructions:  TAKE AS DIRECTED BY COUMADIN CLINIC. What changed:  See the new instructions.   dicyclomine 20 MG tablet Commonly known as:  BENTYL Take 1 tablet (20 mg total) by mouth 2 (two) times daily as needed for spasms.   furosemide 20 MG tablet Commonly known as:  LASIX TAKE (1) TABLET DAILY AS NEEDED FOR SWELLING.   ipratropium 0.03 % nasal spray Commonly known as:  ATROVENT Place 2 sprays into both nostrils 2 (two) times daily.   lactulose 10 g packet Commonly known as:  CEPHULAC Take 1 packet (10 g total) daily as needed by mouth (for severe constipation).   levofloxacin 250 MG tablet Commonly known as:  LEVAQUIN Take 1 tablet (250 mg total) at bedtime by mouth.   levothyroxine 125 MCG tablet Commonly known as:  SYNTHROID Take 1 tablet (125 mcg total) by mouth daily before breakfast.   LINZESS 145 MCG Caps capsule Generic drug:  linaclotide Take 145 mcg by mouth daily before breakfast.   nitroGLYCERIN 0.4 MG SL tablet Commonly  known as:  NITROSTAT Place 1 tablet (0.4 mg total) under the tongue every 5 (five) minutes x 3 doses as needed for chest pain (Max 3 doses in 15 min. Call 911).   omeprazole 20 MG capsule Commonly known as:  PRILOSEC Take 20 mg by mouth daily as needed (acid reflux).   polyethylene glycol packet Commonly known as:  MIRALAX / GLYCOLAX Take 17 g by mouth daily as needed for mild constipation.   PROBIOTIC DAILY PO Take 1 capsule by mouth daily.   RESTASIS 0.05 % ophthalmic emulsion Generic drug:  cycloSPORINE Place 1 drop into both eyes 2 (two) times daily as needed (dry eyes).   SYMBICORT IN Inhale 1 puff into the lungs 2 (two) times daily.   traMADol 50 MG tablet Commonly known as:  ULTRAM Take 50 mg by mouth every 6 (six) hours as needed for moderate pain.      Follow-up Information    Myrlene Broker, MD. Schedule an appointment as soon as possible for a visit in 1 week(s).   Specialty:  Urology Contact information: [place] [address] [phone]        [name], MD. Schedule an appointment as soon as possible for a visit in 1 week(s).   Specialty:  Internal Medicine Contact information: [place] [address] [phone]          Allergies  Allergen Reactions  . Penicillins Shortness Of Breath and Rash    Has patient had a PCN reaction causing immediate rash, facial/tongue/throat swelling, SOB or lightheadedness with hypotension: Yes Has patient had a PCN reaction causing severe rash involving mucus membranes or skin necrosis: No Has patient had a PCN reaction that required hospitalization No Has patient had a PCN reaction occurring within the last 10 years: No If all of the above answers are "NO", then may proceed with Cephalosporin use.   . Tizanidine Shortness Of Breath    Light headed  . Ace Inhibitors     Has not tolerated in the past due to hyperkalemia  . Antihistamines, Diphenhydramine-Type Other  (See Comments)    Inhibits urination  . Clemastine Fumarate Other (See Comments)    Inhibits urination  . Amiodarone Other (See Comments)    Unknown     Consultations:  None   Procedures/Studies: No results found.    Discharge Exam: Vitals:   01/22/17 0528 01/22/17 1036  BP:  115/64   Pulse: 67   Resp: 18   Temp: 97.9 F (36.6 C)   SpO2: 96% 97%   Vitals:   01/21/17 2032 01/21/17 2106 01/22/17 0528 01/22/17 1036  BP:  118/72 115/64   Pulse:  82 67   Resp:  18 18   Temp:  97.9 F (36.6 C) 97.9 F (36.6 C)   TempSrc:  Oral Oral   SpO2: 99% 97% 96% 97%  Weight:      Height:        General: Pt is alert, awake, not in acute distress Cardiovascular: S1/S2 +, no rubs, no gallops Respiratory: CTA bilaterally, no wheezing, no rhonchi Abdominal: Soft, NT, ND, bowel sounds + Extremities: no edema, no cyanosis    The results of significant diagnostics from this hospitalization (including imaging, microbiology, ancillary and laboratory) are listed below for reference.     Microbiology: Recent Results (from the past 240 hour(s))  Urine culture     Status: None (Preliminary result)   Collection Time: 01/20/17  9:42 PM  Result Value Ref Range Status   Specimen Description URINE, RANDOM  Final   Special Requests NONE  Final   Culture CULTURE REINCUBATED FOR BETTER GROWTH  Final   Report Status PENDING  Incomplete     Labs: BNP (last 3 results) Recent Labs    02/01/16 1111  BNP 741.2*   Basic Metabolic Panel: Recent Labs  Lab 01/20/17 2132 01/21/17 0427 01/22/17 0543  NA 122* 124* 125*  K 4.5 4.0 4.0  CL 88* 90* 94*  CO2  --  27 24  GLUCOSE 96 107* 89  BUN 14 11 7   CREATININE 0.70 0.84 0.85  CALCIUM  --  8.3* 8.0*   Liver Function Tests: No results for input(s): AST, ALT, ALKPHOS, BILITOT, PROT, ALBUMIN in the last 168 hours. No results for input(s): LIPASE, AMYLASE in the last 168 hours. No results for input(s): AMMONIA in the last 168  hours. CBC: Recent Labs  Lab 01/20/17 2120 01/20/17 2132 01/21/17 0427 01/22/17 0543  WBC 8.7  --  6.9 6.1  NEUTROABS 7.2  --   --   --   HGB 12.1* 12.6* 11.4* 11.7*  HCT 34.7* 37.0* 34.0* 34.7*  MCV 90.4  --  90.2 89.9  PLT 156  --  172 160   Cardiac Enzymes: No results for input(s): CKTOTAL, CKMB, CKMBINDEX, TROPONINI in the last 168 hours. BNP: Invalid input(s): POCBNP CBG: No results for input(s): GLUCAP in the last 168 hours. D-Dimer No results for input(s): DDIMER in the last 72 hours. Hgb A1c No results for input(s): HGBA1C in the last 72 hours. Lipid Profile No results for input(s): CHOL, HDL, LDLCALC, TRIG, CHOLHDL, LDLDIRECT in the last 72 hours. Thyroid function studies Recent Labs    01/21/17 0427  TSH 2.815   Anemia work up No results for input(s): VITAMINB12, FOLATE, FERRITIN, TIBC, IRON, RETICCTPCT in the last 72 hours. Urinalysis    Component Value Date/Time   COLORURINE YELLOW 01/20/2017 2041   APPEARANCEUR HAZY (A) 01/20/2017 2041   LABSPEC 1.013 01/20/2017 2041   LABSPEC 1.010 11/16/2006 1032   PHURINE 6.0 01/20/2017 2041   GLUCOSEU NEGATIVE 01/20/2017 2041   HGBUR LARGE (A) 01/20/2017 2041   BILIRUBINUR NEGATIVE 01/20/2017 2041   BILIRUBINUR Negative 11/16/2006 1032   KETONESUR 5 (A) 01/20/2017 2041   PROTEINUR NEGATIVE 01/20/2017 2041   UROBILINOGEN 0.2 11/25/2014 0510   NITRITE POSITIVE (A) 01/20/2017 2041   LEUKOCYTESUR MODERATE (A) 01/20/2017 2041  LEUKOCYTESUR Trace 11/16/2006 1032   Sepsis Labs Invalid input(s): PROCALCITONIN,  WBC,  LACTICIDVEN Microbiology Recent Results (from the past 240 hour(s))  Urine culture     Status: None (Preliminary result)   Collection Time: 01/20/17  9:42 PM  Result Value Ref Range Status   Specimen Description URINE, RANDOM  Final   Special Requests NONE  Final   Culture CULTURE REINCUBATED FOR BETTER GROWTH  Final   Report Status PENDING  Incomplete     Time coordinating discharge: 40  minutes  SIGNED:  Dessa Phi, DO [facility] Pager [phone]  If 7PM-7AM, please contact night-coverage [email or web address] Password [email or web address] 01/22/2017, 1:48 PM

## 2017-01-22 NOTE — Care Management Note (Addendum)
Case Management Note  Patient Details  Name: [name] MRN: [id number] Date of Birth: 05-24-1932  Subjective/Objective:                    Action/Plan:  Patient has walker at home, raised commode seat, and does not want 3 in 1  Expected Discharge Date:  01/22/17               Expected Discharge Plan:  [place]  In-House Referral:     Discharge planning Services  CM Consult  Post Acute Care Choice:    Choice offered to:  Patient  DME Arranged:    DME Agency:     HH Arranged:  RN, PT [place] Agency:  Friend  Status of Service:  Completed, signed off  If discussed at [place] of Stay Meetings, dates discussed:    Additional Comments:  Marilu Favre, RN 01/22/2017, 2:27 PM

## 2017-01-22 NOTE — Evaluation (Signed)
Occupational Therapy Evaluation Patient Details Name: [name] MRN: [id number] DOB: 1932-12-03 Today's Date: 01/22/2017    History of Present Illness Pt admitted with urinary retention, UTI, hyponatremia. PMH: falls, AVR 2841, systolic HF, s/p ICD, CAD with CABG, prostate cancer s/p radiation, chronic back pain.    Clinical Impression   Pt was modified independent in self care and ambulated with a rollator prior to admission. Pt presents with decreased balance and history of falls. Pt has 24 hour caregivers at home. Will follow acutely.     Follow Up Recommendations  No OT follow up    Equipment Recommendations  None recommended by OT    Recommendations for Other Services       Precautions / Restrictions Precautions Precautions: Fall Restrictions Weight Bearing Restrictions: No      Mobility Bed Mobility Overal bed mobility: Modified Independent             General bed mobility comments: increased time, no assist  Transfers Overall transfer level: Needs assistance Equipment used: Rolling walker (2 wheeled) Transfers: Sit to/from Stand Sit to Stand: Min guard         General transfer comment: good technique, required 2 trials to achieve standing from bed    Balance Overall balance assessment: Needs assistance   Sitting balance-Leahy Scale: Good       Standing balance-Leahy Scale: Poor                             ADL either performed or assessed with clinical judgement   ADL Overall ADL's : Needs assistance/impaired Eating/Feeding: Independent;Sitting   Grooming: Wash/dry hands;Standing;Min guard   Upper Body Bathing: Supervision/ safety;Sitting   Lower Body Bathing: Min guard;Sit to/from stand   Upper Body Dressing : Set up;Sitting   Lower Body Dressing: Min guard;Sit to/from stand   Toilet Transfer: Min guard;Ambulation;RW;BSC   Toileting- Water quality scientist and Hygiene: Min guard;Sit to/from stand       Functional  mobility during ADLs: Min guard;Rolling walker General ADL Comments: Pt happy to be OOB in chair.     Vision Patient Visual Report: No change from baseline       Perception     Praxis      Pertinent Vitals/Pain Pain Assessment: Faces Faces Pain Scale: Hurts a little bit Pain Location: back Pain Descriptors / Indicators: Aching Pain Intervention(s): Monitored during session;Repositioned     Hand Dominance Right   Extremity/Trunk Assessment Upper Extremity Assessment Upper Extremity Assessment: Overall WFL for tasks assessed   Lower Extremity Assessment Lower Extremity Assessment: Defer to PT evaluation   Cervical / Trunk Assessment Cervical / Trunk Assessment: Kyphotic(hx of multiple fractures)   Communication Communication Communication: HOH(hearing aid)   Cognition Arousal/Alertness: Awake/alert Behavior During Therapy: WFL for tasks assessed/performed Overall Cognitive Status: Within Functional Limits for tasks assessed                                     General Comments       Exercises     Shoulder Instructions      Home Living Family/patient expects to be discharged to:: Private residence Living Arrangements: Spouse/significant other Available Help at Discharge: Family;Personal care attendant;Available 24 hours/day Type of Home: House Home Access: Stairs to enter [employer] of Steps: 3 Entrance Stairs-Rails: Left Home Layout: Two level;Able to live on main level with bedroom/bathroom  Bathroom Shower/Tub: Occupational psychologist: Handicapped height     Home Equipment: Environmental consultant - 4 wheels;Shower seat;Grab bars - tub/shower;Grab bars - toilet;Hand held shower head          Prior Functioning/Environment Level of Independence: Independent with assistive device(s);Needs assistance  Gait / Transfers Assistance Needed: walks with rollator ADL's / Homemaking Assistance Needed: assist for IADL             OT Problem List: Impaired balance (sitting and/or standing)      OT Treatment/Interventions: Self-care/ADL training;DME and/or AE instruction;Patient/family education;Balance training;Therapeutic activities    OT Goals(Current goals can be found in the care plan section) Acute Rehab OT Goals Patient Stated Goal: to go home OT Goal Formulation: With patient Time For Goal Achievement: 01/29/17 Potential to Achieve Goals: Good ADL Goals Pt Will Perform Grooming: with modified independence;standing Pt Will Perform Lower Body Bathing: with supervision;sit to/from stand Pt Will Perform Lower Body Dressing: with modified independence;sit to/from stand Pt Will Transfer to Toilet: with modified independence;ambulating Pt Will Perform Toileting - Clothing Manipulation and hygiene: with modified independence;sit to/from stand Pt Will Perform Tub/Shower Transfer: with supervision;ambulating;shower seat;rolling walker  OT Frequency: Min 2X/week   Barriers to D/C:            Co-evaluation              AM-PAC PT "6 Clicks" Daily Activity     Outcome Measure Help from another person eating meals?: None Help from another person taking care of personal grooming?: A Little Help from another person toileting, which includes using toliet, bedpan, or urinal?: A Little Help from another person bathing (including washing, rinsing, drying)?: A Little Help from another person to put on and taking off regular upper body clothing?: None Help from another person to put on and taking off regular lower body clothing?: A Little 6 Click Score: 20   End of Session Equipment Utilized During Treatment: Gait belt;Rolling walker  Activity Tolerance: Patient tolerated treatment well Patient left: in chair;with call bell/phone within reach  OT Visit Diagnosis: Unsteadiness on feet (R26.81);Other abnormalities of gait and mobility (R26.89);History of falling (Z91.81);Pain                Time: 5374-8270 OT  Time Calculation (min): 34 min Charges:  OT General Charges $OT Visit: 1 Visit OT Evaluation $OT Eval Moderate Complexity: 1 Mod OT Treatments $Self Care/Home Management : 8-22 mins G-Codes:     January 27, 2017 Nestor Lewandowsky, OTR/L Pager: [phone]  Werner Lean, Haze Boyden 01-27-17, 1:26 PM

## 2017-01-22 NOTE — Progress Notes (Signed)
ANTICOAGULATION CONSULT NOTE - Initial Consult  Pharmacy Consult for warfarin Indication: atrial fibrillation and hx stroke  Allergies  Allergen Reactions  . Penicillins Shortness Of Breath and Rash    Has patient had a PCN reaction causing immediate rash, facial/tongue/throat swelling, SOB or lightheadedness with hypotension: Yes Has patient had a PCN reaction causing severe rash involving mucus membranes or skin necrosis: No Has patient had a PCN reaction that required hospitalization No Has patient had a PCN reaction occurring within the last 10 years: No If all of the above answers are "NO", then may proceed with Cephalosporin use.   . Tizanidine Shortness Of Breath    Light headed  . Ace Inhibitors     Has not tolerated in the past due to hyperkalemia  . Antihistamines, Diphenhydramine-Type Other (See Comments)    Inhibits urination  . Clemastine Fumarate Other (See Comments)    Inhibits urination  . Amiodarone Other (See Comments)    Unknown     Patient Measurements: Height: 6\' 2"  (188 cm) Weight: 190 lb (86.2 kg) IBW/kg (Calculated) : 82.2  Vital Signs: Temp: 97.9 F (36.6 C) (11/12 0528) Temp Source: Oral (11/12 0528) BP: 115/64 (11/12 0528) Pulse Rate: 67 (11/12 0528)  Labs: Recent Labs    01/20/17 2120 01/20/17 2132 01/21/17 0427 01/22/17 0543  HGB 12.1* 12.6* 11.4* 11.7*  HCT 34.7* 37.0* 34.0* 34.7*  PLT 156  --  172 160  LABPROT 37.3*  --  32.4* 27.4*  INR 3.81  --  3.19 2.57  CREATININE  --  0.70 0.84 0.85    Estimated Creatinine Clearance: 77.9 mL/min (by C-G formula based on SCr of 0.85 mg/dL).   Medical History: Past Medical History:  Diagnosis Date  . AF (atrial fibrillation) ([place])    Has not tolerated amiodarone in the past. Amiodarone was stopped in September of 2010 due to side effects  . Aortic stenosis, severe    WITH PERCUTANEOUS AORTIC VALVE (TAVI) IN March 2012 at the [facility]  . Cervical myelopathy ([place])   . CHF  (congestive heart failure) ([place])   . Chronic anticoagulation    on coumadin  . Coronary artery disease   . Diverticulitis    CURRENTLY CONTROLLED WITH NO EVIDENCE OF RECURRENT INFECTION  . Esophageal stricture    WITH DILATATION  . GERD (gastroesophageal reflux disease)   . Heart murmur   . Hyperlipidemia   . Ischemic cardiomyopathy 05/2010   Has EF of 25%  . MI (myocardial infarction) ([place]) 1974, 1978  . NSVT (nonsustained ventricular tachycardia) ([place])   . Osteoarthritis    RIGHT KNEE  . Osteoporosis   . Prostate cancer ([place])   . Pulmonary embolism ([place])   . Stroke [facility])    3 strokes    Medications:  Medications Prior to Admission  Medication Sig Dispense Refill Last Dose  . acetaminophen (TYLENOL) 500 MG tablet Take 1,000 mg by mouth every 6 (six) hours as needed for mild pain.    unk  . albuterol (PROVENTIL HFA;VENTOLIN HFA) 108 (90 Base) MCG/ACT inhaler Inhale 1-2 puffs into the lungs every 6 (six) hours as needed for wheezing or shortness of breath.   unk  . alfuzosin (UROXATRAL) 10 MG 24 hr tablet Take 10 mg by mouth at bedtime.    01/19/2017 at Unknown time  . aspirin 81 MG tablet Take 81 mg by mouth daily.   01/20/2017 at 0800  . atorvastatin (LIPITOR) 10 MG tablet Take 10 mg by mouth every other day.  Past Week at Unknown time  . benzonatate (TESSALON) 100 MG capsule Take 100 mg by mouth 3 (three) times daily as needed for cough.   unk  . Budesonide-Formoterol Fumarate (SYMBICORT IN) Inhale 1 puff into the lungs 2 (two) times daily.   01/20/2017 at Unknown time  . carvedilol (COREG) 6.25 MG tablet TAKE 1 TABLET TWICE DAILY WITH A MEAL. 180 tablet 2 01/20/2017 at 0800  . cholecalciferol (VITAMIN D) 1000 UNITS tablet Take 1,000 Units by mouth 2 (two) times daily.   01/20/2017 at Unknown time  . COUMADIN 5 MG tablet TAKE AS DIRECTED BY COUMADIN CLINIC. (Patient taking differently: Take 1 tablet every evening.) 100 tablet 0 01/18/2017 at 2000  . dicyclomine (BENTYL) 20 MG  tablet Take 1 tablet (20 mg total) by mouth 2 (two) times daily as needed for spasms. 20 tablet 0 unk  . furosemide (LASIX) 20 MG tablet TAKE (1) TABLET DAILY AS NEEDED FOR SWELLING. 30 tablet 0 unk  . ipratropium (ATROVENT) 0.03 % nasal spray Place 2 sprays into both nostrils 2 (two) times daily.   01/20/2017 at Unknown time  . levothyroxine (SYNTHROID) 125 MCG tablet Take 1 tablet (125 mcg total) by mouth daily before breakfast. 30 tablet 0 01/20/2017 at Unknown time  . linaclotide (LINZESS) 145 MCG CAPS capsule Take 145 mcg by mouth daily before breakfast.   01/20/2017 at Unknown time  . nitroGLYCERIN (NITROSTAT) 0.4 MG SL tablet Place 1 tablet (0.4 mg total) under the tongue every 5 (five) minutes x 3 doses as needed for chest pain (Max 3 doses in 15 min. Call 911). 25 tablet 1 unk  . omeprazole (PRILOSEC) 20 MG capsule Take 20 mg by mouth daily as needed (acid reflux).   unk  . polyethylene glycol (MIRALAX / GLYCOLAX) packet Take 17 g by mouth daily as needed for mild constipation.    unk  . Probiotic Product (PROBIOTIC DAILY PO) Take 1 capsule by mouth daily.   01/19/2017 at Unknown time  . RESTASIS 0.05 % ophthalmic emulsion Place 1 drop into both eyes 2 (two) times daily as needed (dry eyes).    unk  . traMADol (ULTRAM) 50 MG tablet Take 50 mg by mouth every 6 (six) hours as needed for moderate pain.   unk    Assessment: 83 y/o male admitted 01/20/2017 with abdominal pain. He takes warfarin PTA for Afib and hx stroke. Pharmacy consulted to manage warfarin inpatient. INR was supratherapeutic on admit at 3.81.  INR down to goal range at 2.57 today. No bleeding noted, CBC is stable. On Levaquin which can increase the effects of warfarin - watch INR closely.   PTA regimen: 5 mg daily  Goal of Therapy:  INR 2.5-3 per outpatient record Monitor platelets by anticoagulation protocol: Yes   Plan:  Warfarin 5 mg PO tonight Daily INR Monitor for s/sx of bleeding Consider decreasing Levaquin  to 250 mg/d for UTI   Renold Genta, PharmD, BCPS Clinical Pharmacist Phone for today - [place] - [phone] 01/22/2017 1:01 PM

## 2017-01-23 ENCOUNTER — Ambulatory Visit (INDEPENDENT_AMBULATORY_CARE_PROVIDER_SITE_OTHER): Payer: Medicare Other | Admitting: Cardiology

## 2017-01-23 ENCOUNTER — Telehealth: Payer: Self-pay | Admitting: Pharmacist

## 2017-01-23 DIAGNOSIS — I482 Chronic atrial fibrillation, unspecified: Secondary | ICD-10-CM

## 2017-01-23 DIAGNOSIS — Z5181 Encounter for therapeutic drug level monitoring: Secondary | ICD-10-CM

## 2017-01-23 NOTE — Telephone Encounter (Signed)
Anticoagulation clearance request received by [facility] that pt will require lumbar facet injections, date of procedure TBD. Pt is on warfarin for AFib with CHADSVASc = 6 (age >57, hx PE and 3 prior strokes, CHF, CAD). Pt will need to hold warfarin for 5 days prior to procedure and be bridged with Lovenox. Clearance faxed to #[phone].

## 2017-01-24 DIAGNOSIS — Z8546 Personal history of malignant neoplasm of prostate: Secondary | ICD-10-CM | POA: Diagnosis not present

## 2017-01-24 DIAGNOSIS — N320 Bladder-neck obstruction: Secondary | ICD-10-CM | POA: Diagnosis not present

## 2017-01-24 DIAGNOSIS — R110 Nausea: Secondary | ICD-10-CM | POA: Diagnosis not present

## 2017-01-24 DIAGNOSIS — R339 Retention of urine, unspecified: Secondary | ICD-10-CM | POA: Diagnosis not present

## 2017-01-24 LAB — URINE CULTURE

## 2017-01-25 ENCOUNTER — Encounter (HOSPITAL_COMMUNITY): Payer: Self-pay

## 2017-01-25 ENCOUNTER — Emergency Department (HOSPITAL_COMMUNITY)
Admission: EM | Admit: 2017-01-25 | Discharge: 2017-01-25 | Disposition: A | Discharge status: Home / Self Care | Payer: Medicare Other | Attending: Emergency Medicine | Admitting: Emergency Medicine

## 2017-01-25 DIAGNOSIS — R102 Pelvic and perineal pain: Secondary | ICD-10-CM | POA: Diagnosis not present

## 2017-01-25 DIAGNOSIS — R339 Retention of urine, unspecified: Secondary | ICD-10-CM

## 2017-01-25 DIAGNOSIS — Z79899 Other long term (current) drug therapy: Secondary | ICD-10-CM | POA: Insufficient documentation

## 2017-01-25 DIAGNOSIS — Z951 Presence of aortocoronary bypass graft: Secondary | ICD-10-CM | POA: Diagnosis not present

## 2017-01-25 DIAGNOSIS — E039 Hypothyroidism, unspecified: Secondary | ICD-10-CM | POA: Diagnosis not present

## 2017-01-25 DIAGNOSIS — I2510 Atherosclerotic heart disease of native coronary artery without angina pectoris: Secondary | ICD-10-CM | POA: Diagnosis not present

## 2017-01-25 DIAGNOSIS — I5022 Chronic systolic (congestive) heart failure: Secondary | ICD-10-CM | POA: Diagnosis not present

## 2017-01-25 DIAGNOSIS — N3000 Acute cystitis without hematuria: Secondary | ICD-10-CM

## 2017-01-25 DIAGNOSIS — B9689 Other specified bacterial agents as the cause of diseases classified elsewhere: Secondary | ICD-10-CM | POA: Diagnosis not present

## 2017-01-25 DIAGNOSIS — Z87891 Personal history of nicotine dependence: Secondary | ICD-10-CM | POA: Insufficient documentation

## 2017-01-25 LAB — URINALYSIS, ROUTINE W REFLEX MICROSCOPIC
BACTERIA UA: NONE SEEN
Bilirubin Urine: NEGATIVE
GLUCOSE, UA: NEGATIVE mg/dL
KETONES UR: NEGATIVE mg/dL
Leukocytes, UA: NEGATIVE
Nitrite: NEGATIVE
PROTEIN: NEGATIVE mg/dL
Specific Gravity, Urine: 1.008 (ref 1.005–1.030)
Squamous Epithelial / LPF: NONE SEEN
pH: 6 (ref 5.0–8.0)

## 2017-01-25 LAB — I-STAT CHEM 8, ED
BUN: 9 mg/dL (ref 6–20)
CALCIUM ION: 1.19 mmol/L (ref 1.15–1.40)
Chloride: 87 mmol/L — ABNORMAL LOW (ref 101–111)
Creatinine, Ser: 0.9 mg/dL (ref 0.61–1.24)
GLUCOSE: 94 mg/dL (ref 65–99)
HCT: 39 % (ref 39.0–52.0)
HEMOGLOBIN: 13.3 g/dL (ref 13.0–17.0)
POTASSIUM: 4.3 mmol/L (ref 3.5–5.1)
SODIUM: 125 mmol/L — AB (ref 135–145)
TCO2: 28 mmol/L (ref 22–32)

## 2017-01-25 LAB — PROTIME-INR
INR: 2.03
PROTHROMBIN TIME: 22.8 s — AB (ref 11.4–15.2)

## 2017-01-25 MED ORDER — DOXYCYCLINE HYCLATE 100 MG PO TABS
100.0000 mg | ORAL_TABLET | Freq: Once | ORAL | Status: AC
  Administered 2017-01-25: 100 mg via ORAL
  Filled 2017-01-25: qty 1

## 2017-01-25 MED ORDER — ALUM & MAG HYDROXIDE-SIMETH 200-200-20 MG/5ML PO SUSP
30.0000 mL | Freq: Once | ORAL | Status: AC
  Administered 2017-01-25: 30 mL via ORAL
  Filled 2017-01-25: qty 30

## 2017-01-25 MED ORDER — DOXYCYCLINE HYCLATE 100 MG PO CAPS: 100.0000 mg | ORAL_CAPSULE | Freq: Two times a day (BID) | ORAL | 0 refills | Status: DC

## 2017-01-25 MED ORDER — LIDOCAINE HCL 2 % EX GEL
1.0000 "application " | Freq: Once | CUTANEOUS | Status: AC | PRN
  Administered 2017-01-25: 1 via URETHRAL
  Filled 2017-01-25: qty 11

## 2017-01-25 NOTE — ED Triage Notes (Signed)
Pt arrived via [facility], complaints of groin pain and blood after cath attempt. Pt had a temp cath for x5 days, cone pulled temp cath yesterday. Home health nurse reports being told to in and out if difficulty urinating continues. Cath was attempted tonight and pt reports bleeding after unsucessful attempt. Pt has a hx of prostate cancer about 5 years ago. Alert and oriented, main complaint is groin pain 10/10.

## 2017-01-25 NOTE — ED Provider Notes (Signed)
[facility] DEPT Provider Note   CSN: [id number] Arrival date & time: 01/25/17  0244     History   Chief Complaint Chief Complaint  Patient presents with  . Groin Pain    HPI [name] is a 83 y.o. male.  The history is provided by the patient.  Illness  This is a recurrent problem. The current episode started 6 to 12 hours ago. The problem occurs constantly. The problem has not changed since onset.Pertinent negatives include no chest pain, no abdominal pain, no headaches and no shortness of breath. Nothing aggravates the symptoms. Nothing relieves the symptoms. He has tried nothing for the symptoms. The treatment provided no relief.  Patient with a history of urinary retention who presents with retention since 6 pm.  Patient had the foley catheter in the [place] and it was removed at [facility] at the five day mark.  As patient was not voiding a home health nurse attempted replacement and was unable to do so and there was bleeding at the meatus and EMS was called.    Past Medical History:  Diagnosis Date  . AF (atrial fibrillation) ([place])    Has not tolerated amiodarone in the past. Amiodarone was stopped in September of 2010 due to side effects  . Aortic stenosis, severe    WITH PERCUTANEOUS AORTIC VALVE (TAVI) IN March 2012 at the [facility]  . Cervical myelopathy ([place])   . CHF (congestive heart failure) ([place])   . Chronic anticoagulation    on coumadin  . Coronary artery disease   . Diverticulitis    CURRENTLY CONTROLLED WITH NO EVIDENCE OF RECURRENT INFECTION  . Esophageal stricture    WITH DILATATION  . GERD (gastroesophageal reflux disease)   . Heart murmur   . Hyperlipidemia   . Ischemic cardiomyopathy 05/2010   Has EF of 25%  . MI (myocardial infarction) ([place]) 1974, 1978  . NSVT (nonsustained ventricular tachycardia) ([place])   . Osteoarthritis    RIGHT KNEE  . Osteoporosis   . Prostate cancer ([place])   .  Pulmonary embolism ([place])   . Stroke [facility])    3 strokes    Patient Active Problem List   Diagnosis Date Noted  . Pressure injury of skin 01/21/2017  . Acute urinary retention 01/21/2017  . Shortness of breath   . Weakness 09/01/2015  . Hyponatremia 09/01/2015  . Hypothyroidism 09/01/2015  . DOE (dyspnea on exertion) 09/01/2015  . Chronic systolic CHF (congestive heart failure) ([place]) 09/01/2015  . Exertional dyspnea 09/01/2015  . GI bleed 01/16/2014  . GIB (gastrointestinal bleeding) 01/16/2014  . Encounter for therapeutic drug monitoring 05/09/2013  . Automatic implantable cardioverter-defibrillator in situ 12/13/2012  . Rectal bleed 05/30/2012    Class: Acute  . Acute low back pain 05/30/2012    Class: Acute  . Hypotension 05/30/2012    Class: Acute  . Vertebral fracture, osteoporotic ([place]) 05/21/2012  . Gait instability 12/02/2011    Class: Acute  . Atrial fibrillation, chronic ([place]) 12/02/2011    Class: Chronic  . Anticoagulated on Coumadin 12/02/2011  . Deformity, finger acquired 12/19/2010  . Chronic cough 12/05/2010  . S/P aortic valve replacement 10/22/2010  . Ischemic cardiomyopathy   . Diverticulitis   . Prostate cancer ([place])   . Osteoarthritis   . Cervical myelopathy ([place])   . Pulmonary embolism ([place])   . Hyperlipidemia   . GERD (gastroesophageal reflux disease)   . Esophageal stricture   . Indigestion 06/29/2010  .  VENTRICULAR TACHYCARDIA 01/15/2009    Past Surgical History:  Procedure Laterality Date  . AORTIC VALVE REPLACEMENT     Percutaneous AVR in March 2012 at the [facility]  . BACK SURGERY    . CARDIAC CATHETERIZATION  2010   SEVERE LV DYSFUNCTION WITH ESTIMATED EJECTION FRACTION OF 25%  . CARDIOVERSION  02/16/2011   Procedure: CARDIOVERSION;  Surgeon: Carlena Bjornstad, MD;  Location: [facility];  Service: Cardiovascular;  Laterality: N/A;  . CHOLECYSTECTOMY    . CORONARY ARTERY BYPASS GRAFT  1979  . CORONARY ARTERY BYPASS GRAFT  1991   REDO  SURGERY  . ICD  Feb 2003; 02/2013   gen change 02-11-2013 by Dr Caryl Comes  . IMPLANTABLE CARDIOVERTER DEFIBRILLATOR (ICD) GENERATOR CHANGE N/A 02/10/2013   Procedure: ICD GENERATOR CHANGE;  Surgeon: Deboraha Sprang, MD;  Location: [facility] CATH LAB;  Service: Cardiovascular;  Laterality: N/A;  . SHOULDER SURGERY         Home Medications    Prior to Admission medications   Medication Sig Start Date End Date Taking? Authorizing Provider  acetaminophen (TYLENOL) 500 MG tablet Take 1,000 mg by mouth every 6 (six) hours as needed for mild pain.     [provider]  albuterol (PROVENTIL HFA;VENTOLIN HFA) 108 (90 Base) MCG/ACT inhaler Inhale 1-2 puffs into the lungs every 6 (six) hours as needed for wheezing or shortness of breath.    [provider]  alfuzosin (UROXATRAL) 10 MG 24 hr tablet Take 10 mg by mouth at bedtime.     [provider]  aspirin 81 MG tablet Take 81 mg by mouth daily.    [provider]  atorvastatin (LIPITOR) 10 MG tablet Take 10 mg by mouth every other day.    [provider]  benzonatate (TESSALON) 100 MG capsule Take 100 mg by mouth 3 (three) times daily as needed for cough.    [provider]  Budesonide-Formoterol Fumarate (SYMBICORT IN) Inhale 1 puff into the lungs 2 (two) times daily.    [provider]  carvedilol (COREG) 6.25 MG tablet TAKE 1 TABLET TWICE DAILY WITH A MEAL. 07/24/16   Sherren Mocha, MD  cholecalciferol (VITAMIN D) 1000 UNITS tablet Take 1,000 Units by mouth 2 (two) times daily.    [provider]  COUMADIN 5 MG tablet TAKE AS DIRECTED BY COUMADIN CLINIC. Patient taking differently: Take 1 tablet every evening. 11/17/16   Sherren Mocha, MD  dicyclomine (BENTYL) 20 MG tablet Take 1 tablet (20 mg total) by mouth 2 (two) times daily as needed for spasms. 07/06/16   Carlisle Cater, PA-C  doxycycline (VIBRAMYCIN) 100 MG capsule Take 1 capsule (100 mg total) 2 (two) times daily by mouth. One po  bid x 7 days 01/25/17   Kevontay Burks, MD  furosemide (LASIX) 20 MG tablet TAKE (1) TABLET DAILY AS NEEDED FOR SWELLING. 06/19/16   Sherren Mocha, MD  ipratropium (ATROVENT) 0.03 % nasal spray Place 2 sprays into both nostrils 2 (two) times daily.    [provider]  lactulose (CEPHULAC) 10 g packet Take 1 packet (10 g total) daily as needed by mouth (for severe constipation). 01/22/17   Dessa Phi, DO  levofloxacin (LEVAQUIN) 250 MG tablet Take 1 tablet (250 mg total) at bedtime by mouth. 01/22/17   Dessa Phi, DO  levothyroxine (SYNTHROID) 125 MCG tablet Take 1 tablet (125 mcg total) by mouth daily before breakfast. 09/02/15   Hosie Poisson, MD  linaclotide (LINZESS) 145 MCG CAPS capsule Take 145  mcg by mouth daily before breakfast.    [provider]  nitroGLYCERIN (NITROSTAT) 0.4 MG SL tablet Place 1 tablet (0.4 mg total) under the tongue every 5 (five) minutes x 3 doses as needed for chest pain (Max 3 doses in 15 min. Call 911). 12/06/16   Sherren Mocha, MD  omeprazole (PRILOSEC) 20 MG capsule Take 20 mg by mouth daily as needed (acid reflux).    [provider]  polyethylene glycol (MIRALAX / GLYCOLAX) packet Take 17 g by mouth daily as needed for mild constipation.     [provider]  Probiotic Product (PROBIOTIC DAILY PO) Take 1 capsule by mouth daily.    [provider]  RESTASIS 0.05 % ophthalmic emulsion Place 1 drop into both eyes 2 (two) times daily as needed (dry eyes).  12/05/11   [provider]  traMADol (ULTRAM) 50 MG tablet Take 50 mg by mouth every 6 (six) hours as needed for moderate pain.    [provider]    Family History Family History  Problem Relation Age of Onset  . Heart disease Mother 33  . Diabetes Mother 24  . Heart disease Father 97    Social History Social History   Tobacco Use  . Smoking status: Former Smoker    Packs/day: 4.00    Years: 15.00    Pack years: 60.00    Types:  Cigarettes    Last attempt to quit: 06/15/1969    Years since quitting: 47.6  . Smokeless tobacco: Never Used  Substance Use Topics  . Alcohol use: No  . Drug use: No     Allergies   Penicillins; Tizanidine; Ace inhibitors; Antihistamines, diphenhydramine-type; Clemastine fumarate; and Amiodarone   Review of Systems Review of Systems  Respiratory: Negative for shortness of breath.   Cardiovascular: Negative for chest pain.  Gastrointestinal: Negative for abdominal pain.  Genitourinary: Positive for difficulty urinating. Negative for dysuria.  Neurological: Negative for headaches.  All other systems reviewed and are negative.    Physical Exam Updated Vital Signs BP 123/68 (BP Location: Left Arm)   Pulse 67   Temp (!) 97.5 F (36.4 C) (Oral)   Resp 18   SpO2 100% Comment: Simultaneous filing. User may not have seen previous data.  Physical Exam  Constitutional: He is oriented to person, place, and time. He appears well-developed and well-nourished. No distress.  HENT:  Head: Normocephalic and atraumatic.  Nose: Nose normal.  Mouth/Throat: No oropharyngeal exudate.  Eyes: Conjunctivae are normal. Pupils are equal, round, and reactive to light.  Neck: Normal range of motion. Neck supple. No JVD present.  Cardiovascular: Normal rate, normal heart sounds and intact distal pulses. An irregularly irregular rhythm present.  Pulmonary/Chest: Effort normal and breath sounds normal. No stridor. No respiratory distress. He has no wheezes. He has no rales.  Abdominal: Soft. Bowel sounds are normal. He exhibits no mass. There is no tenderness. There is no rebound and no guarding.  Musculoskeletal: Normal range of motion. He exhibits no edema or deformity.  Neurological: He is alert and oriented to person, place, and time. He displays normal reflexes.  Skin: Skin is warm and dry. Capillary refill takes less than 2 seconds.  Nursing note and vitals reviewed.    ED Treatments /  Results  Labs (all labs ordered are listed, but only abnormal results are displayed)  Results for orders placed or performed during the hospital encounter of 01/25/17  Urinalysis, Routine w reflex microscopic- may I&O cath if menses  Result Value Ref Range   Color, Urine YELLOW YELLOW   APPearance CLEAR CLEAR   Specific Gravity, Urine 1.008 1.005 - 1.030   pH 6.0 5.0 - 8.0   Glucose, UA NEGATIVE NEGATIVE mg/dL   Hgb urine dipstick MODERATE (A) NEGATIVE   Bilirubin Urine NEGATIVE NEGATIVE   Ketones, ur NEGATIVE NEGATIVE mg/dL   Protein, ur NEGATIVE NEGATIVE mg/dL   Nitrite NEGATIVE NEGATIVE   Leukocytes, UA NEGATIVE NEGATIVE   RBC / HPF 0-5 0 - 5 RBC/hpf   WBC, UA 0-5 0 - 5 WBC/hpf   Bacteria, UA NONE SEEN NONE SEEN   Squamous Epithelial / LPF NONE SEEN NONE SEEN   Mucus PRESENT   Protime-INR  Result Value Ref Range   Prothrombin Time 22.8 (H) 11.4 - 15.2 seconds   INR 2.03   I-Stat Chem 8, ED  Result Value Ref Range   Sodium 125 (L) 135 - 145 mmol/L   Potassium 4.3 3.5 - 5.1 mmol/L   Chloride 87 (L) 101 - 111 mmol/L   BUN 9 6 - 20 mg/dL   Creatinine, Ser 0.90 0.61 - 1.24 mg/dL   Glucose, Bld 94 65 - 99 mg/dL   Calcium, Ion 1.19 1.15 - 1.40 mmol/L   TCO2 28 22 - 32 mmol/L   Hemoglobin 13.3 13.0 - 17.0 g/dL   HCT 39.0 39.0 - 52.0 %   *Note: Due to a large number of results and/or encounters for the requested time period, some results have not been displayed. A complete set of results can be found in Results Review.   No results found.   Procedures Procedures (including critical care time)  Medications Ordered in ED Medications  lidocaine (XYLOCAINE) 2 % jelly 1 application (1 application Urethral Given 01/25/17 0320)  doxycycline (VIBRA-TABS) tablet 100 mg (100 mg Oral Given 01/25/17 0502)   EDP wore a mask in room secondary to cold.  Patient was informed of this immediately and expressed understanding.  Terri and Juanda Crumble were present.    Symptoms markedly  improved post foley placement in the ED.   EDP was informed by Orma Flaming (phlebotomy) that lab had not been drawn as family was requesting several additional lab tests  EDP immediately went to speak with patient and daughter who would like a full laboratory work up including PSA.  EDP politely stated that PSA would not return during the visit and we do not customarily order this.  Patient was advised to follow up with his urologist at [facility] for this test.  Patient would like INR drawn as he does not feel like going to coumadin clinic later today.  This was ordered immediately. [place] immediately drew these labs.    EDP had ordered doxycycline based on culture results initially.  But daughter now states patient is taking Levaquin and that they were told this was best given coumadin, as urine is markedly improved today EDP stated they should continue this medication.  Patient's INR was provided both verbally and on discharge papers.    Foley catheter bag changed to leg bag.    Final Clinical Impressions(s) / ED Diagnoses   Final diagnoses:  Urinary retention  Acute cystitis without hematuria   Please have foley catheter left in for 7 days.  Patient and daughter verbalized understanding and agree to follow up.  EDP also advised patient to call coumadin clinic for instructions on adjustment of his dose of coumadin based on INR as he states he has narrow range.  He verbalized understanding.  Patient asked for maalox for his GERD, EDP immediately complied with his request and ordered medication in Epic.    EDP visited several times and repeatedly asked if the family had any additional needs or questions.  All questions answered to the patient's satisfaction, he stated that all questions had been answered.   Strict return precautions for fever, global weakness, blood in the urine, abdominal distention, vomiting, no drainage from the foley catheter, swelling or the lips or tongue, chest pain,  dyspnea on exertion, new weakness or numbness changes in vision or speech, fevers, weakness persistent pain, Inability to tolerate liquids or food, changes in voice cough, altered mental status or any concerns. No signs of systemic illness or infection. The patient is nontoxic-appearing on exam and vital signs are within normal limits.    I have reviewed the triage vital signs and the nursing notes. Pertinent labs &imaging results that were available during my care of the patient were reviewed by me and considered in my medical decision making (see chart for details).  After history, exam, and medical workup I feel the patient has been appropriately medically screened and is safe for discharge home. Pertinent diagnoses were discussed with the patient. Patient was given return precautions     Dawnelle Warman, MD 01/25/17 [phone]

## 2017-01-25 NOTE — Discharge Instructions (Signed)
Your INR in the ED was 2.03

## 2017-01-26 NOTE — ED Notes (Signed)
Pt family has a list of labs she is requesting to be drawn, Dr. Randal Buba notified and currently at bedside speaking with family.

## 2017-01-29 DIAGNOSIS — M1711 Unilateral primary osteoarthritis, right knee: Secondary | ICD-10-CM | POA: Diagnosis not present

## 2017-01-30 DIAGNOSIS — I2510 Atherosclerotic heart disease of native coronary artery without angina pectoris: Secondary | ICD-10-CM | POA: Diagnosis not present

## 2017-01-30 DIAGNOSIS — M545 Low back pain: Secondary | ICD-10-CM | POA: Diagnosis not present

## 2017-01-30 DIAGNOSIS — J189 Pneumonia, unspecified organism: Secondary | ICD-10-CM | POA: Diagnosis not present

## 2017-01-30 DIAGNOSIS — Z7901 Long term (current) use of anticoagulants: Secondary | ICD-10-CM | POA: Diagnosis not present

## 2017-01-30 DIAGNOSIS — Z952 Presence of prosthetic heart valve: Secondary | ICD-10-CM | POA: Diagnosis not present

## 2017-01-30 DIAGNOSIS — K5790 Diverticulosis of intestine, part unspecified, without perforation or abscess without bleeding: Secondary | ICD-10-CM | POA: Diagnosis not present

## 2017-01-30 DIAGNOSIS — M810 Age-related osteoporosis without current pathological fracture: Secondary | ICD-10-CM | POA: Diagnosis not present

## 2017-01-30 DIAGNOSIS — Z86711 Personal history of pulmonary embolism: Secondary | ICD-10-CM | POA: Diagnosis not present

## 2017-01-30 DIAGNOSIS — E785 Hyperlipidemia, unspecified: Secondary | ICD-10-CM | POA: Diagnosis not present

## 2017-01-30 DIAGNOSIS — Z7982 Long term (current) use of aspirin: Secondary | ICD-10-CM | POA: Diagnosis not present

## 2017-01-30 DIAGNOSIS — Z8546 Personal history of malignant neoplasm of prostate: Secondary | ICD-10-CM | POA: Diagnosis not present

## 2017-01-30 DIAGNOSIS — Z8673 Personal history of transient ischemic attack (TIA), and cerebral infarction without residual deficits: Secondary | ICD-10-CM | POA: Diagnosis not present

## 2017-01-30 DIAGNOSIS — I255 Ischemic cardiomyopathy: Secondary | ICD-10-CM | POA: Diagnosis not present

## 2017-01-30 DIAGNOSIS — I4891 Unspecified atrial fibrillation: Secondary | ICD-10-CM | POA: Diagnosis not present

## 2017-01-30 DIAGNOSIS — K219 Gastro-esophageal reflux disease without esophagitis: Secondary | ICD-10-CM | POA: Diagnosis not present

## 2017-01-30 DIAGNOSIS — Z95810 Presence of automatic (implantable) cardiac defibrillator: Secondary | ICD-10-CM | POA: Diagnosis not present

## 2017-01-30 DIAGNOSIS — R339 Retention of urine, unspecified: Secondary | ICD-10-CM | POA: Diagnosis not present

## 2017-01-30 DIAGNOSIS — I5022 Chronic systolic (congestive) heart failure: Secondary | ICD-10-CM | POA: Diagnosis not present

## 2017-01-30 DIAGNOSIS — Z951 Presence of aortocoronary bypass graft: Secondary | ICD-10-CM | POA: Diagnosis not present

## 2017-01-30 DIAGNOSIS — E871 Hypo-osmolality and hyponatremia: Secondary | ICD-10-CM | POA: Diagnosis not present

## 2017-01-31 ENCOUNTER — Ambulatory Visit (INDEPENDENT_AMBULATORY_CARE_PROVIDER_SITE_OTHER): Payer: Medicare Other | Admitting: *Deleted

## 2017-01-31 DIAGNOSIS — I472 Ventricular tachycardia: Secondary | ICD-10-CM

## 2017-01-31 DIAGNOSIS — I4729 Other ventricular tachycardia: Secondary | ICD-10-CM

## 2017-01-31 DIAGNOSIS — Z952 Presence of prosthetic heart valve: Secondary | ICD-10-CM | POA: Diagnosis not present

## 2017-01-31 DIAGNOSIS — I482 Chronic atrial fibrillation, unspecified: Secondary | ICD-10-CM

## 2017-01-31 DIAGNOSIS — Z5181 Encounter for therapeutic drug level monitoring: Secondary | ICD-10-CM | POA: Diagnosis not present

## 2017-01-31 LAB — POCT INR: INR: 3.1

## 2017-01-31 MED ORDER — ENOXAPARIN SODIUM 80 MG/0.8ML ~~LOC~~ SOLN: 80.0000 mg | Freq: Two times a day (BID) | SUBCUTANEOUS | 0 refills | Status: DC

## 2017-01-31 NOTE — Patient Instructions (Signed)
Pt took coumadin 2.5mg  last night as instructed  Continue to take coumadin 5mg  daily Last day to take coumadin before procedure is Nov 23rd  See pt instruction sheet  Feb 02 2017 : Last dose of Coumadin.  Nov 24th: No Coumadin or Lovenox.  Nov 25th : Inject Lovenox 80 mg in the fatty abdominal tissue at least 2 inches from the belly button twice a day about 12 hours apart, 8am and 8pm rotate sites. No Coumadin.  Nov 26th: Inject Lovenox in the fatty tissue every 12 hours, 8am and 8pm. No Coumadin.  Nov 27th: Inject Lovenox in the fatty tissue every 12 hours, 8am and 8pm. No Coumadin.  Nov 28th : Inject Lovenox in the fatty tissue in the morning at 8 am (No PM dose). No Coumadin.  Nov 29th  Procedure  Day - No Lovenox - Resume Coumadin in the evening or as directed by doctor   Nov 30th: Resume Lovenox inject in the fatty tissue every 12 hours and take Coumadin.  Dec 1st: Inject Lovenox in the fatty tissue every 12 hours and take Coumadin.  Dec 2nd: Inject Lovenox in the fatty tissue every 12 hours and take Coumadin.  Dec 3rd: Inject Lovenox in the fatty tissue every 12 hours and take Coumadin.  Dec 4th: Coumadin appt to check INR.

## 2017-02-06 ENCOUNTER — Ambulatory Visit (INDEPENDENT_AMBULATORY_CARE_PROVIDER_SITE_OTHER): Payer: Medicare Other | Admitting: *Deleted

## 2017-02-06 DIAGNOSIS — Z95810 Presence of automatic (implantable) cardiac defibrillator: Secondary | ICD-10-CM

## 2017-02-06 DIAGNOSIS — I255 Ischemic cardiomyopathy: Secondary | ICD-10-CM | POA: Diagnosis not present

## 2017-02-06 DIAGNOSIS — I5022 Chronic systolic (congestive) heart failure: Secondary | ICD-10-CM

## 2017-02-07 ENCOUNTER — Ambulatory Visit (INDEPENDENT_AMBULATORY_CARE_PROVIDER_SITE_OTHER): Payer: Medicare Other

## 2017-02-07 DIAGNOSIS — I482 Chronic atrial fibrillation, unspecified: Secondary | ICD-10-CM

## 2017-02-07 DIAGNOSIS — Z5181 Encounter for therapeutic drug level monitoring: Secondary | ICD-10-CM

## 2017-02-07 LAB — POCT INR: INR: 1.4

## 2017-02-07 NOTE — Patient Instructions (Signed)
Pt holding Coumadin for spinal injection tomorrow, INR needs to be 1.3 or less, advised pt to eat a serving of greens today, call MD doing procedure and advise of INR today.  Continue holding Coumadin until after procedure tomorrow. Take Lovenox as instructed, last dosage this am, prior to procedure tomorrow.  Follow previous Lovenox bridging instructions, pt verbalized understanding.

## 2017-02-07 NOTE — Progress Notes (Signed)
Remote ICD transmission.   

## 2017-02-08 ENCOUNTER — Telehealth: Payer: Self-pay

## 2017-02-08 ENCOUNTER — Telehealth: Payer: Self-pay | Admitting: *Deleted

## 2017-02-08 DIAGNOSIS — M47816 Spondylosis without myelopathy or radiculopathy, lumbar region: Secondary | ICD-10-CM | POA: Diagnosis not present

## 2017-02-08 DIAGNOSIS — M810 Age-related osteoporosis without current pathological fracture: Secondary | ICD-10-CM | POA: Diagnosis not present

## 2017-02-08 LAB — CUP PACEART REMOTE DEVICE CHECK
Battery Voltage: 3.01 V
Brady Statistic RV Percent Paced: 1.98 %
Date Time Interrogation Session: 20181127083321
HIGH POWER IMPEDANCE MEASURED VALUE: 46 Ohm
HIGH POWER IMPEDANCE MEASURED VALUE: 57 Ohm
Lead Channel Impedance Value: 456 Ohm
Lead Channel Impedance Value: 551 Ohm
Lead Channel Sensing Intrinsic Amplitude: 18.25 mV
Lead Channel Sensing Intrinsic Amplitude: 18.25 mV
MDC IDC LEAD IMPLANT DT: 20021220
MDC IDC LEAD LOCATION: 753860
MDC IDC MSMT BATTERY REMAINING LONGEVITY: 102 mo
MDC IDC MSMT LEADCHNL RV PACING THRESHOLD AMPLITUDE: 1 V
MDC IDC MSMT LEADCHNL RV PACING THRESHOLD PULSEWIDTH: 0.4 ms
MDC IDC PG IMPLANT DT: 20141201
MDC IDC SET LEADCHNL RV PACING AMPLITUDE: 2 V
MDC IDC SET LEADCHNL RV PACING PULSEWIDTH: 0.4 ms
MDC IDC SET LEADCHNL RV SENSING SENSITIVITY: 0.3 mV

## 2017-02-08 LAB — PROTIME-INR: INR: 1.2 — AB (ref 0.9–1.1)

## 2017-02-08 NOTE — Telephone Encounter (Signed)
Remote ICM transmission received.  Attempted call to patient and left number with caregiver to return call

## 2017-02-08 NOTE — Telephone Encounter (Signed)
Sandy from [facility] monitoring called to report INR of 1.2 Instructed that pt is holding coumadin for procedure today and that his INR needed to be 1.3 or less so that is good INR for spinal injection and she states understanding

## 2017-02-08 NOTE — Progress Notes (Signed)
Returned patient call and transmission reviewed.  He denied any fluid symptoms and encouraged him to call should he have any symptoms.  No changes today and next ICM remote transmission 03/15/17

## 2017-02-08 NOTE — Progress Notes (Signed)
EPIC Encounter for ICM Monitoring  Patient Name: [name] is a 83 y.o. male Date: 02/08/2017 Primary Care Physican: Russo, Jru, MD Primary Cardiologist:Cooper Electrophysiologist: Klein Dry Weight:unknown            Attempted call to patient and unable to reach.  Left message to return call with person answering phone.  Transmission reviewed.    Thoracic impedance normal.  Prescribed dosage: Furosemide 20 mg 1 tablet as needed  Labs: 07/05/2016 Creatinine 0.94, BUN 14, Potassium 4.6, Sodium 127, EGFR >60 04/19/2016 Creatinine 0.9, BUN 13, Potassium 4.8, Sodium 131, EGFR 80.8 to 97.8 11/27/2017Creatinine 0.96, BUN 12, Potassium 4.4, Sodium 131 02/01/2016 Creatinine 1.00, BUN 9, Potassium 4.4, Sodium 131  10/25/2015 Creatinine 0.97, BUN 9, Potassium 4.1, Sodium 128  09/07/2015 Creatinine 0.99, BUN 11, Potassium 4.8, Sodium 131  09/02/2015 Creatinine 1.18, BUN 15, Potassium 4.5, Sodium 131  09/01/2015 Creatinine 1.10, BUN 13, Potassium 4.0, Sodium 139  07/19/2015 Creatinine 0.96, BUN 12, Potassium 4.6, Sodium 139  07/11/2015 Creatinine 0.91, BUN 11, Potassium 4.5, Sodium 127  03/28/2015 Creatinine 0.94, BUN 11, Potassium 4.5, Sodium 130  Recommendations: NONE - Unable to reach.  Follow-up plan: ICM clinic phone appointment on 03/15/2017.    Copy of ICM check sent to Dr. Klein.   3 month ICM trend: 02/06/2017    1 Year ICM trend:        S , RN 02/08/2017 2:41 PM    

## 2017-02-09 ENCOUNTER — Encounter: Payer: Self-pay | Admitting: Cardiology

## 2017-02-13 ENCOUNTER — Ambulatory Visit (INDEPENDENT_AMBULATORY_CARE_PROVIDER_SITE_OTHER): Payer: Medicare Other | Admitting: Pharmacist

## 2017-02-13 DIAGNOSIS — I482 Chronic atrial fibrillation, unspecified: Secondary | ICD-10-CM

## 2017-02-13 DIAGNOSIS — Z5181 Encounter for therapeutic drug level monitoring: Secondary | ICD-10-CM | POA: Diagnosis not present

## 2017-02-13 LAB — POCT INR: INR: 1.6

## 2017-02-14 DIAGNOSIS — E871 Hypo-osmolality and hyponatremia: Secondary | ICD-10-CM | POA: Diagnosis not present

## 2017-02-14 DIAGNOSIS — F418 Other specified anxiety disorders: Secondary | ICD-10-CM | POA: Diagnosis not present

## 2017-02-14 DIAGNOSIS — Z6822 Body mass index (BMI) 22.0-22.9, adult: Secondary | ICD-10-CM | POA: Diagnosis not present

## 2017-02-14 DIAGNOSIS — R339 Retention of urine, unspecified: Secondary | ICD-10-CM | POA: Diagnosis not present

## 2017-02-14 DIAGNOSIS — M545 Low back pain: Secondary | ICD-10-CM | POA: Diagnosis not present

## 2017-02-14 DIAGNOSIS — R634 Abnormal weight loss: Secondary | ICD-10-CM | POA: Diagnosis not present

## 2017-02-14 DIAGNOSIS — I482 Chronic atrial fibrillation: Secondary | ICD-10-CM | POA: Diagnosis not present

## 2017-02-14 DIAGNOSIS — R5383 Other fatigue: Secondary | ICD-10-CM | POA: Diagnosis not present

## 2017-02-14 DIAGNOSIS — M1990 Unspecified osteoarthritis, unspecified site: Secondary | ICD-10-CM | POA: Diagnosis not present

## 2017-02-16 ENCOUNTER — Ambulatory Visit (INDEPENDENT_AMBULATORY_CARE_PROVIDER_SITE_OTHER): Payer: Medicare Other | Admitting: Cardiovascular Disease

## 2017-02-16 DIAGNOSIS — I482 Chronic atrial fibrillation, unspecified: Secondary | ICD-10-CM

## 2017-02-16 DIAGNOSIS — Z5181 Encounter for therapeutic drug level monitoring: Secondary | ICD-10-CM | POA: Diagnosis not present

## 2017-02-16 LAB — POCT INR: INR: 2.4

## 2017-02-20 DIAGNOSIS — M545 Low back pain: Secondary | ICD-10-CM | POA: Diagnosis not present

## 2017-02-20 DIAGNOSIS — Z952 Presence of prosthetic heart valve: Secondary | ICD-10-CM | POA: Diagnosis not present

## 2017-02-20 DIAGNOSIS — I5022 Chronic systolic (congestive) heart failure: Secondary | ICD-10-CM | POA: Diagnosis not present

## 2017-02-20 DIAGNOSIS — I480 Paroxysmal atrial fibrillation: Secondary | ICD-10-CM | POA: Diagnosis not present

## 2017-02-20 DIAGNOSIS — K5909 Other constipation: Secondary | ICD-10-CM | POA: Diagnosis not present

## 2017-02-20 DIAGNOSIS — E871 Hypo-osmolality and hyponatremia: Secondary | ICD-10-CM | POA: Diagnosis not present

## 2017-02-20 DIAGNOSIS — R339 Retention of urine, unspecified: Secondary | ICD-10-CM | POA: Diagnosis not present

## 2017-02-20 DIAGNOSIS — Z6822 Body mass index (BMI) 22.0-22.9, adult: Secondary | ICD-10-CM | POA: Diagnosis not present

## 2017-02-20 DIAGNOSIS — Z7901 Long term (current) use of anticoagulants: Secondary | ICD-10-CM | POA: Diagnosis not present

## 2017-02-20 DIAGNOSIS — E038 Other specified hypothyroidism: Secondary | ICD-10-CM | POA: Diagnosis not present

## 2017-02-20 DIAGNOSIS — N390 Urinary tract infection, site not specified: Secondary | ICD-10-CM | POA: Diagnosis not present

## 2017-02-23 ENCOUNTER — Ambulatory Visit (INDEPENDENT_AMBULATORY_CARE_PROVIDER_SITE_OTHER): Payer: Medicare Other | Admitting: Cardiology

## 2017-02-23 DIAGNOSIS — Z952 Presence of prosthetic heart valve: Secondary | ICD-10-CM | POA: Diagnosis not present

## 2017-02-23 DIAGNOSIS — Z8546 Personal history of malignant neoplasm of prostate: Secondary | ICD-10-CM | POA: Diagnosis not present

## 2017-02-23 DIAGNOSIS — I482 Chronic atrial fibrillation, unspecified: Secondary | ICD-10-CM

## 2017-02-23 DIAGNOSIS — Z5181 Encounter for therapeutic drug level monitoring: Secondary | ICD-10-CM | POA: Diagnosis not present

## 2017-02-23 DIAGNOSIS — R339 Retention of urine, unspecified: Secondary | ICD-10-CM | POA: Diagnosis not present

## 2017-02-23 DIAGNOSIS — R310 Gross hematuria: Secondary | ICD-10-CM | POA: Diagnosis not present

## 2017-02-23 DIAGNOSIS — N320 Bladder-neck obstruction: Secondary | ICD-10-CM | POA: Diagnosis not present

## 2017-02-23 LAB — POCT INR: INR: 3.8

## 2017-02-25 ENCOUNTER — Other Ambulatory Visit: Payer: Self-pay | Admitting: Cardiovascular Disease

## 2017-02-26 DIAGNOSIS — I472 Ventricular tachycardia: Secondary | ICD-10-CM | POA: Diagnosis not present

## 2017-02-26 DIAGNOSIS — Z86711 Personal history of pulmonary embolism: Secondary | ICD-10-CM | POA: Diagnosis not present

## 2017-02-26 DIAGNOSIS — Z8546 Personal history of malignant neoplasm of prostate: Secondary | ICD-10-CM | POA: Diagnosis not present

## 2017-02-26 DIAGNOSIS — N320 Bladder-neck obstruction: Secondary | ICD-10-CM | POA: Diagnosis not present

## 2017-02-26 DIAGNOSIS — R339 Retention of urine, unspecified: Secondary | ICD-10-CM | POA: Diagnosis not present

## 2017-02-26 DIAGNOSIS — I4891 Unspecified atrial fibrillation: Secondary | ICD-10-CM | POA: Diagnosis not present

## 2017-02-27 ENCOUNTER — Other Ambulatory Visit: Payer: Self-pay | Admitting: Physical Medicine and Rehabilitation

## 2017-02-27 ENCOUNTER — Telehealth: Payer: Self-pay | Admitting: Cardiovascular Disease

## 2017-02-27 DIAGNOSIS — M545 Low back pain: Secondary | ICD-10-CM

## 2017-02-27 DIAGNOSIS — M25551 Pain in right hip: Secondary | ICD-10-CM | POA: Diagnosis not present

## 2017-02-27 DIAGNOSIS — M810 Age-related osteoporosis without current pathological fracture: Secondary | ICD-10-CM | POA: Diagnosis not present

## 2017-02-27 DIAGNOSIS — M47816 Spondylosis without myelopathy or radiculopathy, lumbar region: Secondary | ICD-10-CM | POA: Diagnosis not present

## 2017-02-27 NOTE — Telephone Encounter (Signed)
°  New message ° °Pt verbalized that she is returning call for the rn  °

## 2017-02-27 NOTE — Telephone Encounter (Signed)
Ok to use but would recommend short duration if possible since he is also taking aspirin and warfarin.

## 2017-02-27 NOTE — Telephone Encounter (Addendum)
With patient's permission, spoke with [name] (his caregiver).  She states the patient fell in September and has "gone downhill since." He is sore all the time and has a permanent foley catheter.  They originally called to see if he could use an anti-inflammatory patch. Informed [name] that a patch could be worn, but it should be short duration because of increased bleeding risk with ASA and warfarin.  She requests an appointment with [name]. She reports "a lot has happened since [name] saw [name] last and he has some procedures coming up." Scheduled patient for follow-up tomorrow morning with [name]. They were grateful for call and agree with treatment plan.

## 2017-02-27 NOTE — Telephone Encounter (Signed)
[name] is wanting to know if he can take a anti-inflammatory  patch with his coumadin and  other heart medication . Please call

## 2017-02-27 NOTE — Telephone Encounter (Signed)
Left message to call back  

## 2017-02-28 ENCOUNTER — Ambulatory Visit (INDEPENDENT_AMBULATORY_CARE_PROVIDER_SITE_OTHER): Payer: Medicare Other | Admitting: Cardiovascular Disease

## 2017-02-28 ENCOUNTER — Encounter: Payer: Self-pay | Admitting: Cardiovascular Disease

## 2017-02-28 VITALS — BP 122/64 | HR 68 | Ht 74.0 in | Wt 173.4 lb

## 2017-02-28 DIAGNOSIS — I5022 Chronic systolic (congestive) heart failure: Secondary | ICD-10-CM | POA: Diagnosis not present

## 2017-02-28 DIAGNOSIS — I255 Ischemic cardiomyopathy: Secondary | ICD-10-CM

## 2017-02-28 DIAGNOSIS — I359 Nonrheumatic aortic valve disorder, unspecified: Secondary | ICD-10-CM | POA: Diagnosis not present

## 2017-02-28 DIAGNOSIS — I482 Chronic atrial fibrillation, unspecified: Secondary | ICD-10-CM

## 2017-02-28 NOTE — Patient Instructions (Addendum)
Medication Instructions:  Your provider recommends that you continue on your current medications as directed. Please refer to the Current Medication list given to you today.    Labwork: None  Testing/Procedures: None  Follow-Up: Your provider wants you to follow-up in: 4 months with Dr. Burt Knack. You will receive a reminder letter in the mail two months in advance. If you don't receive a letter, please call our office to schedule the follow-up appointment.    Any Other Special Instructions Will Be Listed Below (If Applicable).     If you need a refill on your cardiac medications before your next appointment, please call your pharmacy.

## 2017-02-28 NOTE — Progress Notes (Signed)
Cardiology Office Note Date:  03/02/2017   ID:  [name], DOB Oct 13, 1932, MRN [id number]  PCP:  [name], MD  Cardiologist:  Sherren Mocha, MD    Chief Complaint  Patient presents with  . Follow-up    weakness     History of Present Illness: [name] is a 83 y.o. male who presents for follow-up evaluation.   He has coronary artery disease with initial CABG surgery in 1979 and redo CABG in 1991. He's had a long-standing severe ischemic cardiomyopathy with LVEF less than 30%. The patient has undergone ICD implantation. He underwent TAVR the [facility] clinic in 2012 and he has mild-moderate paravalvular aortic insufficiency which has been followed with serial echo studies. He's developed permanent atrial fibrillation and is maintained on long-term anticoagulation. He's not been able to tolerate medications for heart failure because of low BP and frailty.   He is here with his wife and caregivers today. Planning on undergoing urodynamic studies soon - wife is concerned this may be too much for him. He has a chronic foley catheter in place at present. Continues to have back problems and leg weakness/gait instability. No CP, dyspnea, edema, or palpitations.   Past Medical History:  Diagnosis Date  . AF (atrial fibrillation) ([place])    Has not tolerated amiodarone in the past. Amiodarone was stopped in September of 2010 due to side effects  . Aortic stenosis, severe    WITH PERCUTANEOUS AORTIC VALVE (TAVI) IN March 2012 at the [facility]  . Cervical myelopathy ([place])   . CHF (congestive heart failure) ([place])   . Chronic anticoagulation    on coumadin  . Coronary artery disease   . Diverticulitis    CURRENTLY CONTROLLED WITH NO EVIDENCE OF RECURRENT INFECTION  . Esophageal stricture    WITH DILATATION  . GERD (gastroesophageal reflux disease)   . Heart murmur   . Hyperlipidemia   . Ischemic cardiomyopathy 05/2010   Has EF of 25%  . MI (myocardial infarction) ([place])  1974, 1978  . NSVT (nonsustained ventricular tachycardia) ([place])   . Osteoarthritis    RIGHT KNEE  . Osteoporosis   . Prostate cancer ([place])   . Pulmonary embolism ([place])   . Stroke [facility])    3 strokes    Past Surgical History:  Procedure Laterality Date  . AORTIC VALVE REPLACEMENT     Percutaneous AVR in March 2012 at the [facility]  . BACK SURGERY    . CARDIAC CATHETERIZATION  2010   SEVERE LV DYSFUNCTION WITH ESTIMATED EJECTION FRACTION OF 25%  . CARDIOVERSION  02/16/2011   Procedure: CARDIOVERSION;  Surgeon: Carlena Bjornstad, MD;  Location: [facility];  Service: Cardiovascular;  Laterality: N/A;  . CHOLECYSTECTOMY    . CORONARY ARTERY BYPASS GRAFT  1979  . CORONARY ARTERY BYPASS GRAFT  1991   REDO SURGERY  . ICD  Feb 2003; 02/2013   gen change 02-11-2013 by Dr Caryl Comes  . IMPLANTABLE CARDIOVERTER DEFIBRILLATOR (ICD) GENERATOR CHANGE N/A 02/10/2013   Procedure: ICD GENERATOR CHANGE;  Surgeon: Deboraha Sprang, MD;  Location: [facility] CATH LAB;  Service: Cardiovascular;  Laterality: N/A;  . SHOULDER SURGERY      Current Outpatient Medications  Medication Sig Dispense Refill  . acetaminophen (TYLENOL) 500 MG tablet Take 1,000 mg by mouth every 6 (six) hours as needed for mild pain.     Marland Kitchen albuterol (PROVENTIL HFA;VENTOLIN HFA) 108 (90 Base) MCG/ACT inhaler Inhale 1-2 puffs into the lungs every 6 (six)  hours as needed for wheezing or shortness of breath.    . alfuzosin (UROXATRAL) 10 MG 24 hr tablet Take 10 mg by mouth at bedtime.     Marland Kitchen aspirin 81 MG tablet Take 81 mg by mouth daily.    Marland Kitchen atorvastatin (LIPITOR) 10 MG tablet Take 10 mg by mouth every other day.    . benzonatate (TESSALON) 100 MG capsule Take 100 mg by mouth 3 (three) times daily as needed for cough.    . Budesonide-Formoterol Fumarate (SYMBICORT IN) Inhale 1 puff into the lungs 2 (two) times daily.    . carvedilol (COREG) 6.25 MG tablet TAKE 1 TABLET TWICE DAILY WITH A MEAL. 180 tablet 2  . cholecalciferol (VITAMIN D) 1000 UNITS  tablet Take 1,000 Units by mouth 2 (two) times daily.    Marland Kitchen COUMADIN 5 MG tablet TAKE AS DIRECTED BY COUMADIN CLINIC. 100 tablet 1  . dicyclomine (BENTYL) 20 MG tablet Take 1 tablet (20 mg total) by mouth 2 (two) times daily as needed for spasms. 20 tablet 0  . enoxaparin (LOVENOX) 80 MG/0.8ML injection Inject 0.8 mLs (80 mg total) into the skin every 12 (twelve) hours. Pt to start Lovenox on 02/04/2017 20 Syringe 0  . furosemide (LASIX) 20 MG tablet TAKE (1) TABLET DAILY AS NEEDED FOR SWELLING. 30 tablet 0  . ipratropium (ATROVENT) 0.03 % nasal spray Place 2 sprays into both nostrils 2 (two) times daily.    Marland Kitchen lactulose (CEPHULAC) 10 g packet Take 1 packet (10 g total) daily as needed by mouth (for severe constipation). 15 each 0  . levofloxacin (LEVAQUIN) 250 MG tablet Take 1 tablet (250 mg total) at bedtime by mouth. 7 tablet 0  . levothyroxine (SYNTHROID) 125 MCG tablet Take 1 tablet (125 mcg total) by mouth daily before breakfast. 30 tablet 0  . linaclotide (LINZESS) 145 MCG CAPS capsule Take 145 mcg by mouth daily before breakfast.    . nitroGLYCERIN (NITROSTAT) 0.4 MG SL tablet Place 1 tablet (0.4 mg total) under the tongue every 5 (five) minutes x 3 doses as needed for chest pain (Max 3 doses in 15 [name]. Call 911). 25 tablet 1  . omeprazole (PRILOSEC) 20 MG capsule Take 20 mg by mouth daily as needed (acid reflux).    . polyethylene glycol (MIRALAX / GLYCOLAX) packet Take 17 g by mouth daily as needed for mild constipation.     . Probiotic Product (PROBIOTIC DAILY PO) Take 1 capsule by mouth daily.    . RESTASIS 0.05 % ophthalmic emulsion Place 1 drop into both eyes 2 (two) times daily as needed (dry eyes).     . traMADol (ULTRAM) 50 MG tablet Take 50 mg by mouth every 6 (six) hours as needed for moderate pain.     No current facility-administered medications for this visit.     Allergies:   Penicillins; Tizanidine; Ace inhibitors; Antihistamines, diphenhydramine-type; Clemastine fumarate;  Dimenhydrinate; and Amiodarone   Social History:  The patient  reports that he quit smoking about 47 years ago. His smoking use included cigarettes. He has a 60.00 pack-year smoking history. he has never used smokeless tobacco. He reports that he does not drink alcohol or use drugs.   Family History:  The patient's  family history includes Diabetes (age of onset: 53) in his mother; Heart disease (age of onset: 82) in his mother; Heart disease (age of onset: 32) in his father.    ROS:  Please see the history of present illness.  Otherwise, review of  systems is positive for weight loss, blood in urine, back pain, easy bruising, fatigue, constipation.  All other systems are reviewed and negative.    PHYSICAL EXAM: VS:  BP 122/64   Pulse 68   Ht 6\' 2"  (1.88 m)   Wt 173 lb 6.4 oz (78.7 kg)   BMI 22.26 kg/m  , BMI Body mass index is 22.26 kg/m. GEN: elderly male, in no acute distress  HEENT: normal  Neck: no JVD, no masses. No carotid bruits Cardiac: RRR with 2/6 diastolic decrescendo murmur at the RUSB              Respiratory:  clear to auscultation bilaterally, normal work of breathing GI: soft, nontender, nondistended, + BS MS: no deformity or atrophy  Ext: no pretibial edema, pedal pulses 2+= bilaterally Skin: warm and dry, no rash Neuro:  Strength and sensation are intact Psych: euthymic mood, full affect  EKG:  EKG is ordered today. The ekg ordered today shows atrial fibrillation with occasional PVC, nonspecific ST abnormality  Recent Labs: 07/05/2016: ALT 12 01/21/2017: TSH 2.815 01/22/2017: Platelets 160 01/25/2017: BUN 9; Creatinine, Ser 0.90; Hemoglobin 13.3; Potassium 4.3; Sodium 125   Lipid Panel     Component Value Date/Time   CHOL 142 02/01/2016 1111   TRIG 72 02/01/2016 1111   HDL 51 02/01/2016 1111   CHOLHDL 2.8 02/01/2016 1111   VLDL 14 02/01/2016 1111   LDLCALC 77 02/01/2016 1111      Wt Readings from Last 3 Encounters:  02/28/17 173 lb 6.4 oz (78.7 kg)   01/21/17 190 lb (86.2 kg)  11/29/16 184 lb (83.5 kg)     Cardiac Studies Reviewed: 2D echo 11-20-2016: Study Conclusions  - Left ventricle: The cavity size was normal. There was mild focal   basal hypertrophy of the septum. Systolic function was severely   reduced. The estimated ejection fraction was in the range of 20%   to 25%. Diffuse hypokinesis. Akinesis of the   mid-apicalinferoseptal and apical myocardium. The study is not   technically sufficient to allow evaluation of LV diastolic   function. - Aortic valve: A TAVR bioprosthesis was present. Transvalvular   velocity was within the normal range. There was no stenosis.   There was mild regurgitation. - Aorta: Ascending aortic diameter: 37 mm (S). - Ascending aorta: The ascending aorta was mildly dilated. - Mitral valve: Transvalvular velocity was within the normal range.   There was no evidence for stenosis. There was trivial   regurgitation. - Left atrium: The atrium was severely dilated. - Right ventricle: The cavity size was normal. Wall thickness was   normal. Systolic function was normal. - Tricuspid valve: There was mild regurgitation. - Pulmonary arteries: Systolic pressure could not be accurately   estimated.  ASSESSMENT AND PLAN: 1.  Chronic systolic heart failure, NYHA 2 symptoms: stable, euvolemic. Medications reviewed and will be continued. Primarily limited by noncardiac issues.  2. Aortic valve disease s/p TAVR: mild-moderate PVL stable.   3. Permanent AF: tolerating warfarin anticoagulation without bleeding problems.   4. CAD, hx redo CABG: no angina.  Current medicines are reviewed with the patient today.  The patient does not have concerns regarding medicines.  Labs/ tests ordered today include:   Orders Placed This Encounter  Procedures  . EKG 12-Lead    Disposition:   FU 4 months  Signed, Sherren Mocha, MD  03/02/2017 11:05 PM    [address],  [place], [place]  [address]  Phone: [phone]; Fax: [phone]

## 2017-03-02 ENCOUNTER — Encounter: Payer: Self-pay | Admitting: Cardiovascular Disease

## 2017-03-02 ENCOUNTER — Ambulatory Visit
Admission: RE | Admit: 2017-03-02 | Discharge: 2017-03-02 | Disposition: A | Discharge status: Home / Self Care | Payer: Medicare Other | Source: Ambulatory Visit | Attending: Physical Medicine and Rehabilitation | Admitting: Physical Medicine and Rehabilitation

## 2017-03-02 DIAGNOSIS — M545 Low back pain: Secondary | ICD-10-CM | POA: Diagnosis not present

## 2017-03-02 LAB — POCT INR: INR: 3.6

## 2017-03-07 ENCOUNTER — Ambulatory Visit (INDEPENDENT_AMBULATORY_CARE_PROVIDER_SITE_OTHER): Payer: Medicare Other

## 2017-03-07 DIAGNOSIS — S32001A Stable burst fracture of unspecified lumbar vertebra, initial encounter for closed fracture: Secondary | ICD-10-CM | POA: Diagnosis not present

## 2017-03-07 DIAGNOSIS — I482 Chronic atrial fibrillation, unspecified: Secondary | ICD-10-CM

## 2017-03-07 DIAGNOSIS — Z5181 Encounter for therapeutic drug level monitoring: Secondary | ICD-10-CM | POA: Diagnosis not present

## 2017-03-07 NOTE — Patient Instructions (Signed)
Called spoke with pt on 03/07/17, pt states he checked his INR on 03/02/17 INR 3.6 he held his Coumadin that day and resumed 5mg  daily.  Advised pt to start taking 5mg  daily except 2.5mg  on Wednesdays.  Pt will recheck INR in 1 week on Friday.

## 2017-03-09 ENCOUNTER — Ambulatory Visit (INDEPENDENT_AMBULATORY_CARE_PROVIDER_SITE_OTHER): Payer: Medicare Other | Admitting: Cardiovascular Disease

## 2017-03-09 DIAGNOSIS — I482 Chronic atrial fibrillation, unspecified: Secondary | ICD-10-CM

## 2017-03-09 DIAGNOSIS — Z5181 Encounter for therapeutic drug level monitoring: Secondary | ICD-10-CM | POA: Diagnosis not present

## 2017-03-09 DIAGNOSIS — Z7901 Long term (current) use of anticoagulants: Secondary | ICD-10-CM | POA: Diagnosis not present

## 2017-03-09 LAB — POCT INR: INR: 2.4

## 2017-03-12 ENCOUNTER — Telehealth: Payer: Self-pay | Admitting: *Deleted

## 2017-03-12 DIAGNOSIS — Z6821 Body mass index (BMI) 21.0-21.9, adult: Secondary | ICD-10-CM | POA: Diagnosis not present

## 2017-03-12 DIAGNOSIS — R1310 Dysphagia, unspecified: Secondary | ICD-10-CM | POA: Diagnosis not present

## 2017-03-12 DIAGNOSIS — J40 Bronchitis, not specified as acute or chronic: Secondary | ICD-10-CM | POA: Diagnosis not present

## 2017-03-12 DIAGNOSIS — R05 Cough: Secondary | ICD-10-CM | POA: Diagnosis not present

## 2017-03-12 NOTE — Telephone Encounter (Signed)
   Chart reviewed as part of pre-operative protocol coverage. Pt on Coumadin for permanent atrial fib. Anticoagulation question (epidural steroid injection) -> will forward to pharmD for input.  Charlie Pitter, PA-C 03/12/2017, 1:22 PM

## 2017-03-12 NOTE — Telephone Encounter (Signed)
Spoke with patient and caregiver Verdis Frederickson) at the request of the patient to advise to call when procedure scheduled. He and caregiver state understanding.

## 2017-03-12 NOTE — Telephone Encounter (Signed)
Patient with diagnosis of Afib on warfarin for anticoagulation.    Procedure: spinal injection Date of procedure: TBD  CHADS2-VASc score of  6 (CHF, HTN, AGE, DM2, stroke/tia x 2 (hx PE and 3 prior strokes), CAD, AGE, male)  CrCl 60ml/min  Per office protocol, patient can hold warfarin for 5 days prior to procedure with a lovenox bridge  For orthopedic procedures please be sure to resume therapeutic (not prophylactic) dosing.

## 2017-03-12 NOTE — Telephone Encounter (Signed)
   [facility] Pre-operative Risk Assessment    Request for surgical clearance:  1. What type of surgery is being performed? LUMBAR EPIDURAL, STEROID INJECTION, BILATERAL L4-5   2. When is this surgery scheduled? TBD    3. Are there any medications that need to be held prior to surgery and how long?COUMADIN   4. Practice name and name of physician performing surgery? [facility]   5. What is your office phone and fax number? PH# [phone], FAX # [phone]   7. Anesthesia type (None, local, MAC, general) ? NONE LISTED   Julaine Hua 03/12/2017, 9:17 AM  _________________________________________________________________   (provider comments below)

## 2017-03-14 ENCOUNTER — Telehealth: Payer: Self-pay | Admitting: Pharmacist

## 2017-03-14 NOTE — Telephone Encounter (Signed)
   Primary Cardiologist: Sherren Mocha, MD  Chart reviewed as part of pre-operative protocol coverage. Given past medical history and time since last visit, based on ACC/AHA guidelines, [name] would be at acceptable risk for the planned procedure (lumbar epidural steroid injection) without further cardiovascular testing.   He is on warfarin for history of Afib complicated by prior stroke and CHA2DS2VASc = 6.  His case has been reviewed by our pharmacy team and given increased stroke risk, he will require lovenox bridging prior to his procedure.  Pt has been advised to contact our coumadin clinic once the procedure has been scheduled in order to clarify timing of bridging.  Post-procedure, pt will again require lovenox bridging at a therapeutic (not prophylactic) dose.  Please call with questions.  Murray Hodgkins, NP 03/14/2017, 4:37 PM

## 2017-03-14 NOTE — Telephone Encounter (Signed)
Pt called clinic stating he started taking azithromycin and prednisone 6 day taper (started with 30mg ) yesterday. Advised pt to check INR on Friday instead of next Monday as he was previously scheduled.

## 2017-03-15 ENCOUNTER — Ambulatory Visit (INDEPENDENT_AMBULATORY_CARE_PROVIDER_SITE_OTHER): Payer: Medicare Other

## 2017-03-15 DIAGNOSIS — Z95810 Presence of automatic (implantable) cardiac defibrillator: Secondary | ICD-10-CM | POA: Diagnosis not present

## 2017-03-15 DIAGNOSIS — I5022 Chronic systolic (congestive) heart failure: Secondary | ICD-10-CM

## 2017-03-15 DIAGNOSIS — R339 Retention of urine, unspecified: Secondary | ICD-10-CM | POA: Diagnosis not present

## 2017-03-15 NOTE — Progress Notes (Signed)
EPIC Encounter for ICM Monitoring  Patient Name: [name] is a 84 y.o. male Date: 03/15/2017 Primary Care Physican: [name], MD Primary Cardiologist:Cooper Electrophysiologist: Caryl Comes Dry Weight:164 lbs      Heart Failure questions reviewed, pt asymptomatic.   Thoracic impedance normal.  Prescribed dosage: Furosemide 20 mg 1 tablet as needed  Labs: 07/05/2016 Creatinine 0.94, BUN 14, Potassium 4.6, Sodium 127, EGFR >60 04/19/2016 Creatinine 0.9, BUN 13, Potassium 4.8, Sodium 131, EGFR 80.8 to 97.8 11/27/2017Creatinine 0.96, BUN 12, Potassium 4.4, Sodium 131 02/01/2016 Creatinine 1.00, BUN 9, Potassium 4.4, Sodium 131  10/25/2015 Creatinine 0.97, BUN 9, Potassium 4.1, Sodium 128  09/07/2015 Creatinine 0.99, BUN 11, Potassium 4.8, Sodium 131  09/02/2015 Creatinine 1.18, BUN 15, Potassium 4.5, Sodium 131  09/01/2015 Creatinine 1.10, BUN 13, Potassium 4.0, Sodium 139  07/19/2015 Creatinine 0.96, BUN 12, Potassium 4.6, Sodium 139  07/11/2015 Creatinine 0.91, BUN 11, Potassium 4.5, Sodium 127  03/28/2015 Creatinine 0.94, BUN 11, Potassium 4.5, Sodium 130  Recommendations: No changes.  Encouraged to call for fluid symptoms.  Follow-up plan: ICM clinic phone appointment on 04/16/2017.    Copy of ICM check sent to Dr. Caryl Comes.   3 month ICM trend: 03/15/2017    1 Year ICM trend:       Rosalene Billings, RN 03/15/2017 3:18 PM

## 2017-03-16 ENCOUNTER — Ambulatory Visit (INDEPENDENT_AMBULATORY_CARE_PROVIDER_SITE_OTHER): Payer: Medicare Other | Admitting: Pharmacist

## 2017-03-16 DIAGNOSIS — I482 Chronic atrial fibrillation, unspecified: Secondary | ICD-10-CM

## 2017-03-16 DIAGNOSIS — Z5181 Encounter for therapeutic drug level monitoring: Secondary | ICD-10-CM

## 2017-03-16 LAB — POCT INR: INR: 5.5

## 2017-03-16 NOTE — Patient Instructions (Signed)
Description   Skip Coumadin for 3 days (prednisone taper), then continue taking 5mg  daily except 2.5mg  on Wednesdays. Recheck in 1 week.

## 2017-03-19 ENCOUNTER — Telehealth: Payer: Self-pay | Admitting: Cardiovascular Disease

## 2017-03-19 ENCOUNTER — Ambulatory Visit (INDEPENDENT_AMBULATORY_CARE_PROVIDER_SITE_OTHER): Payer: Medicare Other | Admitting: Cardiology

## 2017-03-19 DIAGNOSIS — I482 Chronic atrial fibrillation, unspecified: Secondary | ICD-10-CM

## 2017-03-19 DIAGNOSIS — Z5181 Encounter for therapeutic drug level monitoring: Secondary | ICD-10-CM

## 2017-03-19 LAB — POCT INR: INR: 3.3

## 2017-03-19 NOTE — Telephone Encounter (Signed)
Refaxed clearance to [phone].

## 2017-03-19 NOTE — Telephone Encounter (Signed)
New message     [facility]  calling for status of preop clearance letter. Please fax letter.  PH# [phone], FAX # [phone]

## 2017-03-19 NOTE — Patient Instructions (Signed)
Description   Today take 2.5mg , then  then continue taking 5mg  daily except 2.5mg  on Wednesdays. Recheck in 1 week.

## 2017-03-20 DIAGNOSIS — M79675 Pain in left toe(s): Secondary | ICD-10-CM | POA: Diagnosis not present

## 2017-03-20 DIAGNOSIS — B351 Tinea unguium: Secondary | ICD-10-CM | POA: Diagnosis not present

## 2017-03-20 DIAGNOSIS — I739 Peripheral vascular disease, unspecified: Secondary | ICD-10-CM | POA: Diagnosis not present

## 2017-03-20 DIAGNOSIS — M79674 Pain in right toe(s): Secondary | ICD-10-CM | POA: Diagnosis not present

## 2017-03-21 ENCOUNTER — Telehealth: Payer: Self-pay | Admitting: *Deleted

## 2017-03-21 NOTE — Telephone Encounter (Signed)
Spoke with Darcey at Dr Raliegh Ip and she states she has clearance for the pt He is scheduled for Spinal injection on January 24th per Dr Ron Agee  and is to hold coumadin  5 days prior to procedure with lovenox bridge . He is self tester and has done Lovenox in the past He is to check INR on the day of the procedure as well . Procedure is scheduled for 10:45am  Pt is to check INR on Jan 14th and at that time will instruct regarding holding coumadin and the Lovenox bridge  Spoke with pt's caregiver Judge Stall cell phone [phone] and instructed that he needs to do scheduled INR check on Jan 14th and call to coumadin clinic and at that time we will instruct regarding holding coumadin and the Lovenox bridge instructions and she states understanding

## 2017-03-22 DIAGNOSIS — R1319 Other dysphagia: Secondary | ICD-10-CM | POA: Diagnosis not present

## 2017-03-22 DIAGNOSIS — Z952 Presence of prosthetic heart valve: Secondary | ICD-10-CM | POA: Diagnosis not present

## 2017-03-22 DIAGNOSIS — I5022 Chronic systolic (congestive) heart failure: Secondary | ICD-10-CM | POA: Diagnosis not present

## 2017-03-22 DIAGNOSIS — Z7901 Long term (current) use of anticoagulants: Secondary | ICD-10-CM | POA: Diagnosis not present

## 2017-03-22 DIAGNOSIS — E46 Unspecified protein-calorie malnutrition: Secondary | ICD-10-CM | POA: Diagnosis not present

## 2017-03-22 DIAGNOSIS — Z6822 Body mass index (BMI) 22.0-22.9, adult: Secondary | ICD-10-CM | POA: Diagnosis not present

## 2017-03-22 DIAGNOSIS — R338 Other retention of urine: Secondary | ICD-10-CM | POA: Diagnosis not present

## 2017-03-22 DIAGNOSIS — I482 Chronic atrial fibrillation: Secondary | ICD-10-CM | POA: Diagnosis not present

## 2017-03-22 DIAGNOSIS — J40 Bronchitis, not specified as acute or chronic: Secondary | ICD-10-CM | POA: Diagnosis not present

## 2017-03-22 DIAGNOSIS — I2721 Secondary pulmonary arterial hypertension: Secondary | ICD-10-CM | POA: Diagnosis not present

## 2017-03-22 DIAGNOSIS — Z960 Presence of urogenital implants: Secondary | ICD-10-CM | POA: Diagnosis not present

## 2017-03-23 DIAGNOSIS — Z8546 Personal history of malignant neoplasm of prostate: Secondary | ICD-10-CM | POA: Diagnosis not present

## 2017-03-23 DIAGNOSIS — R339 Retention of urine, unspecified: Secondary | ICD-10-CM | POA: Diagnosis not present

## 2017-03-23 DIAGNOSIS — I5022 Chronic systolic (congestive) heart failure: Secondary | ICD-10-CM | POA: Diagnosis not present

## 2017-03-23 DIAGNOSIS — I482 Chronic atrial fibrillation: Secondary | ICD-10-CM | POA: Diagnosis not present

## 2017-03-23 DIAGNOSIS — N320 Bladder-neck obstruction: Secondary | ICD-10-CM | POA: Diagnosis not present

## 2017-03-23 DIAGNOSIS — I472 Ventricular tachycardia: Secondary | ICD-10-CM | POA: Diagnosis not present

## 2017-03-23 DIAGNOSIS — Z86711 Personal history of pulmonary embolism: Secondary | ICD-10-CM | POA: Diagnosis not present

## 2017-03-23 DIAGNOSIS — R350 Frequency of micturition: Secondary | ICD-10-CM | POA: Diagnosis not present

## 2017-03-26 ENCOUNTER — Ambulatory Visit (INDEPENDENT_AMBULATORY_CARE_PROVIDER_SITE_OTHER): Payer: Medicare Other | Admitting: Cardiovascular Disease

## 2017-03-26 DIAGNOSIS — I482 Chronic atrial fibrillation, unspecified: Secondary | ICD-10-CM

## 2017-03-26 DIAGNOSIS — Z5181 Encounter for therapeutic drug level monitoring: Secondary | ICD-10-CM

## 2017-03-26 DIAGNOSIS — R339 Retention of urine, unspecified: Secondary | ICD-10-CM | POA: Diagnosis not present

## 2017-03-26 LAB — POCT INR: INR: 2.1

## 2017-03-26 MED ORDER — ENOXAPARIN SODIUM 80 MG/0.8ML ~~LOC~~ SOLN: 80.0000 mg | Freq: Two times a day (BID) | SUBCUTANEOUS | 1 refills | Status: DC

## 2017-03-26 NOTE — Patient Instructions (Addendum)
Today take 7.5mg , then continue taking 5mg  daily except 2.5mg  on Wednesdays. Recheck in day of procedure then 1 week after procedure.   Lovenox/Coumadin Instructions  03/30/17: Last dose of Coumadin.  03/31/17: No Coumadin or Lovenox.  04/01/17: Inject Lovenox 80mg  in the fatty abdominal tissue at least 2 inches from the belly button twice a day about 12 hours apart, 8am and 8pm rotate sites. No Coumadin.  04/02/17: Inject Lovenox in the fatty tissue every 12 hours, 8am and 8pm. No Coumadin.  04/03/17: Inject Lovenox in the fatty tissue every 12 hours, 8am and 8pm. No Coumadin.  04/04/17: Inject Lovenox in the fatty tissue in the morning at 8 am (No PM dose). No Coumadin.  04/05/17: Procedure Day - No Lovenox -Check INR in the AM before procedure & Resume Coumadin in the evening or as directed by doctor.   04/06/17: Resume Lovenox inject in the fatty tissue every 12 hours, 8am and 8pm and take Coumadin.  04/07/17: Inject Lovenox in the fatty tissue every 12 hours,  8am and 8pm and take Coumadin.  04/08/17: Inject Lovenox in the fatty tissue every 12 hours, 8am and 8pm and take Coumadin.  04/09/17: Inject Lovenox in the fatty tissue every 12 hours, 8am and 8pm  and take Coumadin.  04/10/17: Inject Lovenox in the fatty tissue every 12 hours,  8am and 8pm and take Coumadin.  04/11/17: Inject Lovenox in the fatty tissue at [facility] INR.

## 2017-04-02 ENCOUNTER — Encounter (HOSPITAL_COMMUNITY): Payer: Self-pay

## 2017-04-02 ENCOUNTER — Other Ambulatory Visit: Payer: Self-pay

## 2017-04-02 ENCOUNTER — Emergency Department (HOSPITAL_COMMUNITY)
Admission: EM | Admit: 2017-04-02 | Discharge: 2017-04-03 | Disposition: A | Discharge status: Home / Self Care | Payer: Medicare Other | Attending: Emergency Medicine | Admitting: Emergency Medicine

## 2017-04-02 DIAGNOSIS — W07XXXA Fall from chair, initial encounter: Secondary | ICD-10-CM | POA: Insufficient documentation

## 2017-04-02 DIAGNOSIS — I4891 Unspecified atrial fibrillation: Secondary | ICD-10-CM | POA: Insufficient documentation

## 2017-04-02 DIAGNOSIS — Z87891 Personal history of nicotine dependence: Secondary | ICD-10-CM | POA: Insufficient documentation

## 2017-04-02 DIAGNOSIS — I509 Heart failure, unspecified: Secondary | ICD-10-CM | POA: Diagnosis not present

## 2017-04-02 DIAGNOSIS — Z79899 Other long term (current) drug therapy: Secondary | ICD-10-CM | POA: Diagnosis not present

## 2017-04-02 DIAGNOSIS — I2510 Atherosclerotic heart disease of native coronary artery without angina pectoris: Secondary | ICD-10-CM | POA: Diagnosis not present

## 2017-04-02 DIAGNOSIS — S60512A Abrasion of left hand, initial encounter: Secondary | ICD-10-CM | POA: Insufficient documentation

## 2017-04-02 DIAGNOSIS — S3992XA Unspecified injury of lower back, initial encounter: Secondary | ICD-10-CM | POA: Diagnosis not present

## 2017-04-02 DIAGNOSIS — E039 Hypothyroidism, unspecified: Secondary | ICD-10-CM | POA: Insufficient documentation

## 2017-04-02 DIAGNOSIS — Z7982 Long term (current) use of aspirin: Secondary | ICD-10-CM | POA: Diagnosis not present

## 2017-04-02 DIAGNOSIS — S31010A Laceration without foreign body of lower back and pelvis without penetration into retroperitoneum, initial encounter: Secondary | ICD-10-CM | POA: Diagnosis not present

## 2017-04-02 DIAGNOSIS — S51812A Laceration without foreign body of left forearm, initial encounter: Secondary | ICD-10-CM | POA: Diagnosis not present

## 2017-04-02 DIAGNOSIS — S299XXA Unspecified injury of thorax, initial encounter: Secondary | ICD-10-CM | POA: Diagnosis not present

## 2017-04-02 DIAGNOSIS — Y929 Unspecified place or not applicable: Secondary | ICD-10-CM | POA: Diagnosis not present

## 2017-04-02 DIAGNOSIS — Z7901 Long term (current) use of anticoagulants: Secondary | ICD-10-CM | POA: Insufficient documentation

## 2017-04-02 DIAGNOSIS — Y999 Unspecified external cause status: Secondary | ICD-10-CM | POA: Diagnosis not present

## 2017-04-02 DIAGNOSIS — S61412A Laceration without foreign body of left hand, initial encounter: Secondary | ICD-10-CM | POA: Diagnosis not present

## 2017-04-02 DIAGNOSIS — Y939 Activity, unspecified: Secondary | ICD-10-CM | POA: Diagnosis not present

## 2017-04-02 DIAGNOSIS — T148XXA Other injury of unspecified body region, initial encounter: Secondary | ICD-10-CM

## 2017-04-02 DIAGNOSIS — S20419A Abrasion of unspecified back wall of thorax, initial encounter: Secondary | ICD-10-CM | POA: Diagnosis not present

## 2017-04-02 DIAGNOSIS — S41109A Unspecified open wound of unspecified upper arm, initial encounter: Secondary | ICD-10-CM | POA: Diagnosis not present

## 2017-04-02 DIAGNOSIS — W19XXXA Unspecified fall, initial encounter: Secondary | ICD-10-CM

## 2017-04-02 DIAGNOSIS — Z95810 Presence of automatic (implantable) cardiac defibrillator: Secondary | ICD-10-CM | POA: Diagnosis not present

## 2017-04-02 DIAGNOSIS — S6992XA Unspecified injury of left wrist, hand and finger(s), initial encounter: Secondary | ICD-10-CM | POA: Diagnosis present

## 2017-04-02 DIAGNOSIS — Z951 Presence of aortocoronary bypass graft: Secondary | ICD-10-CM | POA: Insufficient documentation

## 2017-04-02 NOTE — ED Triage Notes (Signed)
Pt. From home via EMS after a fall two days ago. Pt. obtained skin tears to left hand and back. Pt. Currently on Lovenox. Pt. A/o X4. NAD at this time.

## 2017-04-02 NOTE — ED Notes (Signed)
Daughter, [name] (who works in [employer]) came to the ED waiting room and badged family members back to Pod C where patient was.

## 2017-04-02 NOTE — ED Provider Notes (Signed)
[facility] Provider Note   CSN: [id number] Arrival date & time: 04/02/17  2304     History   Chief Complaint No chief complaint on file.   HPI [name] is a 84 y.o. male.  HPI   [name] is a 84 y.o. male, with a history of A. fib, CHF, CAD, GERD, and MI, presenting to the ED with persistently bleeding wounds following a fall 2 days ago. Was trying to sit down in a chair, cushion slipped, patient slid to the floor. Sustained skin tears to back and left hand. Has had intermittent bleeding since then. Was switched from warfarin to lovenox two days ago due to scheduled kyphoplasty this week to repair L3 fracture.  Patient denies head injury, dizziness, neck pain, back pain, neuro deficits, chest pain, shortness of breath, abdominal pain, or any other complaints.     Past Medical History:  Diagnosis Date  . AF (atrial fibrillation) ([name])    Has not tolerated amiodarone in the past. Amiodarone was stopped in September of 2010 due to side effects  . Aortic stenosis, severe    WITH PERCUTANEOUS AORTIC VALVE (TAVI) IN March 2012 at the [facility]  . Cervical myelopathy ([name])   . CHF (congestive heart failure) ([place])   . Chronic anticoagulation    on coumadin  . Coronary artery disease   . Diverticulitis    CURRENTLY CONTROLLED WITH NO EVIDENCE OF RECURRENT INFECTION  . Esophageal stricture    WITH DILATATION  . GERD (gastroesophageal reflux disease)   . Heart murmur   . Hyperlipidemia   . Ischemic cardiomyopathy 05/2010   Has EF of 25%  . MI (myocardial infarction) ([name]) 1974, 1978  . NSVT (nonsustained ventricular tachycardia) ([place])   . Osteoarthritis    RIGHT KNEE  . Osteoporosis   . Prostate cancer ([place])   . Pulmonary embolism ([name])   . Stroke [name])    3 strokes    Patient Active Problem List   Diagnosis Date Noted  . Pressure injury of skin 01/21/2017  . Acute urinary retention 01/21/2017  . Shortness of  breath   . Weakness 09/01/2015  . Hyponatremia 09/01/2015  . Hypothyroidism 09/01/2015  . DOE (dyspnea on exertion) 09/01/2015  . Chronic systolic CHF (congestive heart failure) ([name]) 09/01/2015  . Exertional dyspnea 09/01/2015  . GI bleed 01/16/2014  . GIB (gastrointestinal bleeding) 01/16/2014  . Encounter for therapeutic drug monitoring 05/09/2013  . Automatic implantable cardioverter-defibrillator in situ 12/13/2012  . Rectal bleed 05/30/2012    Class: Acute  . Acute low back pain 05/30/2012    Class: Acute  . Hypotension 05/30/2012    Class: Acute  . Vertebral fracture, osteoporotic ([place]) 05/21/2012  . Gait instability 12/02/2011    Class: Acute  . Atrial fibrillation, chronic ([name]) 12/02/2011    Class: Chronic  . Anticoagulated on Coumadin 12/02/2011  . Deformity, finger acquired 12/19/2010  . Chronic cough 12/05/2010  . S/P aortic valve replacement 10/22/2010  . Ischemic cardiomyopathy   . Diverticulitis   . Prostate cancer ([name])   . Osteoarthritis   . Cervical myelopathy ([name])   . Pulmonary embolism ([place])   . Hyperlipidemia   . GERD (gastroesophageal reflux disease)   . Esophageal stricture   . Indigestion 06/29/2010  . VENTRICULAR TACHYCARDIA 01/15/2009    Past Surgical History:  Procedure Laterality Date  . AORTIC VALVE REPLACEMENT     Percutaneous AVR in March 2012 at the [facility]  .  BACK SURGERY    . CARDIAC CATHETERIZATION  2010   SEVERE LV DYSFUNCTION WITH ESTIMATED EJECTION FRACTION OF 25%  . CARDIOVERSION  02/16/2011   Procedure: CARDIOVERSION;  Surgeon: [name];  Location: [facility];  Service: Cardiovascular;  Laterality: N/A;  . CHOLECYSTECTOMY    . CORONARY ARTERY BYPASS GRAFT  1979  . CORONARY ARTERY BYPASS GRAFT  1991   REDO SURGERY  . ICD  Feb 2003; 02/2013   gen change 02-11-2013 by Dr [name]  . IMPLANTABLE CARDIOVERTER DEFIBRILLATOR (ICD) GENERATOR CHANGE N/A 02/10/2013   Procedure: ICD GENERATOR CHANGE;  Surgeon: [name];  Location: [facility] CATH LAB;  Service: Cardiovascular;  Laterality: N/A;  . SHOULDER SURGERY         Home Medications    Prior to Admission medications   Medication Sig Start Date End Date Taking? Authorizing Provider  acetaminophen (TYLENOL) 500 MG tablet Take 1,000 mg by mouth every 6 (six) hours as needed for mild pain.     Provider, Historical, [name]  albuterol (PROVENTIL HFA;VENTOLIN HFA) 108 (90 Base) MCG/ACT inhaler Inhale 1-2 puffs into the lungs every 6 (six) hours as needed for wheezing or shortness of breath.    Provider, Historical, [name]  alfuzosin (UROXATRAL) 10 MG 24 hr tablet Take 10 mg by mouth at bedtime.     Provider, Historical, [name]  aspirin 81 MG tablet Take 81 mg by mouth daily.    Provider, Historical, [name]  atorvastatin (LIPITOR) 10 MG tablet Take 10 mg by mouth every other day.    Provider, Historical, [name]  benzonatate (TESSALON) 100 MG capsule Take 100 mg by mouth 3 (three) times daily as needed for cough.    Provider, Historical, [name]  Budesonide-Formoterol Fumarate (SYMBICORT IN) Inhale 1 puff into the lungs 2 (two) times daily.    Provider, Historical, [name]  carvedilol (COREG) 6.25 MG tablet TAKE 1 TABLET TWICE DAILY WITH A MEAL. 07/24/16   Sherren Mocha, [name]  cholecalciferol (VITAMIN D) 1000 UNITS tablet Take 1,000 Units by mouth 2 (two) times daily.    Provider, Historical, [name]  COUMADIN 5 MG tablet TAKE AS DIRECTED BY COUMADIN CLINIC. 02/26/17   Sherren Mocha, [name]  dicyclomine (BENTYL) 20 MG tablet Take 1 tablet (20 mg total) by mouth 2 (two) times daily as needed for spasms. 07/06/16   Carlisle Cater, PA-[name]  enoxaparin (LOVENOX) 80 MG/0.8ML injection Inject 0.8 mLs (80 mg total) into the skin every 12 (twelve) hours. 03/26/17   Sherren Mocha, [name]  furosemide (LASIX) 20 MG tablet TAKE (1) TABLET DAILY AS NEEDED FOR SWELLING. 06/19/16   Sherren Mocha, [name]  ipratropium (ATROVENT) 0.03 % nasal spray [name] 2 sprays into both nostrils 2 (two) times daily.    Provider,  Historical, [name]  lactulose (CEPHULAC) 10 g packet Take 1 packet (10 g total) daily as needed by mouth (for severe constipation). 01/22/17   Dessa Phi, DO  levofloxacin (LEVAQUIN) 250 MG tablet Take 1 tablet (250 mg total) at bedtime by mouth. 01/22/17   Dessa Phi, DO  levothyroxine (SYNTHROID) 125 MCG tablet Take 1 tablet (125 mcg total) by mouth daily before breakfast. 09/02/15   Hosie Poisson, [name]  linaclotide (LINZESS) 145 MCG CAPS capsule Take 145 mcg by mouth daily before breakfast.    Provider, Historical, [name]  nitroGLYCERIN (NITROSTAT) 0.4 MG SL tablet [name] 1 tablet (0.4 mg total) under the tongue every 5 (five) minutes x 3 doses as needed for chest pain (Max 3 doses in 15 min.  Call 911). 12/06/16   Sherren Mocha, [name]  omeprazole (PRILOSEC) 20 MG capsule Take 20 mg by mouth daily as needed (acid reflux).    Provider, Historical, [name]  polyethylene glycol (MIRALAX / GLYCOLAX) packet Take 17 g by mouth daily as needed for mild constipation.     Provider, Historical, [name]  Probiotic Product (PROBIOTIC DAILY PO) Take 1 capsule by mouth daily.    Provider, Historical, [name]  RESTASIS 0.05 % ophthalmic emulsion [name] 1 drop into both eyes 2 (two) times daily as needed (dry eyes).  12/05/11   Provider, Historical, [name]  traMADol (ULTRAM) 50 MG tablet Take 50 mg by mouth every 6 (six) hours as needed for moderate pain.    Provider, Historical, [name]    Family History Family History  Problem Relation Age of Onset  . Heart disease Mother 35  . Diabetes Mother 27  . Heart disease Father 74    Social History Social History   Tobacco Use  . Smoking status: Former Smoker    Packs/day: 4.00    Years: 15.00    Pack years: 60.00    Types: Cigarettes    Last attempt to quit: 06/15/1969    Years since quitting: 47.8  . Smokeless tobacco: Never Used  Substance Use Topics  . Alcohol use: No  . Drug use: No     Allergies   Penicillins; Tizanidine; Ace inhibitors; Antihistamines,  diphenhydramine-type; Clemastine fumarate; Dimenhydrinate; and Amiodarone   Review of Systems Review of Systems  Respiratory: Negative for shortness of breath.   Cardiovascular: Negative for chest pain.  Gastrointestinal: Negative for abdominal pain.  Musculoskeletal: Negative for back pain and neck pain.  Skin: Positive for wound.  Neurological: Negative for dizziness, syncope, weakness and numbness.  All other systems reviewed and are negative.    Physical Exam Updated Vital Signs BP 104/73 (BP Location: Right Arm)   Pulse 76   Temp (!) 96.9 F (36.1 [name]) (Oral)   Resp 17   Ht 6\' 2"  (1.88 m)   Wt 73.5 kg (162 lb)   SpO2 99%   BMI 20.80 kg/m   Physical Exam  Constitutional: He appears well-developed and well-nourished. No distress.  HENT:  Head: Normocephalic and atraumatic.  Eyes: Conjunctivae are normal.  Neck: Neck supple.  Cardiovascular: Normal rate, regular rhythm, normal heart sounds and intact distal pulses.  Pulmonary/Chest: Effort normal and breath sounds normal. No respiratory distress.  Patient shows no increased work of breathing.  Speaks in full sentences without difficulty.  Abdominal: Soft. There is no tenderness. There is no guarding.  Musculoskeletal: He exhibits tenderness. He exhibits no edema.  Patient has some tenderness in the region of L3, which he states is consistent with his known L3 fracture. No other midline spinal tenderness.  Full range of motion without pain in the cardinal directions of movement in the left fingers, hand, wrist, elbow, and shoulder.   Lymphadenopathy:    He has no cervical adenopathy.  Neurological: He is alert.  No noted acute sensory deficits. Strength 5/5 in lower extremities.  Able to ambulate with assistance, which is his baseline.  Skin: Skin is warm and dry. He is not diaphoretic.  Skin tears noted to the patient's back, ulnar left hand, and ulnar left forearm, as shown in pictures.  Initially covered with  Tegaderms.  Once Tegaderms were removed, there was some initial bleeding, however, bleeding was easily controlled.  Psychiatric: He has a normal mood and affect. His behavior is normal.  Nursing note  and vitals reviewed.            ED Treatments / Results  Labs (all labs ordered are listed, but only abnormal results are displayed) Labs Reviewed  PROTIME-INR - Abnormal; Notable for the following components:      Result Value   Prothrombin Time 20.1 (*)    All other components within normal limits  APTT - Abnormal; Notable for the following components:   aPTT 48 (*)    All other components within normal limits  COMPREHENSIVE METABOLIC PANEL - Abnormal; Notable for the following components:   Sodium 127 (*)    Chloride 95 (*)    Glucose, Bld 111 (*)    Calcium 8.5 (*)    Total Protein 5.3 (*)    Albumin 3.0 (*)    ALT 11 (*)    Total Bilirubin 1.3 (*)    All other components within normal limits  CBC WITH DIFFERENTIAL/PLATELET - Abnormal; Notable for the following components:   RBC 3.98 (*)    Hemoglobin 12.4 (*)    HCT 36.4 (*)    All other components within normal limits    EKG  EKG Interpretation None       Radiology Dg Thoracic Spine W/swimmers  Result Date: 04/03/2017 CLINICAL DATA:  Fall with back pain EXAM: THORACIC SPINE - 3 VIEWS COMPARISON:  Radiograph 09/01/2015 FINDINGS: Post sternotomy changes, pacing lead, and valvular prosthesis. Thoracic alignment is within normal limits. Chronic wedging at T11. Moderate severe chronic compression deformity at T12. Remaining vertebral body heights appear maintained. IMPRESSION: 1. Chronic compression deformities at T11 and T12 2. No definite acute osseous abnormality. Electronically Signed   By: Donavan Foil M.D.   On: 04/03/2017 01:48   Dg Lumbar Spine Complete  Result Date: 04/03/2017 CLINICAL DATA:  Fall EXAM: LUMBAR SPINE - COMPLETE 4+ VIEW COMPARISON:  CT 03/02/2017, 03/20/2016 FINDINGS: Lumbar alignment  similar compared to prior with kyphosis centered at T12. Retrolisthesis of L2 on L3 as before. Moderate severe chronic compression deformities at T12 and L1. Mild sclerosis in the L3 vertebral body consistent with subacute fracture, no significant vertebral body collapse. Chronic superior endplate compression at L4. Aortic atherosclerosis. Moderate degenerative changes at L2-L3 and L4-L5. IMPRESSION: 1. Sclerosis within the L3 vertebral body consistent with subacute fracture. No vertebral body collapse is seen. 2. Chronic compression fractures at T12 and L1 with chronic superior endplate deformity at L4. 3. Degenerative changes of the lumbar spine Electronically Signed   By: Donavan Foil M.D.   On: 04/03/2017 01:46    Procedures Procedures (including critical care time)  Medications Ordered in ED Medications  ALPRAZolam (XANAX) tablet 0.5 mg (not administered)  THROMBI-PAD 3"X3" pad 2 each (2 each Topical Given 04/03/17 0034)     Initial Impression / Assessment and Plan / ED Course  I have reviewed the triage vital signs and the nursing notes.  Pertinent labs & imaging results that were available during my care of the patient were reviewed by me and considered in my medical decision making (see chart for details).  Clinical Course as of Apr 03 153  Tue Apr 03, 2017  0147 Consistent with previous values. Appears to be chronic hyponatremia. Sodium: (!) 127 [SJ]    Clinical Course User Index [SJ] Chiffon Kittleson C, PA-[name]    Patient presents with skin tears following a fall 2 days ago.  Oozing hemorrhage noted on exam today.  Thrombi-Pads used applied to prevent rebleeding of these areas.  No acute abnormalities  on xrays. Follow up with spine specialist tomorrow. PCP follow up for skin tears. The patient was given instructions for home care as well as return precautions. Patient voices understanding of these instructions, accepts the plan, and is comfortable with discharge.  Findings and plan of  care discussed with Rolland Porter, [name]. Dr. Tomi Bamberger personally evaluated and examined this patient.  Final Clinical Impressions(s) / ED Diagnoses   Final diagnoses:  Fall, initial encounter  Multiple skin tears    ED Discharge Orders    None       Layla Maw 04/03/17 0156    Rolland Porter, [name] 04/03/17 [phone]

## 2017-04-03 ENCOUNTER — Emergency Department (HOSPITAL_COMMUNITY): Payer: Medicare Other

## 2017-04-03 DIAGNOSIS — S60512A Abrasion of left hand, initial encounter: Secondary | ICD-10-CM | POA: Diagnosis not present

## 2017-04-03 DIAGNOSIS — S3992XA Unspecified injury of lower back, initial encounter: Secondary | ICD-10-CM | POA: Diagnosis not present

## 2017-04-03 DIAGNOSIS — S299XXA Unspecified injury of thorax, initial encounter: Secondary | ICD-10-CM | POA: Diagnosis not present

## 2017-04-03 LAB — COMPREHENSIVE METABOLIC PANEL
ALT: 11 U/L — ABNORMAL LOW (ref 17–63)
ANION GAP: 9 (ref 5–15)
AST: 15 U/L (ref 15–41)
Albumin: 3 g/dL — ABNORMAL LOW (ref 3.5–5.0)
Alkaline Phosphatase: 69 U/L (ref 38–126)
BILIRUBIN TOTAL: 1.3 mg/dL — AB (ref 0.3–1.2)
BUN: 9 mg/dL (ref 6–20)
CO2: 23 mmol/L (ref 22–32)
Calcium: 8.5 mg/dL — ABNORMAL LOW (ref 8.9–10.3)
Chloride: 95 mmol/L — ABNORMAL LOW (ref 101–111)
Creatinine, Ser: 0.86 mg/dL (ref 0.61–1.24)
GFR calc Af Amer: >60 mL/min (ref >60)
GFR calc non Af Amer: >60 mL/min (ref >60)
Glucose, Bld: 111 mg/dL — ABNORMAL HIGH (ref 65–99)
POTASSIUM: 4 mmol/L (ref 3.5–5.1)
SODIUM: 127 mmol/L — AB (ref 135–145)
TOTAL PROTEIN: 5.3 g/dL — AB (ref 6.5–8.1)

## 2017-04-03 LAB — CBC WITH DIFFERENTIAL/PLATELET
BASOS ABS: 0 10*3/uL (ref 0.0–0.1)
Basophils Relative: 0 %
Eosinophils Absolute: 0.3 10*3/uL (ref 0.0–0.7)
Eosinophils Relative: 5 %
HEMATOCRIT: 36.4 % — AB (ref 39.0–52.0)
HEMOGLOBIN: 12.4 g/dL — AB (ref 13.0–17.0)
LYMPHS PCT: 19 %
Lymphs Abs: 1.1 10*3/uL (ref 0.7–4.0)
MCH: 31.2 pg (ref 26.0–34.0)
MCHC: 34.1 g/dL (ref 30.0–36.0)
MCV: 91.5 fL (ref 78.0–100.0)
MONO ABS: 0.5 10*3/uL (ref 0.1–1.0)
Monocytes Relative: 8 %
NEUTROS ABS: 3.8 10*3/uL (ref 1.7–7.7)
NEUTROS PCT: 68 %
Platelets: 171 10*3/uL (ref 150–400)
RBC: 3.98 MIL/uL — AB (ref 4.22–5.81)
RDW: 14.1 % (ref 11.5–15.5)
WBC: 5.6 10*3/uL (ref 4.0–10.5)

## 2017-04-03 LAB — PROTIME-INR
INR: 1.73
PROTHROMBIN TIME: 20.1 s — AB (ref 11.4–15.2)

## 2017-04-03 LAB — APTT: aPTT: 48 seconds — ABNORMAL HIGH (ref 24–36)

## 2017-04-03 MED ORDER — "THROMBI-PAD 3""X3"" EX PADS"
2.0000 | MEDICATED_PAD | Freq: Once | CUTANEOUS | Status: AC
  Administered 2017-04-03: 2 via TOPICAL
  Filled 2017-04-03: qty 2

## 2017-04-03 MED ORDER — ALPRAZOLAM 0.25 MG PO TABS
0.5000 mg | ORAL_TABLET | Freq: Once | ORAL | Status: AC
  Administered 2017-04-03: 0.5 mg via ORAL
  Filled 2017-04-03: qty 2

## 2017-04-03 NOTE — ED Notes (Signed)
Pt. To XRAY via stretcher. 

## 2017-04-03 NOTE — ED Notes (Signed)
ED Provider at bedside. 

## 2017-04-03 NOTE — ED Provider Notes (Signed)
Pt on blood thinners slid out of a chair and sustained skin tears to his left forearm and his mid back. Pt is to have kyphoplasty done this week. He was taken off his coumadin and has been on Lovenox.   Patient has about 3 skin tears and on is on the ulnar aspect of his left wrist, and 2 on his forearm also on the ulnar aspect.Marland Kitchen  He is noted to have 2 more superficial areas near the midline of his lumbar spine.  Patient is alert and cooperative, when the Tegaderm was removed he did have some bleeding of his forearm.   Medical screening examination/treatment/procedure(s) were conducted as a shared visit with non-physician practitioner(s) and myself.  I personally evaluated the patient during the encounter.   EKG Interpretation None       Rolland Porter, MD, Barbette Or, MD 04/03/17 [phone]

## 2017-04-03 NOTE — Discharge Instructions (Signed)
There were no acute abnormalities on imaging studies at this evening.  Lab results were also encouraging. Follow-up with your primary care provider for continued evaluation and treatment of the skin tear wounds. Follow-up with your spinal specialist to make them aware of the fall. Return to the ED as needed.

## 2017-04-04 DIAGNOSIS — Z6822 Body mass index (BMI) 22.0-22.9, adult: Secondary | ICD-10-CM | POA: Diagnosis not present

## 2017-04-04 DIAGNOSIS — R2689 Other abnormalities of gait and mobility: Secondary | ICD-10-CM | POA: Diagnosis not present

## 2017-04-04 DIAGNOSIS — E871 Hypo-osmolality and hyponatremia: Secondary | ICD-10-CM | POA: Diagnosis not present

## 2017-04-04 DIAGNOSIS — Z7901 Long term (current) use of anticoagulants: Secondary | ICD-10-CM | POA: Diagnosis not present

## 2017-04-04 DIAGNOSIS — I482 Chronic atrial fibrillation: Secondary | ICD-10-CM | POA: Diagnosis not present

## 2017-04-04 DIAGNOSIS — M545 Low back pain: Secondary | ICD-10-CM | POA: Diagnosis not present

## 2017-04-04 DIAGNOSIS — R5383 Other fatigue: Secondary | ICD-10-CM | POA: Diagnosis not present

## 2017-04-04 DIAGNOSIS — T07XXXA Unspecified multiple injuries, initial encounter: Secondary | ICD-10-CM | POA: Diagnosis not present

## 2017-04-04 DIAGNOSIS — M1990 Unspecified osteoarthritis, unspecified site: Secondary | ICD-10-CM | POA: Diagnosis not present

## 2017-04-05 DIAGNOSIS — M47816 Spondylosis without myelopathy or radiculopathy, lumbar region: Secondary | ICD-10-CM | POA: Diagnosis not present

## 2017-04-05 DIAGNOSIS — M810 Age-related osteoporosis without current pathological fracture: Secondary | ICD-10-CM | POA: Diagnosis not present

## 2017-04-05 DIAGNOSIS — M48061 Spinal stenosis, lumbar region without neurogenic claudication: Secondary | ICD-10-CM | POA: Diagnosis not present

## 2017-04-08 ENCOUNTER — Other Ambulatory Visit: Payer: Self-pay | Admitting: Cardiovascular Disease

## 2017-04-11 ENCOUNTER — Ambulatory Visit (INDEPENDENT_AMBULATORY_CARE_PROVIDER_SITE_OTHER): Payer: Medicare Other

## 2017-04-11 DIAGNOSIS — Z7901 Long term (current) use of anticoagulants: Secondary | ICD-10-CM | POA: Diagnosis not present

## 2017-04-11 DIAGNOSIS — Z5181 Encounter for therapeutic drug level monitoring: Secondary | ICD-10-CM

## 2017-04-11 DIAGNOSIS — I482 Chronic atrial fibrillation, unspecified: Secondary | ICD-10-CM

## 2017-04-11 DIAGNOSIS — M47816 Spondylosis without myelopathy or radiculopathy, lumbar region: Secondary | ICD-10-CM | POA: Diagnosis not present

## 2017-04-11 DIAGNOSIS — M1711 Unilateral primary osteoarthritis, right knee: Secondary | ICD-10-CM | POA: Diagnosis not present

## 2017-04-11 LAB — POCT INR: INR: 1.5

## 2017-04-11 NOTE — Patient Instructions (Signed)
Description   Spoke with pt and pt's caregiver, advised to continue Lovenox 80mg  injections every 12 hrs. Take 7.5mg  today and tomorrow, then recheck INR on Friday 04/13/17.

## 2017-04-13 ENCOUNTER — Ambulatory Visit (INDEPENDENT_AMBULATORY_CARE_PROVIDER_SITE_OTHER): Payer: Medicare Other | Admitting: Cardiology

## 2017-04-13 DIAGNOSIS — I482 Chronic atrial fibrillation, unspecified: Secondary | ICD-10-CM

## 2017-04-13 DIAGNOSIS — Z5181 Encounter for therapeutic drug level monitoring: Secondary | ICD-10-CM

## 2017-04-13 LAB — POCT INR: INR: 2

## 2017-04-16 ENCOUNTER — Ambulatory Visit (INDEPENDENT_AMBULATORY_CARE_PROVIDER_SITE_OTHER): Payer: Medicare Other

## 2017-04-16 DIAGNOSIS — I5022 Chronic systolic (congestive) heart failure: Secondary | ICD-10-CM | POA: Diagnosis not present

## 2017-04-16 DIAGNOSIS — Z95810 Presence of automatic (implantable) cardiac defibrillator: Secondary | ICD-10-CM

## 2017-04-16 NOTE — Progress Notes (Signed)
EPIC Encounter for ICM Monitoring  Patient Name: [name] is a 84 y.o. male Date: 04/16/2017 Primary Care Physican: [name], MD Primary Cardiologist:[place] Electrophysiologist: Caryl Comes Dry Weight:163.8 lbs           Heart Failure questions reviewed, pt asymptomatic.   Thoracic impedance abnormal suggesting fluid accumulation..  Prescribed dosage: Furosemide 20 mg 1 tablet as needed  Labs: 04/03/2017 Creatinine 0.86, BUN 9,   Potassium 4.0, Sodium 127, EGFR >60 01/25/2017 Creatinine 0.90, BUN 9,   Potassium 4.3, Sodium 125  01/22/2017 Creatinine 0.85, BUN 7,   Potassium 4.0, Sodium 125, EGFR >60  01/21/2017 Creatinine 0.84, BUN 11, Potassium 4.0, Sodium 124, EGFR >60  01/20/2017 Creatinine 0.70, BUN 14, Potassium 4.5, Sodium 122  07/05/2016 Creatinine 0.94, BUN 14, Potassium 4.6, Sodium 127, EGFR >60 04/19/2016 Creatinine 0.9, BUN 13, Potassium 4.8, Sodium 131, EGFR 80.8 to 97.8  Recommendations: Advised to take PRN Furosemide x 1 day.   Encouraged to call for fluid symptoms.  Follow-up plan: ICM clinic phone appointment on 05/17/2017.    Copy of ICM check sent to Dr. Caryl Comes and Dr. Burt Knack.   3 month ICM trend: 04/16/2017    1 Year ICM trend:       Rosalene Billings, RN 04/16/2017 12:23 PM

## 2017-04-17 ENCOUNTER — Ambulatory Visit (INDEPENDENT_AMBULATORY_CARE_PROVIDER_SITE_OTHER): Payer: Medicare Other | Admitting: Cardiovascular Disease

## 2017-04-17 DIAGNOSIS — I482 Chronic atrial fibrillation, unspecified: Secondary | ICD-10-CM

## 2017-04-17 DIAGNOSIS — Z5181 Encounter for therapeutic drug level monitoring: Secondary | ICD-10-CM | POA: Diagnosis not present

## 2017-04-17 LAB — POCT INR: INR: 2.1

## 2017-04-20 ENCOUNTER — Ambulatory Visit (INDEPENDENT_AMBULATORY_CARE_PROVIDER_SITE_OTHER): Payer: Medicare Other | Admitting: Internal Medicine

## 2017-04-20 DIAGNOSIS — Z5181 Encounter for therapeutic drug level monitoring: Secondary | ICD-10-CM | POA: Diagnosis not present

## 2017-04-20 DIAGNOSIS — I482 Chronic atrial fibrillation, unspecified: Secondary | ICD-10-CM

## 2017-04-20 DIAGNOSIS — Z952 Presence of prosthetic heart valve: Secondary | ICD-10-CM

## 2017-04-20 LAB — POCT INR: INR: 2.8

## 2017-04-20 NOTE — Patient Instructions (Signed)
Description   Spoke with pt No more Lovenox  Resume normal dose of coumadin  1 tablet daily except 1/2 tablet on Wednesdays. Recheck INR 2 weeks.

## 2017-04-24 DIAGNOSIS — S32001A Stable burst fracture of unspecified lumbar vertebra, initial encounter for closed fracture: Secondary | ICD-10-CM | POA: Diagnosis not present

## 2017-04-26 DIAGNOSIS — M47816 Spondylosis without myelopathy or radiculopathy, lumbar region: Secondary | ICD-10-CM | POA: Diagnosis not present

## 2017-04-26 DIAGNOSIS — I509 Heart failure, unspecified: Secondary | ICD-10-CM | POA: Diagnosis not present

## 2017-04-26 DIAGNOSIS — I429 Cardiomyopathy, unspecified: Secondary | ICD-10-CM | POA: Diagnosis not present

## 2017-04-26 DIAGNOSIS — M1711 Unilateral primary osteoarthritis, right knee: Secondary | ICD-10-CM | POA: Diagnosis not present

## 2017-04-26 DIAGNOSIS — M48061 Spinal stenosis, lumbar region without neurogenic claudication: Secondary | ICD-10-CM | POA: Diagnosis not present

## 2017-04-26 DIAGNOSIS — M810 Age-related osteoporosis without current pathological fracture: Secondary | ICD-10-CM | POA: Diagnosis not present

## 2017-04-27 DIAGNOSIS — E7849 Other hyperlipidemia: Secondary | ICD-10-CM | POA: Diagnosis not present

## 2017-04-27 DIAGNOSIS — R7309 Other abnormal glucose: Secondary | ICD-10-CM | POA: Diagnosis not present

## 2017-04-27 DIAGNOSIS — Z125 Encounter for screening for malignant neoplasm of prostate: Secondary | ICD-10-CM | POA: Diagnosis not present

## 2017-04-27 DIAGNOSIS — E559 Vitamin D deficiency, unspecified: Secondary | ICD-10-CM | POA: Diagnosis not present

## 2017-04-27 DIAGNOSIS — E038 Other specified hypothyroidism: Secondary | ICD-10-CM | POA: Diagnosis not present

## 2017-05-01 DIAGNOSIS — I429 Cardiomyopathy, unspecified: Secondary | ICD-10-CM | POA: Diagnosis not present

## 2017-05-01 DIAGNOSIS — M810 Age-related osteoporosis without current pathological fracture: Secondary | ICD-10-CM | POA: Diagnosis not present

## 2017-05-01 DIAGNOSIS — M48061 Spinal stenosis, lumbar region without neurogenic claudication: Secondary | ICD-10-CM | POA: Diagnosis not present

## 2017-05-01 DIAGNOSIS — M1711 Unilateral primary osteoarthritis, right knee: Secondary | ICD-10-CM | POA: Diagnosis not present

## 2017-05-01 DIAGNOSIS — M47816 Spondylosis without myelopathy or radiculopathy, lumbar region: Secondary | ICD-10-CM | POA: Diagnosis not present

## 2017-05-01 DIAGNOSIS — I509 Heart failure, unspecified: Secondary | ICD-10-CM | POA: Diagnosis not present

## 2017-05-03 ENCOUNTER — Ambulatory Visit (INDEPENDENT_AMBULATORY_CARE_PROVIDER_SITE_OTHER): Payer: Medicare Other | Admitting: Cardiology

## 2017-05-03 DIAGNOSIS — I482 Chronic atrial fibrillation, unspecified: Secondary | ICD-10-CM

## 2017-05-03 DIAGNOSIS — Z5181 Encounter for therapeutic drug level monitoring: Secondary | ICD-10-CM

## 2017-05-03 LAB — POCT INR: INR: 2.8

## 2017-05-05 IMAGING — CT CT HEAD W/O CM
4 series · 16 of 47 positions shown, 18 images · non-contrast
Comparison: 12/03/2011 CT head.

CLINICAL DATA: 83 y/o  M; status post fall.

EXAM:
CT HEAD WITHOUT CONTRAST
TECHNIQUE: Contiguous axial images were obtained from the base of the skull
through the vertex without intravenous contrast.

[Series 2: head without · axial · non-contrast · 0.52mm/px · z∈[-50,+74]mm · 7 of 35 slices shown, 9 images]
[im 5/35  brain]
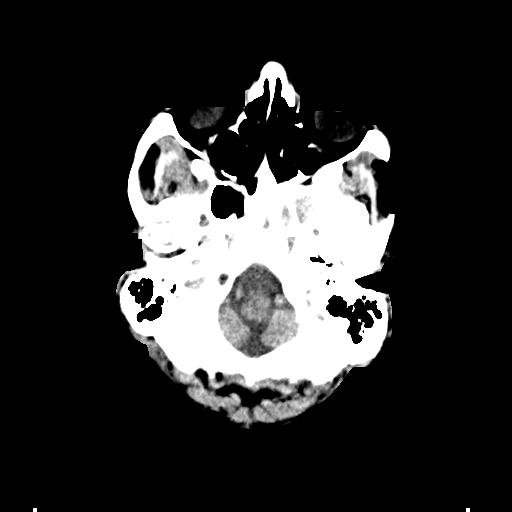
[im 5/35  bone]
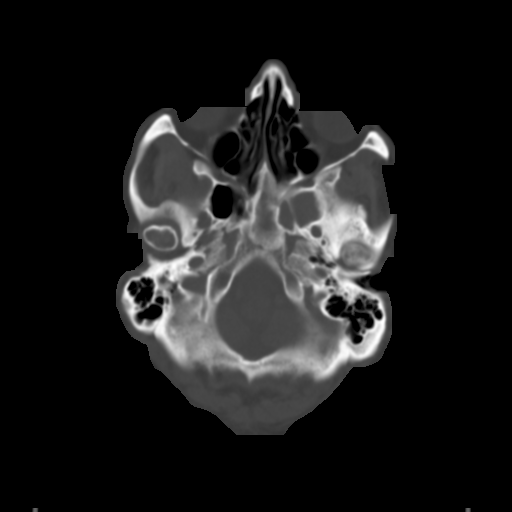
[im 9/35  brain]
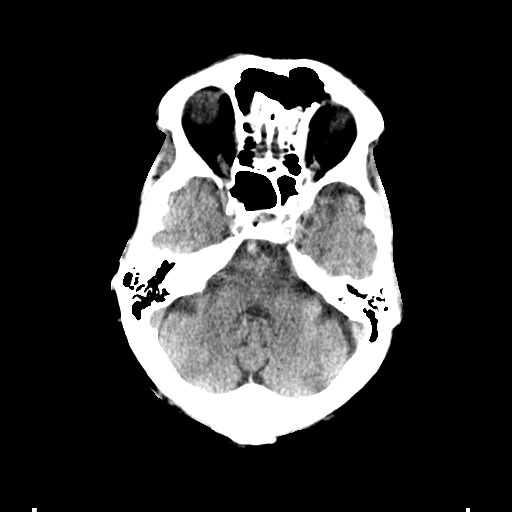
[im 13/35  brain]
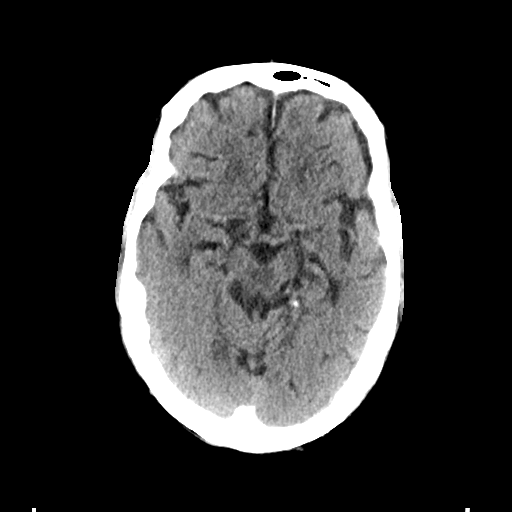
[im 18/35  brain]
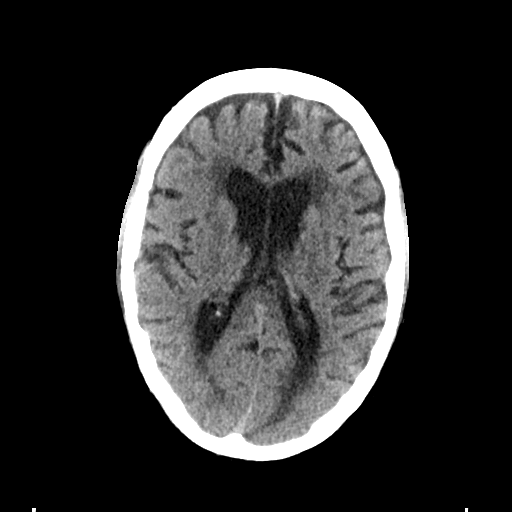
[im 22/35  brain]
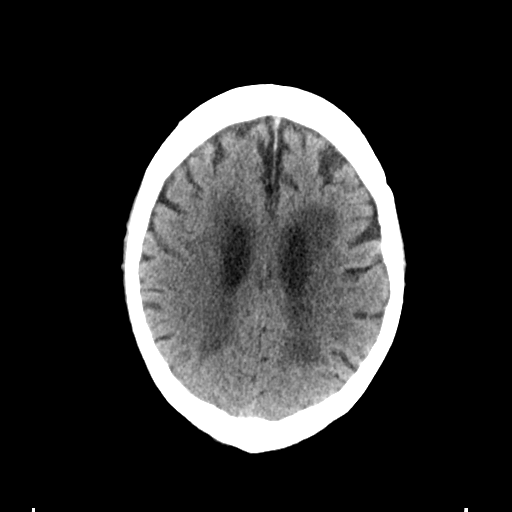
[im 22/35  bone]
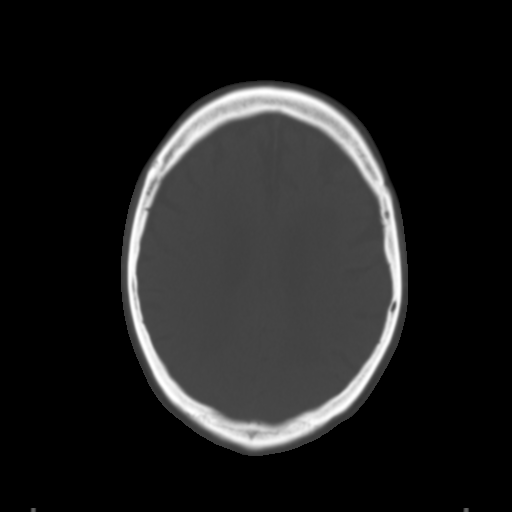
[im 26/35  brain]
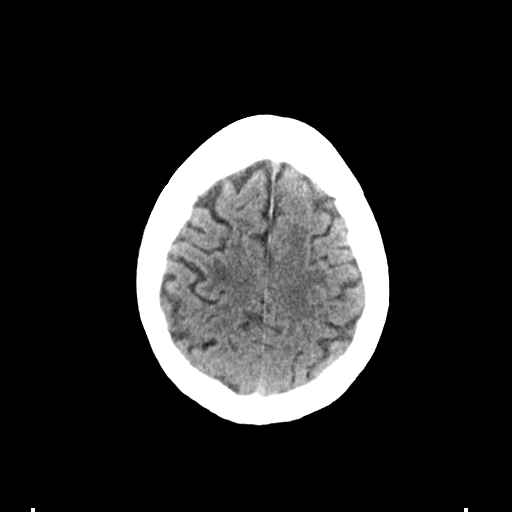
[im 30/35  brain]
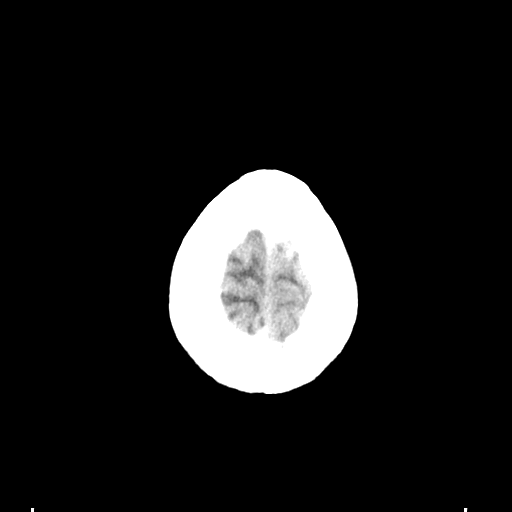

[Series 3: head bone · axial · 0.52mm/px · z∈[-55,-21]mm · 3 of 87 slices shown]
[im 9/87  bone]
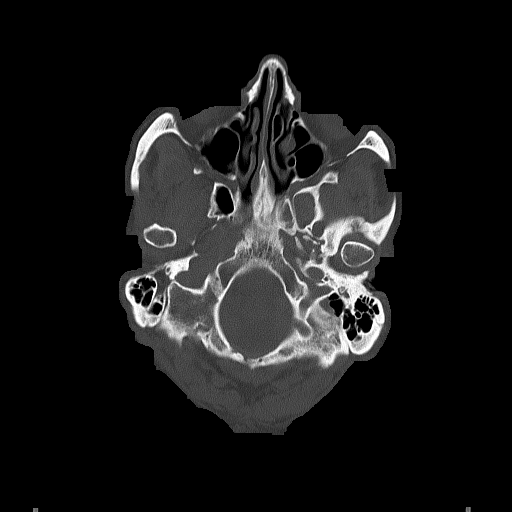
[im 18/87  bone]
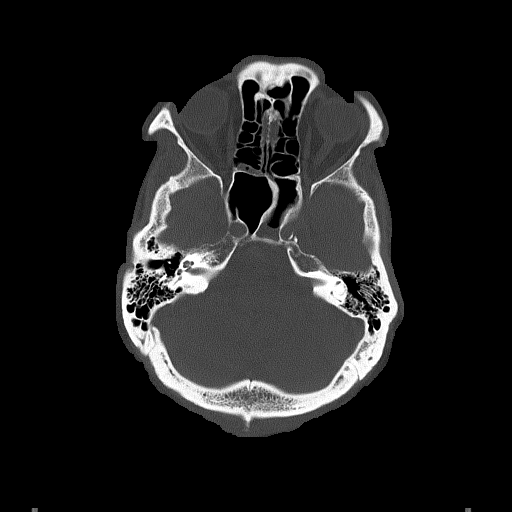
[im 26/87  bone]
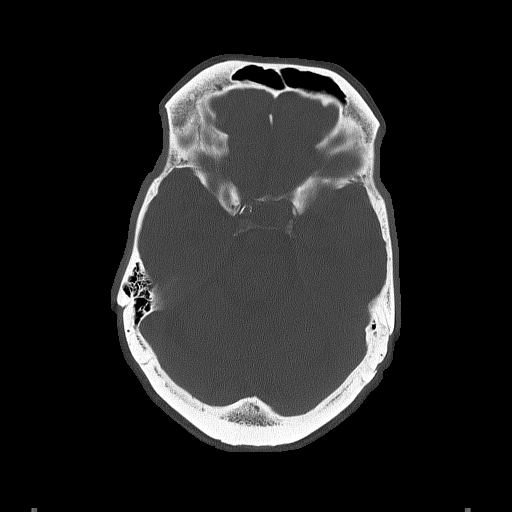

[Series 4: head without cor · coronal · non-contrast · 0.35mm/px · 3 of 73 slices shown]
[im 25/73  brain]
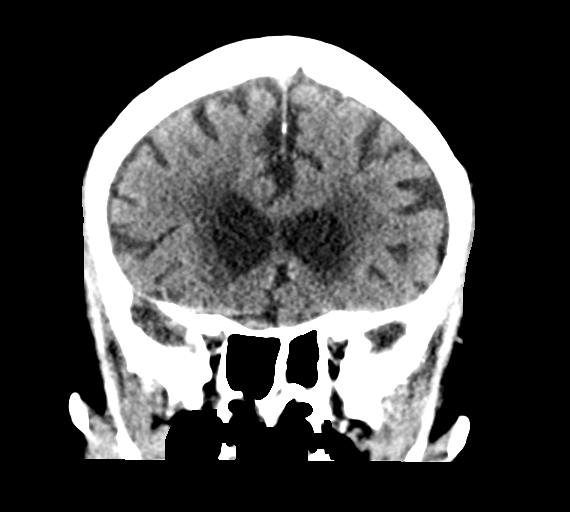
[im 33/73  brain]
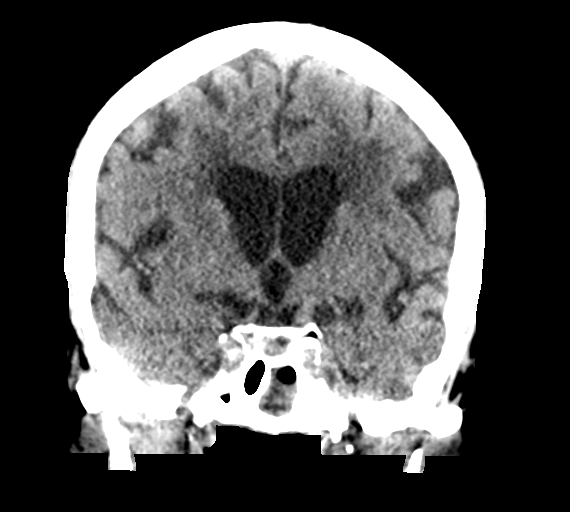
[im 41/73  brain]
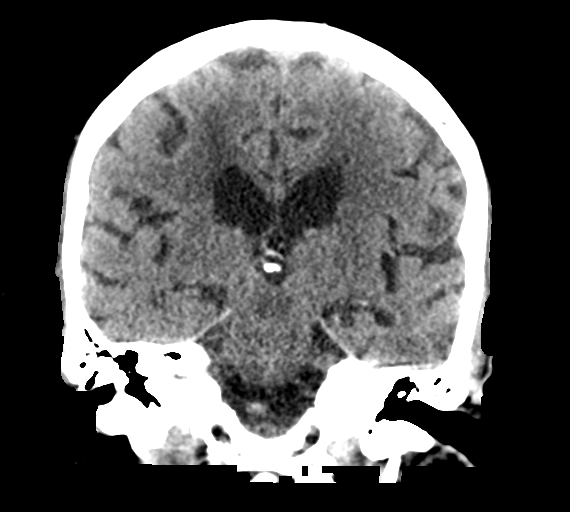

[Series 5: head without sag · sagittal · non-contrast · 0.35mm/px · 3 of 67 slices shown]
[im 23/67  brain]
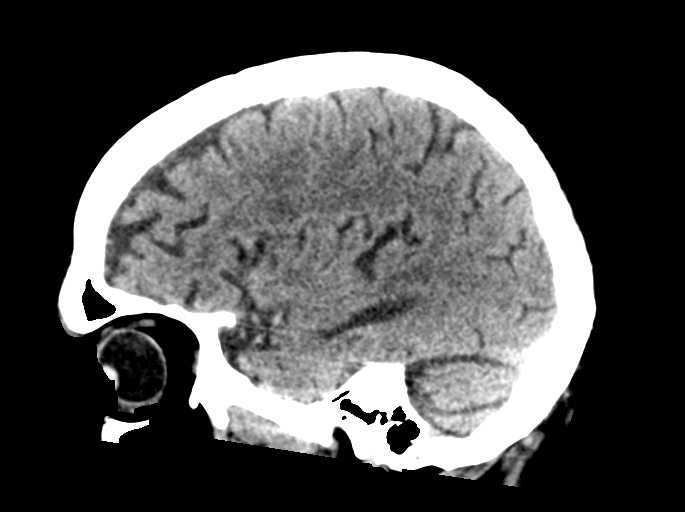
[im 34/67  brain]
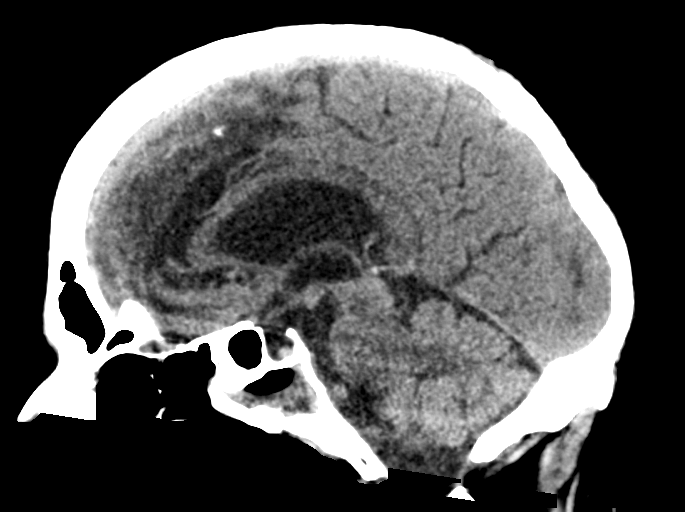
[im 45/67  brain]
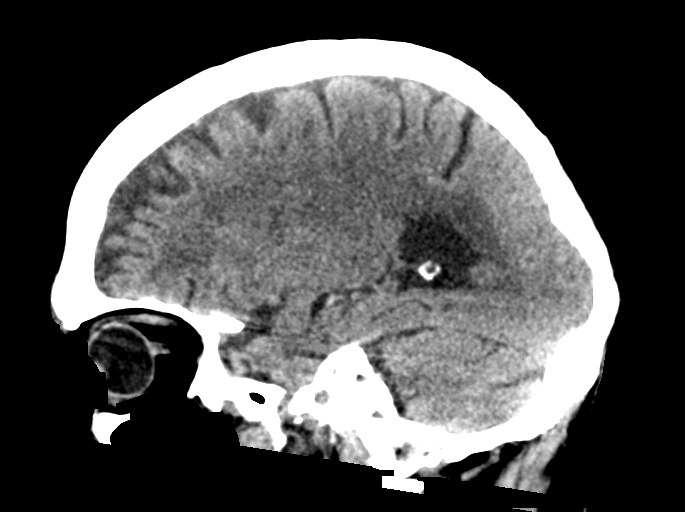

[16 of 47 positions shown; findings below may reference images not displayed]

FINDINGS: Brain: No evidence of acute infarction, hemorrhage, hydrocephalus,
extra-axial collection or mass lesion/mass effect. Moderate chronic
microvascular ischemic changes and brain parenchymal volume loss are
progressed from 1470.

Vascular: No hyperdense vessel. Calcific atherosclerosis of carotid
siphons.

Skull: Normal. Negative for fracture or focal lesion.

Sinuses/Orbits: Partial opacification of the left sphenoid sinus and
posterior ethmoid mucosal thickening. Chronic inflammatory changes
of the wall of left sphenoid sinus. Otherwise visualized paranasal
sinuses and mastoid air cells are normally aerated.

Other: None.
IMPRESSION: 1. No acute intracranial abnormality or displaced calvarial
fracture.
2. Moderate chronic microvascular ischemic changes and parenchymal
volume loss of the brain progressed from 1470.
3. Chronic left sphenoid sinusitis.

By: Wiendha Staude M.D.

## 2017-05-10 ENCOUNTER — Other Ambulatory Visit: Payer: Self-pay | Admitting: Neurosurgery

## 2017-05-10 DIAGNOSIS — S32001A Stable burst fracture of unspecified lumbar vertebra, initial encounter for closed fracture: Secondary | ICD-10-CM

## 2017-05-15 DIAGNOSIS — R82998 Other abnormal findings in urine: Secondary | ICD-10-CM | POA: Diagnosis not present

## 2017-05-15 DIAGNOSIS — Z0000 Encounter for general adult medical examination without abnormal findings: Secondary | ICD-10-CM | POA: Diagnosis not present

## 2017-05-15 DIAGNOSIS — I5022 Chronic systolic (congestive) heart failure: Secondary | ICD-10-CM | POA: Diagnosis not present

## 2017-05-15 DIAGNOSIS — E46 Unspecified protein-calorie malnutrition: Secondary | ICD-10-CM | POA: Diagnosis not present

## 2017-05-15 DIAGNOSIS — Z6824 Body mass index (BMI) 24.0-24.9, adult: Secondary | ICD-10-CM | POA: Diagnosis not present

## 2017-05-15 DIAGNOSIS — Z1389 Encounter for screening for other disorder: Secondary | ICD-10-CM | POA: Diagnosis not present

## 2017-05-15 DIAGNOSIS — I472 Ventricular tachycardia: Secondary | ICD-10-CM | POA: Diagnosis not present

## 2017-05-15 DIAGNOSIS — I2721 Secondary pulmonary arterial hypertension: Secondary | ICD-10-CM | POA: Diagnosis not present

## 2017-05-15 DIAGNOSIS — Z95810 Presence of automatic (implantable) cardiac defibrillator: Secondary | ICD-10-CM | POA: Diagnosis not present

## 2017-05-15 DIAGNOSIS — Z950 Presence of cardiac pacemaker: Secondary | ICD-10-CM | POA: Diagnosis not present

## 2017-05-15 DIAGNOSIS — I482 Chronic atrial fibrillation: Secondary | ICD-10-CM | POA: Diagnosis not present

## 2017-05-15 DIAGNOSIS — Z952 Presence of prosthetic heart valve: Secondary | ICD-10-CM | POA: Diagnosis not present

## 2017-05-15 DIAGNOSIS — I700 Atherosclerosis of aorta: Secondary | ICD-10-CM | POA: Diagnosis not present

## 2017-05-16 ENCOUNTER — Ambulatory Visit
Admission: RE | Admit: 2017-05-16 | Discharge: 2017-05-16 | Disposition: A | Discharge status: Home / Self Care | Payer: Medicare Other | Source: Ambulatory Visit | Attending: Neurosurgery | Admitting: Neurosurgery

## 2017-05-16 DIAGNOSIS — Z1212 Encounter for screening for malignant neoplasm of rectum: Secondary | ICD-10-CM | POA: Diagnosis not present

## 2017-05-16 DIAGNOSIS — M545 Low back pain: Secondary | ICD-10-CM | POA: Diagnosis not present

## 2017-05-16 DIAGNOSIS — S32001A Stable burst fracture of unspecified lumbar vertebra, initial encounter for closed fracture: Secondary | ICD-10-CM

## 2017-05-17 ENCOUNTER — Ambulatory Visit (INDEPENDENT_AMBULATORY_CARE_PROVIDER_SITE_OTHER): Payer: Medicare Other | Admitting: *Deleted

## 2017-05-17 ENCOUNTER — Telehealth: Payer: Self-pay | Admitting: *Deleted

## 2017-05-17 DIAGNOSIS — I482 Chronic atrial fibrillation, unspecified: Secondary | ICD-10-CM

## 2017-05-17 DIAGNOSIS — I5022 Chronic systolic (congestive) heart failure: Secondary | ICD-10-CM

## 2017-05-17 DIAGNOSIS — Z7901 Long term (current) use of anticoagulants: Secondary | ICD-10-CM

## 2017-05-17 DIAGNOSIS — Z5181 Encounter for therapeutic drug level monitoring: Secondary | ICD-10-CM | POA: Diagnosis not present

## 2017-05-17 DIAGNOSIS — Z952 Presence of prosthetic heart valve: Secondary | ICD-10-CM | POA: Diagnosis not present

## 2017-05-17 DIAGNOSIS — Z95810 Presence of automatic (implantable) cardiac defibrillator: Secondary | ICD-10-CM | POA: Diagnosis not present

## 2017-05-17 DIAGNOSIS — I255 Ischemic cardiomyopathy: Secondary | ICD-10-CM | POA: Diagnosis not present

## 2017-05-17 LAB — POCT INR: INR: 2.2

## 2017-05-17 NOTE — Progress Notes (Signed)
Remote ICD transmission.   

## 2017-05-17 NOTE — Telephone Encounter (Signed)
Pt states he checked INR and it read 23  Then checked again and read 24 Instructed we need to see him and check INR today Instructed to bring his machine with him when he comes and made an appt for him to be see today at 2:45pm and he states understanding

## 2017-05-17 NOTE — Patient Instructions (Signed)
Description   PT came in office and INR was 2.2 instructed to take 1 and 1/2 tablets (7.5mg ) today March 7th then continue  taking  1 tablet daily except 1/2 tablet on Wednesdays. Recheck INR 2 weeks.

## 2017-05-18 ENCOUNTER — Encounter: Payer: Self-pay | Admitting: Cardiology

## 2017-05-18 NOTE — Progress Notes (Signed)
EPIC Encounter for ICM Monitoring  Patient Name: [name] is a 84 y.o. male Date: 05/18/2017 Primary Care Physican: [name], MD Primary Cardiologist:[place] Electrophysiologist: Caryl Comes Dry Weight:163 lbs       Heart Failure questions reviewed, pt asymptomatic.   Thoracic impedance normal.  Prescribed dosage: Furosemide 20 mg 1 tablet as needed  Labs: 04/03/2017 Creatinine 0.86, BUN 9,   Potassium 4.0, Sodium 127, EGFR >60 01/25/2017 Creatinine 0.90, BUN 9,   Potassium 4.3, Sodium 125  01/22/2017 Creatinine 0.85, BUN 7,   Potassium 4.0, Sodium 125, EGFR >60  01/21/2017 Creatinine 0.84, BUN 11, Potassium 4.0, Sodium 124, EGFR >60  01/20/2017 Creatinine 0.70, BUN 14, Potassium 4.5, Sodium 122  07/05/2016 Creatinine 0.94, BUN 14, Potassium 4.6, Sodium 127, EGFR >60 04/19/2016 Creatinine 0.9, BUN 13, Potassium 4.8, Sodium 131, EGFR 80.8 to 97.8  Recommendations: No changes.   Encouraged to call for fluid symptoms.  Follow-up plan: ICM clinic phone appointment on 06/19/2017.  Office appointment scheduled 08/01/2017 with Dr. Caryl Comes.  Copy of ICM check sent to Dr. Caryl Comes.   3 month ICM trend: 05/17/2017    1 Year ICM trend:       Rosalene Billings, RN 05/18/2017 1:53 PM

## 2017-05-22 DIAGNOSIS — S32001A Stable burst fracture of unspecified lumbar vertebra, initial encounter for closed fracture: Secondary | ICD-10-CM | POA: Diagnosis not present

## 2017-05-22 DIAGNOSIS — M5417 Radiculopathy, lumbosacral region: Secondary | ICD-10-CM | POA: Diagnosis not present

## 2017-05-25 ENCOUNTER — Other Ambulatory Visit (HOSPITAL_COMMUNITY): Payer: Self-pay | Admitting: Neurosurgery

## 2017-05-25 ENCOUNTER — Telehealth: Payer: Self-pay

## 2017-05-25 DIAGNOSIS — S32001A Stable burst fracture of unspecified lumbar vertebra, initial encounter for closed fracture: Secondary | ICD-10-CM

## 2017-05-25 NOTE — Telephone Encounter (Signed)
Patient with diagnosis of Afib on warfarin for anticoagulation.    Procedure: lumbar epidural Date of procedure: TBD  CHADS2-VASc score of  6 (CHF, HTN, AGE, DM2, stroke/tia x 2 (hx PE and 3 prior strokes), CAD, AGE, male)  CrCl 80ml/min  Per office protocol, patient can hold warfarin for 5 days prior to procedure with Lovenox (enoxaparin) bridge.

## 2017-05-25 NOTE — Telephone Encounter (Signed)
   [facility] Pre-operative Risk Assessment    Request for surgical clearance:  1. What type of surgery is being performed? Lumbar Epidural steroid injection Right L5-S1  2. When is this surgery scheduled? TBD  3. What type of clearance is required Pharmacy  4. Are there any medications that need to be held prior to surgery and how long?  Coumadin, ? Need for lovenox bridge  5. Practice name and name of physician performing surgery? Laroy Apple MD  [facility]  6.    What is your office phone and fax number? Phone [phone]  Fax [phone]  7.   Anesthesia type (None, local, MAC, general) ? Left message for Dr Carolin Coy scheduler to find out anesthesia type.   [name] 05/25/2017, 1:53 PM  _________________________________________________________________   (provider comments below)

## 2017-05-26 LAB — CUP PACEART REMOTE DEVICE CHECK
Battery Remaining Longevity: 98 mo
Brady Statistic RV Percent Paced: 2.86 %
Date Time Interrogation Session: 20190307051805
HIGH POWER IMPEDANCE MEASURED VALUE: 44 Ohm
HIGH POWER IMPEDANCE MEASURED VALUE: 56 Ohm
Implantable Lead Implant Date: 20021220
Implantable Lead Model: 6944
Lead Channel Impedance Value: 456 Ohm
Lead Channel Pacing Threshold Amplitude: 1 V
Lead Channel Sensing Intrinsic Amplitude: 17.125 mV
Lead Channel Setting Pacing Amplitude: 2 V
Lead Channel Setting Pacing Pulse Width: 0.4 ms
MDC IDC LEAD LOCATION: 753860
MDC IDC MSMT BATTERY VOLTAGE: 3.01 V
MDC IDC MSMT LEADCHNL RV IMPEDANCE VALUE: 589 Ohm
MDC IDC MSMT LEADCHNL RV PACING THRESHOLD PULSEWIDTH: 0.4 ms
MDC IDC MSMT LEADCHNL RV SENSING INTR AMPL: 17.125 mV
MDC IDC PG IMPLANT DT: 20141201
MDC IDC SET LEADCHNL RV SENSING SENSITIVITY: 0.3 mV

## 2017-05-29 NOTE — Telephone Encounter (Signed)
   Okay to hold Coumadin for 5 days prior to the procedure with a Lovenox bridge.  I will route this to [facility] orthopedics.  Preop nurse is to call the patient and let him know that he needs to get an appointment with the pharmacist at least a week before the surgery is scheduled so that the Lovenox can be arranged.  Rosaria Ferries, PA-C 05/29/2017 3:35 PM Beeper [phone]

## 2017-05-29 NOTE — Telephone Encounter (Signed)
Patient is self tester. Will schedule once procedure date set.

## 2017-05-29 NOTE — Telephone Encounter (Signed)
Clearance faxed via Epic.  Message sent to pharmd to assist with appt for lovenox bridge.

## 2017-05-31 ENCOUNTER — Ambulatory Visit (HOSPITAL_COMMUNITY)
Admission: RE | Admit: 2017-05-31 | Discharge: 2017-05-31 | Disposition: A | Discharge status: Home / Self Care | Payer: Medicare Other | Source: Ambulatory Visit | Attending: Neurosurgery | Admitting: Neurosurgery

## 2017-05-31 ENCOUNTER — Ambulatory Visit (INDEPENDENT_AMBULATORY_CARE_PROVIDER_SITE_OTHER): Payer: Medicare Other | Admitting: Cardiovascular Disease

## 2017-05-31 ENCOUNTER — Encounter (HOSPITAL_COMMUNITY)
Admission: RE | Admit: 2017-05-31 | Discharge: 2017-05-31 | Disposition: A | Discharge status: Home / Self Care | Payer: Medicare Other | Source: Ambulatory Visit | Attending: Neurosurgery | Admitting: Neurosurgery

## 2017-05-31 DIAGNOSIS — I482 Chronic atrial fibrillation, unspecified: Secondary | ICD-10-CM

## 2017-05-31 DIAGNOSIS — Z5181 Encounter for therapeutic drug level monitoring: Secondary | ICD-10-CM

## 2017-05-31 DIAGNOSIS — X58XXXA Exposure to other specified factors, initial encounter: Secondary | ICD-10-CM | POA: Diagnosis not present

## 2017-05-31 DIAGNOSIS — R937 Abnormal findings on diagnostic imaging of other parts of musculoskeletal system: Secondary | ICD-10-CM | POA: Insufficient documentation

## 2017-05-31 DIAGNOSIS — S32001A Stable burst fracture of unspecified lumbar vertebra, initial encounter for closed fracture: Secondary | ICD-10-CM | POA: Insufficient documentation

## 2017-05-31 DIAGNOSIS — C61 Malignant neoplasm of prostate: Secondary | ICD-10-CM | POA: Diagnosis not present

## 2017-05-31 LAB — POCT INR: INR: 2.2

## 2017-05-31 MED ORDER — TECHNETIUM TC 99M MEDRONATE IV KIT
21.3000 | PACK | Freq: Once | INTRAVENOUS | Status: AC | PRN
  Administered 2017-05-31: 21.3 via INTRAVENOUS

## 2017-05-31 NOTE — Telephone Encounter (Signed)
The injection is scheduled for 06/15/17 at 1:45. The patient has an appointment with our coumadin clinic on 06/07/17 to initiate lovenox bridging with discontinuation of coumadin.   I will efax this to [place] and complete this from our preop pool.

## 2017-06-01 DIAGNOSIS — M1711 Unilateral primary osteoarthritis, right knee: Secondary | ICD-10-CM | POA: Diagnosis not present

## 2017-06-01 DIAGNOSIS — R5383 Other fatigue: Secondary | ICD-10-CM | POA: Diagnosis not present

## 2017-06-01 DIAGNOSIS — E559 Vitamin D deficiency, unspecified: Secondary | ICD-10-CM | POA: Diagnosis not present

## 2017-06-01 DIAGNOSIS — M810 Age-related osteoporosis without current pathological fracture: Secondary | ICD-10-CM | POA: Diagnosis not present

## 2017-06-05 ENCOUNTER — Telehealth: Payer: Self-pay

## 2017-06-05 NOTE — Telephone Encounter (Signed)
Patient's wife came to the office today to express concerns about the patient. She states he had a body bone scan recently and has more broken bones than previously thought.  She states he was previously cleared for injection in L3 but will now need L5 as well and she wants to know if Dr. Burt Knack thinks this is safe. His injection is scheduled 4/5. He has an appointment 3/28 in [facility] to discuss bridging (from previous clearance request).  She also wants to know if it will be safe from a cardiac prospective for him to have Prolia shots every 6 months.  She is very concerned because the patient has lost so much weight and "has really gone downhill." Scheduled her husband for 4 month follow-up with Dr. Burt Knack this Friday. She understands Dr. Burt Knack will discuss clearance and Prolia at that time.

## 2017-06-05 NOTE — Telephone Encounter (Signed)
thx

## 2017-06-07 ENCOUNTER — Ambulatory Visit (INDEPENDENT_AMBULATORY_CARE_PROVIDER_SITE_OTHER): Payer: Medicare Other | Admitting: *Deleted

## 2017-06-07 DIAGNOSIS — I482 Chronic atrial fibrillation, unspecified: Secondary | ICD-10-CM

## 2017-06-07 DIAGNOSIS — Z5181 Encounter for therapeutic drug level monitoring: Secondary | ICD-10-CM

## 2017-06-07 LAB — POCT INR: INR: 1.9

## 2017-06-07 MED ORDER — ENOXAPARIN SODIUM 80 MG/0.8ML ~~LOC~~ SOLN: 80.0000 mg | Freq: Two times a day (BID) | SUBCUTANEOUS | 1 refills | Status: DC

## 2017-06-07 NOTE — Patient Instructions (Addendum)
Today take 1.5 tablets, tomorrow and Saturday take 1 tablet, then discontinue Coumadin for spinal injection on 06/15/17. See patient instruction sheet.  When your restart Coumadin after your injection start taking 1 tablet daily except 1.5 tablets on Wednesdays.    06/09/17- Last dose of Coumadin.  06/10/17- No Coumadin or Lovenox.  06/11/17- Inject Lovenox 80 mg in the fatty abdominal tissue at least 2 inches from the belly button twice a day about 12 hours apart, 8am and 8pm rotate sites. No Coumadin.  06/12/17- Inject Lovenox in the fatty tissue every 12 hours, 8am and 8pm. No Coumadin.  06/13/17- Inject Lovenox in the fatty tissue every 12 hours, 8am and 8pm. No Coumadin.  06/14/17- Inject Lovenox in the fatty tissue in the morning at 8 am (No PM dose). No Coumadin.  06/15/17- Procedure Day - No Lovenox - Check INR in the AM before procedure & Resume Coumadin in the evening or as directed by doctor.   06/16/17-Resume Lovenox inject in the fatty tissue every 12 hours and take Coumadin.  06/17/17- Inject Lovenox in the fatty tissue every 12 hours and take Coumadin.  4/819- Inject Lovenox in the fatty tissue every 12 hours and take Coumadin.  06/19/17- Inject Lovenox in the fatty tissue every 12 hours and take Coumadin.  06/20/17- Inject Lovenox in the fatty tissue every 12 hours and take Coumadin.  06/21/17- Coumadin appt to check INR.

## 2017-06-08 ENCOUNTER — Ambulatory Visit: Payer: Medicare Other | Admitting: Cardiovascular Disease

## 2017-06-15 ENCOUNTER — Ambulatory Visit (INDEPENDENT_AMBULATORY_CARE_PROVIDER_SITE_OTHER): Payer: Medicare Other | Admitting: Cardiology

## 2017-06-15 DIAGNOSIS — Z7901 Long term (current) use of anticoagulants: Secondary | ICD-10-CM | POA: Diagnosis not present

## 2017-06-15 DIAGNOSIS — M47816 Spondylosis without myelopathy or radiculopathy, lumbar region: Secondary | ICD-10-CM | POA: Diagnosis not present

## 2017-06-15 DIAGNOSIS — I482 Chronic atrial fibrillation, unspecified: Secondary | ICD-10-CM

## 2017-06-15 DIAGNOSIS — M48061 Spinal stenosis, lumbar region without neurogenic claudication: Secondary | ICD-10-CM | POA: Diagnosis not present

## 2017-06-15 DIAGNOSIS — Z5181 Encounter for therapeutic drug level monitoring: Secondary | ICD-10-CM | POA: Diagnosis not present

## 2017-06-15 LAB — POCT INR: INR: 1

## 2017-06-19 ENCOUNTER — Ambulatory Visit (INDEPENDENT_AMBULATORY_CARE_PROVIDER_SITE_OTHER): Payer: Medicare Other

## 2017-06-19 DIAGNOSIS — Z95810 Presence of automatic (implantable) cardiac defibrillator: Secondary | ICD-10-CM

## 2017-06-19 DIAGNOSIS — I5022 Chronic systolic (congestive) heart failure: Secondary | ICD-10-CM | POA: Diagnosis not present

## 2017-06-19 NOTE — Progress Notes (Signed)
EPIC Encounter for ICM Monitoring  Patient Name: [name] is a 84 y.o. male Date: 06/19/2017 Primary Care Physican: [name], MD Primary Cardiologist:[place] Electrophysiologist: Caryl Comes Dry Weight:163lbs   Heart Failure questions reviewed, pt asymptomatic.   Thoracic impedance abnormal suggesting fluid accumulation since 06/14/2017.  Prescribed dosage: Furosemide 20 mg 1 tablet as needed  Labs: 04/03/2017 Creatinine0.86, BUN9, Potassium 4.0, Sodium127, EGFR>60 01/25/2017 Creatinine0.90, BUN9, Potassium 4.3, Sodium125  01/22/2017 Creatinine0.85, BUN7, Potassium 4.0, Sodium125, EGFR>60  01/21/2017 Creatinine0.84, BUN11, Potassium4.0, Sodium124, EGFR>60  01/20/2017 Creatinine0.70, BUN14, Potassium4.5, [id number] 07/05/2016 Creatinine 0.94, BUN 14, Potassium 4.6, Sodium 127, EGFR >60 04/19/2016 Creatinine 0.9, BUN 13, Potassium 4.8, Sodium 131, EGFR 80.8 to 97.8  Recommendations:   No changes.  Encouraged to call for fluid symptoms.   Follow-up plan: ICM clinic phone appointment on 07/20/2017.  Office appointment scheduled 08/01/2017 with Dr. Caryl Comes.  Copy of ICM check sent to Dr. Caryl Comes and Dr. Burt Knack.   3 month ICM trend: 06/19/2017    1 Year ICM trend:       Rosalene Billings, RN 06/19/2017 8:30 AM

## 2017-06-20 DIAGNOSIS — M810 Age-related osteoporosis without current pathological fracture: Secondary | ICD-10-CM | POA: Diagnosis not present

## 2017-06-20 DIAGNOSIS — M47816 Spondylosis without myelopathy or radiculopathy, lumbar region: Secondary | ICD-10-CM | POA: Diagnosis not present

## 2017-06-21 ENCOUNTER — Ambulatory Visit: Payer: Self-pay | Admitting: Cardiology

## 2017-06-21 DIAGNOSIS — Z5181 Encounter for therapeutic drug level monitoring: Secondary | ICD-10-CM

## 2017-06-21 DIAGNOSIS — I482 Chronic atrial fibrillation, unspecified: Secondary | ICD-10-CM

## 2017-06-21 LAB — POCT INR: INR: 1.3

## 2017-06-21 NOTE — Patient Instructions (Signed)
Description   Spoke with pt & instructed pt to continue Lovenox injections twice a day. Today and tomorrow take 1.5 tablets then continue taking 1 tablet daily except 1.5 tablets on Wednesdays. Recheck INR on Tuesday.

## 2017-06-25 DIAGNOSIS — C61 Malignant neoplasm of prostate: Secondary | ICD-10-CM | POA: Diagnosis not present

## 2017-06-25 DIAGNOSIS — R339 Retention of urine, unspecified: Secondary | ICD-10-CM | POA: Diagnosis not present

## 2017-06-26 ENCOUNTER — Ambulatory Visit (INDEPENDENT_AMBULATORY_CARE_PROVIDER_SITE_OTHER): Payer: Medicare Other | Admitting: Cardiovascular Disease

## 2017-06-26 DIAGNOSIS — Z5181 Encounter for therapeutic drug level monitoring: Secondary | ICD-10-CM

## 2017-06-26 DIAGNOSIS — I482 Chronic atrial fibrillation, unspecified: Secondary | ICD-10-CM

## 2017-06-26 LAB — POCT INR: INR: 1.8

## 2017-06-29 ENCOUNTER — Ambulatory Visit (INDEPENDENT_AMBULATORY_CARE_PROVIDER_SITE_OTHER): Payer: Medicare Other | Admitting: Internal Medicine

## 2017-06-29 DIAGNOSIS — I482 Chronic atrial fibrillation, unspecified: Secondary | ICD-10-CM

## 2017-06-29 DIAGNOSIS — Z5181 Encounter for therapeutic drug level monitoring: Secondary | ICD-10-CM | POA: Diagnosis not present

## 2017-06-29 LAB — POCT INR: INR: 2.4

## 2017-07-05 DIAGNOSIS — Z85828 Personal history of other malignant neoplasm of skin: Secondary | ICD-10-CM | POA: Diagnosis not present

## 2017-07-05 DIAGNOSIS — L820 Inflamed seborrheic keratosis: Secondary | ICD-10-CM | POA: Diagnosis not present

## 2017-07-05 DIAGNOSIS — L821 Other seborrheic keratosis: Secondary | ICD-10-CM | POA: Diagnosis not present

## 2017-07-05 DIAGNOSIS — D692 Other nonthrombocytopenic purpura: Secondary | ICD-10-CM | POA: Diagnosis not present

## 2017-07-05 DIAGNOSIS — L570 Actinic keratosis: Secondary | ICD-10-CM | POA: Diagnosis not present

## 2017-07-06 ENCOUNTER — Ambulatory Visit (INDEPENDENT_AMBULATORY_CARE_PROVIDER_SITE_OTHER): Payer: Medicare Other | Admitting: Internal Medicine

## 2017-07-06 DIAGNOSIS — Z5181 Encounter for therapeutic drug level monitoring: Secondary | ICD-10-CM

## 2017-07-06 DIAGNOSIS — I482 Chronic atrial fibrillation, unspecified: Secondary | ICD-10-CM

## 2017-07-06 LAB — POCT INR: INR: 2

## 2017-07-06 NOTE — Patient Instructions (Signed)
Description   Spoke with pt & pt wanted me to give instruction to caregiver instructed to have pt change coumadin dose to  1 tablet daily except 1.5 tablets on Wednesdays.and Fridays  Recheck INR in 1 week

## 2017-07-11 ENCOUNTER — Encounter: Payer: Self-pay | Admitting: Internal Medicine

## 2017-07-13 ENCOUNTER — Ambulatory Visit (INDEPENDENT_AMBULATORY_CARE_PROVIDER_SITE_OTHER): Payer: Medicare Other | Admitting: Internal Medicine

## 2017-07-13 DIAGNOSIS — Z5181 Encounter for therapeutic drug level monitoring: Secondary | ICD-10-CM | POA: Diagnosis not present

## 2017-07-13 DIAGNOSIS — I482 Chronic atrial fibrillation, unspecified: Secondary | ICD-10-CM

## 2017-07-13 LAB — POCT INR: INR: 2.7

## 2017-07-13 NOTE — Patient Instructions (Signed)
Description   Spoke with pt & instructed to continue taking 1 tablet daily except 1.5 tablets on Wednesdays and Fridays  Recheck INR in 1 week

## 2017-07-20 ENCOUNTER — Ambulatory Visit (INDEPENDENT_AMBULATORY_CARE_PROVIDER_SITE_OTHER): Payer: Medicare Other

## 2017-07-20 DIAGNOSIS — I10 Essential (primary) hypertension: Secondary | ICD-10-CM | POA: Diagnosis not present

## 2017-07-20 DIAGNOSIS — I5022 Chronic systolic (congestive) heart failure: Secondary | ICD-10-CM

## 2017-07-20 DIAGNOSIS — Z95810 Presence of automatic (implantable) cardiac defibrillator: Secondary | ICD-10-CM | POA: Diagnosis not present

## 2017-07-20 DIAGNOSIS — M810 Age-related osteoporosis without current pathological fracture: Secondary | ICD-10-CM | POA: Diagnosis not present

## 2017-07-20 NOTE — Progress Notes (Signed)
EPIC Encounter for ICM Monitoring  Patient Name: [name] is a 84 y.o. male Date: 07/20/2017 Primary Care Physican: [name], MD Primary Cardiologist:[place] Electrophysiologist: Caryl Comes Dry Weight:163lbs       Heart Failure questions reviewed, pt asymptomatic.  He reported large TV fell and hit in chest area about 3 weeks ago and caused the chest to be sore but it better now. He said Dr Caryl Comes will check it 5/22.  No broken skin in the chest area.    Thoracic impedance normal.  Prescribed dosage: Furosemide 20 mg 1 tablet as needed  Labs: 04/03/2017 Creatinine0.86, BUN9, Potassium 4.0, Sodium127, EGFR>60 01/25/2017 Creatinine0.90, BUN9, Potassium 4.3, Sodium125  01/22/2017 Creatinine0.85, BUN7, Potassium 4.0, Sodium125, EGFR>60  01/21/2017 Creatinine0.84, BUN11, Potassium4.0, Sodium124, EGFR>60  01/20/2017 Creatinine0.70, BUN14, Potassium4.5, [id number] 07/05/2016 Creatinine 0.94, BUN 14, Potassium 4.6, Sodium 127, EGFR >60 04/19/2016 Creatinine 0.9, BUN 13, Potassium 4.8, Sodium 131, EGFR 80.8 to 97.8  Recommendations: No changes.  Encouraged to call for fluid symptoms.  Follow-up plan: ICM clinic phone appointment on 09/03/2017.  Office appointment scheduled 08/01/2017 with Dr. Caryl Comes and 08/09/2017 with Dr Burt Knack.  Copy of ICM check sent to Dr. Caryl Comes.   3 month ICM trend: 07/20/2017    1 Year ICM trend:       Rosalene Billings, RN 07/20/2017 9:21 AM

## 2017-07-31 DIAGNOSIS — M1711 Unilateral primary osteoarthritis, right knee: Secondary | ICD-10-CM | POA: Diagnosis not present

## 2017-08-01 ENCOUNTER — Ambulatory Visit (INDEPENDENT_AMBULATORY_CARE_PROVIDER_SITE_OTHER): Payer: Medicare Other | Admitting: *Deleted

## 2017-08-01 ENCOUNTER — Encounter: Payer: Self-pay | Admitting: Internal Medicine

## 2017-08-01 ENCOUNTER — Ambulatory Visit (INDEPENDENT_AMBULATORY_CARE_PROVIDER_SITE_OTHER): Payer: Medicare Other | Admitting: Internal Medicine

## 2017-08-01 VITALS — BP 116/64 | HR 71 | Ht 74.0 in | Wt 174.0 lb

## 2017-08-01 DIAGNOSIS — I482 Chronic atrial fibrillation, unspecified: Secondary | ICD-10-CM

## 2017-08-01 DIAGNOSIS — I255 Ischemic cardiomyopathy: Secondary | ICD-10-CM | POA: Diagnosis not present

## 2017-08-01 DIAGNOSIS — I5022 Chronic systolic (congestive) heart failure: Secondary | ICD-10-CM

## 2017-08-01 DIAGNOSIS — Z95810 Presence of automatic (implantable) cardiac defibrillator: Secondary | ICD-10-CM | POA: Diagnosis not present

## 2017-08-01 DIAGNOSIS — Z5181 Encounter for therapeutic drug level monitoring: Secondary | ICD-10-CM | POA: Diagnosis not present

## 2017-08-01 LAB — POCT INR: INR: 2.8 (ref 2.0–3.0)

## 2017-08-01 NOTE — Patient Instructions (Addendum)
Description   Continue taking 1 tablet daily except 1.5 tablets on Wednesdays and Fridays  Recheck INR in 2 weeks.

## 2017-08-01 NOTE — Patient Instructions (Signed)
Medication Instructions:  Your physician recommends that you continue on your current medications as directed. Please refer to the Current Medication list given to you today.  Labwork: None ordered.  Testing/Procedures: None ordered.  Follow-Up: Your physician wants you to follow-up in: One Year with Dr Caryl Comes. You will receive a reminder letter in the mail two months in advance. If you don't receive a letter, please call our office to schedule the follow-up appointment.  Remote monitoring is used to monitor your Pacemaker of ICD from home. This monitoring reduces the number of office visits required to check your device to one time per year. It allows [place] to keep an eye on the functioning of your device to ensure it is working properly. You are scheduled for a device check from home on 6/6 remote check. You may send your transmission at any time that day. If you have a wireless device, the transmission will be sent automatically. After your physician reviews your transmission, you will receive a postcard with your next transmission date.    Any Other Special Instructions Will Be Listed Below (If Applicable).     If you need a refill on your cardiac medications before your next appointment, please call your pharmacy.

## 2017-08-01 NOTE — Progress Notes (Signed)
Patient Care Team: [name], MD as PCP - General (Internal Medicine) Sherren Mocha, MD as PCP - Cardiology (Cardiology)   HPI  [name] is a 84 y.o. male seen in followup for ischemic cardiomyopathy for which he underwent ICD implantation for primary prevention. He has had appropriate therapy for ventricular tachycardia. He underwent generator change 12/14 He is status post bypass surgery.  S/P TAVR  LVEDD increased and ARB added 9/15; he was unable to tolerate this.   He also has a history of atrial fibrillation for which amiodarone was initiated. He was intolerant of this and this has been discontinued. His atrial fibrillation is now permanent. He is on warfarin    Has struggles with constipation  Have called his PCP   This is the only time he is sob  No chest pain or edema    Past Medical History:  Diagnosis Date  . AF (atrial fibrillation) ([place])    Has not tolerated amiodarone in the past. Amiodarone was stopped in September of 2010 due to side effects  . Aortic stenosis, severe    WITH PERCUTANEOUS AORTIC VALVE (TAVI) IN March 2012 at the [name]  . Cervical myelopathy ([place])   . CHF (congestive heart failure) ([place])   . Chronic anticoagulation    on coumadin  . Coronary artery disease   . Diverticulitis    CURRENTLY CONTROLLED WITH NO EVIDENCE OF RECURRENT INFECTION  . Esophageal stricture    WITH DILATATION  . GERD (gastroesophageal reflux disease)   . Heart murmur   . Hyperlipidemia   . Ischemic cardiomyopathy 05/2010   Has EF of 25%  . MI (myocardial infarction) ([place]) 1974, 1978  . NSVT (nonsustained ventricular tachycardia) ([place])   . Osteoarthritis    RIGHT KNEE  . Osteoporosis   . Prostate cancer ([place])   . Pulmonary embolism ([place])   . Stroke [facility])    3 strokes    Past Surgical History:  Procedure Laterality Date  . AORTIC VALVE REPLACEMENT     Percutaneous AVR in March 2012 at the [facility]  . BACK SURGERY    . CARDIAC  CATHETERIZATION  2010   SEVERE LV DYSFUNCTION WITH ESTIMATED EJECTION FRACTION OF 25%  . CARDIOVERSION  02/16/2011   Procedure: CARDIOVERSION;  Surgeon: Carlena Bjornstad, MD;  Location: [facility];  Service: Cardiovascular;  Laterality: N/A;  . CHOLECYSTECTOMY    . CORONARY ARTERY BYPASS GRAFT  1979  . CORONARY ARTERY BYPASS GRAFT  1991   REDO SURGERY  . ICD  Feb 2003; 02/2013   gen change 02-11-2013 by Dr Caryl Comes  . IMPLANTABLE CARDIOVERTER DEFIBRILLATOR (ICD) GENERATOR CHANGE N/A 02/10/2013   Procedure: ICD GENERATOR CHANGE;  Surgeon: Deboraha Sprang, MD;  Location: [facility] CATH LAB;  Service: Cardiovascular;  Laterality: N/A;  . SHOULDER SURGERY      Current Outpatient Medications  Medication Sig Dispense Refill  . acetaminophen (TYLENOL) 500 MG tablet Take 1,000 mg by mouth every 6 (six) hours as needed for mild pain.     Marland Kitchen albuterol (PROVENTIL HFA;VENTOLIN HFA) 108 (90 Base) MCG/ACT inhaler Inhale 1-2 puffs into the lungs every 6 (six) hours as needed for wheezing or shortness of breath.    . alfuzosin (UROXATRAL) 10 MG 24 hr tablet Take 10 mg by mouth at bedtime.     Marland Kitchen aspirin 81 MG tablet Take 81 mg by mouth daily.    Marland Kitchen atorvastatin (LIPITOR) 10 MG tablet Take 10 mg by mouth every other day.    Marland Kitchen  benzonatate (TESSALON) 100 MG capsule Take 100 mg by mouth 3 (three) times daily as needed for cough.    . Budesonide-Formoterol Fumarate (SYMBICORT IN) Inhale 1 puff into the lungs 2 (two) times daily.    . carvedilol (COREG) 6.25 MG tablet TAKE 1 TABLET BY MOUTH TWICE DAILY WITH A MEAL. 180 tablet 2  . cholecalciferol (VITAMIN D) 1000 UNITS tablet Take 1,000 Units by mouth 2 (two) times daily.    Marland Kitchen COUMADIN 5 MG tablet TAKE AS DIRECTED BY COUMADIN CLINIC. 100 tablet 1  . dicyclomine (BENTYL) 20 MG tablet Take 1 tablet (20 mg total) by mouth 2 (two) times daily as needed for spasms. 20 tablet 0  . enoxaparin (LOVENOX) 80 MG/0.8ML injection Inject 0.8 mLs (80 mg total) into the skin every 12 (twelve) hours.  20 Syringe 1  . furosemide (LASIX) 20 MG tablet TAKE (1) TABLET DAILY AS NEEDED FOR SWELLING. 30 tablet 0  . ipratropium (ATROVENT) 0.03 % nasal spray Place 2 sprays into both nostrils 2 (two) times daily.    Marland Kitchen lactulose (CEPHULAC) 10 g packet Take 1 packet (10 g total) daily as needed by mouth (for severe constipation). 15 each 0  . levofloxacin (LEVAQUIN) 250 MG tablet Take 1 tablet (250 mg total) at bedtime by mouth. 7 tablet 0  . levothyroxine (SYNTHROID) 125 MCG tablet Take 1 tablet (125 mcg total) by mouth daily before breakfast. 30 tablet 0  . linaclotide (LINZESS) 145 MCG CAPS capsule Take 145 mcg by mouth daily before breakfast.    . nitroGLYCERIN (NITROSTAT) 0.4 MG SL tablet Place 1 tablet (0.4 mg total) under the tongue every 5 (five) minutes x 3 doses as needed for chest pain (Max 3 doses in 15 min. Call 911). 25 tablet 1  . omeprazole (PRILOSEC) 20 MG capsule Take 20 mg by mouth daily as needed (acid reflux).    . polyethylene glycol (MIRALAX / GLYCOLAX) packet Take 17 g by mouth daily as needed for mild constipation.     . Probiotic Product (PROBIOTIC DAILY PO) Take 1 capsule by mouth daily.    . RESTASIS 0.05 % ophthalmic emulsion Place 1 drop into both eyes 2 (two) times daily as needed (dry eyes).     . traMADol (ULTRAM) 50 MG tablet Take 50 mg by mouth every 6 (six) hours as needed for moderate pain.     No current facility-administered medications for this visit.     Allergies  Allergen Reactions  . Penicillins Shortness Of Breath and Rash    Has patient had a PCN reaction causing immediate rash, facial/tongue/throat swelling, SOB or lightheadedness with hypotension: Yes Has patient had a PCN reaction causing severe rash involving mucus membranes or skin necrosis: No Has patient had a PCN reaction that required hospitalization No Has patient had a PCN reaction occurring within the last 10 years: No If all of the above answers are "NO", then may proceed with Cephalosporin  use.   . Tizanidine Shortness Of Breath    Light headed  . Ace Inhibitors     Has not tolerated in the past due to hyperkalemia  . Antihistamines, Diphenhydramine-Type Other (See Comments)    Inhibits urination  . Clemastine Fumarate Other (See Comments)    Inhibits urination  . Dimenhydrinate Other (See Comments)    Inhibits urination  . Amiodarone Other (See Comments)    Unknown     Review of Systems negative except from HPI and PMH  Physical Exam BP 116/64   Pulse  71   Ht 6\' 2"  (1.88 m)   Wt 174 lb (78.9 kg)   SpO2 98%   BMI 22.34 kg/m  Well developed and very thin male in no acute distress HENT normal Neck supple with JVP-flat Carotids brisk and full without bruits Clear Irregularly irregular rate and rhythm with controlled ventricular response, no murmurs or gallops Abd-soft with active BS without hepatomegaly No Clubbing cyanosis edema Skin-warm and dry A & Oriented  Grossly normal sensory and motor function walks with walker     ECG AFib 79  -/11/39  Assessment and  Plan  Atrial fibrillation-permanent  Congestive heart failure-chronic-systolic  Coronary Artery Disease, s/ p CABG  Ischemic Cardiomyopathy With Implantable defibrillator-[employer]  TAVR  Constipation   Euvolemic continue current meds  Without symptoms of ischemia  On Anticoagulation;  No bleeding issues   Called his PCP who will review in am  We spent more than 50% of our >25 min visit in face to face counseling regarding the above

## 2017-08-02 NOTE — Addendum Note (Signed)
Addended by: Campbell Riches on: 08/02/2017 10:54 AM   Modules accepted: Orders

## 2017-08-07 DIAGNOSIS — M1711 Unilateral primary osteoarthritis, right knee: Secondary | ICD-10-CM | POA: Diagnosis not present

## 2017-08-07 LAB — CUP PACEART INCLINIC DEVICE CHECK
Battery Remaining Longevity: 96 mo
Brady Statistic RV Percent Paced: 3.08 %
HighPow Impedance: 44 Ohm
HighPow Impedance: 56 Ohm
Implantable Lead Location: 753860
Implantable Lead Model: 6944
Lead Channel Sensing Intrinsic Amplitude: 18.75 mV
Lead Channel Setting Pacing Amplitude: 2 V
Lead Channel Setting Pacing Pulse Width: 0.4 ms
MDC IDC LEAD IMPLANT DT: 20021220
MDC IDC MSMT BATTERY VOLTAGE: 3.01 V
MDC IDC MSMT LEADCHNL RV IMPEDANCE VALUE: 456 Ohm
MDC IDC MSMT LEADCHNL RV IMPEDANCE VALUE: 589 Ohm
MDC IDC MSMT LEADCHNL RV PACING THRESHOLD AMPLITUDE: 1 V
MDC IDC MSMT LEADCHNL RV PACING THRESHOLD PULSEWIDTH: 0.4 ms
MDC IDC PG IMPLANT DT: 20141201
MDC IDC SESS DTM: 20190522212019
MDC IDC SET LEADCHNL RV SENSING SENSITIVITY: 0.3 mV

## 2017-08-10 ENCOUNTER — Ambulatory Visit (INDEPENDENT_AMBULATORY_CARE_PROVIDER_SITE_OTHER): Payer: Medicare Other | Admitting: Cardiovascular Disease

## 2017-08-10 ENCOUNTER — Encounter: Payer: Self-pay | Admitting: Cardiovascular Disease

## 2017-08-10 VITALS — BP 122/68 | HR 69 | Ht 74.0 in | Wt 175.0 lb

## 2017-08-10 DIAGNOSIS — I482 Chronic atrial fibrillation, unspecified: Secondary | ICD-10-CM

## 2017-08-10 DIAGNOSIS — H2513 Age-related nuclear cataract, bilateral: Secondary | ICD-10-CM | POA: Diagnosis not present

## 2017-08-10 DIAGNOSIS — I255 Ischemic cardiomyopathy: Secondary | ICD-10-CM | POA: Diagnosis not present

## 2017-08-10 DIAGNOSIS — I5022 Chronic systolic (congestive) heart failure: Secondary | ICD-10-CM

## 2017-08-10 NOTE — Patient Instructions (Signed)
Medication Instructions:  Your provider recommends that you continue on your current medications as directed. Please refer to the Current Medication list given to you today.    Labwork: None  Testing/Procedures: Your provider has requested that you have an echocardiogram in October. Echocardiography is a painless test that uses sound waves to create images of your heart. It provides your doctor with information about the size and shape of your heart and how well your heart's chambers and valves are working. This procedure takes approximately one hour. There are no restrictions for this procedure.  Follow-Up: Your provider wants you to follow-up in: October with Dr. Burt Knack. You will receive a reminder letter in the mail two months in advance. If you don't receive a letter, please call our office to schedule the follow-up appointment.    Any Other Special Instructions Will Be Listed Below (If Applicable).     If you need a refill on your cardiac medications before your next appointment, please call your pharmacy.

## 2017-08-10 NOTE — Progress Notes (Signed)
Cardiology Office Note Date:  08/10/2017   ID:  [name], DOB 1932-04-29, MRN [id number]  PCP:  [name], MD  Cardiologist:  Sherren Mocha, MD    Chief Complaint  Patient presents with  . Congestive Heart Failure     History of Present Illness: [name] is a 84 y.o. male who presents for follow-up evaluation.   He has coronary artery disease with initial CABG surgery in 1979 and redo CABG in 1991. He's had a long-standing severe ischemic cardiomyopathy with LVEF less than 30%. The patient has undergone ICD implantation. He underwent TAVR the [facility] in 2012 and he hasmild-moderate paravalvular aortic insufficiency which has been followed with serial echo studies. He's developed permanent atrial fibrillation and is maintained on long-term anticoagulation. He's not been able to tolerate medications for heart failure because of low BP and frailty.   The patient is here with his wife and caregiver today.  He reports no changes from a cardiac perspective.  Feels like his breathing is doing pretty well.  His family notes that he has some wheezing at times.  He remains limited by low back pain and urinary problems.  He has a poor appetite and is lost a good bit of weight.  He is been working at maintaining his weight over the last 6 to 8 months and is doing better with this.  He denies orthopnea, PND, leg swelling, or chest pain.  Past Medical History:  Diagnosis Date  . AF (atrial fibrillation) ([place])    Has not tolerated amiodarone in the past. Amiodarone was stopped in September of 2010 due to side effects  . Aortic stenosis, severe    WITH PERCUTANEOUS AORTIC VALVE (TAVI) IN March 2012 at the [facility]  . Cervical myelopathy ([place])   . CHF (congestive heart failure) ([place])   . Chronic anticoagulation    on coumadin  . Coronary artery disease   . Diverticulitis    CURRENTLY CONTROLLED WITH NO EVIDENCE OF RECURRENT INFECTION  . Esophageal stricture    WITH  DILATATION  . GERD (gastroesophageal reflux disease)   . Heart murmur   . Hyperlipidemia   . Ischemic cardiomyopathy 05/2010   Has EF of 25%  . MI (myocardial infarction) ([place]) 1974, 1978  . NSVT (nonsustained ventricular tachycardia) ([place])   . Osteoarthritis    RIGHT KNEE  . Osteoporosis   . Prostate cancer ([employer])   . Pulmonary embolism ([place])   . Stroke [facility]    3 strokes    Past Surgical History:  Procedure Laterality Date  . AORTIC VALVE REPLACEMENT     Percutaneous AVR in March 2012 at the [facility]  . BACK SURGERY    . CARDIAC CATHETERIZATION  2010   SEVERE LV DYSFUNCTION WITH ESTIMATED EJECTION FRACTION OF 25%  . CARDIOVERSION  02/16/2011   Procedure: CARDIOVERSION;  Surgeon: Carlena Bjornstad, MD;  Location: [place];  Service: Cardiovascular;  Laterality: N/A;  . CHOLECYSTECTOMY    . CORONARY ARTERY BYPASS GRAFT  1979  . CORONARY ARTERY BYPASS GRAFT  1991   REDO SURGERY  . ICD  Feb 2003; 02/2013   gen change 02-11-2013 by Dr Caryl Comes  . IMPLANTABLE CARDIOVERTER DEFIBRILLATOR (ICD) GENERATOR CHANGE N/A 02/10/2013   Procedure: ICD GENERATOR CHANGE;  Surgeon: Deboraha Sprang, MD;  Location: [facility] C[facility] LAB;  Service: Cardiovascular;  Laterality: N/A;  . SHOULDER SURGERY      Current Outpatient Medications  Medication Sig Dispense Refill  . acetaminophen (  TYLENOL) 500 MG tablet Take 1,000 mg by mouth every 6 (six) hours as needed for mild pain.     Marland Kitchen albuterol (PROVENTIL HFA;VENTOLIN HFA) 108 (90 Base) MCG/ACT inhaler Inhale 1-2 puffs into the lungs every 6 (six) hours as needed for wheezing or shortness of breath.    . alfuzosin (UROXATRAL) 10 MG 24 hr tablet Take 10 mg by mouth at bedtime.     Marland Kitchen aspirin 81 MG tablet Take 81 mg by mouth daily.    Marland Kitchen atorvastatin (LIPITOR) 10 MG tablet Take 10 mg by mouth every other day.    . benzonatate (TESSALON) 100 MG capsule Take 100 mg by mouth 3 (three) times daily as needed for cough.    . Budesonide-Formoterol Fumarate (SYMBICORT IN)  Inhale 1 puff into the lungs 2 (two) times daily.    . carvedilol (COREG) 6.25 MG tablet TAKE 1 TABLET BY MOUTH TWICE DAILY WITH A MEAL. 180 tablet 2  . cholecalciferol (VITAMIN D) 1000 UNITS tablet Take 1,000 Units by mouth 2 (two) times daily.    Marland Kitchen COUMADIN 5 MG tablet TAKE AS DIRECTED BY COUMADIN CLINIC. 100 tablet 1  . dicyclomine (BENTYL) 20 MG tablet Take 1 tablet (20 mg total) by mouth 2 (two) times daily as needed for spasms. 20 tablet 0  . furosemide (LASIX) 20 MG tablet TAKE (1) TABLET DAILY AS NEEDED FOR SWELLING. 30 tablet 0  . ipratropium (ATROVENT) 0.03 % nasal spray Place 2 sprays into both nostrils 2 (two) times daily.    Marland Kitchen lactulose (CEPHULAC) 10 g packet Take 1 packet (10 g total) daily as needed by mouth (for severe constipation). 15 each 0  . levothyroxine (SYNTHROID) 125 MCG tablet Take 1 tablet (125 mcg total) by mouth daily before breakfast. 30 tablet 0  . linaclotide (LINZESS) 145 MCG CAPS capsule Take 145 mcg by mouth daily before breakfast.    . nitroGLYCERIN (NITROSTAT) 0.4 MG SL tablet Place 1 tablet (0.4 mg total) under the tongue every 5 (five) minutes x 3 doses as needed for chest pain (Max 3 doses in 15 min. Call 911). 25 tablet 1  . omeprazole (PRILOSEC) 20 MG capsule Take 20 mg by mouth daily as needed (acid reflux).    . polyethylene glycol (MIRALAX / GLYCOLAX) packet Take 17 g by mouth daily as needed for mild constipation.     . Probiotic Product (PROBIOTIC DAILY PO) Take 1 capsule by mouth daily.    . RESTASIS 0.05 % ophthalmic emulsion Place 1 drop into both eyes 2 (two) times daily as needed (dry eyes).     . traMADol (ULTRAM) 50 MG tablet Take 50 mg by mouth every 6 (six) hours as needed for moderate pain.     No current facility-administered medications for this visit.     Allergies:   Penicillins; Tizanidine; Ace inhibitors; Antihistamines, diphenhydramine-type; Clemastine fumarate; Dimenhydrinate; and Amiodarone   Social History:  The patient  reports  that he quit smoking about 48 years ago. His smoking use included cigarettes. He has a 60.00 pack-year smoking history. He has never used smokeless tobacco. He reports that he does not drink alcohol or use drugs.   Family History:  The patient's family history includes Diabetes (age of onset: 68) in his mother; Heart disease (age of onset: 69) in his mother; Heart disease (age of onset: 63) in his father.    ROS:  Please see the history of present illness.  Otherwise, review of systems is positive for weight loss, poor  appetite, exertional dyspnea, depression, nausea, anxiety, balance problems.  All other systems are reviewed and negative.    PHYSICAL EXAM: VS:  BP 122/68   Pulse 69   Ht 6\' 2"  (1.88 m)   Wt 175 lb (79.4 kg)   SpO2 97%   BMI 22.47 kg/m  , BMI Body mass index is 22.47 kg/m. GEN: Elderly thin male, in no acute distress  HEENT: normal  Neck: no JVD, no masses. No carotid bruits Cardiac: RRR with 2/6 SEM at the RUSB, no diastolic murmur      Respiratory:  clear to auscultation bilaterally, normal work of breathing GI: soft, nontender, nondistended, + BS MS: no deformity or atrophy  Ext: no pretibial edema, pedal pulses 2+= bilaterally Skin: warm and dry, no rash Neuro:  Strength and sensation are intact Psych: euthymic mood, full affect  EKG:  EKG is not ordered today.  Recent Labs: 01/21/2017: TSH 2.815 04/03/2017: ALT 11; BUN 9; Creatinine, Ser 0.86; Hemoglobin 12.4; Platelets 171; Potassium 4.0; Sodium 127   Lipid Panel     Component Value Date/Time   CHOL 142 02/01/2016 1111   TRIG 72 02/01/2016 1111   HDL 51 02/01/2016 1111   CHOLHDL 2.8 02/01/2016 1111   VLDL 14 02/01/2016 1111   LDLCALC 77 02/01/2016 1111      Wt Readings from Last 3 Encounters:  08/10/17 175 lb (79.4 kg)  08/01/17 174 lb (78.9 kg)  04/02/17 162 lb (73.5 kg)     Cardiac Studies Reviewed: Echo 11/20/2016: Study Conclusions  - Left ventricle: The cavity size was normal. There  was mild focal   basal hypertrophy of the septum. Systolic function was severely   reduced. The estimated ejection fraction was in the range of 20%   to 25%. Diffuse hypokinesis. Akinesis of the   mid-apicalinferoseptal and apical myocardium. The study is not   technically sufficient to allow evaluation of LV diastolic   function. - Aortic valve: A TAVR bioprosthesis was present. Transvalvular   velocity was within the normal range. There was no stenosis.   There was mild regurgitation. - Aorta: Ascending aortic diameter: 37 mm (S). - Ascending aorta: The ascending aorta was mildly dilated. - Mitral valve: Transvalvular velocity was within the normal range.   There was no evidence for stenosis. There was trivial   regurgitation. - Left atrium: The atrium was severely dilated. - Right ventricle: The cavity size was normal. Wall thickness was   normal. Systolic function was normal. - Tricuspid valve: There was mild regurgitation. - Pulmonary arteries: Systolic pressure could not be accurately   estimated.  ASSESSMENT AND PLAN: 1.  Chronic systolic heart failure, near Heart Association functional class II symptoms.  Patient has a severe underlying ischemic cardiomyopathy.  He is been remarkably stable with his cardiac condition over the past several years.  He will continue on his current medical program which includes carvedilol.  He has not been able to tolerate ACE/ARB or other heart failure medicines due to low blood pressure.  No clinical signs of volume overload.  2.  Coronary artery disease, native vessel: No anginal symptoms.    3.  Aortic valve disease status post TAVR: Plan repeat echo prior to his next office visit.  Exam is stable.  4.  Permanent atrial fibrillation: Continues to tolerate warfarin without significant bleeding problems.  Current medicines are reviewed with the patient today.  The patient does not have concerns regarding medicines.  Labs/ tests ordered today  include:   Orders  Placed This Encounter  Procedures  . ECHOCARDIOGRAM COMPLETE    Disposition:   FU 6 months with an echo  Signed, Sherren Mocha, MD  08/10/2017 4:20 PM    [facility] [place], [place], [place]  [address] Phone: [phone]; Fax: [phone]

## 2017-08-13 ENCOUNTER — Ambulatory Visit: Payer: Medicare Other | Admitting: Cardiovascular Disease

## 2017-08-14 DIAGNOSIS — M1711 Unilateral primary osteoarthritis, right knee: Secondary | ICD-10-CM | POA: Diagnosis not present

## 2017-08-16 ENCOUNTER — Ambulatory Visit (INDEPENDENT_AMBULATORY_CARE_PROVIDER_SITE_OTHER): Payer: Medicare Other | Admitting: Internal Medicine

## 2017-08-16 ENCOUNTER — Ambulatory Visit (INDEPENDENT_AMBULATORY_CARE_PROVIDER_SITE_OTHER): Payer: Medicare Other | Admitting: *Deleted

## 2017-08-16 DIAGNOSIS — Z952 Presence of prosthetic heart valve: Secondary | ICD-10-CM

## 2017-08-16 DIAGNOSIS — I255 Ischemic cardiomyopathy: Secondary | ICD-10-CM

## 2017-08-16 DIAGNOSIS — I482 Chronic atrial fibrillation, unspecified: Secondary | ICD-10-CM

## 2017-08-16 DIAGNOSIS — Z5181 Encounter for therapeutic drug level monitoring: Secondary | ICD-10-CM | POA: Diagnosis not present

## 2017-08-16 LAB — POCT INR: INR: 2.1 (ref 2.0–3.0)

## 2017-08-16 NOTE — Patient Instructions (Signed)
Description   Today June 6th take 1.5 tablets then continue taking 1 tablet daily except 1.5 tablets on Wednesdays and Fridays  Recheck INR in 2 weeks.

## 2017-08-16 NOTE — Progress Notes (Signed)
Remote ICD transmission.   

## 2017-08-17 ENCOUNTER — Ambulatory Visit: Payer: Medicare Other | Admitting: Cardiovascular Disease

## 2017-08-21 DIAGNOSIS — M1711 Unilateral primary osteoarthritis, right knee: Secondary | ICD-10-CM | POA: Diagnosis not present

## 2017-08-24 DIAGNOSIS — E871 Hypo-osmolality and hyponatremia: Secondary | ICD-10-CM | POA: Diagnosis not present

## 2017-08-24 DIAGNOSIS — R338 Other retention of urine: Secondary | ICD-10-CM | POA: Diagnosis not present

## 2017-08-24 DIAGNOSIS — M1990 Unspecified osteoarthritis, unspecified site: Secondary | ICD-10-CM | POA: Diagnosis not present

## 2017-08-24 DIAGNOSIS — D692 Other nonthrombocytopenic purpura: Secondary | ICD-10-CM | POA: Diagnosis not present

## 2017-08-24 DIAGNOSIS — K5909 Other constipation: Secondary | ICD-10-CM | POA: Diagnosis not present

## 2017-08-24 DIAGNOSIS — Z7901 Long term (current) use of anticoagulants: Secondary | ICD-10-CM | POA: Diagnosis not present

## 2017-08-24 DIAGNOSIS — J449 Chronic obstructive pulmonary disease, unspecified: Secondary | ICD-10-CM | POA: Diagnosis not present

## 2017-08-24 DIAGNOSIS — M810 Age-related osteoporosis without current pathological fracture: Secondary | ICD-10-CM | POA: Diagnosis not present

## 2017-08-24 DIAGNOSIS — Z952 Presence of prosthetic heart valve: Secondary | ICD-10-CM | POA: Diagnosis not present

## 2017-08-24 DIAGNOSIS — E46 Unspecified protein-calorie malnutrition: Secondary | ICD-10-CM | POA: Diagnosis not present

## 2017-08-24 DIAGNOSIS — Z6824 Body mass index (BMI) 24.0-24.9, adult: Secondary | ICD-10-CM | POA: Diagnosis not present

## 2017-08-24 DIAGNOSIS — F418 Other specified anxiety disorders: Secondary | ICD-10-CM | POA: Diagnosis not present

## 2017-09-03 ENCOUNTER — Ambulatory Visit (INDEPENDENT_AMBULATORY_CARE_PROVIDER_SITE_OTHER): Payer: Medicare Other

## 2017-09-03 DIAGNOSIS — Z95810 Presence of automatic (implantable) cardiac defibrillator: Secondary | ICD-10-CM | POA: Diagnosis not present

## 2017-09-03 DIAGNOSIS — I5022 Chronic systolic (congestive) heart failure: Secondary | ICD-10-CM | POA: Diagnosis not present

## 2017-09-04 ENCOUNTER — Ambulatory Visit (INDEPENDENT_AMBULATORY_CARE_PROVIDER_SITE_OTHER): Payer: Medicare Other | Admitting: Cardiology

## 2017-09-04 DIAGNOSIS — I482 Chronic atrial fibrillation, unspecified: Secondary | ICD-10-CM

## 2017-09-04 DIAGNOSIS — M1711 Unilateral primary osteoarthritis, right knee: Secondary | ICD-10-CM | POA: Diagnosis not present

## 2017-09-04 DIAGNOSIS — Z5181 Encounter for therapeutic drug level monitoring: Secondary | ICD-10-CM

## 2017-09-04 DIAGNOSIS — Z7901 Long term (current) use of anticoagulants: Secondary | ICD-10-CM | POA: Diagnosis not present

## 2017-09-04 LAB — POCT INR: INR: 3.1 — AB (ref 2.0–3.0)

## 2017-09-04 NOTE — Progress Notes (Signed)
EPIC Encounter for ICM Monitoring  Patient Name: [name] is a 84 y.o. male Date: 09/04/2017 Primary Care Physican: [name], MD Primary Cardiologist:Cooper Electrophysiologist: Faustino Congress Weight:172lbs (at last office visit)      Heart Failure questions reviewed, pt asymptomatic.   Thoracic impedance normal.  Prescribed dosage: Furosemide 20 mg 1 tablet as needed  Labs: 04/03/2017 Creatinine0.86, BUN9, Potassium 4.0, Sodium127, EGFR>60 01/25/2017 Creatinine0.90, BUN9, Potassium 4.3, Sodium125  01/22/2017 Creatinine0.85, BUN7, Potassium 4.0, Sodium125, EGFR>60  01/21/2017 Creatinine0.84, BUN11, Potassium4.0, Sodium124, EGFR>60  01/20/2017 Creatinine0.70, BUN14, Potassium4.5, [id number] 07/05/2016 Creatinine 0.94, BUN 14, Potassium 4.6, Sodium 127, EGFR >60 04/19/2016 Creatinine 0.9, BUN 13, Potassium 4.8, Sodium 131, EGFR 80.8 to 97.8  Recommendations: No changes.   Encouraged to call for fluid symptoms.  Follow-up plan: ICM clinic phone appointment on 10/09/2017.    Copy of ICM check sent to Dr. Caryl Comes.   3 month ICM trend: 09/03/2017    1 Year ICM trend:       Rosalene Billings, RN 09/04/2017 12:11 PM

## 2017-09-10 ENCOUNTER — Ambulatory Visit: Payer: Medicare Other | Admitting: Cardiovascular Disease

## 2017-09-10 ENCOUNTER — Other Ambulatory Visit: Payer: Self-pay | Admitting: Cardiovascular Disease

## 2017-09-10 ENCOUNTER — Encounter

## 2017-09-11 DIAGNOSIS — M1711 Unilateral primary osteoarthritis, right knee: Secondary | ICD-10-CM | POA: Diagnosis not present

## 2017-09-11 DIAGNOSIS — Z6822 Body mass index (BMI) 22.0-22.9, adult: Secondary | ICD-10-CM | POA: Diagnosis not present

## 2017-09-12 DIAGNOSIS — I739 Peripheral vascular disease, unspecified: Secondary | ICD-10-CM | POA: Diagnosis not present

## 2017-09-12 DIAGNOSIS — B351 Tinea unguium: Secondary | ICD-10-CM | POA: Diagnosis not present

## 2017-09-18 ENCOUNTER — Ambulatory Visit (INDEPENDENT_AMBULATORY_CARE_PROVIDER_SITE_OTHER): Payer: Medicare Other | Admitting: Internal Medicine

## 2017-09-18 DIAGNOSIS — I482 Chronic atrial fibrillation, unspecified: Secondary | ICD-10-CM

## 2017-09-18 DIAGNOSIS — Z5181 Encounter for therapeutic drug level monitoring: Secondary | ICD-10-CM

## 2017-09-18 DIAGNOSIS — Z952 Presence of prosthetic heart valve: Secondary | ICD-10-CM

## 2017-09-18 LAB — POCT INR: INR: 2.8 (ref 2.0–3.0)

## 2017-09-18 NOTE — Patient Instructions (Signed)
Description   Spoke with pt and instructed him to continue taking 1 tablet daily except 1.5 tablets on Wednesdays and Fridays.  Recheck INR in 2 weeks.      

## 2017-10-03 ENCOUNTER — Ambulatory Visit (INDEPENDENT_AMBULATORY_CARE_PROVIDER_SITE_OTHER): Payer: Medicare Other | Admitting: Interventional Cardiology

## 2017-10-03 DIAGNOSIS — Z5181 Encounter for therapeutic drug level monitoring: Secondary | ICD-10-CM | POA: Diagnosis not present

## 2017-10-03 DIAGNOSIS — I482 Chronic atrial fibrillation, unspecified: Secondary | ICD-10-CM

## 2017-10-03 LAB — CUP PACEART REMOTE DEVICE CHECK
Battery Remaining Longevity: 95 mo
Brady Statistic RV Percent Paced: 4.21 %
HIGH POWER IMPEDANCE MEASURED VALUE: 46 Ohm
HighPow Impedance: 57 Ohm
Implantable Lead Implant Date: 20021220
Lead Channel Impedance Value: 456 Ohm
Lead Channel Pacing Threshold Amplitude: 0.875 V
Lead Channel Sensing Intrinsic Amplitude: 17.25 mV
Lead Channel Setting Pacing Amplitude: 2 V
Lead Channel Setting Sensing Sensitivity: 0.3 mV
MDC IDC LEAD LOCATION: 753860
MDC IDC MSMT BATTERY VOLTAGE: 3.01 V
MDC IDC MSMT LEADCHNL RV IMPEDANCE VALUE: 608 Ohm
MDC IDC MSMT LEADCHNL RV PACING THRESHOLD PULSEWIDTH: 0.4 ms
MDC IDC MSMT LEADCHNL RV SENSING INTR AMPL: 17.25 mV
MDC IDC PG IMPLANT DT: 20141201
MDC IDC SESS DTM: 20190606083824
MDC IDC SET LEADCHNL RV PACING PULSEWIDTH: 0.4 ms

## 2017-10-03 LAB — POCT INR: INR: 2.3 (ref 2.0–3.0)

## 2017-10-03 NOTE — Patient Instructions (Signed)
Description   Spoke with pt and instructed him to take 1.5 tablets tomorrow, then continue taking 1 tablet daily except 1.5 tablets on Wednesdays and Fridays.  Recheck INR in 2 weeks.

## 2017-10-09 ENCOUNTER — Telehealth: Payer: Self-pay | Admitting: Internal Medicine

## 2017-10-09 ENCOUNTER — Ambulatory Visit (INDEPENDENT_AMBULATORY_CARE_PROVIDER_SITE_OTHER): Payer: Medicare Other

## 2017-10-09 DIAGNOSIS — I5022 Chronic systolic (congestive) heart failure: Secondary | ICD-10-CM

## 2017-10-09 DIAGNOSIS — Z95810 Presence of automatic (implantable) cardiac defibrillator: Secondary | ICD-10-CM

## 2017-10-09 NOTE — Telephone Encounter (Signed)
Returned patient call.  See ICM note from today.

## 2017-10-09 NOTE — Progress Notes (Signed)
EPIC Encounter for ICM Monitoring  Patient Name: [name] is a 84 y.o. male Date: 10/09/2017 Primary Care Physican: [name], MD Primary Cardiologist:Cooper Electrophysiologist: Caryl Comes Dry Weight:172lbs       Heart Failure questions reviewed, pt asymptomatic.   Thoracic impedance abnormal suggesting fluid accumulation starting 09/21/2017.  Prescribed dosage: Furosemide 20 mg 1 tablet as needed  Labs: 04/03/2017 Creatinine0.86, BUN9, Potassium 4.0, Sodium127, EGFR>60 01/25/2017 Creatinine0.90, BUN9, Potassium 4.3, Sodium125  01/22/2017 Creatinine0.85, BUN7, Potassium 4.0, Sodium125, EGFR>60  01/21/2017 Creatinine0.84, BUN11, Potassium4.0, Sodium124, EGFR>60  01/20/2017 Creatinine0.70, BUN14, Potassium4.5, [id number] 07/05/2016 Creatinine 0.94, BUN 14, Potassium 4.6, Sodium 127, EGFR >60 04/19/2016 Creatinine 0.9, BUN 13, Potassium 4.8, Sodium 131, EGFR 80.8 to 97.8  Recommendations: Advised to take PRN Furosemide 20 mg 1 tablet x 2 days.   Follow-up plan: ICM clinic phone appointment on 10/12/2017 to recheck fluid levels (manual send)    Copy of ICM check sent to Dr. Caryl Comes.   3 month ICM trend: 10/09/2017    1 Year ICM trend:       Rosalene Billings, RN 10/09/2017 12:04 PM

## 2017-10-09 NOTE — Progress Notes (Signed)
Returned patient call and he preferred I speak with wife.  He did not understand the directions given with taking Furosemide.  Wife stated she is very concerned with his condition and he is getting weaker. Reviewed fluid symptoms and patient does not have any symptoms at this time.  Due to wifes concern regarding weakness, advised her not to give Furosemide at this time unless patient experiences any fluid symptoms. Encouraged to limit total fluid intake to 64 oz a day.  Will recheck fluid levels on 10/12/2017.  She verbalized understanding.

## 2017-10-09 NOTE — Telephone Encounter (Signed)
New Message: ° ° ° ° ° ° °Pt is returning a call °

## 2017-10-15 ENCOUNTER — Telehealth: Payer: Self-pay

## 2017-10-15 ENCOUNTER — Ambulatory Visit (INDEPENDENT_AMBULATORY_CARE_PROVIDER_SITE_OTHER): Payer: Self-pay

## 2017-10-15 DIAGNOSIS — Z95810 Presence of automatic (implantable) cardiac defibrillator: Secondary | ICD-10-CM

## 2017-10-15 DIAGNOSIS — I5022 Chronic systolic (congestive) heart failure: Secondary | ICD-10-CM

## 2017-10-15 NOTE — Progress Notes (Signed)
EPIC Encounter for ICM Monitoring  Patient Name: [name] is a 84 y.o. male Date: 10/15/2017 Primary Care Physican: [name], MD Primary Cardiologist:Cooper Electrophysiologist: Caryl Comes Dry Weight:164.2lbs                                                   Heart Failure questions reviewed, pt asymptomatic.  Weight dropped from 172 lbs to 164.2 pounds after taking 1 PRN Furosemide.    Thoracic impedance trending back to baseline and improved after taking PRN Furosemide at patient's discretion.  Prescribed dosage: Furosemide 20 mg 1 tablet as needed  Labs: 04/03/2017 Creatinine0.86, BUN9, Potassium 4.0, Sodium127, EGFR>60 01/25/2017 Creatinine0.90, BUN9, Potassium 4.3, Sodium125  01/22/2017 Creatinine0.85, BUN7, Potassium 4.0, Sodium125, EGFR>60  01/21/2017 Creatinine0.84, BUN11, Potassium4.0, Sodium124, EGFR>60  01/20/2017 Creatinine0.70, BUN14, Potassium4.5, [id number] 07/05/2016 Creatinine 0.94, BUN 14, Potassium 4.6, Sodium 127, EGFR >60 04/19/2016 Creatinine 0.9, BUN 13, Potassium 4.8, Sodium 131, EGFR 80.8 to 97.8  Recommendations: No changes.    Encouraged to call for fluid symptoms.  Follow-up plan: ICM clinic phone appointment on 11/15/2017.     Copy of ICM check sent to Dr. Caryl Comes.   3 month ICM trend: 10/15/2017    1 Year ICM trend:       Rosalene Billings, RN 10/15/2017 2:39 PM

## 2017-10-15 NOTE — Telephone Encounter (Signed)
ICM call to patient.  Assisted in sending remote transmission for review.

## 2017-10-17 ENCOUNTER — Ambulatory Visit (INDEPENDENT_AMBULATORY_CARE_PROVIDER_SITE_OTHER): Payer: Medicare Other | Admitting: Cardiology

## 2017-10-17 DIAGNOSIS — Z5181 Encounter for therapeutic drug level monitoring: Secondary | ICD-10-CM

## 2017-10-17 DIAGNOSIS — I482 Chronic atrial fibrillation, unspecified: Secondary | ICD-10-CM

## 2017-10-17 LAB — POCT INR: INR: 3.1 — AB (ref 2.0–3.0)

## 2017-10-17 NOTE — Patient Instructions (Addendum)
Description   Spoke with pt and instructed him to take 1 tablet today then continue taking 1 tablet daily except 1.5 tablets on Wednesdays and Fridays.  Recheck INR in 2 weeks.

## 2017-10-31 ENCOUNTER — Ambulatory Visit (INDEPENDENT_AMBULATORY_CARE_PROVIDER_SITE_OTHER): Payer: Medicare Other | Admitting: Internal Medicine

## 2017-10-31 DIAGNOSIS — Z5181 Encounter for therapeutic drug level monitoring: Secondary | ICD-10-CM

## 2017-10-31 DIAGNOSIS — I482 Chronic atrial fibrillation, unspecified: Secondary | ICD-10-CM

## 2017-10-31 DIAGNOSIS — Z7901 Long term (current) use of anticoagulants: Secondary | ICD-10-CM | POA: Diagnosis not present

## 2017-10-31 LAB — POCT INR: INR: 1.8 — AB (ref 2.0–3.0)

## 2017-11-02 DIAGNOSIS — M1711 Unilateral primary osteoarthritis, right knee: Secondary | ICD-10-CM | POA: Diagnosis not present

## 2017-11-07 ENCOUNTER — Ambulatory Visit (INDEPENDENT_AMBULATORY_CARE_PROVIDER_SITE_OTHER): Payer: Medicare Other | Admitting: Internal Medicine

## 2017-11-07 DIAGNOSIS — Z5181 Encounter for therapeutic drug level monitoring: Secondary | ICD-10-CM

## 2017-11-07 DIAGNOSIS — I482 Chronic atrial fibrillation, unspecified: Secondary | ICD-10-CM

## 2017-11-07 LAB — POCT INR: INR: 2.9 (ref 2.0–3.0)

## 2017-11-07 NOTE — Patient Instructions (Signed)
Description   Spoke with pt and instructed him to continue taking 1 tablet daily except 1.5 tablets on Wednesdays and Fridays.  Recheck INR in 2 weeks.      

## 2017-11-15 ENCOUNTER — Ambulatory Visit (INDEPENDENT_AMBULATORY_CARE_PROVIDER_SITE_OTHER): Payer: Medicare Other

## 2017-11-15 ENCOUNTER — Ambulatory Visit (INDEPENDENT_AMBULATORY_CARE_PROVIDER_SITE_OTHER): Payer: Medicare Other | Admitting: *Deleted

## 2017-11-15 DIAGNOSIS — Z95810 Presence of automatic (implantable) cardiac defibrillator: Secondary | ICD-10-CM

## 2017-11-15 DIAGNOSIS — I255 Ischemic cardiomyopathy: Secondary | ICD-10-CM

## 2017-11-15 DIAGNOSIS — I5022 Chronic systolic (congestive) heart failure: Secondary | ICD-10-CM

## 2017-11-15 NOTE — Progress Notes (Signed)
Remote ICD transmission.   

## 2017-11-16 NOTE — Progress Notes (Signed)
EPIC Encounter for ICM Monitoring  Patient Name: [name] is a 84 y.o. male Date: 11/16/2017 Primary Care Physican: [name], MD Primary Cardiologist:Cooper Electrophysiologist: Caryl Comes Dry Weight:163lbs        Heart Failure questions reviewed, pt asymptomatic.   Thoracic impedance normal.  Prescribed dosage: Furosemide 20 mg 1 tablet as needed  Labs: 04/03/2017 Creatinine0.86, BUN9, Potassium 4.0, Sodium127, EGFR>60 01/25/2017 Creatinine0.90, BUN9, Potassium 4.3, Sodium125  01/22/2017 Creatinine0.85, BUN7, Potassium 4.0, Sodium125, EGFR>60  01/21/2017 Creatinine0.84, BUN11, Potassium4.0, Sodium124, EGFR>60  01/20/2017 Creatinine0.70, BUN14, Potassium4.5, [id number] 07/05/2016 Creatinine 0.94, BUN 14, Potassium 4.6, Sodium 127, EGFR >60 04/19/2016 Creatinine 0.9, BUN 13, Potassium 4.8, Sodium 131, EGFR 80.8 to 97.8  Recommendations: No changes.   Encouraged to call for fluid symptoms.  Follow-up plan: ICM clinic phone appointment on 12/17/2017.   Office appointment scheduled 12/19/2017 with Dr. Burt Knack.    Copy of ICM check sent to Dr. Caryl Comes.   3 month ICM trend: 11/15/2017    1 Year ICM trend:       Rosalene Billings, RN 11/16/2017 2:22 PM

## 2017-11-20 DIAGNOSIS — M79674 Pain in right toe(s): Secondary | ICD-10-CM | POA: Diagnosis not present

## 2017-11-20 DIAGNOSIS — M79675 Pain in left toe(s): Secondary | ICD-10-CM | POA: Diagnosis not present

## 2017-11-20 DIAGNOSIS — I739 Peripheral vascular disease, unspecified: Secondary | ICD-10-CM | POA: Diagnosis not present

## 2017-11-20 DIAGNOSIS — B351 Tinea unguium: Secondary | ICD-10-CM | POA: Diagnosis not present

## 2017-11-21 ENCOUNTER — Ambulatory Visit (INDEPENDENT_AMBULATORY_CARE_PROVIDER_SITE_OTHER): Payer: Medicare Other | Admitting: Internal Medicine

## 2017-11-21 DIAGNOSIS — I482 Chronic atrial fibrillation, unspecified: Secondary | ICD-10-CM

## 2017-11-21 DIAGNOSIS — Z5181 Encounter for therapeutic drug level monitoring: Secondary | ICD-10-CM

## 2017-11-21 LAB — POCT INR: INR: 2.8 (ref 2.0–3.0)

## 2017-12-05 ENCOUNTER — Ambulatory Visit (INDEPENDENT_AMBULATORY_CARE_PROVIDER_SITE_OTHER): Payer: Medicare Other | Admitting: Cardiovascular Disease

## 2017-12-05 DIAGNOSIS — Z5181 Encounter for therapeutic drug level monitoring: Secondary | ICD-10-CM | POA: Diagnosis not present

## 2017-12-05 DIAGNOSIS — I482 Chronic atrial fibrillation, unspecified: Secondary | ICD-10-CM

## 2017-12-05 DIAGNOSIS — Z7901 Long term (current) use of anticoagulants: Secondary | ICD-10-CM | POA: Diagnosis not present

## 2017-12-05 LAB — POCT INR: INR: 2.7 (ref 2.0–3.0)

## 2017-12-06 ENCOUNTER — Telehealth: Payer: Self-pay | Admitting: Internal Medicine

## 2017-12-06 NOTE — Telephone Encounter (Signed)
Spoke with pt and pt's wife. They are both wondering if he can take a dose of lasix today for a swollen foot/ankle. I advised pt's wife that it would be ok and if he needed to begin taking it every day, please let us know so we can follow pt's labs and K+. Pt's wife has some frustrations with her husband bc she is not sure he really needs his lasix every day. I advised her to speak with Dr Burt Knack during his office visit next week. Pt's wife verbalized understanding and had no additional questions.

## 2017-12-06 NOTE — Telephone Encounter (Signed)
New Message:     Pt said he was supposed to have taken his Lasix 2 days ago and he forgot it.dn't do it. He wants to know if it is alright to take it today?

## 2017-12-11 LAB — CUP PACEART REMOTE DEVICE CHECK
Battery Remaining Longevity: 92 mo
Battery Voltage: 3.01 V
HIGH POWER IMPEDANCE MEASURED VALUE: 58 Ohm
HighPow Impedance: 45 Ohm
Implantable Lead Implant Date: 20021220
Implantable Pulse Generator Implant Date: 20141201
Lead Channel Impedance Value: 456 Ohm
Lead Channel Impedance Value: 589 Ohm
Lead Channel Sensing Intrinsic Amplitude: 17 mV
Lead Channel Setting Pacing Amplitude: 2 V
Lead Channel Setting Pacing Pulse Width: 0.4 ms
MDC IDC LEAD LOCATION: 753860
MDC IDC MSMT LEADCHNL RV PACING THRESHOLD AMPLITUDE: 1 V
MDC IDC MSMT LEADCHNL RV PACING THRESHOLD PULSEWIDTH: 0.4 ms
MDC IDC MSMT LEADCHNL RV SENSING INTR AMPL: 17 mV
MDC IDC SESS DTM: 20190905042202
MDC IDC SET LEADCHNL RV SENSING SENSITIVITY: 0.3 mV
MDC IDC STAT BRADY RV PERCENT PACED: 4.73 %

## 2017-12-12 DIAGNOSIS — R5383 Other fatigue: Secondary | ICD-10-CM | POA: Diagnosis not present

## 2017-12-12 DIAGNOSIS — M810 Age-related osteoporosis without current pathological fracture: Secondary | ICD-10-CM | POA: Diagnosis not present

## 2017-12-12 DIAGNOSIS — E559 Vitamin D deficiency, unspecified: Secondary | ICD-10-CM | POA: Diagnosis not present

## 2017-12-16 ENCOUNTER — Other Ambulatory Visit: Payer: Self-pay | Admitting: Cardiovascular Disease

## 2017-12-17 ENCOUNTER — Ambulatory Visit (INDEPENDENT_AMBULATORY_CARE_PROVIDER_SITE_OTHER): Payer: Medicare Other

## 2017-12-17 DIAGNOSIS — Z95810 Presence of automatic (implantable) cardiac defibrillator: Secondary | ICD-10-CM | POA: Diagnosis not present

## 2017-12-17 DIAGNOSIS — I5022 Chronic systolic (congestive) heart failure: Secondary | ICD-10-CM | POA: Diagnosis not present

## 2017-12-17 NOTE — Progress Notes (Signed)
EPIC Encounter for ICM Monitoring  Patient Name: [name] is a 84 y.o. male Date: 12/17/2017 Primary Care Physican: [name], MD Primary Cardiologist:Cooper Electrophysiologist: Caryl Comes Dry Weight:166.2lbs       Heart Failure questions reviewed, pt asymptomatic. He said he is feeling fine at this time.    Thoracic impedance normal but was suggesting fluid from 9/14 through 10/4 with exception of a few days close to baseline 9/25 to 9/30.  Prescribed: Furosemide 20 mg 1 tablet as needed  Labs: 04/03/2017 Creatinine0.86, BUN9, Potassium 4.0, Sodium127, EGFR>60  Recommendations: No changes.   Encouraged to call for fluid symptoms.  Follow-up plan: ICM clinic phone appointment on 01/17/2018.   Office appointment scheduled 12/19/2017 with Dr. Burt Knack.    Copy of ICM check sent to Dr. Caryl Comes and Dr Burt Knack.   3 month ICM trend: 12/17/2017    1 Year ICM trend:       Rosalene Billings, RN 12/17/2017 12:29 PM

## 2017-12-19 ENCOUNTER — Other Ambulatory Visit: Payer: Self-pay

## 2017-12-19 ENCOUNTER — Encounter: Payer: Self-pay | Admitting: Cardiovascular Disease

## 2017-12-19 ENCOUNTER — Ambulatory Visit (HOSPITAL_COMMUNITY): Payer: Medicare Other | Attending: Cardiovascular Disease

## 2017-12-19 ENCOUNTER — Ambulatory Visit (INDEPENDENT_AMBULATORY_CARE_PROVIDER_SITE_OTHER): Payer: Self-pay | Admitting: Cardiovascular Disease

## 2017-12-19 ENCOUNTER — Ambulatory Visit (INDEPENDENT_AMBULATORY_CARE_PROVIDER_SITE_OTHER): Payer: Medicare Other | Admitting: Cardiovascular Disease

## 2017-12-19 VITALS — BP 118/48 | HR 52 | Ht 74.0 in | Wt 173.0 lb

## 2017-12-19 DIAGNOSIS — I255 Ischemic cardiomyopathy: Secondary | ICD-10-CM | POA: Diagnosis not present

## 2017-12-19 DIAGNOSIS — I5022 Chronic systolic (congestive) heart failure: Secondary | ICD-10-CM | POA: Insufficient documentation

## 2017-12-19 DIAGNOSIS — I482 Chronic atrial fibrillation, unspecified: Secondary | ICD-10-CM | POA: Diagnosis not present

## 2017-12-19 DIAGNOSIS — Z952 Presence of prosthetic heart valve: Secondary | ICD-10-CM | POA: Diagnosis not present

## 2017-12-19 DIAGNOSIS — Z5181 Encounter for therapeutic drug level monitoring: Secondary | ICD-10-CM

## 2017-12-19 LAB — POCT INR: INR: 2.8 (ref 2.0–3.0)

## 2017-12-19 NOTE — Patient Instructions (Signed)
Medication Instructions:  Your provider recommends that you continue on your current medications as directed. Please refer to the Current Medication list given to you today.    Labwork: None  Testing/Procedures: None  Follow-Up: You have an appointment with Dr. Burt Knack on April 18, 2018 at 2:20PM.   Any Other Special Instructions Will Be Listed Below (If Applicable).     If you need a refill on your cardiac medications before your next appointment, please call your pharmacy.

## 2017-12-19 NOTE — Patient Instructions (Signed)
Description   Spoke with pt and instructed him to continue taking 1 tablet daily except 1.5 tablets on Wednesdays and Fridays.  Recheck INR in 2 weeks.      

## 2017-12-19 NOTE — Progress Notes (Signed)
Cardiology Office Note:    Date:  12/19/2017   ID:  [name], DOB May 12, 1932, MRN [id number]  PCP:  [name], MD  Cardiologist:  Sherren Mocha, MD  Electrophysiologist:  None   Referring MD: [name], MD   Chief Complaint  Patient presents with  . Fatigue    History of Present Illness:    [name] is a 84 y.o. male with a hx of coronary artery disease status post CABG in 1979 and redo CABG in 1991.  He has long-standing severe ischemic cardiomyopathy.  The patient underwent TAVR at the [facility] clinic in 2012 and has been followed for mild to moderate paravalvular aortic insufficiency since that time.  He is also followed for permanent atrial fibrillation maintained on long-term anticoagulation.  He has not been able to tolerate heart failure medication because of low blood pressure and frailty.  The patient is here with his wife today.  He continues to struggle with low back pain after another compression fracture.  He also is having problems with his right knee giving out.  He ambulates with a walker.  He really only ambulates much with assistance because of increasing fall risk.  He denies any shortness of breath, orthopnea, PND, or leg swelling.  He has had no lightheadedness or syncope.  He had an echo today just prior to his office visit.  Past Medical History:  Diagnosis Date  . AF (atrial fibrillation) ([place])    Has not tolerated amiodarone in the past. Amiodarone was stopped in September of 2010 due to side effects  . Aortic stenosis, severe    WITH PERCUTANEOUS AORTIC VALVE (TAVI) IN March 2012 at the [facility]  . Cervical myelopathy ([place])   . CHF (congestive heart failure) ([place])   . Chronic anticoagulation    on coumadin  . Coronary artery disease   . Diverticulitis    CURRENTLY CONTROLLED WITH NO EVIDENCE OF RECURRENT INFECTION  . Esophageal stricture    WITH DILATATION  . GERD (gastroesophageal reflux disease)   . Heart murmur   .  Hyperlipidemia   . Ischemic cardiomyopathy 05/2010   Has EF of 25%  . MI (myocardial infarction) ([place]) 1974, 1978  . NSVT (nonsustained ventricular tachycardia) ([place])   . Osteoarthritis    RIGHT KNEE  . Osteoporosis   . Prostate cancer ([place])   . Pulmonary embolism ([place])   . Stroke [facility])    3 strokes    Past Surgical History:  Procedure Laterality Date  . AORTIC VALVE REPLACEMENT     Percutaneous AVR in March 2012 at the [facility]  . BACK SURGERY    . CARDIAC CATHETERIZATION  2010   SEVERE LV DYSFUNCTION WITH ESTIMATED EJECTION FRACTION OF 25%  . CARDIOVERSION  02/16/2011   Procedure: CARDIOVERSION;  Surgeon: Carlena Bjornstad, MD;  Location: [place];  Service: Cardiovascular;  Laterality: N/A;  . CHOLECYSTECTOMY    . CORONARY ARTERY BYPASS GRAFT  1979  . CORONARY ARTERY BYPASS GRAFT  1991   REDO SURGERY  . ICD  Feb 2003; 02/2013   gen change 02-11-2013 by Dr Caryl Comes  . IMPLANTABLE CARDIOVERTER DEFIBRILLATOR (ICD) GENERATOR CHANGE N/A 02/10/2013   Procedure: ICD GENERATOR CHANGE;  Surgeon: Deboraha Sprang, MD;  Location: [facility] CATH LAB;  Service: Cardiovascular;  Laterality: N/A;  . SHOULDER SURGERY      Current Medications: Current Meds  Medication Sig  . acetaminophen (TYLENOL) 500 MG tablet Take 1,000 mg by mouth every 6 (six)  hours as needed for mild pain.   Marland Kitchen albuterol (PROVENTIL HFA;VENTOLIN HFA) 108 (90 Base) MCG/ACT inhaler Inhale 1-2 puffs into the lungs every 6 (six) hours as needed for wheezing or shortness of breath.  . alfuzosin (UROXATRAL) 10 MG 24 hr tablet Take 10 mg by mouth at bedtime.   Marland Kitchen aspirin 81 MG tablet Take 81 mg by mouth daily.  Marland Kitchen atorvastatin (LIPITOR) 10 MG tablet Take 10 mg by mouth every other day.  . benzonatate (TESSALON) 100 MG capsule Take 100 mg by mouth 3 (three) times daily as needed for cough.  . Budesonide-Formoterol Fumarate (SYMBICORT IN) Inhale 1 puff into the lungs 2 (two) times daily.  . carvedilol (COREG) 6.25 MG tablet TAKE 1 TABLET BY  MOUTH TWICE DAILY WITH A MEAL.  . cholecalciferol (VITAMIN D) 1000 UNITS tablet Take 1,000 Units by mouth 2 (two) times daily.  Marland Kitchen COUMADIN 5 MG tablet TAKE AS DIRECTED BY COUMADIN CLINIC.  Marland Kitchen dicyclomine (BENTYL) 20 MG tablet Take 1 tablet (20 mg total) by mouth 2 (two) times daily as needed for spasms.  . furosemide (LASIX) 20 MG tablet TAKE (1) TABLET DAILY AS NEEDED FOR SWELLING.  Marland Kitchen ipratropium (ATROVENT) 0.03 % nasal spray Place 2 sprays into both nostrils 2 (two) times daily.  Marland Kitchen lactulose (CEPHULAC) 10 g packet Take 1 packet (10 g total) daily as needed by mouth (for severe constipation).  Marland Kitchen levothyroxine (SYNTHROID) 125 MCG tablet Take 1 tablet (125 mcg total) by mouth daily before breakfast.  . linaclotide (LINZESS) 145 MCG CAPS capsule Take 145 mcg by mouth daily before breakfast.  . nitroGLYCERIN (NITROSTAT) 0.4 MG SL tablet Place 1 tablet (0.4 mg total) under the tongue every 5 (five) minutes x 3 doses as needed for chest pain (Max 3 doses in 15 min. Call 911).  Marland Kitchen omeprazole (PRILOSEC) 20 MG capsule Take 20 mg by mouth daily as needed (acid reflux).  . polyethylene glycol (MIRALAX / GLYCOLAX) packet Take 17 g by mouth daily as needed for mild constipation.   . Probiotic Product (PROBIOTIC DAILY PO) Take 1 capsule by mouth daily.  . RESTASIS 0.05 % ophthalmic emulsion Place 1 drop into both eyes 2 (two) times daily as needed (dry eyes).   . traMADol (ULTRAM) 50 MG tablet Take 50 mg by mouth every 6 (six) hours as needed for moderate pain.     Allergies:   Penicillins; Tizanidine; Ace inhibitors; Antihistamines, diphenhydramine-type; Clemastine fumarate; Dimenhydrinate; and Amiodarone   Social History   Socioeconomic History  . Marital status: Married    Spouse name: Not on file  . Number of children: Y  . Years of education: Not on file  . Highest education level: Not on file  Occupational History  . Occupation: retired. developer.     Employer: RETIRED  Social Needs  .  Financial resource strain: Not on file  . Food insecurity:    Worry: Not on file    Inability: Not on file  . Transportation needs:    Medical: Not on file    Non-medical: Not on file  Tobacco Use  . Smoking status: Former Smoker    Packs/day: 4.00    Years: 15.00    Pack years: 60.00    Types: Cigarettes    Last attempt to quit: 06/15/1969    Years since quitting: 48.5  . Smokeless tobacco: Never Used  Substance and Sexual Activity  . Alcohol use: No  . Drug use: No  . Sexual activity: Yes  Lifestyle  .  Physical activity:    Days per week: Not on file    Minutes per session: Not on file  . Stress: Not on file  Relationships  . Social connections:    Talks on phone: Not on file    Gets together: Not on file    Attends religious service: Not on file    Active member of club or organization: Not on file    Attends meetings of clubs or organizations: Not on file    Relationship status: Not on file  Other Topics Concern  . Not on file  Social History Narrative  . Not on file     Family History: The patient's family history includes Diabetes (age of onset: 37) in his mother; Heart disease (age of onset: 86) in his mother; Heart disease (age of onset: 31) in his father.  ROS:   Please see the history of present illness.    Positive for back pain, muscle pain, easy bruising, excessive fatigue, leg pain, wheezing, constipation, difficulty urinating.  All other systems reviewed and are negative.  EKGs/Labs/Other Studies Reviewed:    The following studies were reviewed today: The patient's echocardiogram is reviewed.  The formal interpretation is pending.  There is severe LV systolic dysfunction with LVEF estimated at 20 to 25%.  There is moderate paravalvular regurgitation involving the patient's TAVR prosthesis, unchanged from past studies.  EKG:  EKG is not ordered today.    Recent Labs: 01/21/2017: TSH 2.815 04/03/2017: ALT 11; BUN 9; Creatinine, Ser 0.86; Hemoglobin  12.4; Platelets 171; Potassium 4.0; Sodium 127  Recent Lipid Panel    Component Value Date/Time   CHOL 142 02/01/2016 1111   TRIG 72 02/01/2016 1111   HDL 51 02/01/2016 1111   CHOLHDL 2.8 02/01/2016 1111   VLDL 14 02/01/2016 1111   LDLCALC 77 02/01/2016 1111    Physical Exam:    VS:  BP (!) 118/48   Pulse (!) 52   Ht 6\' 2"  (1.88 m)   Wt 173 lb (78.5 kg)   BMI 22.21 kg/m     Wt Readings from Last 3 Encounters:  12/19/17 173 lb (78.5 kg)  08/10/17 175 lb (79.4 kg)  08/01/17 174 lb (78.9 kg)     GEN: Pleasant elderly frail-appearing man in no acute distress HEENT: Normal NECK: No JVD; No carotid bruits LYMPHATICS: No lymphadenopathy CARDIAC: RRR, soft systolic ejection murmur at the right upper sternal border, grade 2/6 diastolic decrescendo murmur at the left lower sternal border RESPIRATORY:  Clear to auscultation without rales, wheezing or rhonchi  ABDOMEN: Soft, non-tender, non-distended MUSCULOSKELETAL:  No edema; No deformity  SKIN: Warm and dry NEUROLOGIC:  Alert and oriented x 3 PSYCHIATRIC:  Normal affect   ASSESSMENT:    1. Chronic systolic heart failure ([place])   2. S/P aortic valve replacement   3. Atrial fibrillation, chronic    PLAN:    In order of problems listed above:  1. The patient appears stable from a cardiac perspective.  I personally reviewed his echo images today which demonstrate severe LV dysfunction without significant change from past studies.  He tolerates carvedilol, but with his frailty and low blood pressure with major fall risk, I have not tried him on more aggressive therapy.  There is no evidence of volume excess.  No need for diuretic therapy. 2. The patient's TAVR prosthesis appears to be functioning normally with the exception of moderate paravalvular regurgitation which appears unchanged over time. 3. Appropriately anticoagulated with warfarin.  Medication Adjustments/Labs and Tests Ordered: Current medicines are reviewed at  length with the patient today.  Concerns regarding medicines are outlined above.  No orders of the defined types were placed in this encounter.  No orders of the defined types were placed in this encounter.   Patient Instructions  Medication Instructions:  Your provider recommends that you continue on your current medications as directed. Please refer to the Current Medication list given to you today.    Labwork: None  Testing/Procedures: None  Follow-Up: You have an appointment with Dr. Burt Knack on April 18, 2018 at 2:20PM.   Any Other Special Instructions Will Be Listed Below (If Applicable).     If you need a refill on your cardiac medications before your next appointment, please call your pharmacy.      Signed, Sherren Mocha, MD  12/19/2017 5:50 PM    [facility]

## 2017-12-25 DIAGNOSIS — M1711 Unilateral primary osteoarthritis, right knee: Secondary | ICD-10-CM | POA: Diagnosis not present

## 2017-12-25 DIAGNOSIS — M810 Age-related osteoporosis without current pathological fracture: Secondary | ICD-10-CM | POA: Diagnosis not present

## 2017-12-28 DIAGNOSIS — Z8546 Personal history of malignant neoplasm of prostate: Secondary | ICD-10-CM | POA: Diagnosis not present

## 2017-12-28 DIAGNOSIS — R339 Retention of urine, unspecified: Secondary | ICD-10-CM | POA: Diagnosis not present

## 2018-01-02 ENCOUNTER — Ambulatory Visit (INDEPENDENT_AMBULATORY_CARE_PROVIDER_SITE_OTHER): Payer: Medicare Other | Admitting: Internal Medicine

## 2018-01-02 DIAGNOSIS — Z5181 Encounter for therapeutic drug level monitoring: Secondary | ICD-10-CM

## 2018-01-02 DIAGNOSIS — I482 Chronic atrial fibrillation, unspecified: Secondary | ICD-10-CM | POA: Diagnosis not present

## 2018-01-02 LAB — POCT INR: INR: 2.5 (ref 2.0–3.0)

## 2018-01-02 NOTE — Patient Instructions (Signed)
Description   Spoke with pt and instructed him to continue taking 1 tablet daily except 1.5 tablets on Wednesdays and Fridays.  Recheck INR in 2 weeks.      

## 2018-01-06 ENCOUNTER — Other Ambulatory Visit: Payer: Self-pay | Admitting: Cardiovascular Disease

## 2018-01-08 DIAGNOSIS — Z23 Encounter for immunization: Secondary | ICD-10-CM | POA: Diagnosis not present

## 2018-01-08 DIAGNOSIS — Z952 Presence of prosthetic heart valve: Secondary | ICD-10-CM | POA: Diagnosis not present

## 2018-01-08 DIAGNOSIS — J449 Chronic obstructive pulmonary disease, unspecified: Secondary | ICD-10-CM | POA: Diagnosis not present

## 2018-01-08 DIAGNOSIS — Z7901 Long term (current) use of anticoagulants: Secondary | ICD-10-CM | POA: Diagnosis not present

## 2018-01-08 DIAGNOSIS — M810 Age-related osteoporosis without current pathological fracture: Secondary | ICD-10-CM | POA: Diagnosis not present

## 2018-01-08 DIAGNOSIS — E038 Other specified hypothyroidism: Secondary | ICD-10-CM | POA: Diagnosis not present

## 2018-01-08 DIAGNOSIS — Z6824 Body mass index (BMI) 24.0-24.9, adult: Secondary | ICD-10-CM | POA: Diagnosis not present

## 2018-01-08 DIAGNOSIS — I482 Chronic atrial fibrillation, unspecified: Secondary | ICD-10-CM | POA: Diagnosis not present

## 2018-01-08 DIAGNOSIS — L989 Disorder of the skin and subcutaneous tissue, unspecified: Secondary | ICD-10-CM | POA: Diagnosis not present

## 2018-01-08 DIAGNOSIS — R110 Nausea: Secondary | ICD-10-CM | POA: Diagnosis not present

## 2018-01-08 DIAGNOSIS — E46 Unspecified protein-calorie malnutrition: Secondary | ICD-10-CM | POA: Diagnosis not present

## 2018-01-08 DIAGNOSIS — D692 Other nonthrombocytopenic purpura: Secondary | ICD-10-CM | POA: Diagnosis not present

## 2018-01-09 DIAGNOSIS — Z85828 Personal history of other malignant neoplasm of skin: Secondary | ICD-10-CM | POA: Diagnosis not present

## 2018-01-09 DIAGNOSIS — D225 Melanocytic nevi of trunk: Secondary | ICD-10-CM | POA: Diagnosis not present

## 2018-01-09 DIAGNOSIS — L570 Actinic keratosis: Secondary | ICD-10-CM | POA: Diagnosis not present

## 2018-01-09 DIAGNOSIS — D1801 Hemangioma of skin and subcutaneous tissue: Secondary | ICD-10-CM | POA: Diagnosis not present

## 2018-01-09 DIAGNOSIS — L821 Other seborrheic keratosis: Secondary | ICD-10-CM | POA: Diagnosis not present

## 2018-01-16 ENCOUNTER — Ambulatory Visit (INDEPENDENT_AMBULATORY_CARE_PROVIDER_SITE_OTHER): Payer: Medicare Other | Admitting: Cardiovascular Disease

## 2018-01-16 DIAGNOSIS — Z5181 Encounter for therapeutic drug level monitoring: Secondary | ICD-10-CM

## 2018-01-16 DIAGNOSIS — I482 Chronic atrial fibrillation, unspecified: Secondary | ICD-10-CM

## 2018-01-16 LAB — POCT INR: INR: 2.5 (ref 2.0–3.0)

## 2018-01-17 ENCOUNTER — Ambulatory Visit (INDEPENDENT_AMBULATORY_CARE_PROVIDER_SITE_OTHER): Payer: Medicare Other

## 2018-01-17 DIAGNOSIS — I5022 Chronic systolic (congestive) heart failure: Secondary | ICD-10-CM

## 2018-01-17 DIAGNOSIS — Z95810 Presence of automatic (implantable) cardiac defibrillator: Secondary | ICD-10-CM

## 2018-01-18 NOTE — Progress Notes (Signed)
EPIC Encounter for ICM Monitoring  Patient Name: [name] is a 84 y.o. male Date: 01/18/2018 Primary Care Physican: Russo, Alder, MD Primary Cardiologist:Cooper Electrophysiologist: Klein Last Weight: 166.2lbs Today's Weight:  160.2 lbs        Heart Failure questions reviewed, pt asymptomatic.   Thoracic impedance normal.   Prescribed: Furosemide 20 mg 1 tablet as needed  Labs: 04/03/2017 Creatinine0.86, BUN9, Potassium 4.0, Sodium127, EGFR>60  Recommendations: No changes.    Encouraged to call for fluid symptoms.  Follow-up plan: ICM clinic phone appointment on 02/19/2018.    Copy of ICM check sent to Dr. Klein.   3 month ICM trend: 01/17/2018    1 Year ICM trend:        S , RN 01/18/2018 8:00 AM   

## 2018-01-19 ENCOUNTER — Ambulatory Visit (HOSPITAL_COMMUNITY)
Admission: EM | Admit: 2018-01-19 | Discharge: 2018-01-19 | Disposition: A | Discharge status: Home / Self Care | Payer: Medicare Other | Attending: Family Medicine | Admitting: Family Medicine

## 2018-01-19 ENCOUNTER — Encounter (HOSPITAL_COMMUNITY): Payer: Self-pay | Admitting: Emergency Medicine

## 2018-01-19 DIAGNOSIS — S8011XA Contusion of right lower leg, initial encounter: Secondary | ICD-10-CM

## 2018-01-19 DIAGNOSIS — S80811A Abrasion, right lower leg, initial encounter: Secondary | ICD-10-CM | POA: Diagnosis not present

## 2018-01-19 NOTE — ED Provider Notes (Signed)
[place]    CSN: [id number] Arrival date & time: 01/19/18  1238     History   Chief Complaint No chief complaint on file.   HPI [name] is a 84 y.o. male.   84 year old male with history of afib, valve replacement on coumadin comes in for continued bleeding after skin tear few days ago. States has been cleaning area with betadine and dressing wound with nonadhesive gauze. However, bleeding continues, and can seep through the gauze. No spreading erythema, warmth, fever. Denies calf/leg pain. Last INR 2.5, within goal.      Past Medical History:  Diagnosis Date  . AF (atrial fibrillation) ([place])    Has not tolerated amiodarone in the past. Amiodarone was stopped in September of 2010 due to side effects  . Aortic stenosis, severe    WITH PERCUTANEOUS AORTIC VALVE (TAVI) IN March 2012 at the [facility]  . Cervical myelopathy ([place])   . CHF (congestive heart failure) ([place])   . Chronic anticoagulation    on coumadin  . Coronary artery disease   . Diverticulitis    CURRENTLY CONTROLLED WITH NO EVIDENCE OF RECURRENT INFECTION  . Esophageal stricture    WITH DILATATION  . GERD (gastroesophageal reflux disease)   . Heart murmur   . Hyperlipidemia   . Ischemic cardiomyopathy 05/2010   Has EF of 25%  . MI (myocardial infarction) ([place]) 1974, 1978  . NSVT (nonsustained ventricular tachycardia) ([place])   . Osteoarthritis    RIGHT KNEE  . Osteoporosis   . Prostate cancer ([place])   . Pulmonary embolism ([place])   . Stroke [facility])    3 strokes    Patient Active Problem List   Diagnosis Date Noted  . Pressure injury of skin 01/21/2017  . Acute urinary retention 01/21/2017  . Shortness of breath   . Weakness 09/01/2015  . Hyponatremia 09/01/2015  . Hypothyroidism 09/01/2015  . DOE (dyspnea on exertion) 09/01/2015  . Chronic systolic CHF (congestive heart failure) ([place]) 09/01/2015  . Exertional dyspnea 09/01/2015  . GI bleed 01/16/2014  . GIB  (gastrointestinal bleeding) 01/16/2014  . Encounter for therapeutic drug monitoring 05/09/2013  . Automatic implantable cardioverter-defibrillator in situ 12/13/2012  . Rectal bleed 05/30/2012    Class: Acute  . Acute low back pain 05/30/2012    Class: Acute  . Hypotension 05/30/2012    Class: Acute  . Vertebral fracture, osteoporotic ([place]) 05/21/2012  . Gait instability 12/02/2011    Class: Acute  . Atrial fibrillation, chronic 12/02/2011    Class: Chronic  . Anticoagulated on Coumadin 12/02/2011  . Deformity, finger acquired 12/19/2010  . Chronic cough 12/05/2010  . S/P aortic valve replacement 10/22/2010  . Ischemic cardiomyopathy   . Diverticulitis   . Prostate cancer ([place])   . Osteoarthritis   . Cervical myelopathy ([place])   . Pulmonary embolism ([place])   . Hyperlipidemia   . GERD (gastroesophageal reflux disease)   . Esophageal stricture   . Indigestion 06/29/2010  . VENTRICULAR TACHYCARDIA 01/15/2009    Past Surgical History:  Procedure Laterality Date  . AORTIC VALVE REPLACEMENT     Percutaneous AVR in March 2012 at the [facility]  . BACK SURGERY    . CARDIAC CATHETERIZATION  2010   SEVERE LV DYSFUNCTION WITH ESTIMATED EJECTION FRACTION OF 25%  . CARDIOVERSION  02/16/2011   Procedure: CARDIOVERSION;  Surgeon: Carlena Bjornstad, MD;  Location: [facility];  Service: Cardiovascular;  Laterality: N/A;  . CHOLECYSTECTOMY    .  CORONARY ARTERY BYPASS GRAFT  1979  . CORONARY ARTERY BYPASS GRAFT  1991   REDO SURGERY  . ICD  Feb 2003; 02/2013   gen change 02-11-2013 by Dr Caryl Comes  . IMPLANTABLE CARDIOVERTER DEFIBRILLATOR (ICD) GENERATOR CHANGE N/A 02/10/2013   Procedure: ICD GENERATOR CHANGE;  Surgeon: Deboraha Sprang, MD;  Location: [facility] CATH LAB;  Service: Cardiovascular;  Laterality: N/A;  . SHOULDER SURGERY         Home Medications    Prior to Admission medications   Medication Sig Start Date End Date Taking? Authorizing Provider  acetaminophen (TYLENOL) 500 MG tablet  Take 1,000 mg by mouth every 6 (six) hours as needed for mild pain.     [provider]  albuterol (PROVENTIL HFA;VENTOLIN HFA) 108 (90 Base) MCG/ACT inhaler Inhale 1-2 puffs into the lungs every 6 (six) hours as needed for wheezing or shortness of breath.    [provider]  alfuzosin (UROXATRAL) 10 MG 24 hr tablet Take 10 mg by mouth at bedtime.     [provider]  aspirin 81 MG tablet Take 81 mg by mouth daily.    [provider]  atorvastatin (LIPITOR) 10 MG tablet Take 10 mg by mouth every other day.    [provider]  benzonatate (TESSALON) 100 MG capsule Take 100 mg by mouth 3 (three) times daily as needed for cough.    [provider]  Budesonide-Formoterol Fumarate (SYMBICORT IN) Inhale 1 puff into the lungs 2 (two) times daily.    [provider]  carvedilol (COREG) 6.25 MG tablet TAKE 1 TABLET BY MOUTH TWICE DAILY WITH A MEAL. 01/07/18   Sherren Mocha, MD  cholecalciferol (VITAMIN D) 1000 UNITS tablet Take 1,000 Units by mouth 2 (two) times daily.    [provider]  COUMADIN 5 MG tablet TAKE AS DIRECTED BY COUMADIN CLINIC. 12/17/17   Sherren Mocha, MD  dicyclomine (BENTYL) 20 MG tablet Take 1 tablet (20 mg total) by mouth 2 (two) times daily as needed for spasms. 07/06/16   Carlisle Cater, PA-C  furosemide (LASIX) 20 MG tablet TAKE (1) TABLET DAILY AS NEEDED FOR SWELLING. 06/19/16   Sherren Mocha, MD  ipratropium (ATROVENT) 0.03 % nasal spray Place 2 sprays into both nostrils 2 (two) times daily.    [provider]  lactulose (CEPHULAC) 10 g packet Take 1 packet (10 g total) daily as needed by mouth (for severe constipation). 01/22/17   Dessa Phi, DO  levothyroxine (SYNTHROID) 125 MCG tablet Take 1 tablet (125 mcg total) by mouth daily before breakfast. 09/02/15   Hosie Poisson, MD  linaclotide (LINZESS) 145 MCG CAPS capsule Take 145 mcg by mouth daily before breakfast.    [provider]    nitroGLYCERIN (NITROSTAT) 0.4 MG SL tablet Place 1 tablet (0.4 mg total) under the tongue every 5 (five) minutes x 3 doses as needed for chest pain (Max 3 doses in 15 min. Call 911). 12/06/16   Sherren Mocha, MD  omeprazole (PRILOSEC) 20 MG capsule Take 20 mg by mouth daily as needed (acid reflux).    [provider]  polyethylene glycol (MIRALAX / GLYCOLAX) packet Take 17 g by mouth daily as needed for mild constipation.     [provider]  Probiotic Product (PROBIOTIC DAILY PO) Take 1 capsule by mouth daily.    [provider]  RESTASIS 0.05 % ophthalmic emulsion Place 1 drop into both eyes 2 (two) times daily as needed (dry eyes).  12/05/11  [provider]  traMADol (ULTRAM) 50 MG tablet Take 50 mg by mouth every 6 (six) hours as needed for moderate pain.    [provider]    Family History Family History  Problem Relation Age of Onset  . Heart disease Mother 33  . Diabetes Mother 58  . Heart disease Father 63    Social History Social History   Tobacco Use  . Smoking status: Former Smoker    Packs/day: 4.00    Years: 15.00    Pack years: 60.00    Types: Cigarettes    Last attempt to quit: 06/15/1969    Years since quitting: 48.6  . Smokeless tobacco: Never Used  Substance Use Topics  . Alcohol use: No  . Drug use: No     Allergies   Penicillins; Tizanidine; Ace inhibitors; Antihistamines, diphenhydramine-type; Clemastine fumarate; Dimenhydrinate; and Amiodarone   Review of Systems Review of Systems  Reason unable to perform ROS: See HPI as above.     Physical Exam Triage Vital Signs ED Triage Vitals [01/19/18 1330]  Enc Vitals Group     BP 103/60     Pulse Rate 87     Resp 16     Temp (!) 97.3 F (36.3 C)     Temp src      SpO2 100 %     Weight      Height      Head Circumference      Peak Flow      Pain Score 0     Pain Loc      Pain Edu?      Excl. in [place]?    No data found.  Updated Vital Signs BP  103/60   Pulse 87   Temp (!) 97.3 F (36.3 C)   Resp 16   SpO2 100%   Physical Exam  Constitutional: He is oriented to person, place, and time. He appears well-developed and well-nourished. No distress.  HENT:  Head: Normocephalic and atraumatic.  Eyes: Pupils are equal, round, and reactive to light. Conjunctivae are normal.  Musculoskeletal:  See picture below. Mild continuous bleeding, controlled with pressure. Abrasion about 13cm long, 2.5cm wide. Surrounding contusion without erythema, warmth. No leg swelling. No tenderness to palpation of the calf.  Neurological: He is alert and oriented to person, place, and time.  Skin: He is not diaphoretic.       UC Treatments / Results  Labs (all labs ordered are listed, but only abnormal results are displayed) Labs Reviewed - No data to display  EKG None  Radiology No results found.  Procedures Procedures (including critical care time)  Medications Ordered in UC Medications - No data to display  Initial Impression / Assessment and Plan / UC Course  I have reviewed the triage vital signs and the nursing notes.  Pertinent labs & imaging results that were available during my care of the patient were reviewed by me and considered in my medical decision making (see chart for details).    Wound cleaned with wound cleanser spray. Discussed will leave surrounding dry blood intact in case of further irritation of wound. Surgicel applied to help bleeding. nonadhesive gauze applied. Patient tolerated well.  Wound care instructions provided. Return precautions given. Patient and spouse expresses understanding and agrees to plan.  Final Clinical Impressions(s) / UC Diagnoses   Final diagnoses:  Abrasion, right lower leg, initial encounter    ED Prescriptions    None  Ok Edwards, PA-C 01/19/18 1351

## 2018-01-19 NOTE — Discharge Instructions (Signed)
You can remove current dressing in 2-3 days. You can apply the wet gauze (petroleum covered) and then the nonadhesive gauze to dress the wound. As discussed, the clotting factor mesh will stay on and dissolve on own. Do not rub on the wound as it can cause increase bleeding. You can change th dressings every 2-3 days. Monitor healing with PCP as needed. Monitor for spreading redness, warmth, increased pain, fever, follow up for reevaluation needed.

## 2018-01-19 NOTE — ED Triage Notes (Signed)
Triaged by provider  

## 2018-01-24 ENCOUNTER — Encounter (HOSPITAL_COMMUNITY): Payer: Self-pay | Admitting: Emergency Medicine

## 2018-01-24 ENCOUNTER — Ambulatory Visit (HOSPITAL_COMMUNITY)
Admission: EM | Admit: 2018-01-24 | Discharge: 2018-01-24 | Disposition: A | Discharge status: Home / Self Care | Payer: Medicare Other | Attending: Family Medicine | Admitting: Family Medicine

## 2018-01-24 DIAGNOSIS — S80811A Abrasion, right lower leg, initial encounter: Secondary | ICD-10-CM

## 2018-01-24 DIAGNOSIS — S80811D Abrasion, right lower leg, subsequent encounter: Secondary | ICD-10-CM | POA: Diagnosis not present

## 2018-01-24 NOTE — ED Triage Notes (Signed)
Pt was seen here last week and had his R leg wrapped up, pt states yesterday it started hurting and was told to come back to see if it was infected.

## 2018-01-24 NOTE — ED Provider Notes (Signed)
[name]   [id number] 01/24/18 Arrival Time: 0347  ASSESSMENT & PLAN:  1. Abrasion, right lower leg, initial encounter   Wound re-dressed by RN. Continue current wound care. This may take some time to heal. Discussed option for wound care clinic.  Follow-up Information    [name], MD.   Specialty:  Internal Medicine Why:  As needed. Contact information: [address] [place] [address] [phone]        [facility]   Specialty:  Wound Care Contact information: [place], [address] [id number] [place] [phone]         Reviewed expectations re: course of current medical issues. Questions answered. Outlined signs and symptoms indicating need for more acute intervention. Patient verbalized understanding. After Visit Summary given.   SUBJECTIVE:  [name] is a 84 y.o. male who presents for recheck of his RLE lower leg abrasion. Has been changing dressing regularly. No significant bleeding or drainage. Afebrile. Ambulatory with walker.  ROS: As per HPI.   OBJECTIVE:  Vitals:   01/24/18 1329  BP: 132/69  Pulse: 60  Resp: 14  Temp: (!) 97.3 F (36.3 C)  SpO2: 98%     General appearance: alert; no distress Skin: anterior lower R leg abrasions do not appear infected; no active bleeding; no swelling Psychological: alert and cooperative; normal mood and affect  Results for orders placed or performed in visit on 01/16/18  POCT INR  Result Value Ref Range   INR 2.5 2.0 - 3.0   *Note: Due to a large number of results and/or encounters for the requested time period, some results have not been displayed. A complete set of results can be found in Results Review.    Labs Reviewed - No data to display    Allergies  Allergen Reactions  . Penicillins Shortness Of Breath and Rash    Has patient had a PCN reaction causing immediate rash, facial/tongue/throat swelling, SOB  or lightheadedness with hypotension: Yes Has patient had a PCN reaction causing severe rash involving mucus membranes or skin necrosis: No Has patient had a PCN reaction that required hospitalization No Has patient had a PCN reaction occurring within the last 10 years: No If all of the above answers are "NO", then may proceed with Cephalosporin use.   . Tizanidine Shortness Of Breath    Light headed  . Ace Inhibitors     Has not tolerated in the past due to hyperkalemia  . Antihistamines, Diphenhydramine-Type Other (See Comments)    Inhibits urination  . Clemastine Fumarate Other (See Comments)    Inhibits urination  . Dimenhydrinate Other (See Comments)    Inhibits urination  . Amiodarone Other (See Comments)    Unknown     Past Medical History:  Diagnosis Date  . AF (atrial fibrillation) ([place])    Has not tolerated amiodarone in the past. Amiodarone was stopped in September of 2010 due to side effects  . Aortic stenosis, severe    WITH PERCUTANEOUS AORTIC VALVE (TAVI) IN March 2012 at the [facility]  . Cervical myelopathy ([place])   . CHF (congestive heart failure) ([place])   . Chronic anticoagulation    on coumadin  . Coronary artery disease   . Diverticulitis    CURRENTLY CONTROLLED WITH NO EVIDENCE OF RECURRENT INFECTION  . Esophageal stricture    WITH DILATATION  . GERD (gastroesophageal reflux disease)   . Heart murmur   . Hyperlipidemia   .  Ischemic cardiomyopathy 05/2010   Has EF of 25%  . MI (myocardial infarction) ([place]) 1974, 1978  . NSVT (nonsustained ventricular tachycardia) ([place])   . Osteoarthritis    RIGHT KNEE  . Osteoporosis   . Prostate cancer ([place])   . Pulmonary embolism ([place])   . Stroke [facility])    3 strokes   Social History   Socioeconomic History  . Marital status: Married    Spouse name: Not on file  . Number of children: Y  . Years of education: Not on file  . Highest education level: Not on file  Occupational History  . Occupation:  retired. developer.     Employer: RETIRED  Social Needs  . Financial resource strain: Not on file  . Food insecurity:    Worry: Not on file    Inability: Not on file  . Transportation needs:    Medical: Not on file    Non-medical: Not on file  Tobacco Use  . Smoking status: Former Smoker    Packs/day: 4.00    Years: 15.00    Pack years: 60.00    Types: Cigarettes    Last attempt to quit: 06/15/1969    Years since quitting: 48.6  . Smokeless tobacco: Never Used  Substance and Sexual Activity  . Alcohol use: No  . Drug use: No  . Sexual activity: Yes  Lifestyle  . Physical activity:    Days per week: Not on file    Minutes per session: Not on file  . Stress: Not on file  Relationships  . Social connections:    Talks on phone: Not on file    Gets together: Not on file    Attends religious service: Not on file    Active member of club or organization: Not on file    Attends meetings of clubs or organizations: Not on file    Relationship status: Not on file  Other Topics Concern  . Not on file  Social History Narrative  . Not on file         Vanessa Kick, MD 01/24/18 1356

## 2018-01-30 ENCOUNTER — Ambulatory Visit (INDEPENDENT_AMBULATORY_CARE_PROVIDER_SITE_OTHER): Payer: Medicare Other | Admitting: Internal Medicine

## 2018-01-30 DIAGNOSIS — I482 Chronic atrial fibrillation, unspecified: Secondary | ICD-10-CM

## 2018-01-30 DIAGNOSIS — Z5181 Encounter for therapeutic drug level monitoring: Secondary | ICD-10-CM | POA: Diagnosis not present

## 2018-01-30 DIAGNOSIS — Z7901 Long term (current) use of anticoagulants: Secondary | ICD-10-CM | POA: Diagnosis not present

## 2018-01-30 LAB — POCT INR: INR: 2.5 (ref 2.0–3.0)

## 2018-01-30 NOTE — Patient Instructions (Signed)
Description   Spoke with pt and instructed him to continue taking 1 tablet daily except 1.5 tablets on Wednesdays and Fridays.  Recheck INR in 2 weeks.

## 2018-02-13 ENCOUNTER — Ambulatory Visit (INDEPENDENT_AMBULATORY_CARE_PROVIDER_SITE_OTHER): Payer: Medicare Other | Admitting: Pharmacist

## 2018-02-13 DIAGNOSIS — I482 Chronic atrial fibrillation, unspecified: Secondary | ICD-10-CM | POA: Diagnosis not present

## 2018-02-13 DIAGNOSIS — Z5181 Encounter for therapeutic drug level monitoring: Secondary | ICD-10-CM

## 2018-02-13 LAB — POCT INR: INR: 2.6 (ref 2.0–3.0)

## 2018-02-15 ENCOUNTER — Ambulatory Visit (INDEPENDENT_AMBULATORY_CARE_PROVIDER_SITE_OTHER): Payer: Medicare Other

## 2018-02-15 DIAGNOSIS — I482 Chronic atrial fibrillation, unspecified: Secondary | ICD-10-CM

## 2018-02-15 DIAGNOSIS — Z5181 Encounter for therapeutic drug level monitoring: Secondary | ICD-10-CM | POA: Diagnosis not present

## 2018-02-15 LAB — POCT INR: INR: 2.4 (ref 2.0–3.0)

## 2018-02-19 ENCOUNTER — Ambulatory Visit (INDEPENDENT_AMBULATORY_CARE_PROVIDER_SITE_OTHER): Payer: Medicare Other

## 2018-02-19 DIAGNOSIS — I5022 Chronic systolic (congestive) heart failure: Secondary | ICD-10-CM

## 2018-02-19 DIAGNOSIS — Z95810 Presence of automatic (implantable) cardiac defibrillator: Secondary | ICD-10-CM

## 2018-02-19 DIAGNOSIS — I255 Ischemic cardiomyopathy: Secondary | ICD-10-CM

## 2018-02-21 ENCOUNTER — Telehealth: Payer: Self-pay

## 2018-02-21 DIAGNOSIS — J40 Bronchitis, not specified as acute or chronic: Secondary | ICD-10-CM | POA: Diagnosis not present

## 2018-02-21 DIAGNOSIS — Z6825 Body mass index (BMI) 25.0-25.9, adult: Secondary | ICD-10-CM | POA: Diagnosis not present

## 2018-02-21 DIAGNOSIS — R05 Cough: Secondary | ICD-10-CM | POA: Diagnosis not present

## 2018-02-21 NOTE — Progress Notes (Signed)
EPIC Encounter for ICM Monitoring  Patient Name: [name] is a 84 y.o. male Date: 02/21/2018 Primary Care Physican: [name], MD Primary Cardiologist:Cooper Electrophysiologist: Caryl Comes Last Weight: 160lbs Today's Weight:  162 lbs                                                    Heart Failure questions reviewed, pt asymptomatic but is taking antibiotics for respiratory    Thoracic impedance normal.   Prescribed: Furosemide 20 mg 1 tablet as needed  Labs: 04/03/2017 Creatinine0.86, BUN9, Potassium 4.0, Sodium127, EGFR>60  Recommendations: No changes.    Encouraged to call for fluid symptoms.  Follow-up plan: ICM clinic phone appointment on 03/28/2018.    3 month ICM trend: 02/19/2018    1 Year ICM trend:       Rosalene Billings, RN 02/21/2018 3:08 PM

## 2018-02-21 NOTE — Progress Notes (Signed)
Remote ICD transmission.   

## 2018-02-21 NOTE — Telephone Encounter (Signed)
I did not mean to open a phone note. Sorry.

## 2018-02-22 ENCOUNTER — Ambulatory Visit (INDEPENDENT_AMBULATORY_CARE_PROVIDER_SITE_OTHER): Payer: Medicare Other | Admitting: Cardiology

## 2018-02-22 DIAGNOSIS — I482 Chronic atrial fibrillation, unspecified: Secondary | ICD-10-CM

## 2018-02-22 DIAGNOSIS — Z5181 Encounter for therapeutic drug level monitoring: Secondary | ICD-10-CM | POA: Diagnosis not present

## 2018-02-22 LAB — POCT INR: INR: 4.3 — AB (ref 2.0–3.0)

## 2018-02-26 DIAGNOSIS — M1711 Unilateral primary osteoarthritis, right knee: Secondary | ICD-10-CM | POA: Diagnosis not present

## 2018-02-27 ENCOUNTER — Ambulatory Visit (INDEPENDENT_AMBULATORY_CARE_PROVIDER_SITE_OTHER): Payer: Medicare Other | Admitting: Internal Medicine

## 2018-02-27 DIAGNOSIS — Z7901 Long term (current) use of anticoagulants: Secondary | ICD-10-CM | POA: Diagnosis not present

## 2018-02-27 DIAGNOSIS — I482 Chronic atrial fibrillation, unspecified: Secondary | ICD-10-CM | POA: Diagnosis not present

## 2018-02-27 DIAGNOSIS — Z5181 Encounter for therapeutic drug level monitoring: Secondary | ICD-10-CM

## 2018-02-27 LAB — POCT INR: INR: 4.1 — AB (ref 2.0–3.0)

## 2018-02-28 ENCOUNTER — Encounter (HOSPITAL_COMMUNITY): Payer: Self-pay

## 2018-02-28 ENCOUNTER — Other Ambulatory Visit: Payer: Self-pay

## 2018-02-28 ENCOUNTER — Emergency Department (HOSPITAL_COMMUNITY)
Admission: EM | Admit: 2018-02-28 | Discharge: 2018-02-28 | Disposition: A | Discharge status: Home / Self Care | Payer: Medicare Other | Attending: Emergency Medicine | Admitting: Emergency Medicine

## 2018-02-28 DIAGNOSIS — E039 Hypothyroidism, unspecified: Secondary | ICD-10-CM | POA: Diagnosis not present

## 2018-02-28 DIAGNOSIS — Z79899 Other long term (current) drug therapy: Secondary | ICD-10-CM | POA: Insufficient documentation

## 2018-02-28 DIAGNOSIS — I2510 Atherosclerotic heart disease of native coronary artery without angina pectoris: Secondary | ICD-10-CM | POA: Insufficient documentation

## 2018-02-28 DIAGNOSIS — I509 Heart failure, unspecified: Secondary | ICD-10-CM | POA: Diagnosis not present

## 2018-02-28 DIAGNOSIS — R339 Retention of urine, unspecified: Secondary | ICD-10-CM | POA: Insufficient documentation

## 2018-02-28 DIAGNOSIS — Z87891 Personal history of nicotine dependence: Secondary | ICD-10-CM | POA: Insufficient documentation

## 2018-02-28 DIAGNOSIS — Z7982 Long term (current) use of aspirin: Secondary | ICD-10-CM | POA: Diagnosis not present

## 2018-02-28 LAB — CBC WITH DIFFERENTIAL/PLATELET
Abs Immature Granulocytes: 0.09 10*3/uL — ABNORMAL HIGH (ref 0.00–0.07)
Basophils Absolute: 0 10*3/uL (ref 0.0–0.1)
Basophils Relative: 0 %
EOS ABS: 0.2 10*3/uL (ref 0.0–0.5)
Eosinophils Relative: 1 %
HCT: 38.4 % — ABNORMAL LOW (ref 39.0–52.0)
Hemoglobin: 12.4 g/dL — ABNORMAL LOW (ref 13.0–17.0)
Immature Granulocytes: 1 %
LYMPHS ABS: 0.5 10*3/uL — AB (ref 0.7–4.0)
Lymphocytes Relative: 4 %
MCH: 29.8 pg (ref 26.0–34.0)
MCHC: 32.3 g/dL (ref 30.0–36.0)
MCV: 92.3 fL (ref 80.0–100.0)
MONOS PCT: 8 %
Monocytes Absolute: 1 10*3/uL (ref 0.1–1.0)
Neutro Abs: 10.8 10*3/uL — ABNORMAL HIGH (ref 1.7–7.7)
Neutrophils Relative %: 86 %
Platelets: 214 10*3/uL (ref 150–400)
RBC: 4.16 MIL/uL — ABNORMAL LOW (ref 4.22–5.81)
RDW: 13.6 % (ref 11.5–15.5)
WBC: 12.6 10*3/uL — ABNORMAL HIGH (ref 4.0–10.5)
nRBC: 0 % (ref 0.0–0.2)

## 2018-02-28 LAB — COMPREHENSIVE METABOLIC PANEL
ALT: 11 U/L (ref 0–44)
AST: 13 U/L — ABNORMAL LOW (ref 15–41)
Albumin: 3.6 g/dL (ref 3.5–5.0)
Alkaline Phosphatase: 44 U/L (ref 38–126)
Anion gap: 9 (ref 5–15)
BUN: 17 mg/dL (ref 8–23)
CO2: 27 mmol/L (ref 22–32)
Calcium: 8.5 mg/dL — ABNORMAL LOW (ref 8.9–10.3)
Chloride: 89 mmol/L — ABNORMAL LOW (ref 98–111)
Creatinine, Ser: 0.85 mg/dL (ref 0.61–1.24)
GFR calc Af Amer: >60 mL/min (ref >60)
GFR calc non Af Amer: >60 mL/min (ref >60)
Glucose, Bld: 87 mg/dL (ref 70–99)
Potassium: 4.4 mmol/L (ref 3.5–5.1)
Sodium: 125 mmol/L — ABNORMAL LOW (ref 135–145)
Total Bilirubin: 1.5 mg/dL — ABNORMAL HIGH (ref 0.3–1.2)
Total Protein: 6 g/dL — ABNORMAL LOW (ref 6.5–8.1)

## 2018-02-28 LAB — URINALYSIS, ROUTINE W REFLEX MICROSCOPIC
Bilirubin Urine: NEGATIVE
Glucose, UA: NEGATIVE mg/dL
Ketones, ur: NEGATIVE mg/dL
Leukocytes, UA: NEGATIVE
Nitrite: NEGATIVE
Protein, ur: NEGATIVE mg/dL
SPECIFIC GRAVITY, URINE: 1.016 (ref 1.005–1.030)
pH: 6 (ref 5.0–8.0)

## 2018-02-28 NOTE — ED Provider Notes (Signed)
[facility] DEPT Provider Note   CSN: [id number] Arrival date & time: 02/28/18  1000     History   Chief Complaint Chief Complaint  Patient presents with  . Urinary Retention    HPI SLEVIN GUNBY is a 84 y.o. male.  The history is provided by the patient. No language interpreter was used.  Abdominal Pain   This is a new problem. The current episode started yesterday. The problem occurs constantly. The problem has been gradually worsening. The pain is located in the suprapubic region. The pain is moderate. Associated symptoms include dysuria. Nothing aggravates the symptoms. Nothing relieves the symptoms. His past medical history does not include PUD.  Pt reports he has burning when he urinates.  Pt reports he feels like his bladder is full   Past Medical History:  Diagnosis Date  . AF (atrial fibrillation) ([place])    Has not tolerated amiodarone in the past. Amiodarone was stopped in September of 2010 due to side effects  . Aortic stenosis, severe    WITH PERCUTANEOUS AORTIC VALVE (TAVI) IN March 2012 at the [facility]  . Cervical myelopathy ([place])   . CHF (congestive heart failure) ([place])   . Chronic anticoagulation    on coumadin  . Coronary artery disease   . Diverticulitis    CURRENTLY CONTROLLED WITH NO EVIDENCE OF RECURRENT INFECTION  . Esophageal stricture    WITH DILATATION  . GERD (gastroesophageal reflux disease)   . Heart murmur   . Hyperlipidemia   . Ischemic cardiomyopathy 05/2010   Has EF of 25%  . MI (myocardial infarction) ([place]) 1974, 1978  . NSVT (nonsustained ventricular tachycardia) ([place])   . Osteoarthritis    RIGHT KNEE  . Osteoporosis   . Prostate cancer ([place])   . Pulmonary embolism ([place])   . Stroke [facility])    3 strokes    Patient Active Problem List   Diagnosis Date Noted  . Pressure injury of skin 01/21/2017  . Acute urinary retention 01/21/2017  . Shortness of breath   . Weakness 09/01/2015  .  Hyponatremia 09/01/2015  . Hypothyroidism 09/01/2015  . DOE (dyspnea on exertion) 09/01/2015  . Chronic systolic CHF (congestive heart failure) ([place]) 09/01/2015  . Exertional dyspnea 09/01/2015  . GI bleed 01/16/2014  . GIB (gastrointestinal bleeding) 01/16/2014  . Encounter for therapeutic drug monitoring 05/09/2013  . Automatic implantable cardioverter-defibrillator in situ 12/13/2012  . Rectal bleed 05/30/2012    Class: Acute  . Acute low back pain 05/30/2012    Class: Acute  . Hypotension 05/30/2012    Class: Acute  . Vertebral fracture, osteoporotic ([place]) 05/21/2012  . Gait instability 12/02/2011    Class: Acute  . Atrial fibrillation, chronic 12/02/2011    Class: Chronic  . Anticoagulated on Coumadin 12/02/2011  . Deformity, finger acquired 12/19/2010  . Chronic cough 12/05/2010  . S/P aortic valve replacement 10/22/2010  . Ischemic cardiomyopathy   . Diverticulitis   . Prostate cancer ([place])   . Osteoarthritis   . Cervical myelopathy ([place])   . Pulmonary embolism ([place])   . Hyperlipidemia   . GERD (gastroesophageal reflux disease)   . Esophageal stricture   . Indigestion 06/29/2010  . VENTRICULAR TACHYCARDIA 01/15/2009    Past Surgical History:  Procedure Laterality Date  . AORTIC VALVE REPLACEMENT     Percutaneous AVR in March 2012 at the [facility]  . BACK SURGERY    . CARDIAC CATHETERIZATION  2010   SEVERE LV DYSFUNCTION WITH ESTIMATED  EJECTION FRACTION OF 25%  . CARDIOVERSION  02/16/2011   Procedure: CARDIOVERSION;  Surgeon: Carlena Bjornstad, MD;  Location: [facility];  Service: Cardiovascular;  Laterality: N/A;  . CHOLECYSTECTOMY    . CORONARY ARTERY BYPASS GRAFT  1979  . CORONARY ARTERY BYPASS GRAFT  1991   REDO SURGERY  . ICD  Feb 2003; 02/2013   gen change 02-11-2013 by Dr Caryl Comes  . IMPLANTABLE CARDIOVERTER DEFIBRILLATOR (ICD) GENERATOR CHANGE N/A 02/10/2013   Procedure: ICD GENERATOR CHANGE;  Surgeon: Deboraha Sprang, MD;  Location: [facility] CATH LAB;  Service:  Cardiovascular;  Laterality: N/A;  . SHOULDER SURGERY          Home Medications    Prior to Admission medications   Medication Sig Start Date End Date Taking? Authorizing Provider  acetaminophen (TYLENOL) 500 MG tablet Take 1,000 mg by mouth every 6 (six) hours as needed for mild pain.     [provider]  albuterol (PROVENTIL HFA;VENTOLIN HFA) 108 (90 Base) MCG/ACT inhaler Inhale 1-2 puffs into the lungs every 6 (six) hours as needed for wheezing or shortness of breath.    [provider]  alfuzosin (UROXATRAL) 10 MG 24 hr tablet Take 10 mg by mouth at bedtime.     [provider]  aspirin 81 MG tablet Take 81 mg by mouth daily.    [provider]  atorvastatin (LIPITOR) 10 MG tablet Take 10 mg by mouth every other day.    [provider]  benzonatate (TESSALON) 100 MG capsule Take 100 mg by mouth 3 (three) times daily as needed for cough.    [provider]  Budesonide-Formoterol Fumarate (SYMBICORT IN) Inhale 1 puff into the lungs 2 (two) times daily.    [provider]  carvedilol (COREG) 6.25 MG tablet TAKE 1 TABLET BY MOUTH TWICE DAILY WITH A MEAL. 01/07/18   Sherren Mocha, MD  cholecalciferol (VITAMIN D) 1000 UNITS tablet Take 1,000 Units by mouth 2 (two) times daily.    [provider]  COUMADIN 5 MG tablet TAKE AS DIRECTED BY COUMADIN CLINIC. 12/17/17   Sherren Mocha, MD  dicyclomine (BENTYL) 20 MG tablet Take 1 tablet (20 mg total) by mouth 2 (two) times daily as needed for spasms. 07/06/16   Carlisle Cater, PA-C  furosemide (LASIX) 20 MG tablet TAKE (1) TABLET DAILY AS NEEDED FOR SWELLING. 06/19/16   Sherren Mocha, MD  ipratropium (ATROVENT) 0.03 % nasal spray Place 2 sprays into both nostrils 2 (two) times daily.    [provider]  lactulose (CEPHULAC) 10 g packet Take 1 packet (10 g total) daily as needed by mouth (for severe constipation). 01/22/17   Dessa Phi, DO  levothyroxine (SYNTHROID)  125 MCG tablet Take 1 tablet (125 mcg total) by mouth daily before breakfast. 09/02/15   Hosie Poisson, MD  linaclotide (LINZESS) 145 MCG CAPS capsule Take 145 mcg by mouth daily before breakfast.    [provider]  nitroGLYCERIN (NITROSTAT) 0.4 MG SL tablet Place 1 tablet (0.4 mg total) under the tongue every 5 (five) minutes x 3 doses as needed for chest pain (Max 3 doses in 15 min. Call 911). 12/06/16   Sherren Mocha, MD  omeprazole (PRILOSEC) 20 MG capsule Take 20 mg by mouth daily as needed (acid reflux).    [provider]  polyethylene glycol (MIRALAX / GLYCOLAX) packet Take 17 g by mouth daily as needed for mild constipation.     [provider]  Probiotic Product (PROBIOTIC DAILY PO)  Take 1 capsule by mouth daily.    [provider]  RESTASIS 0.05 % ophthalmic emulsion Place 1 drop into both eyes 2 (two) times daily as needed (dry eyes).  12/05/11   [provider]  traMADol (ULTRAM) 50 MG tablet Take 50 mg by mouth every 6 (six) hours as needed for moderate pain.    [provider]    Family History Family History  Problem Relation Age of Onset  . Heart disease Mother 55  . Diabetes Mother 63  . Heart disease Father 69    Social History Social History   Tobacco Use  . Smoking status: Former Smoker    Packs/day: 4.00    Years: 15.00    Pack years: 60.00    Types: Cigarettes    Last attempt to quit: 06/15/1969    Years since quitting: 48.7  . Smokeless tobacco: Never Used  Substance Use Topics  . Alcohol use: No  . Drug use: No     Allergies   Penicillins; Tizanidine; Ace inhibitors; Antihistamines, diphenhydramine-type; Clemastine fumarate; Dimenhydrinate; and Amiodarone   Review of Systems Review of Systems  Gastrointestinal: Positive for abdominal pain.  Genitourinary: Positive for dysuria.  All other systems reviewed and are negative.    Physical Exam Updated Vital Signs BP 121/72 (BP Location: Right  Arm)   Pulse 88   Temp (!) 97.3 F (36.3 C) (Oral)   Resp 16   Ht 6\' 2"  (1.88 m)   Wt 76.3 kg   SpO2 98%   BMI 21.59 kg/m   Physical Exam Vitals signs and nursing note reviewed.  Constitutional:      Appearance: He is well-developed.  HENT:     Head: Normocephalic and atraumatic.     Nose: Nose normal.     Mouth/Throat:     Mouth: Mucous membranes are moist.  Eyes:     Conjunctiva/sclera: Conjunctivae normal.  Neck:     Musculoskeletal: Neck supple.  Cardiovascular:     Rate and Rhythm: Normal rate and regular rhythm.     Heart sounds: No murmur.     Comments: Tender suprapubic  Pulmonary:     Effort: Pulmonary effort is normal. No respiratory distress.     Breath sounds: Normal breath sounds.  Abdominal:     Palpations: Abdomen is soft.     Tenderness: There is no abdominal tenderness.  Musculoskeletal: Normal range of motion.  Skin:    General: Skin is warm and dry.  Neurological:     General: No focal deficit present.     Mental Status: He is alert.      ED Treatments / Results  Labs (all labs ordered are listed, but only abnormal results are displayed) Labs Reviewed  CBC WITH DIFFERENTIAL/PLATELET  COMPREHENSIVE METABOLIC PANEL  URINALYSIS, ROUTINE W REFLEX MICROSCOPIC    EKG None  Radiology No results found.  Procedures Procedures (including critical care time)  Medications Ordered in ED Medications - No data to display   Initial Impression / Assessment and Plan / ED Course  I have reviewed the triage vital signs and the nursing notes.  Pertinent labs & imaging results that were available during my care of the patient were reviewed by me and considered in my medical decision making (see chart for details).     MDM  Bladder scan shows over 444ml.  Foley placed  Pt advised to follow up with Urology for evaluation   Final Clinical Impressions(s) / ED Diagnoses   Final diagnoses:  Urinary retention    ED Discharge Orders    None      An After Visit Summary was printed and given to the patient.    Sidney Ace 02/28/18 [place], MD 02/28/18 [phone]

## 2018-02-28 NOTE — ED Triage Notes (Signed)
Pt c/o of not urinating in 24 hours.  Pt states he has been drinking plenty of fluids.  Pt is retaining 412 mL at this time.  Pt reports tenderness in lower abdomen with palpation at this time.

## 2018-02-28 NOTE — ED Notes (Signed)
ED Provider at bedside. 

## 2018-02-28 NOTE — ED Notes (Signed)
Pt urinated 50 mL

## 2018-02-28 NOTE — Discharge Instructions (Signed)
Schedule follow up with Urology

## 2018-03-04 DIAGNOSIS — R339 Retention of urine, unspecified: Secondary | ICD-10-CM | POA: Diagnosis not present

## 2018-03-04 DIAGNOSIS — Z8546 Personal history of malignant neoplasm of prostate: Secondary | ICD-10-CM | POA: Diagnosis not present

## 2018-03-07 ENCOUNTER — Ambulatory Visit (INDEPENDENT_AMBULATORY_CARE_PROVIDER_SITE_OTHER): Payer: Medicare Other | Admitting: Internal Medicine

## 2018-03-07 DIAGNOSIS — I482 Chronic atrial fibrillation, unspecified: Secondary | ICD-10-CM

## 2018-03-07 DIAGNOSIS — Z5181 Encounter for therapeutic drug level monitoring: Secondary | ICD-10-CM | POA: Diagnosis not present

## 2018-03-07 LAB — POCT INR: INR: 2.3 (ref 2.0–3.0)

## 2018-03-07 NOTE — Patient Instructions (Signed)
Description   Spoke with pt and instructed him to take 1.5 tablets today then continue taking 1 tablet daily except 1.5 tablets on Wednesdays and Fridays.  Recheck INR in 1 week.

## 2018-03-14 ENCOUNTER — Ambulatory Visit (INDEPENDENT_AMBULATORY_CARE_PROVIDER_SITE_OTHER): Payer: Medicare Other | Admitting: Internal Medicine

## 2018-03-14 DIAGNOSIS — I482 Chronic atrial fibrillation, unspecified: Secondary | ICD-10-CM | POA: Diagnosis not present

## 2018-03-14 DIAGNOSIS — Z5181 Encounter for therapeutic drug level monitoring: Secondary | ICD-10-CM

## 2018-03-14 LAB — POCT INR: INR: 2.6 (ref 2.0–3.0)

## 2018-03-18 ENCOUNTER — Other Ambulatory Visit: Payer: Self-pay | Admitting: Cardiovascular Disease

## 2018-03-19 DIAGNOSIS — Z466 Encounter for fitting and adjustment of urinary device: Secondary | ICD-10-CM | POA: Diagnosis not present

## 2018-03-19 DIAGNOSIS — R339 Retention of urine, unspecified: Secondary | ICD-10-CM | POA: Diagnosis not present

## 2018-03-21 ENCOUNTER — Ambulatory Visit (INDEPENDENT_AMBULATORY_CARE_PROVIDER_SITE_OTHER): Payer: Medicare Other | Admitting: Interventional Cardiology

## 2018-03-21 DIAGNOSIS — I482 Chronic atrial fibrillation, unspecified: Secondary | ICD-10-CM | POA: Diagnosis not present

## 2018-03-21 DIAGNOSIS — Z5181 Encounter for therapeutic drug level monitoring: Secondary | ICD-10-CM

## 2018-03-21 LAB — POCT INR: INR: 3.8 — AB (ref 2.0–3.0)

## 2018-03-21 NOTE — Patient Instructions (Signed)
Description   Spoke with pt and instructed to hold today's dose then continue taking 1 tablet daily except 1.5 tablets on Wednesdays and Fridays.  Recheck INR in 1 week.

## 2018-03-22 DIAGNOSIS — R399 Unspecified symptoms and signs involving the genitourinary system: Secondary | ICD-10-CM | POA: Diagnosis not present

## 2018-03-28 ENCOUNTER — Ambulatory Visit (INDEPENDENT_AMBULATORY_CARE_PROVIDER_SITE_OTHER): Payer: Medicare Other

## 2018-03-28 ENCOUNTER — Ambulatory Visit (INDEPENDENT_AMBULATORY_CARE_PROVIDER_SITE_OTHER): Payer: Medicare Other | Admitting: Cardiovascular Disease

## 2018-03-28 ENCOUNTER — Telehealth: Payer: Self-pay

## 2018-03-28 DIAGNOSIS — M79675 Pain in left toe(s): Secondary | ICD-10-CM | POA: Diagnosis not present

## 2018-03-28 DIAGNOSIS — Z5181 Encounter for therapeutic drug level monitoring: Secondary | ICD-10-CM | POA: Diagnosis not present

## 2018-03-28 DIAGNOSIS — Z7901 Long term (current) use of anticoagulants: Secondary | ICD-10-CM | POA: Diagnosis not present

## 2018-03-28 DIAGNOSIS — I5022 Chronic systolic (congestive) heart failure: Secondary | ICD-10-CM | POA: Diagnosis not present

## 2018-03-28 DIAGNOSIS — I482 Chronic atrial fibrillation, unspecified: Secondary | ICD-10-CM

## 2018-03-28 DIAGNOSIS — Z95810 Presence of automatic (implantable) cardiac defibrillator: Secondary | ICD-10-CM | POA: Diagnosis not present

## 2018-03-28 DIAGNOSIS — M79674 Pain in right toe(s): Secondary | ICD-10-CM | POA: Diagnosis not present

## 2018-03-28 DIAGNOSIS — B351 Tinea unguium: Secondary | ICD-10-CM | POA: Diagnosis not present

## 2018-03-28 DIAGNOSIS — I739 Peripheral vascular disease, unspecified: Secondary | ICD-10-CM | POA: Diagnosis not present

## 2018-03-28 LAB — POCT INR: INR: 3.3 — AB (ref 2.0–3.0)

## 2018-03-28 NOTE — Patient Instructions (Signed)
Description   Spoke with pt and instructed to hold today's dosage of Coumadin, then start taking 1 tablet daily except 1.5 tablets on Wednesdays.  Recheck INR in 1 week.

## 2018-03-28 NOTE — Telephone Encounter (Signed)
Pt would like for [name] to give him a call back. He stated that he felt a little weak the past couple of days. He wanted to know if that had something to do with the episode he had.

## 2018-03-28 NOTE — Telephone Encounter (Signed)
Informed pt that I would inform Dr.Klein of this as well.

## 2018-03-28 NOTE — Telephone Encounter (Signed)
Spoke with pt regarding ATP episodes from 03/23/2018 at 8:18am pt didn't recall any symptoms pt reported compliance with medications, pt aware of driving restrictions x 6 months, Informed pt that I would call him back if SK had any further recommendations pt voiced understanding.

## 2018-03-29 ENCOUNTER — Telehealth: Payer: Self-pay

## 2018-03-29 NOTE — Progress Notes (Signed)
EPIC Encounter for ICM Monitoring  Patient Name: [name] is a 85 y.o. male Date: 03/29/2018 Primary Care Physican: [name], MD Primary Cardiologist:Cooper Electrophysiologist: Sierra Brooks with wife.  Heart Failure questions reviewed, pt asymptomatic.  She said patient is weak and stays in bed a lot.  She received a call from [facility] in [place] clinic yesterday regarding patient received ATP.  She would like a return call today to ask questions and to check what Dr Olin Pia recommendations are.  Message sent to device clinic triage.   Thoracic impedance normal.   Prescribed:Furosemide 20 mg 1 tablet as needed  Labs: 04/03/2017 Creatinine0.86, BUN9, Potassium 4.0, Sodium127, EGFR>60  Recommendations:No changes. Encouraged to call for fluid symptoms.  Follow-up plan: ICM clinic phone appointment on2/17/2020.   Copy of ICM check sent to Dr. Caryl Comes.   3 month ICM trend: 03/28/2018    1 Year ICM trend:       Rosalene Billings, RN 03/29/2018 3:12 PM

## 2018-03-29 NOTE — Telephone Encounter (Signed)
Spoke with pts wife (ok per DPR) informed her that Dr. Caryl Comes had reviewed the episode pt had did not recommend any changes at this time, pts wife confused about why pt didn't feel anything informed her that pt didn't receive a shock that pts ICD paced him out of a fast rhythm, pts wife voiced understanding

## 2018-03-31 LAB — CUP PACEART REMOTE DEVICE CHECK
Battery Remaining Longevity: 88 mo
Battery Voltage: 3.01 V
Brady Statistic RV Percent Paced: 4.99 %
Date Time Interrogation Session: 20191210072603
HIGH POWER IMPEDANCE MEASURED VALUE: 46 Ohm
HIGH POWER IMPEDANCE MEASURED VALUE: 58 Ohm
Implantable Lead Implant Date: 20021220
Implantable Pulse Generator Implant Date: 20141201
Lead Channel Impedance Value: 475 Ohm
Lead Channel Impedance Value: 589 Ohm
Lead Channel Pacing Threshold Amplitude: 1.125 V
Lead Channel Pacing Threshold Pulse Width: 0.4 ms
Lead Channel Sensing Intrinsic Amplitude: 19.375 mV
Lead Channel Sensing Intrinsic Amplitude: 19.375 mV
Lead Channel Setting Pacing Amplitude: 2.25 V
MDC IDC LEAD LOCATION: 753860
MDC IDC SET LEADCHNL RV PACING PULSEWIDTH: 0.4 ms
MDC IDC SET LEADCHNL RV SENSING SENSITIVITY: 0.3 mV

## 2018-04-02 ENCOUNTER — Other Ambulatory Visit: Payer: Self-pay | Admitting: Internal Medicine

## 2018-04-03 NOTE — Telephone Encounter (Signed)
noted 

## 2018-04-04 DIAGNOSIS — R05 Cough: Secondary | ICD-10-CM | POA: Diagnosis not present

## 2018-04-04 DIAGNOSIS — K219 Gastro-esophageal reflux disease without esophagitis: Secondary | ICD-10-CM | POA: Diagnosis not present

## 2018-04-04 DIAGNOSIS — R399 Unspecified symptoms and signs involving the genitourinary system: Secondary | ICD-10-CM | POA: Diagnosis not present

## 2018-04-04 DIAGNOSIS — R1314 Dysphagia, pharyngoesophageal phase: Secondary | ICD-10-CM | POA: Diagnosis not present

## 2018-04-05 ENCOUNTER — Other Ambulatory Visit: Payer: Self-pay | Admitting: Otolaryngology

## 2018-04-05 DIAGNOSIS — R1314 Dysphagia, pharyngoesophageal phase: Secondary | ICD-10-CM

## 2018-04-09 ENCOUNTER — Ambulatory Visit
Admission: RE | Admit: 2018-04-09 | Discharge: 2018-04-09 | Disposition: A | Discharge status: Home / Self Care | Payer: Medicare Other | Source: Ambulatory Visit | Attending: Otolaryngology | Admitting: Otolaryngology

## 2018-04-09 ENCOUNTER — Other Ambulatory Visit: Payer: Self-pay | Admitting: Otolaryngology

## 2018-04-09 DIAGNOSIS — R1314 Dysphagia, pharyngoesophageal phase: Secondary | ICD-10-CM

## 2018-04-09 DIAGNOSIS — R1310 Dysphagia, unspecified: Secondary | ICD-10-CM | POA: Diagnosis not present

## 2018-04-10 ENCOUNTER — Ambulatory Visit (INDEPENDENT_AMBULATORY_CARE_PROVIDER_SITE_OTHER): Payer: Medicare Other | Admitting: Internal Medicine

## 2018-04-10 DIAGNOSIS — I482 Chronic atrial fibrillation, unspecified: Secondary | ICD-10-CM | POA: Diagnosis not present

## 2018-04-10 DIAGNOSIS — L988 Other specified disorders of the skin and subcutaneous tissue: Secondary | ICD-10-CM | POA: Diagnosis not present

## 2018-04-10 DIAGNOSIS — Z5181 Encounter for therapeutic drug level monitoring: Secondary | ICD-10-CM

## 2018-04-10 DIAGNOSIS — I4821 Permanent atrial fibrillation: Secondary | ICD-10-CM | POA: Diagnosis not present

## 2018-04-10 DIAGNOSIS — Z7901 Long term (current) use of anticoagulants: Secondary | ICD-10-CM | POA: Diagnosis not present

## 2018-04-10 DIAGNOSIS — Z6823 Body mass index (BMI) 23.0-23.9, adult: Secondary | ICD-10-CM | POA: Diagnosis not present

## 2018-04-10 DIAGNOSIS — M722 Plantar fascial fibromatosis: Secondary | ICD-10-CM | POA: Diagnosis not present

## 2018-04-10 LAB — POCT INR: INR: 4 — AB (ref 2.0–3.0)

## 2018-04-17 DIAGNOSIS — D225 Melanocytic nevi of trunk: Secondary | ICD-10-CM | POA: Diagnosis not present

## 2018-04-17 DIAGNOSIS — L570 Actinic keratosis: Secondary | ICD-10-CM | POA: Diagnosis not present

## 2018-04-17 DIAGNOSIS — L821 Other seborrheic keratosis: Secondary | ICD-10-CM | POA: Diagnosis not present

## 2018-04-17 DIAGNOSIS — D1801 Hemangioma of skin and subcutaneous tissue: Secondary | ICD-10-CM | POA: Diagnosis not present

## 2018-04-17 DIAGNOSIS — Z85828 Personal history of other malignant neoplasm of skin: Secondary | ICD-10-CM | POA: Diagnosis not present

## 2018-04-18 ENCOUNTER — Ambulatory Visit: Payer: Medicare Other | Admitting: Cardiovascular Disease

## 2018-04-18 ENCOUNTER — Telehealth: Payer: Self-pay | Admitting: Cardiovascular Disease

## 2018-04-18 DIAGNOSIS — M7022 Olecranon bursitis, left elbow: Secondary | ICD-10-CM | POA: Diagnosis not present

## 2018-04-18 DIAGNOSIS — M1711 Unilateral primary osteoarthritis, right knee: Secondary | ICD-10-CM | POA: Diagnosis not present

## 2018-04-18 NOTE — Telephone Encounter (Signed)
[name] cancelled his appointment today due to weather. Rescheduled him to 2/26 with Dr. Burt Knack.  While on the phone, his wife states she is upset that they were not contacted until 4 days after he was shocked and she has questions about what to do now. Informed her that someone from the Device Team will call to answer her concerns.  She requests a call ASAP on [phone].

## 2018-04-18 NOTE — Telephone Encounter (Signed)
See phone notes from 03/28/18 and 03/29/18.  Spoke with patient and wife. Reiterated that patient was not shocked by his ICD, but did have a VT episode on 03/23/18 that was terminated with ATP x1 (shock diverted). Explained that ATP is painless and that patient may not have had symptoms with episode (as reported per 03/28/18 phone note). Advised that this is still considered "therapy" from patient's ICD so driving restrictions x6 months still apply. Discussed monitoring and explained that shocks typically transmit automatically but ATP episodes do not. Discussed shock plan. Patient and wife verbalize understanding of information and deny additional questions at this time. Patient thanked me for my call.

## 2018-04-18 NOTE — Telephone Encounter (Signed)
New message     Pt stated that he has questions about not being able to drive for 6 months . Nothing else was said

## 2018-04-19 ENCOUNTER — Ambulatory Visit (INDEPENDENT_AMBULATORY_CARE_PROVIDER_SITE_OTHER): Payer: Medicare Other | Admitting: Cardiology

## 2018-04-19 DIAGNOSIS — I482 Chronic atrial fibrillation, unspecified: Secondary | ICD-10-CM | POA: Diagnosis not present

## 2018-04-19 DIAGNOSIS — Z5181 Encounter for therapeutic drug level monitoring: Secondary | ICD-10-CM | POA: Diagnosis not present

## 2018-04-19 LAB — POCT INR: INR: 2.9 (ref 2.0–3.0)

## 2018-04-19 NOTE — Patient Instructions (Signed)
Description   Spoke with pt & wife and instructed pt to continue taking 1 tablet daily except 1.5 tablets only on  Wednesdays. Wife fixes meds-speak with her. Recheck INR in 1 week. Coumadin Clinic [phone]

## 2018-04-23 ENCOUNTER — Other Ambulatory Visit (HOSPITAL_COMMUNITY): Payer: Self-pay

## 2018-04-23 DIAGNOSIS — R1310 Dysphagia, unspecified: Secondary | ICD-10-CM

## 2018-04-26 ENCOUNTER — Ambulatory Visit (INDEPENDENT_AMBULATORY_CARE_PROVIDER_SITE_OTHER): Payer: Medicare Other | Admitting: Internal Medicine

## 2018-04-26 DIAGNOSIS — I482 Chronic atrial fibrillation, unspecified: Secondary | ICD-10-CM

## 2018-04-26 DIAGNOSIS — Z5181 Encounter for therapeutic drug level monitoring: Secondary | ICD-10-CM | POA: Diagnosis not present

## 2018-04-26 LAB — POCT INR: INR: 3.1 — AB (ref 2.0–3.0)

## 2018-04-26 NOTE — Patient Instructions (Signed)
Description   Spoke with pt and instructed pt to take 1/2 tablet today, then continue taking 1 tablet daily except 1.5 tablets only on  Wednesdays. Wife fixes meds-speak with her. Tried to call wife however pt answered again and stated wife is not home. Repeated Coumadin instructions for pt multiple times. Recheck INR in 2 weeks. Coumadin Clinic [phone]

## 2018-04-29 ENCOUNTER — Ambulatory Visit (INDEPENDENT_AMBULATORY_CARE_PROVIDER_SITE_OTHER): Payer: Medicare Other

## 2018-04-29 DIAGNOSIS — I5022 Chronic systolic (congestive) heart failure: Secondary | ICD-10-CM

## 2018-04-29 DIAGNOSIS — Z95810 Presence of automatic (implantable) cardiac defibrillator: Secondary | ICD-10-CM

## 2018-04-30 DIAGNOSIS — M1711 Unilateral primary osteoarthritis, right knee: Secondary | ICD-10-CM | POA: Diagnosis not present

## 2018-04-30 DIAGNOSIS — M7022 Olecranon bursitis, left elbow: Secondary | ICD-10-CM | POA: Diagnosis not present

## 2018-05-01 NOTE — Progress Notes (Signed)
EPIC Encounter for ICM Monitoring  Patient Name: [name] is a 85 y.o. male Date: 05/01/2018 Primary Care Physican: [name], MD Primary Cardiologist:Cooper Electrophysiologist: Caryl Comes Last Weight:160lbs Today's Weight:160 lbs   Heart Failure questions reviewed, pt asymptomatic.  He denies fluid symptoms.  Thoracic impedance close to baseline normal.   Prescribed:Furosemide 20 mg 1 tablet as needed.  Has not needed any Furosemide in last month.  Labs: 03/01/2019 Creatinine 0.85, BUN 17, Potassium 4.4, Sodium 125, GFR >60 04/03/2017 Creatinine0.86, BUN9, Potassium 4.0, Sodium127, GFR>60  Recommendations:No changes. Encouraged to call for fluid symptoms.  Follow-up plan: ICM clinic phone appointment on3/23/2020.   Office appt 05/08/2018 with Dr. Burt Knack.    Copy of ICM check sent to Dr. Caryl Comes.   3 month ICM trend: 04/29/2018    1 Year ICM trend:       Rosalene Billings, RN 05/01/2018 9:32 AM

## 2018-05-08 ENCOUNTER — Encounter: Payer: Self-pay | Admitting: Cardiovascular Disease

## 2018-05-08 ENCOUNTER — Ambulatory Visit (INDEPENDENT_AMBULATORY_CARE_PROVIDER_SITE_OTHER): Payer: Medicare Other | Admitting: Cardiovascular Disease

## 2018-05-08 VITALS — BP 104/70 | HR 65 | Ht 74.0 in | Wt 165.8 lb

## 2018-05-08 DIAGNOSIS — I472 Ventricular tachycardia, unspecified: Secondary | ICD-10-CM

## 2018-05-08 DIAGNOSIS — I5022 Chronic systolic (congestive) heart failure: Secondary | ICD-10-CM

## 2018-05-08 DIAGNOSIS — I4821 Permanent atrial fibrillation: Secondary | ICD-10-CM | POA: Diagnosis not present

## 2018-05-08 DIAGNOSIS — I359 Nonrheumatic aortic valve disorder, unspecified: Secondary | ICD-10-CM | POA: Diagnosis not present

## 2018-05-08 NOTE — Patient Instructions (Signed)
Medication Instructions:  Your provider recommends that you continue on your current medications as directed. Please refer to the Current Medication list given to you today.    Labwork: None  Testing/Procedures: None  Follow-Up: Your provider wants you to follow-up in: 4 months with Dr. Burt Knack. You will receive a reminder letter in the mail two months in advance. If you don't receive a letter, please call our office to schedule the follow-up appointment.

## 2018-05-08 NOTE — Progress Notes (Signed)
Cardiology Office Note:    Date:  05/09/2018   ID:  [name], DOB 01/30/1933, MRN [id number]  PCP:  [name], MD  Cardiologist:  Sherren Mocha, MD  Electrophysiologist:  None   Referring MD: [name], MD   Chief Complaint  Patient presents with  . Follow-up    CHF    History of Present Illness:    [name] is a 85 y.o. male with a hx of coronary artery disease status post CABG in 1979 and redo CABG in 1991.  He has long-standing severe ischemic cardiomyopathy.  The patient underwent TAVR at the [facility] clinic in 2012 and has been followed for mild to moderate paravalvular aortic insufficiency since that time.  He is also followed for permanent atrial fibrillation maintained on long-term anticoagulation.  He has not been able to tolerate heart failure medication because of low blood pressure and frailty.  The patient is here with his wife today.  He is having problems with aspiration and he reports frequent coughing when he eats.  He is scheduled to have a barium swallow tomorrow.  He remains generally weak and unsteady.  He ambulates with a walker at all times.  From a cardiac perspective, he reports no symptoms of chest pain or shortness of breath.  He denies leg swelling, orthopnea, or PND.  He has had no lightheadedness or heart palpitations.  He did have an episode of antitachycardia pacing for treatment of VT.  He paced out of VT without receiving a shock.  He was notified by the device clinic that he cannot drive for 6 months.  He has not driven in some time anyway.  He is still concerned about the event and has a lot of questions related to this today.  Past Medical History:  Diagnosis Date  . AF (atrial fibrillation) ([place])    Has not tolerated amiodarone in the past. Amiodarone was stopped in September of 2010 due to side effects  . Aortic stenosis, severe    WITH PERCUTANEOUS AORTIC VALVE (TAVI) IN March 2012 at the [facility]  . Cervical myelopathy  ([place])   . CHF (congestive heart failure) ([place])   . Chronic anticoagulation    on coumadin  . Coronary artery disease   . Diverticulitis    CURRENTLY CONTROLLED WITH NO EVIDENCE OF RECURRENT INFECTION  . Esophageal stricture    WITH DILATATION  . GERD (gastroesophageal reflux disease)   . Heart murmur   . Hyperlipidemia   . Ischemic cardiomyopathy 05/2010   Has EF of 25%  . MI (myocardial infarction) ([place]) 1974, 1978  . NSVT (nonsustained ventricular tachycardia) ([place])   . Osteoarthritis    RIGHT KNEE  . Osteoporosis   . Prostate cancer ([place])   . Pulmonary embolism ([place])   . Stroke [facility])    3 strokes    Past Surgical History:  Procedure Laterality Date  . AORTIC VALVE REPLACEMENT     Percutaneous [facility] in March 2012 at [facility]  . BACK SURGERY    . CARDIAC CATHETERIZATION  2010   SEVERE LV DYSFUNCTION WITH ESTIMATED EJECTION FRACTION OF 25%  . CARDIOVERSION  02/16/2011   Procedure: CARDIOVERSION;  Surgeon: Carlena Bjornstad, MD;  Location: [place];  Service: Cardiovascular;  Laterality: N/A;  . CHOLECYSTECTOMY    . CORONARY ARTERY BYPASS GRAFT  1979  . CORONARY ARTERY BYPASS GRAFT  1991   REDO SURGERY  . ICD  Feb 2003; 02/2013   gen change 02-11-2013  by Dr Caryl Comes  . IMPLANTABLE CARDIOVERTER DEFIBRILLATOR (ICD) GENERATOR CHANGE N/A 02/10/2013   Procedure: ICD GENERATOR CHANGE;  Surgeon: Deboraha Sprang, MD;  Location: [facility] CATH LAB;  Service: Cardiovascular;  Laterality: N/A;  . SHOULDER SURGERY      Current Medications: Current Meds  Medication Sig  . albuterol (PROVENTIL HFA;VENTOLIN HFA) 108 (90 Base) MCG/ACT inhaler Inhale 1-2 puffs into the lungs every 6 (six) hours as needed for wheezing or shortness of breath.  . alfuzosin (UROXATRAL) 10 MG 24 hr tablet Take 10 mg by mouth at bedtime.   Marland Kitchen aspirin 81 MG tablet Take 81 mg by mouth daily.  Marland Kitchen atorvastatin (LIPITOR) 10 MG tablet Take 10 mg by mouth every other day.  . benzonatate (TESSALON) 100 MG capsule Take 100 mg  by mouth 3 (three) times daily as needed for cough.  . Budesonide-Formoterol Fumarate (SYMBICORT IN) Inhale 1 puff into the lungs 2 (two) times daily.  . carvedilol (COREG) 6.25 MG tablet TAKE 1 TABLET BY MOUTH TWICE DAILY WITH A MEAL.  . cholecalciferol (VITAMIN D) 1000 UNITS tablet Take 1,000 Units by mouth 2 (two) times daily.  Marland Kitchen COUMADIN 5 MG tablet TAKE AS DIRECTED BY COUMADIN CLINIC.  Marland Kitchen dicyclomine (BENTYL) 20 MG tablet Take 1 tablet (20 mg total) by mouth 2 (two) times daily as needed for spasms.  . furosemide (LASIX) 20 MG tablet TAKE (1) TABLET DAILY AS NEEDED FOR SWELLING.  Marland Kitchen ipratropium (ATROVENT) 0.03 % nasal spray Place 2 sprays into both nostrils 2 (two) times daily.  Marland Kitchen lactulose (CEPHULAC) 10 g packet Take 1 packet (10 g total) daily as needed by mouth (for severe constipation).  Marland Kitchen levothyroxine (SYNTHROID) 125 MCG tablet Take 1 tablet (125 mcg total) by mouth daily before breakfast.  . linaclotide (LINZESS) 145 MCG CAPS capsule Take 145 mcg by mouth daily before breakfast.  . nitroGLYCERIN (NITROSTAT) 0.4 MG SL tablet Place 1 tablet (0.4 mg total) under the tongue every 5 (five) minutes x 3 doses as needed for chest pain (Max 3 doses in 15 min. Call 911).  Marland Kitchen omeprazole (PRILOSEC) 20 MG capsule Take 20 mg by mouth daily as needed (acid reflux).  . polyethylene glycol (MIRALAX / GLYCOLAX) packet Take 17 g by mouth daily as needed for mild constipation.   . Probiotic Product (PROBIOTIC DAILY PO) Take 1 capsule by mouth daily.  . RESTASIS 0.05 % ophthalmic emulsion Place 1 drop into both eyes 2 (two) times daily as needed (dry eyes).   . traMADol (ULTRAM) 50 MG tablet Take 50 mg by mouth every 6 (six) hours as needed for moderate pain.     Allergies:   Penicillins; Tizanidine; Ace inhibitors; Antihistamines, diphenhydramine-type; Clemastine fumarate; Dimenhydrinate; and Amiodarone   Social History   Socioeconomic History  . Marital status: Married    Spouse name: Not on file  .  Number of children: Y  . Years of education: Not on file  . Highest education level: Not on file  Occupational History  . Occupation: retired. developer.     Employer: RETIRED  Social Needs  . Financial resource strain: Not on file  . Food insecurity:    Worry: Not on file    Inability: Not on file  . Transportation needs:    Medical: Not on file    Non-medical: Not on file  Tobacco Use  . Smoking status: Former Smoker    Packs/day: 4.00    Years: 15.00    Pack years: 60.00  Types: Cigarettes    Last attempt to quit: 06/15/1969    Years since quitting: 48.9  . Smokeless tobacco: Never Used  Substance and Sexual Activity  . Alcohol use: No  . Drug use: No  . Sexual activity: Yes  Lifestyle  . Physical activity:    Days per week: Not on file    Minutes per session: Not on file  . Stress: Not on file  Relationships  . Social connections:    Talks on phone: Not on file    Gets together: Not on file    Attends religious service: Not on file    Active member of club or organization: Not on file    Attends meetings of clubs or organizations: Not on file    Relationship status: Not on file  Other Topics Concern  . Not on file  Social History Narrative  . Not on file     Family History: The patient's family history includes Diabetes (age of onset: 91) in his mother; Heart disease (age of onset: 15) in his mother; Heart disease (age of onset: 29) in his father.  ROS:   Please see the history of present illness.    All other systems reviewed and are negative.  EKGs/Labs/Other Studies Reviewed:    The following studies were reviewed today: 2D echocardiogram 12/19/2017: Study Conclusions  - Left ventricle: The cavity size was moderately dilated. Systolic   function was severely reduced. The estimated ejection fraction   was in the range of 20% to 25%. Diffuse hypokinesis with akinesis   of the mid to apical inferosepal myocardium. Doppler parameters   are consistent  with high ventricular filling pressure. - Aortic valve: Transvalvular velocity was within the normal range.   There was no stenosis. There was mild perivalvular regurgitation.   Peak velocity (S): 201 cm/s. Mean gradient (S): 8 mm Hg. Valve   area (VTI): 1.78 cm^2. Valve area (Vmax): 1.73 cm^2. Valve area   (Vmean): 1.67 cm^2. Regurgitation pressure half-time: 691 ms. - Mitral valve: Transvalvular velocity was within the normal range.   There was no evidence for stenosis. There was mild regurgitation. - Left atrium: The atrium was severely dilated. - Right ventricle: The cavity size was mildly dilated. Wall   thickness was normal. Systolic function was normal. - Right atrium: The atrium was moderately dilated. - Tricuspid valve: There was mild regurgitation. - Pulmonary arteries: Systolic pressure was within the normal   range. PA peak pressure: 29 mm Hg (S).  Left ventricle:  The cavity size was moderately dilated. Systolic function was severely reduced. The estimated ejection fraction was in the range of 20% to 25%. Diffuse hypokinesis with akinesis of the mid to apical inferosepal myocardium. The study was not technically sufficient to allow evaluation of LV diastolic dysfunction due to atrial fibrillation. Doppler parameters are consistent with high ventricular filling pressure.  ------------------------------------------------------------------- Aortic valve:   Trileaflet; normal thickness leaflets. Mobility was not restricted.  Doppler:  Transvalvular velocity was within the normal range. There was no stenosis. There was mild perivalvular regurgitation.    VTI ratio of LVOT to aortic valve: 0.31. Valve area (VTI): 1.78 cm^2. Indexed valve area (VTI): 0.88 cm^2/m^2. Peak velocity ratio of LVOT to aortic valve: 0.3. Valve area (Vmax): 1.73 cm^2. Indexed valve area (Vmax): 0.85 cm^2/m^2. Mean velocity ratio of LVOT to aortic valve: 0.29. Valve area (Vmean): 1.67 cm^2. Indexed  valve area (Vmean): 0.82 cm^2/m^2.    Mean gradient (S): 8 mm Hg. Peak gradient (S):  16 mm Hg.  ------------------------------------------------------------------- Aorta:  Aortic root: The aortic root was normal in size. Ascending aorta: The ascending aorta was mildly dilated.  ------------------------------------------------------------------- Mitral valve:   Structurally normal valve.   Mobility was not restricted.  Doppler:  Transvalvular velocity was within the normal range. There was no evidence for stenosis. There was mild regurgitation.  ------------------------------------------------------------------- Left atrium:  The atrium was severely dilated.  ------------------------------------------------------------------- Right ventricle:  The cavity size was mildly dilated. Wall thickness was normal. Pacer wire or catheter noted in right ventricle. Systolic function was normal.  ------------------------------------------------------------------- Pulmonic valve:    Structurally normal valve.   Cusp separation was normal.  Doppler:  Transvalvular velocity was within the normal range. There was no evidence for stenosis. There was mild regurgitation.  ------------------------------------------------------------------- Tricuspid valve:   Structurally normal valve.    Doppler: Transvalvular velocity was within the normal range. There was mild regurgitation.  ------------------------------------------------------------------- Pulmonary artery:   The main pulmonary artery was normal-sized. Systolic pressure was within the normal range.  ------------------------------------------------------------------- Right atrium:  The atrium was moderately dilated.  ------------------------------------------------------------------- Pericardium:  There was no pericardial effusion.  ------------------------------------------------------------------- Systemic veins: Inferior vena  cava: The vessel was normal in size.  EKG:  EKG is not ordered today.   Recent Labs: 02/28/2018: ALT 11; BUN 17; Creatinine, Ser 0.85; Hemoglobin 12.4; Platelets 214; Potassium 4.4; Sodium 125  Recent Lipid Panel    Component Value Date/Time   CHOL 142 02/01/2016 1111   TRIG 72 02/01/2016 1111   HDL 51 02/01/2016 1111   CHOLHDL 2.8 02/01/2016 1111   VLDL 14 02/01/2016 1111   LDLCALC 77 02/01/2016 1111    Physical Exam:    VS:  BP 104/70   Pulse 65   Ht 6\' 2"  (1.88 m)   Wt 165 lb 12.8 oz (75.2 kg)   BMI 21.29 kg/m     Wt Readings from Last 3 Encounters:  05/08/18 165 lb 12.8 oz (75.2 kg)  02/28/18 168 lb 2 oz (76.3 kg)  12/19/17 173 lb (78.5 kg)     GEN: elderly male in NAD  HEENT: Normal NECK: No JVD; No carotid bruits LYMPHATICS: No lymphadenopathy CARDIAC: RRR, 2/6 SEM at the RUSB RESPIRATORY:  Clear to auscultation without rales, wheezing or rhonchi  ABDOMEN: Soft, non-tender, non-distended MUSCULOSKELETAL:  No edema; No deformity  SKIN: Warm and dry NEUROLOGIC:  Alert and oriented x 3 PSYCHIATRIC:  Normal affect   ASSESSMENT:    1. Chronic systolic heart failure ([place])   2. Permanent atrial fibrillation   3. Aortic valve disorder   4. Ventricular tachycardia ([place])    PLAN:    In order of problems listed above:  1. The patient is stable on a maximally tolerated medical program which is limited by low blood pressure and frailty.  No congestive symptoms or clinical exam evidence of volume overload. 2. The patient is appropriately anticoagulated with warfarin.  He has no history of stroke or TIA. 3. The patient's most recent echo is reviewed and demonstrates stable valve function with mild paravalvular regurgitation.  I cannot appreciate aortic insufficiency on his exam today.  Overall he appears to be stable. 4. The patient required antitachycardia pacing recently.  He is asymptomatic.  I reviewed this event with him at length today.  He understands that  with his cardiomyopathy he is at risk for primary arrhythmic death and that his ICD operated appropriately by pacing him out of ventricular tachycardia.   Medication Adjustments/Labs and Tests Ordered: Current  medicines are reviewed at length with the patient today.  Concerns regarding medicines are outlined above.  No orders of the defined types were placed in this encounter.  No orders of the defined types were placed in this encounter.   Patient Instructions  Medication Instructions:  Your provider recommends that you continue on your current medications as directed. Please refer to the Current Medication list given to you today.    Labwork: None  Testing/Procedures: None  Follow-Up: Your provider wants you to follow-up in: 4 months with Dr. Burt Knack. You will receive a reminder letter in the mail two months in advance. If you don't receive a letter, please call our office to schedule the follow-up appointment.      Signed, Sherren Mocha, MD  05/09/2018 10:49 PM    [place]

## 2018-05-09 ENCOUNTER — Ambulatory Visit (HOSPITAL_COMMUNITY)
Admission: RE | Admit: 2018-05-09 | Discharge: 2018-05-09 | Disposition: A | Discharge status: Home / Self Care | Payer: Medicare Other | Source: Ambulatory Visit | Attending: Otolaryngology | Admitting: Otolaryngology

## 2018-05-09 ENCOUNTER — Encounter: Payer: Self-pay | Admitting: Cardiovascular Disease

## 2018-05-09 DIAGNOSIS — R1310 Dysphagia, unspecified: Secondary | ICD-10-CM | POA: Diagnosis not present

## 2018-05-09 DIAGNOSIS — K219 Gastro-esophageal reflux disease without esophagitis: Secondary | ICD-10-CM | POA: Diagnosis not present

## 2018-05-09 DIAGNOSIS — R1313 Dysphagia, pharyngeal phase: Secondary | ICD-10-CM | POA: Insufficient documentation

## 2018-05-09 NOTE — Therapy (Signed)
Modified Barium Swallow Progress Note  Patient Details  Name: [name] MRN: [id number] Date of Birth: 1932/08/17  Today's Date: 05/09/2018  Modified Barium Swallow completed.  Full report located under Chart Review in the Imaging Section.  Brief recommendations include the following:  Clinical Impression  Pt exhibits mild oropharyngeal dysphagia with silent penetration of thin barium during consumption with pill. Oral phase marked by occasional lingual pumping, residue following regular texture and poor control of dual consistencies. Reduced epiglottic retroflexion resulted in moderate valleculae and pyriform sinuses with spontaneous swallow most of the time to decrease. Sensation was inconsistent therefore needed moderate verbal cues to swallow 2 additional times to safely decrease residue. Chin tuck assisted in significantly decreasing pyriform sinus residue. Silent aspiration with thin barium observed during trial with pill due to decreased control and cohesion and pill lodged in vallecula. Pill was transferred to pyriform sinuses with second swallow attempt then coughed up into his oral cavity requiring applesauce which successfully transited through pharyx. Evidence of previously diagnosed esophageal dysmotility during esophageal scan. Provided education with pt/family to continue regular texture, use caution with dual consistencies (eat solid and thin separately), swallow 2-3 additional times after bites, small bites/sips, pills whole in puree and reflux strategies. Advised to use extra caution when medical status/health declined.      Swallow Evaluation Recommendations       SLP Diet Recommendations: Regular solids;Thin liquid   Liquid Administration via: Cup;Straw   Medication Administration: Whole meds with puree   Supervision: Patient able to self feed;Intermittent supervision to cue for compensatory strategies   Compensations: Slow rate;Small sips/bites;Multiple dry swallows  after each bite/sip;Follow solids with liquid   Postural Changes: Remain semi-upright after after feeds/meals (Comment);Seated upright at 90 degrees   Oral Care Recommendations: Oral care BID        Houston Siren 05/09/2018,1:45 PM   Orbie Pyo [email or web address] Risk analyst [phone] Office [phone]

## 2018-05-10 ENCOUNTER — Ambulatory Visit (INDEPENDENT_AMBULATORY_CARE_PROVIDER_SITE_OTHER): Payer: Medicare Other | Admitting: Pharmacist

## 2018-05-10 DIAGNOSIS — Z5181 Encounter for therapeutic drug level monitoring: Secondary | ICD-10-CM

## 2018-05-10 DIAGNOSIS — I482 Chronic atrial fibrillation, unspecified: Secondary | ICD-10-CM | POA: Diagnosis not present

## 2018-05-10 LAB — POCT INR: INR: 4.5 — AB (ref 2.0–3.0)

## 2018-05-17 DIAGNOSIS — R82998 Other abnormal findings in urine: Secondary | ICD-10-CM | POA: Diagnosis not present

## 2018-05-17 DIAGNOSIS — R7309 Other abnormal glucose: Secondary | ICD-10-CM | POA: Diagnosis not present

## 2018-05-17 DIAGNOSIS — M810 Age-related osteoporosis without current pathological fracture: Secondary | ICD-10-CM | POA: Diagnosis not present

## 2018-05-17 DIAGNOSIS — E7849 Other hyperlipidemia: Secondary | ICD-10-CM | POA: Diagnosis not present

## 2018-05-17 DIAGNOSIS — E038 Other specified hypothyroidism: Secondary | ICD-10-CM | POA: Diagnosis not present

## 2018-05-17 DIAGNOSIS — Z125 Encounter for screening for malignant neoplasm of prostate: Secondary | ICD-10-CM | POA: Diagnosis not present

## 2018-05-20 DIAGNOSIS — Z952 Presence of prosthetic heart valve: Secondary | ICD-10-CM | POA: Diagnosis not present

## 2018-05-20 DIAGNOSIS — I5022 Chronic systolic (congestive) heart failure: Secondary | ICD-10-CM | POA: Diagnosis not present

## 2018-05-20 DIAGNOSIS — I4821 Permanent atrial fibrillation: Secondary | ICD-10-CM | POA: Diagnosis not present

## 2018-05-20 DIAGNOSIS — I700 Atherosclerosis of aorta: Secondary | ICD-10-CM | POA: Diagnosis not present

## 2018-05-20 DIAGNOSIS — I2721 Secondary pulmonary arterial hypertension: Secondary | ICD-10-CM | POA: Diagnosis not present

## 2018-05-20 DIAGNOSIS — E46 Unspecified protein-calorie malnutrition: Secondary | ICD-10-CM | POA: Diagnosis not present

## 2018-05-20 DIAGNOSIS — I2510 Atherosclerotic heart disease of native coronary artery without angina pectoris: Secondary | ICD-10-CM | POA: Diagnosis not present

## 2018-05-20 DIAGNOSIS — I472 Ventricular tachycardia: Secondary | ICD-10-CM | POA: Diagnosis not present

## 2018-05-20 DIAGNOSIS — Z0000 Encounter for general adult medical examination without abnormal findings: Secondary | ICD-10-CM | POA: Diagnosis not present

## 2018-05-20 DIAGNOSIS — Z6824 Body mass index (BMI) 24.0-24.9, adult: Secondary | ICD-10-CM | POA: Diagnosis not present

## 2018-05-20 DIAGNOSIS — Z1331 Encounter for screening for depression: Secondary | ICD-10-CM | POA: Diagnosis not present

## 2018-05-20 DIAGNOSIS — J449 Chronic obstructive pulmonary disease, unspecified: Secondary | ICD-10-CM | POA: Diagnosis not present

## 2018-05-21 ENCOUNTER — Ambulatory Visit (INDEPENDENT_AMBULATORY_CARE_PROVIDER_SITE_OTHER): Payer: Medicare Other | Admitting: *Deleted

## 2018-05-21 DIAGNOSIS — I472 Ventricular tachycardia, unspecified: Secondary | ICD-10-CM

## 2018-05-23 LAB — CUP PACEART REMOTE DEVICE CHECK
Battery Remaining Longevity: 82 mo
Brady Statistic RV Percent Paced: 3.21 %
HighPow Impedance: 43 Ohm
HighPow Impedance: 54 Ohm
Implantable Lead Implant Date: 20021220
Implantable Lead Location: 753860
Implantable Lead Model: 6944
Implantable Pulse Generator Implant Date: 20141201
Lead Channel Impedance Value: 456 Ohm
Lead Channel Pacing Threshold Amplitude: 0.875 V
Lead Channel Pacing Threshold Pulse Width: 0.4 ms
Lead Channel Sensing Intrinsic Amplitude: 16.875 mV
Lead Channel Sensing Intrinsic Amplitude: 16.875 mV
Lead Channel Setting Pacing Pulse Width: 0.4 ms
Lead Channel Setting Sensing Sensitivity: 0.3 mV
MDC IDC MSMT BATTERY VOLTAGE: 3 V
MDC IDC MSMT LEADCHNL RV IMPEDANCE VALUE: 551 Ohm
MDC IDC SESS DTM: 20200310114226
MDC IDC SET LEADCHNL RV PACING AMPLITUDE: 2 V

## 2018-05-24 ENCOUNTER — Ambulatory Visit (INDEPENDENT_AMBULATORY_CARE_PROVIDER_SITE_OTHER): Payer: Medicare Other | Admitting: Cardiovascular Disease

## 2018-05-24 DIAGNOSIS — I482 Chronic atrial fibrillation, unspecified: Secondary | ICD-10-CM | POA: Diagnosis not present

## 2018-05-24 DIAGNOSIS — Z5181 Encounter for therapeutic drug level monitoring: Secondary | ICD-10-CM | POA: Diagnosis not present

## 2018-05-24 DIAGNOSIS — Z7901 Long term (current) use of anticoagulants: Secondary | ICD-10-CM | POA: Diagnosis not present

## 2018-05-24 LAB — POCT INR: INR: 2.6 (ref 2.0–3.0)

## 2018-05-24 NOTE — Patient Instructions (Signed)
Description   Spoke to patient and advised pt to continue taking 1 tablet daily. Recheck INR in 2 weeks. Coumadin Clinic [phone]

## 2018-05-28 ENCOUNTER — Encounter: Payer: Self-pay | Admitting: Cardiology

## 2018-05-28 NOTE — Progress Notes (Signed)
Remote ICD transmission.   

## 2018-06-03 ENCOUNTER — Other Ambulatory Visit: Payer: Self-pay

## 2018-06-03 ENCOUNTER — Ambulatory Visit (INDEPENDENT_AMBULATORY_CARE_PROVIDER_SITE_OTHER): Payer: Medicare Other

## 2018-06-03 DIAGNOSIS — Z95810 Presence of automatic (implantable) cardiac defibrillator: Secondary | ICD-10-CM | POA: Diagnosis not present

## 2018-06-03 DIAGNOSIS — I5022 Chronic systolic (congestive) heart failure: Secondary | ICD-10-CM

## 2018-06-04 NOTE — Progress Notes (Signed)
EPIC Encounter for ICM Monitoring  Patient Name: [name] is a 85 y.o. male Date: 06/04/2018 Primary Care Physican: [name], MD Primary Cardiologist:[place] Electrophysiologist: Caryl Comes 06/04/2018 Weight:163.6 lbs   Heart Failure questions reviewed, pt asymptomatic.   Thoracic impedance normal.   Prescribed:Furosemide 20 mg 1 tablet as needed.  Has not needed any Furosemide in last month.   Labs: 02/28/2018 Creatinine 0.85, BUN 17, Potassium 4.4, Sodium 125, GFR >60 04/03/2017 Creatinine0.86, BUN9, Potassium 4.0, Sodium127, GFR>60  Recommendations: No changes. Encouraged to call for fluid symptoms.  Follow-up plan: ICM clinic phone appointment on4/27/2020.     Copy of ICM check sent to Dr. Caryl Comes.   3 month ICM trend: 06/03/2018    1 Year ICM trend:      Rosalene Billings, RN 06/04/2018 11:51 AM

## 2018-06-07 ENCOUNTER — Ambulatory Visit (INDEPENDENT_AMBULATORY_CARE_PROVIDER_SITE_OTHER): Payer: Medicare Other | Admitting: Cardiology

## 2018-06-07 DIAGNOSIS — I482 Chronic atrial fibrillation, unspecified: Secondary | ICD-10-CM

## 2018-06-07 DIAGNOSIS — Z5181 Encounter for therapeutic drug level monitoring: Secondary | ICD-10-CM

## 2018-06-07 LAB — POCT INR: INR: 2.4 (ref 2.0–3.0)

## 2018-06-21 ENCOUNTER — Ambulatory Visit (INDEPENDENT_AMBULATORY_CARE_PROVIDER_SITE_OTHER): Payer: Medicare Other | Admitting: Internal Medicine

## 2018-06-21 DIAGNOSIS — Z5181 Encounter for therapeutic drug level monitoring: Secondary | ICD-10-CM | POA: Diagnosis not present

## 2018-06-21 DIAGNOSIS — I482 Chronic atrial fibrillation, unspecified: Secondary | ICD-10-CM

## 2018-06-21 LAB — POCT INR: INR: 3.8 — AB (ref 2.0–3.0)

## 2018-07-01 ENCOUNTER — Other Ambulatory Visit: Payer: Self-pay | Admitting: Cardiovascular Disease

## 2018-07-05 ENCOUNTER — Ambulatory Visit (INDEPENDENT_AMBULATORY_CARE_PROVIDER_SITE_OTHER): Payer: Medicare Other | Admitting: Cardiovascular Disease

## 2018-07-05 DIAGNOSIS — I482 Chronic atrial fibrillation, unspecified: Secondary | ICD-10-CM | POA: Diagnosis not present

## 2018-07-05 DIAGNOSIS — Z5181 Encounter for therapeutic drug level monitoring: Secondary | ICD-10-CM

## 2018-07-05 DIAGNOSIS — Z7901 Long term (current) use of anticoagulants: Secondary | ICD-10-CM | POA: Diagnosis not present

## 2018-07-05 DIAGNOSIS — R339 Retention of urine, unspecified: Secondary | ICD-10-CM | POA: Diagnosis not present

## 2018-07-05 DIAGNOSIS — Z8546 Personal history of malignant neoplasm of prostate: Secondary | ICD-10-CM | POA: Diagnosis not present

## 2018-07-05 LAB — POCT INR: INR: 2.4 (ref 2.0–3.0)

## 2018-07-06 ENCOUNTER — Encounter (HOSPITAL_COMMUNITY): Payer: Self-pay | Admitting: Emergency Medicine

## 2018-07-06 ENCOUNTER — Other Ambulatory Visit: Payer: Self-pay

## 2018-07-06 ENCOUNTER — Observation Stay (HOSPITAL_COMMUNITY)
Admission: EM | Admit: 2018-07-06 | Discharge: 2018-07-07 | Disposition: A | Discharge status: Home / Self Care | Payer: Medicare Other | Attending: Cardiology | Admitting: Cardiology

## 2018-07-06 ENCOUNTER — Emergency Department (HOSPITAL_COMMUNITY): Payer: Medicare Other

## 2018-07-06 DIAGNOSIS — I472 Ventricular tachycardia, unspecified: Secondary | ICD-10-CM

## 2018-07-06 DIAGNOSIS — Z951 Presence of aortocoronary bypass graft: Secondary | ICD-10-CM | POA: Insufficient documentation

## 2018-07-06 DIAGNOSIS — I2510 Atherosclerotic heart disease of native coronary artery without angina pectoris: Secondary | ICD-10-CM | POA: Diagnosis not present

## 2018-07-06 DIAGNOSIS — Z8546 Personal history of malignant neoplasm of prostate: Secondary | ICD-10-CM | POA: Insufficient documentation

## 2018-07-06 DIAGNOSIS — Z952 Presence of prosthetic heart valve: Secondary | ICD-10-CM | POA: Diagnosis not present

## 2018-07-06 DIAGNOSIS — I5022 Chronic systolic (congestive) heart failure: Secondary | ICD-10-CM | POA: Diagnosis not present

## 2018-07-06 DIAGNOSIS — Z8673 Personal history of transient ischemic attack (TIA), and cerebral infarction without residual deficits: Secondary | ICD-10-CM | POA: Diagnosis not present

## 2018-07-06 DIAGNOSIS — I252 Old myocardial infarction: Secondary | ICD-10-CM | POA: Diagnosis not present

## 2018-07-06 DIAGNOSIS — Z87891 Personal history of nicotine dependence: Secondary | ICD-10-CM | POA: Insufficient documentation

## 2018-07-06 DIAGNOSIS — R55 Syncope and collapse: Secondary | ICD-10-CM | POA: Diagnosis present

## 2018-07-06 DIAGNOSIS — Z9049 Acquired absence of other specified parts of digestive tract: Secondary | ICD-10-CM | POA: Insufficient documentation

## 2018-07-06 DIAGNOSIS — M545 Low back pain: Secondary | ICD-10-CM | POA: Diagnosis present

## 2018-07-06 DIAGNOSIS — Z79899 Other long term (current) drug therapy: Secondary | ICD-10-CM | POA: Insufficient documentation

## 2018-07-06 DIAGNOSIS — Z95810 Presence of automatic (implantable) cardiac defibrillator: Secondary | ICD-10-CM | POA: Diagnosis not present

## 2018-07-06 DIAGNOSIS — I4729 Other ventricular tachycardia: Secondary | ICD-10-CM

## 2018-07-06 DIAGNOSIS — I4891 Unspecified atrial fibrillation: Secondary | ICD-10-CM | POA: Diagnosis not present

## 2018-07-06 DIAGNOSIS — M5489 Other dorsalgia: Secondary | ICD-10-CM | POA: Diagnosis not present

## 2018-07-06 DIAGNOSIS — I491 Atrial premature depolarization: Secondary | ICD-10-CM | POA: Diagnosis not present

## 2018-07-06 DIAGNOSIS — Z7982 Long term (current) use of aspirin: Secondary | ICD-10-CM | POA: Insufficient documentation

## 2018-07-06 DIAGNOSIS — R52 Pain, unspecified: Secondary | ICD-10-CM | POA: Diagnosis not present

## 2018-07-06 LAB — URINALYSIS, ROUTINE W REFLEX MICROSCOPIC
Bilirubin Urine: NEGATIVE
Glucose, UA: NEGATIVE mg/dL
Hgb urine dipstick: NEGATIVE
Ketones, ur: 20 mg/dL — AB
Leukocytes,Ua: NEGATIVE
Nitrite: NEGATIVE
Protein, ur: NEGATIVE mg/dL
Specific Gravity, Urine: 1.012 (ref 1.005–1.030)
pH: 7 (ref 5.0–8.0)

## 2018-07-06 LAB — BASIC METABOLIC PANEL
Anion gap: 10 (ref 5–15)
BUN: 11 mg/dL (ref 8–23)
CO2: 24 mmol/L (ref 22–32)
Calcium: 8.7 mg/dL — ABNORMAL LOW (ref 8.9–10.3)
Chloride: 90 mmol/L — ABNORMAL LOW (ref 98–111)
Creatinine, Ser: 0.86 mg/dL (ref 0.61–1.24)
GFR calc Af Amer: >60 mL/min (ref >60)
GFR calc non Af Amer: >60 mL/min (ref >60)
Glucose, Bld: 94 mg/dL (ref 70–99)
Potassium: 4.4 mmol/L (ref 3.5–5.1)
Sodium: 124 mmol/L — ABNORMAL LOW (ref 135–145)

## 2018-07-06 LAB — TSH: TSH: 4.131 u[IU]/mL (ref 0.350–4.500)

## 2018-07-06 LAB — CBC WITH DIFFERENTIAL/PLATELET
Abs Immature Granulocytes: 0.03 10*3/uL (ref 0.00–0.07)
Basophils Absolute: 0 10*3/uL (ref 0.0–0.1)
Basophils Relative: 1 %
Eosinophils Absolute: 0.3 10*3/uL (ref 0.0–0.5)
Eosinophils Relative: 3 %
HCT: 36.8 % — ABNORMAL LOW (ref 39.0–52.0)
Hemoglobin: 12.1 g/dL — ABNORMAL LOW (ref 13.0–17.0)
Immature Granulocytes: 0 %
Lymphocytes Relative: 7 %
Lymphs Abs: 0.6 10*3/uL — ABNORMAL LOW (ref 0.7–4.0)
MCH: 30.7 pg (ref 26.0–34.0)
MCHC: 32.9 g/dL (ref 30.0–36.0)
MCV: 93.4 fL (ref 80.0–100.0)
Monocytes Absolute: 0.7 10*3/uL (ref 0.1–1.0)
Monocytes Relative: 9 %
Neutro Abs: 6 10*3/uL (ref 1.7–7.7)
Neutrophils Relative %: 80 %
Platelets: 209 10*3/uL (ref 150–400)
RBC: 3.94 MIL/uL — ABNORMAL LOW (ref 4.22–5.81)
RDW: 13.5 % (ref 11.5–15.5)
WBC: 7.6 10*3/uL (ref 4.0–10.5)
nRBC: 0 % (ref 0.0–0.2)

## 2018-07-06 LAB — TROPONIN I
Troponin I: <0.03 ng/mL (ref <0.03)
Troponin I: <0.03 ng/mL (ref <0.03)

## 2018-07-06 LAB — PROTIME-INR
INR: 2.8 — ABNORMAL HIGH (ref 0.8–1.2)
Prothrombin Time: 29.3 seconds — ABNORMAL HIGH (ref 11.4–15.2)

## 2018-07-06 LAB — BRAIN NATRIURETIC PEPTIDE: B Natriuretic Peptide: 724.5 pg/mL — ABNORMAL HIGH (ref 0.0–100.0)

## 2018-07-06 LAB — MAGNESIUM: Magnesium: 2 mg/dL (ref 1.7–2.4)

## 2018-07-06 MED ORDER — LINACLOTIDE 145 MCG PO CAPS
145.0000 ug | ORAL_CAPSULE | Freq: Every day | ORAL | Status: DC
  Filled 2018-07-06: qty 1

## 2018-07-06 MED ORDER — MEXILETINE HCL 150 MG PO CAPS
150.0000 mg | ORAL_CAPSULE | Freq: Two times a day (BID) | ORAL | Status: DC
  Administered 2018-07-06 – 2018-07-07 (×2): 150 mg via ORAL
  Filled 2018-07-06 (×3): qty 1

## 2018-07-06 MED ORDER — ALFUZOSIN HCL ER 10 MG PO TB24
10.0000 mg | ORAL_TABLET | Freq: Every day | ORAL | Status: DC
  Administered 2018-07-06: 10 mg via ORAL
  Filled 2018-07-06: qty 1

## 2018-07-06 MED ORDER — WARFARIN SODIUM 5 MG PO TABS
5.0000 mg | ORAL_TABLET | Freq: Once | ORAL | Status: AC
  Administered 2018-07-06: 5 mg via ORAL
  Filled 2018-07-06: qty 1

## 2018-07-06 MED ORDER — BENZONATATE 100 MG PO CAPS: 100.0000 mg | ORAL_CAPSULE | Freq: Three times a day (TID) | ORAL | Status: DC | PRN

## 2018-07-06 MED ORDER — PANTOPRAZOLE SODIUM 40 MG PO TBEC
40.0000 mg | DELAYED_RELEASE_TABLET | Freq: Every day | ORAL | Status: DC
  Administered 2018-07-06 – 2018-07-07 (×2): 40 mg via ORAL
  Filled 2018-07-06 (×2): qty 1

## 2018-07-06 MED ORDER — IPRATROPIUM BROMIDE 0.06 % NA SOLN
1.0000 | Freq: Two times a day (BID) | NASAL | Status: DC
  Administered 2018-07-07: 1 via NASAL
  Filled 2018-07-06: qty 15

## 2018-07-06 MED ORDER — CARVEDILOL 6.25 MG PO TABS
6.2500 mg | ORAL_TABLET | Freq: Two times a day (BID) | ORAL | Status: DC
  Administered 2018-07-06 – 2018-07-07 (×2): 6.25 mg via ORAL
  Filled 2018-07-06 (×2): qty 1

## 2018-07-06 MED ORDER — CYCLOSPORINE 0.05 % OP EMUL
1.0000 [drp] | Freq: Two times a day (BID) | OPHTHALMIC | Status: DC
  Administered 2018-07-06 – 2018-07-07 (×2): 1 [drp] via OPHTHALMIC
  Filled 2018-07-06 (×2): qty 30

## 2018-07-06 MED ORDER — BUDESONIDE-FORMOTEROL FUMARATE 80-4.5 MCG/ACT IN AERO
1.0000 | INHALATION_SPRAY | Freq: Two times a day (BID) | RESPIRATORY_TRACT | Status: DC
  Filled 2018-07-06: qty 6.9

## 2018-07-06 MED ORDER — TRAMADOL HCL 50 MG PO TABS: 50.0000 mg | ORAL_TABLET | Freq: Four times a day (QID) | ORAL | Status: DC | PRN

## 2018-07-06 MED ORDER — ASPIRIN EC 81 MG PO TBEC
81.0000 mg | DELAYED_RELEASE_TABLET | Freq: Every day | ORAL | Status: DC
  Administered 2018-07-07: 81 mg via ORAL
  Filled 2018-07-06: qty 1

## 2018-07-06 MED ORDER — FUROSEMIDE 20 MG PO TABS: 20.0000 mg | ORAL_TABLET | ORAL | Status: DC | PRN

## 2018-07-06 MED ORDER — FAMOTIDINE 20 MG PO TABS
40.0000 mg | ORAL_TABLET | Freq: Every day | ORAL | Status: DC
  Administered 2018-07-06 – 2018-07-07 (×2): 40 mg via ORAL
  Filled 2018-07-06 (×2): qty 2

## 2018-07-06 MED ORDER — MOMETASONE FURO-FORMOTEROL FUM 100-5 MCG/ACT IN AERO
2.0000 | INHALATION_SPRAY | Freq: Two times a day (BID) | RESPIRATORY_TRACT | Status: DC
  Administered 2018-07-07: 08:00:00 2 via RESPIRATORY_TRACT
  Filled 2018-07-06: qty 8.8

## 2018-07-06 MED ORDER — ALBUTEROL SULFATE (2.5 MG/3ML) 0.083% IN NEBU: 2.5000 mg | INHALATION_SOLUTION | Freq: Four times a day (QID) | RESPIRATORY_TRACT | Status: DC | PRN

## 2018-07-06 MED ORDER — WARFARIN - PHARMACIST DOSING INPATIENT
Freq: Every day | Status: DC
  Administered 2018-07-06: 18:00:00

## 2018-07-06 MED ORDER — WARFARIN SODIUM 5 MG PO TABS: 5.0000 mg | ORAL_TABLET | ORAL | Status: DC

## 2018-07-06 MED ORDER — ONDANSETRON HCL 4 MG/2ML IJ SOLN: 4.0000 mg | Freq: Four times a day (QID) | INTRAMUSCULAR | Status: DC | PRN

## 2018-07-06 MED ORDER — NITROGLYCERIN 0.4 MG SL SUBL: 0.4000 mg | SUBLINGUAL_TABLET | SUBLINGUAL | Status: DC | PRN

## 2018-07-06 MED ORDER — LEVOTHYROXINE SODIUM 25 MCG PO TABS
125.0000 ug | ORAL_TABLET | Freq: Every day | ORAL | Status: DC
  Administered 2018-07-07: 125 ug via ORAL
  Filled 2018-07-06: qty 1

## 2018-07-06 MED ORDER — POLYETHYLENE GLYCOL 3350 17 G PO PACK: 17.0000 g | PACK | Freq: Every day | ORAL | Status: DC | PRN

## 2018-07-06 MED ORDER — IPRATROPIUM BROMIDE 0.03 % NA SOLN
2.0000 | Freq: Two times a day (BID) | NASAL | Status: DC
  Filled 2018-07-06: qty 30

## 2018-07-06 NOTE — ED Triage Notes (Signed)
PT given drink and a Happy meal.

## 2018-07-06 NOTE — ED Provider Notes (Addendum)
[facility] DE[facility]MENT Provider Note   CSN: [id number] Arrival date & time: 07/06/18  1058    History   Chief Complaint Chief Complaint  Patient presents with   Loss of Consciousness    HPI ISAY PERLEBERG is a 85 y.o. male.     HPI   Patient presents for evaluation of a brief episode of syncope preceded by low back pain.  He was at home with his wife when this happened.  He has ongoing chronic back pain secondary to fractures of the vertebrae, remotely.  Today he was having more pain than usual, was sitting, when he passed out.  He was unconscious for about 45 seconds.  He presents for evaluation by EMS.  States he fell yesterday without injury because his legs were weak.  He is bothered by his knees which have "bone-on-bone."  He denies recent fever, chills, cough, shortness of breath, chest pain, focal weakness or paresthesia.  He is taking his usual prescribed medications.  He does not feel that he had palpitations today or that his defibrillator went off.  There are no other known modifying factors.  Past Medical History:  Diagnosis Date   AF (atrial fibrillation) (HCC)    Has not tolerated amiodarone in the past. Amiodarone was stopped in September of 2010 due to side effects   Aortic stenosis, severe    WITH PERCUTANEOUS AORTIC VALVE (TAVI) IN March 2012 at the [facility]   Cervical myelopathy [facility])    CHF (congestive heart failure) (HCC)    Chronic anticoagulation    on coumadin   Coronary artery disease    Diverticulitis    CURRENTLY CONTROLLED WITH NO EVIDENCE OF RECURRENT INFECTION   Esophageal stricture    WITH DILATATION   GERD (gastroesophageal reflux disease)    Heart murmur    Hyperlipidemia    Ischemic cardiomyopathy 05/2010   Has EF of 25%   MI (myocardial infarction) ([place]) 1974, 1978   NSVT (nonsustained ventricular tachycardia) ([place])    Osteoarthritis    RIGHT KNEE   Osteoporosis    Prostate cancer  ([place])    Pulmonary embolism ([place])    Stroke ([place])    3 strokes    Patient Active Problem List   Diagnosis Date Noted   Pressure injury of skin 01/21/2017   Acute urinary retention 01/21/2017   Shortness of breath    Weakness 09/01/2015   Hyponatremia 09/01/2015   Hypothyroidism 09/01/2015   DOE (dyspnea on exertion) 40/10/6759   Chronic systolic CHF (congestive heart failure) ([place]) 09/01/2015   Exertional dyspnea 09/01/2015   GI bleed 01/16/2014   GIB (gastrointestinal bleeding) 01/16/2014   Encounter for therapeutic drug monitoring 05/09/2013   Automatic implantable cardioverter-defibrillator in situ 12/13/2012   Rectal bleed 05/30/2012    Class: Acute   Acute low back pain 05/30/2012    Class: Acute   Hypotension 05/30/2012    Class: Acute   Vertebral fracture, osteoporotic ([place]) 05/21/2012   Gait instability 12/02/2011    Class: Acute   Atrial fibrillation, chronic 12/02/2011    Class: Chronic   Anticoagulated on Coumadin 12/02/2011   Deformity, finger acquired 12/19/2010   Chronic cough 12/05/2010   S/P aortic valve replacement 10/22/2010   Ischemic cardiomyopathy    Diverticulitis    Prostate cancer ([place])    Osteoarthritis    Cervical myelopathy ([place])    Pulmonary embolism (HCC)    Hyperlipidemia    GERD (gastroesophageal reflux disease)    Esophageal  stricture    Indigestion 06/29/2010   VENTRICULAR TACHYCARDIA 01/15/2009    Past Surgical History:  Procedure Laterality Date   AORTIC VALVE REPLACEMENT     Percutaneous AVR in March 2012 at the [place]  2010   SEVERE LV DYSFUNCTION WITH ESTIMATED EJECTION FRACTION OF 25%   CARDIOVERSION  02/16/2011   Procedure: CARDIOVERSION;  Surgeon: Carlena Bjornstad, MD;  Location: [facility];  Service: Cardiovascular;  Laterality: N/A;   [place]   ICD  Feb 2003; 02/2013   gen change 02-11-2013 by Dr Caryl Comes   IMPLANTABLE CARDIOVERTER DEFIBRILLATOR (ICD) GENERATOR CHANGE N/A 02/10/2013   Procedure: ICD GENERATOR CHANGE;  Surgeon: Deboraha Sprang, MD;  Location: [facility] CATH LAB;  Service: Cardiovascular;  Laterality: N/A;   SHOULDER SURGERY          Home Medications    Prior to Admission medications   Medication Sig Start Date End Date Taking? Authorizing Provider  albuterol (PROVENTIL HFA;VENTOLIN HFA) 108 (90 Base) MCG/ACT inhaler Inhale 1-2 puffs into the lungs every 6 (six) hours as needed for wheezing or shortness of breath.    [provider]  alfuzosin (UROXATRAL) 10 MG 24 hr tablet Take 10 mg by mouth at bedtime.     [provider]  aspirin 81 MG tablet Take 81 mg by mouth daily.    [provider]  atorvastatin (LIPITOR) 10 MG tablet Take 10 mg by mouth every other day.    [provider]  benzonatate (TESSALON) 100 MG capsule Take 100 mg by mouth 3 (three) times daily as needed for cough.    [provider]  Budesonide-Formoterol Fumarate (SYMBICORT IN) Inhale 1 puff into the lungs 2 (two) times daily.    [provider]  carvedilol (COREG) 6.25 MG tablet TAKE 1 TABLET BY MOUTH TWICE DAILY WITH A MEAL. 07/01/18   Sherren Mocha, MD  cholecalciferol (VITAMIN D) 1000 UNITS tablet Take 1,000 Units by mouth 2 (two) times daily.    [provider]  COUMADIN 5 MG tablet TAKE AS DIRECTED BY COUMADIN CLINIC. 07/01/18   Sherren Mocha, MD  dicyclomine (BENTYL) 20 MG tablet Take 1 tablet (20 mg total) by mouth 2 (two) times daily as needed for spasms. 07/06/16   Carlisle Cater, PA-C  furosemide (LASIX) 20 MG tablet TAKE (1) TABLET DAILY AS NEEDED FOR SWELLING. 06/19/16   Sherren Mocha, MD  ipratropium (ATROVENT) 0.03 % nasal spray Place 2 sprays into both nostrils 2 (two) times daily.    [provider]  lactulose (CEPHULAC) 10 g packet Take 1 packet (10  g total) daily as needed by mouth (for severe constipation). 01/22/17   Dessa Phi, DO  levothyroxine (SYNTHROID) 125 MCG tablet Take 1 tablet (125 mcg total) by mouth daily before breakfast. 09/02/15   Hosie Poisson, MD  linaclotide (LINZESS) 145 MCG CAPS capsule Take 145 mcg by mouth daily before breakfast.    [provider]  nitroGLYCERIN (NITROSTAT) 0.4 MG SL tablet Place 1 tablet (0.4 mg total) under the tongue every 5 (five) minutes x 3 doses as needed for chest pain (Max 3 doses in 15 min. Call 911). 12/06/16   Sherren Mocha, MD  omeprazole (PRILOSEC) 20 MG capsule Take 20 mg by mouth daily as needed (acid reflux).    [provider]  polyethylene glycol (MIRALAX / GLYCOLAX) packet Take 17 g by mouth daily as needed for mild constipation.     [provider]  Probiotic Product (PROBIOTIC DAILY PO) Take 1 capsule by mouth daily.    [provider]  RESTASIS 0.05 % ophthalmic emulsion Place 1 drop into both eyes 2 (two) times daily as needed (dry eyes).  12/05/11   [provider]  traMADol (ULTRAM) 50 MG tablet Take 50 mg by mouth every 6 (six) hours as needed for moderate pain.    [provider]    Family History Family History  Problem Relation Age of Onset   Heart disease Mother 28   Diabetes Mother 53   Heart disease Father 34    Social History Social History   Tobacco Use   Smoking status: Former Smoker    Packs/day: 4.00    Years: 15.00    Pack years: 60.00    Types: Cigarettes    Last attempt to quit: 06/15/1969    Years since quitting: 49.0   Smokeless tobacco: Never Used  Substance Use Topics   Alcohol use: No   Drug use: No     Allergies   Penicillins; Tizanidine; Ace inhibitors; Antihistamines, diphenhydramine-type; Clemastine fumarate; Dimenhydrinate; and Amiodarone   Review of Systems Review of Systems  All other systems reviewed and are negative.    Physical Exam Updated Vital Signs BP  122/82    Pulse 73    Temp 97.9 F (36.6 C) (Oral)    Resp 15    SpO2 100%   Physical Exam Vitals signs and nursing note reviewed.  Constitutional:      General: He is not in acute distress.    Appearance: Normal appearance. He is well-developed and normal weight. He is not ill-appearing, toxic-appearing or diaphoretic.  HENT:     Head: Normocephalic and atraumatic.     Right Ear: External ear normal.     Left Ear: External ear normal.  Eyes:     Conjunctiva/sclera: Conjunctivae normal.     Pupils: Pupils are equal, round, and reactive to light.  Neck:     Musculoskeletal: Normal range of motion and neck supple.     Trachea: Phonation normal.  Cardiovascular:     Rate and Rhythm: Normal rate and regular rhythm.     Heart sounds: Normal heart sounds.  Pulmonary:     Effort: Pulmonary effort is normal.     Breath sounds: Normal breath sounds.  Abdominal:     General: There is no distension.     Palpations: Abdomen is soft.     Tenderness: There is no abdominal tenderness.  Musculoskeletal: Normal range of motion.        General: No swelling or tenderness.  Skin:    General: Skin is warm and dry.  Neurological:     Mental Status: He is alert and oriented to person, place, and time.     Cranial Nerves: No cranial nerve deficit.     Sensory: No sensory deficit.     Motor: No abnormal muscle tone.     Coordination: Coordination normal.  Psychiatric:        Mood and Affect: Mood normal.        Behavior: Behavior normal.        Thought Content: Thought content normal.        Judgment: Judgment normal.      ED Treatments / Results  Labs (all labs ordered are listed, but only abnormal results  are displayed) Labs Reviewed - No data to display  EKG EKG Interpretation  Date/Time:  Saturday July 06 2018 11:04:12 EDT Ventricular Rate:  87 PR Interval:    QRS Duration: 122 QT Interval:  418 QTC Calculation: 503 R Axis:   91 Text Interpretation:  Atrial fibrillation  Ventricular premature complex Consider left ventricular hypertrophy Abnormal T, consider ischemia, diffuse leads Prolonged QT interval Baseline wander in lead(s) I III aVL Since last tracing QT has lengthened Confirmed by Daleen Bo [phone]) on 07/06/2018 11:07:24 AM   Radiology No results found.  Procedures .Critical Care Performed by: Daleen Bo, MD Authorized by: Daleen Bo, MD   Critical care provider statement:    Critical care time (minutes):  35   Critical care start time:  07/06/2018 11:15 AM   Critical care end time:  07/06/2018 4:30 PM   Critical care time was exclusive of:  Separately billable procedures and treating other patients   Critical care was necessary to treat or prevent imminent or life-threatening deterioration of the following conditions:  Cardiac failure   Critical care was time spent personally by me on the following activities:  Blood draw for specimens, development of treatment plan with patient or surrogate, discussions with consultants, evaluation of patient's response to treatment, examination of patient, obtaining history from patient or surrogate, ordering and performing treatments and interventions, ordering and review of laboratory studies, pulse oximetry, re-evaluation of patient's condition, review of old charts and ordering and review of radiographic studies   (including critical care time)  Medications Ordered in ED Medications - No data to display   Initial Impression / Assessment and Plan / ED Course  I have reviewed the triage vital signs and the nursing notes.  Pertinent labs & imaging results that were available during my care of the patient were reviewed by me and considered in my medical decision making (see chart for details).  Clinical Course as of Jul 07 949  Sat Jul 06, 2018  1403 Normal except mild ketone elevation  Urinalysis, Routine w reflex microscopic(!) [EW]  1403 Therapeutic  Protime-INR(!) [EW]  1404 Normal     Troponin I - Once [EW]  1404 Normal except sodium low, chloride low, calcium low  Basic metabolic panel(!) [EW]  9163 Normal except hemoglobin low  CBC with Differential(!) [EW]  1620 After pacemaker interrogation I discussed the case with the technician who reviewed the data, she was found talking.  This morning around 10:09 AM the patient had a episode of heart rate around 200, with pacer trying to attempt rate control, feeling, but elevated heart rate resolved spontaneously after a brief period of bradycardia.  He did not receive a shock at that time.  He has had some other periods of rapid heartbeat, also nonsustained, associated with subsequent short periods of bradycardia.  No other cardiac shocks, recently.   [EW]    Clinical Course User Index [EW] Daleen Bo, MD        Patient Vitals for the past 24 hrs:  BP Temp Temp src Pulse Resp SpO2  07/06/18 1106 122/82 97.9 F (36.6 C) Oral 73 15 100 %    4:20 PM  Reevaluation with update and discussion. After initial assessment and treatment, an updated evaluation reveals he is comfortable. Findings discussed with patient and his wife by phone; all questions answered.Daleen Bo   Medical Decision Making: Syncope with Ventricular tachycardia, non-sustained. Doubt ACS, PE, or metabolic instability. Patient requires admission and management by Cardiology/EP.  CRITICAL CARE-  Yes Performed by: Daleen Bo   Nursing Notes Reviewed/ Care Coordinated Applicable Imaging Reviewed Interpretation of Laboratory Data incorporated into ED treatment  Consult with Cardiology- They will admit. Discussed case with Dr. Curt Bears (EP) in ED  Plan: Admit   Final Clinical Impressions(s) / ED Diagnoses   Final diagnoses:  Paroxysmal ventricular tachycardia ([address])  Syncope, unspecified syncope type    ED Discharge Orders    None           Daleen Bo, MD 07/08/18 [phone]

## 2018-07-06 NOTE — ED Triage Notes (Addendum)
Patient arrives via [facility] from home, patients wife called out stating that patient was c/o back pain while sitting and then had an episode that lasted about 1 minute where patient was unresponsive, no jerking movements noted, no hx of seizures. Patient also c/o generalized weakness and more difficulty ambulating recently VSS: bp 142/74, HR 60-90afib w/hx of the same, RR 18 104 cbg 98% on room air. Patient a/ox4, speech clear, moves all limbs equally. Pacemaker/defibrillator in place. Pt also reports cough occasionally for the past few weeks and some nausea this am after syncopal episode but denies any recent sick contacts, no fevers, chills body aches.

## 2018-07-06 NOTE — ED Notes (Signed)
Pt was assist of two and unsteady with balance.

## 2018-07-06 NOTE — H&P (Signed)
Cardiology Admission History and Physical:   Patient ID: [name] MRN: [id number]; DOB: 1932-05-19   Admission date: 07/06/2018  Primary Care Provider: [name], MD Primary Cardiologist: Sherren Mocha, MD  Primary Electrophysiologist:  Virl Axe, MD   Chief Complaint:  VT and syncope   Patient Profile:   [name] is a 85 y.o. male with PMH of CABG in '79 and redo CABG '91, ICM (EF 25%), TAVR '12, permanent Afib, HL, Stroke, PE who presented with back pain and syncope.    History of Present Illness:   [name] is a an 85 yo male with PMH noted above.  Jeffrey is followed by Dr. Burt Knack, and Dr. Caryl Comes as an outpatient.  Jeffrey initially underwent CABG in 1997 with redo CABG in 1991.  Has a longstanding history of severe ischemic cardiomyopathy with an EF of 25%.  Jeffrey underwent TAVR at [facility] in 2012 and has been followed for mild to moderate paravalvular aortic insufficiency since that time.  Jeffrey also has a history of permanent atrial fibrillation and is maintained on long-term anticoagulation with Coumadin Jeffrey has had trouble tolerating optimization of heart failure medications in the past secondary to hypotension and frailty.  Also has a known history of ventricular tachycardia with ICD which is followed by Dr. Caryl Comes.  Jeffrey was last seen in the office by Dr. Burt Knack on 05/08/2018 and was reported to have issues with aspiration and frequent coughing whenever Jeffrey eats.  At that time was scheduled to have a barium swallow.  Overall generally remained weak and unsteady but was able to ambulate with a walker at home.  From a cardiac viewpoint Jeffrey denied any symptoms of chest pain or shortness of breath, no lower extremity edema.  Jeffrey had no lightheadedness or palpitations.  Did note an episode of tachycardia with pacing for treatment of VT.  Jeffrey had been paced out of VT without receiving a shock.  Jeffrey was notified by the device clinic that Jeffrey should not drive for 6 months.  It is noted that Jeffrey  has not driven for quite some time regardless.  Most recent echocardiogram on 10/19 showed EF of 20 to 25% with diffuse hypokinesis with akinesis in the mid to apical inferior septal myocardium with severely dilated left atrium and moderately dilated right atrium.   Jeffrey presented to the ED on 07/06/2018 via EMS.  Per patient's wife Jeffrey was complaining of back pain while sitting at home which is not unusual for him but then had a witnessed episode of unresponsiveness that lasted for about a minute.  No reported jerking movements or seizure-like activity.  Wife reports that Jeffrey did fall the day prior but did not sustain an injury.  On EMS arrival complained of generalized weakness, but blood pressure and heart rate were noted to be stable.  CBG was 104.  Jeffrey was alert and oriented with clear speech.   In the ED his labs showed Na + 124 (which appears to be chronically low), other electrolytes stable, Trop neg, Hgb 12.1, INR 2.8. EKG showed rate controlled Afib with PVCs. CXR without edema. Device was interrogated in the ED and reported to have had episode of VT during this near syncopal event. Transmission from rep is pending at the time of assessment.    Past Medical History:  Diagnosis Date   AF (atrial fibrillation) (HCC)    Has not tolerated amiodarone in the past. Amiodarone was stopped in September of 2010 due to side effects  Aortic stenosis, severe    WITH PERCUTANEOUS AORTIC VALVE (TAVI) IN March 2012 at [facility]   Cervical myelopathy [facility])    CHF (congestive heart failure) (HCC)    Chronic anticoagulation    on coumadin   Coronary artery disease    Diverticulitis    CURRENTLY CONTROLLED WITH NO EVIDENCE OF RECURRENT INFECTION   Esophageal stricture    WITH DILATATION   GERD (gastroesophageal reflux disease)    Heart murmur    Hyperlipidemia    Ischemic cardiomyopathy 05/2010   Has EF of 25%   MI (myocardial infarction) ([place]) 1974, 1978   NSVT (nonsustained  ventricular tachycardia) ([place])    Osteoarthritis    RIGHT KNEE   Osteoporosis    Prostate cancer ([place])    Pulmonary embolism ([place])    Stroke ([place])    3 strokes    Past Surgical History:  Procedure Laterality Date   AORTIC VALVE REPLACEMENT     Percutaneous AVR in March 2012 at the [place]  2010   SEVERE LV DYSFUNCTION WITH ESTIMATED EJECTION FRACTION OF 25%   CARDIOVERSION  02/16/2011   Procedure: CARDIOVERSION;  Surgeon: Carlena Bjornstad, MD;  Location: [place];  Service: Cardiovascular;  Laterality: N/A;   [place]   ICD  Feb 2003; 02/2013   gen change 02-11-2013 by Dr Caryl Comes   IMPLANTABLE CARDIOVERTER DEFIBRILLATOR (ICD) GENERATOR CHANGE N/A 02/10/2013   Procedure: ICD GENERATOR CHANGE;  Surgeon: Deboraha Sprang, MD;  Location: [facility] CATH LAB;  Service: Cardiovascular;  Laterality: N/A;   SHOULDER SURGERY       Medications Prior to Admission: Prior to Admission medications   Medication Sig Start Date End Date Taking? Authorizing Provider  albuterol (PROVENTIL HFA;VENTOLIN HFA) 108 (90 Base) MCG/ACT inhaler Inhale 1-2 puffs into the lungs every 6 (six) hours as needed for wheezing or shortness of breath.   Yes [provider]  alfuzosin (UROXATRAL) 10 MG 24 hr tablet Take 10 mg by mouth at bedtime.    Yes [provider]  aspirin 81 MG tablet Take 81 mg by mouth daily.   Yes [provider]  benzonatate (TESSALON) 100 MG capsule Take 100 mg by mouth 3 (three) times daily as needed for cough.   Yes [provider]  Budesonide-Formoterol Fumarate (SYMBICORT IN) Inhale 1 puff into the lungs 2 (two) times daily.   Yes [provider]  carvedilol (COREG) 6.25 MG tablet TAKE 1 TABLET BY MOUTH TWICE DAILY WITH A MEAL. Patient taking differently: Take 6.25 mg by mouth 2 (two) times daily  with a meal.  07/01/18  Yes Sherren Mocha, MD  cholecalciferol (VITAMIN D) 1000 UNITS tablet Take 1,000 Units by mouth 2 (two) times daily.   Yes [provider]  COUMADIN 5 MG tablet TAKE AS DIRECTED BY COUMADIN CLINIC. Patient taking differently: Take 5-7.5 mg by mouth See admin instructions. Take 7.5 mg by mouth only on Fridays; all other days take 5mg  07/01/18  Yes Sherren Mocha, MD  Dextromethorphan Polistirex (DELSYM PO) Take 15 mLs by mouth as needed (cough).   Yes [provider]  famotidine (PEPCID) 40 MG tablet Take 40 mg by mouth daily.   Yes [provider]  furosemide (LASIX) 20 MG tablet TAKE (1) TABLET DAILY AS NEEDED FOR SWELLING. Patient taking differently:  Take 20 mg by mouth as needed for fluid.  06/19/16  Yes Sherren Mocha, MD  ipratropium (ATROVENT) 0.03 % nasal spray Place 2 sprays into both nostrils 2 (two) times daily.   Yes [provider]  levothyroxine (SYNTHROID) 125 MCG tablet Take 1 tablet (125 mcg total) by mouth daily before breakfast. 09/02/15  Yes Hosie Poisson, MD  linaclotide (LINZESS) 145 MCG CAPS capsule Take 145 mcg by mouth daily before breakfast.   Yes [provider]  nitroGLYCERIN (NITROSTAT) 0.4 MG SL tablet Place 1 tablet (0.4 mg total) under the tongue every 5 (five) minutes x 3 doses as needed for chest pain (Max 3 doses in 15 min. Call 911). 12/06/16  Yes Sherren Mocha, MD  omeprazole (PRILOSEC) 20 MG capsule Take 20 mg by mouth daily as needed (acid reflux).   Yes [provider]  polyethylene glycol (MIRALAX / GLYCOLAX) packet Take 17 g by mouth daily as needed for mild constipation.    Yes [provider]  Probiotic Product (PROBIOTIC DAILY PO) Take 1 capsule by mouth daily.   Yes [provider]  RESTASIS 0.05 % ophthalmic emulsion Place 1 drop into both eyes 2 (two) times daily as needed (dry eyes).  12/05/11  Yes [provider]  traMADol (ULTRAM) 50 MG tablet Take  50 mg by mouth every 6 (six) hours as needed for moderate pain.   Yes [provider]  dicyclomine (BENTYL) 20 MG tablet Take 1 tablet (20 mg total) by mouth 2 (two) times daily as needed for spasms. Patient not taking: Reported on 07/06/2018 07/06/16   Carlisle Cater, PA-C  lactulose (CEPHULAC) 10 g packet Take 1 packet (10 g total) daily as needed by mouth (for severe constipation). Patient not taking: Reported on 07/06/2018 01/22/17   Dessa Phi, DO     Allergies:    Allergies  Allergen Reactions   Penicillins Shortness Of Breath and Rash    Has patient had a PCN reaction causing immediate rash, facial/tongue/throat swelling, SOB or lightheadedness with hypotension: Yes Has patient had a PCN reaction causing severe rash involving mucus membranes or skin necrosis: No Has patient had a PCN reaction that required hospitalization No Has patient had a PCN reaction occurring within the last 10 years: No If all of the above answers are "NO", then may proceed with Cephalosporin use.    Tizanidine Shortness Of Breath    Light headed   Ace Inhibitors     Has not tolerated in the past due to hyperkalemia   Antihistamines, Diphenhydramine-Type Other (See Comments)    Inhibits urination   Clemastine Fumarate Other (See Comments)    Inhibits urination   Dimenhydrinate Other (See Comments)    Inhibits urination   Amiodarone Other (See Comments)    Unknown     Social History:   Social History   Socioeconomic History   Marital status: Married    Spouse name: Not on file   Number of children: Y   Years of education: Not on file   Highest education level: Not on file  Occupational History   Occupation: retired. developer.     Employer: RETIRED  Social Designer, fashion/clothing strain: Not on file   Food insecurity:    Worry: Not on file    Inability: Not on file   Transportation needs:    Medical: Not on file    Non-medical: Not on file  Tobacco Use    Smoking status: Former Smoker  Packs/day: 4.00    Years: 15.00    Pack years: 60.00    Types: Cigarettes    Last attempt to quit: 06/15/1969    Years since quitting: 49.0   Smokeless tobacco: Never Used  Substance and Sexual Activity   Alcohol use: No   Drug use: No   Sexual activity: Yes  Lifestyle   Physical activity:    Days per week: Not on file    Minutes per session: Not on file   Stress: Not on file  Relationships   Social connections:    Talks on phone: Not on file    Gets together: Not on file    Attends religious service: Not on file    Active member of club or organization: Not on file    Attends meetings of clubs or organizations: Not on file    Relationship status: Not on file   Intimate partner violence:    Fear of current or ex partner: Not on file    Emotionally abused: Not on file    Physically abused: Not on file    Forced sexual activity: Not on file  Other Topics Concern   Not on file  Social History Narrative   Not on file    Family History:   The patient's family history includes Diabetes (age of onset: 41) in his mother; Heart disease (age of onset: 7) in his mother; Heart disease (age of onset: 87) in his father.    ROS:  Please see the history of present illness.  All other ROS reviewed and negative.     Physical Exam/Data:   Vitals:   07/06/18 1300 07/06/18 1315 07/06/18 1330 07/06/18 1530  BP: 128/75 105/63 101/73 129/65  Pulse: 70 97 (!) 32 100  Resp: 16 16 16  (!) 26  Temp:      TempSrc:      SpO2: 99% 99% 93% 94%   No intake or output data in the 24 hours ending 07/06/18 1653 Last 3 Weights 05/08/2018 02/28/2018 02/28/2018  Weight (lbs) 165 lb 12.8 oz 168 lb 2 oz 168 lb 2 oz  Weight (kg) 75.206 kg 76.261 kg 76.261 kg     There is no height or weight on file to calculate BMI.   Physical Exam per MD.   General:  Well nourished, well developed, in no acute distress HEENT: normal Lymph: no adenopathy Neck: no  JVD Endocrine:  No thryomegaly Vascular: No carotid bruits; FA pulses 2+ bilaterally without bruits  Cardiac:  normal S1, S2; RRR; no murmur  Lungs:  clear to auscultation bilaterally, no wheezing, rhonchi or rales  Abd: soft, nontender, no hepatomegaly  Ext: no edema Musculoskeletal:  No deformities, BUE and BLE strength normal and equal Skin: warm and dry  Neuro:  CNs 2-12 intact, no focal abnormalities noted Psych:  Normal affect    EKG:  The ECG that was done 07/06/18 was personally reviewed and demonstrates Afib rate controlled with PVC  Relevant CV Studies:  TTE: 12/19  Study Conclusions  - Left ventricle: The cavity size was moderately dilated. Systolic   function was severely reduced. The estimated ejection fraction   was in the range of 20% to 25%. Diffuse hypokinesis with akinesis   of the mid to apical inferosepal myocardium. Doppler parameters   are consistent with high ventricular filling pressure. - Aortic valve: Transvalvular velocity was within the normal range.   There was no stenosis. There was mild perivalvular regurgitation.   Peak velocity (S): 201  cm/s. Mean gradient (S): 8 mm Hg. Valve   area (VTI): 1.78 cm^2. Valve area (Vmax): 1.73 cm^2. Valve area   (Vmean): 1.67 cm^2. Regurgitation pressure half-time: 691 ms. - Mitral valve: Transvalvular velocity was within the normal range.   There was no evidence for stenosis. There was mild regurgitation. - Left atrium: The atrium was severely dilated. - Right ventricle: The cavity size was mildly dilated. Wall   thickness was normal. Systolic function was normal. - Right atrium: The atrium was moderately dilated. - Tricuspid valve: There was mild regurgitation. - Pulmonary arteries: Systolic pressure was within the normal   range. PA peak pressure: 29 mm Hg (S).  Laboratory Data:  Chemistry Recent Labs  Lab 07/06/18 1114  NA 124*  K 4.4  CL 90*  CO2 24  GLUCOSE 94  BUN 11  CREATININE 0.86  CALCIUM  8.7*  GFRNONAA >60  GFRAA >60  ANIONGAP 10    No results for input(s): PROT, ALBUMIN, AST, ALT, ALKPHOS, BILITOT in the last 168 hours. Hematology Recent Labs  Lab 07/06/18 1114  WBC 7.6  RBC 3.94*  HGB 12.1*  HCT 36.8*  MCV 93.4  MCH 30.7  MCHC 32.9  RDW 13.5  PLT 209   Cardiac Enzymes Recent Labs  Lab 07/06/18 1114  TROPONINI <0.03   No results for input(s): TROPIPOC in the last 168 hours.  BNPNo results for input(s): BNP, PROBNP in the last 168 hours.  DDimer No results for input(s): DDIMER in the last 168 hours.  Radiology/Studies:  No results found.  Assessment and Plan:   [name] is a 85 y.o. male with PMH of CABG in '79 and redo CABG '91, ICM (EF 25%), TAVR '12, permanent Afib, HL, Stroke, PE who presented with back pain and syncope.    1. Syncope with reported VT: Reported Jeffrey was in his chair having back pain, unsure whether Jeffrey passed out. Device interrogated in the ED and reported to have had VT during this episode. [name] contact [employer] rep for strips. Plan to start Mexiletine 150mg  BID per [name]. Does have a reported allergy to Amiodarone, but reported sleep disturbance.   2. Permanent Afib: rate controlled on admission.  -- coumadin per PharmD -- continue home coreg 6.25mg  BID  3. CAD: s/p CABG '79 and redo '91. Has been on 81mg  ASA historically. [name] continue for now. Follow Hgb.   4. ICM s/p ICD: known EF of 25%. Followed by Dr. Caryl Comes as an outpatient. HF therapy has been limited 2/2 to hypotension and frailty.   5. Hyponatremia: appears to be chronic as labs back in dec note the same.  -- follow BMET  Severity of Illness: The appropriate patient status for this patient is OBSERVATION. Observation status is judged to be reasonable and necessary in order to provide the required intensity of service to ensure the patient's safety. The patient's presenting symptoms, physical exam findings, and initial radiographic and laboratory data in the  context of their medical condition is felt to place them at decreased risk for further clinical deterioration. Furthermore, it is anticipated that the patient [name] be medically stable for discharge from the hospital within 2 midnights of admission. The following factors support the patient status of observation.   " The patient's presenting symptoms include syncope, weakness. " The physical exam findings include stable but frail. " The initial radiographic and laboratory data are interrogation with VT.   For questions or updates, please contact [name] Please consult [email or web address] for  contact info under    Signed, Jeffrey Bellis, NP  07/06/2018 4:53 PM   I have seen and examined this patient with [name].  Agree with above, note added to reflect my findings.  On exam, RRR, no murmurs, lungs clear.   Patient present in the emergency room after episode of syncope.  Device interrogation shows apparently episodes of ventricular tachycardia which were not terminated by ATP.  Due to that, Jeffrey [name] plan to admit the patient to the hospital.  [name] likely need antiarrhythmics.  [name] start him on mexiletine today.  Jeffrey does have severe obstructive coronary artery disease and has had 2 bypass operations.  Jeffrey does have a history of VT and has had ATP therapy in the past.  Jeffrey Miklo Aken plan to monitor him overnight.  If his VT remains quiesced sent, Khandi Kernes plan for possible discharge tomorrow.  Maceo Hernan M. Airyanna Dipalma MD 07/06/2018 5:26 PM

## 2018-07-06 NOTE — ED Notes (Signed)
Help patient stand to use the urinal patient is back in bed with call bell in reach also go patient a warm blanket

## 2018-07-06 NOTE — ED Notes (Signed)
ED TO INPATIENT HANDOFF REPORT  ED Nurse Name and Phone #:  S Name/Age/Gender [name] 85 y.o. male Room/Bed: 010C/010C  Code Status   Code Status: Prior  Home/SNF/Other Home Patient oriented to: self, place, time and situation Is this baseline? Yes   Triage Complete: Triage complete  Chief Complaint sick  Triage Note Patient arrives via [facility] from home, patients wife called out stating that patient was c/o back pain while sitting and then had an episode that lasted about 1 minute where patient was unresponsive, no jerking movements noted, no hx of seizures. Patient also c/o generalized weakness and more difficulty ambulating recently VSS: bp 142/74, HR 60-90afib w/hx of the same, RR 18 104 cbg 98% on room air. Patient a/ox4, speech clear, moves all limbs equally. Pacemaker/defibrillator in place. Pt also reports cough occasionally for the past few weeks and some nausea this am after syncopal episode but denies any recent sick contacts, no fevers, chills body aches.  PT given drink and a Happy meal.   Allergies Allergies  Allergen Reactions  . Penicillins Shortness Of Breath and Rash    Has patient had a PCN reaction causing immediate rash, facial/tongue/throat swelling, SOB or lightheadedness with hypotension: Yes Has patient had a PCN reaction causing severe rash involving mucus membranes or skin necrosis: No Has patient had a PCN reaction that required hospitalization No Has patient had a PCN reaction occurring within the last 10 years: No If all of the above answers are "NO", then may proceed with Cephalosporin use.   . Tizanidine Shortness Of Breath    Light headed  . Ace Inhibitors     Has not tolerated in the past due to hyperkalemia  . Antihistamines, Diphenhydramine-Type Other (See Comments)    Inhibits urination  . Clemastine Fumarate Other (See Comments)    Inhibits urination  . Dimenhydrinate Other (See Comments)    Inhibits urination  . Amiodarone  Other (See Comments)    Unknown     Level of Care/Admitting Diagnosis ED Disposition    ED Disposition Condition Comment   Admit  Hospital Area: [facility] [[id number]]  Level of Care: Telemetry Cardiac [103]  Covid Evaluation: N/A  Diagnosis: Ventricular tachyarrhythmia [facility]) [[id number]]  Admitting Physician: Elouise Munroe [[id number]]  Attending Physician: Elouise Munroe [[id number]]  PT Class (Do Not Modify): Observation [104]  PT Acc Code (Do Not Modify): Observation [10022]       B Medical/Surgery History Past Medical History:  Diagnosis Date  . AF (atrial fibrillation) ([place])    Has not tolerated amiodarone in the past. Amiodarone was stopped in September of 2010 due to side effects  . Aortic stenosis, severe    WITH PERCUTANEOUS AORTIC VALVE (TAVI) IN March 2012 at the [facility]  . Cervical myelopathy ([place])   . CHF (congestive heart failure) ([place])   . Chronic anticoagulation    on coumadin  . Coronary artery disease   . Diverticulitis    CURRENTLY CONTROLLED WITH NO EVIDENCE OF RECURRENT INFECTION  . Esophageal stricture    WITH DILATATION  . GERD (gastroesophageal reflux disease)   . Heart murmur   . Hyperlipidemia   . Ischemic cardiomyopathy 05/2010   Has EF of 25%  . MI (myocardial infarction) ([place]) 1974, 1978  . NSVT (nonsustained ventricular tachycardia) ([place])   . Osteoarthritis    RIGHT KNEE  . Osteoporosis   . Prostate cancer ([place])   . Pulmonary embolism ([place])   . Stroke [facility])  3 strokes   Past Surgical History:  Procedure Laterality Date  . AORTIC VALVE REPLACEMENT     Percutaneous AVR in March 2012 at the [facility]  . BACK SURGERY    . CARDIAC CATHETERIZATION  2010   SEVERE LV DYSFUNCTION WITH ESTIMATED EJECTION FRACTION OF 25%  . CARDIOVERSION  02/16/2011   Procedure: CARDIOVERSION;  Surgeon: Carlena Bjornstad, MD;  Location: [facility];  Service: Cardiovascular;  Laterality: N/A;  . CHOLECYSTECTOMY    . CORONARY ARTERY  BYPASS GRAFT  1979  . CORONARY ARTERY BYPASS GRAFT  1991   REDO SURGERY  . ICD  Feb 2003; 02/2013   gen change 02-11-2013 by Dr Caryl Comes  . IMPLANTABLE CARDIOVERTER DEFIBRILLATOR (ICD) GENERATOR CHANGE N/A 02/10/2013   Procedure: ICD GENERATOR CHANGE;  Surgeon: Deboraha Sprang, MD;  Location: [facility] CATH LAB;  Service: Cardiovascular;  Laterality: N/A;  . SHOULDER SURGERY       A IV Location/Drains/Wounds Patient Lines/Drains/Airways Status   Active Line/Drains/Airways    Name:   Placement date:   Placement time:   Site:   Days:   Urethral Catheter Greg Double-lumen 14 Fr.   01/20/17    2049    Double-lumen   532   Urethral Catheter Terri Doster RN Coude 18 Fr.   01/25/17    0319    Coude   527   Urethral Catheter Maddie RN Coude 16 Fr.   02/28/18    1147    Coude   128   Pressure Injury 01/21/17 Stage I -  Intact skin with non-blanchable redness of a localized area usually over a bony prominence.    01/21/17    0743     531   Pressure Injury 01/21/17 Stage I -  Intact skin with non-blanchable redness of a localized area usually over a bony prominence.    01/21/17    0743     531   Wound / Incision (Open or Dehisced) 01/21/17 Knee Anterior;Right Old wound   01/21/17    0015    Knee   531   Wound / Incision (Open or Dehisced) 01/21/17 Non-pressure wound Leg Anterior;Lower;Right;Left non-painful, no swelling   01/21/17    0743    Leg   531   Wound / Incision (Open or Dehisced) 01/21/17 Other (Comment) Ankle Left;Lateral non-tender, no drainage or swelling; foam dressing applied   01/21/17    0743    Ankle   531          Intake/Output Last 24 hours  Intake/Output Summary (Last 24 hours) at 07/06/2018 1715 Last data filed at 07/06/2018 1700 Gross per 24 hour  Intake -  Output 160 ml  Net -160 ml    Labs/Imaging Results for orders placed or performed during the hospital encounter of 07/06/18 (from the past 48 hour(s))  Basic metabolic panel     Status: Abnormal   Collection Time: 07/06/18  11:14 AM  Result Value Ref Range   Sodium 124 (L) 135 - 145 mmol/L   Potassium 4.4 3.5 - 5.1 mmol/L   Chloride 90 (L) 98 - 111 mmol/L   CO2 24 22 - 32 mmol/L   Glucose, Bld 94 70 - 99 mg/dL   BUN 11 8 - 23 mg/dL   Creatinine, Ser 0.86 0.61 - 1.24 mg/dL   Calcium 8.7 (L) 8.9 - 10.3 mg/dL   GFR calc non Af Amer >60 >60 mL/min   GFR calc Af Amer >60 >60 mL/min   Anion  gap 10 5 - 15    Comment: Performed at [facility], [place] [address] [place], [place] [address]  Troponin I - Once     Status: None   Collection Time: 07/06/18 11:14 AM  Result Value Ref Range   Troponin I <0.03 <0.03 ng/mL    Comment: Performed at [facility] [address] [place], [place] [address]  CBC with Differential     Status: Abnormal   Collection Time: 07/06/18 11:14 AM  Result Value Ref Range   WBC 7.6 4.0 - 10.5 K/uL   RBC 3.94 (L) 4.22 - 5.81 MIL/uL   Hemoglobin 12.1 (L) 13.0 - 17.0 g/dL   HCT 36.8 (L) 39.0 - 52.0 %   MCV 93.4 80.0 - 100.0 fL   MCH 30.7 26.0 - 34.0 pg   MCHC 32.9 30.0 - 36.0 g/dL   RDW 13.5 11.5 - 15.5 %   Platelets 209 150 - 400 K/uL   nRBC 0.0 0.0 - 0.2 %   Neutrophils Relative % 80 %   Neutro Abs 6.0 1.7 - 7.7 K/uL   Lymphocytes Relative 7 %   Lymphs Abs 0.6 (L) 0.7 - 4.0 K/uL   Monocytes Relative 9 %   Monocytes Absolute 0.7 0.1 - 1.0 K/uL   Eosinophils Relative 3 %   Eosinophils Absolute 0.3 0.0 - 0.5 K/uL   Basophils Relative 1 %   Basophils Absolute 0.0 0.0 - 0.1 K/uL   Immature Granulocytes 0 %   Abs Immature Granulocytes 0.03 0.00 - 0.07 K/uL    Comment: Performed at [facility], [place] [address] [place], [place] [address]  Protime-INR     Status: Abnormal   Collection Time: 07/06/18 11:14 AM  Result Value Ref Range   Prothrombin Time 29.3 (H) 11.4 - 15.2 seconds   INR 2.8 (H) 0.8 - 1.2    Comment: (NOTE) INR goal varies based on device and disease states. Performed at [facility], [place] [address] [place], [place] [address]    Urinalysis, Routine w reflex microscopic     Status: Abnormal   Collection Time: 07/06/18 12:35 PM  Result Value Ref Range   Color, Urine YELLOW YELLOW   APPearance CLEAR CLEAR   Specific Gravity, Urine 1.012 1.005 - 1.030   pH 7.0 5.0 - 8.0   Glucose, UA NEGATIVE NEGATIVE mg/dL   Hgb urine dipstick NEGATIVE NEGATIVE   Bilirubin Urine NEGATIVE NEGATIVE   Ketones, ur 20 (A) NEGATIVE mg/dL   Protein, ur NEGATIVE NEGATIVE mg/dL   Nitrite NEGATIVE NEGATIVE   Leukocytes,Ua NEGATIVE NEGATIVE    Comment: Performed at [facility] [address], [place], [place] [address]   *Note: Due to a large number of results and/or encounters for the requested time period, some results have not been displayed. A complete set of results can be found in Results Review.   Dg Chest Port 1 View  Result Date: 07/06/2018 CLINICAL DATA:  Syncope EXAM: PORTABLE CHEST 1 VIEW COMPARISON:  09/01/2015 FINDINGS: Cardiac enlargement. TAVR. AICD. Negative for heart failure or edema. Atherosclerotic aortic arch. Mild bibasilar atelectasis/infiltrate.  No effusion. IMPRESSION: Mild bibasilar airspace disease likely atelectasis. Negative for heart failure. Electronically Signed   By: Franchot Gallo M.D.   On: 07/06/2018 17:08    Pending Labs Unresulted Labs (From admission, onward)    Start     Ordered   07/07/18 0500  Protime-INR  Daily,   R  07/06/18 1710   07/06/18 1621  Troponin I - Once  Once,   STAT     07/06/18 1620   07/06/18 1621  Brain natriuretic peptide  Once,   STAT     07/06/18 1620          Vitals/Pain Today's Vitals   07/06/18 1330 07/06/18 1530 07/06/18 1615 07/06/18 1700  BP: 101/73 129/65 124/81 137/84  Pulse: (!) 32 100 (!) 105 (!) 105  Resp: 16 (!) 26 16 18   Temp:      TempSrc:      SpO2: 93% 94% 97% 95%  PainSc:        Isolation Precautions No active isolations  Medications Medications  carvedilol (COREG) tablet 6.25 mg (6.25 mg Oral Given 07/06/18 1713)  furosemide  (LASIX) tablet 20 mg (has no administration in time range)  levothyroxine (SYNTHROID) tablet 125 mcg (has no administration in time range)  pantoprazole (PROTONIX) EC tablet 40 mg (40 mg Oral Given 07/06/18 1713)  traMADol (ULTRAM) tablet 50 mg (has no administration in time range)  warfarin (COUMADIN) tablet 5 mg (has no administration in time range)  Warfarin - Pharmacist Dosing Inpatient (has no administration in time range)    Mobility      Moderate fall risk   Focused Assessments Cardiac Assessment Handoff:    Lab Results  Component Value Date   CKTOTAL 53 12/03/2012   CKMB 2.3 02/18/2010   TROPONINI <0.03 07/06/2018   Lab Results  Component Value Date   DDIMER  02/17/2010    <0.22        AT THE INHOUSE ESTABLISHED CUTOFF VALUE OF 0.48 ug/mL FEU, THIS ASSAY HAS BEEN DOCUMENTED IN THE LITERATURE TO HAVE A SENSITIVITY AND NEGATIVE PREDICTIVE VALUE OF AT LEAST 98 TO 99%.  THE TEST RESULT SHOULD BE CORRELATED WITH AN ASSESSMENT OF THE CLINICAL PROBABILITY OF DVT / VTE.   Does the Patient currently have chest pain? No     R Recommendations: See Admitting Provider Note  Report given to:   Additional Notes:

## 2018-07-06 NOTE — Progress Notes (Signed)
ANTICOAGULATION CONSULT NOTE - Initial Consult  Pharmacy Consult for warfarin Indication: atrial fibrillation  Allergies  Allergen Reactions  . Penicillins Shortness Of Breath and Rash    Has patient had a PCN reaction causing immediate rash, facial/tongue/throat swelling, SOB or lightheadedness with hypotension: Yes Has patient had a PCN reaction causing severe rash involving mucus membranes or skin necrosis: No Has patient had a PCN reaction that required hospitalization No Has patient had a PCN reaction occurring within the last 10 years: No If all of the above answers are "NO", then may proceed with Cephalosporin use.   . Tizanidine Shortness Of Breath    Light headed  . Ace Inhibitors     Has not tolerated in the past due to hyperkalemia  . Antihistamines, Diphenhydramine-Type Other (See Comments)    Inhibits urination  . Clemastine Fumarate Other (See Comments)    Inhibits urination  . Dimenhydrinate Other (See Comments)    Inhibits urination  . Amiodarone Other (See Comments)    Unknown    Vital Signs: Temp: 97.9 F (36.6 C) (04/25 1106) Temp Source: Oral (04/25 1106) BP: 129/65 (04/25 1530) Pulse Rate: 100 (04/25 1530)  Labs: Recent Labs    07/05/18 1115 07/06/18 1114  HGB  --  12.1*  HCT  --  36.8*  PLT  --  209  LABPROT  --  29.3*  INR 2.4 2.8*  CREATININE  --  0.86  TROPONINI  --  <0.03    CrCl cannot be calculated (Unknown ideal weight.).   Medical History: Past Medical History:  Diagnosis Date  . AF (atrial fibrillation) ([place])    Has not tolerated amiodarone in the past. Amiodarone was stopped in September of 2010 due to side effects  . Aortic stenosis, severe    WITH PERCUTANEOUS AORTIC VALVE (TAVI) IN March 2012 at the [facility]  . Cervical myelopathy ([place])   . CHF (congestive heart failure) ([place])   . Chronic anticoagulation    on coumadin  . Coronary artery disease   . Diverticulitis    CURRENTLY CONTROLLED WITH NO EVIDENCE OF  RECURRENT INFECTION  . Esophageal stricture    WITH DILATATION  . GERD (gastroesophageal reflux disease)   . Heart murmur   . Hyperlipidemia   . Ischemic cardiomyopathy 05/2010   Has EF of 25%  . MI (myocardial infarction) ([place]) 1974, 1978  . NSVT (nonsustained ventricular tachycardia) ([place])   . Osteoarthritis    RIGHT KNEE  . Osteoporosis   . Prostate cancer ([place])   . Pulmonary embolism ([place])   . Stroke [facility])    3 strokes    Assessment: 6 yoM with PMH significant for atrial fibrillation on warfarin PTA. Patient is followed outpatient by cardiology for anticoagulation management. On chart review, patient's INR was 2.4 on 07/05/18 and patient was instructed to take warfarin 7.5 mg PO x 1, then resume warfarin 5 mg PO daily. On admission patient's INR therapeutic at 2.8 and CBC is stable.  PTA warfarin dosing: warfarin 5 mg PO daily   Goal of Therapy:  INR 2.5 - 3.0 Monitor platelets by anticoagulation protocol: Yes   Plan:  Give warfarin 5 mg PO x 1 tonight Monitor daily INR Monitor for s/sx of bleeding  Thank you for allowing pharmacy to be a part of this patient's care.  Leron Croak, PharmD PGY1 Pharmacy Resident Phone: [phone]  Please check AMION for all [place] phone numbers 07/06/2018,5:06 PM

## 2018-07-07 DIAGNOSIS — I252 Old myocardial infarction: Secondary | ICD-10-CM | POA: Diagnosis not present

## 2018-07-07 DIAGNOSIS — Z8673 Personal history of transient ischemic attack (TIA), and cerebral infarction without residual deficits: Secondary | ICD-10-CM | POA: Diagnosis not present

## 2018-07-07 DIAGNOSIS — I5022 Chronic systolic (congestive) heart failure: Secondary | ICD-10-CM | POA: Diagnosis not present

## 2018-07-07 DIAGNOSIS — Z952 Presence of prosthetic heart valve: Secondary | ICD-10-CM | POA: Diagnosis not present

## 2018-07-07 DIAGNOSIS — I2510 Atherosclerotic heart disease of native coronary artery without angina pectoris: Secondary | ICD-10-CM | POA: Diagnosis not present

## 2018-07-07 DIAGNOSIS — R55 Syncope and collapse: Secondary | ICD-10-CM | POA: Diagnosis not present

## 2018-07-07 DIAGNOSIS — Z87891 Personal history of nicotine dependence: Secondary | ICD-10-CM | POA: Diagnosis not present

## 2018-07-07 DIAGNOSIS — I472 Ventricular tachycardia: Secondary | ICD-10-CM | POA: Diagnosis not present

## 2018-07-07 DIAGNOSIS — Z8546 Personal history of malignant neoplasm of prostate: Secondary | ICD-10-CM | POA: Diagnosis not present

## 2018-07-07 DIAGNOSIS — Z9049 Acquired absence of other specified parts of digestive tract: Secondary | ICD-10-CM | POA: Diagnosis not present

## 2018-07-07 DIAGNOSIS — Z7982 Long term (current) use of aspirin: Secondary | ICD-10-CM | POA: Diagnosis not present

## 2018-07-07 DIAGNOSIS — Z79899 Other long term (current) drug therapy: Secondary | ICD-10-CM | POA: Diagnosis not present

## 2018-07-07 LAB — PROTIME-INR
INR: 3.2 — ABNORMAL HIGH (ref 0.8–1.2)
Prothrombin Time: 32.3 seconds — ABNORMAL HIGH (ref 11.4–15.2)

## 2018-07-07 LAB — BASIC METABOLIC PANEL
Anion gap: 10 (ref 5–15)
BUN: 10 mg/dL (ref 8–23)
CO2: 24 mmol/L (ref 22–32)
Calcium: 8.4 mg/dL — ABNORMAL LOW (ref 8.9–10.3)
Chloride: 91 mmol/L — ABNORMAL LOW (ref 98–111)
Creatinine, Ser: 0.89 mg/dL (ref 0.61–1.24)
GFR calc Af Amer: >60 mL/min (ref >60)
GFR calc non Af Amer: >60 mL/min (ref >60)
Glucose, Bld: 87 mg/dL (ref 70–99)
Potassium: 4.3 mmol/L (ref 3.5–5.1)
Sodium: 125 mmol/L — ABNORMAL LOW (ref 135–145)

## 2018-07-07 MED ORDER — WARFARIN SODIUM 5 MG PO TABS: 5.0000 mg | ORAL_TABLET | ORAL | Status: AC

## 2018-07-07 MED ORDER — MEXILETINE HCL 150 MG PO CAPS: 150.0000 mg | ORAL_CAPSULE | Freq: Two times a day (BID) | ORAL | 3 refills | Status: AC

## 2018-07-07 NOTE — Discharge Summary (Addendum)
Discharge Summary    Patient ID: [name],  MRN: [id number], DOB/AGE: [birth year] 85 y.o.  Admit date: 07/06/2018 Discharge date: 07/07/2018  Primary Care Provider: [name] Primary Cardiologist: Dr. Candyce Churn  Discharge Diagnoses    Active Problems:   Ventricular tachyarrhythmia [facility])   Syncope   Allergies Allergies  Allergen Reactions   Penicillins Shortness Of Breath and Rash    Has patient had a PCN reaction causing immediate rash, facial/tongue/throat swelling, SOB or lightheadedness with hypotension: Yes Has patient had a PCN reaction causing severe rash involving mucus membranes or skin necrosis: No Has patient had a PCN reaction that required hospitalization No Has patient had a PCN reaction occurring within the last 10 years: No If all of the above answers are "NO", then may proceed with Cephalosporin use.    Tizanidine Shortness Of Breath    Light headed   Ace Inhibitors     Has not tolerated in the past due to hyperkalemia   Antihistamines, Diphenhydramine-Type Other (See Comments)    Inhibits urination   Clemastine Fumarate Other (See Comments)    Inhibits urination   Dimenhydrinate Other (See Comments)    Inhibits urination   Amiodarone Other (See Comments)    Unknown     Diagnostic Studies/Procedures    N/a  _____________   History of Present Illness     [name] is a an 85 yo male with PMH of CABG in '79 and redo CABG '91, ICM (EF 25%), TAVR '12, permanent Afib, HL, Stroke, PE who presented with back pain and syncope.  He is followed by Dr. Burt Knack, and Dr. Caryl Comes as an outpatient.  He initially underwent CABG in 1997 with redo CABG in 1991.  Has a longstanding history of severe ischemic cardiomyopathy with an EF of 25%.  He underwent TAVR at [facility] in 2012 and has been followed for mild to moderate paravalvular aortic insufficiency since that time.  He also has a history of permanent atrial fibrillation and is  maintained on long-term anticoagulation with Coumadin He has had trouble tolerating optimization of heart failure medications in the past secondary to hypotension and frailty.  Also has a known history of ventricular tachycardia with ICD which is followed by Dr. Caryl Comes.  He was last seen in the office by Dr. Burt Knack on 05/08/2018 and was reported to have issues with aspiration and frequent coughing whenever he eats.  At that time was scheduled to have a barium swallow.  Overall generally remained weak and unsteady but was able to ambulate with a walker at home.  From a cardiac viewpoint he denied any symptoms of chest pain or shortness of breath, no lower extremity edema.  He had no lightheadedness or palpitations.  Did note an episode of tachycardia with pacing for treatment of VT.  He had been paced out of VT without receiving a shock.  He was notified by the device clinic that he should not drive for 6 months.  It is noted that he has not driven for quite some time regardless.  Most recent echocardiogram on 10/19 showed EF of 20 to 25% with diffuse hypokinesis with akinesis in the mid to apical inferior septal myocardium with severely dilated left atrium and moderately dilated right atrium.   He presented to the ED on 07/06/2018 via EMS.  Per patient's wife he was complaining of back pain while sitting at home which is not unusual for him but then had a witnessed episode of unresponsiveness  that lasted for about a minute.  No reported jerking movements or seizure-like activity.  Wife reported that he did fall the day prior but did not sustain an injury.  On EMS arrival complained of generalized weakness, but blood pressure and heart rate were noted to be stable.  CBG was 104.  He was alert and oriented with clear speech.   In the ED his labs showed Na + 124 (which appears to be chronically low), other electrolytes stable, Trop neg, Hgb 12.1, INR 2.8. EKG showed rate controlled Afib with PVCs. CXR without edema.  Device was interrogated in the ED and reported to have had episode of VT during this near syncopal event. He as admitted for further management.   Hospital Course     1.  Syncope associated with ventricular tachycardia: In review of his device interrogation, he had ventricular tachycardia treated with 2 rounds of ATP.  He did not convert after the second round of ATP, but did convert spontaneously prior to any further therapy.  He has been put on mexiletine 150 mg twice a day in the hospital.  Unfortunately in the past, he had not tolerated amiodarone and thus this is to be avoided.  At this point it was not felt that his tachycardia was due to ischemia as he has had quite a bit of VT in the past and it is likely due to scar.  We Ambert Virrueta continue mexiletine. He was advised no driving for the next 6 months.  2.  Permanent atrial fibrillation: Currently rate controlled.  Continue Coreg.  Continue warfarin.  3.  Coronary artery disease: Status post CABG x2.  Continue aspirin.  4.  Ischemic cardiomyopathy: Ejection fraction 25%.  No obvious volume overload.  5.  Hyponatremia: Appears to be stable and chronic.  No changes.  [name] was seen by Dr. Curt Bears and determined stable for discharge home. Follow up in the office has been arranged. Medications are listed below. Message sent to the Afib Clinic for evisit.   _____________  Discharge Vitals Blood pressure 107/79, pulse 71, temperature 97.6 F (36.4 C), temperature source Oral, resp. rate 18, height 6\' 2"  (1.88 m), weight 75.5 kg, SpO2 98 %.  Filed Weights   07/06/18 1816  Weight: 75.5 kg    Labs & Radiologic Studies    CBC Recent Labs    07/06/18 1114  WBC 7.6  NEUTROABS 6.0  HGB 12.1*  HCT 36.8*  MCV 93.4  PLT 564   Basic Metabolic Panel Recent Labs    07/06/18 1114 07/06/18 1823 07/07/18 0321  NA 124*  --  125*  K 4.4  --  4.3  CL 90*  --  91*  CO2 24  --  24  GLUCOSE 94  --  87  BUN 11  --  10  CREATININE  0.86  --  0.89  CALCIUM 8.7*  --  8.4*  MG  --  2.0  --    Liver Function Tests No results for input(s): AST, ALT, ALKPHOS, BILITOT, PROT, ALBUMIN in the last 72 hours. No results for input(s): LIPASE, AMYLASE in the last 72 hours. Cardiac Enzymes Recent Labs    07/06/18 1114 07/06/18 1643  TROPONINI <0.03 <0.03   BNP Invalid input(s): POCBNP D-Dimer No results for input(s): DDIMER in the last 72 hours. Hemoglobin A1C No results for input(s): HGBA1C in the last 72 hours. Fasting Lipid Panel No results for input(s): CHOL, HDL, LDLCALC, TRIG, CHOLHDL, LDLDIRECT in the last 72 hours.  Thyroid Function Tests Recent Labs    07/06/18 1823  TSH 4.131   _____________  Dg Chest Port 1 View  Result Date: 07/06/2018 CLINICAL DATA:  Syncope EXAM: PORTABLE CHEST 1 VIEW COMPARISON:  09/01/2015 FINDINGS: Cardiac enlargement. TAVR. AICD. Negative for heart failure or edema. Atherosclerotic aortic arch. Mild bibasilar atelectasis/infiltrate.  No effusion. IMPRESSION: Mild bibasilar airspace disease likely atelectasis. Negative for heart failure. Electronically Signed   By: Franchot Gallo M.D.   On: 07/06/2018 17:08   Disposition   Pt is being discharged home today in good condition.  Follow-up Plans & Appointments    Follow-up Information    [facility] Follow up.   Specialty:  Cardiology Why:  The office Merridy Pascoe call you to setup an appt for a tele-health evisit through your mobile phone.  Contact information: [address] [id number] [place] [phone]         Discharge Instructions    Diet - low sodium heart healthy   Complete by:  As directed    Discharge instructions   Complete by:  As directed    No DRIVING for 6 months.   Increase activity slowly   Complete by:  As directed        Discharge Medications     Medication List    STOP taking these medications   dicyclomine 20 MG tablet Commonly known  as:  BENTYL   lactulose 10 g packet Commonly known as:  CEPHULAC     TAKE these medications   albuterol 108 (90 Base) MCG/ACT inhaler Commonly known as:  VENTOLIN HFA Inhale 1-2 puffs into the lungs every 6 (six) hours as needed for wheezing or shortness of breath.   alfuzosin 10 MG 24 hr tablet Commonly known as:  UROXATRAL Take 10 mg by mouth at bedtime.   aspirin 81 MG tablet Take 81 mg by mouth daily.   benzonatate 100 MG capsule Commonly known as:  TESSALON Take 100 mg by mouth 3 (three) times daily as needed for cough.   carvedilol 6.25 MG tablet Commonly known as:  COREG TAKE 1 TABLET BY MOUTH TWICE DAILY WITH A MEAL. What changed:  See the new instructions.   cholecalciferol 1000 units tablet Commonly known as:  VITAMIN D Take 1,000 Units by mouth 2 (two) times daily.   DELSYM PO Take 15 mLs by mouth as needed (cough).   famotidine 40 MG tablet Commonly known as:  PEPCID Take 40 mg by mouth daily.   furosemide 20 MG tablet Commonly known as:  LASIX TAKE (1) TABLET DAILY AS NEEDED FOR SWELLING. What changed:  See the new instructions.   ipratropium 0.03 % nasal spray Commonly known as:  ATROVENT Place 2 sprays into both nostrils 2 (two) times daily.   levothyroxine 125 MCG tablet Commonly known as:  Synthroid Take 1 tablet (125 mcg total) by mouth daily before breakfast.   Linzess 145 MCG Caps capsule Generic drug:  linaclotide Take 145 mcg by mouth daily before breakfast.   mexiletine 150 MG capsule Commonly known as:  MEXITIL Take 1 capsule (150 mg total) by mouth 2 (two) times a day.   nitroGLYCERIN 0.4 MG SL tablet Commonly known as:  Nitrostat Place 1 tablet (0.4 mg total) under the tongue every 5 (five) minutes x 3 doses as needed for chest pain (Max 3 doses in 15 min. Call 911).   omeprazole 20 MG capsule Commonly known as:  PRILOSEC Take 20 mg by mouth  daily as needed (acid reflux).   polyethylene glycol 17 g packet Commonly known as:   MIRALAX / GLYCOLAX Take 17 g by mouth daily as needed for mild constipation.   PROBIOTIC DAILY PO Take 1 capsule by mouth daily.   Restasis 0.05 % ophthalmic emulsion Generic drug:  cycloSPORINE Place 1 drop into both eyes 2 (two) times daily as needed (dry eyes).   SYMBICORT IN Inhale 1 puff into the lungs 2 (two) times daily.   traMADol 50 MG tablet Commonly known as:  ULTRAM Take 50 mg by mouth every 6 (six) hours as needed for moderate pain.   warfarin 5 MG tablet Commonly known as:  Coumadin Take as directed. If you are unsure how to take this medication, talk to your nurse or doctor. Original instructions:  Take 1-1.5 tablets (5-7.5 mg total) by mouth See admin instructions. Take 7.5 mg by mouth only on Fridays; all other days take 5mg  What changed:  See the new instructions.        Acute coronary syndrome (MI, NSTEMI, STEMI, etc) this admission?: No.     Outstanding Labs/Studies   N/a   Duration of Discharge Encounter   Greater than 30 minutes including physician time.  Signed, Reino Bellis NP-C 07/07/2018, 9:08 AM  I have seen and examined this patient with Reino Bellis.  Agree with above, note added to reflect my findings.  On exam, iRRR, no murmurs, lungs clear.  Patient into the hospital yesterday with syncope and VT.  He has since been put on mexiletine.  No further arrhythmias overnight.  We Issabella Rix plan for discharge today with follow-up in clinic.  Lusero Nordlund M. Sherece Gambrill MD 07/07/2018 9:41 AM

## 2018-07-07 NOTE — Progress Notes (Signed)
Colon for warfarin Indication: atrial fibrillation  Allergies  Allergen Reactions  . Penicillins Shortness Of Breath and Rash    Has patient had a PCN reaction causing immediate rash, facial/tongue/throat swelling, SOB or lightheadedness with hypotension: Yes Has patient had a PCN reaction causing severe rash involving mucus membranes or skin necrosis: No Has patient had a PCN reaction that required hospitalization No Has patient had a PCN reaction occurring within the last 10 years: No If all of the above answers are "NO", then may proceed with Cephalosporin use.   . Tizanidine Shortness Of Breath    Light headed  . Ace Inhibitors     Has not tolerated in the past due to hyperkalemia  . Antihistamines, Diphenhydramine-Type Other (See Comments)    Inhibits urination  . Clemastine Fumarate Other (See Comments)    Inhibits urination  . Dimenhydrinate Other (See Comments)    Inhibits urination  . Amiodarone Other (See Comments)    Unknown    Vital Signs: Temp Source: Oral (04/26 0518) BP: 107/79 (04/26 0518) Pulse Rate: 71 (04/26 0518)  Labs: Recent Labs    07/05/18 1115 07/06/18 1114 07/06/18 1643 07/07/18 0321  HGB  --  12.1*  --   --   HCT  --  36.8*  --   --   PLT  --  209  --   --   LABPROT  --  29.3*  --  32.3*  INR 2.4 2.8*  --  3.2*  CREATININE  --  0.86  --  0.89  TROPONINI  --  <0.03 <0.03  --     Estimated Creatinine Clearance: 66 mL/min (by C-G formula based on SCr of 0.89 mg/dL).   Medical History: Past Medical History:  Diagnosis Date  . AF (atrial fibrillation) ([name])    Has not tolerated amiodarone in the past. Amiodarone was stopped in September of 2010 due to side effects  . Aortic stenosis, severe    WITH PERCUTANEOUS AORTIC VALVE (TAVI) IN March 2012 at the [facility]  . Cervical myelopathy ([name])   . CHF (congestive heart failure) ([name])   . Chronic anticoagulation    on coumadin  . Coronary  artery disease   . Diverticulitis    CURRENTLY CONTROLLED WITH NO EVIDENCE OF RECURRENT INFECTION  . Esophageal stricture    WITH DILATATION  . GERD (gastroesophageal reflux disease)   . Heart murmur   . Hyperlipidemia   . Ischemic cardiomyopathy 05/2010   Has EF of 25%  . MI (myocardial infarction) ([name]) 1974, 1978  . NSVT (nonsustained ventricular tachycardia) ([name])   . Osteoarthritis    RIGHT KNEE  . Osteoporosis   . Prostate cancer ([name])   . Pulmonary embolism ([name])   . Stroke [name])    3 strokes    Assessment: 89 yoM with PMH significant for atrial fibrillation on warfarin PTA. Patient is followed outpatient by cardiology for anticoagulation management. On chart review, patient's INR was 2.4 on 07/05/18 and patient was instructed to take warfarin 7.5 mg PO x 1, then resume warfarin 5 mg PO daily. On admission patient's INR therapeutic at 2.8 and CBC is stable.  PTA warfarin dosing: warfarin 5 mg PO daily   Goal of Therapy:  INR 2.5 - 3.0 Monitor platelets by anticoagulation protocol: Yes   Plan:  Give warfarin 5 mg PO x 1 tonight- INR a bit above goal, but had "boosted" dose on 4/24 PTA so I think he will  trend back down. Monitor daily INR Monitor for s/sx of bleeding  Thank you for allowing pharmacy to be a part of this patient's care.  Marguerite Olea, [facility] Clinical Pharmacist Phone [phone]  07/07/2018 10:41 AM

## 2018-07-07 NOTE — Progress Notes (Signed)
Progress Note  Patient Name: [name] Date of Encounter: 07/07/2018  Primary Cardiologist: Sherren Mocha, MD   Subjective   Feeling well.  He says that he feels much better than he did yesterday with more energy.  He feels that the mexiletine which was added yesterday has had a positive effect.  Inpatient Medications    Scheduled Meds: . alfuzosin  10 mg Oral QHS  . aspirin EC  81 mg Oral Daily  . carvedilol  6.25 mg Oral BID WC  . cycloSPORINE  1 drop Both Eyes BID  . famotidine  40 mg Oral Daily  . ipratropium  1 spray Each Nare BID  . levothyroxine  125 mcg Oral QAC breakfast  . linaclotide  145 mcg Oral QAC breakfast  . mexiletine  150 mg Oral BID  . mometasone-formoterol  2 puff Inhalation BID  . pantoprazole  40 mg Oral Daily  . Warfarin - Pharmacist Dosing Inpatient   Does not apply q1800   Continuous Infusions:  PRN Meds: albuterol, benzonatate, nitroGLYCERIN, ondansetron (ZOFRAN) IV, polyethylene glycol, traMADol   Vital Signs    Vitals:   07/06/18 1816 07/06/18 2121 07/07/18 0518 07/07/18 0755  BP: (!) 122/96 (!) 104/58 107/79   Pulse: (!) 58 (!) 50 71   Resp: 18  18   Temp: 97.7 F (36.5 C) 97.6 F (36.4 C)    TempSrc: Oral Oral Oral   SpO2: 100% 99% 98% 98%  Weight: 75.5 kg     Height: 6\' 2"  (1.88 m)       Intake/Output Summary (Last 24 hours) at 07/07/2018 0803 Last data filed at 07/07/2018 0352 Gross per 24 hour  Intake 240 ml  Output 660 ml  Net -420 ml   Last 3 Weights 07/06/2018 05/08/2018 02/28/2018  Weight (lbs) 166 lb 6.4 oz 165 lb 12.8 oz 168 lb 2 oz  Weight (kg) 75.479 kg 75.206 kg 76.261 kg      Telemetry    Atrial fibrillation, PVCs- Personally Reviewed  ECG    Atrial fibrillation, PVC, intermittent ventricular pacing- Personally Reviewed  Physical Exam   GEN: No acute distress.   Neck: No JVD Cardiac: RRR, no murmurs, rubs, or gallops.  Respiratory: Clear to auscultation bilaterally. GI: Soft, nontender,  non-distended  MS: No edema; No deformity. Neuro:  Nonfocal  Psych: Normal affect   Labs    Chemistry Recent Labs  Lab 07/06/18 1114 07/07/18 0321  NA 124* 125*  K 4.4 4.3  CL 90* 91*  CO2 24 24  GLUCOSE 94 87  BUN 11 10  CREATININE 0.86 0.89  CALCIUM 8.7* 8.4*  GFRNONAA >60 >60  GFRAA >60 >60  ANIONGAP 10 10     Hematology Recent Labs  Lab 07/06/18 1114  WBC 7.6  RBC 3.94*  HGB 12.1*  HCT 36.8*  MCV 93.4  MCH 30.7  MCHC 32.9  RDW 13.5  PLT 209    Cardiac Enzymes Recent Labs  Lab 07/06/18 1114 07/06/18 1643  TROPONINI <0.03 <0.03   No results for input(s): TROPIPOC in the last 168 hours.   BNP Recent Labs  Lab 07/06/18 1114  BNP 724.5*     DDimer No results for input(s): DDIMER in the last 168 hours.   Radiology    Dg Chest Port 1 View  Result Date: 07/06/2018 CLINICAL DATA:  Syncope EXAM: PORTABLE CHEST 1 VIEW COMPARISON:  09/01/2015 FINDINGS: Cardiac enlargement. TAVR. AICD. Negative for heart failure or edema. Atherosclerotic aortic arch. Mild bibasilar atelectasis/infiltrate.  No effusion. IMPRESSION: Mild bibasilar airspace disease likely atelectasis. Negative for heart failure. Electronically Signed   By: Franchot Gallo M.D.   On: 07/06/2018 17:08    Cardiac Studies   TTE 12/19/17 - Left ventricle: The cavity size was moderately dilated. Systolic   function was severely reduced. The estimated ejection fraction   was in the range of 20% to 25%. Diffuse hypokinesis with akinesis   of the mid to apical inferosepal myocardium. Doppler parameters   are consistent with high ventricular filling pressure. - Aortic valve: Transvalvular velocity was within the normal range.   There was no stenosis. There was mild perivalvular regurgitation.   Peak velocity (S): 201 cm/s. Mean gradient (S): 8 mm Hg. Valve   area (VTI): 1.78 cm^2. Valve area (Vmax): 1.73 cm^2. Valve area   (Vmean): 1.67 cm^2. Regurgitation pressure half-time: 691 ms. - Mitral  valve: Transvalvular velocity was within the normal range.   There was no evidence for stenosis. There was mild regurgitation. - Left atrium: The atrium was severely dilated. - Right ventricle: The cavity size was mildly dilated. Wall   thickness was normal. Systolic function was normal. - Right atrium: The atrium was moderately dilated. - Tricuspid valve: There was mild regurgitation. - Pulmonary arteries: Systolic pressure was within the normal   range. PA peak pressure: 29 mm Hg (S).   Patient Profile     85 y.o. male history of ischemic cardiomyopathy presented to the hospital yesterday after an episode of syncope, found to have ventricular tachycardia on his defibrillator interrogation treated with ATP.  Assessment & Plan    1.  Syncope associated with ventricular tachycardia: In review of his device interrogation, he had ventricular tachycardia treated with 2 rounds of ATP.  He did not convert after the second round of ATP, but did convert spontaneously prior to any further therapy.  He has been put on mexiletine 150 mg twice a day in the hospital.  Unfortunately in the past, he had not tolerated amiodarone and thus this is to be avoided.  At this point I do not feel that his tachycardia is due to ischemia as he has had quite a bit of VT in the past and it is likely due to scar.  We [name] continue mexiletine.  We [name] have him discharged today and follow-up in EP clinic.  I have advised him no driving for the next 6 months.  2.  Permanent atrial fibrillation: Currently rate controlled.  Continue Coreg.  Continue warfarin.  3.  Coronary artery disease: Status post CABG x2.  Continue aspirin.  4.  Ischemic cardiomyopathy: Ejection fraction 25%.  No obvious volume overload.  5.  Hyponatremia: Appears to be stable and chronic.  No changes.    For questions or updates, please contact [place] Please consult [email or web address] for contact info under        Signed, Becky Berberian Meredith Leeds, MD  07/07/2018, 8:03 AM

## 2018-07-08 ENCOUNTER — Ambulatory Visit (INDEPENDENT_AMBULATORY_CARE_PROVIDER_SITE_OTHER): Payer: Medicare Other

## 2018-07-08 ENCOUNTER — Other Ambulatory Visit: Payer: Self-pay

## 2018-07-08 DIAGNOSIS — I5022 Chronic systolic (congestive) heart failure: Secondary | ICD-10-CM | POA: Diagnosis not present

## 2018-07-08 DIAGNOSIS — Z95810 Presence of automatic (implantable) cardiac defibrillator: Secondary | ICD-10-CM

## 2018-07-09 NOTE — Progress Notes (Signed)
EPIC Encounter for ICM Monitoring  Patient Name: [name] is a 85 y.o. male Date: 07/09/2018 Primary Care Physican: [name], MD Primary Cardiologist:[place] Electrophysiologist: Caryl Comes 06/04/2018 Weight:163.6 lbs 07/09/2018 Weight: unknown  Heart Failure questions reviewed, pt asymptomatic.He said he is feeling ok since hospital discharge and denies any fluid symptoms. Hospitalization 4/25-4/26. Per hospital note, device was interrogated in the ED and reported to have had episode of VT during this near syncopal event on 07/06/2018.  Thoracic impedanceabnormal since 06/17/2018 suggesting fluid and trending close to baseline now.   Prescribed:Furosemide 20 mg 1 tablet as needed. Has not needed any Furosemide in last month.   Labs: 02/28/2018 Creatinine 0.85, BUN 17, Potassium 4.4, Sodium 125, GFR >60 04/03/2017 Creatinine0.86, BUN9, Potassium 4.0, Sodium127, GFR>60  Recommendations: No changes. Encouraged to call for fluid symptoms.  Follow-up plan: ICM clinic phone appointment on6/12/2018.   Copy of ICM check sent to [place].   3 month ICM trend: 07/08/2018    1 Year ICM trend:       Rosalene Billings, RN 07/09/2018 10:04 AM

## 2018-07-16 ENCOUNTER — Telehealth: Payer: Self-pay | Admitting: *Deleted

## 2018-07-16 ENCOUNTER — Telehealth (INDEPENDENT_AMBULATORY_CARE_PROVIDER_SITE_OTHER): Payer: Medicare Other | Admitting: Internal Medicine

## 2018-07-16 ENCOUNTER — Encounter: Payer: Self-pay | Admitting: Internal Medicine

## 2018-07-16 VITALS — Ht 74.0 in | Wt 162.2 lb

## 2018-07-16 DIAGNOSIS — I472 Ventricular tachycardia, unspecified: Secondary | ICD-10-CM

## 2018-07-16 DIAGNOSIS — I4821 Permanent atrial fibrillation: Secondary | ICD-10-CM

## 2018-07-16 DIAGNOSIS — I5022 Chronic systolic (congestive) heart failure: Secondary | ICD-10-CM

## 2018-07-16 DIAGNOSIS — M810 Age-related osteoporosis without current pathological fracture: Secondary | ICD-10-CM | POA: Diagnosis not present

## 2018-07-16 DIAGNOSIS — M7022 Olecranon bursitis, left elbow: Secondary | ICD-10-CM | POA: Diagnosis not present

## 2018-07-16 DIAGNOSIS — Z95810 Presence of automatic (implantable) cardiac defibrillator: Secondary | ICD-10-CM

## 2018-07-16 NOTE — Progress Notes (Signed)
Electrophysiology TeleHealth Note   Due to national recommendations of social distancing due to COVID 19, an audio/video telehealth visit is felt to be most appropriate for this patient at this time.  See MyChart message from today for the patient's consent to telehealth for [facility].   Date:  07/16/2018   ID:  [name], DOB 21-May-1932, MRN [id number]  Location: patient's home  Provider location: [address] [place]  Evaluation Performed: Follow-up visit  PCP:  [name], MD  Cardiologist:  [facility] Electrophysiologist:  SK   Chief Complaint:  Ventricular tachycardia   History of Present Illness:    [name] is a 85 y.o. male who presents via audio/video conferencing for a telehealth visit today.  Since last being seen in our clinic, the patient reports having had ATP  treated VT- Monomorphic 1/20 and syncopal VT Rx unsuccessfully with ATP x 2 and then spontaneous conversion;  Started on mexiletine  Tolerating well w just a little nausea  Another episode yesterday with transient LOC and residual confusion.  ; No chest pain; edema, PND or orthopnea ; some DOE; weak  His wife notes that he has been a little testy of late.  Some intermittent confusion but clear today.  ECHO EF 10/19 20-25%  The patient denies symptoms of fevers, chills, cough, or new SOB worrisome for COVID 19.    Past Medical History:  Diagnosis Date  . AF (atrial fibrillation) ([place])    Has not tolerated amiodarone in the past. Amiodarone was stopped in September of 2010 due to side effects  . Aortic stenosis, severe    WITH PERCUTANEOUS AORTIC VALVE (TAVI) IN March 2012 at the [facility]  . Cervical myelopathy ([place])   . CHF (congestive heart failure) ([place])   . Chronic anticoagulation    on coumadin  . Coronary artery disease   . Diverticulitis    CURRENTLY CONTROLLED WITH NO EVIDENCE OF RECURRENT INFECTION  . Esophageal stricture    WITH DILATATION  . GERD  (gastroesophageal reflux disease)   . Heart murmur   . Hyperlipidemia   . Ischemic cardiomyopathy 05/2010   Has EF of 25%  . MI (myocardial infarction) ([place]) 1974, 1978  . NSVT (nonsustained ventricular tachycardia) ([place])   . Osteoarthritis    RIGHT KNEE  . Osteoporosis   . Prostate cancer ([place])   . Pulmonary embolism ([place])   . Stroke [facility])    3 strokes    Past Surgical History:  Procedure Laterality Date  . AORTIC VALVE REPLACEMENT     Percutaneous AVR in March 2012 at the [facility]  . BACK SURGERY    . CARDIAC CATHETERIZATION  2010   SEVERE LV DYSFUNCTION WITH ESTIMATED EJECTION FRACTION OF 25%  . CARDIOVERSION  02/16/2011   Procedure: CARDIOVERSION;  Surgeon: Carlena Bjornstad, MD;  Location: [place];  Service: Cardiovascular;  Laterality: N/A;  . CHOLECYSTECTOMY    . CORONARY ARTERY BYPASS GRAFT  1979  . CORONARY ARTERY BYPASS GRAFT  1991   REDO SURGERY  . ICD  Feb 2003; 02/2013   gen change 02-11-2013 by Dr Caryl Comes  . IMPLANTABLE CARDIOVERTER DEFIBRILLATOR (ICD) GENERATOR CHANGE N/A 02/10/2013   Procedure: ICD GENERATOR CHANGE;  Surgeon: Deboraha Sprang, MD;  Location: [name] CATH LAB;  Service: Cardiovascular;  Laterality: N/A;  . SHOULDER SURGERY      Current Outpatient Medications  Medication Sig Dispense Refill  . albuterol (PROVENTIL HFA;VENTOLIN HFA) 108 (90 Base) MCG/ACT  inhaler Inhale 1-2 puffs into the lungs every 6 (six) hours as needed for wheezing or shortness of breath.    . alfuzosin (UROXATRAL) 10 MG 24 hr tablet Take 10 mg by mouth at bedtime.     Marland Kitchen aspirin 81 MG tablet Take 81 mg by mouth daily.    . benzonatate (TESSALON) 100 MG capsule Take 100 mg by mouth 3 (three) times daily as needed for cough.    . Budesonide-Formoterol Fumarate (SYMBICORT IN) Inhale 1 puff into the lungs 2 (two) times daily.    . carvedilol (COREG) 6.25 MG tablet TAKE 1 TABLET BY MOUTH TWICE DAILY WITH A MEAL. 180 tablet 3  . cholecalciferol (VITAMIN D) 1000 UNITS tablet Take 1,000  Units by mouth 2 (two) times daily.    Marland Kitchen Dextromethorphan Polistirex (DELSYM PO) Take 15 mLs by mouth as needed (cough).    . famotidine (PEPCID) 40 MG tablet Take 40 mg by mouth daily.    . furosemide (LASIX) 20 MG tablet TAKE (1) TABLET DAILY AS NEEDED FOR SWELLING. 30 tablet 0  . ipratropium (ATROVENT) 0.03 % nasal spray Place 2 sprays into both nostrils 2 (two) times daily.    Marland Kitchen levothyroxine (SYNTHROID) 125 MCG tablet Take 1 tablet (125 mcg total) by mouth daily before breakfast. 30 tablet 0  . linaclotide (LINZESS) 145 MCG CAPS capsule Take 145 mcg by mouth daily before breakfast.    . mexiletine (MEXITIL) 150 MG capsule Take 1 capsule (150 mg total) by mouth 2 (two) times a day. 60 capsule 3  . nitroGLYCERIN (NITROSTAT) 0.4 MG SL tablet Place 1 tablet (0.4 mg total) under the tongue every 5 (five) minutes x 3 doses as needed for chest pain (Max 3 doses in 15 min. Call 911). 25 tablet 1  . omeprazole (PRILOSEC) 20 MG capsule Take 20 mg by mouth daily as needed (acid reflux).    . polyethylene glycol (MIRALAX / GLYCOLAX) packet Take 17 g by mouth daily as needed for mild constipation.     . Probiotic Product (PROBIOTIC DAILY PO) Take 1 capsule by mouth daily.    . RESTASIS 0.05 % ophthalmic emulsion Place 1 drop into both eyes 2 (two) times daily as needed (dry eyes).     . traMADol (ULTRAM) 50 MG tablet Take 50 mg by mouth every 6 (six) hours as needed for moderate pain.    Marland Kitchen warfarin (COUMADIN) 5 MG tablet Take 1-1.5 tablets (5-7.5 mg total) by mouth See admin instructions. Take 7.5 mg by mouth only on Fridays; all other days take 5mg      No current facility-administered medications for this visit.     Allergies:   Penicillins; Tizanidine; Ace inhibitors; Antihistamines, diphenhydramine-type; Clemastine fumarate; Dimenhydrinate; and Amiodarone   Social History:  The patient  reports that he quit smoking about 49 years ago. His smoking use included cigarettes. He has a 60.00 pack-year  smoking history. He has never used smokeless tobacco. He reports that he does not drink alcohol or use drugs.   Family History:  The patient's   family history includes Diabetes (age of onset: 35) in his mother; Heart disease (age of onset: 16) in his mother; Heart disease (age of onset: 47) in his father.   ROS:  Please see the history of present illness.   All other systems are personally reviewed and negative.    Exam:    Vital Signs:  Ht 6\' 2"  (1.88 m)   Wt 162 lb 3.2 oz (73.6 kg)  BMI 20.83 kg/m     Well appearing, alert and conversant, regular work of breathing,  good skin color Eyes- anicteric, neuro- grossly intact, skin- no apparent rash or lesions or cyanosis, mouth- oral mucosa is pink   Labs/Other Tests and Data Reviewed:    Recent Labs: 02/28/2018: ALT 11 07/06/2018: B Natriuretic Peptide 724.5; Hemoglobin 12.1; Magnesium 2.0; Platelets 209; TSH 4.131 07/07/2018: BUN 10; Creatinine, Ser 0.89; Potassium 4.3; Sodium 125   Wt Readings from Last 3 Encounters:  07/16/18 162 lb 3.2 oz (73.6 kg)  07/06/18 166 lb 6.4 oz (75.5 kg)  05/08/18 165 lb 12.8 oz (75.2 kg)     Other studies personally reviewed: Additional studies/ records that were reviewed today include: above records  Review of the above records today demonstrates:       Last device remote is reviewed from [place] PDF dated  3/20   which reveals normal device function,   arrhythmias - none     ASSESSMENT & PLAN:    Atrial fibrillation-permanent  Ventricular tachycardia   Congestive heart failure-chronic-systolic  Coronary Artery Disease, s/ p CABG  Ischemic Cardiomyopathy   Implantable defibrillator-[employer]  TAVR    Interval ventricular tachycardia.  Tolerating the mexiletine.  Transient episode of light headedness, difficulty arousing concern for recurrent ventricular tachycardia.  Interrogation was sent and reviewed.  No recurrent ventricular tachycardia.  Continue mexiletine.   Encourage physical therapy.  Discussed with his wife a little bit about the likely prognostic implications of worsening electrical stability in the context of his longstanding cardiomyopathy  Without symptoms of ischemia   COVID 19 screen The patient denies symptoms of COVID 19 at this time.  The importance of social distancing was discussed today.  Follow-up:  5 m Next remote: As Scheduled   Current medicines are reviewed at length with the patient today.   The patient does not have concerns regarding his medicines.  The following changes were made today:  none  Labs/ tests ordered today include:   No orders of the defined types were placed in this encounter.     Patient Risk:  after full review of this patients clinical status, I feel that they are at moderate risk at this time.  Today, I have spent 16  minutes with the patient with telehealth technology discussing the above.  Signed, Virl Axe, MD  07/16/2018 3:01 PM     [facility] [address] [place] [address] [phone] (office) [phone] (fax)

## 2018-07-16 NOTE — Telephone Encounter (Signed)
Spoke with family member, requested manual transmission for Dr. Caryl Comes to review. Pt agrees to send now. Will send to Dr. Caryl Comes for review when received.

## 2018-07-16 NOTE — Telephone Encounter (Signed)
Spoke with Mrs. [name]. Mid-morning yesterday, 5/4, patient had another "spell" where he was "unresponsive." Discussed episode with Dr. Caryl Comes during virtual visit this afternoon. Advised that transmission from today at 07:38 shows 1 NSVT episode on 5/4 at 17:03, but no other monitored or treated episodes. Updated transmission from this afternoon shows no new episodes. Mrs. [name] verbalizes understanding and will make the pt aware.   She reports her biggest concerns are that the pt is more confused and he gets "testy" in the afternoon. She believes he has developed some dementia, which is making it hard for him to communicate his symptoms. Weight and vital signs are stable. Advised I will send this message to Dr. Caryl Comes. Mrs. [name] reports they discussed these issues during the visit today. She denies any additional questions at this time and thanked me for my call.

## 2018-07-16 NOTE — Telephone Encounter (Signed)
Noted  

## 2018-07-16 NOTE — Telephone Encounter (Signed)
Follow up   [name] is calling and said she is trying to do the download with the device but they're getting a question mark    Please call

## 2018-07-19 LAB — POCT INR: INR: 2 (ref 2.0–3.0)

## 2018-07-22 ENCOUNTER — Ambulatory Visit (INDEPENDENT_AMBULATORY_CARE_PROVIDER_SITE_OTHER): Payer: Medicare Other | Admitting: Pharmacist

## 2018-07-22 ENCOUNTER — Other Ambulatory Visit: Payer: Self-pay

## 2018-07-22 ENCOUNTER — Encounter (HOSPITAL_COMMUNITY): Payer: Self-pay | Admitting: Emergency Medicine

## 2018-07-22 ENCOUNTER — Emergency Department (HOSPITAL_COMMUNITY): Payer: Medicare Other

## 2018-07-22 ENCOUNTER — Inpatient Hospital Stay (HOSPITAL_COMMUNITY)
Admission: EM | Admit: 2018-07-22 | Discharge: 2018-07-24 | DRG: 641 | Disposition: A | Discharge status: Home Health | Payer: Medicare Other | Attending: Student | Admitting: Student

## 2018-07-22 DIAGNOSIS — Z7951 Long term (current) use of inhaled steroids: Secondary | ICD-10-CM

## 2018-07-22 DIAGNOSIS — Z8249 Family history of ischemic heart disease and other diseases of the circulatory system: Secondary | ICD-10-CM

## 2018-07-22 DIAGNOSIS — Z95810 Presence of automatic (implantable) cardiac defibrillator: Secondary | ICD-10-CM | POA: Diagnosis present

## 2018-07-22 DIAGNOSIS — I69391 Dysphagia following cerebral infarction: Secondary | ICD-10-CM

## 2018-07-22 DIAGNOSIS — R531 Weakness: Secondary | ICD-10-CM | POA: Diagnosis not present

## 2018-07-22 DIAGNOSIS — I255 Ischemic cardiomyopathy: Secondary | ICD-10-CM | POA: Diagnosis present

## 2018-07-22 DIAGNOSIS — I491 Atrial premature depolarization: Secondary | ICD-10-CM | POA: Diagnosis not present

## 2018-07-22 DIAGNOSIS — K219 Gastro-esophageal reflux disease without esophagitis: Secondary | ICD-10-CM | POA: Diagnosis present

## 2018-07-22 DIAGNOSIS — I4821 Permanent atrial fibrillation: Secondary | ICD-10-CM | POA: Diagnosis present

## 2018-07-22 DIAGNOSIS — Z20828 Contact with and (suspected) exposure to other viral communicable diseases: Secondary | ICD-10-CM | POA: Diagnosis not present

## 2018-07-22 DIAGNOSIS — I482 Chronic atrial fibrillation, unspecified: Secondary | ICD-10-CM

## 2018-07-22 DIAGNOSIS — E860 Dehydration: Secondary | ICD-10-CM | POA: Diagnosis not present

## 2018-07-22 DIAGNOSIS — E871 Hypo-osmolality and hyponatremia: Secondary | ICD-10-CM | POA: Diagnosis present

## 2018-07-22 DIAGNOSIS — R42 Dizziness and giddiness: Secondary | ICD-10-CM | POA: Diagnosis not present

## 2018-07-22 DIAGNOSIS — G959 Disease of spinal cord, unspecified: Secondary | ICD-10-CM | POA: Diagnosis present

## 2018-07-22 DIAGNOSIS — Z8546 Personal history of malignant neoplasm of prostate: Secondary | ICD-10-CM

## 2018-07-22 DIAGNOSIS — M810 Age-related osteoporosis without current pathological fracture: Secondary | ICD-10-CM | POA: Diagnosis present

## 2018-07-22 DIAGNOSIS — Z880 Allergy status to penicillin: Secondary | ICD-10-CM

## 2018-07-22 DIAGNOSIS — R0609 Other forms of dyspnea: Secondary | ICD-10-CM | POA: Diagnosis present

## 2018-07-22 DIAGNOSIS — Z833 Family history of diabetes mellitus: Secondary | ICD-10-CM

## 2018-07-22 DIAGNOSIS — Z952 Presence of prosthetic heart valve: Secondary | ICD-10-CM

## 2018-07-22 DIAGNOSIS — I252 Old myocardial infarction: Secondary | ICD-10-CM

## 2018-07-22 DIAGNOSIS — I5022 Chronic systolic (congestive) heart failure: Secondary | ICD-10-CM | POA: Diagnosis not present

## 2018-07-22 DIAGNOSIS — R0989 Other specified symptoms and signs involving the circulatory and respiratory systems: Secondary | ICD-10-CM | POA: Diagnosis not present

## 2018-07-22 DIAGNOSIS — R55 Syncope and collapse: Secondary | ICD-10-CM | POA: Diagnosis not present

## 2018-07-22 DIAGNOSIS — Z87891 Personal history of nicotine dependence: Secondary | ICD-10-CM

## 2018-07-22 DIAGNOSIS — I4891 Unspecified atrial fibrillation: Secondary | ICD-10-CM | POA: Diagnosis not present

## 2018-07-22 DIAGNOSIS — I5023 Acute on chronic systolic (congestive) heart failure: Secondary | ICD-10-CM

## 2018-07-22 DIAGNOSIS — R0600 Dyspnea, unspecified: Secondary | ICD-10-CM

## 2018-07-22 DIAGNOSIS — Z79890 Hormone replacement therapy (postmenopausal): Secondary | ICD-10-CM

## 2018-07-22 DIAGNOSIS — I472 Ventricular tachycardia: Secondary | ICD-10-CM | POA: Diagnosis not present

## 2018-07-22 DIAGNOSIS — M1711 Unilateral primary osteoarthritis, right knee: Secondary | ICD-10-CM | POA: Diagnosis present

## 2018-07-22 DIAGNOSIS — E039 Hypothyroidism, unspecified: Secondary | ICD-10-CM | POA: Diagnosis present

## 2018-07-22 DIAGNOSIS — E785 Hyperlipidemia, unspecified: Secondary | ICD-10-CM | POA: Diagnosis present

## 2018-07-22 DIAGNOSIS — Z7901 Long term (current) use of anticoagulants: Secondary | ICD-10-CM

## 2018-07-22 DIAGNOSIS — Z5181 Encounter for therapeutic drug level monitoring: Secondary | ICD-10-CM

## 2018-07-22 DIAGNOSIS — Z951 Presence of aortocoronary bypass graft: Secondary | ICD-10-CM

## 2018-07-22 DIAGNOSIS — R001 Bradycardia, unspecified: Secondary | ICD-10-CM | POA: Diagnosis not present

## 2018-07-22 DIAGNOSIS — I2510 Atherosclerotic heart disease of native coronary artery without angina pectoris: Secondary | ICD-10-CM | POA: Diagnosis present

## 2018-07-22 DIAGNOSIS — D649 Anemia, unspecified: Secondary | ICD-10-CM | POA: Diagnosis present

## 2018-07-22 DIAGNOSIS — R011 Cardiac murmur, unspecified: Secondary | ICD-10-CM | POA: Diagnosis present

## 2018-07-22 DIAGNOSIS — Z7982 Long term (current) use of aspirin: Secondary | ICD-10-CM

## 2018-07-22 DIAGNOSIS — R053 Chronic cough: Secondary | ICD-10-CM | POA: Diagnosis present

## 2018-07-22 DIAGNOSIS — Z86711 Personal history of pulmonary embolism: Secondary | ICD-10-CM

## 2018-07-22 DIAGNOSIS — I2699 Other pulmonary embolism without acute cor pulmonale: Secondary | ICD-10-CM | POA: Diagnosis present

## 2018-07-22 DIAGNOSIS — Z888 Allergy status to other drugs, medicaments and biological substances status: Secondary | ICD-10-CM

## 2018-07-22 DIAGNOSIS — R05 Cough: Secondary | ICD-10-CM | POA: Diagnosis present

## 2018-07-22 HISTORY — DX: Age-related physical debility: R54

## 2018-07-22 HISTORY — DX: Hypo-osmolality and hyponatremia: E87.1

## 2018-07-22 HISTORY — DX: Ventricular tachycardia: I47.2

## 2018-07-22 HISTORY — DX: Cough, unspecified: R05.9

## 2018-07-22 HISTORY — DX: Permanent atrial fibrillation: I48.21

## 2018-07-22 HISTORY — DX: Unspecified foreign body in respiratory tract, part unspecified causing other injury, initial encounter: T17.908A

## 2018-07-22 HISTORY — DX: Chronic systolic (congestive) heart failure: I50.22

## 2018-07-22 HISTORY — DX: Ventricular tachycardia, unspecified: I47.20

## 2018-07-22 LAB — PROTIME-INR
INR: 2.6 — ABNORMAL HIGH (ref 0.8–1.2)
Prothrombin Time: 27.4 seconds — ABNORMAL HIGH (ref 11.4–15.2)

## 2018-07-22 LAB — CBC WITH DIFFERENTIAL/PLATELET
Abs Immature Granulocytes: 0.02 10*3/uL (ref 0.00–0.07)
Basophils Absolute: 0.1 10*3/uL (ref 0.0–0.1)
Basophils Relative: 1 %
Eosinophils Absolute: 0.4 10*3/uL (ref 0.0–0.5)
Eosinophils Relative: 6 %
HCT: 33.1 % — ABNORMAL LOW (ref 39.0–52.0)
Hemoglobin: 10.6 g/dL — ABNORMAL LOW (ref 13.0–17.0)
Immature Granulocytes: 0 %
Lymphocytes Relative: 7 %
Lymphs Abs: 0.4 10*3/uL — ABNORMAL LOW (ref 0.7–4.0)
MCH: 30.2 pg (ref 26.0–34.0)
MCHC: 32 g/dL (ref 30.0–36.0)
MCV: 94.3 fL (ref 80.0–100.0)
Monocytes Absolute: 0.7 10*3/uL (ref 0.1–1.0)
Monocytes Relative: 11 %
Neutro Abs: 4.9 10*3/uL (ref 1.7–7.7)
Neutrophils Relative %: 75 %
Platelets: 162 10*3/uL (ref 150–400)
RBC: 3.51 MIL/uL — ABNORMAL LOW (ref 4.22–5.81)
RDW: 13.4 % (ref 11.5–15.5)
WBC: 6.6 10*3/uL (ref 4.0–10.5)
nRBC: 0 % (ref 0.0–0.2)

## 2018-07-22 LAB — LACTIC ACID, PLASMA
Lactic Acid, Venous: 0.8 mmol/L (ref 0.5–1.9)
Lactic Acid, Venous: 1.8 mmol/L (ref 0.5–1.9)

## 2018-07-22 LAB — SARS CORONAVIRUS 2 BY RT PCR (HOSPITAL ORDER, PERFORMED IN ~~LOC~~ HOSPITAL LAB): SARS Coronavirus 2: NEGATIVE

## 2018-07-22 LAB — COMPREHENSIVE METABOLIC PANEL
ALT: 11 U/L (ref 0–44)
AST: 12 U/L — ABNORMAL LOW (ref 15–41)
Albumin: 3 g/dL — ABNORMAL LOW (ref 3.5–5.0)
Alkaline Phosphatase: 49 U/L (ref 38–126)
Anion gap: 11 (ref 5–15)
BUN: 11 mg/dL (ref 8–23)
CO2: 22 mmol/L (ref 22–32)
Calcium: 7.9 mg/dL — ABNORMAL LOW (ref 8.9–10.3)
Chloride: 90 mmol/L — ABNORMAL LOW (ref 98–111)
Creatinine, Ser: 0.84 mg/dL (ref 0.61–1.24)
GFR calc Af Amer: >60 mL/min (ref >60)
GFR calc non Af Amer: >60 mL/min (ref >60)
Glucose, Bld: 95 mg/dL (ref 70–99)
Potassium: 4.4 mmol/L (ref 3.5–5.1)
Sodium: 123 mmol/L — ABNORMAL LOW (ref 135–145)
Total Bilirubin: 0.8 mg/dL (ref 0.3–1.2)
Total Protein: 5.1 g/dL — ABNORMAL LOW (ref 6.5–8.1)

## 2018-07-22 LAB — BRAIN NATRIURETIC PEPTIDE: B Natriuretic Peptide: 793.6 pg/mL — ABNORMAL HIGH (ref 0.0–100.0)

## 2018-07-22 LAB — TROPONIN I: Troponin I: <0.03 ng/mL (ref <0.03)

## 2018-07-22 MED ORDER — SODIUM CHLORIDE 0.9 % IV BOLUS
1000.0000 mL | Freq: Once | INTRAVENOUS | Status: AC
  Administered 2018-07-22: 17:00:00 1000 mL via INTRAVENOUS

## 2018-07-22 MED ORDER — FUROSEMIDE 10 MG/ML IJ SOLN: 20.0000 mg | Freq: Once | INTRAMUSCULAR | Status: DC

## 2018-07-22 MED ORDER — FUROSEMIDE 10 MG/ML IJ SOLN
40.0000 mg | Freq: Once | INTRAMUSCULAR | Status: DC
  Filled 2018-07-22: qty 4

## 2018-07-22 NOTE — ED Notes (Signed)
Cardiology at bedside, states pt needs to receive remainder of IV fluids and hold lasix

## 2018-07-22 NOTE — ED Notes (Signed)
Pt's sps. Eino Farber, [phone]. Pls contact for updates

## 2018-07-22 NOTE — Consult Note (Addendum)
Cardiology Consultation    Patient ID: [name] MRN: [id number], DOB: 05/02/1932 Date of Encounter: 07/22/2018, 8:09 PM Primary Physician: [name], MD Primary Cardiologist: Sherren Mocha, MD Primary Electrophysiologist:  Virl Axe, MD  Chief Complaint: weakness, cough Reason for Admission: possible CHF Requesting MD: Dr. Ellender Hose  HPI: [name] is a 85 y.o. male with history of CAD s/p CABG 1979 and redo 1991, ICM, chronic systolic CHF, AS s/p TAVR 2012, permanent atrial fib, HLD, stroke, frailty, low BP, persistent hyponatremia, ventricular tachycardia, frequent coughing, prior PE, prostate CA whom we are asked to see for weakness, cough and near syncope.  He was last seen in the office by Dr. Burt Knack on 05/08/2018 and was reported to have issues with aspiration and frequent coughing whenever he eats. Esophageal study showed concern for aspiration. He was noted to remain weak and unsteady but was able to ambulate with a walker at home. He had had a recent episode of ATP. He was readmitted 06/2018 with episode of unresponsiveness. Device interrogation he had ventricular tachycardia treated with 2 rounds of ATP. He did not convert after the second round of ATP, but did convert spontaneously prior to any further therapy. He was placed on mexiletine (previously did not tolerate amiodarone). Last echo 12/2017 showed EF of 20 to 25% with diffuse hypokinesis with akinesis in the mid to apical inferior septal myocardium with severely dilated left atrium, mild MR, mild TR, mild perivalvular aortic regurgitation, moderately dilated right atrium. Telemedicine note from 07/16/18 outlines concern for some intermittent confusion lately.  He presented to the hospital with worsening weakness, fatigue, sensation of dehydration and recent severe coughing spell with suspected aspiration. Since that time he's had worsening ongoing cough to the point he had scant hemoptysis. He has not had any lower  extremity edema, orthopnea or chest pain. Labs show negative troponin, sodium 123 (previously 124), BNP 793, Hgb 10.6 (previously 12 range), albumin 3.0. Recent TSH wnl. CXR with cardiomegaly and pulmonary vascular congestion. SARS-CoV2 testing pending. BP has been soft at times. He is afebrile and satting 99% on RA. He was given 238ml fluids by EMS. Cardiology asked to see for possible HF. Weight is down about 6lb from prior admission, and 15lb from last year. Denies any obvious bleeding. Lives at home with wife.  Past Medical History:  Diagnosis Date  . Aortic stenosis, severe    WITH PERCUTANEOUS AORTIC VALVE (TAVI) IN March 2012 at the [facility]  . Aspiration into airway   . Cervical myelopathy ([place])   . Chronic anticoagulation    on coumadin  . Chronic systolic CHF (congestive heart failure) ([place])   . Coronary artery disease    a.  s/p CABG 1979 and redo 1991.  Marland Kitchen Coughing   . Diverticulitis    CURRENTLY CONTROLLED WITH NO EVIDENCE OF RECURRENT INFECTION  . Esophageal stricture    WITH DILATATION  . Frailty   . GERD (gastroesophageal reflux disease)   . Heart murmur   . Hyperlipidemia   . Hyponatremia   . Ischemic cardiomyopathy 05/2010   Has EF of 25%  . MI (myocardial infarction) ([place]) 1974, 1978  . Osteoarthritis    RIGHT KNEE  . Osteoporosis   . Permanent atrial fibrillation    Has not tolerated amiodarone in the past. Amiodarone was stopped in September of 2010 due to side effects.  . Prostate cancer ([place])   . Pulmonary embolism ([place])   . Stroke [facility])    3 strokes  .  VT (ventricular tachycardia) [facility])      Surgical History:  Past Surgical History:  Procedure Laterality Date  . AORTIC VALVE REPLACEMENT     Percutaneous AVR in March 2012 at the [facility]  . BACK SURGERY    . CARDIAC CATHETERIZATION  2010   SEVERE LV DYSFUNCTION WITH ESTIMATED EJECTION FRACTION OF 25%  . CARDIOVERSION  02/16/2011   Procedure: CARDIOVERSION;  Surgeon: Carlena Bjornstad,  MD;  Location: [place];  Service: Cardiovascular;  Laterality: N/A;  . CHOLECYSTECTOMY    . CORONARY ARTERY BYPASS GRAFT  1979  . CORONARY ARTERY BYPASS GRAFT  1991   REDO SURGERY  . ICD  Feb 2003; 02/2013   gen change 02-11-2013 by Dr Caryl Comes  . IMPLANTABLE CARDIOVERTER DEFIBRILLATOR (ICD) GENERATOR CHANGE N/A 02/10/2013   Procedure: ICD GENERATOR CHANGE;  Surgeon: Deboraha Sprang, MD;  Location: [facility] CATH LAB;  Service: Cardiovascular;  Laterality: N/A;  . SHOULDER SURGERY       Home Meds: Prior to Admission medications   Medication Sig Start Date End Date Taking? Authorizing Provider  albuterol (PROVENTIL HFA;VENTOLIN HFA) 108 (90 Base) MCG/ACT inhaler Inhale 1-2 puffs into the lungs every 6 (six) hours as needed for wheezing or shortness of breath.   Yes [provider]  alfuzosin (UROXATRAL) 10 MG 24 hr tablet Take 10 mg by mouth at bedtime.    Yes [provider]  aspirin 81 MG tablet Take 81 mg by mouth daily.   Yes [provider]  benzonatate (TESSALON) 100 MG capsule Take 100 mg by mouth 3 (three) times daily as needed for cough.   Yes [provider]  Budesonide-Formoterol Fumarate (SYMBICORT IN) Inhale 1 puff into the lungs 2 (two) times daily.   Yes [provider]  carvedilol (COREG) 6.25 MG tablet TAKE 1 TABLET BY MOUTH TWICE DAILY WITH A MEAL. Patient taking differently: Take 6.25 mg by mouth 2 (two) times daily with a meal.  07/01/18  Yes Sherren Mocha, MD  cholecalciferol (VITAMIN D) 1000 UNITS tablet Take 1,000 Units by mouth 2 (two) times daily.   Yes [provider]  Dextromethorphan Polistirex (DELSYM PO) Take 10 mLs by mouth every 12 (twelve) hours as needed (cough).    Yes [provider]  famotidine (PEPCID) 40 MG tablet Take 40 mg by mouth daily.   Yes [provider]  furosemide (LASIX) 20 MG tablet TAKE (1) TABLET DAILY AS NEEDED FOR SWELLING. Patient taking differently: Take 20 mg by mouth daily as  needed (swelling).  06/19/16  Yes Sherren Mocha, MD  ipratropium (ATROVENT) 0.03 % nasal spray Place 2 sprays into both nostrils 2 (two) times daily.   Yes [provider]  levothyroxine (SYNTHROID) 125 MCG tablet Take 1 tablet (125 mcg total) by mouth daily before breakfast. 09/02/15  Yes Hosie Poisson, MD  lidocaine (LIDODERM) 5 % Place 1 patch onto the skin daily as needed (pain). Remove & Discard patch within 12 hours or as directed by MD   Yes [provider]  linaclotide (LINZESS) 145 MCG CAPS capsule Take 145 mcg by mouth daily before breakfast.   Yes [provider]  mexiletine (MEXITIL) 150 MG capsule Take 1 capsule (150 mg total) by mouth 2 (two) times a day. 07/07/18  Yes Cheryln Manly, NP  nitroGLYCERIN (NITROSTAT) 0.4 MG SL tablet Place 1 tablet (0.4 mg total) under the tongue every 5 (five) minutes x 3 doses as needed for chest pain (Max 3 doses in 15  min. Call 911). 12/06/16  Yes Sherren Mocha, MD  omeprazole (PRILOSEC) 20 MG capsule Take 20 mg by mouth daily as needed (acid reflux).   Yes [provider]  polyethylene glycol (MIRALAX / GLYCOLAX) packet Take 17 g by mouth daily as needed for mild constipation.    Yes [provider]  Probiotic Product (PROBIOTIC DAILY PO) Take 1 capsule by mouth daily.   Yes [provider]  RESTASIS 0.05 % ophthalmic emulsion Place 1 drop into both eyes 2 (two) times daily as needed (dry eyes).  12/05/11  Yes [provider]  traMADol (ULTRAM) 50 MG tablet Take 50 mg by mouth every 6 (six) hours as needed for moderate pain.   Yes [provider]  warfarin (COUMADIN) 5 MG tablet Take 1-1.5 tablets (5-7.5 mg total) by mouth See admin instructions. Take 7.5 mg by mouth only on Fridays; all other days take 5mg  07/07/18  Yes Cheryln Manly, NP    Allergies:  Allergies  Allergen Reactions  . Penicillins Shortness Of Breath and Rash    Has patient had a PCN reaction causing  immediate rash, facial/tongue/throat swelling, SOB or lightheadedness with hypotension: Yes Has patient had a PCN reaction causing severe rash involving mucus membranes or skin necrosis: No Has patient had a PCN reaction that required hospitalization No Has patient had a PCN reaction occurring within the last 10 years: No If all of the above answers are "NO", then may proceed with Cephalosporin use.   . Tizanidine Shortness Of Breath    Light headed  . Ace Inhibitors     Has not tolerated in the past due to hyperkalemia  . Antihistamines, Diphenhydramine-Type Other (See Comments)    Inhibits urination  . Clemastine Fumarate Other (See Comments)    Inhibits urination  . Dimenhydrinate Other (See Comments)    Inhibits urination  . Amiodarone Other (See Comments)    Unknown     Social History   Socioeconomic History  . Marital status: Married    Spouse name: Not on file  . Number of children: Y  . Years of education: Not on file  . Highest education level: Not on file  Occupational History  . Occupation: retired. developer.     Employer: RETIRED  Social Needs  . Financial resource strain: Not on file  . Food insecurity:    Worry: Not on file    Inability: Not on file  . Transportation needs:    Medical: Not on file    Non-medical: Not on file  Tobacco Use  . Smoking status: Former Smoker    Packs/day: 4.00    Years: 15.00    Pack years: 60.00    Types: Cigarettes    Last attempt to quit: 06/15/1969    Years since quitting: 49.1  . Smokeless tobacco: Never Used  Substance and Sexual Activity  . Alcohol use: No  . Drug use: No  . Sexual activity: Yes  Lifestyle  . Physical activity:    Days per week: Not on file    Minutes per session: Not on file  . Stress: Not on file  Relationships  . Social connections:    Talks on phone: Not on file    Gets together: Not on file    Attends religious service: Not on file    Active member of club or organization: Not on file     Attends meetings of clubs or organizations: Not on file    Relationship status: Not on  file  . Intimate partner violence:    Fear of current or ex partner: Not on file    Emotionally abused: Not on file    Physically abused: Not on file    Forced sexual activity: Not on file  Other Topics Concern  . Not on file  Social History Narrative  . Not on file     Family History  Problem Relation Age of Onset  . Heart disease Mother 39  . Diabetes Mother 39  . Heart disease Father 2    Review of Systems: All other systems reviewed and are otherwise negative except as noted above.  Labs:   Lab Results  Component Value Date   WBC 6.6 07/22/2018   HGB 10.6 (L) 07/22/2018   HCT 33.1 (L) 07/22/2018   MCV 94.3 07/22/2018   PLT 162 07/22/2018    Recent Labs  Lab 07/22/18 1711  NA 123*  K 4.4  CL 90*  CO2 22  BUN 11  CREATININE 0.84  CALCIUM 7.9*  PROT 5.1*  BILITOT 0.8  ALKPHOS 49  ALT 11  AST 12*  GLUCOSE 95   Recent Labs    07/22/18 1711  TROPONINI <0.03   Lab Results  Component Value Date   CHOL 142 02/01/2016   HDL 51 02/01/2016   LDLCALC 77 02/01/2016   TRIG 72 02/01/2016   Lab Results  Component Value Date   DDIMER  02/17/2010    <0.22        AT THE INHOUSE ESTABLISHED CUTOFF VALUE OF 0.48 ug/mL FEU, THIS ASSAY HAS BEEN DOCUMENTED IN THE LITERATURE TO HAVE A SENSITIVITY AND NEGATIVE PREDICTIVE VALUE OF AT LEAST 98 TO 99%.  THE TEST RESULT SHOULD BE CORRELATED WITH AN ASSESSMENT OF THE CLINICAL PROBABILITY OF DVT / VTE.    Radiology/Studies:  Dg Chest Portable 1 View  Result Date: 07/22/2018 CLINICAL DATA:  Shortness of breath today. History of cardiomyopathy. EXAM: PORTABLE CHEST 1 VIEW COMPARISON:  Single-view of the chest 07/06/2018. PA and lateral chest 09/01/2015. CT chest 07/11/2015. FINDINGS: There is cardiomegaly. The patient is status post aortic valve replacement and has an AICD in place. Atherosclerosis is noted. No pneumothorax or  pleural effusion. Pulmonary vascular congestion is noted. No consolidative process is seen. No acute bony abnormality. IMPRESSION: Cardiomegaly and pulmonary vascular congestion. Electronically Signed   By: Inge Rise M.D.   On: 07/22/2018 17:28   Dg Chest Port 1 View  Result Date: 07/06/2018 CLINICAL DATA:  Syncope EXAM: PORTABLE CHEST 1 VIEW COMPARISON:  09/01/2015 FINDINGS: Cardiac enlargement. TAVR. AICD. Negative for heart failure or edema. Atherosclerotic aortic arch. Mild bibasilar atelectasis/infiltrate.  No effusion. IMPRESSION: Mild bibasilar airspace disease likely atelectasis. Negative for heart failure. Electronically Signed   By: Franchot Gallo M.D.   On: 07/06/2018 17:08   Wt Readings from Last 3 Encounters:  07/22/18 73 kg  07/16/18 73.6 kg  07/06/18 75.5 kg    EKG: atrial fib 68bpm atypical LBBB, NSST changes  Physical Exam: Blood pressure (!) 101/57, pulse 73, temperature (!) 97.5 F (36.4 C), temperature source Oral, resp. rate 17, height 6\' 2"  (1.88 m), weight 73 kg, SpO2 99 %. Body mass index is 20.66 kg/m. General: Frail appearing pale WM in no acute distress. Head: Normocephalic, atraumatic, sclera non-icteric, no xanthomas, nares are without discharge.  Neck: Negative for carotid bruits. JVD not elevated. Lungs: Coarse rales bilaterally, worse on the R. Breathing is unlabored. Heart: Irregular rhythm without obvious murmurs, rubs, or gallops appreciated. Abdomen:  Soft, non-tender, non-distended with normoactive bowel sounds. No hepatomegaly. No rebound/guarding. No obvious abdominal masses. Msk:  Strength and tone appear normal for age. Extremities: No clubbing or cyanosis. No edema.  Distal pedal pulses are 2+ and equal bilaterally. Dry skin turgor Neuro: Alert and oriented X 3. No focal deficit. No facial asymmetry. Moves all extremities spontaneously. Psych:  Responds to questions appropriately with a normal affect.    Assessment and Plan   1.  Worsening cough with severe episode of aspiration 2. Weakness, likely multifactorial with failure to thrive and ongoing weight loss 3. Chronic systolic CHF 4. Worsening anemia 5. Persistent hyponatremia, unclear prior workup 6. Permanent atrial fib with controlled rate 7. H/o VT, with ICD in place 8. CAD s/p CABG with redo with negative troponin and no evidence of asthma 9. Recent intermittent confusion with possible dementia  See below for comprehensive thoughts.  For questions or updates, please contact [place] Please consult [email or web address] for contact info under Cardiology/STEMI.  Signed, Charlie Pitter, PA-C (pre-charting done to limit exposure during Covid pandemic) 07/22/2018, 8:09 PM   Attending Note:   The patient was seen and examined.  Agree with assessment and plan as noted above.  Changes made to the above note as needed.  Patient seen and independently examined with  Melina Copa, PA .   We discussed all aspects of the encounter. I agree with the assessment and plan as stated above.  1.   Dyspnea:   [name] has a hx of chronic aspiration and had a particularly bad episode of aspiration 2 days ago .   Has been coughing violently since that time Has not been eating  Has had progressive weight loss He appears to be very volume depleted.   He has severe underlying cardiac disease but this current presentation to the ER does not appear to be cardiac related.   I've discussed with [facility] Hospitalist who will admit. We will follow along   2.  CAD :  No angina  3.  VT:  Has a ICD,   Was seen several weeks ago by Dr. Curt Bears   4.   AFib - permanent .   5/  Chronic systolic CHF:   Hold lasix for now - he is very volume depleted.    I have spent a total of 40 minutes with patient reviewing hospital  notes , telemetry, EKGs, labs and examining patient as well as establishing an assessment and plan that was discussed with the patient. > 50% of time was spent in direct  patient care.   Thayer Headings, Brooke Bonito., MD, [facility] 07/22/2018, 8:17 PM [address]  [place] Pager [phone]

## 2018-07-22 NOTE — ED Notes (Signed)
Nurse Navigator spoke with family and helped pt and wife speak

## 2018-07-22 NOTE — ED Provider Notes (Signed)
[facility] DEPARTMENT Provider Note   CSN: [id number] Arrival date & time: 07/22/18  1609    History   Chief Complaint Chief Complaint  Patient presents with  . Weakness    HPI [name] is a 85 y.o. male.     HPI   85 year old male with extensive past medical history as below including severe aortic stenosis, CHF with EF of 20%, recent admission for nonsustained V. tach, here with multiple complaints.  The patient's primary complaint is that he felt generally weak for the last several days.  He has had very poor appetite.  Over the last several days, he has had increasing coughing spells and difficulty walking.  He said severe shortness of breath.  He has had difficulty even making it across the house, which is new for him.  He was recently admitted for nonsustained V. tach and had been improving, with the exception of a several days.  Is not recall any fevers.  He said no sputum production with his dry cough.  His cough does seem to be worse when he lays flat.  He has had no significant increase in his weight, though it does appear to be intermittently fluctuating per his wife's report over the last week.  No recent medication changes.  Is been adherent with his medication regimen.  No other complaints.  Past Medical History:  Diagnosis Date  . Aortic stenosis, severe    WITH PERCUTANEOUS AORTIC VALVE (TAVI) IN March 2012 at the [facility]  . Aspiration into airway   . Cervical myelopathy ([place])   . Chronic anticoagulation    on coumadin  . Chronic systolic CHF (congestive heart failure) ([place])   . Coronary artery disease    a.  s/p CABG 1979 and redo 1991.  Marland Kitchen Coughing   . Diverticulitis    CURRENTLY CONTROLLED WITH NO EVIDENCE OF RECURRENT INFECTION  . Esophageal stricture    WITH DILATATION  . Frailty   . GERD (gastroesophageal reflux disease)   . Heart murmur   . Hyperlipidemia   . Hyponatremia   . Ischemic cardiomyopathy 05/2010    Has EF of 25%  . MI (myocardial infarction) ([place]) 1974, 1978  . Osteoarthritis    RIGHT KNEE  . Osteoporosis   . Permanent atrial fibrillation    Has not tolerated amiodarone in the past. Amiodarone was stopped in September of 2010 due to side effects.  . Prostate cancer ([place])   . Pulmonary embolism ([place])   . Stroke [facility])    3 strokes  . VT (ventricular tachycardia) [facility])     Patient Active Problem List   Diagnosis Date Noted  . Ventricular tachyarrhythmia ([place]) 07/06/2018  . Syncope 07/06/2018  . Pressure injury of skin 01/21/2017  . Acute urinary retention 01/21/2017  . Shortness of breath   . Weakness 09/01/2015  . Hyponatremia 09/01/2015  . Hypothyroidism 09/01/2015  . DOE (dyspnea on exertion) 09/01/2015  . Chronic systolic CHF (congestive heart failure) ([place]) 09/01/2015  . Exertional dyspnea 09/01/2015  . GI bleed 01/16/2014  . GIB (gastrointestinal bleeding) 01/16/2014  . Encounter for therapeutic drug monitoring 05/09/2013  . Automatic implantable cardioverter-defibrillator in situ 12/13/2012  . Rectal bleed 05/30/2012    Class: Acute  . Acute low back pain 05/30/2012    Class: Acute  . Hypotension 05/30/2012    Class: Acute  . Vertebral fracture, osteoporotic ([place]) 05/21/2012  . Gait instability 12/02/2011    Class: Acute  . Atrial fibrillation,  chronic 12/02/2011    Class: Chronic  . Anticoagulated on Coumadin 12/02/2011  . Deformity, finger acquired 12/19/2010  . Chronic cough 12/05/2010  . S/P aortic valve replacement 10/22/2010  . Ischemic cardiomyopathy   . Diverticulitis   . Prostate cancer ([place])   . Osteoarthritis   . Cervical myelopathy ([place])   . Pulmonary embolism ([place])   . Hyperlipidemia   . GERD (gastroesophageal reflux disease)   . Esophageal stricture   . Indigestion 06/29/2010  . VENTRICULAR TACHYCARDIA 01/15/2009    Past Surgical History:  Procedure Laterality Date  . AORTIC VALVE REPLACEMENT     Percutaneous AVR in March 2012 at  the [facility]  . BACK SURGERY    . CARDIAC CATHETERIZATION  2010   SEVERE LV DYSFUNCTION WITH ESTIMATED EJECTION FRACTION OF 25%  . CARDIOVERSION  02/16/2011   Procedure: CARDIOVERSION;  Surgeon: Carlena Bjornstad, MD;  Location: [facility];  Service: Cardiovascular;  Laterality: N/A;  . CHOLECYSTECTOMY    . CORONARY ARTERY BYPASS GRAFT  1979  . CORONARY ARTERY BYPASS GRAFT  1991   REDO SURGERY  . ICD  Feb 2003; 02/2013   gen change 02-11-2013 by Dr Caryl Comes  . IMPLANTABLE CARDIOVERTER DEFIBRILLATOR (ICD) GENERATOR CHANGE N/A 02/10/2013   Procedure: ICD GENERATOR CHANGE;  Surgeon: Deboraha Sprang, MD;  Location: [facility] CATH LAB;  Service: Cardiovascular;  Laterality: N/A;  . SHOULDER SURGERY          Home Medications    Prior to Admission medications   Medication Sig Start Date End Date Taking? Authorizing Provider  albuterol (PROVENTIL HFA;VENTOLIN HFA) 108 (90 Base) MCG/ACT inhaler Inhale 1-2 puffs into the lungs every 6 (six) hours as needed for wheezing or shortness of breath.   Yes [provider]  alfuzosin (UROXATRAL) 10 MG 24 hr tablet Take 10 mg by mouth at bedtime.    Yes [provider]  aspirin 81 MG tablet Take 81 mg by mouth daily.   Yes [provider]  benzonatate (TESSALON) 100 MG capsule Take 100 mg by mouth 3 (three) times daily as needed for cough.   Yes [provider]  Budesonide-Formoterol Fumarate (SYMBICORT IN) Inhale 1 puff into the lungs 2 (two) times daily.   Yes [provider]  carvedilol (COREG) 6.25 MG tablet TAKE 1 TABLET BY MOUTH TWICE DAILY WITH A MEAL. Patient taking differently: Take 6.25 mg by mouth 2 (two) times daily with a meal.  07/01/18  Yes Sherren Mocha, MD  cholecalciferol (VITAMIN D) 1000 UNITS tablet Take 1,000 Units by mouth 2 (two) times daily.   Yes [provider]  Dextromethorphan Polistirex (DELSYM PO) Take 10 mLs by mouth every 12 (twelve) hours as needed (cough).    Yes [provider]  famotidine (PEPCID) 40 MG tablet Take 40 mg by mouth daily.   Yes [provider]  furosemide (LASIX) 20 MG tablet TAKE (1) TABLET DAILY AS NEEDED FOR SWELLING. Patient taking differently: Take 20 mg by mouth daily as needed (swelling).  06/19/16  Yes Sherren Mocha, MD  ipratropium (ATROVENT) 0.03 % nasal spray Place 2 sprays into both nostrils 2 (two) times daily.   Yes [provider]  levothyroxine (SYNTHROID) 125 MCG tablet Take 1 tablet (125 mcg total) by mouth daily before breakfast. 09/02/15  Yes Hosie Poisson, MD  lidocaine (LIDODERM) 5 % Place 1 patch onto the skin daily as needed (pain). Remove & Discard patch within 12 hours or as directed by MD  Yes [provider]  linaclotide (LINZESS) 145 MCG CAPS capsule Take 145 mcg by mouth daily before breakfast.   Yes [provider]  mexiletine (MEXITIL) 150 MG capsule Take 1 capsule (150 mg total) by mouth 2 (two) times a day. 07/07/18  Yes Cheryln Manly, NP  nitroGLYCERIN (NITROSTAT) 0.4 MG SL tablet Place 1 tablet (0.4 mg total) under the tongue every 5 (five) minutes x 3 doses as needed for chest pain (Max 3 doses in 15 min. Call 911). 12/06/16  Yes Sherren Mocha, MD  omeprazole (PRILOSEC) 20 MG capsule Take 20 mg by mouth daily as needed (acid reflux).   Yes [provider]  polyethylene glycol (MIRALAX / GLYCOLAX) packet Take 17 g by mouth daily as needed for mild constipation.    Yes [provider]  Probiotic Product (PROBIOTIC DAILY PO) Take 1 capsule by mouth daily.   Yes [provider]  RESTASIS 0.05 % ophthalmic emulsion Place 1 drop into both eyes 2 (two) times daily as needed (dry eyes).  12/05/11  Yes [provider]  traMADol (ULTRAM) 50 MG tablet Take 50 mg by mouth every 6 (six) hours as needed for moderate pain.   Yes [provider]  warfarin (COUMADIN) 5 MG tablet Take 1-1.5 tablets (5-7.5 mg total) by mouth See admin  instructions. Take 7.5 mg by mouth only on Fridays; all other days take 5mg  07/07/18  Yes Cheryln Manly, NP    Family History Family History  Problem Relation Age of Onset  . Heart disease Mother 18  . Diabetes Mother 70  . Heart disease Father 44    Social History Social History   Tobacco Use  . Smoking status: Former Smoker    Packs/day: 4.00    Years: 15.00    Pack years: 60.00    Types: Cigarettes    Last attempt to quit: 06/15/1969    Years since quitting: 49.1  . Smokeless tobacco: Never Used  Substance Use Topics  . Alcohol use: No  . Drug use: No     Allergies   Penicillins; Tizanidine; Ace inhibitors; Antihistamines, diphenhydramine-type; Clemastine fumarate; Dimenhydrinate; and Amiodarone   Review of Systems Review of Systems  Constitutional: Positive for fatigue. Negative for chills and fever.  HENT: Negative for congestion and rhinorrhea.   Eyes: Negative for visual disturbance.  Respiratory: Positive for shortness of breath. Negative for cough and wheezing.   Cardiovascular: Negative for chest pain and leg swelling.  Gastrointestinal: Negative for abdominal pain, diarrhea, nausea and vomiting.  Genitourinary: Negative for dysuria and flank pain.  Musculoskeletal: Negative for neck pain and neck stiffness.  Skin: Negative for rash and wound.  Allergic/Immunologic: Negative for immunocompromised state.  Neurological: Positive for weakness and light-headedness. Negative for syncope and headaches.  All other systems reviewed and are negative.    Physical Exam Updated Vital Signs BP (!) 101/57   Pulse 73   Temp (!) 97.5 F (36.4 C) (Oral)   Resp 17   Ht 6\' 2"  (1.88 m)   Wt 73 kg   SpO2 99%   BMI 20.66 kg/m   Physical Exam Vitals signs and nursing note reviewed.  Constitutional:      General: He is not in acute distress.    Appearance: He is well-developed.  HENT:     Head: Normocephalic and atraumatic.     Comments: Dry MM Eyes:      Conjunctiva/sclera: Conjunctivae normal.  Neck:     Musculoskeletal: Neck supple.  Cardiovascular:     Rate and Rhythm: Normal rate and regular rhythm.     Heart sounds: Normal heart sounds. No murmur. No friction rub.  Pulmonary:     Effort: Pulmonary effort is normal. Tachypnea present. No respiratory distress.     Breath sounds: Examination of the right-lower field reveals rales. Examination of the left-lower field reveals rales. Decreased breath sounds and rales present. No wheezing.  Abdominal:     General: There is no distension.     Palpations: Abdomen is soft.     Tenderness: There is no abdominal tenderness.  Skin:    General: Skin is warm.     Capillary Refill: Capillary refill takes less than 2 seconds.  Neurological:     Mental Status: He is alert and oriented to person, place, and time.     Motor: No abnormal muscle tone.      ED Treatments / Results  Labs (all labs ordered are listed, but only abnormal results are displayed) Labs Reviewed  CBC WITH DIFFERENTIAL/PLATELET - Abnormal; Notable for the following components:      Result Value   RBC 3.51 (*)    Hemoglobin 10.6 (*)    HCT 33.1 (*)    Lymphs Abs 0.4 (*)    All other components within normal limits  COMPREHENSIVE METABOLIC PANEL - Abnormal; Notable for the following components:   Sodium 123 (*)    Chloride 90 (*)    Calcium 7.9 (*)    Total Protein 5.1 (*)    Albumin 3.0 (*)    AST 12 (*)    All other components within normal limits  BRAIN NATRIURETIC PEPTIDE - Abnormal; Notable for the following components:   B Natriuretic Peptide 793.6 (*)    All other components within normal limits  SARS CORONAVIRUS 2 (HOSPITAL ORDER, [facility])  TROPONIN I  LACTIC ACID, PLASMA  LACTIC ACID, PLASMA  URINALYSIS, ROUTINE W REFLEX MICROSCOPIC  PROTIME-INR    EKG EKG Interpretation  Date/Time:  Monday Jul 22 2018 16:27:21 EDT Ventricular Rate:  68 PR Interval:    QRS Duration:  131 QT Interval:  480 QTC Calculation: 511 R Axis:   89 Text Interpretation:  Atrial fibrillation IVCD, consider atypical LBBB No significant change since last tracing Confirmed by Duffy Bruce [phone]) on 07/22/2018 5:00:24 PM   Radiology Dg Chest Portable 1 View  Result Date: 07/22/2018 CLINICAL DATA:  Shortness of breath today. History of cardiomyopathy. EXAM: PORTABLE CHEST 1 VIEW COMPARISON:  Single-view of the chest 07/06/2018. PA and lateral chest 09/01/2015. CT chest 07/11/2015. FINDINGS: There is cardiomegaly. The patient is status post aortic valve replacement and has an AICD in place. Atherosclerosis is noted. No pneumothorax or pleural effusion. Pulmonary vascular congestion is noted. No consolidative process is seen. No acute bony abnormality. IMPRESSION: Cardiomegaly and pulmonary vascular congestion. Electronically Signed   By: Inge Rise M.D.   On: 07/22/2018 17:28    Procedures Procedures (including critical care time)  Medications Ordered in ED Medications  furosemide (LASIX) injection 40 mg (has no administration in time range)  sodium chloride 0.9 % bolus 1,000 mL (0 mLs Intravenous Stopped 07/22/18 1748)     Initial Impression / Assessment and Plan / ED Course  I have reviewed the triage vital signs and the nursing notes.  Pertinent labs & imaging results that were available during my care of the patient were reviewed by me and considered in my medical decision making (see chart for details).  86 year old male here with shortness of breath and lightheadedness with exertion.  Clinically, he appears mildly intravascularly dry but hypervolemic on exam with wet rales and wheezes bilaterally.  Chest x-ray and BNP are elevated.  He has acute on chronic hyponatremia likely due to hypervolemia and need for diuresis.  No fever, no signs to suggest pneumonia or COVID.  He does report intermittent aspiration but this appears to be chronic and he has no lobar  consolidation, white count, or evidence suggest aspiration pneumonia.  Discussed with cardiology.  Will give him a dose of Lasix here.  Will admit to cardiology.  Of note, he does report intermittent episodes with aspiration and this could certainly be exacerbating an underlying pulmonary condition, but as mentioned I see no evidence to suggest pneumonia at this time.  Final Clinical Impressions(s) / ED Diagnoses   Final diagnoses:  Acute on chronic systolic congestive heart failure ([place])  Hyponatremia    ED Discharge Orders    None       Duffy Bruce, MD 07/22/18 [phone]

## 2018-07-22 NOTE — ED Triage Notes (Signed)
Pt here via EMS for weakness, cough, near syncope with ambulation with walker for 3 days. No fevers, denies travel or sick contacts. 221ml Fluids given, 18 g L forearm. 115/65, 50 AFIB with demand pacer, 99% room air, RR 18. Pt AO x 4.

## 2018-07-22 NOTE — ED Notes (Signed)
Nurse Navigator helped pt Facetime with family

## 2018-07-22 NOTE — ED Notes (Signed)
ED TO INPATIENT HANDOFF REPORT  ED Nurse Name and Phone #: [phone] Doreene Adas Name/Age/Gender [name] 85 y.o. male Room/Bed: [phone]  Code Status   Code Status: Prior  Home/SNF/Other Home Patient oriented to: self, place, time and situation Is this baseline? Yes   Triage Complete: Triage complete  Chief Complaint weakness near syncope  Triage Note Pt here via EMS for weakness, cough, near syncope with ambulation with walker for 3 days. No fevers, denies travel or sick contacts. 26ml Fluids given, 18 g L forearm. 115/65, 50 AFIB with demand pacer, 99% room air, RR 18. Pt AO x 4.    Allergies Allergies  Allergen Reactions  . Penicillins Shortness Of Breath and Rash    Has patient had a PCN reaction causing immediate rash, facial/tongue/throat swelling, SOB or lightheadedness with hypotension: Yes Has patient had a PCN reaction causing severe rash involving mucus membranes or skin necrosis: No Has patient had a PCN reaction that required hospitalization No Has patient had a PCN reaction occurring within the last 10 years: No If all of the above answers are "NO", then may proceed with Cephalosporin use.   . Tizanidine Shortness Of Breath    Light headed  . Ace Inhibitors     Has not tolerated in the past due to hyperkalemia  . Antihistamines, Diphenhydramine-Type Other (See Comments)    Inhibits urination  . Clemastine Fumarate Other (See Comments)    Inhibits urination  . Dimenhydrinate Other (See Comments)    Inhibits urination  . Amiodarone Other (See Comments)    Unknown     Level of Care/Admitting Diagnosis ED Disposition    ED Disposition Condition Comment   Admit  Hospital Area: [facility] [100100]  Level of Care: Telemetry Medical [104]  I expect the patient will be discharged within 24 hours: No (not a candidate for 5C-Observation unit)  Covid Evaluation: Screening Protocol (No Symptoms)  Diagnosis: Dehydration  [276.51.ICD-9-CM]  Admitting Physician: Rise Patience [phone]  Attending Physician: Rise Patience [phone]  PT Class (Do Not Modify): Observation [104]  PT Acc Code (Do Not Modify): Observation [10022]       B Medical/Surgery History Past Medical History:  Diagnosis Date  . Aortic stenosis, severe    WITH PERCUTANEOUS AORTIC VALVE (TAVI) IN March 2012 at the [facility]  . Aspiration into airway   . Cervical myelopathy ([place])   . Chronic anticoagulation    on coumadin  . Chronic systolic CHF (congestive heart failure) ([place])   . Coronary artery disease    a.  s/p CABG 1979 and redo 1991.  Marland Kitchen Coughing   . Diverticulitis    CURRENTLY CONTROLLED WITH NO EVIDENCE OF RECURRENT INFECTION  . Esophageal stricture    WITH DILATATION  . Frailty   . GERD (gastroesophageal reflux disease)   . Heart murmur   . Hyperlipidemia   . Hyponatremia   . Ischemic cardiomyopathy 05/2010   Has EF of 25%  . MI (myocardial infarction) ([place]) 1974, 1978  . Osteoarthritis    RIGHT KNEE  . Osteoporosis   . Permanent atrial fibrillation    Has not tolerated amiodarone in the past. Amiodarone was stopped in September of 2010 due to side effects.  . Prostate cancer ([place])   . Pulmonary embolism ([place])   . Stroke [facility])    3 strokes  . VT (ventricular tachycardia) ([place])    Past Surgical History:  Procedure Laterality Date  . AORTIC VALVE REPLACEMENT  Percutaneous AVR in March 2012 at the [facility]  . BACK SURGERY    . CARDIAC CATHETERIZATION  2010   SEVERE LV DYSFUNCTION WITH ESTIMATED EJECTION FRACTION OF 25%  . CARDIOVERSION  02/16/2011   Procedure: CARDIOVERSION;  Surgeon: Carlena Bjornstad, MD;  Location: [place];  Service: Cardiovascular;  Laterality: N/A;  . CHOLECYSTECTOMY    . CORONARY ARTERY BYPASS GRAFT  1979  . CORONARY ARTERY BYPASS GRAFT  1991   REDO SURGERY  . ICD  Feb 2003; 02/2013   gen change 02-11-2013 by Dr Caryl Comes  . IMPLANTABLE CARDIOVERTER DEFIBRILLATOR  (ICD) GENERATOR CHANGE N/A 02/10/2013   Procedure: ICD GENERATOR CHANGE;  Surgeon: Deboraha Sprang, MD;  Location: [facility] CATH LAB;  Service: Cardiovascular;  Laterality: N/A;  . SHOULDER SURGERY       A IV Location/Drains/Wounds Patient Lines/Drains/Airways Status   Active Line/Drains/Airways    Name:   Placement date:   Placement time:   Site:   Days:   Peripheral IV 07/06/18 Left Wrist   07/06/18    1900    Wrist   16   Peripheral IV 07/22/18 Right Antecubital   07/22/18    1729    Antecubital   less than 1   Pressure Injury 01/21/17 Stage I -  Intact skin with non-blanchable redness of a localized area usually over a bony prominence.    01/21/17    0743     547   Pressure Injury 01/21/17 Stage I -  Intact skin with non-blanchable redness of a localized area usually over a bony prominence.    01/21/17    0743     547   Wound / Incision (Open or Dehisced) 01/21/17 Non-pressure wound Leg Anterior;Lower;Right;Left non-painful, no swelling   01/21/17    0743    Leg   547   Wound / Incision (Open or Dehisced) 01/21/17 Other (Comment) Ankle Left;Lateral non-tender, no drainage or swelling; foam dressing applied   01/21/17    0743    Ankle   547   Wound / Incision (Open or Dehisced) 07/06/18 Non-pressure wound Buttocks Medial   07/06/18    2045    Buttocks   16          Intake/Output Last 24 hours  Intake/Output Summary (Last 24 hours) at 07/22/2018 2104 Last data filed at 07/22/2018 1748 Gross per 24 hour  Intake 316.35 ml  Output -  Net 316.35 ml    Labs/Imaging Results for orders placed or performed during the hospital encounter of 07/22/18 (from the past 48 hour(s))  CBC with Differential     Status: Abnormal   Collection Time: 07/22/18  5:11 PM  Result Value Ref Range   WBC 6.6 4.0 - 10.5 K/uL   RBC 3.51 (L) 4.22 - 5.81 MIL/uL   Hemoglobin 10.6 (L) 13.0 - 17.0 g/dL   HCT 33.1 (L) 39.0 - 52.0 %   MCV 94.3 80.0 - 100.0 fL   MCH 30.2 26.0 - 34.0 pg   MCHC 32.0 30.0 - 36.0 g/dL   RDW  13.4 11.5 - 15.5 %   Platelets 162 150 - 400 K/uL   nRBC 0.0 0.0 - 0.2 %   Neutrophils Relative % 75 %   Neutro Abs 4.9 1.7 - 7.7 K/uL   Lymphocytes Relative 7 %   Lymphs Abs 0.4 (L) 0.7 - 4.0 K/uL   Monocytes Relative 11 %   Monocytes Absolute 0.7 0.1 - 1.0 K/uL   Eosinophils Relative 6 %  Eosinophils Absolute 0.4 0.0 - 0.5 K/uL   Basophils Relative 1 %   Basophils Absolute 0.1 0.0 - 0.1 K/uL   Immature Granulocytes 0 %   Abs Immature Granulocytes 0.02 0.00 - 0.07 K/uL    Comment: Performed at [facility], [place] [address] [place], [place] [address]  Comprehensive metabolic panel     Status: Abnormal   Collection Time: 07/22/18  5:11 PM  Result Value Ref Range   Sodium 123 (L) 135 - 145 mmol/L   Potassium 4.4 3.5 - 5.1 mmol/L   Chloride 90 (L) 98 - 111 mmol/L   CO2 22 22 - 32 mmol/L   Glucose, Bld 95 70 - 99 mg/dL   BUN 11 8 - 23 mg/dL   Creatinine, Ser 0.84 0.61 - 1.24 mg/dL   Calcium 7.9 (L) 8.9 - 10.3 mg/dL   Total Protein 5.1 (L) 6.5 - 8.1 g/dL   Albumin 3.0 (L) 3.5 - 5.0 g/dL   AST 12 (L) 15 - 41 U/L   ALT 11 0 - 44 U/L   Alkaline Phosphatase 49 38 - 126 U/L   Total Bilirubin 0.8 0.3 - 1.2 mg/dL   GFR calc non Af Amer >60 >60 mL/min   GFR calc Af Amer >60 >60 mL/min   Anion gap 11 5 - 15    Comment: Performed at [facility], [place] [address], [place], [place] [address]  Brain natriuretic peptide     Status: Abnormal   Collection Time: 07/22/18  5:11 PM  Result Value Ref Range   B Natriuretic Peptide 793.6 (H) 0.0 - 100.0 pg/mL    Comment: Performed at [facility] [address], [place], [place] [address]  Troponin I - ONCE - STAT     Status: None   Collection Time: 07/22/18  5:11 PM  Result Value Ref Range   Troponin I <0.03 <0.03 ng/mL    Comment: Performed at [facility] [address] [place], [place] [address]  Lactic acid, plasma     Status: None   Collection Time: 07/22/18  5:11 PM  Result Value Ref Range   Lactic Acid,  Venous 0.8 0.5 - 1.9 mmol/L    Comment: Performed at [facility] [address] [place], [place] [address]  SARS Coronavirus 2 (CEPHEID - Performed in [facility] hospital lab), Hosp Order     Status: None   Collection Time: 07/22/18  5:25 PM  Result Value Ref Range   SARS Coronavirus 2 NEGATIVE NEGATIVE    Comment: (NOTE) If result is NEGATIVE SARS-CoV-2 target nucleic acids are NOT DETECTED. The SARS-CoV-2 RNA is generally detectable in upper and lower  respiratory specimens during the acute phase of infection. The lowest  concentration of SARS-CoV-2 viral copies this assay can detect is 250  copies / mL. A negative result does not preclude SARS-CoV-2 infection  and should not be used as the sole basis for treatment or other  patient management decisions.  A negative result may occur with  improper specimen collection / handling, submission of specimen other  than nasopharyngeal swab, presence of viral mutation(s) within the  areas targeted by this assay, and inadequate number of viral copies  (<250 copies / mL). A negative result must be combined with clinical  observations, patient history, and epidemiological information. If result is POSITIVE SARS-CoV-2 target nucleic acids are DETECTED. The SARS-CoV-2 RNA is generally detectable in upper and lower  respiratory specimens dur ing  the acute phase of infection.  Positive  results are indicative of active infection with SARS-CoV-2.  Clinical  correlation with patient history and other diagnostic information is  necessary to determine patient infection status.  Positive results do  not rule out bacterial infection or co-infection with other viruses. If result is PRESUMPTIVE POSTIVE SARS-CoV-2 nucleic acids MAY BE PRESENT.   A presumptive positive result was obtained on the submitted specimen  and confirmed on repeat testing.  While 2019 novel coronavirus  (SARS-CoV-2) nucleic acids may be present in the submitted sample   additional confirmatory testing may be necessary for epidemiological  and / or clinical management purposes  to differentiate between  SARS-CoV-2 and other Sarbecovirus currently known to infect humans.  If clinically indicated additional testing with an alternate test  methodology [phone]) is advised. The SARS-CoV-2 RNA is generally  detectable in upper and lower respiratory sp ecimens during the acute  phase of infection. The expected result is Negative. Fact Sheet for Patients:  StrictlyIdeas.no Fact Sheet for Healthcare Providers: BankingDealers.co.za This test is not yet approved or cleared by the [place] FDA and has been authorized for detection and/or diagnosis of SARS-CoV-2 by FDA under an Emergency Use Authorization (EUA).  This EUA will remain in effect (meaning this test can be used) for the duration of the COVID-19 declaration under Section 564(b)(1) of the Act, 21 U.S.C. section 360bbb-3(b)(1), unless the authorization is terminated or revoked sooner. Performed at [facility], [place] [address] [place], [place] [address]   Lactic acid, plasma     Status: None   Collection Time: 07/22/18  7:48 PM  Result Value Ref Range   Lactic Acid, Venous 1.8 0.5 - 1.9 mmol/L    Comment: Performed at [facility] [address], [place], [place] [address]  Protime-INR     Status: Abnormal   Collection Time: 07/22/18  7:48 PM  Result Value Ref Range   Prothrombin Time 27.4 (H) 11.4 - 15.2 seconds   INR 2.6 (H) 0.8 - 1.2    Comment: (NOTE) INR goal varies based on device and disease states. Performed at [facility], [place] [address] [place], [place] [address]    *Note: Due to a large number of results and/or encounters for the requested time period, some results have not been displayed. A complete set of results can be found in Results Review.   Dg Chest Portable 1 View  Result Date: 07/22/2018 CLINICAL  DATA:  Shortness of breath today. History of cardiomyopathy. EXAM: PORTABLE CHEST 1 VIEW COMPARISON:  Single-view of the chest 07/06/2018. PA and lateral chest 09/01/2015. CT chest 07/11/2015. FINDINGS: There is cardiomegaly. The patient is status post aortic valve replacement and has an AICD in place. Atherosclerosis is noted. No pneumothorax or pleural effusion. Pulmonary vascular congestion is noted. No consolidative process is seen. No acute bony abnormality. IMPRESSION: Cardiomegaly and pulmonary vascular congestion. Electronically Signed   By: Inge Rise M.D.   On: 07/22/2018 17:28    Pending Labs Unresulted Labs (From admission, onward)    Start     Ordered   07/22/18 1656  Urinalysis, Routine w reflex microscopic  Once,   R     07/22/18 1655          Vitals/Pain Today's Vitals   07/22/18 1900 07/22/18 1932 07/22/18 2003 07/22/18 2015  BP:      Pulse: 73   64  Resp: 17   (!) 22  Temp:  TempSrc:      SpO2: 99%   99%  Weight:      Height:      PainSc:  0-No pain 0-No pain     Isolation Precautions No active isolations  Medications Medications  sodium chloride 0.9 % bolus 1,000 mL (0 mLs Intravenous Stopped 07/22/18 1748)    Mobility walks with device High fall risk   Focused Assessments   R Recommendations: See Admitting Provider Note  Report given to:   Additional Notes:

## 2018-07-22 NOTE — ED Notes (Signed)
EDP states for this RN to stop fluids. Pt given warm blanket

## 2018-07-23 ENCOUNTER — Encounter (HOSPITAL_COMMUNITY): Payer: Self-pay | Admitting: Internal Medicine

## 2018-07-23 ENCOUNTER — Observation Stay (HOSPITAL_COMMUNITY): Payer: Medicare Other

## 2018-07-23 DIAGNOSIS — G959 Disease of spinal cord, unspecified: Secondary | ICD-10-CM | POA: Diagnosis present

## 2018-07-23 DIAGNOSIS — Z95810 Presence of automatic (implantable) cardiac defibrillator: Secondary | ICD-10-CM

## 2018-07-23 DIAGNOSIS — E860 Dehydration: Principal | ICD-10-CM

## 2018-07-23 DIAGNOSIS — R531 Weakness: Secondary | ICD-10-CM | POA: Diagnosis not present

## 2018-07-23 DIAGNOSIS — Z8546 Personal history of malignant neoplasm of prostate: Secondary | ICD-10-CM | POA: Diagnosis not present

## 2018-07-23 DIAGNOSIS — I6782 Cerebral ischemia: Secondary | ICD-10-CM | POA: Diagnosis not present

## 2018-07-23 DIAGNOSIS — I4821 Permanent atrial fibrillation: Secondary | ICD-10-CM | POA: Diagnosis present

## 2018-07-23 DIAGNOSIS — I5022 Chronic systolic (congestive) heart failure: Secondary | ICD-10-CM | POA: Diagnosis present

## 2018-07-23 DIAGNOSIS — I255 Ischemic cardiomyopathy: Secondary | ICD-10-CM | POA: Diagnosis present

## 2018-07-23 DIAGNOSIS — Z952 Presence of prosthetic heart valve: Secondary | ICD-10-CM | POA: Diagnosis not present

## 2018-07-23 DIAGNOSIS — R0609 Other forms of dyspnea: Secondary | ICD-10-CM | POA: Diagnosis not present

## 2018-07-23 DIAGNOSIS — I69391 Dysphagia following cerebral infarction: Secondary | ICD-10-CM | POA: Diagnosis not present

## 2018-07-23 DIAGNOSIS — K219 Gastro-esophageal reflux disease without esophagitis: Secondary | ICD-10-CM | POA: Diagnosis present

## 2018-07-23 DIAGNOSIS — I472 Ventricular tachycardia: Secondary | ICD-10-CM | POA: Diagnosis present

## 2018-07-23 DIAGNOSIS — E785 Hyperlipidemia, unspecified: Secondary | ICD-10-CM | POA: Diagnosis present

## 2018-07-23 DIAGNOSIS — Z20828 Contact with and (suspected) exposure to other viral communicable diseases: Secondary | ICD-10-CM | POA: Diagnosis present

## 2018-07-23 DIAGNOSIS — Z7901 Long term (current) use of anticoagulants: Secondary | ICD-10-CM | POA: Diagnosis not present

## 2018-07-23 DIAGNOSIS — R05 Cough: Secondary | ICD-10-CM | POA: Diagnosis not present

## 2018-07-23 DIAGNOSIS — M1711 Unilateral primary osteoarthritis, right knee: Secondary | ICD-10-CM | POA: Diagnosis present

## 2018-07-23 DIAGNOSIS — Z951 Presence of aortocoronary bypass graft: Secondary | ICD-10-CM | POA: Diagnosis not present

## 2018-07-23 DIAGNOSIS — I5023 Acute on chronic systolic (congestive) heart failure: Secondary | ICD-10-CM | POA: Diagnosis not present

## 2018-07-23 DIAGNOSIS — Z7982 Long term (current) use of aspirin: Secondary | ICD-10-CM | POA: Diagnosis not present

## 2018-07-23 DIAGNOSIS — E039 Hypothyroidism, unspecified: Secondary | ICD-10-CM | POA: Diagnosis present

## 2018-07-23 DIAGNOSIS — E871 Hypo-osmolality and hyponatremia: Secondary | ICD-10-CM | POA: Diagnosis present

## 2018-07-23 DIAGNOSIS — I252 Old myocardial infarction: Secondary | ICD-10-CM | POA: Diagnosis not present

## 2018-07-23 DIAGNOSIS — M810 Age-related osteoporosis without current pathological fracture: Secondary | ICD-10-CM | POA: Diagnosis present

## 2018-07-23 DIAGNOSIS — R1310 Dysphagia, unspecified: Secondary | ICD-10-CM | POA: Diagnosis not present

## 2018-07-23 DIAGNOSIS — I2510 Atherosclerotic heart disease of native coronary artery without angina pectoris: Secondary | ICD-10-CM | POA: Diagnosis present

## 2018-07-23 DIAGNOSIS — Z86711 Personal history of pulmonary embolism: Secondary | ICD-10-CM | POA: Diagnosis not present

## 2018-07-23 DIAGNOSIS — Z79890 Hormone replacement therapy (postmenopausal): Secondary | ICD-10-CM | POA: Diagnosis not present

## 2018-07-23 DIAGNOSIS — R011 Cardiac murmur, unspecified: Secondary | ICD-10-CM | POA: Diagnosis present

## 2018-07-23 LAB — BASIC METABOLIC PANEL
Anion gap: 8 (ref 5–15)
BUN: 8 mg/dL (ref 8–23)
CO2: 23 mmol/L (ref 22–32)
Calcium: 8.2 mg/dL — ABNORMAL LOW (ref 8.9–10.3)
Chloride: 93 mmol/L — ABNORMAL LOW (ref 98–111)
Creatinine, Ser: 0.78 mg/dL (ref 0.61–1.24)
GFR calc Af Amer: >60 mL/min (ref >60)
GFR calc non Af Amer: >60 mL/min (ref >60)
Glucose, Bld: 102 mg/dL — ABNORMAL HIGH (ref 70–99)
Potassium: 4.3 mmol/L (ref 3.5–5.1)
Sodium: 124 mmol/L — ABNORMAL LOW (ref 135–145)

## 2018-07-23 LAB — CBC
HCT: 34.3 % — ABNORMAL LOW (ref 39.0–52.0)
Hemoglobin: 11.5 g/dL — ABNORMAL LOW (ref 13.0–17.0)
MCH: 31 pg (ref 26.0–34.0)
MCHC: 33.5 g/dL (ref 30.0–36.0)
MCV: 92.5 fL (ref 80.0–100.0)
Platelets: 189 10*3/uL (ref 150–400)
RBC: 3.71 MIL/uL — ABNORMAL LOW (ref 4.22–5.81)
RDW: 13.4 % (ref 11.5–15.5)
WBC: 7.2 10*3/uL (ref 4.0–10.5)
nRBC: 0 % (ref 0.0–0.2)

## 2018-07-23 LAB — URINALYSIS, ROUTINE W REFLEX MICROSCOPIC
Bilirubin Urine: NEGATIVE
Glucose, UA: NEGATIVE mg/dL
Hgb urine dipstick: NEGATIVE
Ketones, ur: 15 mg/dL — AB
Leukocytes,Ua: NEGATIVE
Nitrite: NEGATIVE
Protein, ur: NEGATIVE mg/dL
Specific Gravity, Urine: 1.01 (ref 1.005–1.030)
pH: 6.5 (ref 5.0–8.0)

## 2018-07-23 LAB — MAGNESIUM: Magnesium: 1.9 mg/dL (ref 1.7–2.4)

## 2018-07-23 LAB — TSH: TSH: 3.768 u[IU]/mL (ref 0.350–4.500)

## 2018-07-23 MED ORDER — ACETAMINOPHEN 325 MG PO TABS: 650.0000 mg | ORAL_TABLET | Freq: Four times a day (QID) | ORAL | Status: DC | PRN

## 2018-07-23 MED ORDER — FAMOTIDINE 20 MG PO TABS
40.0000 mg | ORAL_TABLET | Freq: Every day | ORAL | Status: DC
  Administered 2018-07-23 – 2018-07-24 (×2): 40 mg via ORAL
  Filled 2018-07-23 (×2): qty 2

## 2018-07-23 MED ORDER — TRAMADOL HCL 50 MG PO TABS: 50.0000 mg | ORAL_TABLET | Freq: Four times a day (QID) | ORAL | Status: DC | PRN

## 2018-07-23 MED ORDER — WARFARIN SODIUM 5 MG PO TABS
5.0000 mg | ORAL_TABLET | ORAL | Status: DC
  Administered 2018-07-23: 18:00:00 5 mg via ORAL
  Filled 2018-07-23: qty 1

## 2018-07-23 MED ORDER — PANTOPRAZOLE SODIUM 40 MG PO TBEC
40.0000 mg | DELAYED_RELEASE_TABLET | Freq: Every day | ORAL | Status: DC
  Administered 2018-07-23 – 2018-07-24 (×2): 40 mg via ORAL
  Filled 2018-07-23 (×2): qty 1

## 2018-07-23 MED ORDER — WARFARIN SODIUM 7.5 MG PO TABS: 7.5000 mg | ORAL_TABLET | ORAL | Status: DC

## 2018-07-23 MED ORDER — LINACLOTIDE 145 MCG PO CAPS
145.0000 ug | ORAL_CAPSULE | Freq: Every day | ORAL | Status: DC
  Administered 2018-07-23 – 2018-07-24 (×2): 145 ug via ORAL
  Filled 2018-07-23 (×2): qty 1

## 2018-07-23 MED ORDER — POLYETHYLENE GLYCOL 3350 17 G PO PACK: 17.0000 g | PACK | Freq: Every day | ORAL | Status: DC | PRN

## 2018-07-23 MED ORDER — LEVOTHYROXINE SODIUM 25 MCG PO TABS
125.0000 ug | ORAL_TABLET | Freq: Every day | ORAL | Status: DC
  Administered 2018-07-23 – 2018-07-24 (×2): 125 ug via ORAL
  Filled 2018-07-23 (×2): qty 1

## 2018-07-23 MED ORDER — CYCLOSPORINE 0.05 % OP EMUL
1.0000 [drp] | Freq: Two times a day (BID) | OPHTHALMIC | Status: DC | PRN
  Filled 2018-07-23: qty 30

## 2018-07-23 MED ORDER — ADULT MULTIVITAMIN W/MINERALS CH
1.0000 | ORAL_TABLET | Freq: Every day | ORAL | Status: DC
  Administered 2018-07-23 – 2018-07-24 (×2): 1 via ORAL
  Filled 2018-07-23 (×2): qty 1

## 2018-07-23 MED ORDER — LIDOCAINE 5 % EX PTCH: 1.0000 | MEDICATED_PATCH | Freq: Every day | CUTANEOUS | Status: DC | PRN

## 2018-07-23 MED ORDER — PRO-STAT SUGAR FREE PO LIQD
30.0000 mL | Freq: Every day | ORAL | Status: DC
  Administered 2018-07-23: 30 mL via ORAL
  Filled 2018-07-23: qty 30

## 2018-07-23 MED ORDER — CARVEDILOL 6.25 MG PO TABS
6.2500 mg | ORAL_TABLET | Freq: Two times a day (BID) | ORAL | Status: DC
  Administered 2018-07-23 – 2018-07-24 (×3): 6.25 mg via ORAL
  Filled 2018-07-23 (×3): qty 1

## 2018-07-23 MED ORDER — WARFARIN - PHARMACIST DOSING INPATIENT: Freq: Every day | Status: DC

## 2018-07-23 MED ORDER — ASPIRIN 81 MG PO CHEW
81.0000 mg | CHEWABLE_TABLET | Freq: Every day | ORAL | Status: DC
  Administered 2018-07-23 – 2018-07-24 (×2): 81 mg via ORAL
  Filled 2018-07-23 (×2): qty 1

## 2018-07-23 MED ORDER — ALFUZOSIN HCL ER 10 MG PO TB24
10.0000 mg | ORAL_TABLET | Freq: Every day | ORAL | Status: DC
  Administered 2018-07-23 (×2): 10 mg via ORAL
  Filled 2018-07-23 (×2): qty 1

## 2018-07-23 MED ORDER — FLUTICASONE FUROATE-VILANTEROL 100-25 MCG/INH IN AEPB
1.0000 | INHALATION_SPRAY | Freq: Every day | RESPIRATORY_TRACT | Status: DC
  Administered 2018-07-23 – 2018-07-24 (×2): 1 via RESPIRATORY_TRACT
  Filled 2018-07-23: qty 28

## 2018-07-23 MED ORDER — BENZONATATE 100 MG PO CAPS: 100.0000 mg | ORAL_CAPSULE | Freq: Three times a day (TID) | ORAL | Status: DC | PRN

## 2018-07-23 MED ORDER — ENSURE ENLIVE PO LIQD
237.0000 mL | Freq: Two times a day (BID) | ORAL | Status: DC
  Administered 2018-07-23 – 2018-07-24 (×2): 237 mL via ORAL

## 2018-07-23 MED ORDER — BUDESONIDE-FORMOTEROL FUMARATE 80-4.5 MCG/ACT IN AERO
2.0000 | INHALATION_SPRAY | Freq: Two times a day (BID) | RESPIRATORY_TRACT | Status: DC
  Filled 2018-07-23: qty 6.9

## 2018-07-23 MED ORDER — ACETAMINOPHEN 650 MG RE SUPP: 650.0000 mg | Freq: Four times a day (QID) | RECTAL | Status: DC | PRN

## 2018-07-23 MED ORDER — MEXILETINE HCL 150 MG PO CAPS
150.0000 mg | ORAL_CAPSULE | Freq: Two times a day (BID) | ORAL | Status: DC
  Administered 2018-07-23 – 2018-07-24 (×3): 150 mg via ORAL
  Filled 2018-07-23 (×5): qty 1

## 2018-07-23 MED ORDER — SODIUM CHLORIDE 0.9 % IV SOLN
INTRAVENOUS | Status: AC
  Administered 2018-07-23: 02:00:00 via INTRAVENOUS

## 2018-07-23 MED ORDER — ALBUTEROL SULFATE (2.5 MG/3ML) 0.083% IN NEBU: 2.5000 mg | INHALATION_SOLUTION | Freq: Four times a day (QID) | RESPIRATORY_TRACT | Status: DC | PRN

## 2018-07-23 MED ORDER — NITROGLYCERIN 0.4 MG SL SUBL: 0.4000 mg | SUBLINGUAL_TABLET | SUBLINGUAL | Status: DC | PRN

## 2018-07-23 MED ORDER — VITAMIN D 25 MCG (1000 UNIT) PO TABS
1000.0000 [IU] | ORAL_TABLET | Freq: Two times a day (BID) | ORAL | Status: DC
  Administered 2018-07-23 – 2018-07-24 (×4): 1000 [IU] via ORAL
  Filled 2018-07-23 (×4): qty 1

## 2018-07-23 NOTE — Progress Notes (Signed)
Initial Nutrition Assessment  RD working remotely.  DOCUMENTATION CODES:   Not applicable  INTERVENTION:   -30 ml Prostat daily, each supplement provides 100 kcals and 15 grams protein -Ensure Enlive po BID, each supplement provides 350 kcal and 20 grams of protein -MVI with minerals daily  NUTRITION DIAGNOSIS:   Inadequate oral intake related to dysphagia, decreased appetite as evidenced by meal completion < 25%, per patient/family report.  GOAL:   Patient will meet greater than or equal to 90% of their needs  MONITOR:   PO intake, Supplement acceptance, Weight trends, Labs, Skin, I & O's  REASON FOR ASSESSMENT:   Consult Assessment of nutrition requirement/status, Poor PO  ASSESSMENT:   [name] is a 85 y.o. male with history of CAD status post CABG, atrial fibrillation, chronic systolic heart failure, chronic anemia, hypothyroidism, recent episodes of aspiration with difficulty swallowing was brought to the ER the patient was feeling weak over the last 3 days.  Patient states he also was not feeling himself at times confused.  EMS was called and patient was given 250 cc normal saline bolus and was brought to the ER.  Patient denies any chest pain nausea vomiting diarrhea.  Has been having poor appetite.  And has not been eating well and has lost 6 pounds.  Pt admitted with generalized weakness likely due to dehydration. Lasix is currently on hold per cardiology.   5/12- s/p BSE- recommend regular diet with thin liquids to promote more food choices; previous MBSS on 2/20 reveals significant pharyngeal residue that accumulates and difficult to clear  Reviewed I/O's: +76 ml x 24 hours  UOP: 830 ml x 24 hours  Attempted to speak with pt on phone. RD introduced self and role on phone, however, pt reports he had a difficult time hearing RD ("I hear that you're speaking to me, but I can't understand what you're saying"). RD attempted to speak more clearly and provide more  concise questions and answers for pt, however, was unable to successfully obtain history from pt. Pt was also difficult to hear and had garbled speech at times (unsure if this was related to phone connectivity).   Reviewed meal completion records; noted meal completion 25%. Per H&P, pt reports not eating well and losing 6 pounds, however, unsure of time frame.   Reviewed wt hx, which reveals pt has only experienced a 2.6% wt loss over the past year. This is not consistent with pt report. Interpretation of wt reading difficult to due to presence of edema and use of lasix PTA.  Given ongoing dysphagia and decreased PO intake, agree with SLP assessment to provide pt with least restrictive diet possible. Pt would also benefit from addition of nutritional supplements.  Labs reviewed: Na: 124.   NUTRITION - FOCUSED PHYSICAL EXAM:    Most Recent Value  Orbital Region  Unable to assess  Upper Arm Region  Unable to assess  Thoracic and Lumbar Region  Unable to assess  Buccal Region  Unable to assess  Temple Region  Unable to assess  Clavicle Bone Region  Unable to assess  Clavicle and Acromion Bone Region  Unable to assess  Scapular Bone Region  Unable to assess  Dorsal Hand  Unable to assess  Patellar Region  Unable to assess  Anterior Thigh Region  Unable to assess  Posterior Calf Region  Unable to assess  Edema (RD Assessment)  Unable to assess  Hair  Unable to assess  Eyes  Unable to assess  Mouth  Unable to assess  Skin  Unable to assess  Nails  Unable to assess       Diet Order:   Diet Order            Diet Heart Room service appropriate? Yes; Fluid consistency: Thin; Fluid restriction: 1200 mL Fluid  Diet effective now              EDUCATION NEEDS:   No education needs have been identified at this time  Skin:  Skin Assessment: Skin Integrity Issues: Skin Integrity Issues:: Other (Comment) Other: partial thickness wound to medial buttocks  Last BM:   07/22/18  Height:   Ht Readings from Last 1 Encounters:  07/22/18 6\' 5"  (1.956 m)    Weight:   Wt Readings from Last 1 Encounters:  07/23/18 77.3 kg    Ideal Body Weight:  94.5 kg  BMI:  Body mass index is 20.21 kg/m.  Estimated Nutritional Needs:   Kcal:  1900-2100  Protein:  95-110 grams  Fluid:  1.2 L    Kennady Zimmerle A. Jimmye Norman, RD, LDN, [place] Registered Dietitian II Certified Diabetes Care and Education Specialist Pager: [phone] After hours Pager: [phone]

## 2018-07-23 NOTE — Discharge Instructions (Addendum)
Information on my medicine - Coumadin®   (Warfarin) ° °Why was Coumadin prescribed for you? °Coumadin was prescribed for you because you have a blood clot or a medical condition that can cause an increased risk of forming blood clots. Blood clots can cause serious health problems by blocking the flow of blood to the heart, lung, or brain. Coumadin can prevent harmful blood clots from forming. °As a reminder your indication for Coumadin is:   Stroke Prevention Because Of Atrial Fibrillation ° °What test will check on my response to Coumadin? °While on Coumadin (warfarin) you will need to have an INR test regularly to ensure that your dose is keeping you in the desired range. The INR (international normalized ratio) number is calculated from the result of the laboratory test called prothrombin time (PT). ° °If an INR APPOINTMENT HAS NOT ALREADY BEEN MADE FOR YOU please schedule an appointment to have this lab work done by your health care provider within 7 days. °Your INR goal is usually a number between:  2 to 3 or your provider may give you a more narrow range like 2-2.5.  Ask your health care provider during an office visit what your goal INR is. ° °What  do you need to  know  About  COUMADIN? °Take Coumadin (warfarin) exactly as prescribed by your healthcare provider about the same time each day.  DO NOT stop taking without talking to the doctor who prescribed the medication.  Stopping without other blood clot prevention medication to take the place of Coumadin may increase your risk of developing a new clot or stroke.  Get refills before you run out. ° °What do you do if you miss a dose? °If you miss a dose, take it as soon as you remember on the same day then continue your regularly scheduled regimen the next day.  Do not take two doses of Coumadin at the same time. ° °Important Safety Information °A possible side effect of Coumadin (Warfarin) is an increased risk of bleeding. You should call your healthcare  provider right away if you experience any of the following: °? Bleeding from an injury or your nose that does not stop. °? Unusual colored urine (red or dark brown) or unusual colored stools (red or black). °? Unusual bruising for unknown reasons. °? A serious fall or if you hit your head (even if there is no bleeding). ° °Some foods or medicines interact with Coumadin® (warfarin) and might alter your response to warfarin. To help avoid this: °? Eat a balanced diet, maintaining a consistent amount of Vitamin K. °? Notify your provider about major diet changes you plan to make. °? Avoid alcohol or limit your intake to 1 drink for women and 2 drinks for men per day. °(1 drink is 5 oz. wine, 12 oz. beer, or 1.5 oz. liquor.) ° °Make sure that ANY health care provider who prescribes medication for you knows that you are taking Coumadin (warfarin).  Also make sure the healthcare provider who is monitoring your Coumadin knows when you have started a new medication including herbals and non-prescription products. ° °Coumadin® (Warfarin)  Major Drug Interactions  °Increased Warfarin Effect Decreased Warfarin Effect  °Alcohol (large quantities) °Antibiotics (esp. Septra/Bactrim, Flagyl, Cipro) °Amiodarone (Cordarone) °Aspirin (ASA) °Cimetidine (Tagamet) °Megestrol (Megace) °NSAIDs (ibuprofen, naproxen, etc.) °Piroxicam (Feldene) °Propafenone (Rythmol SR) °Propranolol (Inderal) °Isoniazid (INH) °Posaconazole (Noxafil) Barbiturates (Phenobarbital) °Carbamazepine (Tegretol) °Chlordiazepoxide (Librium) °Cholestyramine (Questran) °Griseofulvin °Oral Contraceptives °Rifampin °Sucralfate (Carafate) °Vitamin K  ° °Coumadin® (Warfarin) Major Herbal   Interactions  Increased Warfarin Effect Decreased Warfarin Effect  Garlic Ginseng Ginkgo biloba Coenzyme Q10 Green tea St. Johns wort    Coumadin (Warfarin) FOOD Interactions  Eat a consistent number of servings per week of foods HIGH in Vitamin K (1 serving =  cup)  Collards  (cooked, or boiled & drained) Kale (cooked, or boiled & drained) Mustard greens (cooked, or boiled & drained) Parsley *serving size only =  cup Spinach (cooked, or boiled & drained) Swiss chard (cooked, or boiled & drained) Turnip greens (cooked, or boiled & drained)  Eat a consistent number of servings per week of foods MEDIUM-HIGH in Vitamin K (1 serving = 1 cup)  Asparagus (cooked, or boiled & drained) Broccoli (cooked, boiled & drained, or raw & chopped) Brussel sprouts (cooked, or boiled & drained) *serving size only =  cup Lettuce, raw (green leaf, endive, romaine) Spinach, raw Turnip greens, raw & chopped   These websites have more information on Coumadin (warfarin):  FailFactory.se; VeganReport.com.au;   Nutrition [facility] Proper nutrition can help your body recover from illness and injury.   Foods and beverages high in protein, vitamins, and minerals help rebuild muscle loss, promote healing, & reduce fall risk.   In addition to eating healthy foods, a nutrition shake is an easy, delicious way to get the nutrition you need during and after your hospital stay  It is recommended that you continue to drink 2 bottles per day of:       Ensure Plus for at least 1 month (30 days) after your hospital stay   Tips for adding a nutrition shake into your routine: As allowed, drink one with vitamins or medications instead of water or juice Enjoy one as a tasty mid-morning or afternoon snack Drink cold or make a milkshake out of it Drink one instead of milk with cereal or snacks Use as a coffee creamer   Available at the following grocery stores and pharmacies:           * [facility] [phone]            For COUPONS visit: www.ensure.com/join or http://dawson-may.com/    Suggested Substitutions Ensure Plus = Boost Plus = Carnation Breakfast Essentials = Boost Compact Ensure Active Clear = Boost Breeze Glucerna Shake = Boost Glucose Control = Carnation Breakfast Essentials SUGAR FREE

## 2018-07-23 NOTE — Evaluation (Signed)
Physical Therapy Evaluation Patient Details Name: [name] MRN: [id number] DOB: June 24, 1932 Today's Date: 07/23/2018   History of Present Illness  Pt is an 85 y.o. male admitted 07/22/18 with c/o weakness and confusion. EKG with afib rate controlled. Pt worked up for dehydration requiring IV fluids. Pt also with poor appetite, dysphagia with aspiration events. PMH includes CAD s/p CABG, afib, CHF, anemia, R knee OA, strokes, PE.    Clinical Impression  Pt presents with an overall decrease in functional mobility secondary to above. PTA, pt mod indep with RW; lives with wife who assists with IADLs as needed. Today, pt ambulatory with RW at supervision-level; limited by generalized weakness and decreased activity tolerance. Post-ambulation SpO2 100%, HR 57. Pt would benefit from continued acute PT services to maximize functional mobility and independence prior to d/c with HHPT.     Follow Up Recommendations Home health PT;Supervision for mobility/OOB    Equipment Recommendations  None recommended by PT    Recommendations for Other Services       Precautions / Restrictions Precautions Precautions: Fall Restrictions Weight Bearing Restrictions: No      Mobility  Bed Mobility Overal bed mobility: Modified Independent             General bed mobility comments: HOB slightly elevated  Transfers Overall transfer level: Needs assistance Equipment used: Rolling walker (2 wheeled) Transfers: Sit to/from Stand Sit to Stand: Supervision         General transfer comment: Able to stand from bed and recliner with RW, supervision for safety. Pt with good hand placement/technique without cues  Ambulation/Gait Ambulation/Gait assistance: Supervision Gait Distance (Feet): 120 Feet Assistive device: Rolling walker (2 wheeled) Gait Pattern/deviations: Step-through pattern;Decreased stride length;Trunk flexed Gait velocity: Decreased Gait velocity interpretation: <1.31 ft/sec,  indicative of household ambulator General Gait Details: Slow gait with RW and supervision for safety; increased time for turning. No overt instability or LOB. SpO2 100% on RA  Stairs            Wheelchair Mobility    Modified Rankin (Stroke Patients Only)       Balance Overall balance assessment: Needs assistance   Sitting balance-Leahy Scale: Good       Standing balance-Leahy Scale: Poor Standing balance comment: Reliant on UE support                             Pertinent Vitals/Pain Pain Assessment: Faces Faces Pain Scale: Hurts a little bit Pain Location: Back (chronic) Pain Descriptors / Indicators: Constant;Discomfort Pain Intervention(s): Limited activity within patient's tolerance    Home Living Family/patient expects to be discharged to:: Private residence Living Arrangements: Spouse/significant other Available Help at Discharge: Family;Available 24 hours/day Type of Home: House Home Access: Stairs to enter Entrance Stairs-Rails: Right Entrance Stairs-Number of Steps: 2 Home Layout: Two level;Able to live on main level with bedroom/bathroom Home Equipment: Gilford Rile - 2 wheels;Walker - 4 wheels;Bedside commode;Grab bars - tub/shower;Grab bars - toilet;Shower seat Additional Comments: Has lived on first floor of home for years    Prior Function Level of Independence: Needs assistance;Independent with assistive device(s)   Gait / Transfers Assistance Needed: Mod indep with rollator  ADL's / Homemaking Assistance Needed: Reports indep with ADLs; wife assists with IADLs        Hand Dominance        Extremity/Trunk Assessment   Upper Extremity Assessment Upper Extremity Assessment: Generalized weakness    Lower Extremity Assessment Lower  Extremity Assessment: Generalized weakness    Cervical / Trunk Assessment Cervical / Trunk Assessment: Kyphotic  Communication   Communication: HOH  Cognition Arousal/Alertness:  Awake/alert Behavior During Therapy: WFL for tasks assessed/performed Overall Cognitive Status: Within Functional Limits for tasks assessed                                        General Comments      Exercises     Assessment/Plan    PT Assessment Patient needs continued PT services  PT Problem List Decreased strength;Decreased activity tolerance;Decreased balance;Decreased mobility       PT Treatment Interventions DME instruction;Gait training;Stair training;Functional mobility training;Therapeutic activities;Therapeutic exercise;Balance training;Patient/family education    PT Goals (Current goals can be found in the Care Plan section)  Acute Rehab PT Goals Patient Stated Goal: Return home PT Goal Formulation: With patient Time For Goal Achievement: 08/06/18 Potential to Achieve Goals: Good    Frequency Min 3X/week   Barriers to discharge        Co-evaluation               AM-PAC PT "6 Clicks" Mobility  Outcome Measure Help needed turning from your back to your side while in a flat bed without using bedrails?: None Help needed moving from lying on your back to sitting on the side of a flat bed without using bedrails?: None Help needed moving to and from a bed to a chair (including a wheelchair)?: None Help needed standing up from a chair using your arms (e.g., wheelchair or bedside chair)?: A Little Help needed to walk in hospital room?: A Little Help needed climbing 3-5 steps with a railing? : A Little 6 Click Score: 21    End of Session Equipment Utilized During Treatment: Gait belt Activity Tolerance: Patient tolerated treatment well Patient left: in chair;with call bell/phone within reach Nurse Communication: Mobility status PT Visit Diagnosis: Other abnormalities of gait and mobility (R26.89);Muscle weakness (generalized) (M62.81)    Time: 7544-9201 PT Time Calculation (min) (ACUTE ONLY): 20 min   Charges:   PT Evaluation $PT Eval  Moderate Complexity: 1 Mod        Mabeline Caras, PT, DPT Acute Rehabilitation Services  Pager [phone] Office [place] 07/23/2018, 1:47 PM

## 2018-07-23 NOTE — Progress Notes (Signed)
ANTICOAGULATION CONSULT NOTE - Initial Consult  Pharmacy Consult for Warfarin  Indication: atrial fibrillation  Allergies  Allergen Reactions  . Penicillins Shortness Of Breath and Rash    Has patient had a PCN reaction causing immediate rash, facial/tongue/throat swelling, SOB or lightheadedness with hypotension: Yes Has patient had a PCN reaction causing severe rash involving mucus membranes or skin necrosis: No Has patient had a PCN reaction that required hospitalization No Has patient had a PCN reaction occurring within the last 10 years: No If all of the above answers are "NO", then may proceed with Cephalosporin use.   . Tizanidine Shortness Of Breath    Light headed  . Ace Inhibitors     Has not tolerated in the past due to hyperkalemia  . Antihistamines, Diphenhydramine-Type Other (See Comments)    Inhibits urination  . Clemastine Fumarate Other (See Comments)    Inhibits urination  . Dimenhydrinate Other (See Comments)    Inhibits urination  . Amiodarone Other (See Comments)    Unknown     Patient Measurements: Height: 6\' 5"  (195.6 cm) Weight: 164 lb 9.6 oz (74.7 kg) IBW/kg (Calculated) : 89.1  Vital Signs: Temp: 97.4 F (36.3 C) (05/12 0055) Temp Source: Oral (05/12 0055) BP: 121/72 (05/12 0055) Pulse Rate: 70 (05/12 0055)  Labs: Recent Labs    07/22/18 1711 07/22/18 1948  HGB 10.6*  --   HCT 33.1*  --   PLT 162  --   LABPROT  --  27.4*  INR  --  2.6*  CREATININE 0.84  --   TROPONINI <0.03  --     Estimated Creatinine Clearance: 69.2 mL/min (by C-G formula based on SCr of 0.84 mg/dL).   Medical History: Past Medical History:  Diagnosis Date  . Aortic stenosis, severe    WITH PERCUTANEOUS AORTIC VALVE (TAVI) IN March 2012 at the [facility]  . Aspiration into airway   . Cervical myelopathy ([place])   . Chronic anticoagulation    on coumadin  . Chronic systolic CHF (congestive heart failure) ([place])   . Coronary artery disease    a.  s/p  CABG 1979 and redo 1991.  Marland Kitchen Coughing   . Diverticulitis    CURRENTLY CONTROLLED WITH NO EVIDENCE OF RECURRENT INFECTION  . Esophageal stricture    WITH DILATATION  . Frailty   . GERD (gastroesophageal reflux disease)   . Heart murmur   . Hyperlipidemia   . Hyponatremia   . Ischemic cardiomyopathy 05/2010   Has EF of 25%  . MI (myocardial infarction) ([place]) 1974, 1978  . Osteoarthritis    RIGHT KNEE  . Osteoporosis   . Permanent atrial fibrillation    Has not tolerated amiodarone in the past. Amiodarone was stopped in September of 2010 due to side effects.  . Prostate cancer ([place])   . Pulmonary embolism ([place])   . Stroke [facility])    3 strokes  . VT (ventricular tachycardia) [facility])     Assessment: 85 y/o M on warfarin PTA for afib, presents to the ED with weakness/cough, continuing warfarin, INR is therapeutic at 2.6, Hgb 10.6, renal function good.   Warfarin PTA dosing: 7.5 mg on Fridays, 5 mg all other days  Goal of Therapy:  INR 2.5-3 (per outpatient anti-coag notes) Monitor platelets by anticoagulation protocol: Yes   Plan:  Cont warfarin per home regimen Daily PT/INR, adjust dose as needed  Monitor for bleeding  Narda Bonds, PharmD, BCPS Clinical Pharmacist Phone: [phone]

## 2018-07-23 NOTE — Progress Notes (Addendum)
Patient admitted after midnight, please see H&P.  Presented with weakness but feeling better with just IVF thus far.  PT consult.  Denies swallowing issues-- had eval in 04/2018.  Home in 24-48 hours. NA low but appears chronic-- trend. Eulogio Bear DO

## 2018-07-23 NOTE — Progress Notes (Signed)
Progress Note  Patient Name: [name] Date of Encounter: 07/23/2018  Primary Cardiologist: Sherren Mocha, MD    Subjective   Feels better with hydration. Acknowledges that he has lost weight due to difficulty swallowing food   Inpatient Medications    Scheduled Meds: . alfuzosin  10 mg Oral QHS  . aspirin  81 mg Oral Daily  . carvedilol  6.25 mg Oral BID WC  . cholecalciferol  1,000 Units Oral BID  . famotidine  40 mg Oral Daily  . fluticasone furoate-vilanterol  1 puff Inhalation Daily  . levothyroxine  125 mcg Oral QAC breakfast  . linaclotide  145 mcg Oral QAC breakfast  . mexiletine  150 mg Oral BID  . pantoprazole  40 mg Oral Daily  . warfarin  5 mg Oral Once per day on Sun Mon Tue Wed Thu Sat  . [START ON 07/26/2018] warfarin  7.5 mg Oral Q Fri-1800  . Warfarin - Pharmacist Dosing Inpatient   Does not apply q1800   Continuous Infusions: . sodium chloride 75 mL/hr at 07/23/18 0137    Vital Signs    Vitals:   07/23/18 0055 07/23/18 0228 07/23/18 0453 07/23/18 0835  BP: 121/72  116/75 116/69  Pulse: 70  73 86  Resp: 16  20 17   Temp: (!) 97.4 F (36.3 C)  (!) 97.4 F (36.3 C) 97.9 F (36.6 C)  TempSrc: Oral  Oral Oral  SpO2: 99%  100% 98%  Weight:  77.3 kg    Height:        Intake/Output Summary (Last 24 hours) at 07/23/2018 0915 Last data filed at 07/23/2018 8416 Gross per 24 hour  Intake 1026.35 ml  Output 1005 ml  Net 21.35 ml   Last 3 Weights 07/23/2018 07/22/2018 07/22/2018  Weight (lbs) 170 lb 6.4 oz 164 lb 9.6 oz 160 lb 15 oz  Weight (kg) 77.293 kg 74.662 kg 73 kg      Telemetry    Not on telemetry this am   ECG    afib IVCD  - Personally Reviewed  Physical Exam  Frail elderly dehydrated male  GEN: No acute distress.   Neck: No JVD Cardiac: RRR, SEM murmurs, rubs, or gallops.  Respiratory: Clear to auscultation bilaterally. GI: Soft, nontender, non-distended  MS: No edema; No deformity. Neuro:  Nonfocal  Psych: Normal affect   Labs    Chemistry Recent Labs  Lab 07/22/18 1711 07/23/18 0451  NA 123* 124*  K 4.4 4.3  CL 90* 93*  CO2 22 23  GLUCOSE 95 102*  BUN 11 8  CREATININE 0.84 0.78  CALCIUM 7.9* 8.2*  PROT 5.1*  --   ALBUMIN 3.0*  --   AST 12*  --   ALT 11  --   ALKPHOS 49  --   BILITOT 0.8  --   GFRNONAA >60 >60  GFRAA >60 >60  ANIONGAP 11 8     Hematology Recent Labs  Lab 07/22/18 1711 07/23/18 0451  WBC 6.6 7.2  RBC 3.51* 3.71*  HGB 10.6* 11.5*  HCT 33.1* 34.3*  MCV 94.3 92.5  MCH 30.2 31.0  MCHC 32.0 33.5  RDW 13.4 13.4  PLT 162 189    Cardiac Enzymes Recent Labs  Lab 07/22/18 1711  TROPONINI <0.03   No results for input(s): TROPIPOC in the last 168 hours.   BNP Recent Labs  Lab 07/22/18 1711  BNP 793.6*     DDimer No results for input(s): DDIMER in the last 168  hours.   Radiology    Ct Head Wo Contrast  Result Date: 07/23/2018 CLINICAL DATA:  85 year old male with a history of altered mental status EXAM: CT HEAD WITHOUT CONTRAST TECHNIQUE: Contiguous axial images were obtained from the base of the skull through the vertex without intravenous contrast. COMPARISON:  CT 02/07/2016 FINDINGS: Brain: No acute intracranial hemorrhage. No midline shift or mass effect. Gray-white differentiation maintained. Confluent hypodensity in the bilateral periventricular white matter. Focal hypodensity in the left centrum semiovale, unchanged. Mild senescent volume loss. Unremarkable appearance of the ventricular system. Vascular: Intracranial atherosclerosis. Skull: No acute fracture.  No aggressive bone lesion identified. Sinuses/Orbits: Unremarkable appearance of the orbits. Mastoid air cells clear. No middle ear effusion. No significant sinus disease. Other: None IMPRESSION: Negative for acute intracranial abnormality. Evidence of chronic microvascular ischemic disease. Associated intracranial atherosclerosis. Electronically Signed   By: Corrie Mckusick D.O.   On: 07/23/2018 08:30    Dg Chest Portable 1 View  Result Date: 07/22/2018 CLINICAL DATA:  Shortness of breath today. History of cardiomyopathy. EXAM: PORTABLE CHEST 1 VIEW COMPARISON:  Single-view of the chest 07/06/2018. PA and lateral chest 09/01/2015. CT chest 07/11/2015. FINDINGS: There is cardiomegaly. The patient is status post aortic valve replacement and has an AICD in place. Atherosclerosis is noted. No pneumothorax or pleural effusion. Pulmonary vascular congestion is noted. No consolidative process is seen. No acute bony abnormality. IMPRESSION: Cardiomegaly and pulmonary vascular congestion. Electronically Signed   By: Inge Rise M.D.   On: 07/22/2018 17:28    Cardiac Studies   TTE 12/19/17  EF 20-25% TAVR valve mean gradient 8 mmHg no PVL  Patient Profile     85 y.o. male admitted with chronic aspiration from previous stroke , weight loss, dehydration and weakness Cardiology asked to see for elevated BNP 724  Assessment & Plan    CHF:  EF 20-25% but he is very dry on exam. Ok to hydrate BNP chronically elevated and not indicative of PCWP in chronic afib. Hold lasix  VT:  AICD in place continue mexitil   Afib:  Rate control is fine on coreg / coumadin INR 2.6   CAD:  CABG in 79 and redo 91 no angina stable   AS:  Post TAVR no PVL and low mean gradient on echo 2019   Stroke/Aspiration:  Biggest issue he indicated he did not think he would want a feeding tube See swallow study results from 05/09/18       For questions or updates, please contact [place] Please consult [email or web address] for contact info under        Signed, Jenkins Rouge, MD  07/23/2018, 9:15 AM

## 2018-07-23 NOTE — Progress Notes (Signed)
ANTICOAGULATION CONSULT NOTE - Follow Up Consult  Pharmacy Consult for Coumadin Indication: atrial fibrillation  Allergies  Allergen Reactions  . Penicillins Shortness Of Breath and Rash    Has patient had a PCN reaction causing immediate rash, facial/tongue/throat swelling, SOB or lightheadedness with hypotension: Yes Has patient had a PCN reaction causing severe rash involving mucus membranes or skin necrosis: No Has patient had a PCN reaction that required hospitalization No Has patient had a PCN reaction occurring within the last 10 years: No If all of the above answers are "NO", then may proceed with Cephalosporin use.   . Tizanidine Shortness Of Breath    Light headed  . Ace Inhibitors     Has not tolerated in the past due to hyperkalemia  . Antihistamines, Diphenhydramine-Type Other (See Comments)    Inhibits urination  . Clemastine Fumarate Other (See Comments)    Inhibits urination  . Dimenhydrinate Other (See Comments)    Inhibits urination  . Amiodarone Other (See Comments)    Unknown     Patient Measurements: Height: 6\' 5"  (195.6 cm) Weight: 170 lb 6.4 oz (77.3 kg)(a scale) IBW/kg (Calculated) : 89.1  Vital Signs: Temp: 97.9 F (36.6 C) (05/12 0835) Temp Source: Oral (05/12 0835) BP: 116/69 (05/12 0835) Pulse Rate: 86 (05/12 0835)  Labs: Recent Labs    07/22/18 1711 07/22/18 1948 07/23/18 0451  HGB 10.6*  --  11.5*  HCT 33.1*  --  34.3*  PLT 162  --  189  LABPROT  --  27.4*  --   INR  --  2.6*  --   CREATININE 0.84  --  0.78  TROPONINI <0.03  --   --     Estimated Creatinine Clearance: 75.2 mL/min (by C-G formula based on SCr of 0.78 mg/dL).  Assessment:   Anticoag: Chronic afib on warfarin PTA. Hgb 11.5 with h/o anemia - PTA Coumadin 5mg  daily except 7.5mg  on Fridays, with admit INR 2.6  Goal of Therapy:  INR 2-3 Monitor platelets by anticoagulation protocol: Yes   Plan:  Cont warfarin per home regimen, 5mg  daily except 7.5mg  on  Fridays Daily PT/INR, adjust dose as needed  Monitor for bleeding  Westlynn Fifer S. Alford Highland, PharmD, BCPS Clinical Staff Pharmacist Eilene Ghazi [place] 07/23/2018,10:22 AM

## 2018-07-23 NOTE — H&P (Signed)
History and Physical    THELTON GRACA JME:[id number] DOB: 1932-10-06 DOA: 07/22/2018  PCP: [name], MD  Patient coming from: Home.  Chief Complaint: Weakness.  HPI: [name] is a 85 y.o. male with history of CAD status post CABG, atrial fibrillation, chronic systolic heart failure, chronic anemia, hypothyroidism, recent episodes of aspiration with difficulty swallowing was brought to the ER the patient was feeling weak over the last 3 days.  Patient states he also was not feeling himself at times confused.  EMS was called and patient was given 250 cc normal saline bolus and was brought to the ER.  Patient denies any chest pain nausea vomiting diarrhea.  Has been having poor appetite.  And has not been eating well and has lost 6 pounds.  ED Course: In the ER patient had EKG which showed A. fib rate controlled.  Patient sodium was 123 creatinine 1.8 AST 12 ALT 11 total bilirubin 0.8 hemoglobin 10.6 platelets 162 WBC 6.6 troponin less than 0.03 BNP 793 chest x-ray shows congestion and cardiomegaly.  Cardiology was consulted and at this time the recommended that patient is dehydrated and will need IV fluids to hold Lasix and will need further management for his poor appetite with dysphagia and aspiration events.  Review of Systems: As per HPI, rest all negative.   Past Medical History:  Diagnosis Date  . Aortic stenosis, severe    WITH PERCUTANEOUS AORTIC VALVE (TAVI) IN March 2012 at the [facility]  . Aspiration into airway   . Cervical myelopathy ([place])   . Chronic anticoagulation    on coumadin  . Chronic systolic CHF (congestive heart failure) ([place])   . Coronary artery disease    a.  s/p CABG 1979 and redo 1991.  Marland Kitchen Coughing   . Diverticulitis    CURRENTLY CONTROLLED WITH NO EVIDENCE OF RECURRENT INFECTION  . Esophageal stricture    WITH DILATATION  . Frailty   . GERD (gastroesophageal reflux disease)   . Heart murmur   . Hyperlipidemia   . Hyponatremia   .  Ischemic cardiomyopathy 05/2010   Has EF of 25%  . MI (myocardial infarction) ([place]) 1974, 1978  . Osteoarthritis    RIGHT KNEE  . Osteoporosis   . Permanent atrial fibrillation    Has not tolerated amiodarone in the past. Amiodarone was stopped in September of 2010 due to side effects.  . Prostate cancer ([place])   . Pulmonary embolism ([place])   . Stroke [facility])    3 strokes  . VT (ventricular tachycardia) ([place])     Past Surgical History:  Procedure Laterality Date  . AORTIC VALVE REPLACEMENT     Percutaneous AVR in March 2012 at the [facility]  . BACK SURGERY    . CARDIAC CATHETERIZATION  2010   SEVERE LV DYSFUNCTION WITH ESTIMATED EJECTION FRACTION OF 25%  . CARDIOVERSION  02/16/2011   Procedure: CARDIOVERSION;  Surgeon: Carlena Bjornstad, MD;  Location: [place];  Service: Cardiovascular;  Laterality: N/A;  . CHOLECYSTECTOMY    . CORONARY ARTERY BYPASS GRAFT  1979  . CORONARY ARTERY BYPASS GRAFT  1991   REDO SURGERY  . ICD  Feb 2003; 02/2013   gen change 02-11-2013 by Dr Caryl Comes  . IMPLANTABLE CARDIOVERTER DEFIBRILLATOR (ICD) GENERATOR CHANGE N/A 02/10/2013   Procedure: ICD GENERATOR CHANGE;  Surgeon: Deboraha Sprang, MD;  Location: [facility] CATH LAB;  Service: Cardiovascular;  Laterality: N/A;  . SHOULDER SURGERY       reports  that he quit smoking about 49 years ago. His smoking use included cigarettes. He has a 60.00 pack-year smoking history. He has never used smokeless tobacco. He reports that he does not drink alcohol or use drugs.  Allergies  Allergen Reactions  . Penicillins Shortness Of Breath and Rash    Has patient had a PCN reaction causing immediate rash, facial/tongue/throat swelling, SOB or lightheadedness with hypotension: Yes Has patient had a PCN reaction causing severe rash involving mucus membranes or skin necrosis: No Has patient had a PCN reaction that required hospitalization No Has patient had a PCN reaction occurring within the last 10 years: No If all of the above  answers are "NO", then may proceed with Cephalosporin use.   . Tizanidine Shortness Of Breath    Light headed  . Ace Inhibitors     Has not tolerated in the past due to hyperkalemia  . Antihistamines, Diphenhydramine-Type Other (See Comments)    Inhibits urination  . Clemastine Fumarate Other (See Comments)    Inhibits urination  . Dimenhydrinate Other (See Comments)    Inhibits urination  . Amiodarone Other (See Comments)    Unknown     Family History  Problem Relation Age of Onset  . Heart disease Mother 40  . Diabetes Mother 62  . Heart disease Father 59    Prior to Admission medications   Medication Sig Start Date End Date Taking? Authorizing Provider  albuterol (PROVENTIL HFA;VENTOLIN HFA) 108 (90 Base) MCG/ACT inhaler Inhale 1-2 puffs into the lungs every 6 (six) hours as needed for wheezing or shortness of breath.   Yes [provider]  alfuzosin (UROXATRAL) 10 MG 24 hr tablet Take 10 mg by mouth at bedtime.    Yes [provider]  aspirin 81 MG tablet Take 81 mg by mouth daily.   Yes [provider]  benzonatate (TESSALON) 100 MG capsule Take 100 mg by mouth 3 (three) times daily as needed for cough.   Yes [provider]  Budesonide-Formoterol Fumarate (SYMBICORT IN) Inhale 1 puff into the lungs 2 (two) times daily.   Yes [provider]  carvedilol (COREG) 6.25 MG tablet TAKE 1 TABLET BY MOUTH TWICE DAILY WITH A MEAL. Patient taking differently: Take 6.25 mg by mouth 2 (two) times daily with a meal.  07/01/18  Yes Sherren Mocha, MD  cholecalciferol (VITAMIN D) 1000 UNITS tablet Take 1,000 Units by mouth 2 (two) times daily.   Yes [provider]  Dextromethorphan Polistirex (DELSYM PO) Take 10 mLs by mouth every 12 (twelve) hours as needed (cough).    Yes [provider]  famotidine (PEPCID) 40 MG tablet Take 40 mg by mouth daily.   Yes [provider]  furosemide (LASIX) 20 MG tablet TAKE (1) TABLET  DAILY AS NEEDED FOR SWELLING. Patient taking differently: Take 20 mg by mouth daily as needed (swelling).  06/19/16  Yes Sherren Mocha, MD  ipratropium (ATROVENT) 0.03 % nasal spray Place 2 sprays into both nostrils 2 (two) times daily.   Yes [provider]  levothyroxine (SYNTHROID) 125 MCG tablet Take 1 tablet (125 mcg total) by mouth daily before breakfast. 09/02/15  Yes Hosie Poisson, MD  lidocaine (LIDODERM) 5 % Place 1 patch onto the skin daily as needed (pain). Remove & Discard patch within 12 hours or as directed by MD   Yes [provider]  linaclotide (LINZESS) 145 MCG CAPS capsule Take 145 mcg by mouth daily before breakfast.   Yes [provider]  mexiletine (MEXITIL) 150 MG capsule Take 1 capsule (150 mg total) by mouth 2 (two) times a day. 07/07/18  Yes Cheryln Manly, NP  nitroGLYCERIN (NITROSTAT) 0.4 MG SL tablet Place 1 tablet (0.4 mg total) under the tongue every 5 (five) minutes x 3 doses as needed for chest pain (Max 3 doses in 15 min. Call 911). 12/06/16  Yes Sherren Mocha, MD  omeprazole (PRILOSEC) 20 MG capsule Take 20 mg by mouth daily as needed (acid reflux).   Yes [provider]  polyethylene glycol (MIRALAX / GLYCOLAX) packet Take 17 g by mouth daily as needed for mild constipation.    Yes [provider]  Probiotic Product (PROBIOTIC DAILY PO) Take 1 capsule by mouth daily.   Yes [provider]  RESTASIS 0.05 % ophthalmic emulsion Place 1 drop into both eyes 2 (two) times daily as needed (dry eyes).  12/05/11  Yes [provider]  traMADol (ULTRAM) 50 MG tablet Take 50 mg by mouth every 6 (six) hours as needed for moderate pain.   Yes [provider]  warfarin (COUMADIN) 5 MG tablet Take 1-1.5 tablets (5-7.5 mg total) by mouth See admin instructions. Take 7.5 mg by mouth only on Fridays; all other days take 5mg  07/07/18  Yes Cheryln Manly, NP    Physical Exam: Vitals:   07/22/18 2015  07/22/18 2115 07/22/18 2208 07/23/18 0055  BP:   97/66 121/72  Pulse: 64 62 78 70  Resp: (!) 22 (!) 22 (!) 24 16  Temp:   97.6 F (36.4 C) (!) 97.4 F (36.3 C)  TempSrc:   Oral Oral  SpO2: 99% 97% 100% 99%  Weight:   74.7 kg   Height:   6\' 5"  (1.956 m)       Constitutional: Moderately built and nourished. Vitals:   07/22/18 2015 07/22/18 2115 07/22/18 2208 07/23/18 0055  BP:   97/66 121/72  Pulse: 64 62 78 70  Resp: (!) 22 (!) 22 (!) 24 16  Temp:   97.6 F (36.4 C) (!) 97.4 F (36.3 C)  TempSrc:   Oral Oral  SpO2: 99% 97% 100% 99%  Weight:   74.7 kg   Height:   6\' 5"  (1.956 m)    Eyes: Anicteric no pallor. ENMT: No discharge from the ears eyes nose or mouth. Neck: No mass felt.  No neck rigidity. Respiratory: No rhonchi or crepitations. Cardiovascular: S1-S2 heard. Abdomen: Soft nontender bowel sounds present. Musculoskeletal: No edema.  No joint effusion. Skin: No rash. Neurologic: Alert awake oriented to his name and place moves all extremities 5 x 5.  No facial asymmetry tongue is midline. Psychiatric: Appears normal per normal affect.   Labs on Admission: I have personally reviewed following labs and imaging studies  CBC: Recent Labs  Lab 07/22/18 1711  WBC 6.6  NEUTROABS 4.9  HGB 10.6*  HCT 33.1*  MCV 94.3  PLT 549   Basic Metabolic Panel: Recent Labs  Lab 07/22/18 1711  NA 123*  K 4.4  CL 90*  CO2 22  GLUCOSE 95  BUN 11  CREATININE 0.84  CALCIUM 7.9*   GFR: Estimated Creatinine Clearance: 69.2 mL/min (by C-G formula based on SCr of 0.84 mg/dL). Liver Function Tests: Recent Labs  Lab 07/22/18 1711  AST 12*  ALT 11  ALKPHOS 49  BILITOT 0.8  PROT 5.1*  ALBUMIN 3.0*   No results for input(s): LIPASE, AMYLASE in the last 168 hours. No results for input(s): AMMONIA in the last  168 hours. Coagulation Profile: Recent Labs  Lab 07/19/18 07/22/18 1948  INR 2.0 2.6*   Cardiac Enzymes: Recent Labs  Lab 07/22/18 1711  TROPONINI  <0.03   BNP (last 3 results) No results for input(s): PROBNP in the last 8760 hours. HbA1C: No results for input(s): HGBA1C in the last 72 hours. CBG: No results for input(s): GLUCAP in the last 168 hours. Lipid Profile: No results for input(s): CHOL, HDL, LDLCALC, TRIG, CHOLHDL, LDLDIRECT in the last 72 hours. Thyroid Function Tests: No results for input(s): TSH, T4TOTAL, FREET4, T3FREE, THYROIDAB in the last 72 hours. Anemia Panel: No results for input(s): VITAMINB12, FOLATE, FERRITIN, TIBC, IRON, RETICCTPCT in the last 72 hours. Urine analysis:    Component Value Date/Time   COLORURINE YELLOW 07/06/2018 [place] 07/06/2018 1235   LABSPEC 1.012 07/06/2018 1235   LABSPEC 1.010 11/16/2006 1032   PHURINE 7.0 07/06/2018 1235   GLUCOSEU NEGATIVE 07/06/2018 1235   HGBUR NEGATIVE 07/06/2018 1235   BILIRUBINUR NEGATIVE 07/06/2018 1235   BILIRUBINUR Negative 11/16/2006 1032   KETONESUR 20 (A) 07/06/2018 1235   PROTEINUR NEGATIVE 07/06/2018 1235   UROBILINOGEN 0.2 11/25/2014 0510   NITRITE NEGATIVE 07/06/2018 1235   LEUKOCYTESUR NEGATIVE 07/06/2018 1235   LEUKOCYTESUR Trace 11/16/2006 1032   Sepsis Labs: @LABRCNTIP (procalcitonin:4,lacticidven:4) ) Recent Results (from the past 240 hour(s))  SARS Coronavirus 2 (CEPHEID - Performed in [place] hospital lab), Hosp Order     Status: None   Collection Time: 07/22/18  5:25 PM  Result Value Ref Range Status   SARS Coronavirus 2 NEGATIVE NEGATIVE Final    Comment: (NOTE) If result is NEGATIVE SARS-CoV-2 target nucleic acids are NOT DETECTED. The SARS-CoV-2 RNA is generally detectable in upper and lower  respiratory specimens during the acute phase of infection. The lowest  concentration of SARS-CoV-2 viral copies this assay can detect is 250  copies / mL. A negative result does not preclude SARS-CoV-2 infection  and should not be used as the sole basis for treatment or other  patient management decisions.  A  negative result may occur with  improper specimen collection / handling, submission of specimen other  than nasopharyngeal swab, presence of viral mutation(s) within the  areas targeted by this assay, and inadequate number of viral copies  (<250 copies / mL). A negative result must be combined with clinical  observations, patient history, and epidemiological information. If result is POSITIVE SARS-CoV-2 target nucleic acids are DETECTED. The SARS-CoV-2 RNA is generally detectable in upper and lower  respiratory specimens dur ing the acute phase of infection.  Positive  results are indicative of active infection with SARS-CoV-2.  Clinical  correlation with patient history and other diagnostic information is  necessary to determine patient infection status.  Positive results do  not rule out bacterial infection or co-infection with other viruses. If result is PRESUMPTIVE POSTIVE SARS-CoV-2 nucleic acids MAY BE PRESENT.   A presumptive positive result was obtained on the submitted specimen  and confirmed on repeat testing.  While 2019 novel coronavirus  (SARS-CoV-2) nucleic acids may be present in the submitted sample  additional confirmatory testing may be necessary for epidemiological  and / or clinical management purposes  to differentiate between  SARS-CoV-2 and other Sarbecovirus currently known to infect humans.  If clinically indicated additional testing with an alternate test  methodology [phone]) is advised. The SARS-CoV-2 RNA is generally  detectable in upper and lower respiratory sp ecimens during the acute  phase of infection. The expected result  is Negative. Fact Sheet for Patients:  StrictlyIdeas.no Fact Sheet for Healthcare Providers: BankingDealers.co.za This test is not yet approved or cleared by the [place] FDA and has been authorized for detection and/or diagnosis of SARS-CoV-2 by FDA under an Emergency Use  Authorization (EUA).  This EUA will remain in effect (meaning this test can be used) for the duration of the COVID-19 declaration under Section 564(b)(1) of the Act, 21 U.S.C. section 360bbb-3(b)(1), unless the authorization is terminated or revoked sooner. Performed at [facility], [place] [address], [place], [place] [address]      Radiological Exams on Admission: Dg Chest Portable 1 View  Result Date: 07/22/2018 CLINICAL DATA:  Shortness of breath today. History of cardiomyopathy. EXAM: PORTABLE CHEST 1 VIEW COMPARISON:  Single-view of the chest 07/06/2018. PA and lateral chest 09/01/2015. CT chest 07/11/2015. FINDINGS: There is cardiomegaly. The patient is status post aortic valve replacement and has an AICD in place. Atherosclerosis is noted. No pneumothorax or pleural effusion. Pulmonary vascular congestion is noted. No consolidative process is seen. No acute bony abnormality. IMPRESSION: Cardiomegaly and pulmonary vascular congestion. Electronically Signed   By: Inge Rise M.D.   On: 07/22/2018 17:28    EKG: Independently reviewed.  A. fib rate controlled.  Assessment/Plan Principal Problem:   Dehydration Active Problems:   Ischemic cardiomyopathy   Pulmonary embolism (HCC)   S/P aortic valve replacement   Chronic cough   Automatic implantable cardioverter-defibrillator in situ   Weakness   Hyponatremia   Exertional dyspnea    1. Generalized weakness likely from dehydration and poor appetite -appreciate cardiology consult Lasix on hold gently hydrating.  Will get a CT head. 2. Aspiration events will get swallow evaluation. 3. Chronic hyponatremia follow metabolic panel check urine sodium check TSH. 4. CAD denies any chest pain. 5. CHF presently on IV fluids.  Holding Lasix. 6. History of A. fib on mexiletine and Coumadin. 7. Chronic anemia mild worsening hemoglobin follow CBC.   DVT prophylaxis: Coumadin. Code Status: Full code. Family Communication: No  family at the bedside. Disposition Plan: Home. Consults called: Cardiology. Admission status: Observation.   Rise Patience MD [facility] Pager [phone].  If 7PM-7AM, please contact night-coverage [email or web address] Password [facility]  07/23/2018, 12:56 AM

## 2018-07-23 NOTE — Evaluation (Signed)
Clinical/Bedside Swallow Evaluation Patient Details  Name: [name] MRN: [id number] Date of Birth: [birth year]  Today's Date: 07/23/2018 Time: SLP Start Time (ACUTE ONLY): 1035 SLP Stop Time (ACUTE ONLY): 1055 SLP Time Calculation (min) (ACUTE ONLY): 20 min  Past Medical History:  Past Medical History:  Diagnosis Date  . Aortic stenosis, severe    WITH PERCUTANEOUS AORTIC VALVE (TAVI) IN March 2012 at the [facility]  . Aspiration into airway   . Cervical myelopathy ([place])   . Chronic anticoagulation    on coumadin  . Chronic systolic CHF (congestive heart failure) ([place])   . Coronary artery disease    a.  s/p CABG 1979 and redo 1991.  Marland Kitchen Coughing   . Diverticulitis    CURRENTLY CONTROLLED WITH NO EVIDENCE OF RECURRENT INFECTION  . Esophageal stricture    WITH DILATATION  . Frailty   . GERD (gastroesophageal reflux disease)   . Heart murmur   . Hyperlipidemia   . Hyponatremia   . Ischemic cardiomyopathy 05/2010   Has EF of 25%  . MI (myocardial infarction) ([place]) 1974, 1978  . Osteoarthritis    RIGHT KNEE  . Osteoporosis   . Permanent atrial fibrillation    Has not tolerated amiodarone in the past. Amiodarone was stopped in September of 2010 due to side effects.  . Prostate cancer ([place])   . Pulmonary embolism ([place])   . Stroke [facility])    3 strokes  . VT (ventricular tachycardia) ([place])    Past Surgical History:  Past Surgical History:  Procedure Laterality Date  . AORTIC VALVE REPLACEMENT     Percutaneous AVR in March 2012 at the [facility]  . BACK SURGERY    . CARDIAC CATHETERIZATION  2010   SEVERE LV DYSFUNCTION WITH ESTIMATED EJECTION FRACTION OF 25%  . CARDIOVERSION  02/16/2011   Procedure: CARDIOVERSION;  Surgeon: Carlena Bjornstad, MD;  Location: [place];  Service: Cardiovascular;  Laterality: N/A;  . CHOLECYSTECTOMY    . CORONARY ARTERY BYPASS GRAFT  1979  . CORONARY ARTERY BYPASS GRAFT  1991   REDO SURGERY  . ICD  Feb 2003; 02/2013   gen change  02-11-2013 by Dr Caryl Comes  . IMPLANTABLE CARDIOVERTER DEFIBRILLATOR (ICD) GENERATOR CHANGE N/A 02/10/2013   Procedure: ICD GENERATOR CHANGE;  Surgeon: Deboraha Sprang, MD;  Location: [facility] CATH LAB;  Service: Cardiovascular;  Laterality: N/A;  . SHOULDER SURGERY     HPI:  85 y.o. male admitted with hx of CAD s/p CABG, afib, strokes, recent episodes of aspiration and frequent coughing when eating, weight loss, dehydration and weakness.  MBS in 2014; MBS 05/09/18: mild dysphagia with aspiration of thin liquids only when consumed in conjunction with 13 mm barium pill; pharyngeal residue. Otherwise, no aspiration.  Hx of esophageal stricture with dilatation, GERD.    Assessment / Plan / Recommendation Clinical Impression  Pt presents with a chronic dysphagia.  Review of MBS from Feb of 2020 reveals significant pharyngeal residue (in valleculae and pyriform sinuses) that accumulates and is difficult to clear.  Presence of residue appears to be related to poor pharyngeal contraction and presence of osteophytes that may narrow entrance of esophagus.  Pt did not demonstrate overt s/s of aspiration during clinical assessment today, but he did present with effortful swallow and multiple sub-swallows per bolus, likely in an effort to clear the residue.    Recommend continuing current diet to allow more food choices; consider referral to dietician to discuss nutrition and calorie/protein intake; SLP will  follow for therapeutic exercises to work toward improving strength/mobility of swallowing musculature.  Pt agrees with plan.   SLP Visit Diagnosis: Dysphagia, oropharyngeal phase (R13.12)    Aspiration Risk  Mild aspiration risk    Diet Recommendation     Medication Administration: Crushed with puree    Other  Recommendations Oral Care Recommendations: Oral care BID   Follow up Recommendations (tba)      Frequency and Duration min 2x/week  2 weeks       Prognosis        Swallow Study   General HPI:  85 y.o. male admitted with hx of CAD s/p CABG, afib, strokes, recent episodes of aspiration and frequent coughing when eating, weight loss, dehydration and weakness.  MBS in 2014; MBS 05/09/18: mild dysphagia with aspiration of thin liquids only when consumed in conjunction with 13 mm barium pill; pharyngeal residue. Otherwise, no aspiration.  Hx of esophageal stricture with dilatation, GERD.  Type of Study: Bedside Swallow Evaluation Previous Swallow Assessment: see HPI Diet Prior to this Study: Regular;Thin liquids Temperature Spikes Noted: No Respiratory Status: Room air History of Recent Intubation: No Behavior/Cognition: Alert;Cooperative;Pleasant mood Oral Cavity Assessment: Within Functional Limits Oral Care Completed by SLP: No Oral Cavity - Dentition: Adequate natural dentition Vision: Functional for self-feeding Self-Feeding Abilities: Able to feed self Patient Positioning: Upright in bed Baseline Vocal Quality: Normal Volitional Cough: Strong Volitional Swallow: Able to elicit    Oral/Motor/Sensory Function Overall Oral Motor/Sensory Function: Within functional limits   Ice Chips Ice chips: Within functional limits   Thin Liquid Thin Liquid: Within functional limits    Nectar Thick Nectar Thick Liquid: Not tested   Honey Thick Honey Thick Liquid: Not tested   Puree Puree: Impaired Presentation: Self Fed Pharyngeal Phase Impairments: Multiple swallows   Solid     Solid: Impaired Pharyngeal Phase Impairments: Multiple swallows      Tivis Ringer, [place] 07/23/2018,2:16 PM   [place]. Tivis Ringer, [place] Office number [phone] Pager [phone]

## 2018-07-24 DIAGNOSIS — R1310 Dysphagia, unspecified: Secondary | ICD-10-CM

## 2018-07-24 DIAGNOSIS — E871 Hypo-osmolality and hyponatremia: Secondary | ICD-10-CM

## 2018-07-24 DIAGNOSIS — R531 Weakness: Secondary | ICD-10-CM

## 2018-07-24 LAB — CBC
HCT: 34.2 % — ABNORMAL LOW (ref 39.0–52.0)
Hemoglobin: 11.4 g/dL — ABNORMAL LOW (ref 13.0–17.0)
MCH: 30.8 pg (ref 26.0–34.0)
MCHC: 33.3 g/dL (ref 30.0–36.0)
MCV: 92.4 fL (ref 80.0–100.0)
Platelets: 175 10*3/uL (ref 150–400)
RBC: 3.7 MIL/uL — ABNORMAL LOW (ref 4.22–5.81)
RDW: 13.3 % (ref 11.5–15.5)
WBC: 6.8 10*3/uL (ref 4.0–10.5)
nRBC: 0 % (ref 0.0–0.2)

## 2018-07-24 LAB — BASIC METABOLIC PANEL
Anion gap: 8 (ref 5–15)
BUN: 12 mg/dL (ref 8–23)
CO2: 25 mmol/L (ref 22–32)
Calcium: 8.3 mg/dL — ABNORMAL LOW (ref 8.9–10.3)
Chloride: 93 mmol/L — ABNORMAL LOW (ref 98–111)
Creatinine, Ser: 0.93 mg/dL (ref 0.61–1.24)
GFR calc Af Amer: >60 mL/min (ref >60)
GFR calc non Af Amer: >60 mL/min (ref >60)
Glucose, Bld: 94 mg/dL (ref 70–99)
Potassium: 4.6 mmol/L (ref 3.5–5.1)
Sodium: 126 mmol/L — ABNORMAL LOW (ref 135–145)

## 2018-07-24 LAB — PROTIME-INR
INR: 2.6 — ABNORMAL HIGH (ref 0.8–1.2)
Prothrombin Time: 27 seconds — ABNORMAL HIGH (ref 11.4–15.2)

## 2018-07-24 MED ORDER — ENSURE ENLIVE PO LIQD: 237.0000 mL | Freq: Two times a day (BID) | ORAL | 2 refills | Status: AC

## 2018-07-24 NOTE — Progress Notes (Signed)
Progress Note  Patient Name: [name] Date of Encounter: 07/24/2018  Primary Cardiologist: Sherren Mocha, MD    Subjective   Feels better with hydration. Dysphagia from previous stroke persists   Inpatient Medications    Scheduled Meds:  alfuzosin  10 mg Oral QHS   aspirin  81 mg Oral Daily   carvedilol  6.25 mg Oral BID WC   cholecalciferol  1,000 Units Oral BID   famotidine  40 mg Oral Daily   feeding supplement (ENSURE ENLIVE)  237 mL Oral BID BM   feeding supplement (PRO-STAT SUGAR FREE 64)  30 mL Oral Daily   fluticasone furoate-vilanterol  1 puff Inhalation Daily   levothyroxine  125 mcg Oral QAC breakfast   linaclotide  145 mcg Oral QAC breakfast   mexiletine  150 mg Oral BID   multivitamin with minerals  1 tablet Oral Daily   pantoprazole  40 mg Oral Daily   warfarin  5 mg Oral Once per day on Sun Mon Tue Wed Thu Sat   [START ON 07/26/2018] warfarin  7.5 mg Oral Q Fri-1800   Warfarin - Pharmacist Dosing Inpatient   Does not apply q1800   Continuous Infusions:   Vital Signs    Vitals:   07/23/18 1659 07/23/18 2027 07/24/18 0546 07/24/18 0831  BP: 101/73 (!) 99/59 110/67   Pulse: 75 72 82   Resp: 16 18 18    Temp: (!) 97.4 F (36.3 C) 97.9 F (36.6 C) (!) 97.5 F (36.4 C)   TempSrc: Oral Oral Oral   SpO2: 100% 100% 99% 98%  Weight:      Height:        Intake/Output Summary (Last 24 hours) at 07/24/2018 0851 Last data filed at 07/24/2018 7989 Gross per 24 hour  Intake 780 ml  Output 1600 ml  Net -820 ml   Last 3 Weights 07/23/2018 07/22/2018 07/22/2018  Weight (lbs) 170 lb 6.4 oz 164 lb 9.6 oz 160 lb 15 oz  Weight (kg) 77.293 kg 74.662 kg 73 kg      Telemetry    Not on telemetry this am   ECG    afib IVCD  - Personally Reviewed  Physical Exam  Frail elderly dehydrated male  GEN: No acute distress.   Neck: No JVD Cardiac: RRR, SEM murmurs, rubs, or gallops.  Respiratory: Clear to auscultation bilaterally. GI: Soft,  nontender, non-distended  MS: No edema; No deformity. Neuro:  Nonfocal  Psych: Normal affect   Labs    Chemistry Recent Labs  Lab 07/22/18 1711 07/23/18 0451 07/24/18 0456  NA 123* 124* 126*  K 4.4 4.3 4.6  CL 90* 93* 93*  CO2 22 23 25   GLUCOSE 95 102* 94  BUN 11 8 12   CREATININE 0.84 0.78 0.93  CALCIUM 7.9* 8.2* 8.3*  PROT 5.1*  --   --   ALBUMIN 3.0*  --   --   AST 12*  --   --   ALT 11  --   --   ALKPHOS 49  --   --   BILITOT 0.8  --   --   GFRNONAA >60 >60 >60  GFRAA >60 >60 >60  ANIONGAP 11 8 8      Hematology Recent Labs  Lab 07/22/18 1711 07/23/18 0451 07/24/18 0456  WBC 6.6 7.2 6.8  RBC 3.51* 3.71* 3.70*  HGB 10.6* 11.5* 11.4*  HCT 33.1* 34.3* 34.2*  MCV 94.3 92.5 92.4  MCH 30.2 31.0 30.8  MCHC 32.0 33.5  33.3  RDW 13.4 13.4 13.3  PLT 162 189 175    Cardiac Enzymes Recent Labs  Lab 07/22/18 1711  TROPONINI <0.03   No results for input(s): TROPIPOC in the last 168 hours.   BNP Recent Labs  Lab 07/22/18 1711  BNP 793.6*     DDimer No results for input(s): DDIMER in the last 168 hours.   Radiology    Ct Head Wo Contrast  Result Date: 07/23/2018 CLINICAL DATA:  85 year old male with a history of altered mental status EXAM: CT HEAD WITHOUT CONTRAST TECHNIQUE: Contiguous axial images were obtained from the base of the skull through the vertex without intravenous contrast. COMPARISON:  CT 02/07/2016 FINDINGS: Brain: No acute intracranial hemorrhage. No midline shift or mass effect. Gray-white differentiation maintained. Confluent hypodensity in the bilateral periventricular white matter. Focal hypodensity in the left centrum semiovale, unchanged. Mild senescent volume loss. Unremarkable appearance of the ventricular system. Vascular: Intracranial atherosclerosis. Skull: No acute fracture.  No aggressive bone lesion identified. Sinuses/Orbits: Unremarkable appearance of the orbits. Mastoid air cells clear. No middle ear effusion. No significant sinus  disease. Other: None IMPRESSION: Negative for acute intracranial abnormality. Evidence of chronic microvascular ischemic disease. Associated intracranial atherosclerosis. Electronically Signed   By: Corrie Mckusick D.O.   On: 07/23/2018 08:30   Dg Chest Portable 1 View  Result Date: 07/22/2018 CLINICAL DATA:  Shortness of breath today. History of cardiomyopathy. EXAM: PORTABLE CHEST 1 VIEW COMPARISON:  Single-view of the chest 07/06/2018. PA and lateral chest 09/01/2015. CT chest 07/11/2015. FINDINGS: There is cardiomegaly. The patient is status post aortic valve replacement and has an AICD in place. Atherosclerosis is noted. No pneumothorax or pleural effusion. Pulmonary vascular congestion is noted. No consolidative process is seen. No acute bony abnormality. IMPRESSION: Cardiomegaly and pulmonary vascular congestion. Electronically Signed   By: Inge Rise M.D.   On: 07/22/2018 17:28    Cardiac Studies   TTE 12/19/17  EF 20-25% TAVR valve mean gradient 8 mmHg no PVL  Patient Profile     85 y.o. male admitted with chronic aspiration from previous stroke , weight loss, dehydration and weakness Cardiology asked to see for elevated BNP 724  Assessment & Plan    CHF:  EF 20-25% but he is very dry on exam. Ok to hydrate BNP chronically elevated and not indicative of PCWP in chronic afib. Lasix held but I/O still negative due to poor PO intake   VT:  AICD in place continue mexitil   Afib:  Rate control is fine on coreg / coumadin INR 2.6 today   CAD:  CABG in 79 and redo 91 no angina stable   AS:  Post TAVR no PVL and low mean gradient on echo 2019   Stroke/Aspiration:  Biggest issue he indicated he did not think he would want a feeding tube See swallow study results from 05/09/18       For questions or updates, please contact [place] Please consult [email or web address] for contact info under        Signed, Jenkins Rouge, MD  07/24/2018, 8:51 AM

## 2018-07-24 NOTE — Progress Notes (Signed)
Discharge instructions (including medications) discussed with and copy provided to patient/caregiver. All belongings sent with patient. 

## 2018-07-24 NOTE — Discharge Summary (Signed)
Physician Discharge Summary  [name] DPO:[id number] DOB: 09-07-1932 DOA: 07/22/2018  PCP: [name], MD  Admit date: 07/22/2018 Discharge date: 07/24/2018  Admitted From: Home Disposition: Home  Recommendations for Outpatient Follow-up:  1. Follow up with PCP in 1-2 weeks 2. Please obtain CBC/BMP/Mag at follow up 3. Please follow up on the following pending results: None  Home Health: PT/OT/RN Equipment/Devices: Walker  Discharge Condition: Stable CODE STATUS: Full code  Hospital Course: 85 y.o. male with history of CAD status post CABG, atrial fibrillation, chronic systolic heart failure, chronic anemia, hypothyroidism, recent episodes of aspiration with dysphagia brought to ED due to generalized weakness and intermittent confusion.   In ED, hemodynamically stable. CBC and CMP not impressive except for sodium of 123 (baseline 127-130) and creatinine to 1.8.  BNP 793 (about baseline).  CXR showed pulmonary congestion and cardiomegaly.  Cardiology consulted and felt patient is dehydrated and recommended IV fluid and holding home Lasix.  COVID-19 negative.  Patient was hydrated with IV fluid with significant improvement in his symptoms.  He was also evaluated by SLP and found to have mild aspiration risk.  Patient was evaluated by PT and OT who recommended SNF placement.  However, patient and family declined SNF discharge home with home health.  See individual problem list below for more.  Discharge Diagnoses:  Generalized weakness: multifactorial including dehydration in the setting of poor p.o. intake and dysphagia and hyponatremia.  CT head without acute finding.  Patient was hydrated with IV fluids with improvement in his weakness and hyponatremia which is almost back to baseline.  Evaluated by nutritionist and discharged home on supplements.  Hyponatremia: sodium is back to baseline.  Likely due to dehydration. -Recheck BMP at follow-up  Other medical conditions  including CAD/CHF/A. fib/chronic anemia is stable.  Discharge Instructions  Discharge Instructions    (HEART FAILURE PATIENTS) Call MD:  Anytime you have any of the following symptoms: 1) 3 pound weight gain in 24 hours or 5 pounds in 1 week 2) shortness of breath, with or without a dry hacking cough 3) swelling in the hands, feet or stomach 4) if you have to sleep on extra pillows at night in order to breathe.   Complete by:  As directed    Call MD for:  difficulty breathing, headache or visual disturbances   Complete by:  As directed    Call MD for:  extreme fatigue   Complete by:  As directed    Call MD for:  persistant dizziness or light-headedness   Complete by:  As directed    Call MD for:  persistant nausea and vomiting   Complete by:  As directed    Call MD for:  severe uncontrolled pain   Complete by:  As directed    Call MD for:  temperature >100.4   Complete by:  As directed    Diet - low sodium heart healthy   Complete by:  As directed    Increase activity slowly   Complete by:  As directed      Allergies as of 07/24/2018      Reactions   Penicillins Shortness Of Breath, Rash   Has patient had a PCN reaction causing immediate rash, facial/tongue/throat swelling, SOB or lightheadedness with hypotension: Yes Has patient had a PCN reaction causing severe rash involving mucus membranes or skin necrosis: No Has patient had a PCN reaction that required hospitalization No Has patient had a PCN reaction occurring within the last 10 years: No  If all of the above answers are "NO", then may proceed with Cephalosporin use.   Tizanidine Shortness Of Breath   Light headed   Ace Inhibitors    Has not tolerated in the past due to hyperkalemia   Antihistamines, Diphenhydramine-type Other (See Comments)   Inhibits urination   Clemastine Fumarate Other (See Comments)   Inhibits urination   Dimenhydrinate Other (See Comments)   Inhibits urination   Amiodarone Other (See Comments)    Unknown       Medication List    STOP taking these medications   DELSYM PO     TAKE these medications   albuterol 108 (90 Base) MCG/ACT inhaler Commonly known as:  VENTOLIN HFA Inhale 1-2 puffs into the lungs every 6 (six) hours as needed for wheezing or shortness of breath.   alfuzosin 10 MG 24 hr tablet Commonly known as:  UROXATRAL Take 10 mg by mouth at bedtime.   aspirin 81 MG tablet Take 81 mg by mouth daily.   benzonatate 100 MG capsule Commonly known as:  TESSALON Take 100 mg by mouth 3 (three) times daily as needed for cough.   carvedilol 6.25 MG tablet Commonly known as:  COREG TAKE 1 TABLET BY MOUTH TWICE DAILY WITH A MEAL. What changed:  See the new instructions.   cholecalciferol 1000 units tablet Commonly known as:  VITAMIN D Take 1,000 Units by mouth 2 (two) times daily.   famotidine 40 MG tablet Commonly known as:  PEPCID Take 40 mg by mouth daily.   feeding supplement (ENSURE ENLIVE) Liqd Take 237 mLs by mouth 2 (two) times daily between meals.   furosemide 20 MG tablet Commonly known as:  LASIX TAKE (1) TABLET DAILY AS NEEDED FOR SWELLING. What changed:  See the new instructions.   ipratropium 0.03 % nasal spray Commonly known as:  ATROVENT Place 2 sprays into both nostrils 2 (two) times daily.   levothyroxine 125 MCG tablet Commonly known as:  Synthroid Take 1 tablet (125 mcg total) by mouth daily before breakfast.   lidocaine 5 % Commonly known as:  LIDODERM Place 1 patch onto the skin daily as needed (pain). Remove & Discard patch within 12 hours or as directed by MD   Linzess 145 MCG Caps capsule Generic drug:  linaclotide Take 145 mcg by mouth daily before breakfast.   mexiletine 150 MG capsule Commonly known as:  MEXITIL Take 1 capsule (150 mg total) by mouth 2 (two) times a day.   nitroGLYCERIN 0.4 MG SL tablet Commonly known as:  Nitrostat Place 1 tablet (0.4 mg total) under the tongue every 5 (five) minutes x 3 doses as  needed for chest pain (Max 3 doses in 15 min. Call 911).   omeprazole 20 MG capsule Commonly known as:  PRILOSEC Take 20 mg by mouth daily as needed (acid reflux).   polyethylene glycol 17 g packet Commonly known as:  MIRALAX / GLYCOLAX Take 17 g by mouth daily as needed for mild constipation.   PROBIOTIC DAILY PO Take 1 capsule by mouth daily.   Restasis 0.05 % ophthalmic emulsion Generic drug:  cycloSPORINE Place 1 drop into both eyes 2 (two) times daily as needed (dry eyes).   SYMBICORT IN Inhale 1 puff into the lungs 2 (two) times daily.   traMADol 50 MG tablet Commonly known as:  ULTRAM Take 50 mg by mouth every 6 (six) hours as needed for moderate pain.   warfarin 5 MG tablet Commonly known as:  Coumadin Take  as directed. If you are unsure how to take this medication, talk to your nurse or doctor. Original instructions:  Take 1-1.5 tablets (5-7.5 mg total) by mouth See admin instructions. Take 7.5 mg by mouth only on Fridays; all other days take 5mg       Follow-up Information    [facility] Follow [facility].   Specialty:  Home Health Services Why:  They will do your home health care at your home          Consultations:  Cardiology  Procedures/Studies:  2D Echo: None obtained this admission  Ct Head Wo Contrast  Result Date: 07/23/2018 CLINICAL DATA:  85 year old male with a history of altered mental status EXAM: CT HEAD WITHOUT CONTRAST TECHNIQUE: Contiguous axial images were obtained from the base of the skull through the vertex without intravenous contrast. COMPARISON:  CT 02/07/2016 FINDINGS: Brain: No acute intracranial hemorrhage. No midline shift or mass effect. Gray-white differentiation maintained. Confluent hypodensity in the bilateral periventricular white matter. Focal hypodensity in the left centrum semiovale, unchanged. Mild senescent volume loss. Unremarkable appearance of the ventricular system. Vascular: Intracranial  atherosclerosis. Skull: No acute fracture.  No aggressive bone lesion identified. Sinuses/Orbits: Unremarkable appearance of the orbits. Mastoid air cells clear. No middle ear effusion. No significant sinus disease. Other: None IMPRESSION: Negative for acute intracranial abnormality. Evidence of chronic microvascular ischemic disease. Associated intracranial atherosclerosis. Electronically Signed   By: Corrie Mckusick D.O.   On: 07/23/2018 08:30   Dg Chest Portable 1 View  Result Date: 07/22/2018 CLINICAL DATA:  Shortness of breath today. History of cardiomyopathy. EXAM: PORTABLE CHEST 1 VIEW COMPARISON:  Single-view of the chest 07/06/2018. PA and lateral chest 09/01/2015. CT chest 07/11/2015. FINDINGS: There is cardiomegaly. The patient is status post aortic valve replacement and has an AICD in place. Atherosclerosis is noted. No pneumothorax or pleural effusion. Pulmonary vascular congestion is noted. No consolidative process is seen. No acute bony abnormality. IMPRESSION: Cardiomegaly and pulmonary vascular congestion. Electronically Signed   By: Inge Rise M.D.   On: 07/22/2018 17:28   Dg Chest Port 1 View  Result Date: 07/06/2018 CLINICAL DATA:  Syncope EXAM: PORTABLE CHEST 1 VIEW COMPARISON:  09/01/2015 FINDINGS: Cardiac enlargement. TAVR. AICD. Negative for heart failure or edema. Atherosclerotic aortic arch. Mild bibasilar atelectasis/infiltrate.  No effusion. IMPRESSION: Mild bibasilar airspace disease likely atelectasis. Negative for heart failure. Electronically Signed   By: Franchot Gallo M.D.   On: 07/06/2018 17:08      Subjective: No major events overnight of this morning.  Reports improvement in his weakness.  Denies chest pain, dyspnea or abdominal pain.   Discharge Exam: Vitals:   07/24/18 0831 07/24/18 0900  BP:  (!) 94/54  Pulse:  60  Resp:  18  Temp:  97.7 F (36.5 C)  SpO2: 98% 100%    GENERAL: Frail looking elderly man HEENT: MMM.  Vision and hearing grossly  intact.  NECK: Supple.  No JVD.  LUNGS:  No IWOB. Good air movement bilaterally. HEART:  RRR.  S1 and S2 audible ABD: Bowel sounds present. Soft. Non tender.  MSK/EXT:  Moves all extremities.  Significant muscle mass wasting. SKIN: no apparent skin lesion or wound NEURO: Awake, alert and oriented appropriately.  No gross deficit.  Able to ambulate to the bathroom using a walker. PSYCH: Calm. Normal affect.     The results of significant diagnostics from this hospitalization (including imaging, microbiology, ancillary and laboratory) are listed below for reference.     Microbiology:  Recent Results (from the past 240 hour(s))  SARS Coronavirus 2 (CEPHEID - Performed in [place] hospital lab), Hosp Order     Status: None   Collection Time: 07/22/18  5:25 PM  Result Value Ref Range Status   SARS Coronavirus 2 NEGATIVE NEGATIVE Final    Comment: (NOTE) If result is NEGATIVE SARS-CoV-2 target nucleic acids are NOT DETECTED. The SARS-CoV-2 RNA is generally detectable in upper and lower  respiratory specimens during the acute phase of infection. The lowest  concentration of SARS-CoV-2 viral copies this assay can detect is 250  copies / mL. A negative result does not preclude SARS-CoV-2 infection  and should not be used as the sole basis for treatment or other  patient management decisions.  A negative result may occur with  improper specimen collection / handling, submission of specimen other  than nasopharyngeal swab, presence of viral mutation(s) within the  areas targeted by this assay, and inadequate number of viral copies  (<250 copies / mL). A negative result must be combined with clinical  observations, patient history, and epidemiological information. If result is POSITIVE SARS-CoV-2 target nucleic acids are DETECTED. The SARS-CoV-2 RNA is generally detectable in upper and lower  respiratory specimens dur ing the acute phase of infection.  Positive  results are indicative  of active infection with SARS-CoV-2.  Clinical  correlation with patient history and other diagnostic information is  necessary to determine patient infection status.  Positive results do  not rule out bacterial infection or co-infection with other viruses. If result is PRESUMPTIVE POSTIVE SARS-CoV-2 nucleic acids MAY BE PRESENT.   A presumptive positive result was obtained on the submitted specimen  and confirmed on repeat testing.  While 2019 novel coronavirus  (SARS-CoV-2) nucleic acids may be present in the submitted sample  additional confirmatory testing may be necessary for epidemiological  and / or clinical management purposes  to differentiate between  SARS-CoV-2 and other Sarbecovirus currently known to infect humans.  If clinically indicated additional testing with an alternate test  methodology [phone]) is advised. The SARS-CoV-2 RNA is generally  detectable in upper and lower respiratory sp ecimens during the acute  phase of infection. The expected result is Negative. Fact Sheet for Patients:  StrictlyIdeas.no Fact Sheet for Healthcare Providers: BankingDealers.co.za This test is not yet approved or cleared by the [place] FDA and has been authorized for detection and/or diagnosis of SARS-CoV-2 by FDA under an Emergency Use Authorization (EUA).  This EUA will remain in effect (meaning this test can be used) for the duration of the COVID-19 declaration under Section 564(b)(1) of the Act, 21 U.S.C. section 360bbb-3(b)(1), unless the authorization is terminated or revoked sooner. Performed at [facility], [place] [address], [place], [place] [address]      Labs: BNP (last 3 results) Recent Labs    07/06/18 1114 07/22/18 1711  BNP 724.5* 268.3*   Basic Metabolic Panel: Recent Labs  Lab 07/22/18 1711 07/23/18 0451 07/24/18 0456  NA 123* 124* 126*  K 4.4 4.3 4.6  CL 90* 93* 93*  CO2 22 23 25   GLUCOSE 95  102* 94  BUN 11 8 12   CREATININE 0.84 0.78 0.93  CALCIUM 7.9* 8.2* 8.3*  MG  --  1.9  --    Liver Function Tests: Recent Labs  Lab 07/22/18 1711  AST 12*  ALT 11  ALKPHOS 49  BILITOT 0.8  PROT 5.1*  ALBUMIN 3.0*   No results for input(s): LIPASE, AMYLASE in the  last 168 hours. No results for input(s): AMMONIA in the last 168 hours. CBC: Recent Labs  Lab 07/22/18 1711 07/23/18 0451 07/24/18 0456  WBC 6.6 7.2 6.8  NEUTROABS 4.9  --   --   HGB 10.6* 11.5* 11.4*  HCT 33.1* 34.3* 34.2*  MCV 94.3 92.5 92.4  PLT 162 189 175   Cardiac Enzymes: Recent Labs  Lab 07/22/18 1711  TROPONINI <0.03   BNP: Invalid input(s): POCBNP CBG: No results for input(s): GLUCAP in the last 168 hours. D-Dimer No results for input(s): DDIMER in the last 72 hours. Hgb A1c No results for input(s): HGBA1C in the last 72 hours. Lipid Profile No results for input(s): CHOL, HDL, LDLCALC, TRIG, CHOLHDL, LDLDIRECT in the last 72 hours. Thyroid function studies Recent Labs    07/23/18 0451  TSH 3.768   Anemia work up No results for input(s): VITAMINB12, FOLATE, FERRITIN, TIBC, IRON, RETICCTPCT in the last 72 hours. Urinalysis    Component Value Date/Time   COLORURINE YELLOW 07/23/2018 0249   APPEARANCEUR CLEAR 07/23/2018 0249   LABSPEC 1.010 07/23/2018 0249   LABSPEC 1.010 11/16/2006 1032   PHURINE 6.5 07/23/2018 0249   GLUCOSEU NEGATIVE 07/23/2018 0249   HGBUR NEGATIVE 07/23/2018 0249   BILIRUBINUR NEGATIVE 07/23/2018 0249   BILIRUBINUR Negative 11/16/2006 1032   KETONESUR 15 (A) 07/23/2018 0249   PROTEINUR NEGATIVE 07/23/2018 0249   UROBILINOGEN 0.2 11/25/2014 0510   NITRITE NEGATIVE 07/23/2018 0249   LEUKOCYTESUR NEGATIVE 07/23/2018 0249   LEUKOCYTESUR Trace 11/16/2006 1032   Sepsis Labs Invalid input(s): PROCALCITONIN,  WBC,  LACTICIDVEN   Time coordinating discharge: 35 minutes  SIGNED:  Mercy Riding, MD  [facility] 07/24/2018, 11:28 AM Pager [phone]   If 7PM-7AM, please contact night-coverage [email or web address] Password [email or web address]

## 2018-07-24 NOTE — Progress Notes (Signed)
  Speech Language Pathology Treatment: Dysphagia  Patient Details Name: [name] MRN: [id number] DOB: April 26, 1932 Today's Date: 07/24/2018 Time: 3710-6269 SLP Time Calculation (min) (ACUTE ONLY): 20 min  Assessment / Plan / Recommendation Clinical Impression  Patient seen to address dysphagia goals and complete education of swallow exercises and safe swallow strategies. Patient eating lunch meal at time of SLP entering room and exhibited throat clearing, requiring him to swallow several times, as well as patient complaints of difficulty with swallowing. Patient was able to return demonstrate to perform swallow/pharyngeal strengthening exercises and SLP provided him with exercise hand out for home use. Patient asked SLP if having "4 meals" instead of 3 would be best and SLP educated patient that smaller more frequent meals would be best. SLP also educated patient regarding oral care, importance of water intake throughout the day, strategies to help with difficulty with pills (eating jello prior to, etc). Patient is capable of and is willing to utilize swallow strategies and perform exercises independently at home, and SLP advised patient to request SLP swallow evaluation if symptoms get worse.    HPI HPI: 85 y.o. male admitted with hx of CAD s/p CABG, afib, strokes, recent episodes of aspiration and frequent coughing when eating, weight loss, dehydration and weakness.  MBS in 2014; MBS 05/09/18: mild dysphagia with aspiration of thin liquids only when consumed in conjunction with 13 mm barium pill; pharyngeal residue. Otherwise, no aspiration.  Hx of esophageal stricture with dilatation, GERD.       SLP Plan  Discharge SLP treatment due to (comment)(patient discharging home this PM)       Recommendations  Diet recommendations: Regular;Thin liquid Medication Administration: Crushed with puree Supervision: Patient able to self feed Compensations: Follow solids with liquid;Slow rate;Small  sips/bites;Clear throat intermittently Postural Changes and/or Swallow Maneuvers: Seated upright 90 degrees;Upright 30-60 min after meal                Oral Care Recommendations: Oral care BID Follow up Recommendations: None SLP Visit Diagnosis: Dysphagia, oropharyngeal phase (R13.12) Plan: Discharge SLP treatment due to (comment)(patient discharging home this PM)       GO                Colie, Fugitt 07/24/2018, 1:20 PM    Sonia Baller, MA, CCC-SLP Speech Therapy [facility] Acute Rehab Pager: [phone]

## 2018-07-24 NOTE — Progress Notes (Signed)
HHC choice offered, pt [facility] [place]; Dan RN with [facility] called for arrangements; Aneta Mins [phone]   [place]  [phone]   [place] to my Favorites Quality of Patient Care Rating3 out of 5 stars Patient Survey Summary Rating4 out of [place]  [phone]   [place] to my Favorites Quality of Patient Care Rating3  out of 5 stars Patient Survey Summary Rating4 out of [place]  [phone]   [place] to my [place]  out of 5 stars Patient Survey Summary Rating3 out of [place]  [phone]   [place] to my Favorites Quality of Patient Care Rating4  out of 5 stars Patient Survey Summary Rating4 out of 5 stars  [place]  [phone]   [place] to my Favorites Quality of Patient Care Rating4 out of 5 stars Patient Survey Summary Rating4 out of 5 stars  [place]  [phone]   [place] to my Favorites Quality of Patient Care Rating4 out of 5 stars Patient Survey Summary Rating4 out of 5 stars  [facility] [place]  [phone]   Add [facility] [employer] to my Favorites Quality of Patient Care Rating3  out of 5 stars Patient Survey Summary Rating4 out of [place]  [phone]   [place] to my Favorites Quality of Patient Care Rating3 out of 5 stars Patient Survey Summary Rating4 out of 5 stars  [facility]  ([phone]) [phone]   Add [facility] to my Favorites Quality of Patient Care Rating3  out of 5 stars Patient Survey Summary Rating3 out of [place]  [phone]   [place] to my [place]  out of 5 stars Patient Survey Summary Rating4 out of [place]  [phone]   [place] to my [place]  out of 5 stars Patient Survey Summary Rating3 out of [place]  [phone]

## 2018-07-24 NOTE — Progress Notes (Signed)
ANTICOAGULATION CONSULT NOTE - Follow Up Consult  Pharmacy Consult for Coumadin Indication: atrial fibrillation  Allergies  Allergen Reactions  . Penicillins Shortness Of Breath and Rash    Has patient had a PCN reaction causing immediate rash, facial/tongue/throat swelling, SOB or lightheadedness with hypotension: Yes Has patient had a PCN reaction causing severe rash involving mucus membranes or skin necrosis: No Has patient had a PCN reaction that required hospitalization No Has patient had a PCN reaction occurring within the last 10 years: No If all of the above answers are "NO", then may proceed with Cephalosporin use.   . Tizanidine Shortness Of Breath    Light headed  . Ace Inhibitors     Has not tolerated in the past due to hyperkalemia  . Antihistamines, Diphenhydramine-Type Other (See Comments)    Inhibits urination  . Clemastine Fumarate Other (See Comments)    Inhibits urination  . Dimenhydrinate Other (See Comments)    Inhibits urination  . Amiodarone Other (See Comments)    Unknown     Patient Measurements: Height: 6\' 5"  (195.6 cm) Weight: 170 lb 6.4 oz (77.3 kg)(a scale) IBW/kg (Calculated) : 89.1  Vital Signs: Temp: 97.7 F (36.5 C) (05/13 0900) Temp Source: Oral (05/13 0900) BP: 94/54 (05/13 0900) Pulse Rate: 60 (05/13 0900)  Labs: Recent Labs    07/22/18 1711 07/22/18 1948 07/23/18 0451 07/24/18 0456  HGB 10.6*  --  11.5* 11.4*  HCT 33.1*  --  34.3* 34.2*  PLT 162  --  189 175  LABPROT  --  27.4*  --  27.0*  INR  --  2.6*  --  2.6*  CREATININE 0.84  --  0.78 0.93  TROPONINI <0.03  --   --   --     Estimated Creatinine Clearance: 64.6 mL/min (by C-G formula based on SCr of 0.93 mg/dL).  Assessment:     Anticoag: Chronic afib on warfarin PTA. Hgb 11.4 with h/o anemia, stable. INR 2.6 in goal. - PTA Coumadin 5mg  daily except 7.5mg  on Fridays, with admit INR 2.6  Goal of Therapy:  INR 2-3 Monitor platelets by anticoagulation protocol:  Yes   Plan:  Cont warfarin per home regimen, 5mg  daily except 7.5mg  on Fridays Daily PT/INR, adjust dose as needed  Monitor for bleeding  Roshon Duell S. Alford Highland, PharmD, BCPS Clinical Staff Pharmacist Eilene Ghazi [place] 07/24/2018,10:15 AM

## 2018-07-24 NOTE — Progress Notes (Signed)
Physical Therapy Treatment Patient Details Name: [name] MRN: [id number] DOB: 18-Apr-1932 Today's Date: 07/24/2018    History of Present Illness Pt is an 85 y.o. male admitted 07/22/18 with c/o weakness and confusion. EKG with afib rate controlled. Pt worked up for dehydration requiring IV fluids. Pt also with poor appetite, dysphagia with aspiration events. PMH includes CAD s/p CABG, afib, CHF, anemia, R knee OA, strokes, PE.    PT Comments    Focus of session stair training for d/c home this afternoon. Pt is currently mod I for bed mobility, supervision for transfers and ambulation of 20 feet with RW and min guard for ascent/descent of 4 steps with railing. D/c plans remain appropriate at this time. Plan for discharge home this afternoon.    Follow Up Recommendations  Home health PT;Supervision for mobility/OOB     Equipment Recommendations  None recommended by PT       Precautions / Restrictions Precautions Precautions: Fall Restrictions Weight Bearing Restrictions: No    Mobility  Bed Mobility Overal bed mobility: Modified Independent             General bed mobility comments: HOB slightly elevated  Transfers Overall transfer level: Needs assistance Equipment used: Rolling walker (2 wheeled) Transfers: Sit to/from Stand Sit to Stand: Supervision         General transfer comment: Able to stand from bed and recliner with RW, supervision for safety. Pt with good hand placement/technique without cues  Ambulation/Gait Ambulation/Gait assistance: Supervision Gait Distance (Feet): 20 Feet Assistive device: Rolling walker (2 wheeled) Gait Pattern/deviations: Step-through pattern;Decreased stride length;Trunk flexed Gait velocity: Decreased Gait velocity interpretation: <1.8 ft/sec, indicate of risk for recurrent falls General Gait Details: supervision for safety with slow, steady gait   Stairs Stairs: Yes Stairs assistance: Min guard Stair Management: One  rail Right;Sideways;Step to pattern Number of Stairs: 4 General stair comments: hands on min guard for safety with ascent/descent sideways, vc for sequencing to favor strong L side       Balance Overall balance assessment: Needs assistance   Sitting balance-Leahy Scale: Good       Standing balance-Leahy Scale: Poor Standing balance comment: Reliant on UE support                            Cognition Arousal/Alertness: Awake/alert Behavior During Therapy: WFL for tasks assessed/performed Overall Cognitive Status: Within Functional Limits for tasks assessed                                           General Comments General comments (skin integrity, edema, etc.): VSS      Pertinent Vitals/Pain Pain Assessment: Faces Faces Pain Scale: Hurts a little bit Pain Location: Back (chronic) Pain Descriptors / Indicators: Constant;Discomfort Pain Intervention(s): Limited activity within patient's tolerance;Monitored during session;Repositioned           PT Goals (current goals can now be found in the care plan section) Acute Rehab PT Goals Patient Stated Goal: Return home PT Goal Formulation: With patient Time For Goal Achievement: 08/06/18 Potential to Achieve Goals: Good    Frequency    Min 3X/week      PT Plan Current plan remains appropriate       AM-PAC PT "6 Clicks" Mobility   Outcome Measure  Help needed turning from your back to your side while  in a flat bed without using bedrails?: None Help needed moving from lying on your back to sitting on the side of a flat bed without using bedrails?: None Help needed moving to and from a bed to a chair (including a wheelchair)?: None Help needed standing up from a chair using your arms (e.g., wheelchair or bedside chair)?: A Little Help needed to walk in hospital room?: A Little Help needed climbing 3-5 steps with a railing? : A Little 6 Click Score: 21    End of Session Equipment  Utilized During Treatment: Gait belt Activity Tolerance: Patient tolerated treatment well Patient left: in chair;with call bell/phone within reach Nurse Communication: Mobility status PT Visit Diagnosis: Other abnormalities of gait and mobility (R26.89);Muscle weakness (generalized) (M62.81)     Time: 6015-6153 PT Time Calculation (min) (ACUTE ONLY): 24 min  Charges:  $Gait Training: 23-37 mins                     Jamill Wetmore B. Migdalia Dk PT, DPT Acute Rehabilitation Services Pager [phone] Office [phone]    [place] 07/24/2018, 1:00 PM

## 2018-07-24 NOTE — Progress Notes (Signed)
Nutrition Follow-up  RD working remotely.  DOCUMENTATION CODES:   Not applicable  INTERVENTION:   -D/c Prostat, due to poor acceptance -Continue Ensure Enlive po BID, each supplement provides 350 kcal and 20 grams of protein -Continue MVI with minerals daily -Magic Cup TID with meals, each supplement provides 290 kcals and 9 grams protein  NUTRITION DIAGNOSIS:   Inadequate oral intake related to dysphagia, decreased appetite as evidenced by meal completion < 25%, per patient/family report.  Ongoing  GOAL:   Patient will meet greater than or equal to 90% of their needs  Progressing   MONITOR:   PO intake, Supplement acceptance, Weight trends, Labs, Skin, I & O's  REASON FOR ASSESSMENT:   Consult Assessment of nutrition requirement/status, Poor PO  ASSESSMENT:   [name] is a 85 y.o. male with history of CAD status post CABG, atrial fibrillation, chronic systolic heart failure, chronic anemia, hypothyroidism, recent episodes of aspiration with difficulty swallowing was brought to the ER the patient was feeling weak over the last 3 days.  Patient states he also was not feeling himself at times confused.  EMS was called and patient was given 250 cc normal saline bolus and was brought to the ER.  Patient denies any chest pain nausea vomiting diarrhea.  Has been having poor appetite.  And has not been eating well and has lost 6 pounds.  5/12- s/p BSE- recommend regular diet with thin liquids to promote more food choices; previous MBSS on 2/20 reveals significant pharyngeal residue that accumulates and difficult to clear  Reviewed I/O's: -815 ml x 24 hours and -737 ml x 24 hours  RD re-consulted; note RD evaluated pt on 07/23/18. Please refer to assessment for additional details. It was very difficult to obtain history from pt over the phone.   Per review of meal records, pt's intake has improved. PO 25-100%. Per MD notes, pt would not desire a feeding tube. Pt is taking  Ensure supplements, however, refused prostat supplement this morning.   Reviewed wt hx, which reveals pt has only experienced a 2.6% wt loss over the past year. This is not consistent with pt report. Interpretation of wt reading difficult to due to presence of edema and use of lasix PTA.  Given ongoing dysphagia and decreased PO intake, agree with SLP assessment to provide pt with least restrictive diet possible. Pt would also benefit from addition of nutritional supplements.  Labs reviewed: Na: 124.   Diet Order:   Diet Order            Diet Heart Room service appropriate? Yes; Fluid consistency: Thin; Fluid restriction: 1200 mL Fluid  Diet effective now              EDUCATION NEEDS:   No education needs have been identified at this time  Skin:  Skin Assessment: Skin Integrity Issues: Skin Integrity Issues:: Other (Comment) Other: partial thickness wound to medial buttocks  Last BM:  07/22/18  Height:   Ht Readings from Last 1 Encounters:  07/22/18 6\' 5"  (1.956 m)    Weight:   Wt Readings from Last 1 Encounters:  07/23/18 77.3 kg    Ideal Body Weight:  94.5 kg  BMI:  Body mass index is 20.21 kg/m.  Estimated Nutritional Needs:   Kcal:  1900-2100  Protein:  95-110 grams  Fluid:  1.2 L    Iyani Dresner A. Jimmye Norman, RD, LDN, [place] Registered Dietitian II Certified Diabetes Care and Education Specialist Pager: [phone] After hours Pager: [phone]

## 2018-07-24 NOTE — TOC Initial Note (Signed)
Transition of Care [name]) - Initial/Assessment Note    Patient Details  Name: [name] MRN: [id number] Date of Birth: 22-Mar-1932  Transition of Care [facility] CM/SW Contact:    Sherrilyn Rist Phone Number: [phone] 07/24/2018, 10:31 AM  Clinical Narrative:                 Patient lives at home with spouse; his spouse takes him to his apts; PCP: [name], MD; has private insurance with Medicare / Lynda [place] with prescription drug coverage; pharmacy of choice is [facility]; patient reports no problem getting his medication states " my wife handles all of that." DME - walker at home; [facility], [facility] [place]; Dan with [facility] called for arrangements.  Expected Discharge Plan: [place] Barriers to Discharge: No Barriers Identified   Patient Goals and CMS Choice Patient states their goals for this hospitalization and ongoing recovery are:: to go home CMS Medicare.gov Compare Post Acute Care list provided to:: Patient Choice offered to / list presented to : Patient  Expected Discharge Plan and Services Expected Discharge Plan: [place] In-house Referral: NA Discharge Planning Services: CM Consult Post Acute Care Choice: NA Living arrangements for the past 2 months: Single Family Home                 DME Arranged: N/A DME Agency: NA       HH Arranged: RN, Disease Management, PT, OT, Nurse's Aide [place] Agency: [place] ([facility]) Date HH Agency Contacted: 07/24/18 Time [place]: 22 Representative spoke with at [place]: Hydrographic surveyor  Prior Living Arrangements/Services Living arrangements for the past 2 months: [place] with:: Spouse Patient language and need for interpreter reviewed:: Yes        Need for Family Participation in Patient Care: Yes (Comment) Care giver support system in place?: Yes (comment)   Criminal Activity/Legal Involvement  Pertinent to Current Situation/Hospitalization: No - Comment as needed  Activities of Daily Living Home Assistive Devices/Equipment: Walker (specify type) ADL Screening (condition at time of admission) Patient's cognitive ability adequate to safely complete daily activities?: Yes Is the patient deaf or have difficulty hearing?: Yes Does the patient have difficulty seeing, even when wearing glasses/contacts?: No Does the patient have difficulty concentrating, remembering, or making decisions?: No Patient able to express need for assistance with ADLs?: Yes Does the patient have difficulty dressing or bathing?: Yes Independently performs ADLs?: No Communication: Independent Dressing (OT): Needs assistance Grooming: Needs assistance Feeding: Independent Bathing: Needs assistance Toileting: Needs assistance Walks in Home: Needs assistance Does the patient have difficulty walking or climbing stairs?: Yes Weakness of Legs: Both Weakness of Arms/Hands: Both  Permission Sought/Granted Permission sought to share information with : Case Manager Permission granted to share information with : Yes, Verbal Permission Granted  Share Information with NAME: spouse  Permission granted to share info w AGENCY: [place] agency        Emotional Assessment Appearance:: Developmentally appropriate Attitude/Demeanor/Rapport: Gracious, Engaged Affect (typically observed): Accepting Orientation: : Oriented to Self, Oriented to Place, Oriented to Situation, Oriented to  Time Alcohol / Substance Use: Not Applicable Psych Involvement: No (comment)  Admission diagnosis:  Hyponatremia [E87.1] Acute on chronic systolic congestive heart failure (HCC) [I50.23] Patient Active Problem List   Diagnosis Date Noted  . Dehydration 07/22/2018  . Ventricular tachyarrhythmia ([place]) 07/06/2018  . Syncope 07/06/2018  . Pressure injury of skin 01/21/2017  .  Acute urinary retention 01/21/2017  . Shortness of breath   .  Weakness 09/01/2015  . Hyponatremia 09/01/2015  . Hypothyroidism 09/01/2015  . DOE (dyspnea on exertion) 09/01/2015  . Acute on chronic systolic congestive heart failure ([place]) 09/01/2015  . Exertional dyspnea 09/01/2015  . GI bleed 01/16/2014  . GIB (gastrointestinal bleeding) 01/16/2014  . Encounter for therapeutic drug monitoring 05/09/2013  . Automatic implantable cardioverter-defibrillator in situ 12/13/2012  . Rectal bleed 05/30/2012    Class: Acute  . Acute low back pain 05/30/2012    Class: Acute  . Hypotension 05/30/2012    Class: Acute  . Vertebral fracture, osteoporotic ([place]) 05/21/2012  . Gait instability 12/02/2011    Class: Acute  . Atrial fibrillation, chronic 12/02/2011    Class: Chronic  . Anticoagulated on Coumadin 12/02/2011  . Deformity, finger acquired 12/19/2010  . Chronic cough 12/05/2010  . S/P aortic valve replacement 10/22/2010  . Ischemic cardiomyopathy   . Diverticulitis   . Prostate cancer ([place])   . Osteoarthritis   . Cervical myelopathy ([place])   . Pulmonary embolism ([place])   . Hyperlipidemia   . GERD (gastroesophageal reflux disease)   . Esophageal stricture   . Indigestion 06/29/2010  . VENTRICULAR TACHYCARDIA 01/15/2009   PCP:  [name], MD Pharmacy:   [facility], [address] [place] [address] Phone: [phone] Fax: [phone]     Social Determinants of Health (SDOH) Interventions    Readmission Risk Interventions No flowsheet data found.

## 2018-07-25 DIAGNOSIS — Z952 Presence of prosthetic heart valve: Secondary | ICD-10-CM | POA: Diagnosis not present

## 2018-07-25 DIAGNOSIS — Z951 Presence of aortocoronary bypass graft: Secondary | ICD-10-CM | POA: Diagnosis not present

## 2018-07-25 DIAGNOSIS — L89312 Pressure ulcer of right buttock, stage 2: Secondary | ICD-10-CM | POA: Diagnosis not present

## 2018-07-25 DIAGNOSIS — I4821 Permanent atrial fibrillation: Secondary | ICD-10-CM | POA: Diagnosis not present

## 2018-07-25 DIAGNOSIS — I2510 Atherosclerotic heart disease of native coronary artery without angina pectoris: Secondary | ICD-10-CM | POA: Diagnosis not present

## 2018-07-25 DIAGNOSIS — Z95810 Presence of automatic (implantable) cardiac defibrillator: Secondary | ICD-10-CM | POA: Diagnosis not present

## 2018-07-25 DIAGNOSIS — Z86711 Personal history of pulmonary embolism: Secondary | ICD-10-CM | POA: Diagnosis not present

## 2018-07-25 DIAGNOSIS — Z87891 Personal history of nicotine dependence: Secondary | ICD-10-CM | POA: Diagnosis not present

## 2018-07-25 DIAGNOSIS — S81802D Unspecified open wound, left lower leg, subsequent encounter: Secondary | ICD-10-CM | POA: Diagnosis not present

## 2018-07-25 DIAGNOSIS — I5022 Chronic systolic (congestive) heart failure: Secondary | ICD-10-CM | POA: Diagnosis not present

## 2018-07-26 ENCOUNTER — Ambulatory Visit (INDEPENDENT_AMBULATORY_CARE_PROVIDER_SITE_OTHER): Payer: Medicare Other | Admitting: Pharmacist

## 2018-07-26 DIAGNOSIS — Z5181 Encounter for therapeutic drug level monitoring: Secondary | ICD-10-CM

## 2018-07-26 DIAGNOSIS — I482 Chronic atrial fibrillation, unspecified: Secondary | ICD-10-CM

## 2018-07-26 LAB — POCT INR: INR: 2.2 (ref 2.0–3.0)

## 2018-07-29 DIAGNOSIS — I5022 Chronic systolic (congestive) heart failure: Secondary | ICD-10-CM | POA: Diagnosis not present

## 2018-07-29 DIAGNOSIS — L89312 Pressure ulcer of right buttock, stage 2: Secondary | ICD-10-CM | POA: Diagnosis not present

## 2018-07-29 DIAGNOSIS — I2510 Atherosclerotic heart disease of native coronary artery without angina pectoris: Secondary | ICD-10-CM | POA: Diagnosis not present

## 2018-07-29 DIAGNOSIS — S81802D Unspecified open wound, left lower leg, subsequent encounter: Secondary | ICD-10-CM | POA: Diagnosis not present

## 2018-07-29 DIAGNOSIS — I4821 Permanent atrial fibrillation: Secondary | ICD-10-CM | POA: Diagnosis not present

## 2018-07-29 DIAGNOSIS — Z952 Presence of prosthetic heart valve: Secondary | ICD-10-CM | POA: Diagnosis not present

## 2018-07-31 DIAGNOSIS — L89312 Pressure ulcer of right buttock, stage 2: Secondary | ICD-10-CM | POA: Diagnosis not present

## 2018-07-31 DIAGNOSIS — S81802D Unspecified open wound, left lower leg, subsequent encounter: Secondary | ICD-10-CM | POA: Diagnosis not present

## 2018-07-31 DIAGNOSIS — Z952 Presence of prosthetic heart valve: Secondary | ICD-10-CM | POA: Diagnosis not present

## 2018-07-31 DIAGNOSIS — I5022 Chronic systolic (congestive) heart failure: Secondary | ICD-10-CM | POA: Diagnosis not present

## 2018-07-31 DIAGNOSIS — I4821 Permanent atrial fibrillation: Secondary | ICD-10-CM | POA: Diagnosis not present

## 2018-07-31 DIAGNOSIS — I2510 Atherosclerotic heart disease of native coronary artery without angina pectoris: Secondary | ICD-10-CM | POA: Diagnosis not present

## 2018-08-01 DIAGNOSIS — I5022 Chronic systolic (congestive) heart failure: Secondary | ICD-10-CM | POA: Diagnosis not present

## 2018-08-01 DIAGNOSIS — I472 Ventricular tachycardia: Secondary | ICD-10-CM | POA: Diagnosis not present

## 2018-08-01 DIAGNOSIS — E46 Unspecified protein-calorie malnutrition: Secondary | ICD-10-CM | POA: Diagnosis not present

## 2018-08-01 DIAGNOSIS — K5909 Other constipation: Secondary | ICD-10-CM | POA: Diagnosis not present

## 2018-08-01 DIAGNOSIS — Z95810 Presence of automatic (implantable) cardiac defibrillator: Secondary | ICD-10-CM | POA: Diagnosis not present

## 2018-08-01 DIAGNOSIS — Z7901 Long term (current) use of anticoagulants: Secondary | ICD-10-CM | POA: Diagnosis not present

## 2018-08-01 DIAGNOSIS — E785 Hyperlipidemia, unspecified: Secondary | ICD-10-CM | POA: Diagnosis not present

## 2018-08-01 DIAGNOSIS — I4821 Permanent atrial fibrillation: Secondary | ICD-10-CM | POA: Diagnosis not present

## 2018-08-01 DIAGNOSIS — R1310 Dysphagia, unspecified: Secondary | ICD-10-CM | POA: Diagnosis not present

## 2018-08-01 DIAGNOSIS — R627 Adult failure to thrive: Secondary | ICD-10-CM | POA: Diagnosis not present

## 2018-08-01 DIAGNOSIS — E871 Hypo-osmolality and hyponatremia: Secondary | ICD-10-CM | POA: Diagnosis not present

## 2018-08-01 DIAGNOSIS — R269 Unspecified abnormalities of gait and mobility: Secondary | ICD-10-CM | POA: Diagnosis not present

## 2018-08-02 ENCOUNTER — Ambulatory Visit (INDEPENDENT_AMBULATORY_CARE_PROVIDER_SITE_OTHER): Payer: Medicare Other | Admitting: Internal Medicine

## 2018-08-02 DIAGNOSIS — I482 Chronic atrial fibrillation, unspecified: Secondary | ICD-10-CM

## 2018-08-02 DIAGNOSIS — Z5181 Encounter for therapeutic drug level monitoring: Secondary | ICD-10-CM | POA: Diagnosis not present

## 2018-08-02 LAB — POCT INR: INR: 2.6 (ref 2.0–3.0)

## 2018-08-06 ENCOUNTER — Other Ambulatory Visit: Payer: Self-pay

## 2018-08-06 ENCOUNTER — Encounter (HOSPITAL_COMMUNITY): Payer: Self-pay

## 2018-08-06 ENCOUNTER — Emergency Department (HOSPITAL_COMMUNITY): Payer: Medicare Other

## 2018-08-06 ENCOUNTER — Inpatient Hospital Stay (HOSPITAL_COMMUNITY)
Admission: EM | Admit: 2018-08-06 | Discharge: 2018-08-09 | DRG: 177 | Disposition: A | Discharge status: Home Health | Payer: Medicare Other | Attending: Internal Medicine | Admitting: Internal Medicine

## 2018-08-06 ENCOUNTER — Telehealth: Payer: Self-pay | Admitting: Internal Medicine

## 2018-08-06 DIAGNOSIS — E871 Hypo-osmolality and hyponatremia: Secondary | ICD-10-CM | POA: Diagnosis not present

## 2018-08-06 DIAGNOSIS — Z95810 Presence of automatic (implantable) cardiac defibrillator: Secondary | ICD-10-CM | POA: Diagnosis present

## 2018-08-06 DIAGNOSIS — M810 Age-related osteoporosis without current pathological fracture: Secondary | ICD-10-CM | POA: Diagnosis present

## 2018-08-06 DIAGNOSIS — I5022 Chronic systolic (congestive) heart failure: Secondary | ICD-10-CM | POA: Diagnosis present

## 2018-08-06 DIAGNOSIS — Z8249 Family history of ischemic heart disease and other diseases of the circulatory system: Secondary | ICD-10-CM

## 2018-08-06 DIAGNOSIS — J441 Chronic obstructive pulmonary disease with (acute) exacerbation: Secondary | ICD-10-CM | POA: Diagnosis present

## 2018-08-06 DIAGNOSIS — I252 Old myocardial infarction: Secondary | ICD-10-CM

## 2018-08-06 DIAGNOSIS — Z923 Personal history of irradiation: Secondary | ICD-10-CM

## 2018-08-06 DIAGNOSIS — E039 Hypothyroidism, unspecified: Secondary | ICD-10-CM | POA: Diagnosis present

## 2018-08-06 DIAGNOSIS — K219 Gastro-esophageal reflux disease without esophagitis: Secondary | ICD-10-CM | POA: Diagnosis present

## 2018-08-06 DIAGNOSIS — Z86711 Personal history of pulmonary embolism: Secondary | ICD-10-CM

## 2018-08-06 DIAGNOSIS — Z9049 Acquired absence of other specified parts of digestive tract: Secondary | ICD-10-CM

## 2018-08-06 DIAGNOSIS — Z79899 Other long term (current) drug therapy: Secondary | ICD-10-CM

## 2018-08-06 DIAGNOSIS — I4821 Permanent atrial fibrillation: Secondary | ICD-10-CM | POA: Diagnosis not present

## 2018-08-06 DIAGNOSIS — I2510 Atherosclerotic heart disease of native coronary artery without angina pectoris: Secondary | ICD-10-CM | POA: Diagnosis present

## 2018-08-06 DIAGNOSIS — Z7901 Long term (current) use of anticoagulants: Secondary | ICD-10-CM | POA: Diagnosis not present

## 2018-08-06 DIAGNOSIS — E785 Hyperlipidemia, unspecified: Secondary | ICD-10-CM | POA: Diagnosis present

## 2018-08-06 DIAGNOSIS — Z7982 Long term (current) use of aspirin: Secondary | ICD-10-CM

## 2018-08-06 DIAGNOSIS — R1313 Dysphagia, pharyngeal phase: Secondary | ICD-10-CM | POA: Diagnosis present

## 2018-08-06 DIAGNOSIS — Z79890 Hormone replacement therapy (postmenopausal): Secondary | ICD-10-CM

## 2018-08-06 DIAGNOSIS — R3915 Urgency of urination: Secondary | ICD-10-CM | POA: Diagnosis present

## 2018-08-06 DIAGNOSIS — I482 Chronic atrial fibrillation, unspecified: Secondary | ICD-10-CM | POA: Diagnosis not present

## 2018-08-06 DIAGNOSIS — J9600 Acute respiratory failure, unspecified whether with hypoxia or hypercapnia: Secondary | ICD-10-CM | POA: Diagnosis present

## 2018-08-06 DIAGNOSIS — J690 Pneumonitis due to inhalation of food and vomit: Principal | ICD-10-CM | POA: Diagnosis present

## 2018-08-06 DIAGNOSIS — R350 Frequency of micturition: Secondary | ICD-10-CM | POA: Diagnosis present

## 2018-08-06 DIAGNOSIS — Z20828 Contact with and (suspected) exposure to other viral communicable diseases: Secondary | ICD-10-CM | POA: Diagnosis present

## 2018-08-06 DIAGNOSIS — Z951 Presence of aortocoronary bypass graft: Secondary | ICD-10-CM

## 2018-08-06 DIAGNOSIS — E038 Other specified hypothyroidism: Secondary | ICD-10-CM | POA: Diagnosis not present

## 2018-08-06 DIAGNOSIS — Z952 Presence of prosthetic heart valve: Secondary | ICD-10-CM

## 2018-08-06 DIAGNOSIS — R0689 Other abnormalities of breathing: Secondary | ICD-10-CM | POA: Diagnosis not present

## 2018-08-06 DIAGNOSIS — Z888 Allergy status to other drugs, medicaments and biological substances status: Secondary | ICD-10-CM

## 2018-08-06 DIAGNOSIS — J9601 Acute respiratory failure with hypoxia: Secondary | ICD-10-CM | POA: Diagnosis present

## 2018-08-06 DIAGNOSIS — F0390 Unspecified dementia without behavioral disturbance: Secondary | ICD-10-CM | POA: Diagnosis present

## 2018-08-06 DIAGNOSIS — I4891 Unspecified atrial fibrillation: Secondary | ICD-10-CM | POA: Diagnosis not present

## 2018-08-06 DIAGNOSIS — I69391 Dysphagia following cerebral infarction: Secondary | ICD-10-CM

## 2018-08-06 DIAGNOSIS — Z833 Family history of diabetes mellitus: Secondary | ICD-10-CM

## 2018-08-06 DIAGNOSIS — Z79891 Long term (current) use of opiate analgesic: Secondary | ICD-10-CM

## 2018-08-06 DIAGNOSIS — I447 Left bundle-branch block, unspecified: Secondary | ICD-10-CM | POA: Diagnosis not present

## 2018-08-06 DIAGNOSIS — R0602 Shortness of breath: Secondary | ICD-10-CM | POA: Diagnosis not present

## 2018-08-06 DIAGNOSIS — I255 Ischemic cardiomyopathy: Secondary | ICD-10-CM | POA: Diagnosis present

## 2018-08-06 DIAGNOSIS — M1711 Unilateral primary osteoarthritis, right knee: Secondary | ICD-10-CM | POA: Diagnosis present

## 2018-08-06 DIAGNOSIS — Z880 Allergy status to penicillin: Secondary | ICD-10-CM

## 2018-08-06 DIAGNOSIS — Z7951 Long term (current) use of inhaled steroids: Secondary | ICD-10-CM

## 2018-08-06 DIAGNOSIS — N401 Enlarged prostate with lower urinary tract symptoms: Secondary | ICD-10-CM | POA: Diagnosis present

## 2018-08-06 DIAGNOSIS — Z87891 Personal history of nicotine dependence: Secondary | ICD-10-CM

## 2018-08-06 DIAGNOSIS — R0902 Hypoxemia: Secondary | ICD-10-CM | POA: Diagnosis not present

## 2018-08-06 DIAGNOSIS — Z8546 Personal history of malignant neoplasm of prostate: Secondary | ICD-10-CM

## 2018-08-06 HISTORY — DX: Presence of automatic (implantable) cardiac defibrillator: Z95.810

## 2018-08-06 LAB — PROTIME-INR
INR: 2.7 — ABNORMAL HIGH (ref 0.8–1.2)
Prothrombin Time: 28.5 seconds — ABNORMAL HIGH (ref 11.4–15.2)

## 2018-08-06 LAB — BASIC METABOLIC PANEL
Anion gap: 11 (ref 5–15)
BUN: 10 mg/dL (ref 8–23)
CO2: 24 mmol/L (ref 22–32)
Calcium: 8.7 mg/dL — ABNORMAL LOW (ref 8.9–10.3)
Chloride: 91 mmol/L — ABNORMAL LOW (ref 98–111)
Creatinine, Ser: 0.96 mg/dL (ref 0.61–1.24)
GFR calc Af Amer: >60 mL/min (ref >60)
GFR calc non Af Amer: >60 mL/min (ref >60)
Glucose, Bld: 103 mg/dL — ABNORMAL HIGH (ref 70–99)
Potassium: 4.5 mmol/L (ref 3.5–5.1)
Sodium: 126 mmol/L — ABNORMAL LOW (ref 135–145)

## 2018-08-06 LAB — BRAIN NATRIURETIC PEPTIDE: B Natriuretic Peptide: 904 pg/mL — ABNORMAL HIGH (ref 0.0–100.0)

## 2018-08-06 LAB — CBC WITH DIFFERENTIAL/PLATELET
Abs Immature Granulocytes: 0.03 10*3/uL (ref 0.00–0.07)
Basophils Absolute: 0 10*3/uL (ref 0.0–0.1)
Basophils Relative: 0 %
Eosinophils Absolute: 0.3 10*3/uL (ref 0.0–0.5)
Eosinophils Relative: 3 %
HCT: 38.4 % — ABNORMAL LOW (ref 39.0–52.0)
Hemoglobin: 12.4 g/dL — ABNORMAL LOW (ref 13.0–17.0)
Immature Granulocytes: 0 %
Lymphocytes Relative: 5 %
Lymphs Abs: 0.5 10*3/uL — ABNORMAL LOW (ref 0.7–4.0)
MCH: 30.2 pg (ref 26.0–34.0)
MCHC: 32.3 g/dL (ref 30.0–36.0)
MCV: 93.7 fL (ref 80.0–100.0)
Monocytes Absolute: 0.2 10*3/uL (ref 0.1–1.0)
Monocytes Relative: 3 %
Neutro Abs: 8.5 10*3/uL — ABNORMAL HIGH (ref 1.7–7.7)
Neutrophils Relative %: 89 %
Platelets: 274 10*3/uL (ref 150–400)
RBC: 4.1 MIL/uL — ABNORMAL LOW (ref 4.22–5.81)
RDW: 13.4 % (ref 11.5–15.5)
WBC: 9.6 10*3/uL (ref 4.0–10.5)
nRBC: 0 % (ref 0.0–0.2)

## 2018-08-06 LAB — SARS CORONAVIRUS 2 BY RT PCR (HOSPITAL ORDER, PERFORMED IN ~~LOC~~ HOSPITAL LAB): SARS Coronavirus 2: NEGATIVE

## 2018-08-06 LAB — TROPONIN I: Troponin I: <0.03 ng/mL (ref <0.03)

## 2018-08-06 MED ORDER — PANTOPRAZOLE SODIUM 40 MG PO TBEC: 40.0000 mg | DELAYED_RELEASE_TABLET | Freq: Every day | ORAL | Status: DC

## 2018-08-06 MED ORDER — METHYLPREDNISOLONE SODIUM SUCC 125 MG IJ SOLR
80.0000 mg | Freq: Once | INTRAMUSCULAR | Status: AC
  Administered 2018-08-06: 80 mg via INTRAVENOUS
  Filled 2018-08-06: qty 2

## 2018-08-06 MED ORDER — LIDOCAINE 5 % EX PTCH: 1.0000 | MEDICATED_PATCH | Freq: Every day | CUTANEOUS | Status: DC | PRN

## 2018-08-06 MED ORDER — ALBUTEROL SULFATE (2.5 MG/3ML) 0.083% IN NEBU: 5.0000 mg | INHALATION_SOLUTION | Freq: Once | RESPIRATORY_TRACT | Status: DC

## 2018-08-06 MED ORDER — LEVOTHYROXINE SODIUM 25 MCG PO TABS
125.0000 ug | ORAL_TABLET | Freq: Every day | ORAL | Status: DC
  Administered 2018-08-07 – 2018-08-09 (×3): 125 ug via ORAL
  Filled 2018-08-06 (×3): qty 1

## 2018-08-06 MED ORDER — WARFARIN - PHARMACIST DOSING INPATIENT
Freq: Every day | Status: DC
  Administered 2018-08-08: 18:00:00

## 2018-08-06 MED ORDER — FLUTICASONE FUROATE-VILANTEROL 200-25 MCG/INH IN AEPB
1.0000 | INHALATION_SPRAY | Freq: Every day | RESPIRATORY_TRACT | Status: DC
  Administered 2018-08-08 – 2018-08-09 (×2): 1 via RESPIRATORY_TRACT
  Filled 2018-08-06: qty 28

## 2018-08-06 MED ORDER — ACETAMINOPHEN 650 MG RE SUPP: 650.0000 mg | Freq: Four times a day (QID) | RECTAL | Status: DC | PRN

## 2018-08-06 MED ORDER — MEXILETINE HCL 150 MG PO CAPS
150.0000 mg | ORAL_CAPSULE | Freq: Two times a day (BID) | ORAL | Status: DC
  Administered 2018-08-06 – 2018-08-09 (×6): 150 mg via ORAL
  Filled 2018-08-06 (×8): qty 1

## 2018-08-06 MED ORDER — PROBIOTIC DAILY PO CAPS: 1.0000 | ORAL_CAPSULE | Freq: Every day | ORAL | Status: DC

## 2018-08-06 MED ORDER — CYCLOSPORINE 0.05 % OP EMUL
1.0000 [drp] | Freq: Two times a day (BID) | OPHTHALMIC | Status: DC | PRN
  Filled 2018-08-06: qty 30

## 2018-08-06 MED ORDER — NITROGLYCERIN 0.4 MG SL SUBL: 0.4000 mg | SUBLINGUAL_TABLET | SUBLINGUAL | Status: DC | PRN

## 2018-08-06 MED ORDER — CARVEDILOL 12.5 MG PO TABS
6.2500 mg | ORAL_TABLET | Freq: Two times a day (BID) | ORAL | Status: DC
  Administered 2018-08-06 – 2018-08-09 (×6): 6.25 mg via ORAL
  Filled 2018-08-06 (×6): qty 1

## 2018-08-06 MED ORDER — IPRATROPIUM BROMIDE 0.03 % NA SOLN: 2.0000 | Freq: Two times a day (BID) | NASAL | Status: DC

## 2018-08-06 MED ORDER — BENZONATATE 100 MG PO CAPS: 100.0000 mg | ORAL_CAPSULE | Freq: Three times a day (TID) | ORAL | Status: DC | PRN

## 2018-08-06 MED ORDER — ENSURE ENLIVE PO LIQD
237.0000 mL | Freq: Two times a day (BID) | ORAL | Status: DC
  Administered 2018-08-06 – 2018-08-08 (×3): 237 mL via ORAL

## 2018-08-06 MED ORDER — CIPROFLOXACIN IN D5W 400 MG/200ML IV SOLN
400.0000 mg | Freq: Once | INTRAVENOUS | Status: AC
  Administered 2018-08-06: 400 mg via INTRAVENOUS
  Filled 2018-08-06: qty 200

## 2018-08-06 MED ORDER — IPRATROPIUM-ALBUTEROL 20-100 MCG/ACT IN AERS
2.0000 | INHALATION_SPRAY | Freq: Four times a day (QID) | RESPIRATORY_TRACT | Status: DC
  Administered 2018-08-06: 2 via RESPIRATORY_TRACT
  Filled 2018-08-06 (×2): qty 4

## 2018-08-06 MED ORDER — ASPIRIN EC 81 MG PO TBEC
81.0000 mg | DELAYED_RELEASE_TABLET | Freq: Every day | ORAL | Status: DC
  Administered 2018-08-06 – 2018-08-09 (×4): 81 mg via ORAL
  Filled 2018-08-06 (×4): qty 1

## 2018-08-06 MED ORDER — ACETAMINOPHEN 325 MG PO TABS: 650.0000 mg | ORAL_TABLET | Freq: Four times a day (QID) | ORAL | Status: DC | PRN

## 2018-08-06 MED ORDER — CIPROFLOXACIN IN D5W 400 MG/200ML IV SOLN
400.0000 mg | Freq: Two times a day (BID) | INTRAVENOUS | Status: DC
  Administered 2018-08-06 – 2018-08-09 (×6): 400 mg via INTRAVENOUS
  Filled 2018-08-06 (×6): qty 200

## 2018-08-06 MED ORDER — ALFUZOSIN HCL ER 10 MG PO TB24
10.0000 mg | ORAL_TABLET | Freq: Every day | ORAL | Status: DC
  Administered 2018-08-06 – 2018-08-08 (×3): 10 mg via ORAL
  Filled 2018-08-06 (×3): qty 1

## 2018-08-06 MED ORDER — TRAMADOL HCL 50 MG PO TABS: 50.0000 mg | ORAL_TABLET | Freq: Four times a day (QID) | ORAL | Status: DC | PRN

## 2018-08-06 MED ORDER — ONDANSETRON HCL 4 MG PO TABS: 4.0000 mg | ORAL_TABLET | Freq: Four times a day (QID) | ORAL | Status: DC | PRN

## 2018-08-06 MED ORDER — BUDESONIDE-FORMOTEROL FUMARATE 160-4.5 MCG/ACT IN AERO: 2.0000 | INHALATION_SPRAY | Freq: Two times a day (BID) | RESPIRATORY_TRACT | Status: DC

## 2018-08-06 MED ORDER — ONDANSETRON HCL 4 MG/2ML IJ SOLN
4.0000 mg | Freq: Four times a day (QID) | INTRAMUSCULAR | Status: DC | PRN
  Administered 2018-08-08 – 2018-08-09 (×2): 4 mg via INTRAVENOUS
  Filled 2018-08-06 (×2): qty 2

## 2018-08-06 MED ORDER — FAMOTIDINE 20 MG PO TABS
40.0000 mg | ORAL_TABLET | Freq: Every day | ORAL | Status: DC
  Administered 2018-08-07 – 2018-08-09 (×3): 40 mg via ORAL
  Filled 2018-08-06 (×3): qty 2

## 2018-08-06 MED ORDER — LINACLOTIDE 145 MCG PO CAPS
145.0000 ug | ORAL_CAPSULE | Freq: Every day | ORAL | Status: DC
  Administered 2018-08-07 – 2018-08-09 (×3): 145 ug via ORAL
  Filled 2018-08-06 (×3): qty 1

## 2018-08-06 MED ORDER — WARFARIN SODIUM 5 MG PO TABS
5.0000 mg | ORAL_TABLET | Freq: Once | ORAL | Status: AC
  Administered 2018-08-06: 5 mg via ORAL
  Filled 2018-08-06: qty 1

## 2018-08-06 MED ORDER — FUROSEMIDE 20 MG PO TABS
20.0000 mg | ORAL_TABLET | Freq: Every day | ORAL | Status: DC
  Administered 2018-08-06: 20 mg via ORAL
  Filled 2018-08-06: qty 1

## 2018-08-06 MED ORDER — ALBUTEROL SULFATE (2.5 MG/3ML) 0.083% IN NEBU
2.5000 mg | INHALATION_SOLUTION | Freq: Four times a day (QID) | RESPIRATORY_TRACT | Status: DC
  Administered 2018-08-06 – 2018-08-07 (×3): 2.5 mg via RESPIRATORY_TRACT
  Filled 2018-08-06 (×3): qty 3

## 2018-08-06 NOTE — ED Triage Notes (Signed)
Pt began having SOB around 0730 with no relief with his symbicort. EMS was contacted, O2 was in the upper 80's (per EMS) on RA upon their arrival, with crackles hheard in lower left lobe, with NRB used his O2 was 95% upon arrival to ED.

## 2018-08-06 NOTE — ED Notes (Signed)
Pt stated that when he swallowed water this morning he believes that he aspirated on it & began the difficulty breathing he has now.

## 2018-08-06 NOTE — ED Notes (Cosign Needed)
Pt. Stated, You can call my wife. [phone] Pt. Needed to urinate. Tried to sit him up and Josh, Tech, help to sit on side of bed to urinate but not successful. Called wife Joe to update her

## 2018-08-06 NOTE — ED Notes (Signed)
Attempted to give report to [facility]

## 2018-08-06 NOTE — ED Provider Notes (Signed)
[facility] EMERGENCY DEPARTMENT Provider Note   CSN: [id number] Arrival date & time: 08/06/18  2423    History   Chief Complaint Chief Complaint  Patient presents with   Shortness of Breath    HPI [name] is a 85 y.o. male.     HPI   86yM with dyspnea. Acute on chronic. Says choking/coughing after drinking water this morning. Thinks may have aspirated Breathing acutely worse since that time. No pain. No fever or chills. Mild chronic L foot swelling since CABG many years ago. No sick contacts that he is aware of.   Past Medical History:  Diagnosis Date   Aortic stenosis, severe    WITH PERCUTANEOUS AORTIC VALVE (TAVI) IN March 2012 at the [facility]   Aspiration into airway    Cervical myelopathy (HCC)    Chronic anticoagulation    on coumadin   Chronic systolic CHF (congestive heart failure) ([place])    Coronary artery disease    a.  s/p CABG 1979 and redo 1991.   Coughing    Diverticulitis    CURRENTLY CONTROLLED WITH NO EVIDENCE OF RECURRENT INFECTION   Esophageal stricture    WITH DILATATION   Frailty    GERD (gastroesophageal reflux disease)    Heart murmur    Hyperlipidemia    Hyponatremia    Ischemic cardiomyopathy 05/2010   Has EF of 25%   MI (myocardial infarction) ([place]) 1974, 1978   Osteoarthritis    RIGHT KNEE   Osteoporosis    Permanent atrial fibrillation    Has not tolerated amiodarone in the past. Amiodarone was stopped in September of 2010 due to side effects.   Prostate cancer [facility])    Pulmonary embolism ([place])    Stroke ([place])    3 strokes   VT (ventricular tachycardia) ([place])     Patient Active Problem List   Diagnosis Date Noted   Dehydration 07/22/2018   Ventricular tachyarrhythmia ([place]) 07/06/2018   Syncope 07/06/2018   Pressure injury of skin 01/21/2017   Acute urinary retention 01/21/2017   Shortness of breath    Weakness 09/01/2015   Hyponatremia 09/01/2015    Hypothyroidism 09/01/2015   DOE (dyspnea on exertion) 09/01/2015   Acute on chronic systolic congestive heart failure ([place]) 09/01/2015   Exertional dyspnea 09/01/2015   GI bleed 01/16/2014   GIB (gastrointestinal bleeding) 01/16/2014   Encounter for therapeutic drug monitoring 05/09/2013   Automatic implantable cardioverter-defibrillator in situ 12/13/2012   Rectal bleed 05/30/2012    Class: Acute   Acute low back pain 05/30/2012    Class: Acute   Hypotension 05/30/2012    Class: Acute   Vertebral fracture, osteoporotic ([place]) 05/21/2012   Gait instability 12/02/2011    Class: Acute   Atrial fibrillation, chronic 12/02/2011    Class: Chronic   Anticoagulated on Coumadin 12/02/2011   Deformity, finger acquired 12/19/2010   Chronic cough 12/05/2010   S/P aortic valve replacement 10/22/2010   Ischemic cardiomyopathy    Diverticulitis    Prostate cancer ([place])    Osteoarthritis    Cervical myelopathy ([place])    Pulmonary embolism ([place])    Hyperlipidemia    GERD (gastroesophageal reflux disease)    Esophageal stricture    Indigestion 06/29/2010   VENTRICULAR TACHYCARDIA 01/15/2009    Past Surgical History:  Procedure Laterality Date   AORTIC VALVE REPLACEMENT     Percutaneous AVR in March 2012 at the [facility]  2010   SEVERE LV DYSFUNCTION WITH ESTIMATED EJECTION FRACTION OF 25%   CARDIOVERSION  02/16/2011   Procedure: CARDIOVERSION;  Surgeon: Carlena Bjornstad, MD;  Location: [facility];  Service: Cardiovascular;  Laterality: N/A;   [place]   ICD  Feb 2003; 02/2013   gen change 02-11-2013 by Dr Caryl Comes   IMPLANTABLE CARDIOVERTER DEFIBRILLATOR (ICD) GENERATOR CHANGE N/A 02/10/2013   Procedure: ICD GENERATOR CHANGE;  Surgeon: Deboraha Sprang, MD;  Location: [facility] CATH LAB;  Service: Cardiovascular;   Laterality: N/A;   SHOULDER SURGERY          Home Medications    Prior to Admission medications   Medication Sig Start Date End Date Taking? Authorizing Provider  alfuzosin (UROXATRAL) 10 MG 24 hr tablet Take 10 mg by mouth at bedtime.    Yes [provider]  aspirin 81 MG tablet Take 81 mg by mouth daily.   Yes [provider]  benzonatate (TESSALON) 100 MG capsule Take 100 mg by mouth 3 (three) times daily as needed for cough.   Yes [provider]  Budesonide-Formoterol Fumarate (SYMBICORT IN) Inhale 1 puff into the lungs 2 (two) times daily.   Yes [provider]  carvedilol (COREG) 6.25 MG tablet TAKE 1 TABLET BY MOUTH TWICE DAILY WITH A MEAL. Patient taking differently: Take 6.25 mg by mouth 2 (two) times daily with a meal.  07/01/18  Yes Sherren Mocha, MD  cholecalciferol (VITAMIN D) 1000 UNITS tablet Take 1,000 Units by mouth 2 (two) times daily.   Yes [provider]  famotidine (PEPCID) 40 MG tablet Take 40 mg by mouth daily.   Yes [provider]  feeding supplement, ENSURE ENLIVE, (ENSURE ENLIVE) LIQD Take 237 mLs by mouth 2 (two) times daily between meals. 07/24/18  Yes Mercy Riding, MD  furosemide (LASIX) 20 MG tablet TAKE (1) TABLET DAILY AS NEEDED FOR SWELLING. Patient taking differently: Take 20 mg by mouth daily as needed (swelling).  06/19/16  Yes Sherren Mocha, MD  ipratropium (ATROVENT) 0.03 % nasal spray Place 2 sprays into both nostrils 2 (two) times daily.   Yes [provider]  levothyroxine (SYNTHROID) 125 MCG tablet Take 1 tablet (125 mcg total) by mouth daily before breakfast. 09/02/15  Yes Hosie Poisson, MD  lidocaine (LIDODERM) 5 % Place 1 patch onto the skin daily as needed (pain). Remove & Discard patch within 12 hours or as directed by MD   Yes [provider]  linaclotide (LINZESS) 145 MCG CAPS capsule Take 145 mcg by mouth daily before breakfast.   Yes [provider]    mexiletine (MEXITIL) 150 MG capsule Take 1 capsule (150 mg total) by mouth 2 (two) times a day. 07/07/18  Yes Cheryln Manly, NP  nitroGLYCERIN (NITROSTAT) 0.4 MG SL tablet Place 1 tablet (0.4 mg total) under the tongue every 5 (five) minutes x 3 doses as needed for chest pain (Max 3 doses in 15 min. Call 911). 12/06/16  Yes Sherren Mocha, MD  omeprazole (PRILOSEC) 20 MG capsule Take 20 mg by mouth daily as needed (acid reflux).   Yes [provider]  polyethylene glycol (MIRALAX / GLYCOLAX) packet Take 17 g by mouth daily as needed for mild constipation.    Yes [provider]  Probiotic Product (PROBIOTIC DAILY PO) Take 1 capsule by mouth daily.   Yes [provider]  RESTASIS 0.05 % ophthalmic emulsion Place 1 drop into both eyes 2 (two) times daily as needed (dry eyes).  12/05/11  Yes [provider]  traMADol (ULTRAM) 50 MG tablet Take 50 mg by mouth every 6 (six) hours as needed for moderate pain.   Yes [provider]  warfarin (COUMADIN) 5 MG tablet Take 1-1.5 tablets (5-7.5 mg total) by mouth See admin instructions. Take 7.5 mg by mouth only on Fridays; all other days take 5mg  07/07/18  Yes Cheryln Manly, NP    Family History Family History  Problem Relation Age of Onset   Heart disease Mother 102   Diabetes Mother 71   Heart disease Father 13    Social History Social History   Tobacco Use   Smoking status: Former Smoker    Packs/day: 4.00    Years: 15.00    Pack years: 60.00    Types: Cigarettes    Last attempt to quit: 06/15/1969    Years since quitting: 49.1   Smokeless tobacco: Never Used  Substance Use Topics   Alcohol use: No   Drug use: No     Allergies   Penicillins; Tizanidine; Ace inhibitors; Antihistamines, diphenhydramine-type; Clemastine fumarate; Dimenhydrinate; and Amiodarone   Review of Systems Review of Systems  All systems reviewed and negative, other than as noted in HPI.  Physical  Exam Updated Vital Signs BP (!) 133/104 (BP Location: Right Arm)    Pulse 81    Temp 97.6 F (36.4 C) (Oral)    Resp (!) 24    Ht 6\' 2"  (1.88 m)    Wt 57.4 kg    SpO2 95%    BMI 16.24 kg/m   Physical Exam Vitals signs and nursing note reviewed.  Constitutional:      General: He is not in acute distress.    Appearance: He is well-developed.  HENT:     Head: Normocephalic and atraumatic.  Eyes:     General:        Right eye: No discharge.        Left eye: No discharge.     Conjunctiva/sclera: Conjunctivae normal.  Neck:     Musculoskeletal: Neck supple.  Cardiovascular:     Rate and Rhythm: Normal rate. Rhythm irregular.     Heart sounds: Murmur present. No friction rub. No gallop.   Pulmonary:     Effort: Pulmonary effort is normal. No respiratory distress.     Breath sounds: Wheezing present.  Abdominal:     General: There is no distension.     Palpations: Abdomen is soft.     Tenderness: There is no abdominal tenderness.  Musculoskeletal:        General: No tenderness.     Left lower leg: Edema present.  Skin:    General: Skin is warm and dry.  Neurological:     Mental Status: He is alert.  Psychiatric:        Behavior: Behavior normal.        Thought Content: Thought content normal.      ED Treatments / Results  Labs (all labs ordered are listed, but only abnormal results are displayed) Labs Reviewed  BASIC METABOLIC PANEL - Abnormal; Notable for the following components:      Result Value   Sodium 126 (*)    Chloride 91 (*)    Glucose, Bld 103 (*)    Calcium 8.7 (*)    All other components within normal limits  BRAIN NATRIURETIC  PEPTIDE - Abnormal; Notable for the following components:   B Natriuretic Peptide 904.0 (*)    All other components within normal limits  CBC WITH DIFFERENTIAL/PLATELET - Abnormal; Notable for the following components:   RBC 4.10 (*)    Hemoglobin 12.4 (*)    HCT 38.4 (*)    Neutro Abs 8.5 (*)    Lymphs Abs 0.5 (*)    All  other components within normal limits  PROTIME-INR - Abnormal; Notable for the following components:   Prothrombin Time 28.5 (*)    INR 2.7 (*)    All other components within normal limits  CBC - Abnormal; Notable for the following components:   WBC 13.5 (*)    RBC 3.51 (*)    Hemoglobin 10.7 (*)    HCT 31.8 (*)    All other components within normal limits  BASIC METABOLIC PANEL - Abnormal; Notable for the following components:   Sodium 124 (*)    Chloride 92 (*)    Glucose, Bld 136 (*)    Calcium 8.3 (*)    All other components within normal limits  PROTIME-INR - Abnormal; Notable for the following components:   Prothrombin Time 33.5 (*)    INR 3.4 (*)    All other components within normal limits  SARS CORONAVIRUS 2 (HOSPITAL ORDER, [place] [facility])  TROPONIN I    EKG EKG Interpretation  Date/Time:  Tuesday Aug 06 2018 09:58:04 EDT Ventricular Rate:  99 PR Interval:    QRS Duration: 128 QT Interval:  363 QTC Calculation: 466 R Axis:   104 Text Interpretation:  Atrial fibrillation Nonspecific intraventricular conduction delay Nonspecific repol abnormality, diffuse leads Confirmed by Virgel Manifold [phone]) on 08/06/2018 10:03:22 AM   Radiology Dg Chest Portable 1 View  Result Date: 08/06/2018 CLINICAL DATA:  Shortness of breath. EXAM: PORTABLE CHEST 1 VIEW COMPARISON:  Radiograph of Jul 22, 2018. FINDINGS: Stable cardiomediastinal silhouette. Sternotomy wires are noted. Left-sided pacemaker is unchanged in position. No pneumothorax is noted. Left lung is clear. New large airspace opacity is noted in the right lung concerning for pneumonia. Minimal right pleural effusion may be present. Bony thorax is unremarkable. IMPRESSION: Interval placement of large airspace opacity in right lung concerning for pneumonia. Followup PA and lateral chest X-ray is recommended in 3-4 weeks following trial of antibiotic therapy to ensure resolution and exclude underlying  malignancy. Electronically Signed   By: Marijo Conception M.D.   On: 08/06/2018 10:39    Procedures Procedures (including critical care time)  Medications Ordered in ED Medications  Ipratropium-Albuterol (COMBIVENT) respimat 2 puff (2 puffs Inhalation Given 08/06/18 1129)  ciprofloxacin (CIPRO) IVPB 400 mg (400 mg Intravenous Bolus from Bag 08/06/18 1124)  methylPREDNISolone sodium succinate (SOLU-MEDROL) 125 mg/2 mL injection 80 mg (80 mg Intravenous Given 08/06/18 1121)     Initial Impression / Assessment and Plan / ED Course  I have reviewed the triage vital signs and the nursing notes.  Pertinent labs & imaging results that were available during my care of the patient were reviewed by me and considered in my medical decision making (see chart for details).       86ym with dyspnea. Likely aspirated. CXR with R sided infiltrate. Likely pneumonitis, but will cover with abx for the time being. Has PCN allergy. Wheezing on exam. Cipro ordered in addition to bronchodilators and steroids. With o2 requirement, will admit.   Final Clinical Impressions(s) / ED Diagnoses   Final diagnoses:  Aspiration pneumonia  of right lung, unspecified aspiration pneumonia type, unspecified part of lung [facility])    ED Discharge Orders    None       Virgel Manifold, MD 08/07/18 1258

## 2018-08-06 NOTE — H&P (Addendum)
History and Physical    [name]:[id number] DOB: 1932-06-19 DOA: 08/06/2018  Referring MD/NP/PA: Mila Merry PCP: [name], MD  Patient coming from: Home via EMS  Chief Complaint: water went down the wrong pipe I have personally briefly reviewed patient's old medical records in [place]   HPI: [name] is a 85 y.o. male with medical history significant of CAD s/p CABG in 1979 with redo 1991, permanent atrial fibrillation on Coumadin, CVA, dysphagia, prostate cancer s/p radiation, BPH, and hyponatremia; who presented after he was sitting at home when he swallowed a bunch of water and went down the wrong way.  Thereafter, reported coughing and acute shortness of breath.  He has had issues with swallowing ever since his previous history of stroke.  Patient's wife states that he just was recently evaluated and had a modified barium swallow study in February.  Wife reports that he is on a regular diet and has been told what techniques to do multiple times, but does not always follow them.  Denies having any fever, chills, nausea, vomiting, diarrhea, nausea, and vomiting.  He chronically has urinary frequency and urgency with urination and he relates with history of prostate radiation.  He also reports that the left leg is always a little swollen due to veins being removed, but reports that it is at baseline.  Review of records notes that patient was just recently hospitalized for congestive heart failure exacerbation from 5/11-5/13.  ED Course: On admission to the emergency department patient was noted to be afebrile, pulse 73-1 08, respiration 14-25, O2 saturations 93 to 100% on 2 L of oxygen.  Labs revealed WBC 9.6, hemoglobin 12.4, sodium 126, and BNP 904.  COVID screening test negative.  Chest x-ray showing interval placement of a large airspace opacity in the right lung concerning for pneumonia.  Patient had been given 80 mg of Lasix IV, ciprofloxacin, and a breathing  treatment.  Review of Systems  Constitutional: Negative for chills and fever.  Respiratory: Positive for cough, shortness of breath and wheezing.   Cardiovascular: Positive for leg swelling.  Genitourinary: Positive for frequency and urgency.  Musculoskeletal: Negative for falls.  Neurological: Negative for focal weakness and loss of consciousness.  All other systems reviewed and are negative.   Past Medical History:  Diagnosis Date  . Aortic stenosis, severe    WITH PERCUTANEOUS AORTIC VALVE (TAVI) IN March 2012 at the [facility]  . Aspiration into airway   . Cervical myelopathy ([place])   . Chronic anticoagulation    on coumadin  . Chronic systolic CHF (congestive heart failure) ([place])   . Coronary artery disease    a.  s/p CABG 1979 and redo 1991.  Marland Kitchen Coughing   . Diverticulitis    CURRENTLY CONTROLLED WITH NO EVIDENCE OF RECURRENT INFECTION  . Esophageal stricture    WITH DILATATION  . Frailty   . GERD (gastroesophageal reflux disease)   . Heart murmur   . Hyperlipidemia   . Hyponatremia   . Ischemic cardiomyopathy 05/2010   Has EF of 25%  . MI (myocardial infarction) ([place]) 1974, 1978  . Osteoarthritis    RIGHT KNEE  . Osteoporosis   . Permanent atrial fibrillation    Has not tolerated amiodarone in the past. Amiodarone was stopped in September of 2010 due to side effects.  . Prostate cancer ([place])   . Pulmonary embolism ([place])   . Stroke [facility])    3 strokes  . VT (ventricular tachycardia) (  [facility])     Past Surgical History:  Procedure Laterality Date  . AORTIC VALVE REPLACEMENT     Percutaneous AVR in March 2012 at the [facility]  . BACK SURGERY    . CARDIAC CATHETERIZATION  2010   SEVERE LV DYSFUNCTION WITH ESTIMATED EJECTION FRACTION OF 25%  . CARDIOVERSION  02/16/2011   Procedure: CARDIOVERSION;  Surgeon: Carlena Bjornstad, MD;  Location: [place];  Service: Cardiovascular;  Laterality: N/A;  . CHOLECYSTECTOMY    . CORONARY ARTERY BYPASS GRAFT  1979  .  CORONARY ARTERY BYPASS GRAFT  1991   REDO SURGERY  . ICD  Feb 2003; 02/2013   gen change 02-11-2013 by Dr Caryl Comes  . IMPLANTABLE CARDIOVERTER DEFIBRILLATOR (ICD) GENERATOR CHANGE N/A 02/10/2013   Procedure: ICD GENERATOR CHANGE;  Surgeon: Deboraha Sprang, MD;  Location: [facility] CATH LAB;  Service: Cardiovascular;  Laterality: N/A;  . SHOULDER SURGERY       reports that he quit smoking about 49 years ago. His smoking use included cigarettes. He has a 60.00 pack-year smoking history. He has never used smokeless tobacco. He reports that he does not drink alcohol or use drugs.  Allergies  Allergen Reactions  . Penicillins Shortness Of Breath and Rash    Has patient had a PCN reaction causing immediate rash, facial/tongue/throat swelling, SOB or lightheadedness with hypotension: Yes Has patient had a PCN reaction causing severe rash involving mucus membranes or skin necrosis: No Has patient had a PCN reaction that required hospitalization No Has patient had a PCN reaction occurring within the last 10 years: No If all of the above answers are "NO", then may proceed with Cephalosporin use.   . Tizanidine Shortness Of Breath    Light headed  . Ace Inhibitors     Has not tolerated in the past due to hyperkalemia  . Antihistamines, Diphenhydramine-Type Other (See Comments)    Inhibits urination  . Clemastine Fumarate Other (See Comments)    Inhibits urination  . Dimenhydrinate Other (See Comments)    Inhibits urination  . Amiodarone Other (See Comments)    Unknown     Family History  Problem Relation Age of Onset  . Heart disease Mother 90  . Diabetes Mother 90  . Heart disease Father 50    Prior to Admission medications   Medication Sig Start Date End Date Taking? Authorizing Provider  alfuzosin (UROXATRAL) 10 MG 24 hr tablet Take 10 mg by mouth at bedtime.    Yes [provider]  aspirin 81 MG tablet Take 81 mg by mouth daily.   Yes [provider]  benzonatate (TESSALON)  100 MG capsule Take 100 mg by mouth 3 (three) times daily as needed for cough.   Yes [provider]  Budesonide-Formoterol Fumarate (SYMBICORT IN) Inhale 1 puff into the lungs 2 (two) times daily.   Yes [provider]  carvedilol (COREG) 6.25 MG tablet TAKE 1 TABLET BY MOUTH TWICE DAILY WITH A MEAL. Patient taking differently: Take 6.25 mg by mouth 2 (two) times daily with a meal.  07/01/18  Yes Sherren Mocha, MD  cholecalciferol (VITAMIN D) 1000 UNITS tablet Take 1,000 Units by mouth 2 (two) times daily.   Yes [provider]  famotidine (PEPCID) 40 MG tablet Take 40 mg by mouth daily.   Yes [provider]  feeding supplement, ENSURE ENLIVE, (ENSURE ENLIVE) LIQD Take 237 mLs by mouth 2 (two) times daily between meals. 07/24/18  Yes Mercy Riding, MD  furosemide (LASIX)  20 MG tablet TAKE (1) TABLET DAILY AS NEEDED FOR SWELLING. Patient taking differently: Take 20 mg by mouth daily as needed (swelling).  06/19/16  Yes Sherren Mocha, MD  ipratropium (ATROVENT) 0.03 % nasal spray Place 2 sprays into both nostrils 2 (two) times daily.   Yes [provider]  levothyroxine (SYNTHROID) 125 MCG tablet Take 1 tablet (125 mcg total) by mouth daily before breakfast. 09/02/15  Yes Hosie Poisson, MD  lidocaine (LIDODERM) 5 % Place 1 patch onto the skin daily as needed (pain). Remove & Discard patch within 12 hours or as directed by MD   Yes [provider]  linaclotide (LINZESS) 145 MCG CAPS capsule Take 145 mcg by mouth daily before breakfast.   Yes [provider]  mexiletine (MEXITIL) 150 MG capsule Take 1 capsule (150 mg total) by mouth 2 (two) times a day. 07/07/18  Yes Cheryln Manly, NP  nitroGLYCERIN (NITROSTAT) 0.4 MG SL tablet Place 1 tablet (0.4 mg total) under the tongue every 5 (five) minutes x 3 doses as needed for chest pain (Max 3 doses in 15 min. Call 911). 12/06/16  Yes Sherren Mocha, MD  omeprazole (PRILOSEC) 20 MG capsule Take  20 mg by mouth daily as needed (acid reflux).   Yes [provider]  polyethylene glycol (MIRALAX / GLYCOLAX) packet Take 17 g by mouth daily as needed for mild constipation.    Yes [provider]  Probiotic Product (PROBIOTIC DAILY PO) Take 1 capsule by mouth daily.   Yes [provider]  RESTASIS 0.05 % ophthalmic emulsion Place 1 drop into both eyes 2 (two) times daily as needed (dry eyes).  12/05/11  Yes [provider]  traMADol (ULTRAM) 50 MG tablet Take 50 mg by mouth every 6 (six) hours as needed for moderate pain.   Yes [provider]  warfarin (COUMADIN) 5 MG tablet Take 1-1.5 tablets (5-7.5 mg total) by mouth See admin instructions. Take 7.5 mg by mouth only on Fridays; all other days take 5mg  07/07/18  Yes Cheryln Manly, NP    Physical Exam:  Constitutional: Frail elderly male in no acute distress at this time Vitals:   08/06/18 1002 08/06/18 1130 08/06/18 1145 08/06/18 1306  BP: (!) 133/104 117/67 106/82 110/66  Pulse: 81 (!) 108 99 90  Resp: (!) 24 17 (!) 21 (!) 22  Temp: 97.6 F (36.4 C)     TempSrc: Oral     SpO2: 95% 99% 98% 100%  Weight: 57.4 kg     Height: 6\' 2"  (1.88 m)      Eyes: PERRL, lids and conjunctivae normal ENMT: Mucous membranes are moist. Posterior pharynx clear of any exudate or lesions.  Neck: normal, supple, no masses, no thyromegaly Respiratory: Mildly tachypneic with some mild wheezes noted on the right lung field.  Patient on 2 L nasal cannula oxygen.   Cardiovascular: Irregular irregular, no murmurs / rubs / gallops.  Trace left lower extremity edema.  Extremity edema. 2+ pedal pulses. No carotid bruits.  Abdomen: no tenderness, no masses palpated. No hepatosplenomegaly. Bowel sounds positive.  Musculoskeletal: no clubbing / cyanosis. No joint deformity upper and lower extremities. Good ROM, no contractures. Normal muscle tone.  Skin: no rashes, lesions, ulcers. No induration Neurologic: CN 2-12  grossly intact. Sensation intact, DTR normal. Strength 4+/5 in all 4.  Psychiatric: Normal judgment and insight. Alert and oriented x 3. Normal mood.     Labs on Admission: I have personally reviewed following labs and  imaging studies  CBC: Recent Labs  Lab 08/06/18 1008  WBC 9.6  NEUTROABS 8.5*  HGB 12.4*  HCT 38.4*  MCV 93.7  PLT 416   Basic Metabolic Panel: Recent Labs  Lab 08/06/18 1008  NA 126*  K 4.5  CL 91*  CO2 24  GLUCOSE 103*  BUN 10  CREATININE 0.96  CALCIUM 8.7*   GFR: Estimated Creatinine Clearance: 46.5 mL/min (by C-G formula based on SCr of 0.96 mg/dL). Liver Function Tests: No results for input(s): AST, ALT, ALKPHOS, BILITOT, PROT, ALBUMIN in the last 168 hours. No results for input(s): LIPASE, AMYLASE in the last 168 hours. No results for input(s): AMMONIA in the last 168 hours. Coagulation Profile: Recent Labs  Lab 08/02/18  INR 2.6   Cardiac Enzymes: Recent Labs  Lab 08/06/18 1008  TROPONINI <0.03   BNP (last 3 results) No results for input(s): PROBNP in the last 8760 hours. HbA1C: No results for input(s): HGBA1C in the last 72 hours. CBG: No results for input(s): GLUCAP in the last 168 hours. Lipid Profile: No results for input(s): CHOL, HDL, LDLCALC, TRIG, CHOLHDL, LDLDIRECT in the last 72 hours. Thyroid Function Tests: No results for input(s): TSH, T4TOTAL, FREET4, T3FREE, THYROIDAB in the last 72 hours. Anemia Panel: No results for input(s): VITAMINB12, FOLATE, FERRITIN, TIBC, IRON, RETICCTPCT in the last 72 hours. Urine analysis:    Component Value Date/Time   COLORURINE YELLOW 07/23/2018 0249   APPEARANCEUR CLEAR 07/23/2018 0249   LABSPEC 1.010 07/23/2018 0249   LABSPEC 1.010 11/16/2006 1032   PHURINE 6.5 07/23/2018 0249   GLUCOSEU NEGATIVE 07/23/2018 0249   HGBUR NEGATIVE 07/23/2018 0249   BILIRUBINUR NEGATIVE 07/23/2018 0249   BILIRUBINUR Negative 11/16/2006 1032   KETONESUR 15 (A) 07/23/2018 0249   PROTEINUR NEGATIVE  07/23/2018 0249   UROBILINOGEN 0.2 11/25/2014 0510   NITRITE NEGATIVE 07/23/2018 0249   LEUKOCYTESUR NEGATIVE 07/23/2018 0249   LEUKOCYTESUR Trace 11/16/2006 1032   Sepsis Labs: Recent Results (from the past 240 hour(s))  SARS Coronavirus 2 (CEPHEID- Performed in [place] hospital lab), Hosp Order     Status: None   Collection Time: 08/06/18 10:41 AM  Result Value Ref Range Status   SARS Coronavirus 2 NEGATIVE NEGATIVE Final    Comment: (NOTE) If result is NEGATIVE SARS-CoV-2 target nucleic acids are NOT DETECTED. The SARS-CoV-2 RNA is generally detectable in upper and lower  respiratory specimens during the acute phase of infection. The lowest  concentration of SARS-CoV-2 viral copies this assay can detect is 250  copies / mL. A negative result does not preclude SARS-CoV-2 infection  and should not be used as the sole basis for treatment or other  patient management decisions.  A negative result may occur with  improper specimen collection / handling, submission of specimen other  than nasopharyngeal swab, presence of viral mutation(s) within the  areas targeted by this assay, and inadequate number of viral copies  (<250 copies / mL). A negative result must be combined with clinical  observations, patient history, and epidemiological information. If result is POSITIVE SARS-CoV-2 target nucleic acids are DETECTED. The SARS-CoV-2 RNA is generally detectable in upper and lower  respiratory specimens dur ing the acute phase of infection.  Positive  results are indicative of active infection with SARS-CoV-2.  Clinical  correlation with patient history and other diagnostic information is  necessary to determine patient infection status.  Positive results do  not rule out bacterial infection or co-infection with other viruses. If result is PRESUMPTIVE POSTIVE  SARS-CoV-2 nucleic acids MAY BE PRESENT.   A presumptive positive result was obtained on the submitted specimen  and  confirmed on repeat testing.  While 2019 novel coronavirus  (SARS-CoV-2) nucleic acids may be present in the submitted sample  additional confirmatory testing may be necessary for epidemiological  and / or clinical management purposes  to differentiate between  SARS-CoV-2 and other Sarbecovirus currently known to infect humans.  If clinically indicated additional testing with an alternate test  methodology [phone]) is advised. The SARS-CoV-2 RNA is generally  detectable in upper and lower respiratory sp ecimens during the acute  phase of infection. The expected result is Negative. Fact Sheet for Patients:  StrictlyIdeas.no Fact Sheet for Healthcare Providers: BankingDealers.co.za This test is not yet approved or cleared by the [place] FDA and has been authorized for detection and/or diagnosis of SARS-CoV-2 by FDA under an Emergency Use Authorization (EUA).  This EUA will remain in effect (meaning this test can be used) for the duration of the COVID-19 declaration under Section 564(b)(1) of the Act, 21 U.S.C. section 360bbb-3(b)(1), unless the authorization is terminated or revoked sooner. Performed at [name], [place] [address] [place], [place] [address]      Radiological Exams on Admission: Dg Chest Portable 1 View  Result Date: 08/06/2018 CLINICAL DATA:  Shortness of breath. EXAM: PORTABLE CHEST 1 VIEW COMPARISON:  Radiograph of Jul 22, 2018. FINDINGS: Stable cardiomediastinal silhouette. Sternotomy wires are noted. Left-sided pacemaker is unchanged in position. No pneumothorax is noted. Left lung is clear. New large airspace opacity is noted in the right lung concerning for pneumonia. Minimal right pleural effusion may be present. Bony thorax is unremarkable. IMPRESSION: Interval placement of large airspace opacity in right lung concerning for pneumonia. Followup PA and lateral chest X-ray is recommended in 3-4 weeks  following trial of antibiotic therapy to ensure resolution and exclude underlying malignancy. Electronically Signed   By: Marijo Conception M.D.   On: 08/06/2018 10:39    EKG: Independently reviewed.  Atrial fibrillation 99 bpm  Assessment/Plan Acute respiratory failure secondary with hypoxia secondary to aspiration pneumonia: Acute.  Patient presents from home after aspirating water with acute shortness of breath.  Currently on 2 L of nasal cannula needed to maintain O2 saturations.  Not normally on oxygen.  Chest x-ray showing right sided pneumonia.  Due to multiple comorbidities he was in primarily started on ciprofloxacin. -Admit to a telemetry bed -Continuous pulse oximetry with nasal cannula oxygen -Aspiration precaution -Elevate head of bed -Continue ciprofloxacin IV  -Breathing treatments every 6 hours -Incentive spirometry  History of CVA, dysphasia: Patient has history of esophageal stricture, but had modified barium swallow on 2/27. He was recommended regular diet at that time with thin liquids and precautions.  Nursing staff noticed patient having difficult time and possibly aspirating. -Pured diet for now -Speech therapy to eval -Medications in applesauce  COPD, with small exacerbation: Patient appears to possibly have exacerbation brought on by aspiration event.  Patient had been given Solu-Medrol 80 mg IV in the ED.   -Combivent inhaler q. 6hr -Substitution of Breo inhaler    Chronic atrial fibrillation on chronic anticoagulation: Patient appears currently rate controlled and INR therapeutic at 2.7 -Continue Coreg  And mexiletine -Coumadin per pharmacy  Systolic CHF: BNP elevated up to 904( previously 793.6 on 5/11).  Patient appears to be on Lasix as needed.  -Give furosemide 20 mg p.o. x 1 dose -Determine if patient needs further Lasix in a.m.  Hyponatremia: Chronic.  Sodium 126 which appears near his baseline. -Continue to monitor  History of ventricular  tachyarrhythmia s/p AICD   Hypothyroidism: Last TSH 3.768 on 5/12. -Continue levothyroxine  DVT prophylaxis: Coumadin Code Status: Full Family Communication: Discussed plan of care with the patient's wife over the phone. Disposition Plan: Likely discharge home in a.m. if medically stable Consults called: None Admission status: Observation  Norval Morton MD [facility] Pager [phone]   If 7PM-7AM, please contact night-coverage [email or web address] Password [email or web address]  08/06/2018, 1:20 PM

## 2018-08-06 NOTE — Telephone Encounter (Signed)
  Daughter would like to speak to Truitt Merle to give some urgent information regarding his care.

## 2018-08-06 NOTE — ED Notes (Signed)
Called and talk to wife, [name].  Gave her an update on pt. Wife requested me to call her back before I leave at 1500

## 2018-08-06 NOTE — Telephone Encounter (Signed)
I have spoken to [name]'s daughter [name] this morning. She and her siblings have multiple concerns regarding his [name]. [name] has had several admissions over the past month for aspiration.   Last discharge summary noted that they had refused SNF placement. There has been discussion about feeding tube.   [name] notes that she and her siblings are not able to actively participate in [name]'s [name] due to their mother refusing to let them participate. [name] has tried to continue to provide all of his [name] that is needed. She remains very overwhelmed but not agreeable to letting others help. She has asked out possible psyche referral or seeing a counselor.  She has told her kids that she does not start giving him food until around 10:30 AM. They have heard their father say that he is "starving" - then eats too fast or takes too big of a bite - then gets choked - then [name] gets frustrated with him. Kids note that [name] is not providing the snacks (due to "crumbs" that would have to be cleaned up) nor is she able to provide the food as scheduled. She has been trying to serve him meals on fine [place] and with candles. [name] does not let any of their hired help actually provide any help. [name] is still getting up multiple times during the night - then sleeps in. She has been heard telling [name] "you do not need to go the bathroom".   The kids are in quite a dilemma as to what to do. There are concerns about their mom's mental health - if she should see psyche, etc.   Advised for them to reach out to [name] to make sure he is aware Have also advised them to reach out to the nursing staff as well and voice their concerns. Some type of intervention is certainly needed. They understand that probably one of their parents will need to be "removed" from the situation in order for the hired help to actually provide the [name] that Al[place] needs at this time. I have given her the name of [name] as well for psychiatry.   I will plan to  try and have a visit with [name] later this week as well.  Very difficult and challenging situation unfortunately. [name] was very appreciative of my call.   Jeffrey Junes, RN, [name] [address] [place], [place]  [phone]

## 2018-08-06 NOTE — Progress Notes (Signed)
ANTICOAGULATION CONSULT NOTE - Initial Consult  Pharmacy Consult for warfarin Indication: atrial fibrillation  Allergies  Allergen Reactions  . Penicillins Shortness Of Breath and Rash    Has patient had a PCN reaction causing immediate rash, facial/tongue/throat swelling, SOB or lightheadedness with hypotension: Yes Has patient had a PCN reaction causing severe rash involving mucus membranes or skin necrosis: No Has patient had a PCN reaction that required hospitalization No Has patient had a PCN reaction occurring within the last 10 years: No If all of the above answers are "NO", then may proceed with Cephalosporin use.   . Tizanidine Shortness Of Breath    Light headed  . Ace Inhibitors     Has not tolerated in the past due to hyperkalemia  . Antihistamines, Diphenhydramine-Type Other (See Comments)    Inhibits urination  . Clemastine Fumarate Other (See Comments)    Inhibits urination  . Dimenhydrinate Other (See Comments)    Inhibits urination  . Amiodarone Other (See Comments)    Unknown     Patient Measurements: Height: 6\' 2"  (188 cm) Weight: 126 lb 8 oz (57.4 kg) IBW/kg (Calculated) : 82.2  Vital Signs: Temp: 97.5 F (36.4 C) (05/26 1517) Temp Source: Axillary (05/26 1517) BP: 107/71 (05/26 1517) Pulse Rate: 97 (05/26 1517)  Labs: Recent Labs    08/06/18 1008  HGB 12.4*  HCT 38.4*  PLT 274  CREATININE 0.96  TROPONINI <0.03    Estimated Creatinine Clearance: 46.5 mL/min (by C-G formula based on SCr of 0.96 mg/dL).   Medical History: Past Medical History:  Diagnosis Date  . Aortic stenosis, severe    WITH PERCUTANEOUS AORTIC VALVE (TAVI) IN March 2012 at the [facility]  . Aspiration into airway   . Cervical myelopathy ([place])   . Chronic anticoagulation    on coumadin  . Chronic systolic CHF (congestive heart failure) ([place])   . Coronary artery disease    a.  s/p CABG 1979 and redo 1991.  Marland Kitchen Coughing   . Diverticulitis    CURRENTLY  CONTROLLED WITH NO EVIDENCE OF RECURRENT INFECTION  . Esophageal stricture    WITH DILATATION  . Frailty   . GERD (gastroesophageal reflux disease)   . Heart murmur   . Hyperlipidemia   . Hyponatremia   . Ischemic cardiomyopathy 05/2010   Has EF of 25%  . MI (myocardial infarction) ([place]) 1974, 1978  . Osteoarthritis    RIGHT KNEE  . Osteoporosis   . Permanent atrial fibrillation    Has not tolerated amiodarone in the past. Amiodarone was stopped in September of 2010 due to side effects.  . Presence of cardioverter defibrillator   . Prostate cancer ([place])   . Pulmonary embolism ([place])   . Stroke [facility])    3 strokes  . VT (ventricular tachycardia) ([facility])      Assessment: 68 yoM on warfarin PTA for hx AFib and stroke admitted with hypoxia. Pharmacy asked to resume warfarin. INR on admit at goal at 2/7, last dose of warfarin was 5/25. Noted DDI with ciprofloxacin started today, pt may require slightly less warfarin weekly moving forward.  *Home Warfarin Dose = 7.5mg  Friday, 5mg  all other days  Goal of Therapy:  INR 2.5-3 Monitor platelets by anticoagulation protocol: Yes   Plan:  -Warfarin 5mg  PO x1 tonight -Daily protime   Arrie Senate, PharmD, BCPS Clinical Pharmacist [phone] Please check AMION for all [place] numbers 08/06/2018

## 2018-08-06 NOTE — ED Notes (Signed)
Attempted to call report to [phone] 

## 2018-08-06 NOTE — ED Notes (Signed)
Called wife back to inform of pt. Going to 3 EAST Bed 8. Told wife she can call up to the unit and ask to speak to nurse to be informed of her husband's condition.

## 2018-08-07 ENCOUNTER — Observation Stay (HOSPITAL_COMMUNITY): Payer: Medicare Other

## 2018-08-07 DIAGNOSIS — I255 Ischemic cardiomyopathy: Secondary | ICD-10-CM | POA: Diagnosis present

## 2018-08-07 DIAGNOSIS — R350 Frequency of micturition: Secondary | ICD-10-CM | POA: Diagnosis present

## 2018-08-07 DIAGNOSIS — R3915 Urgency of urination: Secondary | ICD-10-CM | POA: Diagnosis present

## 2018-08-07 DIAGNOSIS — J690 Pneumonitis due to inhalation of food and vomit: Secondary | ICD-10-CM | POA: Diagnosis present

## 2018-08-07 DIAGNOSIS — J9601 Acute respiratory failure with hypoxia: Secondary | ICD-10-CM | POA: Diagnosis present

## 2018-08-07 DIAGNOSIS — R1313 Dysphagia, pharyngeal phase: Secondary | ICD-10-CM | POA: Diagnosis present

## 2018-08-07 DIAGNOSIS — Z880 Allergy status to penicillin: Secondary | ICD-10-CM | POA: Diagnosis not present

## 2018-08-07 DIAGNOSIS — Z923 Personal history of irradiation: Secondary | ICD-10-CM | POA: Diagnosis not present

## 2018-08-07 DIAGNOSIS — I5022 Chronic systolic (congestive) heart failure: Secondary | ICD-10-CM | POA: Diagnosis present

## 2018-08-07 DIAGNOSIS — J441 Chronic obstructive pulmonary disease with (acute) exacerbation: Secondary | ICD-10-CM | POA: Diagnosis present

## 2018-08-07 DIAGNOSIS — I4821 Permanent atrial fibrillation: Secondary | ICD-10-CM | POA: Diagnosis present

## 2018-08-07 DIAGNOSIS — N401 Enlarged prostate with lower urinary tract symptoms: Secondary | ICD-10-CM | POA: Diagnosis present

## 2018-08-07 DIAGNOSIS — Z8546 Personal history of malignant neoplasm of prostate: Secondary | ICD-10-CM | POA: Diagnosis not present

## 2018-08-07 DIAGNOSIS — I2510 Atherosclerotic heart disease of native coronary artery without angina pectoris: Secondary | ICD-10-CM | POA: Diagnosis present

## 2018-08-07 DIAGNOSIS — E871 Hypo-osmolality and hyponatremia: Secondary | ICD-10-CM | POA: Diagnosis not present

## 2018-08-07 DIAGNOSIS — Z95810 Presence of automatic (implantable) cardiac defibrillator: Secondary | ICD-10-CM | POA: Diagnosis not present

## 2018-08-07 DIAGNOSIS — I69391 Dysphagia following cerebral infarction: Secondary | ICD-10-CM | POA: Diagnosis not present

## 2018-08-07 DIAGNOSIS — Z20828 Contact with and (suspected) exposure to other viral communicable diseases: Secondary | ICD-10-CM | POA: Diagnosis present

## 2018-08-07 DIAGNOSIS — K219 Gastro-esophageal reflux disease without esophagitis: Secondary | ICD-10-CM | POA: Diagnosis present

## 2018-08-07 DIAGNOSIS — Z888 Allergy status to other drugs, medicaments and biological substances status: Secondary | ICD-10-CM | POA: Diagnosis not present

## 2018-08-07 DIAGNOSIS — E039 Hypothyroidism, unspecified: Secondary | ICD-10-CM | POA: Diagnosis present

## 2018-08-07 DIAGNOSIS — I482 Chronic atrial fibrillation, unspecified: Secondary | ICD-10-CM | POA: Diagnosis not present

## 2018-08-07 DIAGNOSIS — Z7901 Long term (current) use of anticoagulants: Secondary | ICD-10-CM | POA: Diagnosis not present

## 2018-08-07 DIAGNOSIS — Z951 Presence of aortocoronary bypass graft: Secondary | ICD-10-CM | POA: Diagnosis not present

## 2018-08-07 DIAGNOSIS — E038 Other specified hypothyroidism: Secondary | ICD-10-CM | POA: Diagnosis not present

## 2018-08-07 DIAGNOSIS — F0390 Unspecified dementia without behavioral disturbance: Secondary | ICD-10-CM | POA: Diagnosis present

## 2018-08-07 LAB — CBC
HCT: 31.8 % — ABNORMAL LOW (ref 39.0–52.0)
Hemoglobin: 10.7 g/dL — ABNORMAL LOW (ref 13.0–17.0)
MCH: 30.5 pg (ref 26.0–34.0)
MCHC: 33.6 g/dL (ref 30.0–36.0)
MCV: 90.6 fL (ref 80.0–100.0)
Platelets: 217 10*3/uL (ref 150–400)
RBC: 3.51 MIL/uL — ABNORMAL LOW (ref 4.22–5.81)
RDW: 13.5 % (ref 11.5–15.5)
WBC: 13.5 10*3/uL — ABNORMAL HIGH (ref 4.0–10.5)
nRBC: 0 % (ref 0.0–0.2)

## 2018-08-07 LAB — PROTIME-INR
INR: 3.4 — ABNORMAL HIGH (ref 0.8–1.2)
Prothrombin Time: 33.5 seconds — ABNORMAL HIGH (ref 11.4–15.2)

## 2018-08-07 LAB — BASIC METABOLIC PANEL
Anion gap: 8 (ref 5–15)
BUN: 14 mg/dL (ref 8–23)
CO2: 24 mmol/L (ref 22–32)
Calcium: 8.3 mg/dL — ABNORMAL LOW (ref 8.9–10.3)
Chloride: 92 mmol/L — ABNORMAL LOW (ref 98–111)
Creatinine, Ser: 0.95 mg/dL (ref 0.61–1.24)
GFR calc Af Amer: >60 mL/min (ref >60)
GFR calc non Af Amer: >60 mL/min (ref >60)
Glucose, Bld: 136 mg/dL — ABNORMAL HIGH (ref 70–99)
Potassium: 4.4 mmol/L (ref 3.5–5.1)
Sodium: 124 mmol/L — ABNORMAL LOW (ref 135–145)

## 2018-08-07 LAB — OSMOLALITY, URINE: Osmolality, Ur: 609 mOsm/kg (ref 300–900)

## 2018-08-07 MED ORDER — WARFARIN SODIUM 1 MG PO TABS
1.0000 mg | ORAL_TABLET | Freq: Once | ORAL | Status: AC
  Administered 2018-08-07: 1 mg via ORAL
  Filled 2018-08-07: qty 1

## 2018-08-07 MED ORDER — ALBUTEROL SULFATE (2.5 MG/3ML) 0.083% IN NEBU
2.5000 mg | INHALATION_SOLUTION | Freq: Three times a day (TID) | RESPIRATORY_TRACT | Status: DC
  Administered 2018-08-07 – 2018-08-09 (×7): 2.5 mg via RESPIRATORY_TRACT
  Filled 2018-08-07 (×7): qty 3

## 2018-08-07 NOTE — Progress Notes (Signed)
[place] for warfarin Indication: atrial fibrillation  Allergies  Allergen Reactions  . Penicillins Shortness Of Breath and Rash    Has patient had a PCN reaction causing immediate rash, facial/tongue/throat swelling, SOB or lightheadedness with hypotension: Yes Has patient had a PCN reaction causing severe rash involving mucus membranes or skin necrosis: No Has patient had a PCN reaction that required hospitalization No Has patient had a PCN reaction occurring within the last 10 years: No If all of the above answers are "NO", then may proceed with Cephalosporin use.   . Tizanidine Shortness Of Breath    Light headed  . Ace Inhibitors     Has not tolerated in the past due to hyperkalemia  . Antihistamines, Diphenhydramine-Type Other (See Comments)    Inhibits urination  . Clemastine Fumarate Other (See Comments)    Inhibits urination  . Dimenhydrinate Other (See Comments)    Inhibits urination  . Amiodarone Other (See Comments)    Unknown    Labs: Recent Labs    08/06/18 1008 08/06/18 1525 08/07/18 0426  HGB 12.4*  --  10.7*  HCT 38.4*  --  31.8*  PLT 274  --  217  LABPROT  --  28.5* 33.5*  INR  --  2.7* 3.4*  CREATININE 0.96  --  0.95  TROPONINI <0.03  --   --     Estimated Creatinine Clearance: 61.4 mL/min (by C-G formula based on SCr of 0.95 mg/dL).   Assessment: 62 yoM on warfarin PTA for hx AFib and stroke admitted with hypoxia. Pharmacy asked to resume warfarin. INR on admit at goal at 2/7, last dose of warfarin was 5/25. Noted DDI with ciprofloxacin started today, pt may require slightly less warfarin weekly moving forward.  INR today elevated at 3.4  *Home Warfarin Dose = 7.5mg  Friday, 5mg  all other days  Goal of Therapy:  INR 2.5-3 Monitor platelets by anticoagulation protocol: Yes   Plan:  -Warfarin 1 mg PO x1 tonight -Daily protime  Thank you Anette Guarneri, PharmD [phone] Please check AMION for all [facility] numbers 08/07/2018

## 2018-08-07 NOTE — Progress Notes (Signed)
MD text paged regarding son wanting medical update about patient.

## 2018-08-07 NOTE — Evaluation (Signed)
Physical Therapy Evaluation Patient Details Name: [name] MRN: [id number] DOB: 01/24/1933 Today's Date: 08/07/2018   History of Present Illness  Pt is an 85 y.o. male admitted on 08/06/2018 with right lower lobe PNA. Pt. Pt also with poor appetite, dysphagia with aspiration events. PMH includes CAD s/p CABG, afib, CHF, anemia, R knee OA, strokes, PE.  Clinical Impression  Patient presents with decreased endurance. He was SOB with gait and transfers. He required min a for bed mobility. He has aides at home to assist. He may benefit most from rehab at a SNF to improve his endurance. He may be able to progress his mobility to the point where he can go home with home health.     Follow Up Recommendations SNF;Home health PT;Supervision/Assistance - 24 hour    Equipment Recommendations   None      Recommendations for Other Services       Precautions / Restrictions Precautions Precautions: Fall Restrictions Weight Bearing Restrictions: No      Mobility  Bed Mobility Overal bed mobility: Needs Assistance Bed Mobility: Supine to Sit;Sit to Supine     Supine to sit: Min assist Sit to supine: Min assist   General bed mobility comments: Min a to sit up at the edge of the bed. Min a to get legs back in .   Transfers Overall transfer level: Needs assistance Equipment used: Rolling walker (2 wheeled)   Sit to Stand: Min guard         General transfer comment: min guard for safety and initial balance  Ambulation/Gait Ambulation/Gait assistance: Min guard Gait Distance (Feet): 20 Feet Assistive device: Rolling walker (2 wheeled) Gait Pattern/deviations: Step-through pattern;Decreased stride length;Trunk flexed Gait velocity: Decreased   General Gait Details: Patient gait distance was limited. He was SOB. He likley could have walked further but he reports the mask makes him more SOB. He likely could not have gone much further though. He was very SOB.   Stairs             Wheelchair Mobility    Modified Rankin (Stroke Patients Only)       Balance Overall balance assessment: Needs assistance Sitting-balance support: No upper extremity supported Sitting balance-Leahy Scale: Good     Standing balance support: Bilateral upper extremity supported Standing balance-Leahy Scale: Poor Standing balance comment: Reliant on UE support                             Pertinent Vitals/Pain Pain Assessment: No/denies pain Pain Score: 0-No pain    Home Living Family/patient expects to be discharged to:: Private residence Living Arrangements: Spouse/significant other Available Help at Discharge: Family;Available 24 hours/day Type of Home: House Home Access: Stairs to enter Entrance Stairs-Rails: Right Entrance Stairs-Number of Steps: 4 Home Layout: Two level;Able to live on main level with bedroom/bathroom Home Equipment: Gilford Rile - 2 wheels;Walker - 4 wheels;Bedside commode;Grab bars - tub/shower;Grab bars - toilet;Shower seat Additional Comments: Has lived on first floor of home for years    Prior Function Level of Independence: Needs assistance;Independent with assistive device(s)   Gait / Transfers Assistance Needed: Mod indep with rollator  ADL's / Homemaking Assistance Needed: Reports indep with ADLs; wife assists with IADLs        Hand Dominance        Extremity/Trunk Assessment   Upper Extremity Assessment Upper Extremity Assessment: Overall WFL for tasks assessed    Lower Extremity Assessment Lower Extremity  Assessment: Generalized weakness    Cervical / Trunk Assessment Cervical / Trunk Assessment: Kyphotic  Communication   Communication: HOH  Cognition Arousal/Alertness: Awake/alert Behavior During Therapy: WFL for tasks assessed/performed Overall Cognitive Status: Within Functional Limits for tasks assessed                                 General Comments: At times seemed confused. Patient wanted his  trash can moved several times.       General Comments      Exercises     Assessment/Plan    PT Assessment Patient needs continued PT services  PT Problem List Decreased strength;Decreased activity tolerance;Decreased balance;Decreased mobility;Decreased safety awareness       PT Treatment Interventions DME instruction;Gait training;Stair training;Functional mobility training;Therapeutic activities;Therapeutic exercise;Balance training;Patient/family education    PT Goals (Current goals can be found in the Care Plan section)  Acute Rehab PT Goals Patient Stated Goal: Return home PT Goal Formulation: With patient Time For Goal Achievement: 08/06/18 Potential to Achieve Goals: Good    Frequency Min 3X/week   Barriers to discharge        Co-evaluation               AM-PAC PT "6 Clicks" Mobility  Outcome Measure Help needed turning from your back to your side while in a flat bed without using bedrails?: A Little Help needed moving from lying on your back to sitting on the side of a flat bed without using bedrails?: A Little Help needed moving to and from a bed to a chair (including a wheelchair)?: A Little Help needed standing up from a chair using your arms (e.g., wheelchair or bedside chair)?: A Little Help needed to walk in hospital room?: A Little Help needed climbing 3-5 steps with a railing? : A Lot 6 Click Score: 17    End of Session Equipment Utilized During Treatment: Gait belt Activity Tolerance: Patient tolerated treatment well Patient left: in bed;with call bell/phone within reach;with bed alarm set Nurse Communication: Mobility status PT Visit Diagnosis: Other abnormalities of gait and mobility (R26.89);Muscle weakness (generalized) (M62.81)    Time: 2060-1561 PT Time Calculation (min) (ACUTE ONLY): 20 min   Charges:   PT Evaluation $PT Eval Moderate Complexity: 1 Mod           Carney Living PT DPT  08/07/2018, 3:49 PM

## 2018-08-07 NOTE — Progress Notes (Addendum)
PROGRESS NOTE    Jeffrey ROSENOW  [name]:[id number] DOB: 1932-03-18 DOA: 08/06/2018 PCP: [name], MD   Brief Narrative:  Per admitting physician:  [name] is a 85 y.o. male with medical history significant of CAD s/p CABG in 1979 with redo 1991, permanent atrial fibrillation on Coumadin, CVA, dysphagia, prostate cancer s/p radiation, BPH, and hyponatremia; who presented after he was sitting at home when he swallowed a bunch of water and went down the wrong way.  Thereafter, reported coughing and acute shortness of breath.  He has had issues with swallowing ever since his previous history of stroke.  Patient's wife states that he just was recently evaluated and had a modified barium swallow study in February.  Wife reports that he is on a regular diet and has been told what techniques to do multiple times, but does not always follow them.  Denies having any fever, chills, nausea, vomiting, diarrhea, nausea, and vomiting.  He chronically has urinary frequency and urgency with urination and he relates with history of prostate radiation.  He also reports that the left leg is always a little swollen due to veins being removed, but reports that it is at baseline.  Review of records notes that patient was just recently hospitalized for congestive heart failure exacerbation from 5/11-5/13.  ED Course: On admission to the emergency department patient was noted to be afebrile, pulse 73-1 08, respiration 14-25, O2 saturations 93 to 100% on 2 L of oxygen.  Labs revealed WBC 9.6, hemoglobin 12.4, sodium 126, and BNP 904.  COVID screening test negative.  Chest x-ray showing interval placement of a large airspace opacity in the right lung concerning for pneumonia.  Patient had been given 80 mg of Lasix IV, ciprofloxacin, and a breathing treatment.  Assessment & Plan:   Active Problems:   Atrial fibrillation, chronic   Anticoagulated on Coumadin   Automatic implantable cardioverter-defibrillator in situ  Hyponatremia   Hypothyroidism   Acute respiratory failure (HCC)   Aspiration pneumonia [facility])   Hospital course Acute respiratory failure second hypoxia secondary to aspiration pneumonia acute Patient with history of aspiration, Speech eval with plan modified barium swallow  Continue supplemental O2 2 L-patient normally on room air at home Aspiration precautions Elevate head of bed Continue IV antibiotics Nebulizers every 6 hours  History of CVA with dysphagia Diet recommendations per speech pured for now As noted modified barium swallow tomorrow Continue medications with applesauce  COPD with mild exacerbation Patient received 80 mg of Solu-Medrol in the ED No wheezing on my evaluation today he was not in exacerbation Continue nebs Supplemental O2 maintain sats in the low 90s  A. fib on chronic anticoagulation Continue rate control medications Coumadin dosing by pharmacy  Chronic hyponatremia. Will check serum osmole's and urine osmoles Patient reportedly is at his baseline No neurological deficits Potassium is normal  Chronic systolic CHF. BNP mildly elevated but patient does not appear to be volume overloaded He was given 1 dose of p.o. Lasix of 20 mg ported PRN Lasix at home Added strict I's and O's as well as daily weights Cautious fluid administration  Debility with presumed dementia: Discussed the case with son who feels mother is unable to care for patient at home Place PT and OT consults, family considering skilled nursing facility placement  History of V. Tach   Hypothyroidism TSH on May 12 was 3.768 Continue current dosing   DVT prophylaxis: WJ:[id number]  Code Status: Full    Code Status Orders  (  From admission, onward)         Start     Ordered   08/06/18 1500  Full code  Continuous     08/06/18 1509        Code Status History    Date Active Date Inactive Code Status Order ID Comments User Context   07/23/2018 0055 07/24/2018 1726 Full  Code [id number]  Rise Patience, MD Inpatient   07/06/2018 1825 07/07/2018 1419 Full Code [id number]  Eppie Gibson Inpatient   01/21/2017 0052 01/22/2017 2203 Full Code [id number]  Vilma Prader, MD Inpatient   09/01/2015 1950 09/02/2015 2106 Full Code [id number]  Vianne Bulls, MD ED   01/16/2014 1703 01/20/2014 1350 Full Code [id number]  Kinnie Feil, MD Inpatient   02/10/2013 1020 02/10/2013 1336 Full Code [id number]  Deboraha Sprang, MD Inpatient   05/30/2012 [phone] 06/02/2012 1744 Full Code [id number]  [name], MD Inpatient    Advance Directive Documentation     Most Recent Value  Type of Advance Directive  Healthcare Power of Attorney, Living will  Pre-existing out of facility DNR order (yellow form or pink MOST form)  --  "MOST" Form in Place?  --     Family Communication: spoke with son Nada Boozer  Disposition Plan:   Patient changed to inpatient with severity of illness requiring frequent nurse intervention, IV antibiotics, frequent respiratory intervention for aspiration pneumonia with hypoxic respiratory failure.  Without these treatments patient would be at risk of worsening respiratory status with respiratory failure and possible death Consults called: None Admission status: Inpatient   Consultants:   None  Procedures:  Ct Head Wo Contrast  Result Date: 07/23/2018 CLINICAL DATA:  85 year old male with a history of altered mental status EXAM: CT HEAD WITHOUT CONTRAST TECHNIQUE: Contiguous axial images were obtained from the base of the skull through the vertex without intravenous contrast. COMPARISON:  CT 02/07/2016 FINDINGS: Brain: No acute intracranial hemorrhage. No midline shift or mass effect. Gray-white differentiation maintained. Confluent hypodensity in the bilateral periventricular white matter. Focal hypodensity in the left centrum semiovale, unchanged. Mild senescent volume loss. Unremarkable appearance of the ventricular system. Vascular: Intracranial  atherosclerosis. Skull: No acute fracture.  No aggressive bone lesion identified. Sinuses/Orbits: Unremarkable appearance of the orbits. Mastoid air cells clear. No middle ear effusion. No significant sinus disease. Other: None IMPRESSION: Negative for acute intracranial abnormality. Evidence of chronic microvascular ischemic disease. Associated intracranial atherosclerosis. Electronically Signed   By: Corrie Mckusick D.O.   On: 07/23/2018 08:30   Dg Chest Portable 1 View  Result Date: 08/06/2018 CLINICAL DATA:  Shortness of breath. EXAM: PORTABLE CHEST 1 VIEW COMPARISON:  Radiograph of Jul 22, 2018. FINDINGS: Stable cardiomediastinal silhouette. Sternotomy wires are noted. Left-sided pacemaker is unchanged in position. No pneumothorax is noted. Left lung is clear. New large airspace opacity is noted in the right lung concerning for pneumonia. Minimal right pleural effusion may be present. Bony thorax is unremarkable. IMPRESSION: Interval placement of large airspace opacity in right lung concerning for pneumonia. Followup PA and lateral chest X-ray is recommended in 3-4 weeks following trial of antibiotic therapy to ensure resolution and exclude underlying malignancy. Electronically Signed   By: Marijo Conception M.D.   On: 08/06/2018 10:39   Dg Chest Portable 1 View  Result Date: 07/22/2018 CLINICAL DATA:  Shortness of breath today. History of cardiomyopathy. EXAM: PORTABLE CHEST 1 VIEW COMPARISON:  Single-view of the chest 07/06/2018. PA and lateral chest 09/01/2015. CT chest 07/11/2015.  FINDINGS: There is cardiomegaly. The patient is status post aortic valve replacement and has an AICD in place. Atherosclerosis is noted. No pneumothorax or pleural effusion. Pulmonary vascular congestion is noted. No consolidative process is seen. No acute bony abnormality. IMPRESSION: Cardiomegaly and pulmonary vascular congestion. Electronically Signed   By: Inge Rise M.D.   On: 07/22/2018 17:28      Antimicrobials:   Cipro day 2   Subjective: Patient with poor insight, No acute decompensation.  Objective: Vitals:   08/07/18 0019 08/07/18 0106 08/07/18 0524 08/07/18 0847  BP: 94/70  (!) 98/55   Pulse: 87  74   Resp: 18  17   Temp: (!) 97.5 F (36.4 C)  (!) 97.3 F (36.3 C)   TempSrc: Oral  Oral   SpO2: 96% 98% 94% 98%  Weight:   75 kg   Height:        Intake/Output Summary (Last 24 hours) at 08/07/2018 1423 Last data filed at 08/07/2018 0900 Gross per 24 hour  Intake 297 ml  Output 200 ml  Net 97 ml   Filed Weights   08/06/18 1002 08/07/18 0524  Weight: 57.4 kg 75 kg    Examination:  General exam: Appears calm and comfortable, poor insight Respiratory system: Rhonchi bilaterally most prominent right side, no wheezing, no accessory muscle use Cardiovascular system: S1 & S2 heard, RRR. No JVD, murmurs, rubs, gallops or clicks. No pedal edema. Gastrointestinal system: Abdomen is nondistended, soft and nontender. No organomegaly or masses felt. Normal bowel sounds heard. Central nervous system: Alert and oriented. No focal neurological deficits. Extremities: Warm well perfused, no edema noted Skin: No rashes, lesions or ulcers Psychiatry: Judgement and insight are impaired with presumed cognitive deficits from dementia,  mood & affect able without any acute decompensation    Data Reviewed: I have personally reviewed following labs and imaging studies  CBC: Recent Labs  Lab 08/06/18 1008 08/07/18 0426  WBC 9.6 13.5*  NEUTROABS 8.5*  --   HGB 12.4* 10.7*  HCT 38.4* 31.8*  MCV 93.7 90.6  PLT 274 962   Basic Metabolic Panel: Recent Labs  Lab 08/06/18 1008 08/07/18 0426  NA 126* 124*  K 4.5 4.4  CL 91* 92*  CO2 24 24  GLUCOSE 103* 136*  BUN 10 14  CREATININE 0.96 0.95  CALCIUM 8.7* 8.3*   GFR: Estimated Creatinine Clearance: 61.4 mL/min (by C-G formula based on SCr of 0.95 mg/dL). Liver Function Tests: No results for input(s): AST, ALT,  ALKPHOS, BILITOT, PROT, ALBUMIN in the last 168 hours. No results for input(s): LIPASE, AMYLASE in the last 168 hours. No results for input(s): AMMONIA in the last 168 hours. Coagulation Profile: Recent Labs  Lab 08/02/18 08/06/18 1525 08/07/18 0426  INR 2.6 2.7* 3.4*   Cardiac Enzymes: Recent Labs  Lab 08/06/18 1008  TROPONINI <0.03   BNP (last 3 results) No results for input(s): PROBNP in the last 8760 hours. HbA1C: No results for input(s): HGBA1C in the last 72 hours. CBG: No results for input(s): GLUCAP in the last 168 hours. Lipid Profile: No results for input(s): CHOL, HDL, LDLCALC, TRIG, CHOLHDL, LDLDIRECT in the last 72 hours. Thyroid Function Tests: No results for input(s): TSH, T4TOTAL, FREET4, T3FREE, THYROIDAB in the last 72 hours. Anemia Panel: No results for input(s): VITAMINB12, FOLATE, FERRITIN, TIBC, IRON, RETICCTPCT in the last 72 hours. Sepsis Labs: No results for input(s): PROCALCITON, LATICACIDVEN in the last 168 hours.  Recent Results (from the past 240 hour(s))  SARS Coronavirus  2 (CEPHEID- Performed in [place] lab), Hosp Order     Status: None   Collection Time: 08/06/18 10:41 AM  Result Value Ref Range Status   SARS Coronavirus 2 NEGATIVE NEGATIVE Final    Comment: (NOTE) If result is NEGATIVE SARS-CoV-2 target nucleic acids are NOT DETECTED. The SARS-CoV-2 RNA is generally detectable in upper and lower  respiratory specimens during the acute phase of infection. The lowest  concentration of SARS-CoV-2 viral copies this assay can detect is 250  copies / mL. A negative result does not preclude SARS-CoV-2 infection  and should not be used as the sole basis for treatment or other  patient management decisions.  A negative result may occur with  improper specimen collection / handling, submission of specimen other  than nasopharyngeal swab, presence of viral mutation(s) within the  areas targeted by this assay, and inadequate number of  viral copies  (<250 copies / mL). A negative result must be combined with clinical  observations, patient history, and epidemiological information. If result is POSITIVE SARS-CoV-2 target nucleic acids are DETECTED. The SARS-CoV-2 RNA is generally detectable in upper and lower  respiratory specimens dur ing the acute phase of infection.  Positive  results are indicative of active infection with SARS-CoV-2.  Clinical  correlation with patient history and other diagnostic information is  necessary to determine patient infection status.  Positive results do  not rule out bacterial infection or co-infection with other viruses. If result is PRESUMPTIVE POSTIVE SARS-CoV-2 nucleic acids MAY BE PRESENT.   A presumptive positive result was obtained on the submitted specimen  and confirmed on repeat testing.  While 2019 novel coronavirus  (SARS-CoV-2) nucleic acids may be present in the submitted sample  additional confirmatory testing may be necessary for epidemiological  and / or clinical management purposes  to differentiate between  SARS-CoV-2 and other Sarbecovirus currently known to infect humans.  If clinically indicated additional testing with an alternate test  methodology [phone]) is advised. The SARS-CoV-2 RNA is generally  detectable in upper and lower respiratory sp ecimens during the acute  phase of infection. The expected result is Negative. Fact Sheet for Patients:  StrictlyIdeas.no Fact Sheet for Healthcare Providers: BankingDealers.co.za This test is not yet approved or cleared by the [place] FDA and has been authorized for detection and/or diagnosis of SARS-CoV-2 by FDA under an Emergency Use Authorization (EUA).  This EUA will remain in effect (meaning this test can be used) for the duration of the COVID-19 declaration under Section 564(b)(1) of the Act, 21 U.S.C. section 360bbb-3(b)(1), unless the authorization is  terminated or revoked sooner. Performed at [facility], [place] [address] [place], [place] [address]          Radiology Studies: Dg Chest Portable 1 View  Result Date: 08/06/2018 CLINICAL DATA:  Shortness of breath. EXAM: PORTABLE CHEST 1 VIEW COMPARISON:  Radiograph of Jul 22, 2018. FINDINGS: Stable cardiomediastinal silhouette. Sternotomy wires are noted. Left-sided pacemaker is unchanged in position. No pneumothorax is noted. Left lung is clear. New large airspace opacity is noted in the right lung concerning for pneumonia. Minimal right pleural effusion may be present. Bony thorax is unremarkable. IMPRESSION: Interval placement of large airspace opacity in right lung concerning for pneumonia. Followup PA and lateral chest X-ray is recommended in 3-4 weeks following trial of antibiotic therapy to ensure resolution and exclude underlying malignancy. Electronically Signed   By: Marijo Conception M.D.   On: 08/06/2018 10:39  Scheduled Meds:  albuterol  2.5 mg Nebulization TID   albuterol  5 mg Nebulization Once   alfuzosin  10 mg Oral QHS   aspirin EC  81 mg Oral Daily   carvedilol  6.25 mg Oral BID WC   famotidine  40 mg Oral Daily   feeding supplement (ENSURE ENLIVE)  237 mL Oral BID BM   fluticasone furoate-vilanterol  1 puff Inhalation Daily   levothyroxine  125 mcg Oral QAC breakfast   linaclotide  145 mcg Oral QAC breakfast   mexiletine  150 mg Oral BID   warfarin  1 mg Oral ONCE-1800   Warfarin - Pharmacist Dosing Inpatient   Does not apply q1800   Continuous Infusions:  ciprofloxacin 400 mg (08/07/18 0847)     LOS: 0 days    Time spent: 59 min     Nicolette Bang, MD [facility]  If 7PM-7AM, please contact night-coverage  08/07/2018, 2:23 PM

## 2018-08-07 NOTE — Evaluation (Signed)
Clinical/Bedside Swallow Evaluation Patient Details  Name: [name] MRN: [id number] Date of Birth: 03/17/1932  Today's Date: 08/07/2018 Time: SLP Start Time (ACUTE ONLY): 78 SLP Stop Time (ACUTE ONLY): 1120 SLP Time Calculation (min) (ACUTE ONLY): 15 min  Past Medical History:  Past Medical History:  Diagnosis Date  . Aortic stenosis, severe    WITH PERCUTANEOUS AORTIC VALVE (TAVI) IN March 2012 at the [facility]  . Aspiration into airway   . Cervical myelopathy ([place])   . Chronic anticoagulation    on coumadin  . Chronic systolic CHF (congestive heart failure) ([place])   . Coronary artery disease    a.  s/p CABG 1979 and redo 1991.  Marland Kitchen Coughing   . Diverticulitis    CURRENTLY CONTROLLED WITH NO EVIDENCE OF RECURRENT INFECTION  . Esophageal stricture    WITH DILATATION  . Frailty   . GERD (gastroesophageal reflux disease)   . Heart murmur   . Hyperlipidemia   . Hyponatremia   . Ischemic cardiomyopathy 05/2010   Has EF of 25%  . MI (myocardial infarction) ([place]) 1974, 1978  . Osteoarthritis    RIGHT KNEE  . Osteoporosis   . Permanent atrial fibrillation    Has not tolerated amiodarone in the past. Amiodarone was stopped in September of 2010 due to side effects.  . Presence of cardioverter defibrillator   . Prostate cancer ([place])   . Pulmonary embolism ([place])   . Stroke [facility])    3 strokes  . VT (ventricular tachycardia) ([place])    Past Surgical History:  Past Surgical History:  Procedure Laterality Date  . AORTIC VALVE REPLACEMENT     Percutaneous AVR in March 2012 at the [facility]  . BACK SURGERY    . CARDIAC CATHETERIZATION  2010   SEVERE LV DYSFUNCTION WITH ESTIMATED EJECTION FRACTION OF 25%  . CARDIOVERSION  02/16/2011   Procedure: CARDIOVERSION;  Surgeon: Carlena Bjornstad, MD;  Location: [facility];  Service: Cardiovascular;  Laterality: N/A;  . CHOLECYSTECTOMY    . CORONARY ARTERY BYPASS GRAFT  1979  . CORONARY ARTERY BYPASS GRAFT  1991   REDO SURGERY  .  ICD  Feb 2003; 02/2013   gen change 02-11-2013 by Dr Caryl Comes  . IMPLANTABLE CARDIOVERTER DEFIBRILLATOR (ICD) GENERATOR CHANGE N/A 02/10/2013   Procedure: ICD GENERATOR CHANGE;  Surgeon: Deboraha Sprang, MD;  Location: [facility] CATH LAB;  Service: Cardiovascular;  Laterality: N/A;  . SHOULDER SURGERY     HPI:  Patient is an 85 y.o. male with PMH: CAD s/p CABG in 1979, atrial fibrillation, CVA, dysphagia, prostate cancer s/p radiation, BPH and hyponatremia, who presented to hospital after incident at home during which he swallowed a lot of water and started coughing and was SOB. MBS that was completed on 05/09/18 recommended Regular solids, thin liquids with aspiration precautions (chin tuck to clear residuals, eating solids and liquids separately). CXR revealed large airspace opacity in right lung concerning for PNA.    Assessment / Plan / Recommendation Clinical Impression  Patient presents with mild oropharyngeal dysphagia without overt aspiration but with patient requiring more effortful swallows with purees and regular solids. Patient was at this hospital [facility]) two weeks ago with simliar symptoms as this current visit. Plan to perform MBS this PM to compare to previous one that was completed in 05/09/18 and to determine any change in swallow function.  SLP Visit Diagnosis: Dysphagia, oropharyngeal phase (R13.12)    Aspiration Risk  Mild aspiration risk    Diet Recommendation  Thin liquid;Dysphagia 1 (Puree)   Liquid Administration via: Cup;Straw Medication Administration: Crushed with puree Supervision: Patient able to self feed Compensations: Slow rate;Small sips/bites;Clear throat intermittently    Other  Recommendations Oral Care Recommendations: Oral care BID   Follow up Recommendations        Frequency and Duration min 1 x/week  1 week       Prognosis Prognosis for Safe Diet Advancement: Fair Barriers to Reach Goals: Severity of deficits;Time post onset      Swallow Study   General  Date of Onset: 08/06/18 HPI: Patient is an 85 y.o. male with PMH: CAD s/p CABG in 1979, atrial fibrillation, CVA, dysphagia, prostate cancer s/p radiation, BPH and hyponatremia, who presented to hospital after incident at home during which he swallowed a lot of water and started coughing and was SOB. MBS that was completed on 05/09/18 recommended Regular solids, thin liquids with aspiration precautions (chin tuck to clear residuals, eating solids and liquids separately). CXR revealed large airspace opacity in right lung concerning for PNA.  Type of Study: Bedside Swallow Evaluation Previous Swallow Assessment: MBS in February, BSE two weeks ago Diet Prior to this Study: Dysphagia 1 (puree);Thin liquids Temperature Spikes Noted: No Respiratory Status: Room air History of Recent Intubation: No Behavior/Cognition: Alert;Cooperative;Pleasant mood Oral Cavity Assessment: Within Functional Limits Oral Care Completed by SLP: No Oral Cavity - Dentition: Adequate natural dentition Vision: Functional for self-feeding Patient Positioning: Other (comment)(sitting edge of bed) Baseline Vocal Quality: Hoarse Volitional Cough: Strong Volitional Swallow: Able to elicit    Oral/Motor/Sensory Function Overall Oral Motor/Sensory Function: Within functional limits   Ice Chips     Thin Liquid Thin Liquid: Within functional limits    Nectar Thick Nectar Thick Liquid: Not tested   Honey Thick     Puree Puree: Impaired Pharyngeal Phase Impairments: Other (comments)(appeared to have to put more effort into swallow )   Solid     Solid: Impaired Other Comments: patient required more effortful swallow before saying "it went down"      [place], [place] 08/07/2018,12:22 PM  Sonia Baller, MA, CCC-SLP Speech Therapy [facility] Acute Rehab Pager: [phone]

## 2018-08-07 NOTE — Telephone Encounter (Signed)
Thanks Cecille Rubin. I know Dr Virgina Jock has been working closely with Jenny Reichmann too.

## 2018-08-07 NOTE — Progress Notes (Signed)
Modified Barium Swallow Progress Note  Patient Details  Name: [name] MRN: [id number] Date of Birth: 01-Mar-1933  Today's Date: 08/07/2018  Modified Barium Swallow completed.  Full report located under Chart Review in the Imaging Section.  Brief recommendations include the following:  Clinical Impression  Patient exhibits a moderate pharyngeal dysphagia characterized by decreased laryngeal elevation, decreaesed pharyngeal contraction and significantly restricted UES opening, leading to vallecular and pyriform sinus residuals and slow transit of thin liquids and puree solids boluses through UES. Patient had sensed aspiration before the swallow when taking barium tablet with liquids. Barium tablet became briefly lodged at level of vallecular sinus, and then pyriform sinus, before transiting through UES. Patient exhibited silent pentration during the swallow with thin liquids, occuring mainly when vallecular and pyriform sinuses were full. Chin tuck posture did help to clear some of pharyngeal residuals, but patient had to put a lot of effort into performing dry swallows after PO's, and full clearance of pharynx was never achieved. As compared to MBS on 05/09/18, there is not much that has changed other than a slight decrease in UES opening. In addition, 2014 MBS report describes many of the same swallow function deficits that were noted in today's MBS. Plan is to speak with patient and MD regarding swallow safety and determining how to maximize safety but also nutrition and pleasure with PO intake. Aside from patient education, direct intervention is not likely to improve his swallow function, as it has been chronic since 2014.    Swallow Evaluation Recommendations       SLP Diet Recommendations: Thin liquid;Dysphagia 1 (Puree) solids   Liquid Administration via: Cup   Medication Administration: Crushed with puree   Supervision: Patient able to self feed;Intermittent supervision to cue for  compensatory strategies   Compensations: Slow rate;Small sips/bites;Clear throat intermittently   Postural Changes: Remain semi-upright after after feeds/meals (Comment);Seated upright at 90 degrees   Oral Care Recommendations: Oral care BID        [name], [name] 08/07/2018,4:43 PM   Sonia Baller, MA, CCC-SLP Speech Therapy [facility] Acute Rehab Pager: [phone]

## 2018-08-07 NOTE — Progress Notes (Signed)
Pt called wife while this RN in room with him, she is aware the doctor has not rounded to see him yet, after he rounds will have an update of plan to give her.

## 2018-08-08 LAB — CBC WITH DIFFERENTIAL/PLATELET
Abs Immature Granulocytes: 0 10*3/uL (ref 0.00–0.07)
Basophils Absolute: 0 10*3/uL (ref 0.0–0.1)
Basophils Relative: 0 %
Eosinophils Absolute: 0.1 10*3/uL (ref 0.0–0.5)
Eosinophils Relative: 1 %
HCT: 31.2 % — ABNORMAL LOW (ref 39.0–52.0)
Hemoglobin: 10.7 g/dL — ABNORMAL LOW (ref 13.0–17.0)
Lymphocytes Relative: 5 %
Lymphs Abs: 0.7 10*3/uL (ref 0.7–4.0)
MCH: 30.4 pg (ref 26.0–34.0)
MCHC: 34.3 g/dL (ref 30.0–36.0)
MCV: 88.6 fL (ref 80.0–100.0)
Monocytes Absolute: 0 10*3/uL — ABNORMAL LOW (ref 0.1–1.0)
Monocytes Relative: 0 %
Neutro Abs: 12.2 10*3/uL — ABNORMAL HIGH (ref 1.7–7.7)
Neutrophils Relative %: 94 %
Platelets: 229 10*3/uL (ref 150–400)
RBC: 3.52 MIL/uL — ABNORMAL LOW (ref 4.22–5.81)
RDW: 13.4 % (ref 11.5–15.5)
WBC: 13 10*3/uL — ABNORMAL HIGH (ref 4.0–10.5)
nRBC: 0 % (ref 0.0–0.2)
nRBC: 0 /100 WBC

## 2018-08-08 LAB — BASIC METABOLIC PANEL
Anion gap: 9 (ref 5–15)
BUN: 20 mg/dL (ref 8–23)
CO2: 22 mmol/L (ref 22–32)
Calcium: 8.6 mg/dL — ABNORMAL LOW (ref 8.9–10.3)
Chloride: 93 mmol/L — ABNORMAL LOW (ref 98–111)
Creatinine, Ser: 0.95 mg/dL (ref 0.61–1.24)
GFR calc Af Amer: >60 mL/min (ref >60)
GFR calc non Af Amer: >60 mL/min (ref >60)
Glucose, Bld: 102 mg/dL — ABNORMAL HIGH (ref 70–99)
Potassium: 4.6 mmol/L (ref 3.5–5.1)
Sodium: 124 mmol/L — ABNORMAL LOW (ref 135–145)

## 2018-08-08 LAB — OSMOLALITY: Osmolality: 260 mOsm/kg — ABNORMAL LOW (ref 275–295)

## 2018-08-08 LAB — PROTIME-INR
INR: 2.7 — ABNORMAL HIGH (ref 0.8–1.2)
Prothrombin Time: 28.5 seconds — ABNORMAL HIGH (ref 11.4–15.2)

## 2018-08-08 MED ORDER — WARFARIN SODIUM 2 MG PO TABS
4.0000 mg | ORAL_TABLET | Freq: Once | ORAL | Status: AC
  Administered 2018-08-08: 4 mg via ORAL
  Filled 2018-08-08: qty 2

## 2018-08-08 MED ORDER — SODIUM CHLORIDE 0.9 % IV SOLN
INTRAVENOUS | Status: DC | PRN
  Administered 2018-08-08: 250 mL via INTRAVENOUS

## 2018-08-08 MED ORDER — FUROSEMIDE 20 MG PO TABS
20.0000 mg | ORAL_TABLET | Freq: Once | ORAL | Status: AC
  Administered 2018-08-08: 20 mg via ORAL
  Filled 2018-08-08: qty 1

## 2018-08-08 MED ORDER — ENSURE ENLIVE PO LIQD
237.0000 mL | Freq: Three times a day (TID) | ORAL | Status: DC
  Administered 2018-08-08: 237 mL via ORAL

## 2018-08-08 MED ORDER — ADULT MULTIVITAMIN W/MINERALS CH
1.0000 | ORAL_TABLET | Freq: Every day | ORAL | Status: DC
  Administered 2018-08-09: 1 via ORAL
  Filled 2018-08-08: qty 1

## 2018-08-08 NOTE — Progress Notes (Signed)
[place] for warfarin Indication: atrial fibrillation  Allergies  Allergen Reactions  . Penicillins Shortness Of Breath and Rash    Has patient had a PCN reaction causing immediate rash, facial/tongue/throat swelling, SOB or lightheadedness with hypotension: Yes Has patient had a PCN reaction causing severe rash involving mucus membranes or skin necrosis: No Has patient had a PCN reaction that required hospitalization No Has patient had a PCN reaction occurring within the last 10 years: No If all of the above answers are "NO", then may proceed with Cephalosporin use.   . Tizanidine Shortness Of Breath    Light headed  . Ace Inhibitors     Has not tolerated in the past due to hyperkalemia  . Antihistamines, Diphenhydramine-Type Other (See Comments)    Inhibits urination  . Clemastine Fumarate Other (See Comments)    Inhibits urination  . Dimenhydrinate Other (See Comments)    Inhibits urination  . Amiodarone Other (See Comments)    Unknown    Labs: Recent Labs    08/06/18 1008 08/06/18 1525 08/07/18 0426 08/08/18 0420  HGB 12.4*  --  10.7* 10.7*  HCT 38.4*  --  31.8* 31.2*  PLT 274  --  217 229  LABPROT  --  28.5* 33.5* 28.5*  INR  --  2.7* 3.4* 2.7*  CREATININE 0.96  --  0.95 0.95  TROPONINI <0.03  --   --   --     Estimated Creatinine Clearance: 61.5 mL/min (by C-G formula based on SCr of 0.95 mg/dL).   Assessment: 20 yoM on warfarin PTA for hx AFib and stroke admitted with hypoxia. Pharmacy asked to resume warfarin. INR on admit at goal at 2/7, last dose of warfarin was 5/25. Noted DDI with ciprofloxacin started today, pt may require slightly less warfarin weekly moving forward.  INR today 2.7  *Home Warfarin Dose = 7.5mg  Friday, 5mg  all other days  Goal of Therapy:  INR 2.5-3 Monitor platelets by anticoagulation protocol: Yes   Plan:  -Warfarin 4 mg PO x 1 tonight -Daily protime  Thank you Anette Guarneri,  PharmD [phone] Please check AMION for all [facility] Pharmacy numbers 08/08/2018

## 2018-08-08 NOTE — Consult Note (Signed)
   [facility] CM Inpatient Consult   08/08/2018  [name] 1932-06-14 [id number]  Patient screened for extreme high risk score for unplanned readmission score with a 30 day hospitalizations.  Patient in the Medicare [place]. Dr. [name] listed as primary care provider.   Assessed to check if potential [place] [facility] for services.  Review of patient's medical record reveals from history and physical notes on 08/06/2018 as follows as the patient is [name] is a 85 y.o. male with medical history significant of CAD s/p CABG in 1979 with redo 1991, permanent atrial fibrillation on Coumadin, CVA, dysphagia, prostate cancer s/p radiation, BPH, and hyponatremia; who presented after he was sitting at home when he swallowed a bunch of water and went down the wrong way.  Thereafter, reported coughing and acute shortness of breath.  He has had issues with swallowing ever since his previous history of stroke.  Patient's wife states that he just was recently evaluated and had a modified barium swallow study in February.  Wife reports that he is on a regular diet and has been told what techniques to do multiple times, but does not always follow them.  Denies having any fever, chills, nausea, vomiting, diarrhea, nausea, and vomiting.  He chronically has urinary frequency and urgency with urination and he relates with history of prostate radiation.  Patient had an hospitalization 5/11/-07/24/2018 for HF exacerbation.   Plan:  Currently, being recommended for SNF, has aides at home for support per PT notes. Will follow up for progression and with inpatient [facility] team to check for [facility] Care Management needs/programs.    Please place a [facility] Care Management consult as appropriate and for questions contact:   [name], RN BSN [facility] Liaison  [phone] business mobile phone Toll free office [phone]  Fax number: [phone]  [email or web address]

## 2018-08-08 NOTE — Progress Notes (Signed)
PROGRESS NOTE    [name]  VFI:[id number] DOB: [birth year] DOA: 08/06/2018 PCP: [name], MD   Brief Narrative:  Jeffrey Tieu Hughesis a 85 y.o.malewith medical history significant ofCADs/p CABG in 1979 with redo 1991, permanent atrial fibrillation on Coumadin, CVA, dysphagia, prostate cancers/pradiation, BPH, and hyponatremia;who presented after he was sitting at home when he swallowed a bunch of water and went down the wrong way. Thereafter, reported coughing and acuteshortness of breath. He has had issues with swallowing ever since his previous history of stroke. Patient's wife states that he just was recently evaluated and had a modified barium swallow study inFebruary.Wife reports that heis on a regular diet andhas been told whattechniquesto do multiple times, but does not alwaysfollow them.Denies having any fever, chills, nausea, vomiting, diarrhea, nausea, andvomiting. He chronically has urinary frequency and urgency with urination and he relates with history of prostate radiation. He also reports that the left leg is always a littleswollen due to veins being removed, but reportsthat it is at baseline. Review of records notes that patient was just recently hospitalized for congestive heart failure exacerbation from 5/11-5/13.  ED Course:On admission to the emergency department patient was noted to be afebrile, pulse 73-1 08, respiration 14-25, O2 saturations 93 to 100% on 2 L of oxygen. Labs revealed WBC 9.6, hemoglobin 12.4, sodium 126, and BNP 904.COVID screening test negative. Chest x-ray showing interval placement of a large airspace opacity in the right lung concerning for pneumonia. Patient had been given 80 mg of Lasix IV, ciprofloxacin, and a breathing treatment.   Assessment & Plan:   Active Problems:   Atrial fibrillation, chronic   Anticoagulated on Coumadin   Automatic implantable cardioverter-defibrillator in situ   Hyponatremia  Hypothyroidism   Acute respiratory failure (HCC)   Aspiration pneumonia (HCC)   Acute respiratory failure second hypoxia secondary to aspiration pneumonia acute Patient with history of aspiration, Speech eval with modified barium swallow recommending TL dysphagia puree solids wean supplemental O2 2 L-patient normally on room air at home Aspiration precautions Elevate head of bed Continue IV antibioticsfor now, planned convert to po in am Nebulizers every 6 hours  History of CVA with dysphagia Diet recommendations per speech pured for now As noted modified barium swallow as above Continue medications with applesauce  COPD with mild exacerbation Patient received 80 mg of Solu-Medrol in the ED No wheezing on my evaluation today he was not in exacerbation Continue nebs without change Supplemental O2 maintain sats in the low 90s  A. fib on chronic anticoagulation Continue rate control medications Coumadin dosing by pharmacy  Chronic hyponatremia. Mild low osmols, watch volume status, diuresis as below one dose now-monitor overnight, check lytes in am Patient reportedly is at his baseline No neurological deficits Potassium is normal  Chronic systolic CHF. BNP mildly elevated but patient does not appear to be volume overloaded He was given 1 dose of p.o. Lasix of 20 mg ported PRN Lasix at home Added strict I's and O's as well as daily weights Cautious fluid administration  Debility with presumed dementia: Discussed the case with son who feels mother is unable to care for patient at home Place PT and OT consults, family considering skilled nursing facility placement  History of V. Tach   Hypothyroidism TSH on May 12 was 3.768 Continue current dosing  DVT prophylaxis: coumadin Code Status: full    Code Status Orders  (From admission, onward)         Start     Ordered  08/06/18 1500  Full code  Continuous     08/06/18 1509        Code Status History     Date Active Date Inactive Code Status Order ID Comments User Context   07/23/2018 0055 07/24/2018 1726 Full Code [id number]  Rise Patience, MD Inpatient   07/06/2018 1825 07/07/2018 1419 Full Code [id number]  Eppie Gibson Inpatient   01/21/2017 0052 01/22/2017 2203 Full Code [id number]  Vilma Prader, MD Inpatient   09/01/2015 1950 09/02/2015 2106 Full Code [id number]  Vianne Bulls, MD ED   01/16/2014 1703 01/20/2014 1350 Full Code [id number]  Kinnie Feil, MD Inpatient   02/10/2013 1020 02/10/2013 1336 Full Code [id number]  Deboraha Sprang, MD Inpatient   05/30/2012 [phone] 06/02/2012 1744 Full Code [id number]  [name], MD Inpatient    Advance Directive Documentation     Most Recent Value  Type of Advance Directive  Healthcare Power of Attorney, Living will  Pre-existing out of facility DNR order (yellow form or pink MOST form)  --  "MOST" Form in Place?  --     Family Communication:  with patient's son and wife Disposition Plan:   Continued inpatient an additional day for diuresis and management of electrolytes as well as pulmonary support secondary to aspiration pneumonia. Consults called: None Admission status: Inpatient   Consultants:   None  Procedures:  Ct Head Wo Contrast  Result Date: 07/23/2018 CLINICAL DATA:  85 year old male with a history of altered mental status EXAM: CT HEAD WITHOUT CONTRAST TECHNIQUE: Contiguous axial images were obtained from the base of the skull through the vertex without intravenous contrast. COMPARISON:  CT 02/07/2016 FINDINGS: Brain: No acute intracranial hemorrhage. No midline shift or mass effect. Gray-white differentiation maintained. Confluent hypodensity in the bilateral periventricular white matter. Focal hypodensity in the left centrum semiovale, unchanged. Mild senescent volume loss. Unremarkable appearance of the ventricular system. Vascular: Intracranial atherosclerosis. Skull: No acute fracture.  No aggressive bone  lesion identified. Sinuses/Orbits: Unremarkable appearance of the orbits. Mastoid air cells clear. No middle ear effusion. No significant sinus disease. Other: None IMPRESSION: Negative for acute intracranial abnormality. Evidence of chronic microvascular ischemic disease. Associated intracranial atherosclerosis. Electronically Signed   By: Corrie Mckusick D.O.   On: 07/23/2018 08:30   Dg Chest Portable 1 View  Result Date: 08/06/2018 CLINICAL DATA:  Shortness of breath. EXAM: PORTABLE CHEST 1 VIEW COMPARISON:  Radiograph of Jul 22, 2018. FINDINGS: Stable cardiomediastinal silhouette. Sternotomy wires are noted. Left-sided pacemaker is unchanged in position. No pneumothorax is noted. Left lung is clear. New large airspace opacity is noted in the right lung concerning for pneumonia. Minimal right pleural effusion may be present. Bony thorax is unremarkable. IMPRESSION: Interval placement of large airspace opacity in right lung concerning for pneumonia. Followup PA and lateral chest X-ray is recommended in 3-4 weeks following trial of antibiotic therapy to ensure resolution and exclude underlying malignancy. Electronically Signed   By: Marijo Conception M.D.   On: 08/06/2018 10:39   Dg Chest Portable 1 View  Result Date: 07/22/2018 CLINICAL DATA:  Shortness of breath today. History of cardiomyopathy. EXAM: PORTABLE CHEST 1 VIEW COMPARISON:  Single-view of the chest 07/06/2018. PA and lateral chest 09/01/2015. CT chest 07/11/2015. FINDINGS: There is cardiomegaly. The patient is status post aortic valve replacement and has an AICD in place. Atherosclerosis is noted. No pneumothorax or pleural effusion. Pulmonary vascular congestion is noted. No consolidative process is seen. No acute bony abnormality. IMPRESSION:  Cardiomegaly and pulmonary vascular congestion. Electronically Signed   By: Inge Rise M.D.   On: 07/22/2018 17:28   Dg Swallowing Func-speech Pathology  Result Date: 08/07/2018 Objective  Swallowing Evaluation: Type of Study: MBS-Modified Barium Swallow Study  Patient Details Name: [name] MRN: [id number] Date of Birth: 12-Feb-1933 Today's Date: 08/07/2018 Time: SLP Start Time (ACUTE ONLY): 1330 -SLP Stop Time (ACUTE ONLY): 9163 SLP Time Calculation (min) (ACUTE ONLY): 25 min Past Medical History: Past Medical History: Diagnosis Date  Aortic stenosis, severe   WITH PERCUTANEOUS AORTIC VALVE (TAVI) IN March 2012 at the [facility]  Aspiration into airway   Cervical myelopathy (HCC)   Chronic anticoagulation   on coumadin  Chronic systolic CHF (congestive heart failure) ([name])   Coronary artery disease   a.  s/p CABG 1979 and redo 1991.  Coughing   Diverticulitis   CURRENTLY CONTROLLED WITH NO EVIDENCE OF RECURRENT INFECTION  Esophageal stricture   WITH DILATATION  Frailty   GERD (gastroesophageal reflux disease)   Heart murmur   Hyperlipidemia   Hyponatremia   Ischemic cardiomyopathy 05/2010  Has EF of 25%  MI (myocardial infarction) ([place]) 1974, 1978  Osteoarthritis   RIGHT KNEE  Osteoporosis   Permanent atrial fibrillation   Has not tolerated amiodarone in the past. Amiodarone was stopped in September of 2010 due to side effects.  Presence of cardioverter defibrillator   Prostate cancer [facility])   Pulmonary embolism ([place])   Stroke ([place])   3 strokes  VT (ventricular tachycardia) ([place])  Past Surgical History: Past Surgical History: Procedure Laterality Date  AORTIC VALVE REPLACEMENT    Percutaneous AVR in March 2012 at the [place]  2010  SEVERE LV DYSFUNCTION WITH ESTIMATED EJECTION FRACTION OF 25%  CARDIOVERSION  02/16/2011  Procedure: CARDIOVERSION;  Surgeon: Carlena Bjornstad, MD;  Location: [place];  Service: Cardiovascular;  Laterality: N/A;  [place]  ICD  Feb 2003; 02/2013  gen change 02-11-2013 by Dr Caryl Comes  IMPLANTABLE  CARDIOVERTER DEFIBRILLATOR (ICD) GENERATOR CHANGE N/A 02/10/2013  Procedure: ICD GENERATOR CHANGE;  Surgeon: Deboraha Sprang, MD;  [facility] C[facility] LAB;  Service: Cardiovascular;  Laterality: N/A;  SHOULDER SURGERY   HPI: Patient is an 85 y.o. male with PMH: CAD s/p CABG in 1979, atrial fibrillation, CVA, dysphagia, prostate cancer s/p radiation, BPH and hyponatremia, who presented to hospital after incident at home during which he swallowed a lot of water and started coughing and was SOB. MBS that was completed on 05/09/18 recommended Regular solids, thin liquids with aspiration precautions (chin tuck to clear residuals, eating solids and liquids separately). CXR revealed large airspace opacity in right lung concerning for PNA.  Subjective: pleasant, alert Assessment / Plan / Recommendation CHL IP CLINICAL IMPRESSIONS 08/07/2018 Clinical Impression Patient exhibits a moderate pharyngeal dysphagia characterized by decreased laryngeal elevation, decreaesed pharyngeal contraction and significantly restricted UES opening, leading to vallecular and pyriform sinus residuals and slow transit of thin liquids and puree solids boluses through UES. Patient had sensed aspiration before the swallow when taking barium tablet with liquids. Barium tablet became briefly lodged at level of vallecular sinus, and then pyriform sinus, before transiting through UES. Patient exhibited silent pentration during the swallow with thin liquids, occuring mainly when vallecular and pyriform sinuses were full. Chin tuck posture did help to clear some of pharyngeal residuals, but patient  had to put a lot of effort into performing dry swallows after PO's, and full clearance of pharynx was never achieved. No aspiration of vallecular or pyriform sinus residuals observed during this study, but very likely to occur during regular PO intake. As compared to MBS on 05/09/18, there is not much that has changed other than a slight decrease in UES opening. In  addition, 2014 MBS report describes many of the same swallow function deficits that were noted in today's MBS. Plan is to speak with patient and MD regarding swallow safety and determining how to maximize safety but also nutrition and pleasure with PO intake. Aside from patient education, direct intervention is not likely to improve his swallow function, as it has been chronic since 2014.  SLP Visit Diagnosis Dysphagia, pharyngeal phase (R13.13) Attention and concentration deficit following -- Frontal lobe and executive function deficit following -- Impact on safety and function Severe aspiration risk   CHL IP TREATMENT RECOMMENDATION 08/07/2018 Treatment Recommendations Therapy as outlined in treatment plan below   Prognosis 08/07/2018 Prognosis for Safe Diet Advancement Fair Barriers to Reach Goals Severity of deficits;Time post onset Barriers/Prognosis Comment -- CHL IP DIET RECOMMENDATION 08/07/2018 SLP Diet Recommendations Thin liquid;Dysphagia 1 (Puree) solids Liquid Administration via Cup Medication Administration Crushed with puree Compensations Slow rate;Small sips/bites;Clear throat intermittently Postural Changes Remain semi-upright after after feeds/meals (Comment);Seated upright at 90 degrees   CHL IP OTHER RECOMMENDATIONS 08/07/2018 Recommended Consults -- Oral Care Recommendations Oral care BID Other Recommendations --   CHL IP FOLLOW UP RECOMMENDATIONS 08/07/2018 Follow up Recommendations None   CHL IP FREQUENCY AND DURATION 08/07/2018 Speech Therapy Frequency (ACUTE ONLY) min 1 x/week Treatment Duration 1 week      CHL IP ORAL PHASE 08/07/2018 Oral Phase Impaired Oral - Pudding Teaspoon -- Oral - Pudding Cup -- Oral - Honey Teaspoon -- Oral - Honey Cup -- Oral - Nectar Teaspoon -- Oral - Nectar Cup -- Oral - Nectar Straw -- Oral - Thin Teaspoon -- Oral - Thin Cup -- Oral - Thin Straw -- Oral - Puree Weak lingual manipulation;Delayed oral transit;Reduced posterior propulsion Oral - Mech Soft -- Oral -  Regular -- Oral - Multi-Consistency -- Oral - Pill -- Oral Phase - Comment --  CHL IP PHARYNGEAL PHASE 08/07/2018 Pharyngeal Phase Impaired Pharyngeal- Pudding Teaspoon -- Pharyngeal -- Pharyngeal- Pudding Cup -- Pharyngeal -- Pharyngeal- Honey Teaspoon -- Pharyngeal -- Pharyngeal- Honey Cup -- Pharyngeal -- Pharyngeal- Nectar Teaspoon -- Pharyngeal -- Pharyngeal- Nectar Cup -- Pharyngeal -- Pharyngeal- Nectar Straw -- Pharyngeal -- Pharyngeal- Thin Teaspoon -- Pharyngeal -- Pharyngeal- Thin Cup Delayed swallow initiation-vallecula;Pharyngeal residue - valleculae;Pharyngeal residue - pyriform;Penetration/Aspiration before swallow;Penetration/Aspiration during swallow;Reduced tongue base retraction;Reduced epiglottic inversion;Reduced pharyngeal peristalsis;Reduced laryngeal elevation;Reduced anterior laryngeal mobility;Compensatory strategies attempted (with notebox) Pharyngeal Material enters airway, remains ABOVE vocal cords and not ejected out;Material enters airway, passes BELOW cords and not ejected out despite cough attempt by patient Pharyngeal- Thin Straw -- Pharyngeal -- Pharyngeal- Puree Delayed swallow initiation-vallecula;Pharyngeal residue - valleculae;Pharyngeal residue - pyriform;Compensatory strategies attempted (with notebox);Reduced pharyngeal peristalsis;Reduced anterior laryngeal mobility;Reduced epiglottic inversion;Reduced laryngeal elevation;Reduced tongue base retraction Pharyngeal -- Pharyngeal- Mechanical Soft -- Pharyngeal -- Pharyngeal- Regular -- Pharyngeal -- Pharyngeal- Multi-consistency -- Pharyngeal -- Pharyngeal- Pill Penetration/Aspiration during swallow;Delayed swallow initiation-pyriform sinuses;Delayed swallow initiation-vallecula;Pharyngeal residue - valleculae;Pharyngeal residue - pyriform Pharyngeal Material enters airway, passes BELOW cords and not ejected out despite cough attempt by patient Pharyngeal Comment --  CHL IP CERVICAL ESOPHAGEAL PHASE 08/07/2018 Cervical  Esophageal Phase Impaired Pudding Teaspoon --  Pudding Cup -- Honey Teaspoon -- Honey Cup -- Nectar Teaspoon -- Nectar Cup -- Nectar Straw -- Thin Teaspoon -- Thin Cup Reduced cricopharyngeal relaxation Thin Straw -- Puree Reduced cricopharyngeal relaxation Mechanical Soft -- Regular -- Multi-consistency -- Pill Reduced cricopharyngeal relaxation Cervical Esophageal Comment -- [name], [name] 08/07/2018, 4:41 PM  Jeffrey Baller, [name], [name] Speech Therapy [facility] Acute Rehab Pager: [phone]              Antimicrobials:   cipro day 3    Subjective: Patient reports he was able to get sleep last night, although family reports he told him he was coughing most of the night.  It is difficult to get of accurate report from patient in the setting of his dementia.  Objective: Vitals:   08/08/18 0413 08/08/18 0806 08/08/18 1356 08/08/18 1437  BP: 122/74  109/72   Pulse: 85  78   Resp: 19  17   Temp: (!) 97.5 F (36.4 C)  97.7 F (36.5 C)   TempSrc: Oral  Oral   SpO2: 93% 93% 94% 95%  Weight: 75.1 kg     Height:        Intake/Output Summary (Last 24 hours) at 08/08/2018 1611 Last data filed at 08/08/2018 1054 Gross per 24 hour  Intake 640 ml  Output 600 ml  Net 40 ml   Filed Weights   08/06/18 1002 08/07/18 0524 08/08/18 0413  Weight: 57.4 kg 75 kg 75.1 kg    Examination:  General exam: Appears calm and comfortable  Respiratory system: Mild rhonchi bilaterally, partially cleared by coughing, no wheezing noted. Cardiovascular system: S1 & S2 heard, RRR. No JVD, murmurs, rubs, gallops or clicks. No pedal edema. Gastrointestinal system: Abdomen is nondistended, soft and nontender. No organomegaly or masses felt. Normal bowel sounds heard. Central nervous system: Alert not oriented. No focal neurological deficits. Extremities: Warm well perfused, no contractures Skin: No rashes, lesions or ulcers Psychiatry: Pleasant, no acute decompensation known cognitive deficits.     Data  Reviewed: I have personally reviewed following labs and imaging studies  CBC: Recent Labs  Lab 08/06/18 1008 08/07/18 0426 08/08/18 0420  WBC 9.6 13.5* 13.0*  NEUTROABS 8.5*  --  12.2*  HGB 12.4* 10.7* 10.7*  HCT 38.4* 31.8* 31.2*  MCV 93.7 90.6 88.6  PLT 274 217 497   Basic Metabolic Panel: Recent Labs  Lab 08/06/18 1008 08/07/18 0426 08/08/18 0420  NA 126* 124* 124*  K 4.5 4.4 4.6  CL 91* 92* 93*  CO2 24 24 22   GLUCOSE 103* 136* 102*  BUN 10 14 20   CREATININE 0.96 0.95 0.95  CALCIUM 8.7* 8.3* 8.6*   GFR: Estimated Creatinine Clearance: 61.5 mL/min (by C-G formula based on SCr of 0.95 mg/dL). Liver Function Tests: No results for input(s): AST, ALT, ALKPHOS, BILITOT, PROT, ALBUMIN in the last 168 hours. No results for input(s): LIPASE, AMYLASE in the last 168 hours. No results for input(s): AMMONIA in the last 168 hours. Coagulation Profile: Recent Labs  Lab 08/02/18 08/06/18 1525 08/07/18 0426 08/08/18 0420  INR 2.6 2.7* 3.4* 2.7*   Cardiac Enzymes: Recent Labs  Lab 08/06/18 1008  TROPONINI <0.03   BNP (last 3 results) No results for input(s): PROBNP in the last 8760 hours. HbA1C: No results for input(s): HGBA1C in the last 72 hours. CBG: No results for input(s): GLUCAP in the last 168 hours. Lipid Profile: No results for input(s): CHOL, HDL, LDLCALC, TRIG, CHOLHDL, LDLDIRECT in the last 72 hours. Thyroid Function  Tests: No results for input(s): TSH, T4TOTAL, FREET4, T3FREE, THYROIDAB in the last 72 hours. Anemia Panel: No results for input(s): VITAMINB12, FOLATE, FERRITIN, TIBC, IRON, RETICCTPCT in the last 72 hours. Sepsis Labs: No results for input(s): PROCALCITON, LATICACIDVEN in the last 168 hours.  Recent Results (from the past 240 hour(s))  SARS Coronavirus 2 (CEPHEID- Performed in [place] hospital lab), Hosp Order     Status: None   Collection Time: 08/06/18 10:41 AM  Result Value Ref Range Status   SARS Coronavirus 2 NEGATIVE NEGATIVE  Final    Comment: (NOTE) If result is NEGATIVE SARS-CoV-2 target nucleic acids are NOT DETECTED. The SARS-CoV-2 RNA is generally detectable in upper and lower  respiratory specimens during the acute phase of infection. The lowest  concentration of SARS-CoV-2 viral copies this assay can detect is 250  copies / mL. A negative result does not preclude SARS-CoV-2 infection  and should not be used as the sole basis for treatment or other  patient management decisions.  A negative result may occur with  improper specimen collection / handling, submission of specimen other  than nasopharyngeal swab, presence of viral mutation(s) within the  areas targeted by this assay, and inadequate number of viral copies  (<250 copies / mL). A negative result must be combined with clinical  observations, patient history, and epidemiological information. If result is POSITIVE SARS-CoV-2 target nucleic acids are DETECTED. The SARS-CoV-2 RNA is generally detectable in upper and lower  respiratory specimens dur ing the acute phase of infection.  Positive  results are indicative of active infection with SARS-CoV-2.  Clinical  correlation with patient history and other diagnostic information is  necessary to determine patient infection status.  Positive results do  not rule out bacterial infection or co-infection with other viruses. If result is PRESUMPTIVE POSTIVE SARS-CoV-2 nucleic acids MAY BE PRESENT.   A presumptive positive result was obtained on the submitted specimen  and confirmed on repeat testing.  While 2019 novel coronavirus  (SARS-CoV-2) nucleic acids may be present in the submitted sample  additional confirmatory testing may be necessary for epidemiological  and / or clinical management purposes  to differentiate between  SARS-CoV-2 and other Sarbecovirus currently known to infect humans.  If clinically indicated additional testing with an alternate test  methodology [phone]) is advised. The  SARS-CoV-2 RNA is generally  detectable in upper and lower respiratory sp ecimens during the acute  phase of infection. The expected result is Negative. Fact Sheet for Patients:  StrictlyIdeas.no Fact Sheet for Healthcare Providers: BankingDealers.co.za This test is not yet approved or cleared by the [place] FDA and has been authorized for detection and/or diagnosis of SARS-CoV-2 by FDA under an Emergency Use Authorization (EUA).  This EUA will remain in effect (meaning this test can be used) for the duration of the COVID-19 declaration under Section 564(b)(1) of the Act, 21 U.S.C. section 360bbb-3(b)(1), unless the authorization is terminated or revoked sooner. Performed at [facility], [place] [address] [place], [name] [address]          Radiology Studies: Dg Swallowing Func-speech Pathology  Result Date: 08/07/2018 Objective Swallowing Evaluation: Type of Study: MBS-Modified Barium Swallow Study  Patient Details Name: [name] MRN: [id number] Date of Birth: 09-09-1932 Today's Date: 08/07/2018 Time: SLP Start Time (ACUTE ONLY): 1330 -SLP Stop Time (ACUTE ONLY): 1355 SLP Time Calculation (min) (ACUTE ONLY): 25 min Past Medical History: Past Medical History: Diagnosis Date  Aortic stenosis, severe   WITH PERCUTANEOUS  AORTIC VALVE (TAVI) IN March 2012 at the [facility]  Aspiration into airway   Cervical myelopathy (HCC)   Chronic anticoagulation   on coumadin  Chronic systolic CHF (congestive heart failure) ([place])   Coronary artery disease   a.  s/p CABG 1979 and redo 1991.  Coughing   Diverticulitis   CURRENTLY CONTROLLED WITH NO EVIDENCE OF RECURRENT INFECTION  Esophageal stricture   WITH DILATATION  Frailty   GERD (gastroesophageal reflux disease)   Heart murmur   Hyperlipidemia   Hyponatremia   Ischemic cardiomyopathy 05/2010  Has EF of 25%  MI (myocardial infarction) ([place]) 1974, 1978  Osteoarthritis    RIGHT KNEE  Osteoporosis   Permanent atrial fibrillation   Has not tolerated amiodarone in the past. Amiodarone was stopped in September of 2010 due to side effects.  Presence of cardioverter defibrillator   Prostate cancer [facility])   Pulmonary embolism ([place])   Stroke ([place])   3 strokes  VT (ventricular tachycardia) ([place])  Past Surgical History: Past Surgical History: Procedure Laterality Date  AORTIC VALVE REPLACEMENT    Percutaneous AVR in March 2012 at the [place]  2010  SEVERE LV DYSFUNCTION WITH ESTIMATED EJECTION FRACTION OF 25%  CARDIOVERSION  02/16/2011  Procedure: CARDIOVERSION;  Surgeon: Carlena Bjornstad, MD;  Location: [place];  Service: Cardiovascular;  Laterality: N/A;  [place]  ICD  Feb 2003; 02/2013  gen change 02-11-2013 by Dr Caryl Comes  IMPLANTABLE CARDIOVERTER DEFIBRILLATOR (ICD) GENERATOR CHANGE N/A 02/10/2013  Procedure: ICD GENERATOR CHANGE;  Surgeon: Deboraha Sprang, MD;  Location: [facility] CATH LAB;  Service: Cardiovascular;  Laterality: N/A;  SHOULDER SURGERY   HPI: Patient is an 85 y.o. male with PMH: CAD s/p CABG in 1979, atrial fibrillation, CVA, dysphagia, prostate cancer s/p radiation, BPH and hyponatremia, who presented to hospital after incident at home during which he swallowed a lot of water and started coughing and was SOB. MBS that was completed on 05/09/18 recommended Regular solids, thin liquids with aspiration precautions (chin tuck to clear residuals, eating solids and liquids separately). CXR revealed large airspace opacity in right lung concerning for PNA.  Subjective: pleasant, alert Assessment / Plan / Recommendation CHL IP CLINICAL IMPRESSIONS 08/07/2018 Clinical Impression Patient exhibits a moderate pharyngeal dysphagia characterized by decreased laryngeal elevation, decreaesed pharyngeal contraction and significantly restricted UES  opening, leading to vallecular and pyriform sinus residuals and slow transit of thin liquids and puree solids boluses through UES. Patient had sensed aspiration before the swallow when taking barium tablet with liquids. Barium tablet became briefly lodged at level of vallecular sinus, and then pyriform sinus, before transiting through UES. Patient exhibited silent pentration during the swallow with thin liquids, occuring mainly when vallecular and pyriform sinuses were full. Chin tuck posture did help to clear some of pharyngeal residuals, but patient had to put a lot of effort into performing dry swallows after PO's, and full clearance of pharynx was never achieved. No aspiration of vallecular or pyriform sinus residuals observed during this study, but very likely to occur during regular PO intake. As compared to MBS on 05/09/18, there is not much that has changed other than a slight decrease in UES opening. In addition, 2014 MBS report describes many of the same swallow function deficits that were noted in today's MBS. Plan is to speak with patient and MD regarding swallow  safety and determining how to maximize safety but also nutrition and pleasure with PO intake. Aside from patient education, direct intervention is not likely to improve his swallow function, as it has been chronic since 2014.  SLP Visit Diagnosis Dysphagia, pharyngeal phase (R13.13) Attention and concentration deficit following -- Frontal lobe and executive function deficit following -- Impact on safety and function Severe aspiration risk   CHL IP TREATMENT RECOMMENDATION 08/07/2018 Treatment Recommendations Therapy as outlined in treatment plan below   Prognosis 08/07/2018 Prognosis for Safe Diet Advancement Fair Barriers to Reach Goals Severity of deficits;Time post onset Barriers/Prognosis Comment -- CHL IP DIET RECOMMENDATION 08/07/2018 SLP Diet Recommendations Thin liquid;Dysphagia 1 (Puree) solids Liquid Administration via Cup Medication  Administration Crushed with puree Compensations Slow rate;Small sips/bites;Clear throat intermittently Postural Changes Remain semi-upright after after feeds/meals (Comment);Seated upright at 90 degrees   CHL IP OTHER RECOMMENDATIONS 08/07/2018 Recommended Consults -- Oral Care Recommendations Oral care BID Other Recommendations --   CHL IP FOLLOW UP RECOMMENDATIONS 08/07/2018 Follow up Recommendations None   CHL IP FREQUENCY AND DURATION 08/07/2018 Speech Therapy Frequency (ACUTE ONLY) min 1 x/week Treatment Duration 1 week      CHL IP ORAL PHASE 08/07/2018 Oral Phase Impaired Oral - Pudding Teaspoon -- Oral - Pudding Cup -- Oral - Honey Teaspoon -- Oral - Honey Cup -- Oral - Nectar Teaspoon -- Oral - Nectar Cup -- Oral - Nectar Straw -- Oral - Thin Teaspoon -- Oral - Thin Cup -- Oral - Thin Straw -- Oral - Puree Weak lingual manipulation;Delayed oral transit;Reduced posterior propulsion Oral - Mech Soft -- Oral - Regular -- Oral - Multi-Consistency -- Oral - Pill -- Oral Phase - Comment --  CHL IP PHARYNGEAL PHASE 08/07/2018 Pharyngeal Phase Impaired Pharyngeal- Pudding Teaspoon -- Pharyngeal -- Pharyngeal- Pudding Cup -- Pharyngeal -- Pharyngeal- Honey Teaspoon -- Pharyngeal -- Pharyngeal- Honey Cup -- Pharyngeal -- Pharyngeal- Nectar Teaspoon -- Pharyngeal -- Pharyngeal- Nectar Cup -- Pharyngeal -- Pharyngeal- Nectar Straw -- Pharyngeal -- Pharyngeal- Thin Teaspoon -- Pharyngeal -- Pharyngeal- Thin Cup Delayed swallow initiation-vallecula;Pharyngeal residue - valleculae;Pharyngeal residue - pyriform;Penetration/Aspiration before swallow;Penetration/Aspiration during swallow;Reduced tongue base retraction;Reduced epiglottic inversion;Reduced pharyngeal peristalsis;Reduced laryngeal elevation;Reduced anterior laryngeal mobility;Compensatory strategies attempted (with notebox) Pharyngeal Material enters airway, remains ABOVE vocal cords and not ejected out;Material enters airway, passes BELOW cords and not ejected out  despite cough attempt by patient Pharyngeal- Thin Straw -- Pharyngeal -- Pharyngeal- Puree Delayed swallow initiation-vallecula;Pharyngeal residue - valleculae;Pharyngeal residue - pyriform;Compensatory strategies attempted (with notebox);Reduced pharyngeal peristalsis;Reduced anterior laryngeal mobility;Reduced epiglottic inversion;Reduced laryngeal elevation;Reduced tongue base retraction Pharyngeal -- Pharyngeal- Mechanical Soft -- Pharyngeal -- Pharyngeal- Regular -- Pharyngeal -- Pharyngeal- Multi-consistency -- Pharyngeal -- Pharyngeal- Pill Penetration/Aspiration during swallow;Delayed swallow initiation-pyriform sinuses;Delayed swallow initiation-vallecula;Pharyngeal residue - valleculae;Pharyngeal residue - pyriform Pharyngeal Material enters airway, passes BELOW cords and not ejected out despite cough attempt by patient Pharyngeal Comment --  CHL IP CERVICAL ESOPHAGEAL PHASE 08/07/2018 Cervical Esophageal Phase Impaired Pudding Teaspoon -- Pudding Cup -- Honey Teaspoon -- Honey Cup -- Nectar Teaspoon -- Nectar Cup -- Nectar Straw -- Thin Teaspoon -- Thin Cup Reduced cricopharyngeal relaxation Thin Straw -- Puree Reduced cricopharyngeal relaxation Mechanical Soft -- Regular -- Multi-consistency -- Pill Reduced cricopharyngeal relaxation Cervical Esophageal Comment -- [name], [name] 08/07/2018, 4:41 PM  Jeffrey Baller, [name], [name] Speech [facility] Pager: [phone]                 Scheduled Meds:  albuterol  2.5 mg Nebulization TID  albuterol  5 mg Nebulization Once   alfuzosin  10 mg Oral QHS   aspirin EC  81 mg Oral Daily   carvedilol  6.25 mg Oral BID WC   famotidine  40 mg Oral Daily   feeding supplement (ENSURE ENLIVE)  237 mL Oral BID BM   fluticasone furoate-vilanterol  1 puff Inhalation Daily   levothyroxine  125 mcg Oral QAC breakfast   linaclotide  145 mcg Oral QAC breakfast   mexiletine  150 mg Oral BID   warfarin  4 mg Oral ONCE-1800   Warfarin -  Pharmacist Dosing Inpatient   Does not apply q1800   Continuous Infusions:  ciprofloxacin 400 mg (08/08/18 1122)     LOS: 1 day    Time spent: 61 min    Nicolette Bang, MD [facility]  If 7PM-7AM, please contact night-coverage  08/08/2018, 4:11 PM

## 2018-08-08 NOTE — Progress Notes (Signed)
  Speech Language Pathology Treatment: Dysphagia  Patient Details Name: [name] MRN: [id number] DOB: 1932/06/27 Today's Date: 08/08/2018 Time: 2426-8341 SLP Time Calculation (min) (ACUTE ONLY): 25 min  Assessment / Plan / Recommendation Clinical Impression   Patient seen to address dysphagia goals with focus on education of swallow strategies as well as introduction of EMST (Expiratory Muscle Strength Training) to aid in improving his ability to better protect his airway with stronger cough,etc. Patient was able to perform at lowest setting on device and said that it was "difficult", but he was able to perform five reps two times with break in between. Patient able to verbalize swallowing strategies previously learned of small bites, swallowing multiple times, but required cues to demonstrate recall of chin tuck posture for decreasing pharyngeal residuals. Patient did exhibit some confusion as he did not seem to completely recognize this SLP, despite having had both BSE and MBS with SLP.      HPI HPI: Patient is an 85 y.o. male with PMH: CAD s/p CABG in 1979, atrial fibrillation, CVA, dysphagia, prostate cancer s/p radiation, BPH and hyponatremia, who presented to hospital after incident at home during which he swallowed a lot of water and started coughing and was SOB. MBS that was completed on 05/09/18 recommended Regular solids, thin liquids with aspiration precautions (chin tuck to clear residuals, eating solids and liquids separately). CXR revealed large airspace opacity in right lung concerning for PNA.       SLP Plan  Continue with current plan of care       Recommendations  Diet recommendations: Dysphagia 1 (puree);Thin liquid Liquids provided via: Cup;Straw Medication Administration: Crushed with puree Supervision: Patient able to self feed;Intermittent supervision to cue for compensatory strategies Compensations: Slow rate;Small sips/bites;Clear throat intermittently Postural  Changes and/or Swallow Maneuvers: Seated upright 90 degrees;Upright 30-60 min after meal                Oral Care Recommendations: Oral care BID Follow up Recommendations: Skilled Nursing facility;Home health SLP SLP Visit Diagnosis: Dysphagia, pharyngeal phase (R13.13) Plan: Continue with current plan of care       GO                Kushal, Saunders 08/08/2018, 4:22 PM   Sonia Baller, MA, [place] [facility] Pager: [phone]

## 2018-08-08 NOTE — Progress Notes (Signed)
Initial Nutrition Assessment  DOCUMENTATION CODES:   Not applicable  INTERVENTION:   -Increase Ensure Enlive po TID, each supplement provides 350 kcal and 20 grams of protein -MVI with minerals daily -Magic Cup TID with meals, each supplement provides 290 kcals and 9 grams protein -Hormel Shake TID with meals, each supplement provides 520 kcals and 22 grams protein  NUTRITION DIAGNOSIS:   Increased nutrient needs related to wound healing as evidenced by estimated needs.  GOAL:   Patient will meet greater than or equal to 90% of their needs  MONITOR:   PO intake, Supplement acceptance, Diet advancement, Labs, Weight trends, Skin, I & O's  REASON FOR ASSESSMENT:   Other (Comment)    ASSESSMENT:   [name] is a 85 y.o. male with medical history significant of CAD s/p CABG in 1979 with redo 1991, permanent atrial fibrillation on Coumadin, CVA, dysphagia, prostate cancer s/p radiation, BPH, and hyponatremia; who presented after he was sitting at home when he swallowed a bunch of water and went down the wrong way.  Thereafter, reported coughing and acute shortness of breath.  He has had issues with swallowing ever since his previous history of stroke.  Patient's wife states that he just was recently evaluated and had a modified barium swallow study in February.  Wife reports that he is on a regular diet and has been told what techniques to do multiple times, but does not always follow them.  Denies having any fever, chills, nausea, vomiting, diarrhea, nausea, and vomiting.  He chronically has urinary frequency and urgency with urination and he relates with history of prostate radiation.  He also reports that the left leg is always a little swollen due to veins being removed, but reports that it is at baseline.  Review of records notes that patient was just recently hospitalized for congestive heart failure exacerbation from 5/11-5/13.  Pt admitted with acute respiratory failure  secondary to aspiration pneumonia.   5/27- s/p MBSS- recommending dysphagia 1 diet with thin liquids  Reviewed I/O's: +80 ml x 24 hours and +57 ml since admission  UOP: 400 ml x 24 hours  Pt familiar to this RD secondary to previous admission earlier this month. Pt with ongoing issues with dysphagia and poor oral intake. Per last admission, pt reports he would not desire a feeding tube.   Pt not available at time of visit. Intake remains variable; PO: 25-100%.   Reviewed wt hx; pt has experienced a 4.8% wt loss over the past year, which is nto significant for time frame.   Due to restrictions of dysphagia 1 diet and history of poor oral intake and dysphagia, pt would benefit from addition of nutritional supplements to help optimize oral intake.   Per MD notes, pt may discharge to SNF once medically stable.   Labs reviewed: Na: 124.   NUTRITION - FOCUSED PHYSICAL EXAM:    Most Recent Value  Orbital Region  Unable to assess  Upper Arm Region  Unable to assess  Thoracic and Lumbar Region  Unable to assess  Buccal Region  Unable to assess  Temple Region  Unable to assess  Clavicle Bone Region  Unable to assess  Clavicle and Acromion Bone Region  Unable to assess  Scapular Bone Region  Unable to assess  Dorsal Hand  Unable to assess  Patellar Region  Unable to assess  Anterior Thigh Region  Unable to assess  Posterior Calf Region  Unable to assess  Edema (RD Assessment)  Unable  to assess  Hair  Unable to assess  Eyes  Unable to assess  Mouth  Unable to assess  Skin  Unable to assess  Nails  Unable to assess       Diet Order:   Diet Order            DIET - DYS 1 Room service appropriate? Yes; Fluid consistency: Thin  Diet effective now              EDUCATION NEEDS:   No education needs have been identified at this time  Skin:  Skin Assessment: Skin Integrity Issues: Skin Integrity Issues:: Other (Comment), Stage I, Stage II Stage I: back, anus Stage II:  sacrum Other: non pressure wound to buttocks  Last BM:  08/06/18  Height:   Ht Readings from Last 1 Encounters:  08/06/18 6\' 2"  (1.88 m)    Weight:   Wt Readings from Last 1 Encounters:  08/08/18 75.1 kg    Ideal Body Weight:  86.4 kg  BMI:  Body mass index is 21.25 kg/m.  Estimated Nutritional Needs:   Kcal:  2050-2250  Protein:  100-115 grams  Fluid:  > 2.0 L    Colie Josten A. Jimmye Norman, RD, LDN, [place] Registered Dietitian II Certified Diabetes Care and Education Specialist Pager: [phone] After hours Pager: [phone]

## 2018-08-08 NOTE — Evaluation (Signed)
Occupational Therapy Evaluation Patient Details Name: [name] MRN: [id number] DOB: 1933-01-16 Today's Date: 08/08/2018    History of Present Illness Pt is an 85 y.o. male admitted on 08/06/2018 with right lower lobe PNA. Pt. Pt also with poor appetite, dysphagia with aspiration events. PMH includes CAD s/p CABG, afib, CHF, anemia, R knee OA, strokes, PE.   Clinical Impression   Pt with decline in function and safety with ADLs and ADL mobility with deceased strength, balance and endurance. Pt fatigues easily and is confused. PTA, pt lived at home with his wife and has aides to assist with some ADLs and used RW for mobility. Pt currently requires max - mod A with LB ADLs and min guard A with transfers and functional mobility. Pt would benefit from acute OT services to maximize level of function and sand safety and ST SNF for rehab before returning home    Follow Up Recommendations  SNF    Equipment Recommendations  Other (comment)(TBD at next venue of care)    Recommendations for Other Services       Precautions / Restrictions Precautions Precautions: Fall Restrictions Weight Bearing Restrictions: No      Mobility Bed Mobility               General bed mobility comments: pt with nurse tech walking to bathroom upon arrival. Bed alarm ringnig as pt attempted to get OOB without assist per NT  Transfers Overall transfer level: Needs assistance Equipment used: Rolling walker (2 wheeled) Transfers: Sit to/from Stand Sit to Stand: Min guard              Balance Overall balance assessment: Needs assistance Sitting-balance support: No upper extremity supported Sitting balance-Leahy Scale: Good     Standing balance support: Bilateral upper extremity supported;Single extremity supported Standing balance-Leahy Scale: Poor                             ADL either performed or assessed with clinical judgement   ADL Overall ADL's : Needs  assistance/impaired     Grooming: Wash/dry hands;Wash/dry face;Standing;Min guard   Upper Body Bathing: Supervision/ safety;Set up;Sitting Upper Body Bathing Details (indicate cue type and reason): simulated Lower Body Bathing: Moderate assistance;Sitting/lateral leans   Upper Body Dressing : Supervision/safety;Set up;Sitting   Lower Body Dressing: Maximal assistance   Toilet Transfer: Min guard;Ambulation;RW;Cueing for sequencing;Cueing for safety;Grab bars   Toileting- Clothing Manipulation and Hygiene: Moderate assistance       Functional mobility during ADLs: Min guard;Rolling walker       Vision Patient Visual Report: No change from baseline       Perception     Praxis      Pertinent Vitals/Pain Pain Assessment: No/denies pain Pain Score: 0-No pain Faces Pain Scale: No hurt Pain Intervention(s): Monitored during session     Hand Dominance Right   Extremity/Trunk Assessment Upper Extremity Assessment Upper Extremity Assessment: Generalized weakness   Lower Extremity Assessment Lower Extremity Assessment: Defer to PT evaluation   Cervical / Trunk Assessment Cervical / Trunk Assessment: Kyphotic   Communication Communication Communication: HOH   Cognition Arousal/Alertness: Awake/alert Behavior During Therapy: WFL for tasks assessed/performed Overall Cognitive Status: No family/caregiver present to determine baseline cognitive functioning Area of Impairment: Attention;Memory;Following commands;Safety/judgement;Awareness;Problem solving                 Orientation Level: Disoriented to;Time;Situation   Memory: Decreased short-term memory Following Commands: Follows one step commands  inconsistently     Problem Solving: Difficulty sequencing;Requires verbal cues;Requires tactile cues General Comments: pt confused   General Comments       Exercises     Shoulder Instructions      Home Living Family/patient expects to be discharged to::  Private residence Living Arrangements: Spouse/significant other Available Help at Discharge: Family;Available 24 hours/day Type of Home: House Home Access: Stairs to enter [employer] of Steps: 4 Entrance Stairs-Rails: Right Home Layout: Two level;Able to live on main level with bedroom/bathroom     Bathroom Shower/Tub: Walk-in shower   Bathroom Toilet: Handicapped height     Home Equipment: Environmental consultant - 2 wheels;Walker - 4 wheels;Bedside commode;Grab bars - tub/shower;Grab bars - toilet;Shower seat   Additional Comments: Has lived on first floor of home for years      Prior Functioning/Environment Level of Independence: Needs assistance;Independent with assistive device(s)  Gait / Transfers Assistance Needed: Mod indep with rollator ADL's / Homemaking Assistance Needed: Reports indep with ADLs; wife assists with IADLs            OT Problem List: Decreased strength;Decreased activity tolerance;Decreased cognition;Decreased knowledge of use of DME or AE;Decreased safety awareness;Decreased knowledge of precautions;Impaired balance (sitting and/or standing)      OT Treatment/Interventions: Self-care/ADL training;Therapeutic exercise;Therapeutic activities;Patient/family education;Balance training    OT Goals(Current goals can be found in the care plan section) Acute Rehab OT Goals Patient Stated Goal: Return home OT Goal Formulation: With patient Time For Goal Achievement: 08/22/18 Potential to Achieve Goals: Good ADL Goals Pt Will Perform Grooming: with set-up;with supervision;standing Pt Will Perform Lower Body Bathing: with min assist;sitting/lateral leans;sit to/from stand Pt Will Perform Lower Body Dressing: with mod assist;sitting/lateral leans;sit to/from stand Pt Will Transfer to Toilet: with supervision;ambulating;grab bars Pt Will Perform Toileting - Clothing Manipulation and hygiene: with min assist;sit to/from stand  OT Frequency: Min 2X/week    Barriers to D/C: Decreased caregiver support          Co-evaluation              AM-PAC OT "6 Clicks" Daily Activity     Outcome Measure Help from another person eating meals?: None Help from another person taking care of personal grooming?: A Little Help from another person toileting, which includes using toliet, bedpan, or urinal?: A Lot Help from another person bathing (including washing, rinsing, drying)?: A Lot Help from another person to put on and taking off regular upper body clothing?: A Little Help from another person to put on and taking off regular lower body clothing?: A Lot 6 Click Score: 16   End of Session Equipment Utilized During Treatment: Gait belt;Rolling walker  Activity Tolerance: Patient tolerated treatment well;Patient limited by fatigue Patient left: in chair;with call bell/phone within reach;with chair alarm set  OT Visit Diagnosis: Unsteadiness on feet (R26.81);Other abnormalities of gait and mobility (R26.89);Muscle weakness (generalized) (M62.81);Other symptoms and signs involving cognitive function                Time: 1410-1435 OT Time Calculation (min): 25 min Charges:  OT General Charges $OT Visit: 1 Visit OT Evaluation $OT Eval Moderate Complexity: 1 Mod OT Treatments $Self Care/Home Management : 8-22 mins    Britt Bottom 08/08/2018, 2:47 PM

## 2018-08-09 LAB — BASIC METABOLIC PANEL
Anion gap: 14 (ref 5–15)
BUN: 17 mg/dL (ref 8–23)
CO2: 25 mmol/L (ref 22–32)
Calcium: 8.9 mg/dL (ref 8.9–10.3)
Chloride: 88 mmol/L — ABNORMAL LOW (ref 98–111)
Creatinine, Ser: 0.91 mg/dL (ref 0.61–1.24)
GFR calc Af Amer: >60 mL/min (ref >60)
GFR calc non Af Amer: >60 mL/min (ref >60)
Glucose, Bld: 98 mg/dL (ref 70–99)
Potassium: 4.4 mmol/L (ref 3.5–5.1)
Sodium: 127 mmol/L — ABNORMAL LOW (ref 135–145)

## 2018-08-09 LAB — PROTIME-INR
INR: 2.5 — ABNORMAL HIGH (ref 0.8–1.2)
Prothrombin Time: 26.4 seconds — ABNORMAL HIGH (ref 11.4–15.2)

## 2018-08-09 MED ORDER — CLINDAMYCIN HCL 300 MG PO CAPS: 300.0000 mg | ORAL_CAPSULE | Freq: Three times a day (TID) | ORAL | 0 refills | Status: DC

## 2018-08-09 NOTE — Progress Notes (Signed)
Physical Therapy Treatment Patient Details Name: [name] MRN: [id number] DOB: 1932/05/20 Today's Date: 08/09/2018    History of Present Illness Pt is an 85 y.o. male admitted on 08/06/2018 with right lower lobe PNA. Pt. Pt also with poor appetite, dysphagia with aspiration events. PMH includes CAD s/p CABG, afib, CHF, anemia, R knee OA, strokes, PE.    PT Comments    Patient seen for mobility progression. Sit to stand x 2 at bedside to use urinal and don underwear. Patient requiring elevated bed height as well as up to Mod A to stand with noted posterior lean in standing requiring increased time to obtain balance. Patient wishing to return to supine following this activity, due to reported feelings of nausea, with Min A to return. Will continue to follow.    Follow Up Recommendations  SNF;Supervision/Assistance - 24 hour     Equipment Recommendations  None recommended by PT    Recommendations for Other Services       Precautions / Restrictions Precautions Precautions: Fall Restrictions Weight Bearing Restrictions: No    Mobility  Bed Mobility Overal bed mobility: Needs Assistance Bed Mobility: Supine to Sit;Sit to Supine     Supine to sit: Min assist Sit to supine: Min assist   General bed mobility comments: Min A to come to EOB and to return to supine for LE and trunk management  Transfers Overall transfer level: Needs assistance Equipment used: Rolling walker (2 wheeled) Transfers: Sit to/from Stand Sit to Stand: Mod assist;From elevated surface         General transfer comment: Sit to stand x 2 at elevated height bedside with Mod A - cueing for hand placement and safety; heavy trunk flexion  Ambulation/Gait             General Gait Details: deferred   Stairs             Wheelchair Mobility    Modified Rankin (Stroke Patients Only)       Balance Overall balance assessment: Needs assistance Sitting-balance support: Bilateral upper  extremity supported;Feet supported Sitting balance-Leahy Scale: Fair     Standing balance support: Bilateral upper extremity supported;Single extremity supported Standing balance-Leahy Scale: Poor Standing balance comment: Reliant on UE support                            Cognition Arousal/Alertness: Awake/alert Behavior During Therapy: WFL for tasks assessed/performed Overall Cognitive Status: No family/caregiver present to determine baseline cognitive functioning                                        Exercises      General Comments        Pertinent Vitals/Pain Pain Assessment: No/denies pain    Home Living                      Prior Function            PT Goals (current goals can now be found in the care plan section) Acute Rehab PT Goals Patient Stated Goal: Return home PT Goal Formulation: With patient Time For Goal Achievement: 08/06/18 Potential to Achieve Goals: Good Progress towards PT goals: Progressing toward goals    Frequency    Min 3X/week      PT Plan Current plan remains appropriate    Co-evaluation  AM-PAC PT "6 Clicks" Mobility   Outcome Measure  Help needed turning from your back to your side while in a flat bed without using bedrails?: A Little Help needed moving from lying on your back to sitting on the side of a flat bed without using bedrails?: A Little Help needed moving to and from a bed to a chair (including a wheelchair)?: A Little Help needed standing up from a chair using your arms (e.g., wheelchair or bedside chair)?: A Lot Help needed to walk in hospital room?: A Lot Help needed climbing 3-5 steps with a railing? : A Lot 6 Click Score: 15    End of Session Equipment Utilized During Treatment: Gait belt Activity Tolerance: Patient tolerated treatment well Patient left: in bed;with call bell/phone within reach;with bed alarm set Nurse Communication: Mobility status PT  Visit Diagnosis: Other abnormalities of gait and mobility (R26.89);Muscle weakness (generalized) (M62.81)     Time: 1610-9604 PT Time Calculation (min) (ACUTE ONLY): 14 min  Charges:  $Therapeutic Activity: 8-22 mins                     Lanney Gins, PT, DPT Supplemental Physical Therapist 08/09/18 1:41 PM Pager: [phone] Office: [phone]

## 2018-08-09 NOTE — Care Management Important Message (Signed)
Important Message  Patient Details  Name: [name] MRN: [id number] Date of Birth: 1933/02/12   Medicare Important Message Given:  Yes    Doyne Micke 08/09/2018, 1:01 PM

## 2018-08-09 NOTE — Progress Notes (Signed)
Pt's wife called after pt's DC. Wife was very upset and stated that pt had a large "gash" on his right arm and wanted to know why no one told her about it. Wife also stated that the pt had told her that he got the "gash" from him falling " a few days ago" but was unable to tell this RN the exact date. Wife expressed her concern and frustration on not being notified of the "fall". This RN explained to wife that the pt had no hx of fall during this admission.   RN talked to the NT, Wynetta Emery and he explained that he had helped pt out of the bed to go to the bathroom on 08/08/18. Pt was sitting on the side of the bed with walker, Wynetta Emery, NT & Anderson Malta, RN at the bedside to assist pt. When pt sat up he laid back and caught himself by putting his right arm on the mattress causing a skin tear on the right arm. The RN, Anderson Malta was at the bedside and a pink foam was applied to the skin tear by RN. This RN communicated this to the wife but wife was upset and stated that the pt has a home nurse. The wife also stated that the home RN will take pictures of the pt's skin and that she was not happy with the situation and that we would be "hearing" from her again. This RN apologized to the wife regarding the miscommunication and regarding the skin tear. This RN also reassured her that if the pt had fallen then there would have been a report filled and that she would have been notified. This RN also asked her if she wanted to speak to [employer] but wife refused. This RN notified the charge RN, Mikle Bosworth and Clinical biochemist of [facility] about wife's concerns.      Paulla Fore, RN

## 2018-08-09 NOTE — Progress Notes (Signed)
DISCHARGE NOTE HOME AIMAN NOE to be discharged Home per MD order. Discussed prescriptions and follow up appointments with the patient. Prescriptions given to patient; medication list explained in detail. Patient verbalized understanding.  Skin clean, dry and intact without evidence of skin break down, no evidence of skin tears noted. IV catheter discontinued intact. Site without signs and symptoms of complications. Dressing and pressure applied. Pt denies pain at the site currently. No complaints noted.  Patient free of lines, drains, and wounds.   An After Visit Summary (AVS) was printed and given to the patient. Patient escorted via wheelchair, and discharged home via private auto.  Paulla Fore, RN

## 2018-08-09 NOTE — TOC Transition Note (Signed)
Transition of Care [facility]) - CM/SW Discharge Note   Patient Details  Name: [name] MRN: [id number] Date of Birth: 16-May-1932  Transition of Care [facility] CM/SW Contact:  Royston Bake, RN Phone Number: 08/09/2018, 12:09 PM   Clinical Narrative:    Patient will return home at discharge with [facility] services provided by [facility] [place]) as prior to admission. Patient also has a personal care service for additional assistance in the home. No further needs identified at this time.   Final next level of care: [place] Barriers to Discharge: No Barriers Identified   Patient Goals and CMS Choice Patient states their goals for this hospitalization and ongoing recovery are:: to stay at home CMS Medicare.gov Compare Post Acute Care list provided to:: Patient Choice offered to / list presented to : NA  Discharge Placement                       Discharge Plan and Services In-house Referral: NA Discharge Planning Services: CM Consult Post Acute Care Choice: Resumption of Svcs/PTA Provider          DME Arranged: N/A DME Agency: NA       HH Arranged: RN, OT, PT, Nurse's Aide Lillian Agency: NA        Social Determinants of Health (SDOH) Interventions     Readmission Risk Interventions No flowsheet data found.

## 2018-08-09 NOTE — Discharge Summary (Signed)
Physician Discharge Summary  [name] VZD:[id number] DOB: 1932/11/27 DOA: 08/06/2018  PCP: [name], MD  Admit date: 08/06/2018 Discharge date: 08/09/2018  Admitted From: Inpatient Disposition: home  Recommendations for Outpatient Follow-up:  1. Follow up with PCP in 1-2 weeks 2. Please obtain BMP/CBC in one week :  Home Health:No, family has arranged for 24-hour supervision Equipment/Devices:per PT  Discharge Condition:Stable CODE STATUS:Full code Diet recommendation: Thin liquids dysphagia and pured solids  Brief/Interim Summary: Jeffrey Broyhill Hughesis a 85 y.o.malewith medical history significant ofCADs/p CABG in 1979 with redo 1991, permanent atrial fibrillation on Coumadin, CVA, dysphagia, prostate cancers/pradiation, BPH, and hyponatremia;who presented after he was sitting at home when he swallowed a bunch of water and went down the wrong way. Thereafter, reported coughing and acuteshortness of breath. He has had issues with swallowing ever since his previous history of stroke. Patient's wife states that he just was recently evaluated and had a modified barium swallow study inFebruary.Wife reports that heis on a regular diet andhas been told whattechniquesto do multiple times, but does not alwaysfollow them.Denies having any fever, chills, nausea, vomiting, diarrhea, nausea, andvomiting. He chronically has urinary frequency and urgency with urination and he relates with history of prostate radiation. He also reports that the left leg is always a littleswollen due to veins being removed, but reportsthat it is at baseline. Review of records notes that patient was just recently hospitalized for congestive heart failure exacerbation from 5/11-5/13.  ED Course:On admission to the emergency department patient was noted to be afebrile, pulse 73-1 08, respiration 14-25, O2 saturations 93 to 100% on 2 L of oxygen. Labs revealed WBC 9.6, hemoglobin 12.4, sodium 126,  and BNP 904.COVID screening test negative. Chest x-ray showing interval placement of a large airspace opacity in the right lung concerning for pneumonia. Patient had been given 80 mg of Lasix IV, ciprofloxacin, and a breathing treatment  Hospital course: Acute respiratory failure second hypoxia secondary toaspiration pneumonia acute Patient with history of aspiration, Speech eval with modified barium swallow recommending TL dysphagia puree solids wean supplemental O2 2 L-patient normally on room air at home Aspiration precautions to be continued on discharge Elevate head of bed liver possible Patient will complete an additional 4 days of antibiotics at home. Extensive discussion with family regarding high risk of aspiration  History of CVA with dysphagia Diet recommendations per speech as above As noted modified barium swallow as above Patient high risk of aspiration.  COPD with mild exacerbation Patient received 80 mg of Solu-Medrol in the ED No wheezing on my evaluation today he was not in exacerbation Continue nebs without change Supplemental O2 maintain sats in the low 90s.  A. fib on chronic anticoagulation Continue rate control medications Coumadin dosing by pharmacy  Chronic hyponatremia. Mild low osmols, watched volume status Received po lasix, appropriate outpt, family educated on volume status monitoring at home Patient reportedly is at his baseline No neurological deficits Potassium is normal  Chronic systolic CHF. BNP mildly elevated but patient does not appear to be volume overloaded He was given 1 dose of p.o. Lasix of 20 mg reported PRN Lasix at home with monitoring as above Added strict I's and O's as well as daily weights whiel in the hospital, remained euvolemic Cautious fluid administration/consumption at home  Debility with presumed dementia: Discussed the case with son who feels mother is unable to care for patient at home Placed PT and OT  consults, family considering skilled nursing facility placement but wife has decided against  History of V. Tach   Hypothyroidism TSH on May 12 was 3.768 Continue current dosing  Discharge Diagnoses:  Active Problems:   Atrial fibrillation, chronic   Anticoagulated on Coumadin   Automatic implantable cardioverter-defibrillator in situ   Hyponatremia   Hypothyroidism   Acute respiratory failure (HCC)   Aspiration pneumonia [facility])    Discharge Instructions  Discharge Instructions    Call MD for:   Complete by:  As directed    Any acute change in medical condition   Increase activity slowly   Complete by:  As directed    Other Restrictions   Complete by:  As directed    ASPIRATION precautions with diet to be thin liquids dysphagia and pured solids     Allergies as of 08/09/2018      Reactions   Penicillins Shortness Of Breath, Rash   Has patient had a PCN reaction causing immediate rash, facial/tongue/throat swelling, SOB or lightheadedness with hypotension: Yes Has patient had a PCN reaction causing severe rash involving mucus membranes or skin necrosis: No Has patient had a PCN reaction that required hospitalization No Has patient had a PCN reaction occurring within the last 10 years: No If all of the above answers are "NO", then may proceed with Cephalosporin use.   Tizanidine Shortness Of Breath   Light headed   Ace Inhibitors    Has not tolerated in the past due to hyperkalemia   Antihistamines, Diphenhydramine-type Other (See Comments)   Inhibits urination   Clemastine Fumarate Other (See Comments)   Inhibits urination   Dimenhydrinate Other (See Comments)   Inhibits urination   Amiodarone Other (See Comments)   Unknown       Medication List    TAKE these medications   alfuzosin 10 MG 24 hr tablet Commonly known as:  UROXATRAL Take 10 mg by mouth at bedtime.   aspirin 81 MG tablet Take 81 mg by mouth daily.   benzonatate 100 MG capsule Commonly  known as:  TESSALON Take 100 mg by mouth 3 (three) times daily as needed for cough.   carvedilol 6.25 MG tablet Commonly known as:  COREG TAKE 1 TABLET BY MOUTH TWICE DAILY WITH A MEAL. What changed:  See the new instructions.   cholecalciferol 1000 units tablet Commonly known as:  VITAMIN D Take 1,000 Units by mouth 2 (two) times daily.   clindamycin 300 MG capsule Commonly known as:  CLEOCIN Take 1 capsule (300 mg total) by mouth 3 (three) times daily for 4 days.   famotidine 40 MG tablet Commonly known as:  PEPCID Take 40 mg by mouth daily.   feeding supplement (ENSURE ENLIVE) Liqd Take 237 mLs by mouth 2 (two) times daily between meals.   furosemide 20 MG tablet Commonly known as:  LASIX TAKE (1) TABLET DAILY AS NEEDED FOR SWELLING. What changed:  See the new instructions.   ipratropium 0.03 % nasal spray Commonly known as:  ATROVENT Place 2 sprays into both nostrils 2 (two) times daily.   levothyroxine 125 MCG tablet Commonly known as:  Synthroid Take 1 tablet (125 mcg total) by mouth daily before breakfast.   lidocaine 5 % Commonly known as:  LIDODERM Place 1 patch onto the skin daily as needed (pain). Remove & Discard patch within 12 hours or as directed by MD   Linzess 145 MCG Caps capsule Generic drug:  linaclotide Take 145 mcg by mouth daily before breakfast.   mexiletine 150 MG capsule Commonly known as:  MEXITIL Take 1  capsule (150 mg total) by mouth 2 (two) times a day.   nitroGLYCERIN 0.4 MG SL tablet Commonly known as:  Nitrostat Place 1 tablet (0.4 mg total) under the tongue every 5 (five) minutes x 3 doses as needed for chest pain (Max 3 doses in 15 min. Call 911).   omeprazole 20 MG capsule Commonly known as:  PRILOSEC Take 20 mg by mouth daily as needed (acid reflux).   polyethylene glycol 17 g packet Commonly known as:  MIRALAX / GLYCOLAX Take 17 g by mouth daily as needed for mild constipation.   PROBIOTIC DAILY PO Take 1 capsule by  mouth daily.   Restasis 0.05 % ophthalmic emulsion Generic drug:  cycloSPORINE Place 1 drop into both eyes 2 (two) times daily as needed (dry eyes).   SYMBICORT IN Inhale 1 puff into the lungs 2 (two) times daily.   traMADol 50 MG tablet Commonly known as:  ULTRAM Take 50 mg by mouth every 6 (six) hours as needed for moderate pain.   warfarin 5 MG tablet Commonly known as:  Coumadin Take as directed. If you are unsure how to take this medication, talk to your nurse or doctor. Original instructions:  Take 1-1.5 tablets (5-7.5 mg total) by mouth See admin instructions. Take 7.5 mg by mouth only on Fridays; all other days take 5mg        Allergies  Allergen Reactions  . Penicillins Shortness Of Breath and Rash    Has patient had a PCN reaction causing immediate rash, facial/tongue/throat swelling, SOB or lightheadedness with hypotension: Yes Has patient had a PCN reaction causing severe rash involving mucus membranes or skin necrosis: No Has patient had a PCN reaction that required hospitalization No Has patient had a PCN reaction occurring within the last 10 years: No If all of the above answers are "NO", then may proceed with Cephalosporin use.   . Tizanidine Shortness Of Breath    Light headed  . Ace Inhibitors     Has not tolerated in the past due to hyperkalemia  . Antihistamines, Diphenhydramine-Type Other (See Comments)    Inhibits urination  . Clemastine Fumarate Other (See Comments)    Inhibits urination  . Dimenhydrinate Other (See Comments)    Inhibits urination  . Amiodarone Other (See Comments)    Unknown     Consultations:  None   Procedures/Studies: Ct Head Wo Contrast  Result Date: 07/23/2018 CLINICAL DATA:  85 year old male with a history of altered mental status EXAM: CT HEAD WITHOUT CONTRAST TECHNIQUE: Contiguous axial images were obtained from the base of the skull through the vertex without intravenous contrast. COMPARISON:  CT 02/07/2016 FINDINGS:  Brain: No acute intracranial hemorrhage. No midline shift or mass effect. Gray-white differentiation maintained. Confluent hypodensity in the bilateral periventricular white matter. Focal hypodensity in the left centrum semiovale, unchanged. Mild senescent volume loss. Unremarkable appearance of the ventricular system. Vascular: Intracranial atherosclerosis. Skull: No acute fracture.  No aggressive bone lesion identified. Sinuses/Orbits: Unremarkable appearance of the orbits. Mastoid air cells clear. No middle ear effusion. No significant sinus disease. Other: None IMPRESSION: Negative for acute intracranial abnormality. Evidence of chronic microvascular ischemic disease. Associated intracranial atherosclerosis. Electronically Signed   By: Corrie Mckusick D.O.   On: 07/23/2018 08:30   Dg Chest Portable 1 View  Result Date: 08/06/2018 CLINICAL DATA:  Shortness of breath. EXAM: PORTABLE CHEST 1 VIEW COMPARISON:  Radiograph of Jul 22, 2018. FINDINGS: Stable cardiomediastinal silhouette. Sternotomy wires are noted. Left-sided pacemaker is unchanged in position. No  pneumothorax is noted. Left lung is clear. New large airspace opacity is noted in the right lung concerning for pneumonia. Minimal right pleural effusion may be present. Bony thorax is unremarkable. IMPRESSION: Interval placement of large airspace opacity in right lung concerning for pneumonia. Followup PA and lateral chest X-ray is recommended in 3-4 weeks following trial of antibiotic therapy to ensure resolution and exclude underlying malignancy. Electronically Signed   By: Marijo Conception M.D.   On: 08/06/2018 10:39   Dg Chest Portable 1 View  Result Date: 07/22/2018 CLINICAL DATA:  Shortness of breath today. History of cardiomyopathy. EXAM: PORTABLE CHEST 1 VIEW COMPARISON:  Single-view of the chest 07/06/2018. PA and lateral chest 09/01/2015. CT chest 07/11/2015. FINDINGS: There is cardiomegaly. The patient is status post aortic valve replacement  and has an AICD in place. Atherosclerosis is noted. No pneumothorax or pleural effusion. Pulmonary vascular congestion is noted. No consolidative process is seen. No acute bony abnormality. IMPRESSION: Cardiomegaly and pulmonary vascular congestion. Electronically Signed   By: Inge Rise M.D.   On: 07/22/2018 17:28   Dg Swallowing Func-speech Pathology  Result Date: 08/07/2018 Objective Swallowing Evaluation: Type of Study: MBS-Modified Barium Swallow Study  Patient Details Name: JUSIAH AGUAYO MRN: [id number] Date of Birth: 11-18-1932 Today's Date: 08/07/2018 Time: SLP Start Time (ACUTE ONLY): 1330 -SLP Stop Time (ACUTE ONLY): 1355 SLP Time Calculation (min) (ACUTE ONLY): 25 min Past Medical History: Past Medical History: Diagnosis Date . Aortic stenosis, severe   WITH PERCUTANEOUS AORTIC VALVE (TAVI) IN March 2012 at the [facility] . Aspiration into airway  . Cervical myelopathy ([place])  . Chronic anticoagulation   on coumadin . Chronic systolic CHF (congestive heart failure) ([place])  . Coronary artery disease   a.  s/p CABG 1979 and redo 1991. [place] Kitchen Coughing  . Diverticulitis   CURRENTLY CONTROLLED WITH NO EVIDENCE OF RECURRENT INFECTION . Esophageal stricture   WITH DILATATION . Frailty  . GERD (gastroesophageal reflux disease)  . Heart murmur  . Hyperlipidemia  . Hyponatremia  . Ischemic cardiomyopathy 05/2010  Has EF of 25% . MI (myocardial infarction) ([place]) 1974, 1978 . Osteoarthritis   RIGHT KNEE . Osteoporosis  . Permanent atrial fibrillation   Has not tolerated amiodarone in the past. Amiodarone was stopped in September of 2010 due to side effects. . Presence of cardioverter defibrillator  . Prostate cancer ([place])  . Pulmonary embolism ([place])  . Stroke [facility])   3 strokes . VT (ventricular tachycardia) ([place])  Past Surgical History: Past Surgical History: Procedure Laterality Date . AORTIC VALVE REPLACEMENT    Percutaneous AVR in March 2012 at the [facility] . BACK SURGERY   . CARDIAC CATHETERIZATION   2010  SEVERE LV DYSFUNCTION WITH ESTIMATED EJECTION FRACTION OF 25% . CARDIOVERSION  02/16/2011  Procedure: CARDIOVERSION;  Surgeon: Carlena Bjornstad, MD;  Location: [facility];  Service: Cardiovascular;  Laterality: N/A; . CHOLECYSTECTOMY   . CORONARY ARTERY BYPASS GRAFT  1979 . CORONARY ARTERY BYPASS GRAFT  1991  REDO SURGERY . ICD  Feb 2003; 02/2013  gen change 02-11-2013 by Dr Caryl Comes . IMPLANTABLE CARDIOVERTER DEFIBRILLATOR (ICD) GENERATOR CHANGE N/A 02/10/2013  Procedure: ICD GENERATOR CHANGE;  Surgeon: Deboraha Sprang, MD;  Location: [facility] CATH LAB;  Service: Cardiovascular;  Laterality: N/A; . SHOULDER SURGERY   HPI: Patient is an 85 y.o. male with PMH: CAD s/p CABG in 1979, atrial fibrillation, CVA, dysphagia, prostate cancer s/p radiation, BPH and hyponatremia, who presented to hospital after incident at home during which  he swallowed a lot of water and started coughing and was SOB. MBS that was completed on 05/09/18 recommended Regular solids, thin liquids with aspiration precautions (chin tuck to clear residuals, eating solids and liquids separately). CXR revealed large airspace opacity in right lung concerning for PNA.  Subjective: pleasant, alert Assessment / Plan / Recommendation CHL IP CLINICAL IMPRESSIONS 08/07/2018 Clinical Impression Patient exhibits a moderate pharyngeal dysphagia characterized by decreased laryngeal elevation, decreaesed pharyngeal contraction and significantly restricted UES opening, leading to vallecular and pyriform sinus residuals and slow transit of thin liquids and puree solids boluses through UES. Patient had sensed aspiration before the swallow when taking barium tablet with liquids. Barium tablet became briefly lodged at level of vallecular sinus, and then pyriform sinus, before transiting through UES. Patient exhibited silent pentration during the swallow with thin liquids, occuring mainly when vallecular and pyriform sinuses were full. Chin tuck posture did help to clear some of  pharyngeal residuals, but patient had to put a lot of effort into performing dry swallows after PO's, and full clearance of pharynx was never achieved. No aspiration of vallecular or pyriform sinus residuals observed during this study, but very likely to occur during regular PO intake. As compared to MBS on 05/09/18, there is not much that has changed other than a slight decrease in UES opening. In addition, 2014 MBS report describes many of the same swallow function deficits that were noted in today's MBS. Plan is to speak with patient and MD regarding swallow safety and determining how to maximize safety but also nutrition and pleasure with PO intake. Aside from patient education, direct intervention is not likely to improve his swallow function, as it has been chronic since 2014.  SLP Visit Diagnosis Dysphagia, pharyngeal phase (R13.13) Attention and concentration deficit following -- Frontal lobe and executive function deficit following -- Impact on safety and function Severe aspiration risk   CHL IP TREATMENT RECOMMENDATION 08/07/2018 Treatment Recommendations Therapy as outlined in treatment plan below   Prognosis 08/07/2018 Prognosis for Safe Diet Advancement Fair Barriers to Reach Goals Severity of deficits;Time post onset Barriers/Prognosis Comment -- CHL IP DIET RECOMMENDATION 08/07/2018 SLP Diet Recommendations Thin liquid;Dysphagia 1 (Puree) solids Liquid Administration via Cup Medication Administration Crushed with puree Compensations Slow rate;Small sips/bites;Clear throat intermittently Postural Changes Remain semi-upright after after feeds/meals (Comment);Seated upright at 90 degrees   CHL IP OTHER RECOMMENDATIONS 08/07/2018 Recommended Consults -- Oral Care Recommendations Oral care BID Other Recommendations --   CHL IP FOLLOW UP RECOMMENDATIONS 08/07/2018 Follow up Recommendations None   CHL IP FREQUENCY AND DURATION 08/07/2018 Speech Therapy Frequency (ACUTE ONLY) min 1 x/week Treatment Duration 1 week       CHL IP ORAL PHASE 08/07/2018 Oral Phase Impaired Oral - Pudding Teaspoon -- Oral - Pudding Cup -- Oral - Honey Teaspoon -- Oral - Honey Cup -- Oral - Nectar Teaspoon -- Oral - Nectar Cup -- Oral - Nectar Straw -- Oral - Thin Teaspoon -- Oral - Thin Cup -- Oral - Thin Straw -- Oral - Puree Weak lingual manipulation;Delayed oral transit;Reduced posterior propulsion Oral - Mech Soft -- Oral - Regular -- Oral - Multi-Consistency -- Oral - Pill -- Oral Phase - Comment --  CHL IP PHARYNGEAL PHASE 08/07/2018 Pharyngeal Phase Impaired Pharyngeal- Pudding Teaspoon -- Pharyngeal -- Pharyngeal- Pudding Cup -- Pharyngeal -- Pharyngeal- Honey Teaspoon -- Pharyngeal -- Pharyngeal- Honey Cup -- Pharyngeal -- Pharyngeal- Nectar Teaspoon -- Pharyngeal -- Pharyngeal- Nectar Cup -- Pharyngeal -- Pharyngeal- Nectar Straw -- Pharyngeal -- Pharyngeal-  Thin Teaspoon -- Pharyngeal -- Pharyngeal- Thin Cup Delayed swallow initiation-vallecula;Pharyngeal residue - valleculae;Pharyngeal residue - pyriform;Penetration/Aspiration before swallow;Penetration/Aspiration during swallow;Reduced tongue base retraction;Reduced epiglottic inversion;Reduced pharyngeal peristalsis;Reduced laryngeal elevation;Reduced anterior laryngeal mobility;Compensatory strategies attempted (with notebox) Pharyngeal Material enters airway, remains ABOVE vocal cords and not ejected out;Material enters airway, passes BELOW cords and not ejected out despite cough attempt by patient Pharyngeal- Thin Straw -- Pharyngeal -- Pharyngeal- Puree Delayed swallow initiation-vallecula;Pharyngeal residue - valleculae;Pharyngeal residue - pyriform;Compensatory strategies attempted (with notebox);Reduced pharyngeal peristalsis;Reduced anterior laryngeal mobility;Reduced epiglottic inversion;Reduced laryngeal elevation;Reduced tongue base retraction Pharyngeal -- Pharyngeal- Mechanical Soft -- Pharyngeal -- Pharyngeal- Regular -- Pharyngeal -- Pharyngeal- Multi-consistency --  Pharyngeal -- Pharyngeal- Pill Penetration/Aspiration during swallow;Delayed swallow initiation-pyriform sinuses;Delayed swallow initiation-vallecula;Pharyngeal residue - valleculae;Pharyngeal residue - pyriform Pharyngeal Material enters airway, passes BELOW cords and not ejected out despite cough attempt by patient Pharyngeal Comment --  CHL IP CERVICAL ESOPHAGEAL PHASE 08/07/2018 Cervical Esophageal Phase Impaired Pudding Teaspoon -- Pudding Cup -- Honey Teaspoon -- Honey Cup -- Nectar Teaspoon -- Nectar Cup -- Nectar Straw -- Thin Teaspoon -- Thin Cup Reduced cricopharyngeal relaxation Thin Straw -- Puree Reduced cricopharyngeal relaxation Mechanical Soft -- Regular -- Multi-consistency -- Pill Reduced cricopharyngeal relaxation Cervical Esophageal Comment -- Tobin, [place] 08/07/2018, 4:41 PM  Sonia Baller, MA, CCC-SLP Speech Therapy [facility] Acute Rehab Pager: [phone]                Subjective: Patient reports he feels well this morning, slept well last night. Reports he feels ready to be discharged back home to the care of his family  Discharge Exam: Vitals:   08/09/18 0829 08/09/18 0832  BP:    Pulse:    Resp:    Temp:    SpO2: 92% 96%   Vitals:   08/08/18 2313 08/09/18 0433 08/09/18 0829 08/09/18 0832  BP: 122/82 99/62    Pulse: 97 84    Resp:  18    Temp:  98.7 F (37.1 C)    TempSrc:  Oral    SpO2:  98% 92% 96%  Weight:      Height:        General: Pt is alert, awake, not in acute distress, chronic cognitive deficits Cardiovascular: RRR, S1/S2 +, no rubs, no gallops Respiratory: CTA bilaterally, no wheezing, no rhonchi Abdominal: Soft, NT, ND, bowel sounds + Extremities: no edema, no cyanosis    The results of significant diagnostics from this hospitalization (including imaging, microbiology, ancillary and laboratory) are listed below for reference.     Microbiology: Recent Results (from the past 240 hour(s))  SARS Coronavirus 2 (CEPHEID- Performed in [facility] hospital lab), Hosp Order     Status: None   Collection Time: 08/06/18 10:41 AM  Result Value Ref Range Status   SARS Coronavirus 2 NEGATIVE NEGATIVE Final    Comment: (NOTE) If result is NEGATIVE SARS-CoV-2 target nucleic acids are NOT DETECTED. The SARS-CoV-2 RNA is generally detectable in upper and lower  respiratory specimens during the acute phase of infection. The lowest  concentration of SARS-CoV-2 viral copies this assay can detect is 250  copies / mL. A negative result does not preclude SARS-CoV-2 infection  and should not be used as the sole basis for treatment or other  patient management decisions.  A negative result may occur with  improper specimen collection / handling, submission of specimen other  than nasopharyngeal swab, presence of viral mutation(s) within the  areas targeted by this assay, and inadequate number of  viral copies  (<250 copies / mL). A negative result must be combined with clinical  observations, patient history, and epidemiological information. If result is POSITIVE SARS-CoV-2 target nucleic acids are DETECTED. The SARS-CoV-2 RNA is generally detectable in upper and lower  respiratory specimens dur ing the acute phase of infection.  Positive  results are indicative of active infection with SARS-CoV-2.  Clinical  correlation with patient history and other diagnostic information is  necessary to determine patient infection status.  Positive results do  not rule out bacterial infection or co-infection with other viruses. If result is PRESUMPTIVE POSTIVE SARS-CoV-2 nucleic acids MAY BE PRESENT.   A presumptive positive result was obtained on the submitted specimen  and confirmed on repeat testing.  While 2019 novel coronavirus  (SARS-CoV-2) nucleic acids may be present in the submitted sample  additional confirmatory testing may be necessary for epidemiological  and / or clinical management purposes  to differentiate between  SARS-CoV-2 and  other Sarbecovirus currently known to infect humans.  If clinically indicated additional testing with an alternate test  methodology [phone]) is advised. The SARS-CoV-2 RNA is generally  detectable in upper and lower respiratory sp ecimens during the acute  phase of infection. The expected result is Negative. Fact Sheet for Patients:  StrictlyIdeas.no Fact Sheet for Healthcare Providers: BankingDealers.co.za This test is not yet approved or cleared by the [place] FDA and has been authorized for detection and/or diagnosis of SARS-CoV-2 by FDA under an Emergency Use Authorization (EUA).  This EUA will remain in effect (meaning this test can be used) for the duration of the COVID-19 declaration under Section 564(b)(1) of the Act, 21 U.S.C. section 360bbb-3(b)(1), unless the authorization is terminated or revoked sooner. Performed at [facility], [place] [address], [place],  [address]      Labs: BNP (last 3 results) Recent Labs    07/06/18 1114 07/22/18 1711 08/06/18 1008  BNP 724.5* 793.6* 106.2*   Basic Metabolic Panel: Recent Labs  Lab 08/06/18 1008 08/07/18 0426 08/08/18 0420 08/09/18 0623  NA 126* 124* 124* 127*  K 4.5 4.4 4.6 4.4  CL 91* 92* 93* 88*  CO2 24 24 22 25   GLUCOSE 103* 136* 102* 98  BUN 10 14 20 17   CREATININE 0.96 0.95 0.95 0.91  CALCIUM 8.7* 8.3* 8.6* 8.9   Liver Function Tests: No results for input(s): AST, ALT, ALKPHOS, BILITOT, PROT, ALBUMIN in the last 168 hours. No results for input(s): LIPASE, AMYLASE in the last 168 hours. No results for input(s): AMMONIA in the last 168 hours. CBC: Recent Labs  Lab 08/06/18 1008 08/07/18 0426 08/08/18 0420  WBC 9.6 13.5* 13.0*  NEUTROABS 8.5*  --  12.2*  HGB 12.4* 10.7* 10.7*  HCT 38.4* 31.8* 31.2*  MCV 93.7 90.6 88.6  PLT 274 217 229   Cardiac Enzymes: Recent Labs  Lab 08/06/18 1008  TROPONINI <0.03   BNP: Invalid input(s):  POCBNP CBG: No results for input(s): GLUCAP in the last 168 hours. D-Dimer No results for input(s): DDIMER in the last 72 hours. Hgb A1c No results for input(s): HGBA1C in the last 72 hours. Lipid Profile No results for input(s): CHOL, HDL, LDLCALC, TRIG, CHOLHDL, LDLDIRECT in the last 72 hours. Thyroid function studies No results for input(s): TSH, T4TOTAL, T3FREE, THYROIDAB in the last 72 hours.  Invalid input(s): FREET3 Anemia work up No results for input(s): VITAMINB12, FOLATE, FERRITIN, TIBC, IRON, RETICCTPCT in the last 72 hours. Urinalysis    Component Value Date/Time  COLORURINE YELLOW 07/23/2018 [place] 07/23/2018 0249   LABSPEC 1.010 07/23/2018 0249   LABSPEC 1.010 11/16/2006 1032   PHURINE 6.5 07/23/2018 0249   GLUCOSEU NEGATIVE 07/23/2018 0249   HGBUR NEGATIVE 07/23/2018 0249   BILIRUBINUR NEGATIVE 07/23/2018 0249   BILIRUBINUR Negative 11/16/2006 1032   KETONESUR 15 (A) 07/23/2018 0249   PROTEINUR NEGATIVE 07/23/2018 0249   UROBILINOGEN 0.2 11/25/2014 0510   NITRITE NEGATIVE 07/23/2018 0249   LEUKOCYTESUR NEGATIVE 07/23/2018 0249   LEUKOCYTESUR Trace 11/16/2006 1032   Sepsis Labs Invalid input(s): PROCALCITONIN,  WBC,  LACTICIDVEN Microbiology Recent Results (from the past 240 hour(s))  SARS Coronavirus 2 (CEPHEID- Performed in [place] hospital lab), Hosp Order     Status: None   Collection Time: 08/06/18 10:41 AM  Result Value Ref Range Status   SARS Coronavirus 2 NEGATIVE NEGATIVE Final    Comment: (NOTE) If result is NEGATIVE SARS-CoV-2 target nucleic acids are NOT DETECTED. The SARS-CoV-2 RNA is generally detectable in upper and lower  respiratory specimens during the acute phase of infection. The lowest  concentration of SARS-CoV-2 viral copies this assay can detect is 250  copies / mL. A negative result does not preclude SARS-CoV-2 infection  and should not be used as the sole basis for treatment or other  patient management  decisions.  A negative result may occur with  improper specimen collection / handling, submission of specimen other  than nasopharyngeal swab, presence of viral mutation(s) within the  areas targeted by this assay, and inadequate number of viral copies  (<250 copies / mL). A negative result must be combined with clinical  observations, patient history, and epidemiological information. If result is POSITIVE SARS-CoV-2 target nucleic acids are DETECTED. The SARS-CoV-2 RNA is generally detectable in upper and lower  respiratory specimens dur ing the acute phase of infection.  Positive  results are indicative of active infection with SARS-CoV-2.  Clinical  correlation with patient history and other diagnostic information is  necessary to determine patient infection status.  Positive results do  not rule out bacterial infection or co-infection with other viruses. If result is PRESUMPTIVE POSTIVE SARS-CoV-2 nucleic acids MAY BE PRESENT.   A presumptive positive result was obtained on the submitted specimen  and confirmed on repeat testing.  While 2019 novel coronavirus  (SARS-CoV-2) nucleic acids may be present in the submitted sample  additional confirmatory testing may be necessary for epidemiological  and / or clinical management purposes  to differentiate between  SARS-CoV-2 and other Sarbecovirus currently known to infect humans.  If clinically indicated additional testing with an alternate test  methodology [phone]) is advised. The SARS-CoV-2 RNA is generally  detectable in upper and lower respiratory sp ecimens during the acute  phase of infection. The expected result is Negative. Fact Sheet for Patients:  StrictlyIdeas.no Fact Sheet for Healthcare Providers: BankingDealers.co.za This test is not yet approved or cleared by the [place] FDA and has been authorized for detection and/or diagnosis of SARS-CoV-2 by FDA under an  Emergency Use Authorization (EUA).  This EUA will remain in effect (meaning this test can be used) for the duration of the COVID-19 declaration under Section 564(b)(1) of the Act, 21 U.S.C. section 360bbb-3(b)(1), unless the authorization is terminated or revoked sooner. Performed at [facility], [place] [address] [place], [place] [address]      Time coordinating discharge: 35 minutes  SIGNED:   Nicolette Bang, MD  [facility] 08/09/2018, 11:46 AM Pager   If  7PM-7AM, please contact night-coverage [email or web address] Password [email or web address]

## 2018-08-09 NOTE — Progress Notes (Signed)
[name] for warfarin Indication: atrial fibrillation  Allergies  Allergen Reactions  . Penicillins Shortness Of Breath and Rash    Has patient had a PCN reaction causing immediate rash, facial/tongue/throat swelling, SOB or lightheadedness with hypotension: Yes Has patient had a PCN reaction causing severe rash involving mucus membranes or skin necrosis: No Has patient had a PCN reaction that required hospitalization No Has patient had a PCN reaction occurring within the last 10 years: No If all of the above answers are "NO", then may proceed with Cephalosporin use.   . Tizanidine Shortness Of Breath    Light headed  . Ace Inhibitors     Has not tolerated in the past due to hyperkalemia  . Antihistamines, Diphenhydramine-Type Other (See Comments)    Inhibits urination  . Clemastine Fumarate Other (See Comments)    Inhibits urination  . Dimenhydrinate Other (See Comments)    Inhibits urination  . Amiodarone Other (See Comments)    Unknown    Labs: Recent Labs    08/06/18 1008  08/07/18 0426 08/08/18 0420 08/09/18 0623  HGB 12.4*  --  10.7* 10.7*  --   HCT 38.4*  --  31.8* 31.2*  --   PLT 274  --  217 229  --   LABPROT  --    < > 33.5* 28.5* 26.4*  INR  --    < > 3.4* 2.7* 2.5*  CREATININE 0.96  --  0.95 0.95 0.91  TROPONINI <0.03  --   --   --   --    < > = values in this interval not displayed.    Estimated Creatinine Clearance: 64.2 mL/min (by C-G formula based on SCr of 0.91 mg/dL).   Assessment: 30 yoM on warfarin PTA for hx AFib and stroke admitted with hypoxia. Pharmacy asked to resume warfarin.   INR is therapeutic at 2.5. Noted DDI with ciprofloxacin, pt may require slightly less warfarin weekly moving forward.  *Home Warfarin Dose = 7.5mg  Friday, 5mg  all other days  Goal of Therapy:  INR 2.5-3 Monitor platelets by anticoagulation protocol: Yes   Plan:  -Warfarin 4 mg PO x 1 tonight -Daily INR -Monitor for s/sx  of bleeding   Thank you for involving pharmacy in this patient's care.  Renold Genta, PharmD, BCPS Clinical Pharmacist Clinical phone for 08/09/2018 until 3p is [phone] 08/09/2018 8:09 AM  **Pharmacist phone directory can be found on [email or web address] listed under [place]**

## 2018-08-10 ENCOUNTER — Encounter: Payer: Self-pay | Admitting: *Deleted

## 2018-08-10 ENCOUNTER — Other Ambulatory Visit: Payer: Self-pay

## 2018-08-10 ENCOUNTER — Inpatient Hospital Stay (HOSPITAL_COMMUNITY)
Admission: EM | Admit: 2018-08-10 | Discharge: 2018-08-14 | DRG: 177 | Disposition: A | Discharge status: Hospice | Payer: Medicare Other | Attending: Internal Medicine | Admitting: Internal Medicine

## 2018-08-10 ENCOUNTER — Emergency Department (HOSPITAL_COMMUNITY): Payer: Medicare Other

## 2018-08-10 DIAGNOSIS — E871 Hypo-osmolality and hyponatremia: Secondary | ICD-10-CM | POA: Diagnosis present

## 2018-08-10 DIAGNOSIS — J189 Pneumonia, unspecified organism: Secondary | ICD-10-CM | POA: Diagnosis not present

## 2018-08-10 DIAGNOSIS — Z8249 Family history of ischemic heart disease and other diseases of the circulatory system: Secondary | ICD-10-CM

## 2018-08-10 DIAGNOSIS — Z9049 Acquired absence of other specified parts of digestive tract: Secondary | ICD-10-CM

## 2018-08-10 DIAGNOSIS — R627 Adult failure to thrive: Secondary | ICD-10-CM

## 2018-08-10 DIAGNOSIS — I5022 Chronic systolic (congestive) heart failure: Secondary | ICD-10-CM

## 2018-08-10 DIAGNOSIS — Z86711 Personal history of pulmonary embolism: Secondary | ICD-10-CM

## 2018-08-10 DIAGNOSIS — K219 Gastro-esophageal reflux disease without esophagitis: Secondary | ICD-10-CM | POA: Diagnosis present

## 2018-08-10 DIAGNOSIS — Z66 Do not resuscitate: Secondary | ICD-10-CM | POA: Diagnosis not present

## 2018-08-10 DIAGNOSIS — E038 Other specified hypothyroidism: Secondary | ICD-10-CM | POA: Diagnosis not present

## 2018-08-10 DIAGNOSIS — J80 Acute respiratory distress syndrome: Secondary | ICD-10-CM | POA: Diagnosis not present

## 2018-08-10 DIAGNOSIS — Z79899 Other long term (current) drug therapy: Secondary | ICD-10-CM

## 2018-08-10 DIAGNOSIS — I499 Cardiac arrhythmia, unspecified: Secondary | ICD-10-CM | POA: Diagnosis not present

## 2018-08-10 DIAGNOSIS — B370 Candidal stomatitis: Secondary | ICD-10-CM | POA: Diagnosis present

## 2018-08-10 DIAGNOSIS — M810 Age-related osteoporosis without current pathological fracture: Secondary | ICD-10-CM | POA: Diagnosis present

## 2018-08-10 DIAGNOSIS — Z515 Encounter for palliative care: Secondary | ICD-10-CM | POA: Diagnosis not present

## 2018-08-10 DIAGNOSIS — F0390 Unspecified dementia without behavioral disturbance: Secondary | ICD-10-CM | POA: Diagnosis present

## 2018-08-10 DIAGNOSIS — J9621 Acute and chronic respiratory failure with hypoxia: Secondary | ICD-10-CM | POA: Diagnosis present

## 2018-08-10 DIAGNOSIS — Z95810 Presence of automatic (implantable) cardiac defibrillator: Secondary | ICD-10-CM | POA: Diagnosis not present

## 2018-08-10 DIAGNOSIS — Z7982 Long term (current) use of aspirin: Secondary | ICD-10-CM

## 2018-08-10 DIAGNOSIS — G959 Disease of spinal cord, unspecified: Secondary | ICD-10-CM | POA: Diagnosis present

## 2018-08-10 DIAGNOSIS — Z7189 Other specified counseling: Secondary | ICD-10-CM

## 2018-08-10 DIAGNOSIS — Z8546 Personal history of malignant neoplasm of prostate: Secondary | ICD-10-CM

## 2018-08-10 DIAGNOSIS — R1310 Dysphagia, unspecified: Secondary | ICD-10-CM

## 2018-08-10 DIAGNOSIS — I2510 Atherosclerotic heart disease of native coronary artery without angina pectoris: Secondary | ICD-10-CM | POA: Diagnosis present

## 2018-08-10 DIAGNOSIS — I69391 Dysphagia following cerebral infarction: Secondary | ICD-10-CM

## 2018-08-10 DIAGNOSIS — R791 Abnormal coagulation profile: Secondary | ICD-10-CM | POA: Diagnosis present

## 2018-08-10 DIAGNOSIS — J449 Chronic obstructive pulmonary disease, unspecified: Secondary | ICD-10-CM | POA: Diagnosis present

## 2018-08-10 DIAGNOSIS — M1711 Unilateral primary osteoarthritis, right knee: Secondary | ICD-10-CM | POA: Diagnosis present

## 2018-08-10 DIAGNOSIS — Z880 Allergy status to penicillin: Secondary | ICD-10-CM

## 2018-08-10 DIAGNOSIS — R0902 Hypoxemia: Secondary | ICD-10-CM | POA: Diagnosis not present

## 2018-08-10 DIAGNOSIS — Z952 Presence of prosthetic heart valve: Secondary | ICD-10-CM

## 2018-08-10 DIAGNOSIS — Z888 Allergy status to other drugs, medicaments and biological substances status: Secondary | ICD-10-CM

## 2018-08-10 DIAGNOSIS — D72829 Elevated white blood cell count, unspecified: Secondary | ICD-10-CM | POA: Diagnosis not present

## 2018-08-10 DIAGNOSIS — Z7901 Long term (current) use of anticoagulants: Secondary | ICD-10-CM

## 2018-08-10 DIAGNOSIS — Z20828 Contact with and (suspected) exposure to other viral communicable diseases: Secondary | ICD-10-CM | POA: Diagnosis present

## 2018-08-10 DIAGNOSIS — J690 Pneumonitis due to inhalation of food and vomit: Principal | ICD-10-CM

## 2018-08-10 DIAGNOSIS — I4821 Permanent atrial fibrillation: Secondary | ICD-10-CM | POA: Diagnosis present

## 2018-08-10 DIAGNOSIS — Z87891 Personal history of nicotine dependence: Secondary | ICD-10-CM

## 2018-08-10 DIAGNOSIS — J9601 Acute respiratory failure with hypoxia: Secondary | ICD-10-CM | POA: Diagnosis not present

## 2018-08-10 DIAGNOSIS — I255 Ischemic cardiomyopathy: Secondary | ICD-10-CM | POA: Diagnosis present

## 2018-08-10 DIAGNOSIS — R0602 Shortness of breath: Secondary | ICD-10-CM | POA: Diagnosis not present

## 2018-08-10 DIAGNOSIS — Z79890 Hormone replacement therapy (postmenopausal): Secondary | ICD-10-CM

## 2018-08-10 DIAGNOSIS — I482 Chronic atrial fibrillation, unspecified: Secondary | ICD-10-CM | POA: Diagnosis present

## 2018-08-10 DIAGNOSIS — N400 Enlarged prostate without lower urinary tract symptoms: Secondary | ICD-10-CM | POA: Diagnosis present

## 2018-08-10 DIAGNOSIS — I252 Old myocardial infarction: Secondary | ICD-10-CM

## 2018-08-10 DIAGNOSIS — Z923 Personal history of irradiation: Secondary | ICD-10-CM

## 2018-08-10 DIAGNOSIS — I472 Ventricular tachycardia: Secondary | ICD-10-CM | POA: Diagnosis present

## 2018-08-10 DIAGNOSIS — R1313 Dysphagia, pharyngeal phase: Secondary | ICD-10-CM | POA: Diagnosis present

## 2018-08-10 DIAGNOSIS — M1990 Unspecified osteoarthritis, unspecified site: Secondary | ICD-10-CM | POA: Diagnosis present

## 2018-08-10 DIAGNOSIS — Z6821 Body mass index (BMI) 21.0-21.9, adult: Secondary | ICD-10-CM

## 2018-08-10 DIAGNOSIS — Z951 Presence of aortocoronary bypass graft: Secondary | ICD-10-CM

## 2018-08-10 DIAGNOSIS — N183 Chronic kidney disease, stage 3 (moderate): Secondary | ICD-10-CM | POA: Diagnosis present

## 2018-08-10 DIAGNOSIS — E785 Hyperlipidemia, unspecified: Secondary | ICD-10-CM | POA: Diagnosis present

## 2018-08-10 DIAGNOSIS — E039 Hypothyroidism, unspecified: Secondary | ICD-10-CM | POA: Diagnosis present

## 2018-08-10 LAB — CBC
HCT: 35.8 % — ABNORMAL LOW (ref 39.0–52.0)
Hemoglobin: 11.8 g/dL — ABNORMAL LOW (ref 13.0–17.0)
MCH: 30.6 pg (ref 26.0–34.0)
MCHC: 33 g/dL (ref 30.0–36.0)
MCV: 93 fL (ref 80.0–100.0)
Platelets: 231 10*3/uL (ref 150–400)
RBC: 3.85 MIL/uL — ABNORMAL LOW (ref 4.22–5.81)
RDW: 13.5 % (ref 11.5–15.5)
WBC: 10.8 10*3/uL — ABNORMAL HIGH (ref 4.0–10.5)
nRBC: 0 % (ref 0.0–0.2)

## 2018-08-10 LAB — PROTIME-INR
INR: 2.6 — ABNORMAL HIGH (ref 0.8–1.2)
Prothrombin Time: 27.5 seconds — ABNORMAL HIGH (ref 11.4–15.2)

## 2018-08-10 LAB — BASIC METABOLIC PANEL
Anion gap: 13 (ref 5–15)
BUN: 16 mg/dL (ref 8–23)
CO2: 24 mmol/L (ref 22–32)
Calcium: 8.6 mg/dL — ABNORMAL LOW (ref 8.9–10.3)
Chloride: 90 mmol/L — ABNORMAL LOW (ref 98–111)
Creatinine, Ser: 1.04 mg/dL (ref 0.61–1.24)
GFR calc Af Amer: >60 mL/min (ref >60)
GFR calc non Af Amer: >60 mL/min (ref >60)
Glucose, Bld: 86 mg/dL (ref 70–99)
Potassium: 4.4 mmol/L (ref 3.5–5.1)
Sodium: 127 mmol/L — ABNORMAL LOW (ref 135–145)

## 2018-08-10 LAB — SARS CORONAVIRUS 2 BY RT PCR (HOSPITAL ORDER, PERFORMED IN ~~LOC~~ HOSPITAL LAB): SARS Coronavirus 2: NEGATIVE

## 2018-08-10 MED ORDER — ALBUTEROL SULFATE (2.5 MG/3ML) 0.083% IN NEBU: 2.5000 mg | INHALATION_SOLUTION | Freq: Four times a day (QID) | RESPIRATORY_TRACT | Status: DC | PRN

## 2018-08-10 MED ORDER — SODIUM CHLORIDE 0.9% FLUSH
3.0000 mL | Freq: Two times a day (BID) | INTRAVENOUS | Status: DC
  Administered 2018-08-10 – 2018-08-14 (×8): 3 mL via INTRAVENOUS

## 2018-08-10 MED ORDER — ONDANSETRON HCL 4 MG PO TABS: 4.0000 mg | ORAL_TABLET | Freq: Four times a day (QID) | ORAL | Status: DC | PRN

## 2018-08-10 MED ORDER — ASPIRIN EC 81 MG PO TBEC
81.0000 mg | DELAYED_RELEASE_TABLET | Freq: Every day | ORAL | Status: DC
  Administered 2018-08-11 – 2018-08-14 (×4): 81 mg via ORAL
  Filled 2018-08-10 (×4): qty 1

## 2018-08-10 MED ORDER — FAMOTIDINE 20 MG PO TABS
40.0000 mg | ORAL_TABLET | Freq: Every day | ORAL | Status: DC
  Administered 2018-08-10 – 2018-08-12 (×3): 40 mg via ORAL
  Filled 2018-08-10 (×5): qty 2

## 2018-08-10 MED ORDER — WARFARIN SODIUM 5 MG PO TABS
5.0000 mg | ORAL_TABLET | Freq: Once | ORAL | Status: AC
  Administered 2018-08-10: 5 mg via ORAL
  Filled 2018-08-10: qty 1

## 2018-08-10 MED ORDER — MEXILETINE HCL 150 MG PO CAPS
150.0000 mg | ORAL_CAPSULE | Freq: Two times a day (BID) | ORAL | Status: DC
  Administered 2018-08-10 – 2018-08-14 (×8): 150 mg via ORAL
  Filled 2018-08-10 (×8): qty 1

## 2018-08-10 MED ORDER — CLINDAMYCIN PHOSPHATE 300 MG/50ML IV SOLN
300.0000 mg | Freq: Three times a day (TID) | INTRAVENOUS | Status: DC
  Administered 2018-08-10 – 2018-08-13 (×8): 300 mg via INTRAVENOUS
  Filled 2018-08-10 (×11): qty 50

## 2018-08-10 MED ORDER — ALBUTEROL SULFATE (2.5 MG/3ML) 0.083% IN NEBU
2.5000 mg | INHALATION_SOLUTION | Freq: Four times a day (QID) | RESPIRATORY_TRACT | Status: DC
  Administered 2018-08-10 – 2018-08-12 (×6): 2.5 mg via RESPIRATORY_TRACT
  Filled 2018-08-10 (×5): qty 3

## 2018-08-10 MED ORDER — IPRATROPIUM BROMIDE 0.06 % NA SOLN
2.0000 | Freq: Two times a day (BID) | NASAL | Status: DC
  Administered 2018-08-11 – 2018-08-14 (×7): 2 via NASAL
  Filled 2018-08-10 (×2): qty 30
  Filled 2018-08-10: qty 15

## 2018-08-10 MED ORDER — ACETAMINOPHEN 325 MG PO TABS
650.0000 mg | ORAL_TABLET | Freq: Four times a day (QID) | ORAL | Status: DC | PRN
  Administered 2018-08-12: 650 mg via ORAL
  Filled 2018-08-10: qty 2

## 2018-08-10 MED ORDER — CYCLOSPORINE 0.05 % OP EMUL
1.0000 [drp] | Freq: Two times a day (BID) | OPHTHALMIC | Status: DC | PRN
  Filled 2018-08-10: qty 30

## 2018-08-10 MED ORDER — FUROSEMIDE 20 MG PO TABS: 20.0000 mg | ORAL_TABLET | Freq: Every day | ORAL | Status: DC | PRN

## 2018-08-10 MED ORDER — ALFUZOSIN HCL ER 10 MG PO TB24
10.0000 mg | ORAL_TABLET | Freq: Every day | ORAL | Status: DC
  Administered 2018-08-10 – 2018-08-13 (×4): 10 mg via ORAL
  Filled 2018-08-10 (×4): qty 1

## 2018-08-10 MED ORDER — LIDOCAINE 5 % EX PTCH: 1.0000 | MEDICATED_PATCH | Freq: Every day | CUTANEOUS | Status: DC | PRN

## 2018-08-10 MED ORDER — MOMETASONE FURO-FORMOTEROL FUM 100-5 MCG/ACT IN AERO
2.0000 | INHALATION_SPRAY | Freq: Two times a day (BID) | RESPIRATORY_TRACT | Status: DC
  Administered 2018-08-10 – 2018-08-14 (×8): 2 via RESPIRATORY_TRACT
  Filled 2018-08-10: qty 8.8

## 2018-08-10 MED ORDER — WARFARIN - PHARMACIST DOSING INPATIENT
Freq: Every day | Status: DC
  Administered 2018-08-11: 18:00:00

## 2018-08-10 MED ORDER — BUDESONIDE-FORMOTEROL FUMARATE 80-4.5 MCG/ACT IN AERO
2.0000 | INHALATION_SPRAY | Freq: Two times a day (BID) | RESPIRATORY_TRACT | Status: DC
  Filled 2018-08-10: qty 6.9

## 2018-08-10 MED ORDER — LEVOTHYROXINE SODIUM 25 MCG PO TABS
125.0000 ug | ORAL_TABLET | Freq: Every day | ORAL | Status: DC
  Administered 2018-08-11 – 2018-08-14 (×3): 125 ug via ORAL
  Filled 2018-08-10 (×3): qty 1

## 2018-08-10 MED ORDER — LINACLOTIDE 145 MCG PO CAPS
145.0000 ug | ORAL_CAPSULE | Freq: Every day | ORAL | Status: DC
  Administered 2018-08-11 – 2018-08-14 (×4): 145 ug via ORAL
  Filled 2018-08-10 (×4): qty 1

## 2018-08-10 MED ORDER — ONDANSETRON HCL 4 MG/2ML IJ SOLN: 4.0000 mg | Freq: Four times a day (QID) | INTRAMUSCULAR | Status: DC | PRN

## 2018-08-10 MED ORDER — SACCHAROMYCES BOULARDII 250 MG PO CAPS
250.0000 mg | ORAL_CAPSULE | Freq: Every day | ORAL | Status: DC
  Administered 2018-08-11 – 2018-08-14 (×4): 250 mg via ORAL
  Filled 2018-08-10 (×4): qty 1

## 2018-08-10 MED ORDER — CARVEDILOL 6.25 MG PO TABS
6.2500 mg | ORAL_TABLET | Freq: Two times a day (BID) | ORAL | Status: DC
  Administered 2018-08-10 – 2018-08-14 (×8): 6.25 mg via ORAL
  Filled 2018-08-10 (×8): qty 1

## 2018-08-10 MED ORDER — CLINDAMYCIN HCL 150 MG PO CAPS: 300.0000 mg | ORAL_CAPSULE | Freq: Three times a day (TID) | ORAL | Status: DC

## 2018-08-10 MED ORDER — TRAMADOL HCL 50 MG PO TABS: 50.0000 mg | ORAL_TABLET | Freq: Four times a day (QID) | ORAL | Status: DC | PRN

## 2018-08-10 MED ORDER — ACETAMINOPHEN 650 MG RE SUPP: 650.0000 mg | Freq: Four times a day (QID) | RECTAL | Status: DC | PRN

## 2018-08-10 NOTE — Discharge Planning (Signed)
[facility] consulted regarding DME oxygen for home use.  EDCM reviewed chart to find the DME oxygen was not ordered and qualifiing O2 sats were not present.  Pt will need and order and qualifiyng sats in an observation setting or from PCP office.

## 2018-08-10 NOTE — ED Notes (Signed)
ED TO INPATIENT HANDOFF REPORT  ED Nurse Name and Phone #: [place], RN [phone]  S Name/Age/Gender [name] 85 y.o. male Room/Bed: 036C/036C  Code Status   Code Status: Full Code  Home/SNF/Other Home Patient oriented to: self, place, time and situation Is this baseline? Yes   Triage Complete: Triage complete  Chief Complaint SOB  Triage Note Pt brought in by [facility] from home for SOB, pt recently diagnosed with pneumonia. Pt was on O2 in the hospital, but not discharged with it. Pt 78% on RA on EMS arrival. Pt 99% on 6L, diminished in all lung fields per EMS, significant on the right side. Per family waiting on prescription for home O2.    Allergies Allergies  Allergen Reactions  . Penicillins Shortness Of Breath and Rash    Has patient had a PCN reaction causing immediate rash, facial/tongue/throat swelling, SOB or lightheadedness with hypotension: Yes Has patient had a PCN reaction causing severe rash involving mucus membranes or skin necrosis: No Has patient had a PCN reaction that required hospitalization No Has patient had a PCN reaction occurring within the last 10 years: No If all of the above answers are "NO", then may proceed with Cephalosporin use.   . Tizanidine Shortness Of Breath    Light headed  . Ace Inhibitors     Has not tolerated in the past due to hyperkalemia  . Antihistamines, Diphenhydramine-Type Other (See Comments)    Inhibits urination  . Clemastine Fumarate Other (See Comments)    Inhibits urination  . Dimenhydrinate Other (See Comments)    Inhibits urination  . Amiodarone Other (See Comments)    Unknown reaction    Level of Care/Admitting Diagnosis ED Disposition    ED Disposition Condition [facility]: [place] [[id number]]  Level of Care: Telemetry Medical [104]  I expect the patient will be discharged within 24 hours: Yes  LOW acuity---Tx typically complete <24 hrs---ACUTE conditions typically can  be evaluated <24 hours---LABS likely to return to acceptable levels <24 hours---IS near functional baseline---EXPECTED to return to current living arrangement---NOT newly hypoxic: Meets criteria for 5C-Observation unit  Covid Evaluation: Screening Protocol (No Symptoms)  Diagnosis: Acute respiratory failure with hypoxia [name]) [[id number]]  Admitting Physician: Norval Morton [[id number]]  Attending Physician: Norval Morton [[id number]]  PT Class (Do Not Modify): Observation [104]  PT Acc Code (Do Not Modify): Observation [10022]       B Medical/Surgery History Past Medical History:  Diagnosis Date  . Aortic stenosis, severe    WITH PERCUTANEOUS AORTIC VALVE (TAVI) IN March 2012 at the [facility]  . Aspiration into airway   . Cervical myelopathy ([place])   . Chronic anticoagulation    on coumadin  . Chronic systolic CHF (congestive heart failure) ([place])   . Coronary artery disease    a.  s/p CABG 1979 and redo 1991.  Marland Kitchen Coughing   . Diverticulitis    CURRENTLY CONTROLLED WITH NO EVIDENCE OF RECURRENT INFECTION  . Esophageal stricture    WITH DILATATION  . Frailty   . GERD (gastroesophageal reflux disease)   . Heart murmur   . Hyperlipidemia   . Hyponatremia   . Ischemic cardiomyopathy 05/2010   Has EF of 25%  . MI (myocardial infarction) ([place]) 1974, 1978  . Osteoarthritis    RIGHT KNEE  . Osteoporosis   . Permanent atrial fibrillation    Has not tolerated amiodarone in the past. Amiodarone was stopped in September  of 2010 due to side effects.  . Presence of cardioverter defibrillator   . Prostate cancer ([place])   . Pulmonary embolism ([place])   . Stroke [facility])    3 strokes  . VT (ventricular tachycardia) ([place])    Past Surgical History:  Procedure Laterality Date  . AORTIC VALVE REPLACEMENT     Percutaneous AVR in March 2012 at the [facility]  . BACK SURGERY    . CARDIAC CATHETERIZATION  2010   SEVERE LV DYSFUNCTION WITH ESTIMATED EJECTION FRACTION OF 25%  .  CARDIOVERSION  02/16/2011   Procedure: CARDIOVERSION;  Surgeon: Carlena Bjornstad, MD;  Location: [facility];  Service: Cardiovascular;  Laterality: N/A;  . CHOLECYSTECTOMY    . CORONARY ARTERY BYPASS GRAFT  1979  . CORONARY ARTERY BYPASS GRAFT  1991   REDO SURGERY  . ICD  Feb 2003; 02/2013   gen change 02-11-2013 by Dr Caryl Comes  . IMPLANTABLE CARDIOVERTER DEFIBRILLATOR (ICD) GENERATOR CHANGE N/A 02/10/2013   Procedure: ICD GENERATOR CHANGE;  Surgeon: Deboraha Sprang, MD;  Location[facility] LAB;  Service: Cardiovascular;  Laterality: N/A;  . SHOULDER SURGERY       A IV Location/Drains/Wounds Patient Lines/Drains/Airways Status   Active Line/Drains/Airways    Name:   Placement date:   Placement time:   Site:   Days:   Pressure Injury 01/21/17 Stage I -  Intact skin with non-blanchable redness of a localized area usually over a bony prominence.    01/21/17    0743     566   Pressure Injury 01/21/17 Stage I -  Intact skin with non-blanchable redness of a localized area usually over a bony prominence.    01/21/17    0743     566   Pressure Injury 08/06/18 Stage I -  Intact skin with non-blanchable redness of a localized area usually over a bony prominence.   08/06/18    1527     4   Pressure Injury 08/06/18 Stage II -  Partial thickness loss of dermis presenting as a shallow open ulcer with a red, pink wound bed without slough.   08/06/18    1529     4   Wound / Incision (Open or Dehisced) 01/21/17 Non-pressure wound Leg Anterior;Lower;Right;Left non-painful, no swelling   01/21/17    0743    Leg   566   Wound / Incision (Open or Dehisced) 01/21/17 Other (Comment) Ankle Left;Lateral non-tender, no drainage or swelling; foam dressing applied   01/21/17    0743    Ankle   566   Wound / Incision (Open or Dehisced) 07/06/18 Non-pressure wound Buttocks Medial   07/06/18    2045    Buttocks   35          Intake/Output Last 24 hours No intake or output data in the 24 hours ending 08/10/18  1737  Labs/Imaging Results for orders placed or performed during the hospital encounter of 08/10/18 (from the past 48 hour(s))  CBC     Status: Abnormal   Collection Time: 08/10/18  1:23 PM  Result Value Ref Range   WBC 10.8 (H) 4.0 - 10.5 K/uL   RBC 3.85 (L) 4.22 - 5.81 MIL/uL   Hemoglobin 11.8 (L) 13.0 - 17.0 g/dL   HCT 35.8 (L) 39.0 - 52.0 %   MCV 93.0 80.0 - 100.0 fL   MCH 30.6 26.0 - 34.0 pg   MCHC 33.0 30.0 - 36.0 g/dL   RDW 13.5 11.5 - 15.5 %  Platelets 231 150 - 400 K/uL   nRBC 0.0 0.0 - 0.2 %    Comment: Performed at [facility], [place] [address], [place], [place] [address]  Basic metabolic panel     Status: Abnormal   Collection Time: 08/10/18  1:23 PM  Result Value Ref Range   Sodium 127 (L) 135 - 145 mmol/L   Potassium 4.4 3.5 - 5.1 mmol/L   Chloride 90 (L) 98 - 111 mmol/L   CO2 24 22 - 32 mmol/L   Glucose, Bld 86 70 - 99 mg/dL   BUN 16 8 - 23 mg/dL   Creatinine, Ser 1.04 0.61 - 1.24 mg/dL   Calcium 8.6 (L) 8.9 - 10.3 mg/dL   GFR calc non Af Amer >60 >60 mL/min   GFR calc Af Amer >60 >60 mL/min   Anion gap 13 5 - 15    Comment: Performed at [place] [address], [place], [place] [address]  Protime-INR     Status: Abnormal   Collection Time: 08/10/18  1:28 PM  Result Value Ref Range   Prothrombin Time 27.5 (H) 11.4 - 15.2 seconds   INR 2.6 (H) 0.8 - 1.2    Comment: (NOTE) INR goal varies based on device and disease states. Performed at [facility], [place] [address] [place], [place] [address]   SARS Coronavirus 2 (CEPHEID - Performed in [place] hospital lab), Hosp Order     Status: None   Collection Time: 08/10/18  4:11 PM  Result Value Ref Range   SARS Coronavirus 2 NEGATIVE NEGATIVE    Comment: (NOTE) If result is NEGATIVE SARS-CoV-2 target nucleic acids are NOT DETECTED. The SARS-CoV-2 RNA is generally detectable in upper and lower  respiratory specimens during the acute phase of infection. The lowest  concentration of  SARS-CoV-2 viral copies this assay can detect is 250  copies / mL. A negative result does not preclude SARS-CoV-2 infection  and should not be used as the sole basis for treatment or other  patient management decisions.  A negative result may occur with  improper specimen collection / handling, submission of specimen other  than nasopharyngeal swab, presence of viral mutation(s) within the  areas targeted by this assay, and inadequate number of viral copies  (<250 copies / mL). A negative result must be combined with clinical  observations, patient history, and epidemiological information. If result is POSITIVE SARS-CoV-2 target nucleic acids are DETECTED. The SARS-CoV-2 RNA is generally detectable in upper and lower  respiratory specimens dur ing the acute phase of infection.  Positive  results are indicative of active infection with SARS-CoV-2.  Clinical  correlation with patient history and other diagnostic information is  necessary to determine patient infection status.  Positive results do  not rule out bacterial infection or co-infection with other viruses. If result is PRESUMPTIVE POSTIVE SARS-CoV-2 nucleic acids MAY BE PRESENT.   A presumptive positive result was obtained on the submitted specimen  and confirmed on repeat testing.  While 2019 novel coronavirus  (SARS-CoV-2) nucleic acids may be present in the submitted sample  additional confirmatory testing may be necessary for epidemiological  and / or clinical management purposes  to differentiate between  SARS-CoV-2 and other Sarbecovirus currently known to infect humans.  If clinically indicated additional testing with an alternate test  methodology [phone]) is advised. The SARS-CoV-2 RNA is generally  detectable in upper and lower respiratory sp ecimens during the acute  phase of infection. The  expected result is Negative. Fact Sheet for Patients:  StrictlyIdeas.no Fact Sheet for Healthcare  Providers: BankingDealers.co.za This test is not yet approved or cleared by the [place] FDA and has been authorized for detection and/or diagnosis of SARS-CoV-2 by FDA under an Emergency Use Authorization (EUA).  This EUA will remain in effect (meaning this test can be used) for the duration of the COVID-19 declaration under Section 564(b)(1) of the Act, 21 U.S.C. section 360bbb-3(b)(1), unless the authorization is terminated or revoked sooner. Performed at [facility], [place] [address], [place], [place] [address]    *Note: Due to a large number of results and/or encounters for the requested time period, some results have not been displayed. A complete set of results can be found in Results Review.   Dg Chest Portable 1 View  Result Date: 08/10/2018 CLINICAL DATA:  Short of breath EXAM: PORTABLE CHEST 1 VIEW COMPARISON:  08/06/2018 FINDINGS: Left subclavian AICD device is stable. Aortic valvuloplasty hardware remains in place. Airspace disease throughout the right lung is not significantly changed. Patchy airspace disease at the left base is increased. No pneumothorax. Normal heart size. IMPRESSION: Airspace disease throughout the right lung is not significantly changed. Increased airspace disease at the left lung base. Electronically Signed   By: Marybelle Killings M.D.   On: 08/10/2018 13:53    Pending Labs Unresulted Labs (From admission, onward)    Start     Ordered   08/11/18 0500  Protime-INR  Daily,   R     08/10/18 1706          Vitals/Pain Today's Vitals   08/10/18 1254 08/10/18 1345 08/10/18 1400  BP: (!) 114/92 111/65 (!) 149/119  Pulse: 96 (!) 119 100  Resp: (!) 22 (!) 21 18  Temp: 99.6 F (37.6 C)    TempSrc: Oral    SpO2: 98% 99% 98%    Isolation Precautions No active isolations  Medications Medications  traMADol (ULTRAM) tablet 50 mg (has no administration in time range)  aspirin tablet 81 mg (has no administration in time  range)  levothyroxine (SYNTHROID) tablet 125 mcg (has no administration in time range)  carvedilol (COREG) tablet 6.25 mg (has no administration in time range)  furosemide (LASIX) tablet 20 mg (has no administration in time range)  mexiletine (MEXITIL) capsule 150 mg (has no administration in time range)  famotidine (PEPCID) tablet 40 mg (has no administration in time range)  alfuzosin (UROXATRAL) 24 hr tablet 10 mg (has no administration in time range)  budesonide-formoterol (SYMBICORT) 80-4.5 MCG/ACT inhaler 2 puff (has no administration in time range)  cycloSPORINE (RESTASIS) 0.05 % ophthalmic emulsion 1 drop (has no administration in time range)  lidocaine (LIDODERM) 5 % 1 patch (has no administration in time range)  ipratropium (ATROVENT) 0.03 % nasal spray 2 spray (has no administration in time range)  Probiotic Daily CAPS 1 capsule (has no administration in time range)  linaclotide (LINZESS) capsule 145 mcg (has no administration in time range)  sodium chloride flush (NS) 0.9 % injection 3 mL (has no administration in time range)  ondansetron (ZOFRAN) tablet 4 mg (has no administration in time range)    Or  ondansetron (ZOFRAN) injection 4 mg (has no administration in time range)  acetaminophen (TYLENOL) tablet 650 mg (has no administration in time range)    Or  acetaminophen (TYLENOL) suppository 650 mg (has no administration in time range)  clindamycin (CLEOCIN) IVPB 300 mg (has no administration in time range)  Warfarin -  Pharmacist Dosing Inpatient (has no administration in time range)  albuterol (PROVENTIL) (2.5 MG/3ML) 0.083% nebulizer solution 2.5 mg (has no administration in time range)  warfarin (COUMADIN) tablet 5 mg (has no administration in time range)    Mobility walks with person assist High fall risk   Focused Assessments Pulmonary Assessment Handoff:  Lung sounds: L Breath Sounds: Diminished O2 Device: Nasal Cannula O2 Flow Rate (L/min): 4  L/min      R Recommendations: See Admitting Provider Note  Report given to:   Additional Notes:

## 2018-08-10 NOTE — ED Provider Notes (Signed)
[facility] EMERGENCY DEPARTMENT Provider Note   CSN: [id number] Arrival date & time: 08/10/18  1246    History   Chief Complaint Chief Complaint  Patient presents with  . Shortness of Breath    HPI [name] is a 85 y.o. male.     HPI Patient presents with shortness of breath.  Comes from home.  Left hospital yesterday after aspiration pneumonia.  Appearing from the notes may have been attempting get oxygen at home.  Skilled nursing recommended but family had refused.  Occasional cough.  Some tightness in his upper chest.  States is really not changed.  Had room air sats of 78% on room air.  Family is reportedly waiting for oxygen but does not have it yet.  No fevers.  No swelling in his legs.  Apparently is a very high risk for aspiration. Past Medical History:  Diagnosis Date  . Aortic stenosis, severe    WITH PERCUTANEOUS AORTIC VALVE (TAVI) IN March 2012 at the [facility]  . Aspiration into airway   . Cervical myelopathy ([place])   . Chronic anticoagulation    on coumadin  . Chronic systolic CHF (congestive heart failure) ([place])   . Coronary artery disease    a.  s/p CABG 1979 and redo 1991.  Marland Kitchen Coughing   . Diverticulitis    CURRENTLY CONTROLLED WITH NO EVIDENCE OF RECURRENT INFECTION  . Esophageal stricture    WITH DILATATION  . Frailty   . GERD (gastroesophageal reflux disease)   . Heart murmur   . Hyperlipidemia   . Hyponatremia   . Ischemic cardiomyopathy 05/2010   Has EF of 25%  . MI (myocardial infarction) ([place]) 1974, 1978  . Osteoarthritis    RIGHT KNEE  . Osteoporosis   . Permanent atrial fibrillation    Has not tolerated amiodarone in the past. Amiodarone was stopped in September of 2010 due to side effects.  . Presence of cardioverter defibrillator   . Prostate cancer ([place])   . Pulmonary embolism ([name])   . Stroke [facility])    3 strokes  . VT (ventricular tachycardia) [facility])     Patient Active Problem List   Diagnosis Date  Noted  . Acute respiratory failure with hypoxia ([place]) 08/10/2018  . Dysphagia 08/10/2018  . Leukocytosis 08/10/2018  . Acute respiratory failure ([place]) 08/06/2018  . Aspiration pneumonia ([place]) 08/06/2018  . Dehydration 07/22/2018  . Ventricular tachyarrhythmia ([place]) 07/06/2018  . Syncope 07/06/2018  . Pressure injury of skin 01/21/2017  . Acute urinary retention 01/21/2017  . Shortness of breath   . Weakness 09/01/2015  . Hyponatremia 09/01/2015  . Hypothyroidism 09/01/2015  . DOE (dyspnea on exertion) 09/01/2015  . Acute on chronic systolic congestive heart failure ([place]) 09/01/2015  . Exertional dyspnea 09/01/2015  . GI bleed 01/16/2014  . GIB (gastrointestinal bleeding) 01/16/2014  . Encounter for therapeutic drug monitoring 05/09/2013  . Automatic implantable cardioverter-defibrillator in situ 12/13/2012  . Rectal bleed 05/30/2012    Class: Acute  . Acute low back pain 05/30/2012    Class: Acute  . Hypotension 05/30/2012    Class: Acute  . Vertebral fracture, osteoporotic ([place]) 05/21/2012  . Gait instability 12/02/2011    Class: Acute  . Atrial fibrillation, chronic 12/02/2011    Class: Chronic  . Anticoagulated on Coumadin 12/02/2011  . Deformity, finger acquired 12/19/2010  . Chronic cough 12/05/2010  . S/P aortic valve replacement 10/22/2010  . Ischemic cardiomyopathy   . Diverticulitis   . Prostate cancer (  [place])   . Osteoarthritis   . Cervical myelopathy ([place])   . Pulmonary embolism ([place])   . Hyperlipidemia   . GERD (gastroesophageal reflux disease)   . Esophageal stricture   . Indigestion 06/29/2010  . VENTRICULAR TACHYCARDIA 01/15/2009    Past Surgical History:  Procedure Laterality Date  . AORTIC VALVE REPLACEMENT     Percutaneous AVR in March 2012 at the [facility]  . BACK SURGERY    . CARDIAC CATHETERIZATION  2010   SEVERE LV DYSFUNCTION WITH ESTIMATED EJECTION FRACTION OF 25%  . CARDIOVERSION  02/16/2011   Procedure: CARDIOVERSION;  Surgeon:  Carlena Bjornstad, MD;  Location: [facility];  Service: Cardiovascular;  Laterality: N/A;  . CHOLECYSTECTOMY    . CORONARY ARTERY BYPASS GRAFT  1979  . CORONARY ARTERY BYPASS GRAFT  1991   REDO SURGERY  . ICD  Feb 2003; 02/2013   gen change 02-11-2013 by Dr Caryl Comes  . IMPLANTABLE CARDIOVERTER DEFIBRILLATOR (ICD) GENERATOR CHANGE N/A 02/10/2013   Procedure: ICD GENERATOR CHANGE;  Surgeon: Deboraha Sprang, MD;  Location: [facility] CATH LAB;  Service: Cardiovascular;  Laterality: N/A;  . SHOULDER SURGERY          Home Medications    Prior to Admission medications   Medication Sig Start Date End Date Taking? Authorizing Provider  alfuzosin (UROXATRAL) 10 MG 24 hr tablet Take 10 mg by mouth at bedtime.    Yes [provider]  aspirin EC 81 MG tablet Take 81 mg by mouth daily.   Yes [provider]  benzonatate (TESSALON) 200 MG capsule Take 200 mg by mouth 3 (three) times daily as needed for cough.    Yes [provider]  budesonide-formoterol (SYMBICORT) 160-4.5 MCG/ACT inhaler Inhale 2 puffs into the lungs 2 (two) times daily.   Yes [provider]  carvedilol (COREG) 6.25 MG tablet TAKE 1 TABLET BY MOUTH TWICE DAILY WITH A MEAL. Patient taking differently: Take 6.25 mg by mouth 2 (two) times daily with a meal.  07/01/18  Yes Sherren Mocha, MD  cholecalciferol (VITAMIN D) 1000 UNITS tablet Take 1,000 Units by mouth 2 (two) times daily.   Yes [provider]  clindamycin (CLEOCIN) 300 MG capsule Take 1 capsule (300 mg total) by mouth 3 (three) times daily for 4 days. 08/09/18 08/13/18 Yes Spongberg, Audie Pinto, MD  cycloSPORINE (RESTASIS) 0.05 % ophthalmic emulsion Place 1 drop into both eyes 2 (two) times daily as needed (dry eyes).   Yes [provider]  famotidine (PEPCID) 40 MG tablet Take 40 mg by mouth daily.   Yes [provider]  feeding supplement, ENSURE ENLIVE, (ENSURE ENLIVE) LIQD Take 237 mLs by mouth 2 (two) times daily between meals.  07/24/18  Yes Mercy Riding, MD  furosemide (LASIX) 20 MG tablet TAKE (1) TABLET DAILY AS NEEDED FOR SWELLING. Patient taking differently: Take 20 mg by mouth at bedtime.  06/19/16  Yes Sherren Mocha, MD  ipratropium (ATROVENT) 0.03 % nasal spray Place 2 sprays into both nostrils 2 (two) times daily.   Yes [provider]  levothyroxine (SYNTHROID) 125 MCG tablet Take 1 tablet (125 mcg total) by mouth daily before breakfast. 09/02/15  Yes Hosie Poisson, MD  lidocaine (LIDODERM) 5 % Place 1 patch onto the skin daily as needed (pain). Remove & Discard patch within 12 hours or as directed by MD   Yes [provider]  linaclotide (LINZESS) 145 MCG CAPS capsule Take 145 mcg by mouth daily before breakfast.  Yes [provider]  mexiletine (MEXITIL) 150 MG capsule Take 1 capsule (150 mg total) by mouth 2 (two) times a day. 07/07/18  Yes Cheryln Manly, NP  nitroGLYCERIN (NITROSTAT) 0.4 MG SL tablet Place 1 tablet (0.4 mg total) under the tongue every 5 (five) minutes x 3 doses as needed for chest pain (Max 3 doses in 15 min. Call 911). 12/06/16  Yes Sherren Mocha, MD  omeprazole (PRILOSEC) 20 MG capsule Take 20 mg by mouth daily as needed (acid reflux).   Yes [provider]  polyethylene glycol (MIRALAX / GLYCOLAX) packet Take 17 g by mouth daily as needed for mild constipation.    Yes [provider]  Probiotic Product (PROBIOTIC DAILY PO) Take 1 capsule by mouth daily.   Yes [provider]  traMADol (ULTRAM) 50 MG tablet Take 50 mg by mouth every 6 (six) hours as needed for moderate pain.   Yes [provider]  warfarin (COUMADIN) 5 MG tablet Take 1-1.5 tablets (5-7.5 mg total) by mouth See admin instructions. Take 7.5 mg by mouth only on Fridays; all other days take 5mg  Patient taking differently: Take 5-7.5 mg by mouth See admin instructions. Take  1 1/2 tablet (7.5 mg) by mouth only on Friday evenings; take one tablet (5 mg) on all  other evenings 07/07/18  Yes Cheryln Manly, NP    Family History Family History  Problem Relation Age of Onset  . Heart disease Mother 71  . Diabetes Mother 68  . Heart disease Father 33    Social History Social History   Tobacco Use  . Smoking status: Former Smoker    Packs/day: 4.00    Years: 15.00    Pack years: 60.00    Types: Cigarettes    Last attempt to quit: 06/15/1969    Years since quitting: 49.1  . Smokeless tobacco: Never Used  Substance Use Topics  . Alcohol use: No  . Drug use: No     Allergies   Penicillins; Tizanidine; Ace inhibitors; Antihistamines, diphenhydramine-type; Clemastine fumarate; Dimenhydrinate; and Amiodarone   Review of Systems Review of Systems  Constitutional: Negative for appetite change.  Respiratory: Positive for cough and shortness of breath.   Cardiovascular: Negative for chest pain and leg swelling.  Gastrointestinal: Negative for abdominal pain.  Genitourinary: Negative for flank pain.  Musculoskeletal: Negative for back pain.  Skin: Negative for rash.  Neurological: Positive for weakness.     Physical Exam Updated Vital Signs BP 108/79   Pulse (!) 115 Comment: irregular, A fib  Temp 98.4 F (36.9 C) (Oral)   Resp 20   Ht 6\' 2"  (1.88 m)   Wt 74.6 kg   SpO2 99%   BMI 21.12 kg/m   Physical Exam Vitals signs and nursing note reviewed.  HENT:     Head: Atraumatic.  Cardiovascular:     Rate and Rhythm: Rhythm irregular.  Pulmonary:     Comments: Mildly harsh breath sounds without focal rales. Chest:     Chest wall: No mass.  Musculoskeletal:     Right lower leg: No edema.     Left lower leg: No edema.  Skin:    General: Skin is warm.     Capillary Refill: Capillary refill takes less than 2 seconds.  Neurological:     General: No focal deficit present.     Mental Status: He is alert.      ED Treatments / Results  Labs (all labs ordered are listed, but only  abnormal results are displayed) Labs  Reviewed  CBC - Abnormal; Notable for the following components:      Result Value   WBC 10.8 (*)    RBC 3.85 (*)    Hemoglobin 11.8 (*)    HCT 35.8 (*)    All other components within normal limits  BASIC METABOLIC PANEL - Abnormal; Notable for the following components:   Sodium 127 (*)    Chloride 90 (*)    Calcium 8.6 (*)    All other components within normal limits  PROTIME-INR - Abnormal; Notable for the following components:   Prothrombin Time 27.5 (*)    INR 2.6 (*)    All other components within normal limits  SARS CORONAVIRUS 2 (HOSPITAL ORDER, [place] LAB)  PROTIME-INR    EKG EKG Interpretation  Date/Time:  Saturday Aug 10 2018 12:55:43 EDT Ventricular Rate:  124 PR Interval:    QRS Duration: 124 QT Interval:  328 QTC Calculation: 472 R Axis:   106 Text Interpretation:  Atrial fibrillation Left bundle branch block Artifact in lead(s) I II aVR aVL V1 Confirmed by Davonna Belling [phone]) on 08/10/2018 2:10:32 PM   Radiology Dg Chest Portable 1 View  Result Date: 08/10/2018 CLINICAL DATA:  Short of breath EXAM: PORTABLE CHEST 1 VIEW COMPARISON:  08/06/2018 FINDINGS: Left subclavian AICD device is stable. Aortic valvuloplasty hardware remains in place. Airspace disease throughout the right lung is not significantly changed. Patchy airspace disease at the left base is increased. No pneumothorax. Normal heart size. IMPRESSION: Airspace disease throughout the right lung is not significantly changed. Increased airspace disease at the left lung base. Electronically Signed   By: Marybelle Killings M.D.   On: 08/10/2018 13:53    Procedures Procedures (including critical care time)  Medications Ordered in ED Medications  traMADol (ULTRAM) tablet 50 mg (has no administration in time range)  aspirin EC tablet 81 mg (has no administration in time range)  levothyroxine (SYNTHROID) tablet 125 mcg (has no administration in time range)  carvedilol (COREG)  tablet 6.25 mg (has no administration in time range)  furosemide (LASIX) tablet 20 mg (has no administration in time range)  mexiletine (MEXITIL) capsule 150 mg (has no administration in time range)  famotidine (PEPCID) tablet 40 mg (has no administration in time range)  alfuzosin (UROXATRAL) 24 hr tablet 10 mg (has no administration in time range)  budesonide-formoterol (SYMBICORT) 80-4.5 MCG/ACT inhaler 2 puff (has no administration in time range)  cycloSPORINE (RESTASIS) 0.05 % ophthalmic emulsion 1 drop (has no administration in time range)  lidocaine (LIDODERM) 5 % 1 patch (has no administration in time range)  ipratropium (ATROVENT) 0.03 % nasal spray 2 spray (has no administration in time range)  saccharomyces boulardii (FLORASTOR) capsule 250 mg (has no administration in time range)  linaclotide (LINZESS) capsule 145 mcg (has no administration in time range)  sodium chloride flush (NS) 0.9 % injection 3 mL (has no administration in time range)  ondansetron (ZOFRAN) tablet 4 mg (has no administration in time range)    Or  ondansetron (ZOFRAN) injection 4 mg (has no administration in time range)  acetaminophen (TYLENOL) tablet 650 mg (has no administration in time range)    Or  acetaminophen (TYLENOL) suppository 650 mg (has no administration in time range)  clindamycin (CLEOCIN) IVPB 300 mg ( Intravenous Rate/Dose Verify 08/10/18 1811)  Warfarin - Pharmacist Dosing Inpatient (has no administration in time range)  albuterol (PROVENTIL) (2.5 MG/3ML) 0.083% nebulizer solution 2.5 mg (has  no administration in time range)  warfarin (COUMADIN) tablet 5 mg (has no administration in time range)     Initial Impression / Assessment and Plan / ED Course  I have reviewed the triage vital signs and the nursing notes.  Pertinent labs & imaging results that were available during my care of the patient were reviewed by me and considered in my medical decision making (see chart for details).         Patient with hypoxia.  X-ray shows stable right lung but potentially worse on left.  However does well with nasal cannula here.  Currently on antibiotics.  Plan was for oxygen at home but does not appear to have oxygen yet.  Will discuss with care management about home oxygen.  Also discussed with Dr. Harvie Junior, who discharged the patient yesterday.  Discussed with Rosendo Gros from care management.  There have not been orders placed for home oxygen.  She discussed with the home health service to.  They do not have orders for it.  Patient appears to have a new oxygen requirement and will likely require admission to the hospital in order to arrange home oxygen.  Final Clinical Impressions(s) / ED Diagnoses   Final diagnoses:  Hypoxia    ED Discharge Orders    None       Davonna Belling, MD 08/10/18 [place]

## 2018-08-10 NOTE — ED Notes (Signed)
Attempted to call report to 2W. Nurse stated she would call back

## 2018-08-10 NOTE — Progress Notes (Signed)
ANTICOAGULATION CONSULT NOTE - Initial Consult  Pharmacy Consult for warfarin Indication: atrial fibrillation  Vital Signs: Temp: 99.6 F (37.6 C) (05/30 1254) Temp Source: Oral (05/30 1254) BP: 149/119 (05/30 1400) Pulse Rate: 100 (05/30 1400)  Labs: Recent Labs    08/08/18 0420 08/09/18 0623 08/10/18 1323  HGB 10.7*  --  11.8*  HCT 31.2*  --  35.8*  PLT 229  --  231  LABPROT 28.5* 26.4*  --   INR 2.7* 2.5*  --   CREATININE 0.95 0.91 1.04    Estimated Creatinine Clearance: 56.2 mL/min (by C-G formula based on SCr of 1.04 mg/dL).   Medical History: Past Medical History:  Diagnosis Date  . Aortic stenosis, severe    WITH PERCUTANEOUS AORTIC VALVE (TAVI) IN March 2012 at the [facility]  . Aspiration into airway   . Cervical myelopathy ([place])   . Chronic anticoagulation    on coumadin  . Chronic systolic CHF (congestive heart failure) ([place])   . Coronary artery disease    a.  s/p CABG 1979 and redo 1991.  Marland Kitchen Coughing   . Diverticulitis    CURRENTLY CONTROLLED WITH NO EVIDENCE OF RECURRENT INFECTION  . Esophageal stricture    WITH DILATATION  . Frailty   . GERD (gastroesophageal reflux disease)   . Heart murmur   . Hyperlipidemia   . Hyponatremia   . Ischemic cardiomyopathy 05/2010   Has EF of 25%  . MI (myocardial infarction) ([place]) 1974, 1978  . Osteoarthritis    RIGHT KNEE  . Osteoporosis   . Permanent atrial fibrillation    Has not tolerated amiodarone in the past. Amiodarone was stopped in September of 2010 due to side effects.  . Presence of cardioverter defibrillator   . Prostate cancer ([place])   . Pulmonary embolism ([place])   . Stroke [facility])    3 strokes  . VT (ventricular tachycardia) ([facility])     Assessment: 7 yom presented to the hospital with SOB. He is on chronic warfarin for history of afib. INR is therapeutic today at 2.6. Hgb is slightly low but platelets are WNL. No bleeding noted.  PTA dose: 5mg  daily except 7.5mg  on Fridays  Goal of  Therapy:  INR 2.5-3 Monitor platelets by anticoagulation protocol: Yes   Plan:  Warfarin 5mg  PO x 1 tonight Daily INR  Bodhi Stenglein, Rande Lawman 08/10/2018,4:28 PM

## 2018-08-10 NOTE — ED Triage Notes (Signed)
Pt brought in by [facility] from home for SOB, pt recently diagnosed with pneumonia. Pt was on O2 in the hospital, but not discharged with it. Pt 78% on RA on EMS arrival. Pt 99% on 6L, diminished in all lung fields per EMS, significant on the right side. Per family waiting on prescription for home O2.

## 2018-08-10 NOTE — H&P (Signed)
History and Physical    [name] OQH:[id number] DOB: 1932-11-23 DOA: 08/10/2018  Referring MD/NP/PA: Davonna Belling, MD   PCP: [name], MD  Patient coming from: Home  Chief Complaint: Shortness of breath  I have personally briefly reviewed patient's old medical records in [place]   HPI: [name] is a 85 y.o. male with medical history significant of CAD s/p CABG in 1979 with redo 1991, permanent atrial fibrillation on Coumadin, CVA, dysphagia, prostate cancer s/p radiation, BPH, arthritis, and hyponatremia; who presents with complaints of shortness of breath.  Patient had just recently been hospitalized from 5/26-5/29, for acute respiratory failure with hypoxia secondary to aspiration pneumonia.  It appears during hospitalization it was recommended for patient go to skilled nursing facility, but ultimately patient was discharged back home with his wife and advance home care services.  For the aspiration pneumonia he was sent home with a 4-day course of clindamycin.  He took a dose this morning.  Family notes that he did have difficulty swallowing the clindamycin capsules.  At home family noted that this morning he was struggling to breathe.  O2 saturations were noted to drop into the 70s for which EMS was called.  Family report that he needs to be on oxygen, but it was not set up at discharge. Wife also reports needing a hospital bed and wheelchair to help care for him at home.  At baseline patient is not very mobile due to severe arthritis with bone-on-bone.  Upon EMS arrival O2 saturations 78% on room air, and improved to 99% on 6 L nasal cannula oxygen.  ED Course: Upon admission into the emergency department patient was noted to be febrile, pulse 96-119, respiration 18-22, blood pressures maintained, and O2 saturations 98 to 99% on 2 L nasal cannula oxygen.  Labs revealed WBC 10.8, hemoglobin 11.8, sodium 127, creatinine 1.04, and INR 2.6.  Coronavirus was negative.   Chest x-ray showing worsening left lung disease.  TRH called to admit.  Review of Systems  Constitutional: Negative for fever.  HENT: Positive for hearing loss. Negative for nosebleeds.   Eyes: Negative for photophobia and pain.  Respiratory: Positive for cough, shortness of breath and wheezing.   Cardiovascular: Negative for chest pain and leg swelling.  Gastrointestinal: Negative for abdominal pain, nausea and vomiting.  Genitourinary: Negative for dysuria.  Musculoskeletal: Positive for back pain, joint pain and myalgias.  Skin: Negative for itching.  Neurological: Negative for focal weakness and loss of consciousness.  Endo/Heme/Allergies: Bruises/bleeds easily.  Psychiatric/Behavioral: Negative for substance abuse.    Past Medical History:  Diagnosis Date  . Aortic stenosis, severe    WITH PERCUTANEOUS AORTIC VALVE (TAVI) IN March 2012 at the [facility]  . Aspiration into airway   . Cervical myelopathy ([place])   . Chronic anticoagulation    on coumadin  . Chronic systolic CHF (congestive heart failure) ([place])   . Coronary artery disease    a.  s/p CABG 1979 and redo 1991.  Marland Kitchen Coughing   . Diverticulitis    CURRENTLY CONTROLLED WITH NO EVIDENCE OF RECURRENT INFECTION  . Esophageal stricture    WITH DILATATION  . Frailty   . GERD (gastroesophageal reflux disease)   . Heart murmur   . Hyperlipidemia   . Hyponatremia   . Ischemic cardiomyopathy 05/2010   Has EF of 25%  . MI (myocardial infarction) ([place]) 1974, 1978  . Osteoarthritis    RIGHT KNEE  . Osteoporosis   . Permanent atrial  fibrillation    Has not tolerated amiodarone in the past. Amiodarone was stopped in September of 2010 due to side effects.  . Presence of cardioverter defibrillator   . Prostate cancer ([place])   . Pulmonary embolism ([place])   . Stroke [facility])    3 strokes  . VT (ventricular tachycardia) ([place])     Past Surgical History:  Procedure Laterality Date  . AORTIC VALVE REPLACEMENT      Percutaneous AVR in March 2012 at the [facility]  . BACK SURGERY    . CARDIAC CATHETERIZATION  2010   SEVERE LV DYSFUNCTION WITH ESTIMATED EJECTION FRACTION OF 25%  . CARDIOVERSION  02/16/2011   Procedure: CARDIOVERSION;  Surgeon: Carlena Bjornstad, MD;  Location: [place];  Service: Cardiovascular;  Laterality: N/A;  . CHOLECYSTECTOMY    . CORONARY ARTERY BYPASS GRAFT  1979  . CORONARY ARTERY BYPASS GRAFT  1991   REDO SURGERY  . ICD  Feb 2003; 02/2013   gen change 02-11-2013 by Dr Caryl Comes  . IMPLANTABLE CARDIOVERTER DEFIBRILLATOR (ICD) GENERATOR CHANGE N/A 02/10/2013   Procedure: ICD GENERATOR CHANGE;  Surgeon: Deboraha Sprang, MD;  Location: [name] CATH LAB;  Service: Cardiovascular;  Laterality: N/A;  . SHOULDER SURGERY       reports that he quit smoking about 49 years ago. His smoking use included cigarettes. He has a 60.00 pack-year smoking history. He has never used smokeless tobacco. He reports that he does not drink alcohol or use drugs.  Allergies  Allergen Reactions  . Penicillins Shortness Of Breath and Rash    Has patient had a PCN reaction causing immediate rash, facial/tongue/throat swelling, SOB or lightheadedness with hypotension: Yes Has patient had a PCN reaction causing severe rash involving mucus membranes or skin necrosis: No Has patient had a PCN reaction that required hospitalization No Has patient had a PCN reaction occurring within the last 10 years: No If all of the above answers are "NO", then may proceed with Cephalosporin use.   . Tizanidine Shortness Of Breath    Light headed  . Ace Inhibitors     Has not tolerated in the past due to hyperkalemia  . Antihistamines, Diphenhydramine-Type Other (See Comments)    Inhibits urination  . Clemastine Fumarate Other (See Comments)    Inhibits urination  . Dimenhydrinate Other (See Comments)    Inhibits urination  . Amiodarone Other (See Comments)    Unknown     Family History  Problem Relation Age of Onset  .  Heart disease Mother 49  . Diabetes Mother 37  . Heart disease Father 9    Prior to Admission medications   Medication Sig Start Date End Date Taking? Authorizing Provider  alfuzosin (UROXATRAL) 10 MG 24 hr tablet Take 10 mg by mouth at bedtime.     [provider]  aspirin 81 MG tablet Take 81 mg by mouth daily.    [provider]  benzonatate (TESSALON) 100 MG capsule Take 100 mg by mouth 3 (three) times daily as needed for cough.    [provider]  Budesonide-Formoterol Fumarate (SYMBICORT IN) Inhale 1 puff into the lungs 2 (two) times daily.    [provider]  carvedilol (COREG) 6.25 MG tablet TAKE 1 TABLET BY MOUTH TWICE DAILY WITH A MEAL. Patient taking differently: Take 6.25 mg by mouth 2 (two) times daily with a meal.  07/01/18   Sherren Mocha, MD  cholecalciferol (VITAMIN D) 1000 UNITS tablet Take 1,000 Units by mouth 2 (two)  times daily.    [provider]  clindamycin (CLEOCIN) 300 MG capsule Take 1 capsule (300 mg total) by mouth 3 (three) times daily for 4 days. 08/09/18 08/13/18  Marcell Anger, MD  famotidine (PEPCID) 40 MG tablet Take 40 mg by mouth daily.    [provider]  feeding supplement, ENSURE ENLIVE, (ENSURE ENLIVE) LIQD Take 237 mLs by mouth 2 (two) times daily between meals. 07/24/18   Mercy Riding, MD  furosemide (LASIX) 20 MG tablet TAKE (1) TABLET DAILY AS NEEDED FOR SWELLING. Patient taking differently: Take 20 mg by mouth daily as needed (swelling).  06/19/16   Sherren Mocha, MD  ipratropium (ATROVENT) 0.03 % nasal spray Place 2 sprays into both nostrils 2 (two) times daily.    [provider]  levothyroxine (SYNTHROID) 125 MCG tablet Take 1 tablet (125 mcg total) by mouth daily before breakfast. 09/02/15   Hosie Poisson, MD  lidocaine (LIDODERM) 5 % Place 1 patch onto the skin daily as needed (pain). Remove & Discard patch within 12 hours or as directed by MD    [provider]   linaclotide (LINZESS) 145 MCG CAPS capsule Take 145 mcg by mouth daily before breakfast.    [provider]  mexiletine (MEXITIL) 150 MG capsule Take 1 capsule (150 mg total) by mouth 2 (two) times a day. 07/07/18   Cheryln Manly, NP  nitroGLYCERIN (NITROSTAT) 0.4 MG SL tablet Place 1 tablet (0.4 mg total) under the tongue every 5 (five) minutes x 3 doses as needed for chest pain (Max 3 doses in 15 min. Call 911). 12/06/16   Sherren Mocha, MD  omeprazole (PRILOSEC) 20 MG capsule Take 20 mg by mouth daily as needed (acid reflux).    [provider]  polyethylene glycol (MIRALAX / GLYCOLAX) packet Take 17 g by mouth daily as needed for mild constipation.     [provider]  Probiotic Product (PROBIOTIC DAILY PO) Take 1 capsule by mouth daily.    [provider]  RESTASIS 0.05 % ophthalmic emulsion Place 1 drop into both eyes 2 (two) times daily as needed (dry eyes).  12/05/11   [provider]  traMADol (ULTRAM) 50 MG tablet Take 50 mg by mouth every 6 (six) hours as needed for moderate pain.    [provider]  warfarin (COUMADIN) 5 MG tablet Take 1-1.5 tablets (5-7.5 mg total) by mouth See admin instructions. Take 7.5 mg by mouth only on Fridays; all other days take 5mg  07/07/18   Cheryln Manly, NP    Physical Exam:  Constitutional: Elderly male in some respiratory discomfort Vitals:   08/10/18 1254 08/10/18 1345 08/10/18 1400  BP: (!) 114/92 111/65 (!) 149/119  Pulse: 96 (!) 119 100  Resp: (!) 22 (!) 21 18  Temp: 99.6 F (37.6 C)    TempSrc: Oral    SpO2: 98% 99% 98%   Eyes: PERRL, lids and conjunctivae normal ENMT: Mucous membranes are dry. Posterior pharynx clear of any exudate or lesions.Normal dentition.  Neck: normal, supple, no masses, no thyromegaly Respiratory: Decreased aeration with expiratory wheeze appreciated on the left lung field.  Patient on 2 L nasal cannula oxygen maintaining O2 saturations at this time.  Cardiovascular: Irregular irregular, no murmurs / rubs / gallops. No extremity edema. 2+ pedal pulses. No carotid bruits.  Abdomen: no tenderness, no masses palpated. No hepatosplenomegaly. Bowel sounds positive.  Musculoskeletal: no clubbing / cyanosis.  Crepitation noted of the knees. Skin: bruising noted of  the upper lower extremities Neurologic: CN 2-12 grossly intact. Sensation intact, DTR normal. Strength 4+/5 in all 4.  Psychiatric: Normal judgment and insight. Alert and oriented x 3. Normal mood.     Labs on Admission: I have personally reviewed following labs and imaging studies  CBC: Recent Labs  Lab 08/06/18 1008 08/07/18 0426 08/08/18 0420 08/10/18 1323  WBC 9.6 13.5* 13.0* 10.8*  NEUTROABS 8.5*  --  12.2*  --   HGB 12.4* 10.7* 10.7* 11.8*  HCT 38.4* 31.8* 31.2* 35.8*  MCV 93.7 90.6 88.6 93.0  PLT 274 217 229 748   Basic Metabolic Panel: Recent Labs  Lab 08/06/18 1008 08/07/18 0426 08/08/18 0420 08/09/18 0623 08/10/18 1323  NA 126* 124* 124* 127* 127*  K 4.5 4.4 4.6 4.4 4.4  CL 91* 92* 93* 88* 90*  CO2 24 24 22 25 24   GLUCOSE 103* 136* 102* 98 86  BUN 10 14 20 17 16   CREATININE 0.96 0.95 0.95 0.91 1.04  CALCIUM 8.7* 8.3* 8.6* 8.9 8.6*   GFR: Estimated Creatinine Clearance: 56.2 mL/min (by C-G formula based on SCr of 1.04 mg/dL). Liver Function Tests: No results for input(s): AST, ALT, ALKPHOS, BILITOT, PROT, ALBUMIN in the last 168 hours. No results for input(s): LIPASE, AMYLASE in the last 168 hours. No results for input(s): AMMONIA in the last 168 hours. Coagulation Profile: Recent Labs  Lab 08/06/18 1525 08/07/18 0426 08/08/18 0420 08/09/18 0623  INR 2.7* 3.4* 2.7* 2.5*   Cardiac Enzymes: Recent Labs  Lab 08/06/18 1008  TROPONINI <0.03   BNP (last 3 results) No results for input(s): PROBNP in the last 8760 hours. HbA1C: No results for input(s): HGBA1C in the last 72 hours. CBG: No results for input(s): GLUCAP in the last 168 hours.  Lipid Profile: No results for input(s): CHOL, HDL, LDLCALC, TRIG, CHOLHDL, LDLDIRECT in the last 72 hours. Thyroid Function Tests: No results for input(s): TSH, T4TOTAL, FREET4, T3FREE, THYROIDAB in the last 72 hours. Anemia Panel: No results for input(s): VITAMINB12, FOLATE, FERRITIN, TIBC, IRON, RETICCTPCT in the last 72 hours. Urine analysis:    Component Value Date/Time   COLORURINE YELLOW 07/23/2018 0249   APPEARANCEUR CLEAR 07/23/2018 0249   LABSPEC 1.010 07/23/2018 0249   LABSPEC 1.010 11/16/2006 1032   PHURINE 6.5 07/23/2018 0249   GLUCOSEU NEGATIVE 07/23/2018 0249   HGBUR NEGATIVE 07/23/2018 0249   BILIRUBINUR NEGATIVE 07/23/2018 0249   BILIRUBINUR Negative 11/16/2006 1032   KETONESUR 15 (A) 07/23/2018 0249   PROTEINUR NEGATIVE 07/23/2018 0249   UROBILINOGEN 0.2 11/25/2014 0510   NITRITE NEGATIVE 07/23/2018 0249   LEUKOCYTESUR NEGATIVE 07/23/2018 0249   LEUKOCYTESUR Trace 11/16/2006 1032   Sepsis Labs: Recent Results (from the past 240 hour(s))  SARS Coronavirus 2 (CEPHEID- Performed in [place] hospital lab), Hosp Order     Status: None   Collection Time: 08/06/18 10:41 AM  Result Value Ref Range Status   SARS Coronavirus 2 NEGATIVE NEGATIVE Final    Comment: (NOTE) If result is NEGATIVE SARS-CoV-2 target nucleic acids are NOT DETECTED. The SARS-CoV-2 RNA is generally detectable in upper and lower  respiratory specimens during the acute phase of infection. The lowest  concentration of SARS-CoV-2 viral copies this assay can detect is 250  copies / mL. A negative result does not preclude SARS-CoV-2 infection  and should not be used as the sole basis for treatment or other  patient management decisions.  A negative result may occur with  improper specimen collection / handling, submission of specimen  other  than nasopharyngeal swab, presence of viral mutation(s) within the  areas targeted by this assay, and inadequate number of viral copies  (<250 copies / mL). A  negative result must be combined with clinical  observations, patient history, and epidemiological information. If result is POSITIVE SARS-CoV-2 target nucleic acids are DETECTED. The SARS-CoV-2 RNA is generally detectable in upper and lower  respiratory specimens dur ing the acute phase of infection.  Positive  results are indicative of active infection with SARS-CoV-2.  Clinical  correlation with patient history and other diagnostic information is  necessary to determine patient infection status.  Positive results do  not rule out bacterial infection or co-infection with other viruses. If result is PRESUMPTIVE POSTIVE SARS-CoV-2 nucleic acids MAY BE PRESENT.   A presumptive positive result was obtained on the submitted specimen  and confirmed on repeat testing.  While 2019 novel coronavirus  (SARS-CoV-2) nucleic acids may be present in the submitted sample  additional confirmatory testing may be necessary for epidemiological  and / or clinical management purposes  to differentiate between  SARS-CoV-2 and other Sarbecovirus currently known to infect humans.  If clinically indicated additional testing with an alternate test  methodology [id number]) is advised. The SARS-CoV-2 RNA is generally  detectable in upper and lower respiratory sp ecimens during the acute  phase of infection. The expected result is Negative. Fact Sheet for Patients:  StrictlyIdeas.no Fact Sheet for Healthcare Providers: BankingDealers.co.za This test is not yet approved or cleared by the [place] FDA and has been authorized for detection and/or diagnosis of SARS-CoV-2 by FDA under an Emergency Use Authorization (EUA).  This EUA will remain in effect (meaning this test can be used) for the duration of the COVID-19 declaration under Section 564(b)(1) of the Act, 21 U.S.C. section 360bbb-3(b)(1), unless the authorization is terminated or revoked sooner. Performed  at [facility], [place] [address], [place], [place] [address]      Radiological Exams on Admission: Dg Chest Portable 1 View  Result Date: 08/10/2018 CLINICAL DATA:  Short of breath EXAM: PORTABLE CHEST 1 VIEW COMPARISON:  08/06/2018 FINDINGS: Left subclavian AICD device is stable. Aortic valvuloplasty hardware remains in place. Airspace disease throughout the right lung is not significantly changed. Patchy airspace disease at the left base is increased. No pneumothorax. Normal heart size. IMPRESSION: Airspace disease throughout the right lung is not significantly changed. Increased airspace disease at the left lung base. Electronically Signed   By: Marybelle Killings M.D.   On: 08/10/2018 13:53    EKG: Independently reviewed.  Atrial fibrillation at 124 bpm  Assessment/Plan Acute on chronic respiratory failure with hypoxia secondary to aspiration pneumonia: He presents to the hospital after being discharged yesterday with complaints of respiratory distress.  At home O2 sats in the 70s on room air.  Chest x-ray showing increased airspace disease in left lung base.  Suspect likely still aspirating.  Patient had been prescribed 4-day course of clindamycin at discharge.  Family notes patient having difficulty swallowing the clindamycin. -Admit to a medical telemetry bed  -Aspiration precautions -Elevate head of bed -Change clindamycin from p.o. to IV -Incentive spirometry every hour while awake  Leukocytosis: WBC 10.8, but appears to be trending down from previous. -Continue to monitor  History of CVA, dysphagia: Chronic.  Patient has history of esophageal stricture, and just feeling had repeat modified barium swallow performed on 5/27.  Dysphagia 1(pure)diet with thin liquids recommended. -Dysphasia 1 diet   -Medications in applesauce  COPD: Patient  with some mild wheezing noted only in the left lung field only. -Continue Symbicort/pharmacy substitution -Albuterol nebs 4 times daily   Chronic atrial fibrillation on chronic anticoagulation: Heart rates into the 120s. INR therapeutic at 2.6. -Continue Coreg  and mexiletine -Coumadin per pharmacy  Chronic systolic CHF: Patient appears euvolemic at this time.  EF noted to be 20 to 25% with diffuse hypokinesis in 12/2017. -Strict intake and output and daily weights -Lasix as needed   Hyponatremia: Chronic.  Sodium 127 on admission which appears near his baseline. -Continue to monitor  Hypothyroidism: Stable.  Last TSH 3.768 on 5/12. -Continue levothyroxine   Debility with presumed dementia: During last admission it appears son felt that the patient's wife was unable to care for him at home.  PT/OT evaluated the patient and recommended skilled nursing facility placement, but wife ultimately decided against this. -Care management consult for needs of hospital bed, wheelchair, and oxygen  History of ventricular tachyarrhythmia s/p AICD   GERD -Continue Pepcid  DVT prophylaxis: Coumadin Code Status: Full Family Communication: Discussed plan of care with patient's wife and son over the phone. Disposition Plan: Possible discharge home in 1 to 2 days Consults called: None Admission status: Observation  Norval Morton MD [facility] Pager [phone]   If 7PM-7AM, please contact night-coverage [email or web address] Password [facility]  08/10/2018, 3:53 PM

## 2018-08-10 NOTE — ED Notes (Signed)
Pt family given update on pt status and pt inpatient bed

## 2018-08-10 NOTE — ED Notes (Signed)
Pt family called and given an update on pt status and plan of care

## 2018-08-11 DIAGNOSIS — I509 Heart failure, unspecified: Secondary | ICD-10-CM | POA: Diagnosis not present

## 2018-08-11 DIAGNOSIS — D649 Anemia, unspecified: Secondary | ICD-10-CM | POA: Diagnosis not present

## 2018-08-11 DIAGNOSIS — E785 Hyperlipidemia, unspecified: Secondary | ICD-10-CM | POA: Diagnosis present

## 2018-08-11 DIAGNOSIS — J449 Chronic obstructive pulmonary disease, unspecified: Secondary | ICD-10-CM | POA: Diagnosis present

## 2018-08-11 DIAGNOSIS — I2510 Atherosclerotic heart disease of native coronary artery without angina pectoris: Secondary | ICD-10-CM | POA: Diagnosis present

## 2018-08-11 DIAGNOSIS — L89152 Pressure ulcer of sacral region, stage 2: Secondary | ICD-10-CM | POA: Diagnosis not present

## 2018-08-11 DIAGNOSIS — Z7189 Other specified counseling: Secondary | ICD-10-CM

## 2018-08-11 DIAGNOSIS — B370 Candidal stomatitis: Secondary | ICD-10-CM | POA: Diagnosis present

## 2018-08-11 DIAGNOSIS — R5381 Other malaise: Secondary | ICD-10-CM | POA: Diagnosis not present

## 2018-08-11 DIAGNOSIS — R0902 Hypoxemia: Secondary | ICD-10-CM | POA: Diagnosis not present

## 2018-08-11 DIAGNOSIS — R627 Adult failure to thrive: Secondary | ICD-10-CM

## 2018-08-11 DIAGNOSIS — Z9981 Dependence on supplemental oxygen: Secondary | ICD-10-CM | POA: Diagnosis not present

## 2018-08-11 DIAGNOSIS — I4821 Permanent atrial fibrillation: Secondary | ICD-10-CM | POA: Diagnosis present

## 2018-08-11 DIAGNOSIS — J9601 Acute respiratory failure with hypoxia: Secondary | ICD-10-CM | POA: Diagnosis not present

## 2018-08-11 DIAGNOSIS — R791 Abnormal coagulation profile: Secondary | ICD-10-CM | POA: Diagnosis present

## 2018-08-11 DIAGNOSIS — I482 Chronic atrial fibrillation, unspecified: Secondary | ICD-10-CM | POA: Diagnosis not present

## 2018-08-11 DIAGNOSIS — Z95810 Presence of automatic (implantable) cardiac defibrillator: Secondary | ICD-10-CM | POA: Diagnosis not present

## 2018-08-11 DIAGNOSIS — F0390 Unspecified dementia without behavioral disturbance: Secondary | ICD-10-CM | POA: Diagnosis present

## 2018-08-11 DIAGNOSIS — Z515 Encounter for palliative care: Secondary | ICD-10-CM

## 2018-08-11 DIAGNOSIS — I5022 Chronic systolic (congestive) heart failure: Secondary | ICD-10-CM | POA: Diagnosis not present

## 2018-08-11 DIAGNOSIS — I4891 Unspecified atrial fibrillation: Secondary | ICD-10-CM | POA: Diagnosis not present

## 2018-08-11 DIAGNOSIS — Z8546 Personal history of malignant neoplasm of prostate: Secondary | ICD-10-CM | POA: Diagnosis not present

## 2018-08-11 DIAGNOSIS — Z6821 Body mass index (BMI) 21.0-21.9, adult: Secondary | ICD-10-CM | POA: Diagnosis not present

## 2018-08-11 DIAGNOSIS — J690 Pneumonitis due to inhalation of food and vomit: Principal | ICD-10-CM

## 2018-08-11 DIAGNOSIS — I472 Ventricular tachycardia: Secondary | ICD-10-CM | POA: Diagnosis present

## 2018-08-11 DIAGNOSIS — M1711 Unilateral primary osteoarthritis, right knee: Secondary | ICD-10-CM | POA: Diagnosis present

## 2018-08-11 DIAGNOSIS — Z66 Do not resuscitate: Secondary | ICD-10-CM | POA: Diagnosis not present

## 2018-08-11 DIAGNOSIS — Z741 Need for assistance with personal care: Secondary | ICD-10-CM | POA: Diagnosis not present

## 2018-08-11 DIAGNOSIS — I69391 Dysphagia following cerebral infarction: Secondary | ICD-10-CM | POA: Diagnosis not present

## 2018-08-11 DIAGNOSIS — G959 Disease of spinal cord, unspecified: Secondary | ICD-10-CM | POA: Diagnosis present

## 2018-08-11 DIAGNOSIS — J9621 Acute and chronic respiratory failure with hypoxia: Secondary | ICD-10-CM | POA: Diagnosis present

## 2018-08-11 DIAGNOSIS — S81811D Laceration without foreign body, right lower leg, subsequent encounter: Secondary | ICD-10-CM | POA: Diagnosis not present

## 2018-08-11 DIAGNOSIS — S81812D Laceration without foreign body, left lower leg, subsequent encounter: Secondary | ICD-10-CM | POA: Diagnosis not present

## 2018-08-11 DIAGNOSIS — R296 Repeated falls: Secondary | ICD-10-CM | POA: Diagnosis not present

## 2018-08-11 DIAGNOSIS — N183 Chronic kidney disease, stage 3 (moderate): Secondary | ICD-10-CM | POA: Diagnosis present

## 2018-08-11 DIAGNOSIS — Z7901 Long term (current) use of anticoagulants: Secondary | ICD-10-CM | POA: Diagnosis not present

## 2018-08-11 DIAGNOSIS — K219 Gastro-esophageal reflux disease without esophagitis: Secondary | ICD-10-CM | POA: Diagnosis present

## 2018-08-11 DIAGNOSIS — L89141 Pressure ulcer of left lower back, stage 1: Secondary | ICD-10-CM | POA: Diagnosis not present

## 2018-08-11 DIAGNOSIS — I25709 Atherosclerosis of coronary artery bypass graft(s), unspecified, with unspecified angina pectoris: Secondary | ICD-10-CM | POA: Diagnosis not present

## 2018-08-11 DIAGNOSIS — R1310 Dysphagia, unspecified: Secondary | ICD-10-CM | POA: Diagnosis not present

## 2018-08-11 DIAGNOSIS — I255 Ischemic cardiomyopathy: Secondary | ICD-10-CM | POA: Diagnosis present

## 2018-08-11 DIAGNOSIS — E871 Hypo-osmolality and hyponatremia: Secondary | ICD-10-CM | POA: Diagnosis present

## 2018-08-11 DIAGNOSIS — S41111D Laceration without foreign body of right upper arm, subsequent encounter: Secondary | ICD-10-CM | POA: Diagnosis not present

## 2018-08-11 DIAGNOSIS — Z20828 Contact with and (suspected) exposure to other viral communicable diseases: Secondary | ICD-10-CM | POA: Diagnosis present

## 2018-08-11 DIAGNOSIS — E039 Hypothyroidism, unspecified: Secondary | ICD-10-CM | POA: Diagnosis present

## 2018-08-11 DIAGNOSIS — S61412D Laceration without foreign body of left hand, subsequent encounter: Secondary | ICD-10-CM | POA: Diagnosis not present

## 2018-08-11 DIAGNOSIS — Z954 Presence of other heart-valve replacement: Secondary | ICD-10-CM | POA: Diagnosis not present

## 2018-08-11 DIAGNOSIS — R1313 Dysphagia, pharyngeal phase: Secondary | ICD-10-CM | POA: Diagnosis present

## 2018-08-11 DIAGNOSIS — I252 Old myocardial infarction: Secondary | ICD-10-CM | POA: Diagnosis not present

## 2018-08-11 LAB — BASIC METABOLIC PANEL
Anion gap: 10 (ref 5–15)
BUN: 15 mg/dL (ref 8–23)
CO2: 24 mmol/L (ref 22–32)
Calcium: 8.2 mg/dL — ABNORMAL LOW (ref 8.9–10.3)
Chloride: 94 mmol/L — ABNORMAL LOW (ref 98–111)
Creatinine, Ser: 0.81 mg/dL (ref 0.61–1.24)
GFR calc Af Amer: >60 mL/min (ref >60)
GFR calc non Af Amer: >60 mL/min (ref >60)
Glucose, Bld: 108 mg/dL — ABNORMAL HIGH (ref 70–99)
Potassium: 4.1 mmol/L (ref 3.5–5.1)
Sodium: 128 mmol/L — ABNORMAL LOW (ref 135–145)

## 2018-08-11 LAB — CBC WITH DIFFERENTIAL/PLATELET
Abs Immature Granulocytes: 0.1 10*3/uL — ABNORMAL HIGH (ref 0.00–0.07)
Basophils Absolute: 0 10*3/uL (ref 0.0–0.1)
Basophils Relative: 0 %
Eosinophils Absolute: 0.3 10*3/uL (ref 0.0–0.5)
Eosinophils Relative: 3 %
HCT: 32.2 % — ABNORMAL LOW (ref 39.0–52.0)
Hemoglobin: 10.7 g/dL — ABNORMAL LOW (ref 13.0–17.0)
Immature Granulocytes: 1 %
Lymphocytes Relative: 3 %
Lymphs Abs: 0.3 10*3/uL — ABNORMAL LOW (ref 0.7–4.0)
MCH: 30.4 pg (ref 26.0–34.0)
MCHC: 33.2 g/dL (ref 30.0–36.0)
MCV: 91.5 fL (ref 80.0–100.0)
Monocytes Absolute: 1 10*3/uL (ref 0.1–1.0)
Monocytes Relative: 10 %
Neutro Abs: 8.2 10*3/uL — ABNORMAL HIGH (ref 1.7–7.7)
Neutrophils Relative %: 83 %
Platelets: 222 10*3/uL (ref 150–400)
RBC: 3.52 MIL/uL — ABNORMAL LOW (ref 4.22–5.81)
RDW: 13.6 % (ref 11.5–15.5)
WBC: 10 10*3/uL (ref 4.0–10.5)
nRBC: 0 % (ref 0.0–0.2)

## 2018-08-11 LAB — PROTIME-INR
INR: 2.9 — ABNORMAL HIGH (ref 0.8–1.2)
Prothrombin Time: 30 seconds — ABNORMAL HIGH (ref 11.4–15.2)

## 2018-08-11 LAB — MAGNESIUM: Magnesium: 2.1 mg/dL (ref 1.7–2.4)

## 2018-08-11 MED ORDER — NYSTATIN 100000 UNIT/ML MT SUSP
5.0000 mL | Freq: Four times a day (QID) | OROMUCOSAL | Status: DC
  Administered 2018-08-11 – 2018-08-14 (×12): 500000 [IU] via ORAL
  Filled 2018-08-11 (×12): qty 5

## 2018-08-11 MED ORDER — BISACODYL 5 MG PO TBEC
5.0000 mg | DELAYED_RELEASE_TABLET | Freq: Once | ORAL | Status: AC
  Administered 2018-08-11: 5 mg via ORAL
  Filled 2018-08-11: qty 1

## 2018-08-11 MED ORDER — WARFARIN SODIUM 2.5 MG PO TABS
2.5000 mg | ORAL_TABLET | Freq: Once | ORAL | Status: AC
  Administered 2018-08-11: 2.5 mg via ORAL
  Filled 2018-08-11: qty 1

## 2018-08-11 NOTE — Progress Notes (Signed)
PROGRESS NOTE  [name] WNU:[id number] DOB: 1932/11/04 DOA: 08/10/2018 PCP: [name], MD  HPI/Recap of past 24 hours:  He is very weak, on o2 supplement, reports feeling better, denies pain, no fever  Assessment/Plan: Principal Problem:   Acute respiratory failure with hypoxia ([place]) Active Problems:   Osteoarthritis   Atrial fibrillation, chronic   Anticoagulated on Coumadin   Automatic implantable cardioverter-defibrillator in situ   Hypothyroidism   Dysphagia   Leukocytosis   Acute on chronic respiratory failure with hypoxia secondary to recurrent aspiration pneumonia: He presents to the hospital after being discharged yesterday with complaints of respiratory distress.  At home O2 sats in the 70s on room air.  Chest x-ray showing increased airspace disease in left lung base.   Patient had been prescribed 4-day course of clindamycin at discharge.  Family notes patient having difficulty swallowing the clindamycin. -currently on clindamycin from p.o. to IV -Incentive spirometry every hour while awake -improving, will ambulate check 02 sats on room air, arrange home o2 if meet criteria  COPD: Patient with some mild wheezing noted only in the left lung field only initially, today no wheezing  -Continue Symbicort/pharmacy substitution -Albuterol nebs 4 times daily  History of CVA, dysphagia: Chronic.  Patienthas history of esophageal stricture, and just feeling had repeat modified barium swallow performed on 5/27.  Dysphagia 1(pure)diet with thin liquids recommended. -Dysphasia 1 diet   -Medications in applesauce -contineu asa  GERD -Continue Pepcid  Chronicatrial fibrillationonchronic anticoagulation: Heart rates into the 120s. INR therapeutic at 2.6. -Continue Coregand mexiletine -Coumadin per pharmacy     Chronic systolicCHF: History of ventricular tachyarrhythmias/pAICD, EF noted to be 20 to 25% with diffuse hypokinesis in 12/2017. He is on coreg  and mexiletine at home which are continued Patient appears euvolemic to dry at this time.   -Strict intake and output and daily weights -Lasix as needed  Hyponatremia: Chronic. Sodium 127 on admission, today is 128 which appears near his baseline. -Continue to monitor  Hypothyroidism: Stable.  Last TSH 3.768 on 5/12. -Continue levothyroxine   Debility with presumed dementia/FTT/frequent hospitalizations:  During last admission it appears son felt that the patient's wife was unable to care for him at home.  PT/OT evaluated the patient and recommended skilled nursing facility placement, but wife ultimately decided against this. -Care management consult for needs of hospital bed, wheelchair, and oxygen -palliative care consulted for goals of care discussion  Oral thrush, topical nystatin   DVT prophylaxis: Coumadin  Code Status: full  Family Communication: patient   Disposition Plan: needs palliative team input, likely will need home equipment, home 02   Consultants:  Palliative care  Procedures:  none  Antibiotics:  clindamycin    Objective: BP 111/61 (BP Location: Right Arm)   Pulse 95   Temp 97.8 F (36.6 C) (Oral)   Resp 20   Ht 6\' 2"  (1.88 m)   Wt 74.6 kg   SpO2 99%   BMI 21.12 kg/m   Intake/Output Summary (Last 24 hours) at 08/11/2018 1044 Last data filed at 08/11/2018 0600 Gross per 24 hour  Intake 270 ml  Output 350 ml  Net -80 ml   Filed Weights   08/10/18 1848  Weight: 74.6 kg    Exam: Patient is examined daily including today on 08/11/2018, exams remain the same as of yesterday except that has changed    General: chronically ill ,  NAD, appear dehydrated and slight oral thrush   Cardiovascular: RRR  Respiratory: CTABL  Abdomen: Soft/ND/NT, positive BS  Musculoskeletal: No Edema  Neuro: alert, oriented to the year, place and person, not to the month  Data Reviewed: Basic Metabolic Panel: Recent Labs  Lab 08/06/18 1008  08/07/18 0426 08/08/18 0420 08/09/18 0623 08/10/18 1323  NA 126* 124* 124* 127* 127*  K 4.5 4.4 4.6 4.4 4.4  CL 91* 92* 93* 88* 90*  CO2 24 24 22 25 24   GLUCOSE 103* 136* 102* 98 86  BUN 10 14 20 17 16   CREATININE 0.96 0.95 0.95 0.91 1.04  CALCIUM 8.7* 8.3* 8.6* 8.9 8.6*   Liver Function Tests: No results for input(s): AST, ALT, ALKPHOS, BILITOT, PROT, ALBUMIN in the last 168 hours. No results for input(s): LIPASE, AMYLASE in the last 168 hours. No results for input(s): AMMONIA in the last 168 hours. CBC: Recent Labs  Lab 08/06/18 1008 08/07/18 0426 08/08/18 0420 08/10/18 1323  WBC 9.6 13.5* 13.0* 10.8*  NEUTROABS 8.5*  --  12.2*  --   HGB 12.4* 10.7* 10.7* 11.8*  HCT 38.4* 31.8* 31.2* 35.8*  MCV 93.7 90.6 88.6 93.0  PLT 274 217 229 231   Cardiac Enzymes:   Recent Labs  Lab 08/06/18 1008  TROPONINI <0.03   BNP (last 3 results) Recent Labs    07/06/18 1114 07/22/18 1711 08/06/18 1008  BNP 724.5* 793.6* 904.0*    ProBNP (last 3 results) No results for input(s): PROBNP in the last 8760 hours.  CBG: No results for input(s): GLUCAP in the last 168 hours.  Recent Results (from the past 240 hour(s))  SARS Coronavirus 2 (CEPHEID- Performed in [place] hospital lab), Hosp Order     Status: None   Collection Time: 08/06/18 10:41 AM  Result Value Ref Range Status   SARS Coronavirus 2 NEGATIVE NEGATIVE Final    Comment: (NOTE) If result is NEGATIVE SARS-CoV-2 target nucleic acids are NOT DETECTED. The SARS-CoV-2 RNA is generally detectable in upper and lower  respiratory specimens during the acute phase of infection. The lowest  concentration of SARS-CoV-2 viral copies this assay can detect is 250  copies / mL. A negative result does not preclude SARS-CoV-2 infection  and should not be used as the sole basis for treatment or other  patient management decisions.  A negative result may occur with  improper specimen collection / handling, submission of specimen  other  than nasopharyngeal swab, presence of viral mutation(s) within the  areas targeted by this assay, and inadequate number of viral copies  (<250 copies / mL). A negative result must be combined with clinical  observations, patient history, and epidemiological information. If result is POSITIVE SARS-CoV-2 target nucleic acids are DETECTED. The SARS-CoV-2 RNA is generally detectable in upper and lower  respiratory specimens dur ing the acute phase of infection.  Positive  results are indicative of active infection with SARS-CoV-2.  Clinical  correlation with patient history and other diagnostic information is  necessary to determine patient infection status.  Positive results do  not rule out bacterial infection or co-infection with other viruses. If result is PRESUMPTIVE POSTIVE SARS-CoV-2 nucleic acids MAY BE PRESENT.   A presumptive positive result was obtained on the submitted specimen  and confirmed on repeat testing.  While 2019 novel coronavirus  (SARS-CoV-2) nucleic acids may be present in the submitted sample  additional confirmatory testing may be necessary for epidemiological  and / or clinical management purposes  to differentiate between  SARS-CoV-2 and other Sarbecovirus currently known to infect humans.  If clinically indicated  additional testing with an alternate test  methodology [phone]) is advised. The SARS-CoV-2 RNA is generally  detectable in upper and lower respiratory sp ecimens during the acute  phase of infection. The expected result is Negative. Fact Sheet for Patients:  StrictlyIdeas.no Fact Sheet for Healthcare Providers: BankingDealers.co.za This test is not yet approved or cleared by the [place] FDA and has been authorized for detection and/or diagnosis of SARS-CoV-2 by FDA under an Emergency Use Authorization (EUA).  This EUA will remain in effect (meaning this test can be used) for the duration  of the COVID-19 declaration under Section 564(b)(1) of the Act, 21 U.S.C. section 360bbb-3(b)(1), unless the authorization is terminated or revoked sooner. Performed at [facility], [place] [address] [place], [place] [address]   SARS Coronavirus 2 (CEPHEID - Performed in [place] hospital lab), Hosp Order     Status: None   Collection Time: 08/10/18  4:11 PM  Result Value Ref Range Status   SARS Coronavirus 2 NEGATIVE NEGATIVE Final    Comment: (NOTE) If result is NEGATIVE SARS-CoV-2 target nucleic acids are NOT DETECTED. The SARS-CoV-2 RNA is generally detectable in upper and lower  respiratory specimens during the acute phase of infection. The lowest  concentration of SARS-CoV-2 viral copies this assay can detect is 250  copies / mL. A negative result does not preclude SARS-CoV-2 infection  and should not be used as the sole basis for treatment or other  patient management decisions.  A negative result may occur with  improper specimen collection / handling, submission of specimen other  than nasopharyngeal swab, presence of viral mutation(s) within the  areas targeted by this assay, and inadequate number of viral copies  (<250 copies / mL). A negative result must be combined with clinical  observations, patient history, and epidemiological information. If result is POSITIVE SARS-CoV-2 target nucleic acids are DETECTED. The SARS-CoV-2 RNA is generally detectable in upper and lower  respiratory specimens dur ing the acute phase of infection.  Positive  results are indicative of active infection with SARS-CoV-2.  Clinical  correlation with patient history and other diagnostic information is  necessary to determine patient infection status.  Positive results do  not rule out bacterial infection or co-infection with other viruses. If result is PRESUMPTIVE POSTIVE SARS-CoV-2 nucleic acids MAY BE PRESENT.   A presumptive positive result was obtained on the submitted specimen   and confirmed on repeat testing.  While 2019 novel coronavirus  (SARS-CoV-2) nucleic acids may be present in the submitted sample  additional confirmatory testing may be necessary for epidemiological  and / or clinical management purposes  to differentiate between  SARS-CoV-2 and other Sarbecovirus currently known to infect humans.  If clinically indicated additional testing with an alternate test  methodology [phone]) is advised. The SARS-CoV-2 RNA is generally  detectable in upper and lower respiratory sp ecimens during the acute  phase of infection. The expected result is Negative. Fact Sheet for Patients:  StrictlyIdeas.no Fact Sheet for Healthcare Providers: BankingDealers.co.za This test is not yet approved or cleared by the [place] FDA and has been authorized for detection and/or diagnosis of SARS-CoV-2 by FDA under an Emergency Use Authorization (EUA).  This EUA will remain in effect (meaning this test can be used) for the duration of the COVID-19 declaration under Section 564(b)(1) of the Act, 21 U.S.C. section 360bbb-3(b)(1), unless the authorization is terminated or revoked sooner. Performed at [facility], [name] [address] [place], [place] [address]  Studies: Dg Chest Portable 1 View  Result Date: 08/10/2018 CLINICAL DATA:  Short of breath EXAM: PORTABLE CHEST 1 VIEW COMPARISON:  08/06/2018 FINDINGS: Left subclavian AICD device is stable. Aortic valvuloplasty hardware remains in place. Airspace disease throughout the right lung is not significantly changed. Patchy airspace disease at the left base is increased. No pneumothorax. Normal heart size. IMPRESSION: Airspace disease throughout the right lung is not significantly changed. Increased airspace disease at the left lung base. Electronically Signed   By: Marybelle Killings M.D.   On: 08/10/2018 13:53    Scheduled Meds: . albuterol  2.5 mg Nebulization QID  .  alfuzosin  10 mg Oral QHS  . aspirin EC  81 mg Oral Daily  . carvedilol  6.25 mg Oral BID WC  . famotidine  40 mg Oral Daily  . ipratropium  2 spray Each Nare BID  . levothyroxine  125 mcg Oral QAC breakfast  . linaclotide  145 mcg Oral QAC breakfast  . mexiletine  150 mg Oral BID  . mometasone-formoterol  2 puff Inhalation BID  . saccharomyces boulardii  250 mg Oral Daily  . sodium chloride flush  3 mL Intravenous Q12H  . warfarin  2.5 mg Oral ONCE-1800  . Warfarin - Pharmacist Dosing Inpatient   Does not apply q1800    Continuous Infusions: . clindamycin (CLEOCIN) IV 300 mg (08/11/18 0905)     Time spent: 78mins I have personally reviewed and interpreted on  08/11/2018 daily labs, tele strips, imagings as discussed above under date review session and assessment and plans.  I reviewed all nursing notes, pharmacy notes, consultant notes,  vitals, pertinent old records  I have discussed plan of care as described above with RN , patient  on 08/11/2018   Florencia Reasons MD, PhD  [facility] Pager [phone]. If 7PM-7AM, please contact night-coverage at [email or web address], password [facility] 08/11/2018, 10:44 AM  LOS: 0 days

## 2018-08-11 NOTE — Progress Notes (Signed)
Tele called stated they called c.n.a. Jasmine and requested them to put leads back on pt and that it had now been approximately 15 minutes. Attempt to ask c.n.a. about lead replacement for pt, c.n.a. response unclear as well as unclear r/t pt breakfast order. Asked c.n.a. for clarity which was not provided. This nurse came to pt room and  assisted pt with lead replacement. Dietary called to place order for breakfast and state they just hung up with someone r/t order being placed. Requested dietary to place pt on automatic tray.Returned phone call to wife.

## 2018-08-11 NOTE — TOC Initial Note (Signed)
Transition of Care [facility]) - Initial/Assessment Note    Patient Details  Name: REESE STOCKMAN MRN: [id number] Date of Birth: [birth year]  Transition of Care [facility]) CM/SW Contact:    Carles Collet, RN Phone Number: 08/11/2018, 11:32 AM  Clinical Narrative:            Damaris Schooner w patient's wife on the phone. Had a very difficult time communicating with her due to her being very hard of hearing. She states that he has a RW at home. He would need any additional DME Attempted to discuss DME needs as she explained that he is in a lot of pain due tofractures in his back, asked if his RW gives him enough support. She had a lot of difficulty answering these questions, and kept repeating, "I don't know, you tell me." Notified her that PT recommends SNF, she is refusing for him to go to SNF. He was home for less than 24 hours, and was set up w [facility], they did not have time to see him in the home, but she would like for them to follow him after DC.   Palliative Consult pending. DME needs need address. Follow for oxygen for home use, will need order and qualifying saturations. Will likely require PTAR transport home. Will need [place] orders w face to face if DCs to home w home health, will not if he DC's to home w home hospice.           Expected Discharge Plan: [place] Barriers to Discharge: Continued Medical Work up   Patient Goals and CMS Choice Patient states their goals for this hospitalization and ongoing recovery are:: to go home      Expected Discharge Plan and Services Expected Discharge Plan: [place]   Discharge Planning Services: CM Consult Post Acute Care Choice: Resumption of Svcs/PTA Provider Living arrangements for the past 2 months: Single Family Home Expected Discharge Date: 08/13/18                         HH Arranged: RN, PT, OT, Nurse's Aide [place] Agency: [facility] ([place]) Date HH Agency Contacted: 08/11/18 Time [place]: 1131 Representative spoke with at [place]: Corene Cornea  Prior Living Arrangements/Services Living arrangements for the past 2 months: [place] with:: Spouse              Current home services: DME, Home OT, Home PT, Homehealth aide, Home RN    Activities of Daily Living Home Assistive Devices/Equipment: Eyeglasses, Environmental consultant (specify type) ADL Screening (condition at time of admission) Patient's cognitive ability adequate to safely complete daily activities?: Yes Is the patient deaf or have difficulty hearing?: Yes Does the patient have difficulty seeing, even when wearing glasses/contacts?: No Does the patient have difficulty concentrating, remembering, or making decisions?: No Patient able to express need for assistance with ADLs?: Yes Does the patient have difficulty dressing or bathing?: No Independently performs ADLs?: No Communication: Independent Dressing (OT): Independent, Needs assistance Is this a change from baseline?: Pre-admission baseline Grooming: Needs assistance Is this a change from baseline?: Pre-admission baseline Feeding: Independent Bathing: Needs assistance Is this a change from baseline?: Pre-admission baseline Toileting: Needs assistance Is this a change from baseline?: Pre-admission baseline In/Out Bed: Needs assistance Is this a change from baseline?: Pre-admission baseline Walks in Home: Needs assistance Is this a change from baseline?: Pre-admission baseline Does the patient have difficulty walking or  climbing stairs?: Yes Weakness of Legs: Both Weakness of Arms/Hands: None  Permission Sought/Granted                  Emotional Assessment              Admission diagnosis:  Hypoxia [R09.02] Patient Active Problem List   Diagnosis Date Noted  . Acute respiratory failure with hypoxia ([place]) 08/10/2018  . Dysphagia 08/10/2018  . Leukocytosis 08/10/2018  . Acute respiratory failure ([place]) 08/06/2018  . Aspiration  pneumonia ([place]) 08/06/2018  . Dehydration 07/22/2018  . Ventricular tachyarrhythmia ([place]) 07/06/2018  . Syncope 07/06/2018  . Pressure injury of skin 01/21/2017  . Acute urinary retention 01/21/2017  . Shortness of breath   . Weakness 09/01/2015  . Hyponatremia 09/01/2015  . Hypothyroidism 09/01/2015  . DOE (dyspnea on exertion) 09/01/2015  . Acute on chronic systolic congestive heart failure ([place]) 09/01/2015  . Exertional dyspnea 09/01/2015  . GI bleed 01/16/2014  . GIB (gastrointestinal bleeding) 01/16/2014  . Encounter for therapeutic drug monitoring 05/09/2013  . Automatic implantable cardioverter-defibrillator in situ 12/13/2012  . Rectal bleed 05/30/2012    Class: Acute  . Acute low back pain 05/30/2012    Class: Acute  . Hypotension 05/30/2012    Class: Acute  . Vertebral fracture, osteoporotic ([place]) 05/21/2012  . Gait instability 12/02/2011    Class: Acute  . Atrial fibrillation, chronic 12/02/2011    Class: Chronic  . Anticoagulated on Coumadin 12/02/2011  . Deformity, finger acquired 12/19/2010  . Chronic cough 12/05/2010  . S/P aortic valve replacement 10/22/2010  . Ischemic cardiomyopathy   . Diverticulitis   . Prostate cancer ([place])   . Osteoarthritis   . Cervical myelopathy ([place])   . Pulmonary embolism ([place])   . Hyperlipidemia   . GERD (gastroesophageal reflux disease)   . Esophageal stricture   . Indigestion 06/29/2010  . VENTRICULAR TACHYCARDIA 01/15/2009   PCP:  [name], MD Pharmacy:   [facility], [place] [address] Phone: [phone] Fax: [place], [place] - [address] [place] [address] Phone: [phone] Fax: [phone]     Social Determinants of Health (SDOH) Interventions    Readmission Risk Interventions No flowsheet data found.

## 2018-08-11 NOTE — Consult Note (Signed)
Consultation Note Date: 08/11/2018   Patient Name: [name]  DOB: April 06, 1932  MRN: [id number]  Age / Sex: 85 y.o., male  PCP: [name], MD Referring Physician: Florencia Reasons, MD  Reason for Consultation: Establishing goals of care and Psychosocial/spiritual support  HPI/Patient Profile: 85 y.o. male  with past medical history of severe aortic stenosis, TAVR 2012, chronic kidney disease stage III, coronary artery disease status post CABG 1979 with redo in 1749, systolic heart failure with EF of 25% (per echo 2019 ), atrial fib on anticoagulation, CVA x3, dysphasia with aspiration pneumonia, prostate cancer status post radiation, BPH, active ICD, esophageal stricture admitted on 08/10/2018 with shortness of breath, O2 sats in the 70s at home, cough.  Patient was just admitted to [facility] from 08/06/2018 - 08/09/2018 for acute respiratory failure, hypoxia secondary to aspiration pneumonia.  He has had 4 inpatient hospitalizations in the last 6 months related to his dyspnea, dysphasia and aspiration pneumonia.  Chest x-ray shows worsening disease in the left lung.  He tested negative for COVID-19 3 times (07/22/2018, 08/06/2018, 08/10/2018)  Consult ordered for goals of care.   Clinical Assessment and Goals of Care: Patient seen, chart reviewed.  Patient is alert and stating he feels much better.  He is requesting to go home today.  He states he is not been coughing or feeling short of breath since being admitted.  I introduced palliative medicine services as an additional resource and source of support with focus on patient's goals and quality of life.  I also introduced the concept of goals of care.  Patient states that he and his wife did develop advanced directives but it was years ago.  I explained to him that goals of care conversation were very similar to updating his advanced directives.  Patient shares  with me he has 5 children.  I began discussion in terms of goals of care regarding his CODE STATUS.  I explained full code versus DNR.  He initially states that he would want not want CPR, defibrillation or to be put on the ventilator.  I also reminded him that he has an active ICD.  At that point he stated " I guess I would have to turn that off ". When I asked permission to change his code  status, to update his providers, he stated "I guess I need to talk to my entire family about these things ".  Patient is capable of participating in goals of care discussion at this time.  Were he unable to, his healthcare proxy, would be his wife, Stpehen [name] at [phone].  It sounds as though patient and spouse are making healthcare decisions as a entire family  I reached out to patient's wife, [name], and we also began goals of care discussion.  We had extensive conversations regarding goals of care specifically CODE STATUS, his underlying medical conditions , with emphasis on his extensive cardiac disease and now late effects of CVA, aspiration pneumonia.  [name] have 5 children together  but their daughter [name] is a respiratory therapist at [employer].  She and [name] defer to her in terms of assisting them with healthcare decision making.  She asked me to call [name] which I did.  She  and I also had extensive goals of care conversation specifically CODE STATUS, ICD, equipment in the home, what potentially  to expect going forward in the context of recurrent asp pna..  I also introduced  the option of hospice.  I explained to her that according to Medicare guidelines it was my opinion that her father would meet criteria for in-home hospice benefit. I explained the benefits that they would provide for her father and mother specifically it would give him an option of treatment that did not entail coming to the hospital.  I also shared with [name] that you do not have to be a DNR to access your hospice  benefit   SUMMARY OF RECOMMENDATIONS   Full scope of treatment for now Full code Family recognizes need to have ongoing goals of care and reach some visions regarding CODE STATUS, future use of antibiotics, hospitalization, etc. I have emailed [name] an electronic version of booklet Hard Choices for [employer] as well as a MOST form to help in guiding goals of care conversations that she is going to have with her family. Palliative medicine to continue to support patient and family Code Status/Advance Care Planning:  Full code   Palliative Prophylaxis:   Aspiration, Bowel Regimen, Delirium Protocol, Eye Care, Frequent Pain Assessment, Oral Care and Turn Reposition  Additional Recommendations (Limitations, Scope, Preferences):  Full Scope Treatment  Psycho-social/Spiritual:   Desire for further Chaplaincy support:no  Additional Recommendations: Referral to Community Resources   Prognosis:   < 6 months in the setting of advanced heart failure, EF 20 to 25%, extensive coronary artery disease dating back to 1979 status post CABG, TAVR, CVA x3, prostate cancer, A. fib and now late effects CVA of recurrent aspiration pneumonia secondary to dysphasia.  Patient is high risk for recurrence of aspiration pneumonia and acute cardiopulmonary failure  Discharge Planning: To Be Determined      Primary Diagnoses: Present on Admission: . Acute respiratory failure with hypoxia ([place]) . Atrial fibrillation, chronic . Automatic implantable cardioverter-defibrillator in situ . Hypothyroidism . Osteoarthritis . Leukocytosis   I have reviewed the medical record, interviewed the patient and family, and examined the patient. The following aspects are pertinent.  Past Medical History:  Diagnosis Date  . Aortic stenosis, severe    WITH PERCUTANEOUS AORTIC VALVE (TAVI) IN March 2012 at the [facility]  . Aspiration into airway   . Cervical myelopathy ([place])   . Chronic  anticoagulation    on coumadin  . Chronic systolic CHF (congestive heart failure) ([place])   . Coronary artery disease    a.  s/p CABG 1979 and redo 1991.  Marland Kitchen Coughing   . Diverticulitis    CURRENTLY CONTROLLED WITH NO EVIDENCE OF RECURRENT INFECTION  . Esophageal stricture    WITH DILATATION  . Frailty   . GERD (gastroesophageal reflux disease)   . Heart murmur   . Hyperlipidemia   . Hyponatremia   . Ischemic cardiomyopathy 05/2010   Has EF of 25%  . MI (myocardial infarction) ([place]) 1974, 1978  . Osteoarthritis    RIGHT KNEE  . Osteoporosis   . Permanent atrial fibrillation    Has not tolerated amiodarone in the past. Amiodarone was stopped in September of 2010 due to side effects.  Marland Kitchen  Presence of cardioverter defibrillator   . Prostate cancer ([place])   . Pulmonary embolism ([place])   . Stroke [facility])    3 strokes  . VT (ventricular tachycardia) ([facility])    Social History   Socioeconomic History  . Marital status: Married    Spouse name: Not on file  . Number of children: Y  . Years of education: Not on file  . Highest education level: Not on file  Occupational History  . Occupation: retired. developer.     Employer: RETIRED  Social Needs  . Financial resource strain: Not on file  . Food insecurity:    Worry: Not on file    Inability: Not on file  . Transportation needs:    Medical: Not on file    Non-medical: Not on file  Tobacco Use  . Smoking status: Former Smoker    Packs/day: 4.00    Years: 15.00    Pack years: 60.00    Types: Cigarettes    Last attempt to quit: 06/15/1969    Years since quitting: 49.1  . Smokeless tobacco: Never Used  Substance and Sexual Activity  . Alcohol use: No  . Drug use: No  . Sexual activity: Not Currently  Lifestyle  . Physical activity:    Days per week: Not on file    Minutes per session: Not on file  . Stress: Not on file  Relationships  . Social connections:    Talks on phone: Not on file    Gets together: Not on file     Attends religious service: Not on file    Active member of club or organization: Not on file    Attends meetings of clubs or organizations: Not on file    Relationship status: Not on file  Other Topics Concern  . Not on file  Social History Narrative  . Not on file   Family History  Problem Relation Age of Onset  . Heart disease Mother 80  . Diabetes Mother 52  . Heart disease Father 20   Scheduled Meds: . albuterol  2.5 mg Nebulization QID  . alfuzosin  10 mg Oral QHS  . aspirin EC  81 mg Oral Daily  . carvedilol  6.25 mg Oral BID WC  . famotidine  40 mg Oral Daily  . ipratropium  2 spray Each Nare BID  . levothyroxine  125 mcg Oral QAC breakfast  . linaclotide  145 mcg Oral QAC breakfast  . mexiletine  150 mg Oral BID  . mometasone-formoterol  2 puff Inhalation BID  . nystatin  5 mL Oral QID  . saccharomyces boulardii  250 mg Oral Daily  . sodium chloride flush  3 mL Intravenous Q12H  . warfarin  2.5 mg Oral ONCE-1800  . Warfarin - Pharmacist Dosing Inpatient   Does not apply q1800   Continuous Infusions: . clindamycin (CLEOCIN) IV 300 mg (08/11/18 0905)   PRN Meds:.acetaminophen **OR** acetaminophen, cycloSPORINE, furosemide, lidocaine, ondansetron **OR** ondansetron (ZOFRAN) IV, traMADol Medications Prior to Admission:  Prior to Admission medications   Medication Sig Start Date End Date Taking? Authorizing Provider  alfuzosin (UROXATRAL) 10 MG 24 hr tablet Take 10 mg by mouth at bedtime.    Yes [provider]  aspirin EC 81 MG tablet Take 81 mg by mouth daily.   Yes [provider]  benzonatate (TESSALON) 200 MG capsule Take 200 mg by mouth 3 (three) times daily as needed for cough.    Yes [provider]  budesonide-formoterol (  SYMBICORT) 160-4.5 MCG/ACT inhaler Inhale 2 puffs into the lungs 2 (two) times daily.   Yes [provider]  carvedilol (COREG) 6.25 MG tablet TAKE 1 TABLET BY MOUTH TWICE DAILY WITH A MEAL. Patient taking  differently: Take 6.25 mg by mouth 2 (two) times daily with a meal.  07/01/18  Yes Sherren Mocha, MD  cholecalciferol (VITAMIN D) 1000 UNITS tablet Take 1,000 Units by mouth 2 (two) times daily.   Yes [provider]  clindamycin (CLEOCIN) 300 MG capsule Take 1 capsule (300 mg total) by mouth 3 (three) times daily for 4 days. 08/09/18 08/13/18 Yes Spongberg, Audie Pinto, MD  cycloSPORINE (RESTASIS) 0.05 % ophthalmic emulsion Place 1 drop into both eyes 2 (two) times daily as needed (dry eyes).   Yes [provider]  famotidine (PEPCID) 40 MG tablet Take 40 mg by mouth daily.   Yes [provider]  feeding supplement, ENSURE ENLIVE, (ENSURE ENLIVE) LIQD Take 237 mLs by mouth 2 (two) times daily between meals. 07/24/18  Yes Mercy Riding, MD  furosemide (LASIX) 20 MG tablet TAKE (1) TABLET DAILY AS NEEDED FOR SWELLING. Patient taking differently: Take 20 mg by mouth at bedtime.  06/19/16  Yes Sherren Mocha, MD  ipratropium (ATROVENT) 0.03 % nasal spray Place 2 sprays into both nostrils 2 (two) times daily.   Yes [provider]  levothyroxine (SYNTHROID) 125 MCG tablet Take 1 tablet (125 mcg total) by mouth daily before breakfast. 09/02/15  Yes Hosie Poisson, MD  lidocaine (LIDODERM) 5 % Place 1 patch onto the skin daily as needed (pain). Remove & Discard patch within 12 hours or as directed by MD   Yes [provider]  linaclotide (LINZESS) 145 MCG CAPS capsule Take 145 mcg by mouth daily before breakfast.   Yes [provider]  mexiletine (MEXITIL) 150 MG capsule Take 1 capsule (150 mg total) by mouth 2 (two) times a day. 07/07/18  Yes Cheryln Manly, NP  nitroGLYCERIN (NITROSTAT) 0.4 MG SL tablet Place 1 tablet (0.4 mg total) under the tongue every 5 (five) minutes x 3 doses as needed for chest pain (Max 3 doses in 15 min. Call 911). 12/06/16  Yes Sherren Mocha, MD  omeprazole (PRILOSEC) 20 MG capsule Take 20 mg by mouth daily as needed (acid  reflux).   Yes [provider]  polyethylene glycol (MIRALAX / GLYCOLAX) packet Take 17 g by mouth daily as needed for mild constipation.    Yes [provider]  Probiotic Product (PROBIOTIC DAILY PO) Take 1 capsule by mouth daily.   Yes [provider]  traMADol (ULTRAM) 50 MG tablet Take 50 mg by mouth every 6 (six) hours as needed for moderate pain.   Yes [provider]  warfarin (COUMADIN) 5 MG tablet Take 1-1.5 tablets (5-7.5 mg total) by mouth See admin instructions. Take 7.5 mg by mouth only on Fridays; all other days take 5mg  Patient taking differently: Take 5-7.5 mg by mouth See admin instructions. Take  1 1/2 tablet (7.5 mg) by mouth only on Friday evenings; take one tablet (5 mg) on all other evenings 07/07/18  Yes Cheryln Manly, NP   Allergies  Allergen Reactions  . Penicillins Shortness Of Breath and Rash    Has patient had a PCN reaction causing immediate rash, facial/tongue/throat swelling, SOB or lightheadedness with hypotension: Yes Has patient had a PCN reaction causing severe rash involving mucus membranes or skin necrosis: No Has patient had a PCN reaction that required hospitalization  No Has patient had a PCN reaction occurring within the last 10 years: No If all of the above answers are "NO", then may proceed with Cephalosporin use.   . Tizanidine Shortness Of Breath    Light headed  . Ace Inhibitors     Has not tolerated in the past due to hyperkalemia  . Antihistamines, Diphenhydramine-Type Other (See Comments)    Inhibits urination  . Clemastine Fumarate Other (See Comments)    Inhibits urination  . Dimenhydrinate Other (See Comments)    Inhibits urination  . Amiodarone Other (See Comments)    Unknown reaction   Review of Systems  Unable to perform ROS: Other    Physical Exam Vitals signs and nursing note reviewed.  Constitutional:      Appearance: He is ill-appearing.  HENT:     Head: Normocephalic and  atraumatic.  Cardiovascular:     Rate and Rhythm: Normal rate.  Pulmonary:     Comments: Short of breath with any exertion Musculoskeletal: Normal range of motion.  Skin:    General: Skin is warm and dry.     Findings: Bruising present.  Neurological:     Mental Status: He is alert and oriented to person, place, and time.  Psychiatric:        Mood and Affect: Mood normal.        Behavior: Behavior normal.     Vital Signs: BP 111/61 (BP Location: Right Arm)   Pulse 99   Temp 97.8 F (36.6 C) (Oral)   Resp (!) 22   Ht 6\' 2"  (1.88 m)   Wt 74.6 kg   SpO2 99%   BMI 21.12 kg/m  Pain Scale: 0-10   Pain Score: 0-No pain   SpO2: SpO2: 99 % O2 Device:SpO2: 99 % O2 Flow Rate: .O2 Flow Rate (L/min): 4 L/min  IO: Intake/output summary:   Intake/Output Summary (Last 24 hours) at 08/11/2018 1646 Last data filed at 08/11/2018 0600 Gross per 24 hour  Intake 270 ml  Output 350 ml  Net -80 ml    LBM:   Baseline Weight: Weight: 74.6 kg Most recent weight: Weight: 74.6 kg     Palliative Assessment/Data:   Flowsheet Rows     Most Recent Value  Intake Tab  Referral Department  Hospitalist  Unit at Time of Referral  Med/Surg Unit  Palliative Care Primary Diagnosis  Pulmonary  Date Notified  08/11/18  Palliative Care Type  New Palliative care  Reason for referral  Clarify Goals of Care, Psychosocial or Spiritual support  Date of Admission  08/10/18  Date first seen by Palliative Care  08/11/18  # of days Palliative referral response time  0 Day(s)  # of days IP prior to Palliative referral  1  Clinical Assessment  Palliative Performance Scale Score  40%  Pain Max last 24 hours  Not able to report  Pain Min Last 24 hours  Not able to report  Dyspnea Max Last 24 Hours  Not able to report  Dyspnea Min Last 24 hours  Not able to report  Nausea Max Last 24 Hours  Not able to report  Nausea Min Last 24 Hours  Not able to report  Anxiety Max Last 24 Hours  Not able to report   Anxiety Min Last 24 Hours  Not able to report  Other Max Last 24 Hours  Not able to report  Psychosocial & Spiritual Assessment  Palliative Care Outcomes  Patient/Family meeting held?  Yes  Who was  at the meeting?  pt  Palliative Care follow-up planned  Yes, Facility      Time In: 1600 Time Out: 1800 Time Total: 120 min Greater than 50%  of this time was spent counseling and coordinating care related to the above assessment and plan.  Signed by: Dory Horn, NP   Please contact Palliative Medicine Team phone at [phone] for questions and concerns.  For individual provider: See Shea Evans

## 2018-08-11 NOTE — Evaluation (Signed)
Physical Therapy Evaluation Patient Details Name: [name] MRN: [id number] DOB: [birth year] Today's Date: 08/11/2018   History of Present Illness  [name] is a 85 y.o. male with medical history significant of CAD s/p CABG in 1979 with redo 1991, permanent atrial fibrillation on Coumadin, CVA, dysphagia, prostate cancer s/p radiation, BPH, arthritis, and hyponatremia; who presents with complaints of shortness of breath    Clinical Impression  Pt admitted with above diagnosis. Pt currently with functional limitations due to the deficits listed below (see PT Problem List). PTA, pt reports living at home with wife, utilizing RW for household ambulation. Reports he has fallen within last 1-2 weeks was unable to tell me more details, with L hip pain and weakness. Today states he feels to weak to return home, stood EOB with several premature sits and Mod assistance.   Pt will benefit from skilled PT to increase their independence and safety with mobility to allow discharge to the venue listed below.       Follow Up Recommendations SNF;Supervision/Assistance - 24 hour    Equipment Recommendations  None recommended by PT    Recommendations for Other Services       Precautions / Restrictions Precautions Precautions: Fall Restrictions Weight Bearing Restrictions: No      Mobility  Bed Mobility Overal bed mobility: Needs Assistance Bed Mobility: Supine to Sit;Sit to Supine     Supine to sit: Min assist        Transfers Overall transfer level: Needs assistance Equipment used: Rolling walker (2 wheeled) Transfers: Sit to/from Stand Sit to Stand: From elevated surface;Mod assist         General transfer comment: sit to stand x2 with sid stepping, poor balance premature sit back onto bed.   Ambulation/Gait             General Gait Details: deferred  Stairs            Wheelchair Mobility    Modified Rankin (Stroke Patients Only)       Balance Overall  balance assessment: Needs assistance   Sitting balance-Leahy Scale: Fair       Standing balance-Leahy Scale: Poor                               Pertinent Vitals/Pain Faces Pain Scale: Hurts little more Pain Location: L hip  Pain Descriptors / Indicators: Aching Pain Intervention(s): Limited activity within patient's tolerance    Home Living Family/patient expects to be discharged to:: Private residence Living Arrangements: Spouse/significant other Available Help at Discharge: Family;Available 24 hours/day Type of Home: House Home Access: Stairs to enter Entrance Stairs-Rails: Right Entrance Stairs-Number of Steps: 4 Home Layout: Two level;Able to live on main level with bedroom/bathroom Home Equipment: Gilford Rile - 2 wheels;Walker - 4 wheels;Bedside commode;Grab bars - tub/shower;Grab bars - toilet;Shower seat Additional Comments: Has lived on first floor of home for years    Prior Function Level of Independence: Needs assistance;Independent with assistive device(s)   Gait / Transfers Assistance Needed: Mod indep with rollator  ADL's / Homemaking Assistance Needed: Reports indep with ADLs; wife assists with IADLs        Hand Dominance   Dominant Hand: Right    Extremity/Trunk Assessment   Upper Extremity Assessment Upper Extremity Assessment: Generalized weakness    Lower Extremity Assessment Lower Extremity Assessment: Generalized weakness       Communication   Communication: HOH  Cognition Arousal/Alertness: Awake/alert  Behavior During Therapy: WFL for tasks assessed/performed Overall Cognitive Status: No family/caregiver present to determine baseline cognitive functioning Area of Impairment: Attention;Memory;Following commands;Safety/judgement;Awareness;Problem solving                 Orientation Level: Disoriented to;Time;Situation   Memory: Decreased short-term memory Following Commands: Follows one step commands inconsistently      Problem Solving: Difficulty sequencing;Requires verbal cues;Requires tactile cues        General Comments      Exercises     Assessment/Plan    PT Assessment Patient needs continued PT services  PT Problem List Decreased strength;Decreased activity tolerance;Decreased balance;Decreased mobility;Decreased safety awareness       PT Treatment Interventions DME instruction;Gait training;Stair training;Functional mobility training;Therapeutic activities;Therapeutic exercise;Balance training;Patient/family education    PT Goals (Current goals can be found in the Care Plan section)  Acute Rehab PT Goals Patient Stated Goal: Return home PT Goal Formulation: With patient Time For Goal Achievement: 08/18/18 Potential to Achieve Goals: Good    Frequency Min 3X/week   Barriers to discharge        Co-evaluation               AM-PAC PT "6 Clicks" Mobility  Outcome Measure Help needed turning from your back to your side while in a flat bed without using bedrails?: A Little Help needed moving from lying on your back to sitting on the side of a flat bed without using bedrails?: A Little Help needed moving to and from a bed to a chair (including a wheelchair)?: A Little Help needed standing up from a chair using your arms (e.g., wheelchair or bedside chair)?: A Lot Help needed to walk in hospital room?: A Lot Help needed climbing 3-5 steps with a railing? : A Lot 6 Click Score: 15    End of Session Equipment Utilized During Treatment: Gait belt Activity Tolerance: Patient tolerated treatment well Patient left: in bed;with call bell/phone within reach;with bed alarm set Nurse Communication: Mobility status PT Visit Diagnosis: Other abnormalities of gait and mobility (R26.89);Muscle weakness (generalized) (M62.81)    Time: 7169-6789 PT Time Calculation (min) (ACUTE ONLY): 30 min   Charges:   PT Evaluation $PT Eval Moderate Complexity: 1 Mod PT Treatments $Therapeutic  Activity: 8-22 mins        Reinaldo Berber, PT, DPT Acute Rehabilitation Services Pager: [phone] Office: [phone]    Reinaldo Berber 08/11/2018, 11:01 AM

## 2018-08-11 NOTE — Progress Notes (Signed)
ANTICOAGULATION CONSULT NOTE - Initial Consult  Pharmacy Consult for warfarin Indication: atrial fibrillation  Vital Signs: Temp: 97.8 F (36.6 C) (05/31 0752) Temp Source: Oral (05/31 0752) BP: 111/61 (05/31 0752) Pulse Rate: 95 (05/31 0829)  Labs: Recent Labs    08/09/18 0623 08/10/18 1323 08/10/18 1328 08/11/18 0541  HGB  --  11.8*  --   --   HCT  --  35.8*  --   --   PLT  --  231  --   --   LABPROT 26.4*  --  27.5* 30.0*  INR 2.5*  --  2.6* 2.9*  CREATININE 0.91 1.04  --   --    Estimated Creatinine Clearance: 55.8 mL/min (by C-G formula based on SCr of 1.04 mg/dL).  Medical History: Past Medical History:  Diagnosis Date  . Aortic stenosis, severe    WITH PERCUTANEOUS AORTIC VALVE (TAVI) IN March 2012 at the [facility]  . Aspiration into airway   . Cervical myelopathy ([place])   . Chronic anticoagulation    on coumadin  . Chronic systolic CHF (congestive heart failure) ([place])   . Coronary artery disease    a.  s/p CABG 1979 and redo 1991.  Marland Kitchen Coughing   . Diverticulitis    CURRENTLY CONTROLLED WITH NO EVIDENCE OF RECURRENT INFECTION  . Esophageal stricture    WITH DILATATION  . Frailty   . GERD (gastroesophageal reflux disease)   . Heart murmur   . Hyperlipidemia   . Hyponatremia   . Ischemic cardiomyopathy 05/2010   Has EF of 25%  . MI (myocardial infarction) ([place]) 1974, 1978  . Osteoarthritis    RIGHT KNEE  . Osteoporosis   . Permanent atrial fibrillation    Has not tolerated amiodarone in the past. Amiodarone was stopped in September of 2010 due to side effects.  . Presence of cardioverter defibrillator   . Prostate cancer ([place])   . Pulmonary embolism ([place])   . Stroke [facility])    3 strokes  . VT (ventricular tachycardia) ([facility])     Assessment: 71 yom presented to the hospital with SOB. He is on chronic warfarin for history of afib. INR is therapeutic today at 2.6. Hgb is slightly low but platelets are WNL. No bleeding noted.  PTA dose: 5mg  daily  except 7.5mg  on Fridays Note no Warfarin charted 5/29, taken at home? Re-admit 5/30 with [facility], prev admit 5/26-29 for asp PNA   Goal of Therapy:  INR 2.5-3 Monitor platelets by anticoagulation protocol: Yes   Plan:  Warfarin 2.5mg  PO x 1 tonight Daily INR  Minda Ditto  PharmD [phone] 08/11/2018,10:14 AM

## 2018-08-12 LAB — BASIC METABOLIC PANEL
Anion gap: 10 (ref 5–15)
BUN: 14 mg/dL (ref 8–23)
CO2: 25 mmol/L (ref 22–32)
Calcium: 8.1 mg/dL — ABNORMAL LOW (ref 8.9–10.3)
Chloride: 91 mmol/L — ABNORMAL LOW (ref 98–111)
Creatinine, Ser: 0.88 mg/dL (ref 0.61–1.24)
GFR calc Af Amer: >60 mL/min (ref >60)
GFR calc non Af Amer: >60 mL/min (ref >60)
Glucose, Bld: 103 mg/dL — ABNORMAL HIGH (ref 70–99)
Potassium: 4 mmol/L (ref 3.5–5.1)
Sodium: 126 mmol/L — ABNORMAL LOW (ref 135–145)

## 2018-08-12 LAB — PROTIME-INR
INR: 5.1 (ref 0.8–1.2)
Prothrombin Time: 46.1 seconds — ABNORMAL HIGH (ref 11.4–15.2)

## 2018-08-12 MED ORDER — ALBUTEROL SULFATE (2.5 MG/3ML) 0.083% IN NEBU
2.5000 mg | INHALATION_SOLUTION | Freq: Three times a day (TID) | RESPIRATORY_TRACT | Status: DC
  Administered 2018-08-12 – 2018-08-14 (×6): 2.5 mg via RESPIRATORY_TRACT
  Filled 2018-08-12 (×5): qty 3

## 2018-08-12 NOTE — Progress Notes (Signed)
[place] for warfarin Indication: atrial fibrillation, hx of CVA  Vital Signs: Temp: 97.6 F (36.4 C) (05/31 2348) Temp Source: Oral (05/31 2348) BP: 106/69 (05/31 2348) Pulse Rate: 110 (05/31 2348)  Labs: Recent Labs    08/10/18 1323 08/10/18 1328 08/11/18 0541 08/11/18 1136 08/12/18 0622  HGB 11.8*  --   --  10.7*  --   HCT 35.8*  --   --  32.2*  --   PLT 231  --   --  222  --   LABPROT  --  27.5* 30.0*  --  46.1*  INR  --  2.6* 2.9*  --  5.1*  CREATININE 1.04  --   --  0.81 0.88   Estimated Creatinine Clearance: 65.9 mL/min (by C-G formula based on SCr of 0.88 mg/dL).  Medical History: Past Medical History:  Diagnosis Date  . Aortic stenosis, severe    WITH PERCUTANEOUS AORTIC VALVE (TAVI) IN March 2012 at the [facility]  . Aspiration into airway   . Cervical myelopathy ([place])   . Chronic anticoagulation    on coumadin  . Chronic systolic CHF (congestive heart failure) ([place])   . Coronary artery disease    a.  s/p CABG 1979 and redo 1991.  Marland Kitchen Coughing   . Diverticulitis    CURRENTLY CONTROLLED WITH NO EVIDENCE OF RECURRENT INFECTION  . Esophageal stricture    WITH DILATATION  . Frailty   . GERD (gastroesophageal reflux disease)   . Heart murmur   . Hyperlipidemia   . Hyponatremia   . Ischemic cardiomyopathy 05/2010   Has EF of 25%  . MI (myocardial infarction) ([place]) 1974, 1978  . Osteoarthritis    RIGHT KNEE  . Osteoporosis   . Permanent atrial fibrillation    Has not tolerated amiodarone in the past. Amiodarone was stopped in September of 2010 due to side effects.  . Presence of cardioverter defibrillator   . Prostate cancer ([place])   . Pulmonary embolism ([place])   . Stroke [facility])    3 strokes  . VT (ventricular tachycardia) ([place])     Assessment: 28 y/oM with recent admission 5/26-5/29 for acute respiratory failure with hypoxia secondary to aspiration pneumonia re-admitted on 5/30 with SOB. He is on chronic  warfarin for history of a-fib, CVA. Pharmacy asked to assist with warfarin dosing while patient admitted.   PTA dose: 5mg  daily except 7.5mg  on Fridays  Today, 08/12/18:  INR increased to 5.1, now SUPRAtherapeutic  CBC: Hgb 10.7, Pltc WNL (5/31)  No bleeding issues noted per nursing  Goal of Therapy:  INR 2.5-3 Monitor platelets by anticoagulation protocol: Yes   Plan:  No warfarin today Daily PT/INR CBC in AM Monitor closely for s/sx of bleeding   Lindell Spar, PharmD, BCPS Clinical Pharmacist  [phone] 08/12/2018,8:49 AM

## 2018-08-12 NOTE — Progress Notes (Signed)
Manufacturing engineer [facility]) Hospice  Received request for patient/family interest in hospice services at home after discharge. Chart reviewed and eligibility has been confirmed by [facility] physician.   Spoke with spouse by phone to confirm interest and explain services. She requested hospital bed, over bed table, wheelchair and oxygen setup to be delivered to home tomorrow afternoon. She is the contact to coordinate with [employer] for DME delivery. Per discussion with RNCM Debra patient will need ambulance transport home.   Please send home with patient scripts for comfort medications.   [facility] referral [facility] specialists aware of this referral and will contact spouse to arrange first visit. Please notify [facility] when patient is ready for discharge.   Spouse has [facility] contact information.   Please call with hospice related questions.  Thank you,  Erling Conte, LCSW [phone]

## 2018-08-12 NOTE — Progress Notes (Signed)
Physical Therapy Treatment Patient Details Name: [name] MRN: [id number] DOB: 04/11/1932 Today's Date: 08/12/2018    History of Present Illness ALMANDO BRAWLEY is a 85 y.o. male with medical history significant of CAD s/p CABG in 1979 with redo 1991, permanent atrial fibrillation on Coumadin, CVA, dysphagia, prostate cancer s/p radiation, BPH, arthritis, and hyponatremia; who presents with complaints of shortness of breath    PT Comments    Continuing work on functional mobility and activity tolerance;  Small, but notable improvements in mobility and activity tolerance; Mr. Robicheaux was able to take a few steps today, and there were no instances of premature sitting; SNF for post-acute rehab remains appropriate; If he is very bent on getting home, it will be worth considering the [place] [facility]   Follow Up Recommendations  SNF;Supervision/Assistance - 24 hour;Other (comment)(Can also consider [facility])     Equipment Recommendations  None recommended by PT    Recommendations for Other Services       Precautions / Restrictions Precautions Precautions: Fall    Mobility  Bed Mobility Overal bed mobility: Needs Assistance Bed Mobility: Supine to Sit     Supine to sit: Min assist     General bed mobility comments: Min A to come to EOB  for LE and trunk management  Transfers Overall transfer level: Needs assistance Equipment used: Rolling walker (2 wheeled) Transfers: Sit to/from Stand Sit to Stand: From elevated surface;Mod assist         General transfer comment: Cues for hand placement and safety; pt requesting to raise the bed up; Light mod assist to control descent to sit  Ambulation/Gait Ambulation/Gait assistance: Min guard Gait Distance (Feet): 5 Feet Assistive device: Rolling walker (2 wheeled) Gait Pattern/deviations: Decreased step length - right;Decreased step length - left;Decreased stride length;Trunk flexed     General Gait  Details: Close guard for safety; very kyphotic posture   Stairs             Wheelchair Mobility    Modified Rankin (Stroke Patients Only)       Balance                                            Cognition Arousal/Alertness: Awake/alert Behavior During Therapy: WFL for tasks assessed/performed Overall Cognitive Status: No family/caregiver present to determine baseline cognitive functioning Area of Impairment: Attention;Memory;Following commands;Safety/judgement;Awareness;Problem solving                       Following Commands: Follows one step commands consistently     Problem Solving: Requires verbal cues;Requires tactile cues        Exercises      General Comments General comments (skin integrity, edema, etc.): Seated activity on room air and O2 sats remained greater than or equal to 92%; Desatted on room air with limited walking/standing activity; got to 83% observed lowest, slowly came back to acceptable levels with seated rest and restarting supplemental O2 via [place] 3L      Pertinent Vitals/Pain Pain Assessment: Faces Faces Pain Scale: Hurts little more Pain Location: L hand at a skin tear site Pain Descriptors / Indicators: Grimacing;Guarding Pain Intervention(s): Monitored during session    Home Living                      Prior Function  PT Goals (current goals can now be found in the care plan section) Acute Rehab PT Goals Patient Stated Goal: Return home PT Goal Formulation: With patient Time For Goal Achievement: 08/18/18 Potential to Achieve Goals: Good Progress towards PT goals: Progressing toward goals    Frequency    Min 3X/week      PT Plan Current plan remains appropriate;Other (comment)(and it may be worth considering [facility])    Co-evaluation              AM-PAC PT "6 Clicks" Mobility   Outcome Measure  Help needed turning from your back to your side  while in a flat bed without using bedrails?: A Little Help needed moving from lying on your back to sitting on the side of a flat bed without using bedrails?: A Little Help needed moving to and from a bed to a chair (including a wheelchair)?: A Little Help needed standing up from a chair using your arms (e.g., wheelchair or bedside chair)?: A Lot Help needed to walk in hospital room?: A Lot Help needed climbing 3-5 steps with a railing? : A Lot 6 Click Score: 15    End of Session Equipment Utilized During Treatment: Gait belt Activity Tolerance: Patient tolerated treatment well Patient left: in chair;with call bell/phone within reach;with chair alarm set;with nursing/sitter in room(Pt's Nurse Tech cahnging bed linens) Nurse Communication: Mobility status PT Visit Diagnosis: Other abnormalities of gait and mobility (R26.89);Muscle weakness (generalized) (M62.81)     Time: 5397-6734 PT Time Calculation (min) (ACUTE ONLY): 25 min  Charges:  $Gait Training: 8-22 mins $Therapeutic Activity: 8-22 mins                     Roney Marion, [place] Pager [phone] Office [place] 08/12/2018, 1:45 PM

## 2018-08-12 NOTE — TOC Progression Note (Addendum)
Transition of Care [facility]) - Progression Note    Patient Details  Name: [name] MRN: [id number] Date of Birth: 04/21/1932  [facility]) CM/SW Contact  Zenon Mayo, RN Phone Number: 08/12/2018, 2:09 PM  Clinical Narrative:    Patient for dc home with Hospice, NCM spoke with wife, she chose [facility]. Referral made to [name].  NCM received call from [facility], states they will be delivering hosp bed, w/chair, overbed table and oxygen.  He will need ambulance transport home, address confirmed with wife.  She seems to think she will be able to take him home by car, NCM informed her that he will need ambulance, and she states she will see how he is  when he is ready to be dc. DME will be delivered on 6/2 and wife states she has to go find storage to put furniture in to make room for DME so she will be ready for him to come home on Wed.    Expected Discharge Plan: Home w Hospice Care Barriers to Discharge: Other (comment)(wife having to find storage for furnitur for  DME  to be delivered)  Expected Discharge Plan and Services Expected Discharge Plan: [place]   Discharge Planning Services: CM Consult Post Acute Care Choice: Resumption of Svcs/PTA Provider Living arrangements for the past 2 months: Single Family Home Expected Discharge Date: 08/13/18               DME Arranged: Hospital bed, Oxygen, Wheelchair manual(overbed table) DME Agency: [facility] [place] Date DME Agency Contacted: 08/12/18 Time DME Agency Contacted: [phone] Representative spoke with at DME Agency: [facility] HH Arranged: RN [facility] Agency: [facility]ospice and [place] Date [place]: 08/12/18 Time [place]: [phone] Representative spoke with at [place]: [place] ([place]) Interventions    Readmission Risk Interventions No flowsheet data found.

## 2018-08-12 NOTE — Progress Notes (Signed)
PROGRESS NOTE    [name]  HYW:[id number] DOB: 06-May-1932 DOA: 08/10/2018 PCP: [name], MD   Brief Narrative:  Per HPI: [name] is a 85 y.o. male with medical history significant of CADs/p CABG in 1979 with redo 1991, permanent atrial fibrillation on Coumadin, CVA, dysphagia, prostate cancers/pradiation, BPH, arthritis, and hyponatremia;who presents with complaints of shortness of breath.  Patient had just recently been hospitalized from 5/26-5/29, for acute respiratory failure with hypoxia secondary to aspiration pneumonia.  It appears during hospitalization it was recommended for patient go to skilled nursing facility, but ultimately patient was discharged back home with his wife and advance home care services.  For the aspiration pneumonia he was sent home with a 4-day course of clindamycin.  He took a dose this morning.  Family notes that he did have difficulty swallowing the clindamycin capsules.  At home family noted that this morning he was struggling to breathe.  O2 saturations were noted to drop into the 70s for which EMS was called.  Family report that he needs to be on oxygen, but it was not set up at discharge. Wife also reports needing a hospital bed and wheelchair to help care for him at home.  At baseline patient is not very mobile due to severe arthritis with bone-on-bone.  Upon EMS arrival O2 saturations 78% on room air, and improved to 99% on 6 L nasal cannula oxygen.  Patient was admitted with acute on chronic hypoxemic respiratory failure secondary to recurrent aspiration pneumonia and has been started on IV clindamycin with plans to go home on hospice on 6/3.   Assessment & Plan:   Principal Problem:   Acute respiratory failure with hypoxia (HCC) Active Problems:   Osteoarthritis   Atrial fibrillation, chronic   Anticoagulated on Coumadin   Automatic implantable cardioverter-defibrillator in situ   Goals of care, counseling/discussion   Hypothyroidism    Chronic systolic CHF (congestive heart failure) (HCC)   Recurrent aspiration pneumonia (HCC)   Dysphagia   Leukocytosis   Palliative care by specialist   FTT (failure to thrive) in adult   Acute on chronic respiratory failure with hypoxia secondary to recurrent aspiration pneumonia: He presents to the hospital after being discharged yesterday with complaints of respiratory distress. At home O2 sats in the 70s on room air. Chest x-ray showing increased airspace disease in left lung base.  Patient had been prescribed 4-day course of clindamycin at discharge. Family notes patient having difficulty swallowing the clindamycin. -currently on clindamycin from p.o. to IV -Incentive spirometry every hour while awake -improving, will ambulate check 02 sats on room air, arrange home o2 if meet criteria  COPD: Patientwith some mild wheezing noted only in the left lung fieldonly initially, today no wheezing  -Continue Symbicort/pharmacy substitution -Albuterol nebs 4 times daily  History of CVA, dysphagia:Chronic.Patienthas history of esophageal stricture,and just feeling had repeat modified barium swallow performed on 5/27. Dysphagia 1(pure)diet with thin liquids recommended. -Dysphasia 1diet  -Medications in applesauce -contineu asa  GERD -Continue Pepcid  Chronicatrial fibrillationonchronic anticoagulation:Heart rates into the 120s.INR therapeutic at 2.6. -Continue Coregand mexiletine -Coumadin per pharmacy    Chronic systolicCHF:History of ventricular tachyarrhythmias/pAICD, EF noted to be 20 to 25% with diffuse hypokinesis in 12/2017. He is on coreg and mexiletine at home which are continued Patient appears euvolemic to dryat this time. -Strict intake and output and daily weights -Lasix as needed  Hyponatremia: Chronic. Sodium 127on admission, today is 126 which appears near his baseline. -Continue to monitor  Hypothyroidism:Stable. Last  TSH 3.768 on 5/12. -Continue levothyroxine  Debility with presumed dementia/FTT/frequent hospitalizations:  During last admission it appears son felt that the patient's wife was unable to care for him at home. PT/OT evaluated the patient and recommended skilled nursing facility placement, but wife ultimately decided against this. -Care management consultfor needs of hospital bed, wheelchair, and oxygen -palliative care consulted and family members agree to hospice on DC by 6/3 when home equipment has been set up for hospice care.  Oral thrush, topical nystatin    DVT prophylaxis: Coumadin Code Status: DNR Family Communication: Wife on phone Disposition Plan: Continue IV abx and plan for DC to home with hospice when family ready on 6/3.   Consultants:   Palliative Care  Procedures:   None  Antimicrobials:  Anti-infectives (From admission, onward)   Start     Dose/Rate Route Frequency Ordered Stop   08/10/18 1700  clindamycin (CLEOCIN) IVPB 300 mg     300 mg 100 mL/hr over 30 Minutes Intravenous Every 8 hours 08/10/18 1639     08/10/18 1645  clindamycin (CLEOCIN) capsule 300 mg  Status:  Discontinued     300 mg Oral 3 times daily 08/10/18 1632 08/10/18 1638        Subjective: Patient seen and evaluated today with no new acute complaints or concerns. No acute concerns or events noted overnight.  Objective: Vitals:   08/11/18 2348 08/12/18 0832 08/12/18 0934 08/12/18 1410  BP: 106/69  93/65   Pulse: (!) 110  (!) 104   Resp: 18     Temp: 97.6 F (36.4 C)  (!) 97.5 F (36.4 C)   TempSrc: Oral  Oral   SpO2: 95% 92% 94% 96%  Weight:      Height:        Intake/Output Summary (Last 24 hours) at 08/12/2018 1425 Last data filed at 08/11/2018 1748 Gross per 24 hour  Intake 50 ml  Output -  Net 50 ml   Filed Weights   08/10/18 1848  Weight: 74.6 kg    Examination:  General exam: Appears calm and comfortable  Respiratory system: Clear to auscultation.  Respiratory effort normal. On 5L [place]. Cardiovascular system: S1 & S2 heard, RRR. No JVD, murmurs, rubs, gallops or clicks. No pedal edema. Gastrointestinal system: Abdomen is nondistended, soft and nontender. No organomegaly or masses felt. Normal bowel sounds heard. Central nervous system: Alert and oriented. No focal neurological deficits. Extremities: Symmetric 5 x 5 power. Skin: No rashes, lesions or ulcers Psychiatry: Judgement and insight appear normal. Mood & affect appropriate.     Data Reviewed: I have personally reviewed following labs and imaging studies  CBC: Recent Labs  Lab 08/06/18 1008 08/07/18 0426 08/08/18 0420 08/10/18 1323 08/11/18 1136  WBC 9.6 13.5* 13.0* 10.8* 10.0  NEUTROABS 8.5*  --  12.2*  --  8.2*  HGB 12.4* 10.7* 10.7* 11.8* 10.7*  HCT 38.4* 31.8* 31.2* 35.8* 32.2*  MCV 93.7 90.6 88.6 93.0 91.5  PLT 274 217 229 231 540   Basic Metabolic Panel: Recent Labs  Lab 08/08/18 0420 08/09/18 0623 08/10/18 1323 08/11/18 1136 08/12/18 0622  NA 124* 127* 127* 128* 126*  K 4.6 4.4 4.4 4.1 4.0  CL 93* 88* 90* 94* 91*  CO2 22 25 24 24 25   GLUCOSE 102* 98 86 108* 103*  BUN 20 17 16 15 14   CREATININE 0.95 0.91 1.04 0.81 0.88  CALCIUM 8.6* 8.9 8.6* 8.2* 8.1*  MG  --   --   --  2.1  --    GFR: Estimated Creatinine Clearance: 65.9 mL/min (by C-G formula based on SCr of 0.88 mg/dL). Liver Function Tests: No results for input(s): AST, ALT, ALKPHOS, BILITOT, PROT, ALBUMIN in the last 168 hours. No results for input(s): LIPASE, AMYLASE in the last 168 hours. No results for input(s): AMMONIA in the last 168 hours. Coagulation Profile: Recent Labs  Lab 08/08/18 0420 08/09/18 0623 08/10/18 1328 08/11/18 0541 08/12/18 0622  INR 2.7* 2.5* 2.6* 2.9* 5.1*   Cardiac Enzymes: Recent Labs  Lab 08/06/18 1008  TROPONINI <0.03   BNP (last 3 results) No results for input(s): PROBNP in the last 8760 hours. HbA1C: No results for input(s): HGBA1C in the last 72  hours. CBG: No results for input(s): GLUCAP in the last 168 hours. Lipid Profile: No results for input(s): CHOL, HDL, LDLCALC, TRIG, CHOLHDL, LDLDIRECT in the last 72 hours. Thyroid Function Tests: No results for input(s): TSH, T4TOTAL, FREET4, T3FREE, THYROIDAB in the last 72 hours. Anemia Panel: No results for input(s): VITAMINB12, FOLATE, FERRITIN, TIBC, IRON, RETICCTPCT in the last 72 hours. Sepsis Labs: No results for input(s): PROCALCITON, LATICACIDVEN in the last 168 hours.  Recent Results (from the past 240 hour(s))  SARS Coronavirus 2 (CEPHEID- Performed in [place] hospital lab), Hosp Order     Status: None   Collection Time: 08/06/18 10:41 AM  Result Value Ref Range Status   SARS Coronavirus 2 NEGATIVE NEGATIVE Final    Comment: (NOTE) If result is NEGATIVE SARS-CoV-2 target nucleic acids are NOT DETECTED. The SARS-CoV-2 RNA is generally detectable in upper and lower  respiratory specimens during the acute phase of infection. The lowest  concentration of SARS-CoV-2 viral copies this assay can detect is 250  copies / mL. A negative result does not preclude SARS-CoV-2 infection  and should not be used as the sole basis for treatment or other  patient management decisions.  A negative result may occur with  improper specimen collection / handling, submission of specimen other  than nasopharyngeal swab, presence of viral mutation(s) within the  areas targeted by this assay, and inadequate number of viral copies  (<250 copies / mL). A negative result must be combined with clinical  observations, patient history, and epidemiological information. If result is POSITIVE SARS-CoV-2 target nucleic acids are DETECTED. The SARS-CoV-2 RNA is generally detectable in upper and lower  respiratory specimens dur ing the acute phase of infection.  Positive  results are indicative of active infection with SARS-CoV-2.  Clinical  correlation with patient history and other diagnostic  information is  necessary to determine patient infection status.  Positive results do  not rule out bacterial infection or co-infection with other viruses. If result is PRESUMPTIVE POSTIVE SARS-CoV-2 nucleic acids MAY BE PRESENT.   A presumptive positive result was obtained on the submitted specimen  and confirmed on repeat testing.  While 2019 novel coronavirus  (SARS-CoV-2) nucleic acids may be present in the submitted sample  additional confirmatory testing may be necessary for epidemiological  and / or clinical management purposes  to differentiate between  SARS-CoV-2 and other Sarbecovirus currently known to infect humans.  If clinically indicated additional testing with an alternate test  methodology [phone]) is advised. The SARS-CoV-2 RNA is generally  detectable in upper and lower respiratory sp ecimens during the acute  phase of infection. The expected result is Negative. Fact Sheet for Patients:  StrictlyIdeas.no Fact Sheet for Healthcare Providers: BankingDealers.co.za This test is not yet approved or cleared by the [place]  States FDA and has been authorized for detection and/or diagnosis of SARS-CoV-2 by FDA under an Emergency Use Authorization (EUA).  This EUA will remain in effect (meaning this test can be used) for the duration of the COVID-19 declaration under Section 564(b)(1) of the Act, 21 U.S.C. section 360bbb-3(b)(1), unless the authorization is terminated or revoked sooner. Performed at [facility], [place] [address], [place], [place] [address]   SARS Coronavirus 2 (CEPHEID - Performed in [place] hospital lab), Hosp Order     Status: None   Collection Time: 08/10/18  4:11 PM  Result Value Ref Range Status   SARS Coronavirus 2 NEGATIVE NEGATIVE Final    Comment: (NOTE) If result is NEGATIVE SARS-CoV-2 target nucleic acids are NOT DETECTED. The SARS-CoV-2 RNA is generally detectable in upper and lower   respiratory specimens during the acute phase of infection. The lowest  concentration of SARS-CoV-2 viral copies this assay can detect is 250  copies / mL. A negative result does not preclude SARS-CoV-2 infection  and should not be used as the sole basis for treatment or other  patient management decisions.  A negative result may occur with  improper specimen collection / handling, submission of specimen other  than nasopharyngeal swab, presence of viral mutation(s) within the  areas targeted by this assay, and inadequate number of viral copies  (<250 copies / mL). A negative result must be combined with clinical  observations, patient history, and epidemiological information. If result is POSITIVE SARS-CoV-2 target nucleic acids are DETECTED. The SARS-CoV-2 RNA is generally detectable in upper and lower  respiratory specimens dur ing the acute phase of infection.  Positive  results are indicative of active infection with SARS-CoV-2.  Clinical  correlation with patient history and other diagnostic information is  necessary to determine patient infection status.  Positive results do  not rule out bacterial infection or co-infection with other viruses. If result is PRESUMPTIVE POSTIVE SARS-CoV-2 nucleic acids MAY BE PRESENT.   A presumptive positive result was obtained on the submitted specimen  and confirmed on repeat testing.  While 2019 novel coronavirus  (SARS-CoV-2) nucleic acids may be present in the submitted sample  additional confirmatory testing may be necessary for epidemiological  and / or clinical management purposes  to differentiate between  SARS-CoV-2 and other Sarbecovirus currently known to infect humans.  If clinically indicated additional testing with an alternate test  methodology [phone]) is advised. The SARS-CoV-2 RNA is generally  detectable in upper and lower respiratory sp ecimens during the acute  phase of infection. The expected result is Negative. Fact  Sheet for Patients:  StrictlyIdeas.no Fact Sheet for Healthcare Providers: BankingDealers.co.za This test is not yet approved or cleared by the [place] FDA and has been authorized for detection and/or diagnosis of SARS-CoV-2 by FDA under an Emergency Use Authorization (EUA).  This EUA will remain in effect (meaning this test can be used) for the duration of the COVID-19 declaration under Section 564(b)(1) of the Act, 21 U.S.C. section 360bbb-3(b)(1), unless the authorization is terminated or revoked sooner. Performed at [facility], [place] [address] [place], [place] [address]          Radiology Studies: No results found.      Scheduled Meds: . albuterol  2.5 mg Nebulization TID  . alfuzosin  10 mg Oral QHS  . aspirin EC  81 mg Oral Daily  . carvedilol  6.25 mg Oral BID WC  . famotidine  40 mg Oral  Daily  . ipratropium  2 spray Each Nare BID  . levothyroxine  125 mcg Oral QAC breakfast  . linaclotide  145 mcg Oral QAC breakfast  . mexiletine  150 mg Oral BID  . mometasone-formoterol  2 puff Inhalation BID  . nystatin  5 mL Oral QID  . saccharomyces boulardii  250 mg Oral Daily  . sodium chloride flush  3 mL Intravenous Q12H  . Warfarin - Pharmacist Dosing Inpatient   Does not apply q1800   Continuous Infusions: . clindamycin (CLEOCIN) IV 300 mg (08/12/18 0942)     LOS: 1 day    Time spent: 30 minutes    Pratik Darleen Crocker, DO [facility] Pager [phone]  If 7PM-7AM, please contact night-coverage [email or web address] Password [email or web address] 08/12/2018, 2:25 PM

## 2018-08-12 NOTE — Progress Notes (Signed)
Spoke with patient's wife over the phone about his current status. Wife received updates about how his day went.

## 2018-08-13 ENCOUNTER — Encounter (HOSPITAL_COMMUNITY): Payer: Self-pay

## 2018-08-13 LAB — CBC
HCT: 30.4 % — ABNORMAL LOW (ref 39.0–52.0)
Hemoglobin: 10 g/dL — ABNORMAL LOW (ref 13.0–17.0)
MCH: 30.1 pg (ref 26.0–34.0)
MCHC: 32.9 g/dL (ref 30.0–36.0)
MCV: 91.6 fL (ref 80.0–100.0)
Platelets: 235 10*3/uL (ref 150–400)
RBC: 3.32 MIL/uL — ABNORMAL LOW (ref 4.22–5.81)
RDW: 13.4 % (ref 11.5–15.5)
WBC: 8.3 10*3/uL (ref 4.0–10.5)
nRBC: 0 % (ref 0.0–0.2)

## 2018-08-13 LAB — PROTIME-INR
INR: 4.9 (ref 0.8–1.2)
Prothrombin Time: 44.7 seconds — ABNORMAL HIGH (ref 11.4–15.2)

## 2018-08-13 MED ORDER — DOXYCYCLINE HYCLATE 100 MG PO TABS
100.0000 mg | ORAL_TABLET | Freq: Two times a day (BID) | ORAL | Status: DC
  Administered 2018-08-13 – 2018-08-14 (×3): 100 mg via ORAL
  Filled 2018-08-13 (×3): qty 1

## 2018-08-13 NOTE — Plan of Care (Signed)
  Problem: Clinical Measurements: Goal: Ability to maintain clinical measurements within normal limits will improve Outcome: Progressing   

## 2018-08-13 NOTE — Progress Notes (Signed)
Patient alert and oriented x 4, stable throughout shift with no distress noted.  Wife called around 0100, status update provided.  Will continue to monitor.

## 2018-08-13 NOTE — Progress Notes (Addendum)
PROGRESS NOTE    [name]  LOV:[id number] DOB: Mar 12, 1933 DOA: 08/10/2018 PCP: [name], MD   Brief Narrative:  [name] is a 85 y.o. male with medical history significant of CADs/p CABG in 1979 with redo 1991, CHF EF 20%, permanent atrial fibrillation on Coumadin, CVA, dysphagia, prostate cancers/pradiation, BPH, arthritis, and hyponatremia;who presented to ED again with complaints of shortness of breath.  Patient had just recently been hospitalized from 5/26-5/29, for acute respiratory failure with hypoxia secondary to aspiration pneumonia.  It appears during hospitalization it was recommended for patient go to skilled nursing facility, but ultimately patient was discharged back home with his wife and advance home care services.  For the aspiration pneumonia he was sent home with a 4-day course of clindamycin.  He took a dose this morning.  Family notes that he did have difficulty swallowing the clindamycin capsules.  At home family noted that this morning he was struggling to breathe.  O2 saturations were noted to drop into the 70s for which EMS was called.  At baseline patient is not very mobile. -Patient was admitted with acute on chronic hypoxemic respiratory failure secondary to recurrent aspiration pneumonia and has been started on IV clindamycin, palliative medicine was consulted with plans to go home on hospice on 6/3.  -Transferred to my care today 6/2  Assessment & Plan:   Acute on chronic respiratory failure with hypoxia secondary to recurrent aspiration pneumonia:  -Readmitted to the hospital with worsening hypoxemia after discharge home on 5/29 on clindamycin for aspiration pneumonia -He was hypoxic with sats in the 70s on room air initially, chest x-ray noted increased airspace disease in the left base -Longstanding history of dysphagia since prior stroke -SLP evaluation and MBS completed,  High aspiration risk, he was started on dysphagia 1 diet, pured foods  -Currently on IV clindamycin will transition to oral DOxy today, multiple allergies noted -Wean O2 and check ambulatory O2 sats -Seen by palliative medicine 5/31, yesterday family decided to take him home with home hospice services, with advanced age, recurrent aspiration, severe cardiomyopathy EF of 20%, extensive CAD, frailty remains at risk of recurrent aspiration and cardiopulmonary decline  COPD:  -Mild upper airway wheeze noted, no expiratory wheezes in lower lung fields -Continue Symbicort/pharmacy substitution -Albuterol nebs 4 times daily  History of CVA, dysphagia:Chronic. -Post SLP evaluation, MBS completed, felt to be severe aspiration risk -Continue dysphagia 1(pure)diet, meds in applesauce with thin liquids  -continue aspirin  GERD -Continue Pepcid  Chronicatrial fibrillationonchronic anticoagulation: -Currently in atrial fibrillation, heart rate reasonably controlled, on Coumadin with supratherapeutic INR greater than 4 today  -Continue Coreg and mexiletine  -Hold Coumadin dose today, per pharmacy   Chronic systolicCHF:History of ventricular tachyarrhythmias/pAICD, EF noted to be 20 to 25% with diffuse hypokinesis in 12/2017. He is on coreg and mexiletine at home which are continued -appears close to euvolemic, continue Lasix as needed  Hyponatremia: Chronic.  -Chronic, secondary to severe CHF -Appears euvolemic, poor prognostic sign  Hypothyroidism:Stable. Last TSH 3.768 on 5/12. -Continue levothyroxine  Debility with presumed dementia/FTT/frequent hospitalizations:  During last admission it appears son felt that the patient's wife was unable to care for him at home. PT/OT evaluated the patient and recommended skilled nursing facility placement, but wife ultimately decided against this. -Care management consultfor needs of hospital bed, wheelchair, and oxygen -palliative care consulted and family members agree to hospice on DC by 6/3  when home equipment has been set up for hospice care.  Oral thrush,  topical nystatin   DVT prophylaxis: Coumadin Code Status: DNR Family Communication: Discussed with patient, called and left message for wife Disposition Plan: Arrangements being made for home with hospice tomorrow    Consultants:   Palliative Care  Procedures:   None  Antimicrobials:  Anti-infectives (From admission, onward)   Start     Dose/Rate Route Frequency Ordered Stop   08/10/18 1700  clindamycin (CLEOCIN) IVPB 300 mg     300 mg 100 mL/hr over 30 Minutes Intravenous Every 8 hours 08/10/18 1639     08/10/18 1645  clindamycin (CLEOCIN) capsule 300 mg  Status:  Discontinued     300 mg Oral 3 times daily 08/10/18 1632 08/10/18 1638       Subjective: -Reports that his breathing is better overall, tolerating dysphagia 1 diet  Objective: Vitals:   08/12/18 2020 08/13/18 0002 08/13/18 0737 08/13/18 0809  BP:  105/60 95/73   Pulse:  79 (!) 40   Resp:      Temp:  97.6 F (36.4 C) 97.8 F (36.6 C)   TempSrc:  Oral    SpO2: 97% 98% 98% 95%  Weight:      Height:        Intake/Output Summary (Last 24 hours) at 08/13/2018 1209 Last data filed at 08/13/2018 0438 Gross per 24 hour  Intake 3 ml  Output 200 ml  Net -197 ml   Filed Weights   08/10/18 1848  Weight: 74.6 kg    Examination:  Gen: Frail, chronically ill-appearing male awake, Alert, Oriented X 2, no distress HEENT: PERRLA, Neck supple, no JVD Lungs: Bronchial breath sounds in the right, decreased breath sounds at both bases CVS: Y8-X4/GYJEHUDJS, systolic ejection murmur  Abd: soft, Non tender, non distended, BS present Extremities: No edema Skin: no new rashes Psychiatry: Mood & affect appropriate.     Data Reviewed: I have personally reviewed following labs and imaging studies  CBC: Recent Labs  Lab 08/07/18 0426 08/08/18 0420 08/10/18 1323 08/11/18 1136 08/13/18 0441  WBC 13.5* 13.0* 10.8* 10.0 8.3  NEUTROABS  --   12.2*  --  8.2*  --   HGB 10.7* 10.7* 11.8* 10.7* 10.0*  HCT 31.8* 31.2* 35.8* 32.2* 30.4*  MCV 90.6 88.6 93.0 91.5 91.6  PLT 217 229 231 222 970   Basic Metabolic Panel: Recent Labs  Lab 08/08/18 0420 08/09/18 0623 08/10/18 1323 08/11/18 1136 08/12/18 0622  NA 124* 127* 127* 128* 126*  K 4.6 4.4 4.4 4.1 4.0  CL 93* 88* 90* 94* 91*  CO2 22 25 24 24 25   GLUCOSE 102* 98 86 108* 103*  BUN 20 17 16 15 14   CREATININE 0.95 0.91 1.04 0.81 0.88  CALCIUM 8.6* 8.9 8.6* 8.2* 8.1*  MG  --   --   --  2.1  --    GFR: Estimated Creatinine Clearance: 65.9 mL/min (by C-G formula based on SCr of 0.88 mg/dL). Liver Function Tests: No results for input(s): AST, ALT, ALKPHOS, BILITOT, PROT, ALBUMIN in the last 168 hours. No results for input(s): LIPASE, AMYLASE in the last 168 hours. No results for input(s): AMMONIA in the last 168 hours. Coagulation Profile: Recent Labs  Lab 08/09/18 0623 08/10/18 1328 08/11/18 0541 08/12/18 0622 08/13/18 0441  INR 2.5* 2.6* 2.9* 5.1* 4.9*   Cardiac Enzymes: No results for input(s): CKTOTAL, CKMB, CKMBINDEX, TROPONINI in the last 168 hours. BNP (last 3 results) No results for input(s): PROBNP in the last 8760 hours. HbA1C: No results for input(s): HGBA1C in  the last 72 hours. CBG: No results for input(s): GLUCAP in the last 168 hours. Lipid Profile: No results for input(s): CHOL, HDL, LDLCALC, TRIG, CHOLHDL, LDLDIRECT in the last 72 hours. Thyroid Function Tests: No results for input(s): TSH, T4TOTAL, FREET4, T3FREE, THYROIDAB in the last 72 hours. Anemia Panel: No results for input(s): VITAMINB12, FOLATE, FERRITIN, TIBC, IRON, RETICCTPCT in the last 72 hours. Sepsis Labs: No results for input(s): PROCALCITON, LATICACIDVEN in the last 168 hours.  Recent Results (from the past 240 hour(s))  SARS Coronavirus 2 (CEPHEID- Performed in [place] hospital lab), Hosp Order     Status: None   Collection Time: 08/06/18 10:41 AM  Result Value Ref  Range Status   SARS Coronavirus 2 NEGATIVE NEGATIVE Final    Comment: (NOTE) If result is NEGATIVE SARS-CoV-2 target nucleic acids are NOT DETECTED. The SARS-CoV-2 RNA is generally detectable in upper and lower  respiratory specimens during the acute phase of infection. The lowest  concentration of SARS-CoV-2 viral copies this assay can detect is 250  copies / mL. A negative result does not preclude SARS-CoV-2 infection  and should not be used as the sole basis for treatment or other  patient management decisions.  A negative result may occur with  improper specimen collection / handling, submission of specimen other  than nasopharyngeal swab, presence of viral mutation(s) within the  areas targeted by this assay, and inadequate number of viral copies  (<250 copies / mL). A negative result must be combined with clinical  observations, patient history, and epidemiological information. If result is POSITIVE SARS-CoV-2 target nucleic acids are DETECTED. The SARS-CoV-2 RNA is generally detectable in upper and lower  respiratory specimens dur ing the acute phase of infection.  Positive  results are indicative of active infection with SARS-CoV-2.  Clinical  correlation with patient history and other diagnostic information is  necessary to determine patient infection status.  Positive results do  not rule out bacterial infection or co-infection with other viruses. If result is PRESUMPTIVE POSTIVE SARS-CoV-2 nucleic acids MAY BE PRESENT.   A presumptive positive result was obtained on the submitted specimen  and confirmed on repeat testing.  While 2019 novel coronavirus  (SARS-CoV-2) nucleic acids may be present in the submitted sample  additional confirmatory testing may be necessary for epidemiological  and / or clinical management purposes  to differentiate between  SARS-CoV-2 and other Sarbecovirus currently known to infect humans.  If clinically indicated additional testing with an  alternate test  methodology [phone]) is advised. The SARS-CoV-2 RNA is generally  detectable in upper and lower respiratory sp ecimens during the acute  phase of infection. The expected result is Negative. Fact Sheet for Patients:  StrictlyIdeas.no Fact Sheet for Healthcare Providers: BankingDealers.co.za This test is not yet approved or cleared by the [place] FDA and has been authorized for detection and/or diagnosis of SARS-CoV-2 by FDA under an Emergency Use Authorization (EUA).  This EUA will remain in effect (meaning this test can be used) for the duration of the COVID-19 declaration under Section 564(b)(1) of the Act, 21 U.S.C. section 360bbb-3(b)(1), unless the authorization is terminated or revoked sooner. Performed at [facility], [place] [address] [place], [place] [address]   SARS Coronavirus 2 (CEPHEID - Performed in [place] hospital lab), Hosp Order     Status: None   Collection Time: 08/10/18  4:11 PM  Result Value Ref Range Status   SARS Coronavirus 2 NEGATIVE NEGATIVE Final    Comment: (NOTE)  If result is NEGATIVE SARS-CoV-2 target nucleic acids are NOT DETECTED. The SARS-CoV-2 RNA is generally detectable in upper and lower  respiratory specimens during the acute phase of infection. The lowest  concentration of SARS-CoV-2 viral copies this assay can detect is 250  copies / mL. A negative result does not preclude SARS-CoV-2 infection  and should not be used as the sole basis for treatment or other  patient management decisions.  A negative result may occur with  improper specimen collection / handling, submission of specimen other  than nasopharyngeal swab, presence of viral mutation(s) within the  areas targeted by this assay, and inadequate number of viral copies  (<250 copies / mL). A negative result must be combined with clinical  observations, patient history, and epidemiological information. If  result is POSITIVE SARS-CoV-2 target nucleic acids are DETECTED. The SARS-CoV-2 RNA is generally detectable in upper and lower  respiratory specimens dur ing the acute phase of infection.  Positive  results are indicative of active infection with SARS-CoV-2.  Clinical  correlation with patient history and other diagnostic information is  necessary to determine patient infection status.  Positive results do  not rule out bacterial infection or co-infection with other viruses. If result is PRESUMPTIVE POSTIVE SARS-CoV-2 nucleic acids MAY BE PRESENT.   A presumptive positive result was obtained on the submitted specimen  and confirmed on repeat testing.  While 2019 novel coronavirus  (SARS-CoV-2) nucleic acids may be present in the submitted sample  additional confirmatory testing may be necessary for epidemiological  and / or clinical management purposes  to differentiate between  SARS-CoV-2 and other Sarbecovirus currently known to infect humans.  If clinically indicated additional testing with an alternate test  methodology [phone]) is advised. The SARS-CoV-2 RNA is generally  detectable in upper and lower respiratory sp ecimens during the acute  phase of infection. The expected result is Negative. Fact Sheet for Patients:  StrictlyIdeas.no Fact Sheet for Healthcare Providers: BankingDealers.co.za This test is not yet approved or cleared by the [place] FDA and has been authorized for detection and/or diagnosis of SARS-CoV-2 by FDA under an Emergency Use Authorization (EUA).  This EUA will remain in effect (meaning this test can be used) for the duration of the COVID-19 declaration under Section 564(b)(1) of the Act, 21 U.S.C. section 360bbb-3(b)(1), unless the authorization is terminated or revoked sooner. Performed at [facility], [place] [address] [place], [place] [address]          Radiology Studies: No results  found.      Scheduled Meds: . albuterol  2.5 mg Nebulization TID  . alfuzosin  10 mg Oral QHS  . aspirin EC  81 mg Oral Daily  . carvedilol  6.25 mg Oral BID WC  . famotidine  40 mg Oral Daily  . ipratropium  2 spray Each Nare BID  . levothyroxine  125 mcg Oral QAC breakfast  . linaclotide  145 mcg Oral QAC breakfast  . mexiletine  150 mg Oral BID  . mometasone-formoterol  2 puff Inhalation BID  . nystatin  5 mL Oral QID  . saccharomyces boulardii  250 mg Oral Daily  . sodium chloride flush  3 mL Intravenous Q12H  . Warfarin - Pharmacist Dosing Inpatient   Does not apply q1800   Continuous Infusions: . clindamycin (CLEOCIN) IV 300 mg (08/13/18 0019)     LOS: 2 days    Time spent: 35 minutes    Domenic Polite, MD  [facility]  08/13/2018, 12:09 PM

## 2018-08-13 NOTE — Progress Notes (Signed)
[name] for warfarin Indication: atrial fibrillation, hx of CVA  Vital Signs: Temp: 97.8 F (36.6 C) (06/02 0737) BP: 95/73 (06/02 0737) Pulse Rate: 40 (06/02 0737)  Labs: Recent Labs    08/10/18 1323  08/11/18 0541 08/11/18 1136 08/12/18 0622 08/13/18 0441  HGB 11.8*  --   --  10.7*  --  10.0*  HCT 35.8*  --   --  32.2*  --  30.4*  PLT 231  --   --  222  --  235  LABPROT  --    < > 30.0*  --  46.1* 44.7*  INR  --    < > 2.9*  --  5.1* 4.9*  CREATININE 1.04  --   --  0.81 0.88  --    < > = values in this interval not displayed.   Estimated Creatinine Clearance: 65.9 mL/min (by C-G formula based on SCr of 0.88 mg/dL).  Medical History: Past Medical History:  Diagnosis Date  . Aortic stenosis, severe    WITH PERCUTANEOUS AORTIC VALVE (TAVI) IN March 2012 at the [facility]  . Aspiration into airway   . Cervical myelopathy ([place])   . Chronic anticoagulation    on coumadin  . Chronic systolic CHF (congestive heart failure) ([place])   . Coronary artery disease    a.  s/p CABG 1979 and redo 1991.  Marland Kitchen Coughing   . Diverticulitis    CURRENTLY CONTROLLED WITH NO EVIDENCE OF RECURRENT INFECTION  . Esophageal stricture    WITH DILATATION  . Frailty   . GERD (gastroesophageal reflux disease)   . Heart murmur   . Hyperlipidemia   . Hyponatremia   . Ischemic cardiomyopathy 05/2010   Has EF of 25%  . MI (myocardial infarction) ([place]) 1974, 1978  . Osteoarthritis    RIGHT KNEE  . Osteoporosis   . Permanent atrial fibrillation    Has not tolerated amiodarone in the past. Amiodarone was stopped in September of 2010 due to side effects.  . Presence of cardioverter defibrillator   . Prostate cancer ([place])   . Pulmonary embolism ([place])   . Stroke [facility])    3 strokes  . VT (ventricular tachycardia) ([employer])     Assessment: 63 y/oM with recent admission 5/26-5/29 for acute respiratory failure with hypoxia secondary to aspiration pneumonia  re-admitted on 5/30 with SOB. He is on chronic warfarin for history of a-fib, CVA. Pharmacy asked to assist with warfarin dosing while patient admitted.   PTA dose: 5mg  daily except 7.5mg  on Fridays  Today, 08/13/18:  INR 4.9, remains SUPRAtherapeutic but trending down  CBC: Hgb 10, Pltc WNL   No bleeding issues noted per nursing  DDI: antibiotics (Clindamycin switched to Doxycycline today); also on concurrent aspirin as PTA  Goal of Therapy:  INR 2.5-3 Monitor platelets by anticoagulation protocol: Yes   Plan:  No warfarin today Daily PT/INR CBC in AM Monitor closely for s/sx of bleeding   Lindell Spar, PharmD, BCPS Clinical Pharmacist  [phone] 08/13/2018,12:36 PM

## 2018-08-14 ENCOUNTER — Telehealth: Payer: Self-pay | Admitting: Internal Medicine

## 2018-08-14 ENCOUNTER — Encounter: Payer: Self-pay | Admitting: Internal Medicine

## 2018-08-14 DIAGNOSIS — J9621 Acute and chronic respiratory failure with hypoxia: Secondary | ICD-10-CM

## 2018-08-14 LAB — PROTIME-INR
INR: 3.4 — ABNORMAL HIGH (ref 0.8–1.2)
Prothrombin Time: 33.7 seconds — ABNORMAL HIGH (ref 11.4–15.2)

## 2018-08-14 LAB — CBC
HCT: 32.8 % — ABNORMAL LOW (ref 39.0–52.0)
Hemoglobin: 10.9 g/dL — ABNORMAL LOW (ref 13.0–17.0)
MCH: 30.1 pg (ref 26.0–34.0)
MCHC: 33.2 g/dL (ref 30.0–36.0)
MCV: 90.6 fL (ref 80.0–100.0)
Platelets: 261 10*3/uL (ref 150–400)
RBC: 3.62 MIL/uL — ABNORMAL LOW (ref 4.22–5.81)
RDW: 13.4 % (ref 11.5–15.5)
WBC: 8.3 10*3/uL (ref 4.0–10.5)
nRBC: 0 % (ref 0.0–0.2)

## 2018-08-14 MED ORDER — DOXYCYCLINE HYCLATE 100 MG PO TABS: 100.0000 mg | ORAL_TABLET | Freq: Two times a day (BID) | ORAL | 0 refills | Status: AC

## 2018-08-14 MED ORDER — ALBUTEROL SULFATE HFA 108 (90 BASE) MCG/ACT IN AERS: 2.0000 | INHALATION_SPRAY | RESPIRATORY_TRACT | 0 refills | Status: AC | PRN

## 2018-08-14 MED ORDER — NYSTATIN 100000 UNIT/ML MT SUSP: 5.0000 mL | Freq: Four times a day (QID) | OROMUCOSAL | 0 refills | Status: AC

## 2018-08-14 MED ORDER — CYCLOSPORINE 0.05 % OP EMUL: 1.0000 [drp] | Freq: Two times a day (BID) | OPHTHALMIC | 0 refills | Status: AC | PRN

## 2018-08-14 NOTE — Telephone Encounter (Signed)
Spoke with pt's wife and daughter, [name]. Pt's wife reports she is unable to understand me over the phone and asked that I speak with [name]. [name] reports that pt is "hanging in there" right now but that the pt and family discussed goals of care with hospice and decided that ICD therapies should be turned off.   Discussed with Dr. Caryl Comes, who will go to pt's house to deactivate ICD tonight. [name] is aware and agreeable to this plan.

## 2018-08-14 NOTE — Progress Notes (Signed)
Called by hospice  My Erck with aspiration pneumonia  His death is approaching and have requested ICD inactivation I will go and do it  Have called and spoken with the family who anticipates my coming

## 2018-08-14 NOTE — Consult Note (Signed)
   [facility] CM Inpatient Consult   08/14/2018  [name] January 30, 1933 [id number]   Patient screened for extreme high risk score for unplanned readmission score and less than 7 day re-hospitalizations to assess for barriers to care and needs in the Medicare [facility]. Chart reviewed and found that the patient is being referred to home with hospice care.  Plan:  Home with hospice care through [facility] per chart review of inpatient [facility] RNCM and Palliative consults notes.   Given this choice patient will have full case management services through Hospice and we will sign off.  [name], RN BSN [place] [facility] Liaison  [phone] business mobile phone Toll free office [phone]  Fax number: [phone] [email or web address]

## 2018-08-14 NOTE — Telephone Encounter (Signed)
Wife is calling because she want to let Dr Caryl Comes know the patient is not doing very well, he has aspiration pneumonia. In the event he passes, she would like to know what to do about his defib because they don't want it to keep running so how can they cut it off in the situation.

## 2018-08-14 NOTE — Discharge Summary (Signed)
Physician Discharge Summary  [name] WTU:[id number] DOB: 1932/11/23 DOA: 08/10/2018  PCP: [name], MD  Admit date: 08/10/2018 Discharge date: 08/14/2018  Admitted From: Home Disposition: Home with hospice  Recommendations for Outpatient Follow-up:  1. Follow up with PCP in 1 week  Outpatient follow-up with home hospice at earliest convenience.   Home Health: No Equipment/Devices: None  Discharge Condition: Poor CODE STATUS: DNR Diet recommendation: As per SLP recommendations  Brief/Interim Summary: 85 year old male with history of CAD status post CABG in 1979 with redo in 1991, CHF with EF of 20%, permanent atrial fibrillation on Coumadin, CVA, dysphagia, prostate cancer status post radiation, BPH, arthritis and hyponatremia presented on 08/10/2018 with worsening shortness of breath.  Patient was recently hospitalized from 08/06/2018-08/09/2018 for acute respiratory failure with hypoxia secondary to aspiration pneumonia and was discharged home on 4-day course of clindamycin.  At the time PT had recommended SNF placement.  Patient was admitted again with acute on chronic hypoxic respiratory failure and was started on IV clindamycin for aspiration pneumonia.  Palliative care was consulted.  After discussion with the family, it was decided that patient will be discharged home with hospice.  Overall prognosis is very poor.  He will be discharged home today with home hospice.  Discharge Diagnoses:  Principal Problem:   Acute respiratory failure with hypoxia (HCC) Active Problems:   Osteoarthritis   Atrial fibrillation, chronic   Anticoagulated on Coumadin   Automatic implantable cardioverter-defibrillator in situ   Goals of care, counseling/discussion   Hypothyroidism   Chronic systolic CHF (congestive heart failure) (HCC)   Recurrent aspiration pneumonia (HCC)   Dysphagia   Leukocytosis   Palliative care by specialist   FTT (failure to thrive) in adult  Acute on chronic hypoxic  respiratory failure Recurrent aspiration pneumonia Dysphagia Debility with presumed dementia/failure to thrive/frequent hospitalizations -Readmitted to the hospital with worsening hypoxemia after discharge home on 529 on clindamycin for aspiration pneumonia -He was hypoxic with saturations in the 70s on room air initially, chest x-ray noted increased airspace disease in the left base -Started on IV clindamycin which was switched to oral doxycycline on 08/13/2018. -SLP evaluation and MBS completed, high aspiration risk.  Started on dysphagia 1 diet, pured foods.  Continue diet as per SLP recommendations -Continue doxycycline for 3 more days upon discharge -After palliative care discussion with family, it was decided that patient will be discharged home with hospice because of advanced age, recurrent aspiration, severe cardiomyopathy with EF of 20%, extensive CAD and frailty.  She will be discharged home today with home hospice  COPD -Continue home regimen.  Chronic atrial fibrillation on chronic anticoagulation -Continue Coreg and mexiletine.  Continue Coumadin. -If condition worsens at home, recommend discontinuing all medications  Chronic systolic heart failure status post AICD -EF noted to be 20 to 25% with diffuse hypokinesis in 12/2017 -Continue Coreg.  Continue Lasix as needed  Chronic hyponatremia -Outpatient follow-up  Hypothyroidism -Continue levothyroxine  Oral thrush -Continue nystatin    Discharge Instructions  Discharge Instructions    Diet - low sodium heart healthy   Complete by:  As directed    Increase activity slowly   Complete by:  As directed      Allergies as of 08/14/2018      Reactions   Penicillins Shortness Of Breath, Rash   Has patient had a PCN reaction causing immediate rash, facial/tongue/throat swelling, SOB or lightheadedness with hypotension: Yes Has patient had a PCN reaction causing severe rash involving mucus membranes or  skin necrosis:  No Has patient had a PCN reaction that required hospitalization No Has patient had a PCN reaction occurring within the last 10 years: No If all of the above answers are "NO", then may proceed with Cephalosporin use.   Tizanidine Shortness Of Breath   Light headed   Ace Inhibitors    Has not tolerated in the past due to hyperkalemia   Antihistamines, Diphenhydramine-type Other (See Comments)   Inhibits urination   Clemastine Fumarate Other (See Comments)   Inhibits urination   Dimenhydrinate Other (See Comments)   Inhibits urination   Amiodarone Other (See Comments)   Unknown reaction      Medication List    STOP taking these medications   clindamycin 300 MG capsule Commonly known as:  CLEOCIN     TAKE these medications   albuterol 108 (90 Base) MCG/ACT inhaler Commonly known as:  VENTOLIN HFA Inhale 2 puffs into the lungs every 4 (four) hours as needed for wheezing or shortness of breath.   alfuzosin 10 MG 24 hr tablet Commonly known as:  UROXATRAL Take 10 mg by mouth at bedtime.   aspirin EC 81 MG tablet Take 81 mg by mouth daily.   benzonatate 200 MG capsule Commonly known as:  TESSALON Take 200 mg by mouth 3 (three) times daily as needed for cough.   budesonide-formoterol 160-4.5 MCG/ACT inhaler Commonly known as:  SYMBICORT Inhale 2 puffs into the lungs 2 (two) times daily.   carvedilol 6.25 MG tablet Commonly known as:  COREG TAKE 1 TABLET BY MOUTH TWICE DAILY WITH A MEAL. What changed:  See the new instructions.   cholecalciferol 1000 units tablet Commonly known as:  VITAMIN D Take 1,000 Units by mouth 2 (two) times daily.   cycloSPORINE 0.05 % ophthalmic emulsion Commonly known as:  RESTASIS Place 1 drop into both eyes 2 (two) times daily as needed (dry eyes).   cycloSPORINE 0.05 % ophthalmic emulsion Commonly known as:  Restasis Place 1 drop into both eyes 2 (two) times daily as needed (dry eyes).   doxycycline 100 MG tablet Commonly known as:   VIBRA-TABS Take 1 tablet (100 mg total) by mouth every 12 (twelve) hours for 3 days.   famotidine 40 MG tablet Commonly known as:  PEPCID Take 40 mg by mouth daily.   feeding supplement (ENSURE ENLIVE) Liqd Take 237 mLs by mouth 2 (two) times daily between meals.   furosemide 20 MG tablet Commonly known as:  LASIX TAKE (1) TABLET DAILY AS NEEDED FOR SWELLING. What changed:  See the new instructions.   ipratropium 0.03 % nasal spray Commonly known as:  ATROVENT Place 2 sprays into both nostrils 2 (two) times daily.   levothyroxine 125 MCG tablet Commonly known as:  Synthroid Take 1 tablet (125 mcg total) by mouth daily before breakfast.   lidocaine 5 % Commonly known as:  LIDODERM Place 1 patch onto the skin daily as needed (pain). Remove & Discard patch within 12 hours or as directed by MD   Linzess 145 MCG Caps capsule Generic drug:  linaclotide Take 145 mcg by mouth daily before breakfast.   mexiletine 150 MG capsule Commonly known as:  MEXITIL Take 1 capsule (150 mg total) by mouth 2 (two) times a day.   nitroGLYCERIN 0.4 MG SL tablet Commonly known as:  Nitrostat Place 1 tablet (0.4 mg total) under the tongue every 5 (five) minutes x 3 doses as needed for chest pain (Max 3 doses in 15 min. Call 911).  nystatin 100000 UNIT/ML suspension Commonly known as:  MYCOSTATIN Take 5 mLs (500,000 Units total) by mouth 4 (four) times daily.   omeprazole 20 MG capsule Commonly known as:  PRILOSEC Take 20 mg by mouth daily as needed (acid reflux).   polyethylene glycol 17 g packet Commonly known as:  MIRALAX / GLYCOLAX Take 17 g by mouth daily as needed for mild constipation.   PROBIOTIC DAILY PO Take 1 capsule by mouth daily.   traMADol 50 MG tablet Commonly known as:  ULTRAM Take 50 mg by mouth every 6 (six) hours as needed for moderate pain.   warfarin 5 MG tablet Commonly known as:  Coumadin Take as directed. If you are unsure how to take this medication, talk  to your nurse or doctor. Original instructions:  Take 1-1.5 tablets (5-7.5 mg total) by mouth See admin instructions. Take 7.5 mg by mouth only on Fridays; all other days take 5mg  What changed:  additional instructions      Follow-up Information    [facility] Follow up.   Specialty:  Hospice and Palliative Medicine Why:  RN Contact information: [place] [address] [phone]        [name], MD. Schedule an appointment as soon as possible for a visit in 1 week(s).   Specialty:  Internal Medicine Contact information: [place] [address] [phone]        Sherren Mocha, MD .   Specialty:  Cardiology Contact information: [phone] [address] [place] [address] [phone]        Deboraha Sprang, MD .   Specialty:  Cardiology Contact information: [phone] [address] [place] [address] [phone]          Allergies  Allergen Reactions  . Penicillins Shortness Of Breath and Rash    Has patient had a PCN reaction causing immediate rash, facial/tongue/throat swelling, SOB or lightheadedness with hypotension: Yes Has patient had a PCN reaction causing severe rash involving mucus membranes or skin necrosis: No Has patient had a PCN reaction that required hospitalization No Has patient had a PCN reaction occurring within the last 10 years: No If all of the above answers are "NO", then may proceed with Cephalosporin use.   . Tizanidine Shortness Of Breath    Light headed  . Ace Inhibitors     Has not tolerated in the past due to hyperkalemia  . Antihistamines, Diphenhydramine-Type Other (See Comments)    Inhibits urination  . Clemastine Fumarate Other (See Comments)    Inhibits urination  . Dimenhydrinate Other (See Comments)    Inhibits urination  . Amiodarone Other (See Comments)    Unknown reaction    Consultations:  Palliative care   Procedures/Studies: Ct  Head Wo Contrast  Result Date: 07/23/2018 CLINICAL DATA:  85 year old male with a history of altered mental status EXAM: CT HEAD WITHOUT CONTRAST TECHNIQUE: Contiguous axial images were obtained from the base of the skull through the vertex without intravenous contrast. COMPARISON:  CT 02/07/2016 FINDINGS: Brain: No acute intracranial hemorrhage. No midline shift or mass effect. Gray-white differentiation maintained. Confluent hypodensity in the bilateral periventricular white matter. Focal hypodensity in the left centrum semiovale, unchanged. Mild senescent volume loss. Unremarkable appearance of the ventricular system. Vascular: Intracranial atherosclerosis. Skull: No acute fracture.  No aggressive bone lesion identified. Sinuses/Orbits: Unremarkable appearance of the orbits. Mastoid air cells clear. No middle ear effusion. No significant sinus disease. Other: None IMPRESSION: Negative for acute  intracranial abnormality. Evidence of chronic microvascular ischemic disease. Associated intracranial atherosclerosis. Electronically Signed   By: Corrie Mckusick D.O.   On: 07/23/2018 08:30   Dg Chest Portable 1 View  Result Date: 08/10/2018 CLINICAL DATA:  Short of breath EXAM: PORTABLE CHEST 1 VIEW COMPARISON:  08/06/2018 FINDINGS: Left subclavian AICD device is stable. Aortic valvuloplasty hardware remains in place. Airspace disease throughout the right lung is not significantly changed. Patchy airspace disease at the left base is increased. No pneumothorax. Normal heart size. IMPRESSION: Airspace disease throughout the right lung is not significantly changed. Increased airspace disease at the left lung base. Electronically Signed   By: Marybelle Killings M.D.   On: 08/10/2018 13:53   Dg Chest Portable 1 View  Result Date: 08/06/2018 CLINICAL DATA:  Shortness of breath. EXAM: PORTABLE CHEST 1 VIEW COMPARISON:  Radiograph of Jul 22, 2018. FINDINGS: Stable cardiomediastinal silhouette. Sternotomy wires are noted.  Left-sided pacemaker is unchanged in position. No pneumothorax is noted. Left lung is clear. New large airspace opacity is noted in the right lung concerning for pneumonia. Minimal right pleural effusion may be present. Bony thorax is unremarkable. IMPRESSION: Interval placement of large airspace opacity in right lung concerning for pneumonia. Followup PA and lateral chest X-ray is recommended in 3-4 weeks following trial of antibiotic therapy to ensure resolution and exclude underlying malignancy. Electronically Signed   By: Marijo Conception M.D.   On: 08/06/2018 10:39   Dg Chest Portable 1 View  Result Date: 07/22/2018 CLINICAL DATA:  Shortness of breath today. History of cardiomyopathy. EXAM: PORTABLE CHEST 1 VIEW COMPARISON:  Single-view of the chest 07/06/2018. PA and lateral chest 09/01/2015. CT chest 07/11/2015. FINDINGS: There is cardiomegaly. The patient is status post aortic valve replacement and has an AICD in place. Atherosclerosis is noted. No pneumothorax or pleural effusion. Pulmonary vascular congestion is noted. No consolidative process is seen. No acute bony abnormality. IMPRESSION: Cardiomegaly and pulmonary vascular congestion. Electronically Signed   By: Inge Rise M.D.   On: 07/22/2018 17:28   Dg Swallowing Func-speech Pathology  Result Date: 08/07/2018 Objective Swallowing Evaluation: Type of Study: MBS-Modified Barium Swallow Study  Patient Details Name: HARMON BOMMARITO MRN: [id number] Date of Birth: [birth year] Today's Date: 08/07/2018 Time: SLP Start Time (ACUTE ONLY): 1330 -SLP Stop Time (ACUTE ONLY): 1355 SLP Time Calculation (min) (ACUTE ONLY): 25 min Past Medical History: Past Medical History: Diagnosis Date . Aortic stenosis, severe   WITH PERCUTANEOUS AORTIC VALVE (TAVI) IN March 2012 at the [facility] . Aspiration into airway  . Cervical myelopathy ([place])  . Chronic anticoagulation   on coumadin . Chronic systolic CHF (congestive heart failure) ([place])  . Coronary artery  disease   a.  s/p CABG 1979 and redo 1991. Marland Kitchen Coughing  . Diverticulitis   CURRENTLY CONTROLLED WITH NO EVIDENCE OF RECURRENT INFECTION . Esophageal stricture   WITH DILATATION . Frailty  . GERD (gastroesophageal reflux disease)  . Heart murmur  . Hyperlipidemia  . Hyponatremia  . Ischemic cardiomyopathy 05/2010  Has EF of 25% . MI (myocardial infarction) ([place]) 1974, 1978 . Osteoarthritis   RIGHT KNEE . Osteoporosis  . Permanent atrial fibrillation   Has not tolerated amiodarone in the past. Amiodarone was stopped in September of 2010 due to side effects. . Presence of cardioverter defibrillator  . Prostate cancer ([place])  . Pulmonary embolism ([place])  . Stroke [facility])   3 strokes . VT (ventricular tachycardia) ([place])  Past Surgical History: Past Surgical History: Procedure Laterality Date .  AORTIC VALVE REPLACEMENT    Percutaneous AVR in March 2012 at [facility] . BACK SURGERY   . CARDIAC CATHETERIZATION  2010  SEVERE LV DYSFUNCTION WITH ESTIMATED EJECTION FRACTION OF 25% . CARDIOVERSION  02/16/2011  Procedure: CARDIOVERSION;  Surgeon: Carlena Bjornstad, MD;  Location: [facility];  Service: Cardiovascular;  Laterality: N/A; . CHOLECYSTECTOMY   . CORONARY ARTERY BYPASS GRAFT  1979 . CORONARY ARTERY BYPASS GRAFT  1991  REDO SURGERY . ICD  Feb 2003; 02/2013  gen change 02-11-2013 by Dr Caryl Comes . IMPLANTABLE CARDIOVERTER DEFIBRILLATOR (ICD) GENERATOR CHANGE N/A 02/10/2013  Procedure: ICD GENERATOR CHANGE;  Surgeon: Deboraha Sprang, MD;  Location: [facility] CATH LAB;  Service: Cardiovascular;  Laterality: N/A; . SHOULDER SURGERY   HPI: Patient is an 85 y.o. male with PMH: CAD s/p CABG in 1979, atrial fibrillation, CVA, dysphagia, prostate cancer s/p radiation, BPH and hyponatremia, who presented to hospital after incident at home during which he swallowed a lot of water and started coughing and was SOB. MBS that was completed on 05/09/18 recommended Regular solids, thin liquids with aspiration precautions (chin tuck to clear residuals,  eating solids and liquids separately). CXR revealed large airspace opacity in right lung concerning for PNA.  Subjective: pleasant, alert Assessment / Plan / Recommendation CHL IP CLINICAL IMPRESSIONS 08/07/2018 Clinical Impression Patient exhibits a moderate pharyngeal dysphagia characterized by decreased laryngeal elevation, decreaesed pharyngeal contraction and significantly restricted UES opening, leading to vallecular and pyriform sinus residuals and slow transit of thin liquids and puree solids boluses through UES. Patient had sensed aspiration before the swallow when taking barium tablet with liquids. Barium tablet became briefly lodged at level of vallecular sinus, and then pyriform sinus, before transiting through UES. Patient exhibited silent pentration during the swallow with thin liquids, occuring mainly when vallecular and pyriform sinuses were full. Chin tuck posture did help to clear some of pharyngeal residuals, but patient had to put a lot of effort into performing dry swallows after PO's, and full clearance of pharynx was never achieved. No aspiration of vallecular or pyriform sinus residuals observed during this study, but very likely to occur during regular PO intake. As compared to MBS on 05/09/18, there is not much that has changed other than a slight decrease in UES opening. In addition, 2014 MBS report describes many of the same swallow function deficits that were noted in today's MBS. Plan is to speak with patient and MD regarding swallow safety and determining how to maximize safety but also nutrition and pleasure with PO intake. Aside from patient education, direct intervention is not likely to improve his swallow function, as it has been chronic since 2014.  SLP Visit Diagnosis Dysphagia, pharyngeal phase (R13.13) Attention and concentration deficit following -- Frontal lobe and executive function deficit following -- Impact on safety and function Severe aspiration risk   CHL IP TREATMENT  RECOMMENDATION 08/07/2018 Treatment Recommendations Therapy as outlined in treatment plan below   Prognosis 08/07/2018 Prognosis for Safe Diet Advancement Fair Barriers to Reach Goals Severity of deficits;Time post onset Barriers/Prognosis Comment -- CHL IP DIET RECOMMENDATION 08/07/2018 SLP Diet Recommendations Thin liquid;Dysphagia 1 (Puree) solids Liquid Administration via Cup Medication Administration Crushed with puree Compensations Slow rate;Small sips/bites;Clear throat intermittently Postural Changes Remain semi-upright after after feeds/meals (Comment);Seated upright at 90 degrees   CHL IP OTHER RECOMMENDATIONS 08/07/2018 Recommended Consults -- Oral Care Recommendations Oral care BID Other Recommendations --   CHL IP FOLLOW UP RECOMMENDATIONS 08/07/2018 Follow up Recommendations None   CHL IP  FREQUENCY AND DURATION 08/07/2018 Speech Therapy Frequency (ACUTE ONLY) min 1 x/week Treatment Duration 1 week      CHL IP ORAL PHASE 08/07/2018 Oral Phase Impaired Oral - Pudding Teaspoon -- Oral - Pudding Cup -- Oral - Honey Teaspoon -- Oral - Honey Cup -- Oral - Nectar Teaspoon -- Oral - Nectar Cup -- Oral - Nectar Straw -- Oral - Thin Teaspoon -- Oral - Thin Cup -- Oral - Thin Straw -- Oral - Puree Weak lingual manipulation;Delayed oral transit;Reduced posterior propulsion Oral - Mech Soft -- Oral - Regular -- Oral - Multi-Consistency -- Oral - Pill -- Oral Phase - Comment --  CHL IP PHARYNGEAL PHASE 08/07/2018 Pharyngeal Phase Impaired Pharyngeal- Pudding Teaspoon -- Pharyngeal -- Pharyngeal- Pudding Cup -- Pharyngeal -- Pharyngeal- Honey Teaspoon -- Pharyngeal -- Pharyngeal- Honey Cup -- Pharyngeal -- Pharyngeal- Nectar Teaspoon -- Pharyngeal -- Pharyngeal- Nectar Cup -- Pharyngeal -- Pharyngeal- Nectar Straw -- Pharyngeal -- Pharyngeal- Thin Teaspoon -- Pharyngeal -- Pharyngeal- Thin Cup Delayed swallow initiation-vallecula;Pharyngeal residue - valleculae;Pharyngeal residue - pyriform;Penetration/Aspiration before  swallow;Penetration/Aspiration during swallow;Reduced tongue base retraction;Reduced epiglottic inversion;Reduced pharyngeal peristalsis;Reduced laryngeal elevation;Reduced anterior laryngeal mobility;Compensatory strategies attempted (with notebox) Pharyngeal Material enters airway, remains ABOVE vocal cords and not ejected out;Material enters airway, passes BELOW cords and not ejected out despite cough attempt by patient Pharyngeal- Thin Straw -- Pharyngeal -- Pharyngeal- Puree Delayed swallow initiation-vallecula;Pharyngeal residue - valleculae;Pharyngeal residue - pyriform;Compensatory strategies attempted (with notebox);Reduced pharyngeal peristalsis;Reduced anterior laryngeal mobility;Reduced epiglottic inversion;Reduced laryngeal elevation;Reduced tongue base retraction Pharyngeal -- Pharyngeal- Mechanical Soft -- Pharyngeal -- Pharyngeal- Regular -- Pharyngeal -- Pharyngeal- Multi-consistency -- Pharyngeal -- Pharyngeal- Pill Penetration/Aspiration during swallow;Delayed swallow initiation-pyriform sinuses;Delayed swallow initiation-vallecula;Pharyngeal residue - valleculae;Pharyngeal residue - pyriform Pharyngeal Material enters airway, passes BELOW cords and not ejected out despite cough attempt by patient Pharyngeal Comment --  CHL IP CERVICAL ESOPHAGEAL PHASE 08/07/2018 Cervical Esophageal Phase Impaired Pudding Teaspoon -- Pudding Cup -- Honey Teaspoon -- Honey Cup -- Nectar Teaspoon -- Nectar Cup -- Nectar Straw -- Thin Teaspoon -- Thin Cup Reduced cricopharyngeal relaxation Thin Straw -- Puree Reduced cricopharyngeal relaxation Mechanical Soft -- Regular -- Multi-consistency -- Pill Reduced cricopharyngeal relaxation Cervical Esophageal Comment -- Aadith, Raudenbush 08/07/2018, 4:41 PM  Sonia Baller, MA, CCC-SLP Speech Therapy [facility] Acute [facility] Pager: [phone]                Subjective: Patient seen and examined at bedside.  He is awake.  Poor historian.  No overnight fever, vomiting or  worsening shortness of breath reported.  States that he would want to go home.  Discharge Exam: Vitals:   08/14/18 0809 08/14/18 0814  BP:    Pulse: 82   Resp: 20   Temp:    SpO2: 96% 96%    General: Pt is alert, awake, not in acute distress.  Elderly male lying in bed.  Poor historian. Cardiovascular: rate controlled, S1/S2 + Respiratory: bilateral decreased breath sounds at bases, scattered crackles Abdominal: Soft, NT, ND, bowel sounds + Extremities: Trace edema, no cyanosis    The results of significant diagnostics from this hospitalization (including imaging, microbiology, ancillary and laboratory) are listed below for reference.     Microbiology: Recent Results (from the past 240 hour(s))  SARS Coronavirus 2 (CEPHEID- Performed in [place] hospital lab), Hosp Order     Status: None   Collection Time: 08/06/18 10:41 AM  Result Value Ref Range Status   SARS Coronavirus 2 NEGATIVE NEGATIVE Final    Comment: (NOTE)  If result is NEGATIVE SARS-CoV-2 target nucleic acids are NOT DETECTED. The SARS-CoV-2 RNA is generally detectable in upper and lower  respiratory specimens during the acute phase of infection. The lowest  concentration of SARS-CoV-2 viral copies this assay can detect is 250  copies / mL. A negative result does not preclude SARS-CoV-2 infection  and should not be used as the sole basis for treatment or other  patient management decisions.  A negative result may occur with  improper specimen collection / handling, submission of specimen other  than nasopharyngeal swab, presence of viral mutation(s) within the  areas targeted by this assay, and inadequate number of viral copies  (<250 copies / mL). A negative result must be combined with clinical  observations, patient history, and epidemiological information. If result is POSITIVE SARS-CoV-2 target nucleic acids are DETECTED. The SARS-CoV-2 RNA is generally detectable in upper and lower  respiratory  specimens dur ing the acute phase of infection.  Positive  results are indicative of active infection with SARS-CoV-2.  Clinical  correlation with patient history and other diagnostic information is  necessary to determine patient infection status.  Positive results do  not rule out bacterial infection or co-infection with other viruses. If result is PRESUMPTIVE POSTIVE SARS-CoV-2 nucleic acids MAY BE PRESENT.   A presumptive positive result was obtained on the submitted specimen  and confirmed on repeat testing.  While 2019 novel coronavirus  (SARS-CoV-2) nucleic acids may be present in the submitted sample  additional confirmatory testing may be necessary for epidemiological  and / or clinical management purposes  to differentiate between  SARS-CoV-2 and other Sarbecovirus currently known to infect humans.  If clinically indicated additional testing with an alternate test  methodology [phone]) is advised. The SARS-CoV-2 RNA is generally  detectable in upper and lower respiratory sp ecimens during the acute  phase of infection. The expected result is Negative. Fact Sheet for Patients:  StrictlyIdeas.no Fact Sheet for Healthcare Providers: BankingDealers.co.za This test is not yet approved or cleared by the [place] FDA and has been authorized for detection and/or diagnosis of SARS-CoV-2 by FDA under an Emergency Use Authorization (EUA).  This EUA will remain in effect (meaning this test can be used) for the duration of the COVID-19 declaration under Section 564(b)(1) of the Act, 21 U.S.C. section 360bbb-3(b)(1), unless the authorization is terminated or revoked sooner. Performed at [facility], [place] [address], [place], [place] [address]   SARS Coronavirus 2 (CEPHEID - Performed in [place] hospital lab), Hosp Order     Status: None   Collection Time: 08/10/18  4:11 PM  Result Value Ref Range Status   SARS Coronavirus 2  NEGATIVE NEGATIVE Final    Comment: (NOTE) If result is NEGATIVE SARS-CoV-2 target nucleic acids are NOT DETECTED. The SARS-CoV-2 RNA is generally detectable in upper and lower  respiratory specimens during the acute phase of infection. The lowest  concentration of SARS-CoV-2 viral copies this assay can detect is 250  copies / mL. A negative result does not preclude SARS-CoV-2 infection  and should not be used as the sole basis for treatment or other  patient management decisions.  A negative result may occur with  improper specimen collection / handling, submission of specimen other  than nasopharyngeal swab, presence of viral mutation(s) within the  areas targeted by this assay, and inadequate number of viral copies  (<250 copies / mL). A negative result must be combined with clinical  observations, patient history, and epidemiological  information. If result is POSITIVE SARS-CoV-2 target nucleic acids are DETECTED. The SARS-CoV-2 RNA is generally detectable in upper and lower  respiratory specimens dur ing the acute phase of infection.  Positive  results are indicative of active infection with SARS-CoV-2.  Clinical  correlation with patient history and other diagnostic information is  necessary to determine patient infection status.  Positive results do  not rule out bacterial infection or co-infection with other viruses. If result is PRESUMPTIVE POSTIVE SARS-CoV-2 nucleic acids MAY BE PRESENT.   A presumptive positive result was obtained on the submitted specimen  and confirmed on repeat testing.  While 2019 novel coronavirus  (SARS-CoV-2) nucleic acids may be present in the submitted sample  additional confirmatory testing may be necessary for epidemiological  and / or clinical management purposes  to differentiate between  SARS-CoV-2 and other Sarbecovirus currently known to infect humans.  If clinically indicated additional testing with an alternate test  methodology [phone])  is advised. The SARS-CoV-2 RNA is generally  detectable in upper and lower respiratory sp ecimens during the acute  phase of infection. The expected result is Negative. Fact Sheet for Patients:  StrictlyIdeas.no Fact Sheet for Healthcare Providers: BankingDealers.co.za This test is not yet approved or cleared by the [place] FDA and has been authorized for detection and/or diagnosis of SARS-CoV-2 by FDA under an Emergency Use Authorization (EUA).  This EUA will remain in effect (meaning this test can be used) for the duration of the COVID-19 declaration under Section 564(b)(1) of the Act, 21 U.S.C. section 360bbb-3(b)(1), unless the authorization is terminated or revoked sooner. Performed at [facility], [place] [address], [place], [place] [address]      Labs: BNP (last 3 results) Recent Labs    07/06/18 1114 07/22/18 1711 08/06/18 1008  BNP 724.5* 793.6* 962.2*   Basic Metabolic Panel: Recent Labs  Lab 08/08/18 0420 08/09/18 0623 08/10/18 1323 08/11/18 1136 08/12/18 0622  NA 124* 127* 127* 128* 126*  K 4.6 4.4 4.4 4.1 4.0  CL 93* 88* 90* 94* 91*  CO2 22 25 24 24 25   GLUCOSE 102* 98 86 108* 103*  BUN 20 17 16 15 14   CREATININE 0.95 0.91 1.04 0.81 0.88  CALCIUM 8.6* 8.9 8.6* 8.2* 8.1*  MG  --   --   --  2.1  --    Liver Function Tests: No results for input(s): AST, ALT, ALKPHOS, BILITOT, PROT, ALBUMIN in the last 168 hours. No results for input(s): LIPASE, AMYLASE in the last 168 hours. No results for input(s): AMMONIA in the last 168 hours. CBC: Recent Labs  Lab 08/08/18 0420 08/10/18 1323 08/11/18 1136 08/13/18 0441 08/14/18 0515  WBC 13.0* 10.8* 10.0 8.3 8.3  NEUTROABS 12.2*  --  8.2*  --   --   HGB 10.7* 11.8* 10.7* 10.0* 10.9*  HCT 31.2* 35.8* 32.2* 30.4* 32.8*  MCV 88.6 93.0 91.5 91.6 90.6  PLT 229 231 222 235 261   Cardiac Enzymes: No results for input(s): CKTOTAL, CKMB, CKMBINDEX,  TROPONINI in the last 168 hours. BNP: Invalid input(s): POCBNP CBG: No results for input(s): GLUCAP in the last 168 hours. D-Dimer No results for input(s): DDIMER in the last 72 hours. Hgb A1c No results for input(s): HGBA1C in the last 72 hours. Lipid Profile No results for input(s): CHOL, HDL, LDLCALC, TRIG, CHOLHDL, LDLDIRECT in the last 72 hours. Thyroid function studies No results for input(s): TSH, T4TOTAL, T3FREE, THYROIDAB in the last 72 hours.  Invalid input(s): FREET3  Anemia work up No results for input(s): VITAMINB12, FOLATE, FERRITIN, TIBC, IRON, RETICCTPCT in the last 72 hours. Urinalysis    Component Value Date/Time   COLORURINE YELLOW 07/23/2018 0249   APPEARANCEUR CLEAR 07/23/2018 0249   LABSPEC 1.010 07/23/2018 0249   LABSPEC 1.010 11/16/2006 1032   PHURINE 6.5 07/23/2018 0249   GLUCOSEU NEGATIVE 07/23/2018 0249   HGBUR NEGATIVE 07/23/2018 0249   BILIRUBINUR NEGATIVE 07/23/2018 0249   BILIRUBINUR Negative 11/16/2006 1032   KETONESUR 15 (A) 07/23/2018 0249   PROTEINUR NEGATIVE 07/23/2018 0249   UROBILINOGEN 0.2 11/25/2014 0510   NITRITE NEGATIVE 07/23/2018 0249   LEUKOCYTESUR NEGATIVE 07/23/2018 0249   LEUKOCYTESUR Trace 11/16/2006 1032   Sepsis Labs Invalid input(s): PROCALCITONIN,  WBC,  LACTICIDVEN Microbiology Recent Results (from the past 240 hour(s))  SARS Coronavirus 2 (CEPHEID- Performed in [place] hospital lab), Hosp Order     Status: None   Collection Time: 08/06/18 10:41 AM  Result Value Ref Range Status   SARS Coronavirus 2 NEGATIVE NEGATIVE Final    Comment: (NOTE) If result is NEGATIVE SARS-CoV-2 target nucleic acids are NOT DETECTED. The SARS-CoV-2 RNA is generally detectable in upper and lower  respiratory specimens during the acute phase of infection. The lowest  concentration of SARS-CoV-2 viral copies this assay can detect is 250  copies / mL. A negative result does not preclude SARS-CoV-2 infection  and should not be used as  the sole basis for treatment or other  patient management decisions.  A negative result may occur with  improper specimen collection / handling, submission of specimen other  than nasopharyngeal swab, presence of viral mutation(s) within the  areas targeted by this assay, and inadequate number of viral copies  (<250 copies / mL). A negative result must be combined with clinical  observations, patient history, and epidemiological information. If result is POSITIVE SARS-CoV-2 target nucleic acids are DETECTED. The SARS-CoV-2 RNA is generally detectable in upper and lower  respiratory specimens dur ing the acute phase of infection.  Positive  results are indicative of active infection with SARS-CoV-2.  Clinical  correlation with patient history and other diagnostic information is  necessary to determine patient infection status.  Positive results do  not rule out bacterial infection or co-infection with other viruses. If result is PRESUMPTIVE POSTIVE SARS-CoV-2 nucleic acids MAY BE PRESENT.   A presumptive positive result was obtained on the submitted specimen  and confirmed on repeat testing.  While 2019 novel coronavirus  (SARS-CoV-2) nucleic acids may be present in the submitted sample  additional confirmatory testing may be necessary for epidemiological  and / or clinical management purposes  to differentiate between  SARS-CoV-2 and other Sarbecovirus currently known to infect humans.  If clinically indicated additional testing with an alternate test  methodology [phone]) is advised. The SARS-CoV-2 RNA is generally  detectable in upper and lower respiratory sp ecimens during the acute  phase of infection. The expected result is Negative. Fact Sheet for Patients:  StrictlyIdeas.no Fact Sheet for Healthcare Providers: BankingDealers.co.za This test is not yet approved or cleared by the [place] FDA and has been authorized for  detection and/or diagnosis of SARS-CoV-2 by FDA under an Emergency Use Authorization (EUA).  This EUA will remain in effect (meaning this test can be used) for the duration of the COVID-19 declaration under Section 564(b)(1) of the Act, 21 U.S.C. section 360bbb-3(b)(1), unless the authorization is terminated or revoked sooner. Performed at [facility], [place] [address] [place], [place] [address]  SARS Coronavirus 2 (CEPHEID - Performed in [place] hospital lab), Hosp Order     Status: None   Collection Time: 08/10/18  4:11 PM  Result Value Ref Range Status   SARS Coronavirus 2 NEGATIVE NEGATIVE Final    Comment: (NOTE) If result is NEGATIVE SARS-CoV-2 target nucleic acids are NOT DETECTED. The SARS-CoV-2 RNA is generally detectable in upper and lower  respiratory specimens during the acute phase of infection. The lowest  concentration of SARS-CoV-2 viral copies this assay can detect is 250  copies / mL. A negative result does not preclude SARS-CoV-2 infection  and should not be used as the sole basis for treatment or other  patient management decisions.  A negative result may occur with  improper specimen collection / handling, submission of specimen other  than nasopharyngeal swab, presence of viral mutation(s) within the  areas targeted by this assay, and inadequate number of viral copies  (<250 copies / mL). A negative result must be combined with clinical  observations, patient history, and epidemiological information. If result is POSITIVE SARS-CoV-2 target nucleic acids are DETECTED. The SARS-CoV-2 RNA is generally detectable in upper and lower  respiratory specimens dur ing the acute phase of infection.  Positive  results are indicative of active infection with SARS-CoV-2.  Clinical  correlation with patient history and other diagnostic information is  necessary to determine patient infection status.  Positive results do  not rule out bacterial infection or  co-infection with other viruses. If result is PRESUMPTIVE POSTIVE SARS-CoV-2 nucleic acids MAY BE PRESENT.   A presumptive positive result was obtained on the submitted specimen  and confirmed on repeat testing.  While 2019 novel coronavirus  (SARS-CoV-2) nucleic acids may be present in the submitted sample  additional confirmatory testing may be necessary for epidemiological  and / or clinical management purposes  to differentiate between  SARS-CoV-2 and other Sarbecovirus currently known to infect humans.  If clinically indicated additional testing with an alternate test  methodology [phone]) is advised. The SARS-CoV-2 RNA is generally  detectable in upper and lower respiratory sp ecimens during the acute  phase of infection. The expected result is Negative. Fact Sheet for Patients:  StrictlyIdeas.no Fact Sheet for Healthcare Providers: BankingDealers.co.za This test is not yet approved or cleared by the [place] FDA and has been authorized for detection and/or diagnosis of SARS-CoV-2 by FDA under an Emergency Use Authorization (EUA).  This EUA will remain in effect (meaning this test can be used) for the duration of the COVID-19 declaration under Section 564(b)(1) of the Act, 21 U.S.C. section 360bbb-3(b)(1), unless the authorization is terminated or revoked sooner. Performed at [facility], [place] [address] [place], [place] [address]      Time coordinating discharge: 35 minutes  SIGNED:   Aline August, MD  [facility] 08/14/2018, 10:27 AM

## 2018-08-16 ENCOUNTER — Ambulatory Visit (INDEPENDENT_AMBULATORY_CARE_PROVIDER_SITE_OTHER): Admitting: Cardiology

## 2018-08-16 ENCOUNTER — Telehealth: Payer: Self-pay | Admitting: Cardiovascular Disease

## 2018-08-16 DIAGNOSIS — Z7189 Other specified counseling: Secondary | ICD-10-CM

## 2018-08-16 DIAGNOSIS — I482 Chronic atrial fibrillation, unspecified: Secondary | ICD-10-CM | POA: Diagnosis not present

## 2018-08-16 LAB — POCT INR: INR: 2.1 (ref 2.0–3.0)

## 2018-08-16 NOTE — Telephone Encounter (Signed)
I spoke to [facility] from [facility] who reported PT/INR of 2.1.  Informed Coumadin Clinic

## 2018-08-16 NOTE — Patient Instructions (Signed)
Description   Spoke to [facility] from hospice, advised for pt to take 2 tablets today (10mg ) and then continue taking 5mg  daily except 7.5mg  on Fridays. Pt started Ensure 2 cans daily after most recent admission. Recheck INR  08/21/2018. Makela stated she would update pt and family.  Coumadin Clinic [phone]

## 2018-08-16 NOTE — Telephone Encounter (Signed)
PT/INR 2.1 reported by [facility] from [facility].

## 2018-08-16 NOTE — Telephone Encounter (Signed)
Makeala's phone # [phone].  Thank you

## 2018-08-16 NOTE — Telephone Encounter (Signed)
Please refer to anti-coag encounter from today.

## 2018-08-16 NOTE — Telephone Encounter (Signed)
  Coumadin to report

## 2018-08-19 ENCOUNTER — Other Ambulatory Visit: Payer: Self-pay

## 2018-08-19 NOTE — Patient Outreach (Signed)
[facility]) Care Management  08/19/2018  [name] June 08, 1932 [id number]   EMMI- General Discharge RED ON EMMI ALERT Day # 7 Date: 08/16/2018 Red Alert Reason:  Feeling better overall? No  Been confused? Yes  Lost interest in things they used to enjoy? Yes  Sad, hopeless, or empty? Yes    Patient currently active with hospice services.     Plan: RN CM will close case and have EMMI's deactivated.    Jone Baseman, RN, MSN [facility] Care Management Care Management Coordinator Direct Line [phone] Toll Free: [phone]  Fax: [phone]

## 2018-08-20 ENCOUNTER — Encounter: Payer: Medicare Other | Admitting: *Deleted

## 2018-08-21 ENCOUNTER — Telehealth: Payer: Self-pay

## 2018-08-21 NOTE — Telephone Encounter (Signed)
Left message for patient to remind of missed remote transmission.  

## 2018-08-26 ENCOUNTER — Telehealth: Payer: Self-pay | Admitting: Internal Medicine

## 2018-08-26 NOTE — Telephone Encounter (Signed)
Wife of patient called and would like information about her husband's device. She states her Husband passed away on 2022/09/02.

## 2018-08-26 NOTE — Telephone Encounter (Signed)
Pt wife called stating the pt passed away 2018-08-22.

## 2018-09-11 DEATH — deceased

## 2019-11-02 IMAGING — CR PORTABLE CHEST - 1 VIEW
1 series · 1 of 1 positions shown · non-contrast
Comparison: Radiograph July 22, 2018.

CLINICAL DATA: Shortness of breath.

EXAM:
PORTABLE CHEST 1 VIEW

[AP]
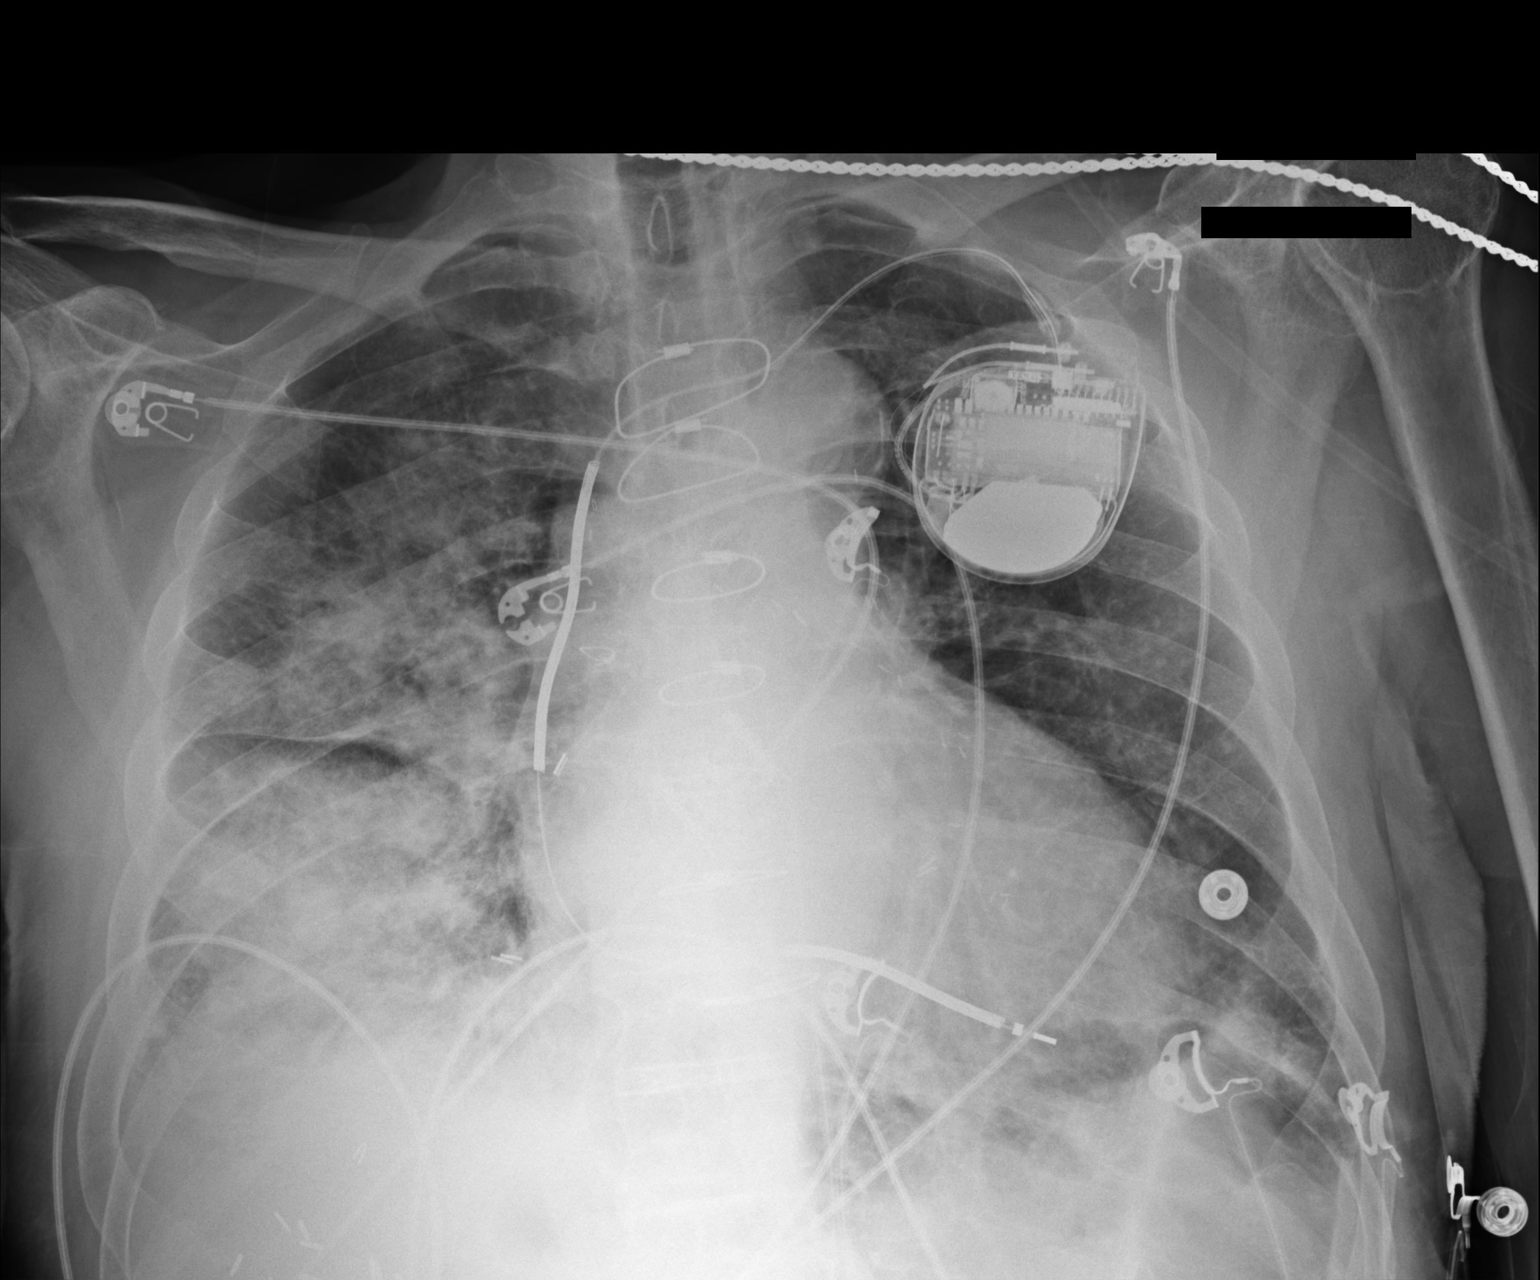

[1 of 1 positions shown; findings below may reference images not displayed]

FINDINGS: Stable cardiomediastinal silhouette. Sternotomy wires are noted.
Left-sided pacemaker is unchanged in position. No pneumothorax is
noted. Left lung is clear. New large airspace opacity is noted in
the right lung concerning for pneumonia. Minimal right pleural
effusion may be present. Bony thorax is unremarkable.
IMPRESSION: Interval placement of large airspace opacity in right lung
concerning for pneumonia. Followup PA and lateral chest X-ray is
recommended in 3-4 weeks following trial of antibiotic therapy to
ensure resolution and exclude underlying malignancy.
